# Patient Record
Sex: Female | Born: 1940
Health system: Southern US, Community
[De-identification: ages and names within clinical notes are randomized; demographics above are authoritative.]

## PROBLEM LIST (undated history)

## (undated) DIAGNOSIS — I781 Nevus, non-neoplastic: Secondary | ICD-10-CM

## (undated) DIAGNOSIS — G629 Polyneuropathy, unspecified: Secondary | ICD-10-CM

## (undated) DIAGNOSIS — M069 Rheumatoid arthritis, unspecified: Secondary | ICD-10-CM

## (undated) DIAGNOSIS — D649 Anemia, unspecified: Secondary | ICD-10-CM

## (undated) DIAGNOSIS — E119 Type 2 diabetes mellitus without complications: Secondary | ICD-10-CM

## (undated) DIAGNOSIS — N189 Chronic kidney disease, unspecified: Secondary | ICD-10-CM

## (undated) DIAGNOSIS — C801 Malignant (primary) neoplasm, unspecified: Secondary | ICD-10-CM

## (undated) DIAGNOSIS — Z950 Presence of cardiac pacemaker: Secondary | ICD-10-CM

## (undated) DIAGNOSIS — K922 Gastrointestinal hemorrhage, unspecified: Secondary | ICD-10-CM

## (undated) DIAGNOSIS — R159 Full incontinence of feces: Secondary | ICD-10-CM

## (undated) DIAGNOSIS — H409 Unspecified glaucoma: Secondary | ICD-10-CM

## (undated) DIAGNOSIS — I10 Essential (primary) hypertension: Secondary | ICD-10-CM

## (undated) DIAGNOSIS — E785 Hyperlipidemia, unspecified: Secondary | ICD-10-CM

## (undated) DIAGNOSIS — M1990 Unspecified osteoarthritis, unspecified site: Secondary | ICD-10-CM

## (undated) HISTORY — DX: Anemia, unspecified: D64.9

## (undated) HISTORY — PX: CHOLECYSTECTOMY: SHX55

## (undated) HISTORY — PX: COLONOSCOPY: SHX174

## (undated) HISTORY — PX: EXCISIONAL HEMORRHOIDECTOMY: SHX1541

## (undated) HISTORY — PX: BREAST SURGERY: SHX581

## (undated) HISTORY — DX: Gastrointestinal hemorrhage, unspecified: K92.2

---

## 2006-02-27 ENCOUNTER — Inpatient Hospital Stay: Payer: Self-pay

## 2006-02-27 ENCOUNTER — Other Ambulatory Visit: Payer: Self-pay

## 2006-04-18 ENCOUNTER — Ambulatory Visit: Payer: Self-pay | Admitting: Gastroenterology

## 2008-12-03 ENCOUNTER — Ambulatory Visit: Payer: Self-pay | Admitting: Rheumatology

## 2012-07-17 DIAGNOSIS — Z853 Personal history of malignant neoplasm of breast: Secondary | ICD-10-CM | POA: Insufficient documentation

## 2012-10-29 ENCOUNTER — Ambulatory Visit: Payer: Self-pay | Admitting: Family Medicine

## 2012-11-11 ENCOUNTER — Ambulatory Visit: Payer: Self-pay | Admitting: Family Medicine

## 2014-01-22 DIAGNOSIS — E785 Hyperlipidemia, unspecified: Secondary | ICD-10-CM | POA: Insufficient documentation

## 2014-08-04 DIAGNOSIS — E782 Mixed hyperlipidemia: Secondary | ICD-10-CM | POA: Diagnosis not present

## 2014-08-04 DIAGNOSIS — I10 Essential (primary) hypertension: Secondary | ICD-10-CM | POA: Diagnosis not present

## 2014-08-04 DIAGNOSIS — E119 Type 2 diabetes mellitus without complications: Secondary | ICD-10-CM | POA: Insufficient documentation

## 2014-08-04 DIAGNOSIS — E1165 Type 2 diabetes mellitus with hyperglycemia: Secondary | ICD-10-CM | POA: Diagnosis not present

## 2014-08-04 DIAGNOSIS — IMO0002 Reserved for concepts with insufficient information to code with codable children: Secondary | ICD-10-CM | POA: Insufficient documentation

## 2014-08-04 DIAGNOSIS — E1149 Type 2 diabetes mellitus with other diabetic neurological complication: Secondary | ICD-10-CM | POA: Insufficient documentation

## 2014-08-04 DIAGNOSIS — D508 Other iron deficiency anemias: Secondary | ICD-10-CM | POA: Diagnosis not present

## 2014-10-18 DIAGNOSIS — H524 Presbyopia: Secondary | ICD-10-CM | POA: Diagnosis not present

## 2014-10-18 DIAGNOSIS — H5210 Myopia, unspecified eye: Secondary | ICD-10-CM | POA: Diagnosis not present

## 2014-11-05 DIAGNOSIS — I10 Essential (primary) hypertension: Secondary | ICD-10-CM | POA: Diagnosis not present

## 2014-11-05 DIAGNOSIS — E1149 Type 2 diabetes mellitus with other diabetic neurological complication: Secondary | ICD-10-CM | POA: Diagnosis not present

## 2014-11-05 DIAGNOSIS — E782 Mixed hyperlipidemia: Secondary | ICD-10-CM | POA: Diagnosis not present

## 2014-11-05 DIAGNOSIS — E1165 Type 2 diabetes mellitus with hyperglycemia: Secondary | ICD-10-CM | POA: Diagnosis not present

## 2014-11-05 DIAGNOSIS — D508 Other iron deficiency anemias: Secondary | ICD-10-CM | POA: Diagnosis not present

## 2014-11-10 DIAGNOSIS — L089 Local infection of the skin and subcutaneous tissue, unspecified: Secondary | ICD-10-CM | POA: Diagnosis not present

## 2014-11-10 DIAGNOSIS — L308 Other specified dermatitis: Secondary | ICD-10-CM | POA: Diagnosis not present

## 2014-11-24 DIAGNOSIS — L918 Other hypertrophic disorders of the skin: Secondary | ICD-10-CM | POA: Diagnosis not present

## 2014-11-24 DIAGNOSIS — L2089 Other atopic dermatitis: Secondary | ICD-10-CM | POA: Diagnosis not present

## 2015-02-15 DIAGNOSIS — E782 Mixed hyperlipidemia: Secondary | ICD-10-CM | POA: Diagnosis not present

## 2015-02-15 DIAGNOSIS — D508 Other iron deficiency anemias: Secondary | ICD-10-CM | POA: Diagnosis not present

## 2015-02-15 DIAGNOSIS — I10 Essential (primary) hypertension: Secondary | ICD-10-CM | POA: Diagnosis not present

## 2015-02-15 DIAGNOSIS — E1165 Type 2 diabetes mellitus with hyperglycemia: Secondary | ICD-10-CM | POA: Diagnosis not present

## 2015-02-15 DIAGNOSIS — E1149 Type 2 diabetes mellitus with other diabetic neurological complication: Secondary | ICD-10-CM | POA: Diagnosis not present

## 2015-02-21 DIAGNOSIS — Z1283 Encounter for screening for malignant neoplasm of skin: Secondary | ICD-10-CM | POA: Diagnosis not present

## 2015-02-21 DIAGNOSIS — Q808 Other congenital ichthyosis: Secondary | ICD-10-CM | POA: Diagnosis not present

## 2015-02-28 DIAGNOSIS — D508 Other iron deficiency anemias: Secondary | ICD-10-CM | POA: Diagnosis not present

## 2015-03-28 DIAGNOSIS — Z4431 Encounter for fitting and adjustment of external right breast prosthesis: Secondary | ICD-10-CM | POA: Diagnosis not present

## 2015-03-28 DIAGNOSIS — C50111 Malignant neoplasm of central portion of right female breast: Secondary | ICD-10-CM | POA: Diagnosis not present

## 2015-04-11 DIAGNOSIS — D649 Anemia, unspecified: Secondary | ICD-10-CM | POA: Diagnosis not present

## 2015-05-18 DIAGNOSIS — D508 Other iron deficiency anemias: Secondary | ICD-10-CM | POA: Diagnosis not present

## 2015-05-18 DIAGNOSIS — Z794 Long term (current) use of insulin: Secondary | ICD-10-CM | POA: Diagnosis not present

## 2015-05-18 DIAGNOSIS — E782 Mixed hyperlipidemia: Secondary | ICD-10-CM | POA: Diagnosis not present

## 2015-05-18 DIAGNOSIS — Z23 Encounter for immunization: Secondary | ICD-10-CM | POA: Diagnosis not present

## 2015-05-18 DIAGNOSIS — E1142 Type 2 diabetes mellitus with diabetic polyneuropathy: Secondary | ICD-10-CM | POA: Diagnosis not present

## 2015-05-18 DIAGNOSIS — I10 Essential (primary) hypertension: Secondary | ICD-10-CM | POA: Diagnosis not present

## 2015-05-26 DIAGNOSIS — D508 Other iron deficiency anemias: Secondary | ICD-10-CM | POA: Diagnosis not present

## 2015-08-05 DIAGNOSIS — L309 Dermatitis, unspecified: Secondary | ICD-10-CM | POA: Diagnosis not present

## 2015-08-18 DIAGNOSIS — Z794 Long term (current) use of insulin: Secondary | ICD-10-CM | POA: Diagnosis not present

## 2015-08-18 DIAGNOSIS — I10 Essential (primary) hypertension: Secondary | ICD-10-CM | POA: Diagnosis not present

## 2015-08-18 DIAGNOSIS — E782 Mixed hyperlipidemia: Secondary | ICD-10-CM | POA: Diagnosis not present

## 2015-08-18 DIAGNOSIS — L299 Pruritus, unspecified: Secondary | ICD-10-CM | POA: Diagnosis not present

## 2015-08-18 DIAGNOSIS — E1142 Type 2 diabetes mellitus with diabetic polyneuropathy: Secondary | ICD-10-CM | POA: Diagnosis not present

## 2015-08-18 DIAGNOSIS — D508 Other iron deficiency anemias: Secondary | ICD-10-CM | POA: Diagnosis not present

## 2015-08-22 DIAGNOSIS — L299 Pruritus, unspecified: Secondary | ICD-10-CM | POA: Diagnosis not present

## 2015-08-22 DIAGNOSIS — Z794 Long term (current) use of insulin: Secondary | ICD-10-CM | POA: Diagnosis not present

## 2015-08-22 DIAGNOSIS — E1142 Type 2 diabetes mellitus with diabetic polyneuropathy: Secondary | ICD-10-CM | POA: Diagnosis not present

## 2015-08-22 DIAGNOSIS — E782 Mixed hyperlipidemia: Secondary | ICD-10-CM | POA: Diagnosis not present

## 2015-09-12 DIAGNOSIS — R7989 Other specified abnormal findings of blood chemistry: Secondary | ICD-10-CM | POA: Diagnosis not present

## 2015-09-19 DIAGNOSIS — Q800 Ichthyosis vulgaris: Secondary | ICD-10-CM | POA: Diagnosis not present

## 2015-10-19 DIAGNOSIS — H5213 Myopia, bilateral: Secondary | ICD-10-CM | POA: Diagnosis not present

## 2015-10-19 DIAGNOSIS — H5210 Myopia, unspecified eye: Secondary | ICD-10-CM | POA: Diagnosis not present

## 2015-11-21 DIAGNOSIS — I10 Essential (primary) hypertension: Secondary | ICD-10-CM | POA: Diagnosis not present

## 2015-11-21 DIAGNOSIS — E1142 Type 2 diabetes mellitus with diabetic polyneuropathy: Secondary | ICD-10-CM | POA: Diagnosis not present

## 2015-11-21 DIAGNOSIS — E782 Mixed hyperlipidemia: Secondary | ICD-10-CM | POA: Diagnosis not present

## 2015-11-21 DIAGNOSIS — L821 Other seborrheic keratosis: Secondary | ICD-10-CM | POA: Diagnosis not present

## 2015-11-21 DIAGNOSIS — D508 Other iron deficiency anemias: Secondary | ICD-10-CM | POA: Diagnosis not present

## 2015-11-21 DIAGNOSIS — Q800 Ichthyosis vulgaris: Secondary | ICD-10-CM | POA: Diagnosis not present

## 2015-11-21 DIAGNOSIS — L299 Pruritus, unspecified: Secondary | ICD-10-CM | POA: Diagnosis not present

## 2015-11-21 DIAGNOSIS — Z794 Long term (current) use of insulin: Secondary | ICD-10-CM | POA: Diagnosis not present

## 2015-12-19 DIAGNOSIS — Q800 Ichthyosis vulgaris: Secondary | ICD-10-CM | POA: Diagnosis not present

## 2015-12-28 DIAGNOSIS — Z4431 Encounter for fitting and adjustment of external right breast prosthesis: Secondary | ICD-10-CM | POA: Diagnosis not present

## 2015-12-28 DIAGNOSIS — C50111 Malignant neoplasm of central portion of right female breast: Secondary | ICD-10-CM | POA: Diagnosis not present

## 2016-02-21 DIAGNOSIS — L918 Other hypertrophic disorders of the skin: Secondary | ICD-10-CM | POA: Diagnosis not present

## 2016-02-21 DIAGNOSIS — L853 Xerosis cutis: Secondary | ICD-10-CM | POA: Diagnosis not present

## 2016-02-21 DIAGNOSIS — Z1283 Encounter for screening for malignant neoplasm of skin: Secondary | ICD-10-CM | POA: Diagnosis not present

## 2016-02-23 DIAGNOSIS — L299 Pruritus, unspecified: Secondary | ICD-10-CM | POA: Diagnosis not present

## 2016-02-23 DIAGNOSIS — E1142 Type 2 diabetes mellitus with diabetic polyneuropathy: Secondary | ICD-10-CM | POA: Diagnosis not present

## 2016-02-23 DIAGNOSIS — I10 Essential (primary) hypertension: Secondary | ICD-10-CM | POA: Diagnosis not present

## 2016-02-23 DIAGNOSIS — M7582 Other shoulder lesions, left shoulder: Secondary | ICD-10-CM | POA: Diagnosis not present

## 2016-02-23 DIAGNOSIS — D508 Other iron deficiency anemias: Secondary | ICD-10-CM | POA: Diagnosis not present

## 2016-02-23 DIAGNOSIS — E782 Mixed hyperlipidemia: Secondary | ICD-10-CM | POA: Diagnosis not present

## 2016-02-23 DIAGNOSIS — Z794 Long term (current) use of insulin: Secondary | ICD-10-CM | POA: Diagnosis not present

## 2016-03-07 DIAGNOSIS — Z794 Long term (current) use of insulin: Secondary | ICD-10-CM | POA: Diagnosis not present

## 2016-03-07 DIAGNOSIS — D508 Other iron deficiency anemias: Secondary | ICD-10-CM | POA: Diagnosis not present

## 2016-03-07 DIAGNOSIS — E1165 Type 2 diabetes mellitus with hyperglycemia: Secondary | ICD-10-CM | POA: Diagnosis not present

## 2016-04-06 DIAGNOSIS — D509 Iron deficiency anemia, unspecified: Secondary | ICD-10-CM | POA: Diagnosis not present

## 2016-04-06 DIAGNOSIS — Z23 Encounter for immunization: Secondary | ICD-10-CM | POA: Diagnosis not present

## 2016-04-06 DIAGNOSIS — E1165 Type 2 diabetes mellitus with hyperglycemia: Secondary | ICD-10-CM | POA: Diagnosis not present

## 2016-04-06 DIAGNOSIS — Z794 Long term (current) use of insulin: Secondary | ICD-10-CM | POA: Diagnosis not present

## 2016-04-11 NOTE — Progress Notes (Signed)
[place]  [phone] [phone]  ID: [name] OB: Jan 22, 1941  MR#: [id number]  KMQ#:[id number]  No care team member to display  CHIEF COMPLAINT: Iron deficiency anemia.  INTERVAL HISTORY: Patient is a 75 year old female who was noted to have a declining hemoglobin as well as iron stores on routine blood work. She also has a distant history of breast cancer treated with chemotherapy at [facility] in the late 1990s. Currently, she is weak and fatigued but otherwise feels well. She has no neurologic complaints. She denies any recent fevers or illnesses. She has a fair appetite, but denies weight loss. She has no chest pain or shortness of breath. She denies any nausea, vomiting, constipation, or diarrhea. She has no melena or hematochezia. She has no urinary complaints. Patient offers no further specific complaints today.  REVIEW OF SYSTEMS:   Review of Systems  Constitutional: Positive for malaise/fatigue. Negative for fever and weight loss.  Respiratory: Negative.  Negative for cough and shortness of breath.   Cardiovascular: Negative.  Negative for chest pain and leg swelling.  Gastrointestinal: Negative.  Negative for abdominal pain.  Genitourinary: Negative.   Musculoskeletal: Negative.   Neurological: Positive for weakness.  Psychiatric/Behavioral: Negative.  The patient is not nervous/anxious.    As per HPI. Otherwise, a complete review of systems is negative.  PAST MEDICAL HISTORY: No past medical history on file.  PAST SURGICAL HISTORY: No past surgical history on file.  FAMILY HISTORY: Reviewed and unchanged. No reported history of malignancy or chronic disease.  ADVANCED DIRECTIVES (Y/N):  N  HEALTH MAINTENANCE: Social History  Substance Use Topics  . Smoking status: Not on file  . Smokeless tobacco: Not on file  . Alcohol use Not on file     Colonoscopy:  PAP:  Bone density:  Lipid panel:  No Known  Allergies  Current Outpatient Prescriptions  Medication Sig Dispense Refill  . aspirin 81 MG EC tablet Take 81 mg by mouth daily. Swallow whole.    . enalapril (VASOTEC) 20 MG tablet Take 20 mg by mouth daily.    . Ergocalciferol (VITAMIN D2) 2000 units TABS Take 1 tablet by mouth daily.    . ferrous fumarate (HEMOCYTE - 106 MG FE) 325 (106 Fe) MG TABS tablet Take 1 tablet by mouth daily.    Marland Kitchen gabapentin (NEURONTIN) 300 MG capsule Take 600 mg by mouth 3 (three) times daily.    Marland Kitchen glipiZIDE (GLUCOTROL) 10 MG tablet Take 10 mg by mouth daily before breakfast.    . glipiZIDE (GLUCOTROL) 5 MG tablet Take 5 mg by mouth every evening.    . hydrOXYzine (ATARAX/VISTARIL) 25 MG tablet Take 25 mg by mouth 3 (three) times daily as needed. Take 1-2 tabs at bedtime.    . insulin glargine (LANTUS) 100 UNIT/ML injection Inject 20-22 Units into the skin daily.    Marland Kitchen loratadine (CLARITIN) 10 MG tablet Take 10 mg by mouth daily.    . metFORMIN (GLUCOPHAGE) 850 MG tablet Take 850 mg by mouth 3 (three) times daily.    . niacin 500 MG CR capsule Take 500 mg by mouth at bedtime.    . Omega-3 Fatty Acids (FISH OIL PO) Take 1 tablet by mouth.    . simvastatin (ZOCOR) 40 MG tablet Take 40 mg by mouth daily at 6 PM.    . sitaGLIPtin (JANUVIA) 50 MG tablet Take 50 mg by mouth daily.    . travoprost, benzalkonium, (TRAVATAN) 0.004 % ophthalmic solution 1 drop  at bedtime.    . vitamin B-12 (CYANOCOBALAMIN) 1000 MCG tablet Take 1,000 mcg by mouth daily.     No current facility-administered medications for this visit.     OBJECTIVE: Vitals:   04/13/16 0921  BP: (!) 148/71  Pulse: (!) 103  Resp: 18  Temp: 98 F (36.7 C)     Body mass index is 29.28 kg/m.    ECOG FS:1 - Symptomatic but completely ambulatory  General: Well-developed, well-nourished, no acute distress. Eyes: Pink conjunctiva, anicteric sclera. HEENT: Normocephalic, moist mucous membranes, clear oropharnyx. Lungs: Clear to auscultation  bilaterally. Heart: Regular rate and rhythm. No rubs, murmurs, or gallops. Abdomen: Soft, nontender, nondistended. No organomegaly noted, normoactive bowel sounds. Musculoskeletal: No edema, cyanosis, or clubbing. Neuro: Alert, answering all questions appropriately. Cranial nerves grossly intact. Skin: No rashes or petechiae noted. Psych: Normal affect. Lymphatics: No cervical, calvicular, axillary or inguinal LAD.   LAB RESULTS:  No results found for: NA, K, CL, CO2, GLUCOSE, BUN, CREATININE, CALCIUM, PROT, ALBUMIN, AST, ALT, ALKPHOS, BILITOT, GFRNONAA, GFRAA  No results found for: WBC, NEUTROABS, HGB, HCT, MCV, PLT   STUDIES: No results found.  ASSESSMENT: Iron deficiency anemia.  PLAN:    1. Iron deficiency anemia: Patient's most recent laboratory work on April 06, 2016 reported a hemoglobin of 7.4, total iron of 11, percent saturation of 2% and a ferritin of 9. Patient is also symptomatic. Return to clinic in 1 and 2 weeks for 510 mg IV Feraheme. Patient will then return to clinic in 3 months for repeat laboratory work and further evaluation. Will also do a more extensive anemia workup at that time. 2. Hypertension: Patient's blood pressure is mildly elevated today. Continue current medications as prescribed.  Patient expressed understanding and was in agreement with this plan. She also understands that She can call clinic at any time with any questions, concerns, or complaints.    Lloyd Huger, MD   04/15/2016 9:08 AM

## 2016-04-13 ENCOUNTER — Inpatient Hospital Stay: Payer: Commercial Managed Care - HMO | Attending: Oncology | Admitting: Oncology

## 2016-04-13 VITALS — BP 148/71 | HR 103 | Temp 98.0°F | Resp 18 | Ht 64.5 in | Wt 173.3 lb

## 2016-04-13 DIAGNOSIS — D509 Iron deficiency anemia, unspecified: Secondary | ICD-10-CM | POA: Diagnosis not present

## 2016-04-13 DIAGNOSIS — Z853 Personal history of malignant neoplasm of breast: Secondary | ICD-10-CM | POA: Diagnosis not present

## 2016-04-13 DIAGNOSIS — I10 Essential (primary) hypertension: Secondary | ICD-10-CM | POA: Diagnosis not present

## 2016-04-13 DIAGNOSIS — Z79899 Other long term (current) drug therapy: Secondary | ICD-10-CM | POA: Diagnosis not present

## 2016-04-13 DIAGNOSIS — Z7982 Long term (current) use of aspirin: Secondary | ICD-10-CM | POA: Insufficient documentation

## 2016-04-13 DIAGNOSIS — D508 Other iron deficiency anemias: Secondary | ICD-10-CM

## 2016-04-13 DIAGNOSIS — D649 Anemia, unspecified: Secondary | ICD-10-CM

## 2016-04-13 NOTE — Progress Notes (Signed)
New evaluation for anemia. States is feeling tired with decreased energy levels.

## 2016-04-15 DIAGNOSIS — D509 Iron deficiency anemia, unspecified: Secondary | ICD-10-CM | POA: Insufficient documentation

## 2016-04-19 ENCOUNTER — Inpatient Hospital Stay: Payer: Commercial Managed Care - HMO

## 2016-04-19 ENCOUNTER — Other Ambulatory Visit: Payer: Self-pay | Admitting: Oncology

## 2016-04-19 VITALS — BP 113/63 | HR 94 | Temp 98.1°F | Resp 20

## 2016-04-19 DIAGNOSIS — Z7982 Long term (current) use of aspirin: Secondary | ICD-10-CM | POA: Diagnosis not present

## 2016-04-19 DIAGNOSIS — I10 Essential (primary) hypertension: Secondary | ICD-10-CM | POA: Diagnosis not present

## 2016-04-19 DIAGNOSIS — D508 Other iron deficiency anemias: Secondary | ICD-10-CM

## 2016-04-19 DIAGNOSIS — Z853 Personal history of malignant neoplasm of breast: Secondary | ICD-10-CM | POA: Diagnosis not present

## 2016-04-19 DIAGNOSIS — D509 Iron deficiency anemia, unspecified: Secondary | ICD-10-CM | POA: Diagnosis not present

## 2016-04-19 DIAGNOSIS — Z79899 Other long term (current) drug therapy: Secondary | ICD-10-CM | POA: Diagnosis not present

## 2016-04-19 MED ORDER — SODIUM CHLORIDE 0.9 % IV SOLN
510.0000 mg | Freq: Once | INTRAVENOUS | Status: AC
  Administered 2016-04-19: 510 mg via INTRAVENOUS
  Filled 2016-04-19: qty 17

## 2016-04-19 MED ORDER — SODIUM CHLORIDE 0.9 % IV SOLN
Freq: Once | INTRAVENOUS | Status: AC
  Administered 2016-04-19: 12:00:00 via INTRAVENOUS
  Filled 2016-04-19: qty 1000

## 2016-04-26 ENCOUNTER — Inpatient Hospital Stay: Payer: Commercial Managed Care - HMO

## 2016-04-26 VITALS — BP 125/67 | HR 92 | Temp 98.2°F | Resp 20

## 2016-04-26 DIAGNOSIS — I10 Essential (primary) hypertension: Secondary | ICD-10-CM | POA: Diagnosis not present

## 2016-04-26 DIAGNOSIS — D509 Iron deficiency anemia, unspecified: Secondary | ICD-10-CM | POA: Diagnosis not present

## 2016-04-26 DIAGNOSIS — D508 Other iron deficiency anemias: Secondary | ICD-10-CM

## 2016-04-26 DIAGNOSIS — Z79899 Other long term (current) drug therapy: Secondary | ICD-10-CM | POA: Diagnosis not present

## 2016-04-26 DIAGNOSIS — Z853 Personal history of malignant neoplasm of breast: Secondary | ICD-10-CM | POA: Diagnosis not present

## 2016-04-26 DIAGNOSIS — Z7982 Long term (current) use of aspirin: Secondary | ICD-10-CM | POA: Diagnosis not present

## 2016-04-26 MED ORDER — FERUMOXYTOL INJECTION 510 MG/17 ML
510.0000 mg | Freq: Once | INTRAVENOUS | Status: AC
  Administered 2016-04-26: 510 mg via INTRAVENOUS
  Filled 2016-04-26: qty 17

## 2016-04-26 MED ORDER — SODIUM CHLORIDE 0.9 % IV SOLN
Freq: Once | INTRAVENOUS | Status: AC
  Administered 2016-04-26: 14:00:00 via INTRAVENOUS
  Filled 2016-04-26: qty 1000

## 2016-04-26 NOTE — Patient Instructions (Signed)

## 2016-05-25 DIAGNOSIS — E782 Mixed hyperlipidemia: Secondary | ICD-10-CM | POA: Diagnosis not present

## 2016-05-25 DIAGNOSIS — I10 Essential (primary) hypertension: Secondary | ICD-10-CM | POA: Diagnosis not present

## 2016-05-25 DIAGNOSIS — Z794 Long term (current) use of insulin: Secondary | ICD-10-CM | POA: Diagnosis not present

## 2016-05-25 DIAGNOSIS — E1165 Type 2 diabetes mellitus with hyperglycemia: Secondary | ICD-10-CM | POA: Diagnosis not present

## 2016-05-25 DIAGNOSIS — D508 Other iron deficiency anemias: Secondary | ICD-10-CM | POA: Diagnosis not present

## 2016-07-11 ENCOUNTER — Inpatient Hospital Stay: Payer: Commercial Managed Care - HMO | Attending: Oncology

## 2016-07-11 DIAGNOSIS — D508 Other iron deficiency anemias: Secondary | ICD-10-CM

## 2016-07-11 DIAGNOSIS — R0602 Shortness of breath: Secondary | ICD-10-CM | POA: Insufficient documentation

## 2016-07-11 DIAGNOSIS — R531 Weakness: Secondary | ICD-10-CM | POA: Insufficient documentation

## 2016-07-11 DIAGNOSIS — D509 Iron deficiency anemia, unspecified: Secondary | ICD-10-CM | POA: Insufficient documentation

## 2016-07-11 DIAGNOSIS — Z79899 Other long term (current) drug therapy: Secondary | ICD-10-CM | POA: Diagnosis not present

## 2016-07-11 LAB — CBC
HEMATOCRIT: 31.8 % — AB (ref 35.0–47.0)
HEMOGLOBIN: 10.2 g/dL — AB (ref 12.0–16.0)
MCH: 32.2 pg (ref 26.0–34.0)
MCHC: 32 g/dL (ref 32.0–36.0)
MCV: 100.5 fL — ABNORMAL HIGH (ref 80.0–100.0)
Platelets: 234 10*3/uL (ref 150–440)
RBC: 3.17 MIL/uL — ABNORMAL LOW (ref 3.80–5.20)
RDW: 17.2 % — ABNORMAL HIGH (ref 11.5–14.5)
WBC: 10.1 10*3/uL (ref 3.6–11.0)

## 2016-07-11 LAB — DAT, POLYSPECIFIC AHG (ARMC ONLY): POLYSPECIFIC AHG TEST: NEGATIVE

## 2016-07-11 LAB — IRON AND TIBC
Iron: 39 ug/dL (ref 28–170)
Saturation Ratios: 11 % (ref 10.4–31.8)
TIBC: 369 ug/dL (ref 250–450)
UIBC: 330 ug/dL

## 2016-07-11 LAB — FERRITIN: FERRITIN: 26 ng/mL (ref 11–307)

## 2016-07-11 LAB — LACTATE DEHYDROGENASE: LDH: 257 U/L — ABNORMAL HIGH (ref 98–192)

## 2016-07-11 LAB — VITAMIN B12: Vitamin B-12: 4134 pg/mL — ABNORMAL HIGH (ref 180–914)

## 2016-07-11 LAB — FOLATE: FOLATE: 8.9 ng/mL (ref >5.9)

## 2016-07-12 LAB — PROTEIN ELECTROPHORESIS, SERUM
A/G RATIO SPE: 0.9 (ref 0.7–1.7)
ALPHA-2-GLOBULIN: 0.7 g/dL (ref 0.4–1.0)
Albumin ELP: 3.3 g/dL (ref 2.9–4.4)
Alpha-1-Globulin: 0.3 g/dL (ref 0.0–0.4)
Beta Globulin: 1.1 g/dL (ref 0.7–1.3)
Gamma Globulin: 1.5 g/dL (ref 0.4–1.8)
Globulin, Total: 3.6 g/dL (ref 2.2–3.9)
PDF SPE: 0
TOTAL PROTEIN ELP: 6.9 g/dL (ref 6.0–8.5)

## 2016-07-12 LAB — HAPTOGLOBIN: HAPTOGLOBIN: 126 mg/dL (ref 34–200)

## 2016-07-12 NOTE — Progress Notes (Signed)
Emily Faulkner  [phone] [phone]  ID: Emily Faulkner OB: Jul 15, 1940  MR#: [id number]  IRS#:[id number]  No care team member to display  CHIEF COMPLAINT: Iron deficiency anemia.  INTERVAL HISTORY: Patient returns to clinic today for repeat laboratory work and further evaluation. Currently, she is weak and fatigued but otherwise feels well, reporting no improvement after recent IV iron supplementation. She reports slight tremors at baseline, no other neurologic complaints. She denies any recent fevers or illnesses. She denies any pain.  Her appetite is reduced and she reports slight weight loss. She reports shortness of breath with exertion but has no chest pain. She reports chronic diarrhea, occasionally taking imodium to control.  She denies any nausea, vomiting, or constipation. She has no melena or hematochezia. She has no urinary complaints. She reports 2 month history of dry skin, thinning hair, feeling cold and sleepy, and being more forgetful, often losing her train of thought. Patient offers no further specific complaints today.  REVIEW OF SYSTEMS:   Review of Systems  Constitutional: Positive for chills, malaise/fatigue and weight loss. Negative for fever.  HENT: Negative for congestion.   Respiratory: Positive for shortness of breath. Negative for cough.   Cardiovascular: Negative.  Negative for chest pain and leg swelling.  Gastrointestinal: Positive for diarrhea. Negative for abdominal pain, blood in stool, constipation, nausea and vomiting.  Genitourinary: Negative.  Negative for dysuria, frequency and urgency.  Musculoskeletal: Negative.  Negative for myalgias.  Neurological: Positive for tremors and weakness. Negative for tingling and headaches.  Psychiatric/Behavioral: Positive for memory loss. The patient is not nervous/anxious and does not have insomnia.    As per HPI. Otherwise, a complete review of systems is negative.  PAST  MEDICAL HISTORY: No past medical history on file.  PAST SURGICAL HISTORY: No past surgical history on file.  FAMILY HISTORY: Reviewed and unchanged. No reported history of malignancy or chronic disease.  ADVANCED DIRECTIVES (Y/N):  N  HEALTH MAINTENANCE: Social History  Substance Use Topics  . Smoking status: Not on file  . Smokeless tobacco: Not on file  . Alcohol use Not on file     Colonoscopy:  PAP:  Bone density:  Lipid panel:  No Known Allergies  Current Outpatient Prescriptions  Medication Sig Dispense Refill  . aspirin 81 MG EC tablet Take 81 mg by mouth daily. Swallow whole.    . enalapril (VASOTEC) 20 MG tablet Take 20 mg by mouth daily.    . Ergocalciferol (VITAMIN D2) 2000 units TABS Take 1 tablet by mouth daily.    . ferrous fumarate (HEMOCYTE - 106 MG FE) 325 (106 Fe) MG TABS tablet Take 1 tablet by mouth daily.    Marland Kitchen gabapentin (NEURONTIN) 300 MG capsule Take 600 mg by mouth 3 (three) times daily.    Marland Kitchen glipiZIDE (GLUCOTROL) 10 MG tablet Take 10 mg by mouth daily before breakfast.    . glipiZIDE (GLUCOTROL) 5 MG tablet Take 5 mg by mouth every evening.    . hydrOXYzine (ATARAX/VISTARIL) 25 MG tablet Take 25 mg by mouth 3 (three) times daily as needed. Take 1-2 tabs at bedtime.    . insulin glargine (LANTUS) 100 UNIT/ML injection Inject 20-22 Units into the skin daily.    Marland Kitchen loratadine (CLARITIN) 10 MG tablet Take 10 mg by mouth daily.    . metFORMIN (GLUCOPHAGE) 850 MG tablet Take 850 mg by mouth 3 (three) times daily.    . niacin 500 MG CR capsule Take 500 mg by mouth  at bedtime.    . Omega-3 Fatty Acids (FISH OIL PO) Take 1 tablet by mouth.    . simvastatin (ZOCOR) 40 MG tablet Take 40 mg by mouth daily at 6 PM.    . sitaGLIPtin (JANUVIA) 50 MG tablet Take 50 mg by mouth daily.    . travoprost, benzalkonium, (TRAVATAN) 0.004 % ophthalmic solution 1 drop at bedtime.    . vitamin B-12 (CYANOCOBALAMIN) 1000 MCG tablet Take 1,000 mcg by mouth daily.     No  current facility-administered medications for this visit.     OBJECTIVE: Vitals:   07/13/16 1055  BP: (!) 125/53  Pulse: (!) 108  Resp: 18  Temp: 98.1 F (36.7 C)     Body mass index is 27.92 kg/m.    ECOG FS:1 - Symptomatic but completely ambulatory  General: Well-developed, well-nourished, no acute distress. Eyes: Pink conjunctiva, anicteric sclera. HEENT: Normocephalic, moist mucous membranes, clear oropharnyx. Lungs: Clear to auscultation bilaterally. Heart: Regular rate and rhythm. No rubs, murmurs, or gallops. Abdomen: Soft, nontender, nondistended. No organomegaly noted, normoactive bowel sounds. Musculoskeletal: No edema, cyanosis, or clubbing. Neuro: Alert, answering all questions appropriately. Cranial nerves grossly intact. Skin: No rashes or petechiae noted. Psych: Normal affect. Lymphatics: No cervical, calvicular, axillary or inguinal LAD.   LAB RESULTS:  No results found for: NA, K, CL, CO2, GLUCOSE, BUN, CREATININE, CALCIUM, PROT, ALBUMIN, AST, ALT, ALKPHOS, BILITOT, GFRNONAA, GFRAA  Lab Results  Component Value Date   WBC 10.1 07/11/2016   HGB 10.2 (L) 07/11/2016   HCT 31.8 (L) 07/11/2016   MCV 100.5 (H) 07/11/2016   PLT 234 07/11/2016   Lab Results  Component Value Date   IRON 39 07/11/2016   TIBC 369 07/11/2016   IRONPCTSAT 11 07/11/2016   Lab Results  Component Value Date   FERRITIN 26 07/11/2016    STUDIES: No results found.  ASSESSMENT: Iron deficiency anemia.  PLAN:    1. Iron deficiency anemia: Currently resolved. Patient does not require additional IV iron today. Return to clinic in 3 months for repeat laboratory work and further evaluation.  2. Hypertension: Currently resolved. Patient's blood pressure is mildly low today. Discussed drinking more fluids.  Continue current medications as prescribed. 3. Fatigue/Hair thinning/Cold intolerance: Draw TSH, thryoid panel labs today, forward results to PCP for follow-up.   Patient  expressed understanding and was in agreement with this plan. She also understands that She can call clinic at any time with any questions, concerns, or complaints.    Lucendia Herrlich, NP   07/13/2016  12:34 PM  Patient was seen and evaluated independently and I agree with the assessment and plan as dictated above. Patient last received IV Feraheme on April 26, 2016.  Lloyd Huger, MD 07/13/16 3:06 PM

## 2016-07-13 ENCOUNTER — Inpatient Hospital Stay: Payer: Commercial Managed Care - HMO

## 2016-07-13 ENCOUNTER — Inpatient Hospital Stay (HOSPITAL_BASED_OUTPATIENT_CLINIC_OR_DEPARTMENT_OTHER): Payer: Commercial Managed Care - HMO | Admitting: Oncology

## 2016-07-13 VITALS — BP 125/53 | HR 108 | Temp 98.1°F | Resp 18 | Wt 165.2 lb

## 2016-07-13 DIAGNOSIS — R0602 Shortness of breath: Secondary | ICD-10-CM

## 2016-07-13 DIAGNOSIS — Z79899 Other long term (current) drug therapy: Secondary | ICD-10-CM

## 2016-07-13 DIAGNOSIS — R531 Weakness: Secondary | ICD-10-CM | POA: Diagnosis not present

## 2016-07-13 DIAGNOSIS — R5383 Other fatigue: Secondary | ICD-10-CM

## 2016-07-13 DIAGNOSIS — D508 Other iron deficiency anemias: Secondary | ICD-10-CM

## 2016-07-13 DIAGNOSIS — D509 Iron deficiency anemia, unspecified: Secondary | ICD-10-CM

## 2016-07-13 NOTE — Progress Notes (Signed)
Complains of continuing to feel weak and fatigued despite 2 doses of iron.

## 2016-07-14 LAB — T4: T4 TOTAL: 6.7 ug/dL (ref 4.5–12.0)

## 2016-07-14 LAB — TSH: TSH: 3.526 u[IU]/mL (ref 0.350–4.500)

## 2016-07-31 ENCOUNTER — Telehealth: Payer: Self-pay | Admitting: *Deleted

## 2016-07-31 NOTE — Telephone Encounter (Signed)
patient requesting results

## 2016-07-31 NOTE — Telephone Encounter (Signed)
Thyroid panel within normal limits.

## 2016-08-01 NOTE — Telephone Encounter (Signed)
Patient informed of normal lab results

## 2016-08-10 ENCOUNTER — Other Ambulatory Visit: Payer: Self-pay | Admitting: Gastroenterology

## 2016-08-10 DIAGNOSIS — R1084 Generalized abdominal pain: Secondary | ICD-10-CM

## 2016-08-10 DIAGNOSIS — D509 Iron deficiency anemia, unspecified: Secondary | ICD-10-CM | POA: Diagnosis not present

## 2016-08-10 DIAGNOSIS — R197 Diarrhea, unspecified: Secondary | ICD-10-CM | POA: Diagnosis not present

## 2016-08-13 ENCOUNTER — Encounter: Payer: Self-pay | Admitting: *Deleted

## 2016-08-14 ENCOUNTER — Ambulatory Visit: Payer: Medicare HMO | Admitting: Anesthesiology

## 2016-08-14 ENCOUNTER — Encounter
Admission: RE | Disposition: A | Discharge status: Home / Self Care | Payer: Self-pay | Source: Ambulatory Visit | Attending: Gastroenterology

## 2016-08-14 ENCOUNTER — Ambulatory Visit
Admission: RE | Admit: 2016-08-14 | Discharge: 2016-08-14 | Disposition: A | Discharge status: Home / Self Care | Payer: Medicare HMO | Source: Ambulatory Visit | Attending: Gastroenterology | Admitting: Gastroenterology

## 2016-08-14 DIAGNOSIS — K2950 Unspecified chronic gastritis without bleeding: Secondary | ICD-10-CM | POA: Diagnosis not present

## 2016-08-14 DIAGNOSIS — K449 Diaphragmatic hernia without obstruction or gangrene: Secondary | ICD-10-CM | POA: Insufficient documentation

## 2016-08-14 DIAGNOSIS — K224 Dyskinesia of esophagus: Secondary | ICD-10-CM | POA: Insufficient documentation

## 2016-08-14 DIAGNOSIS — E538 Deficiency of other specified B group vitamins: Secondary | ICD-10-CM | POA: Insufficient documentation

## 2016-08-14 DIAGNOSIS — D509 Iron deficiency anemia, unspecified: Secondary | ICD-10-CM | POA: Insufficient documentation

## 2016-08-14 DIAGNOSIS — B3781 Candidal esophagitis: Secondary | ICD-10-CM | POA: Insufficient documentation

## 2016-08-14 DIAGNOSIS — K621 Rectal polyp: Secondary | ICD-10-CM | POA: Insufficient documentation

## 2016-08-14 DIAGNOSIS — M069 Rheumatoid arthritis, unspecified: Secondary | ICD-10-CM | POA: Insufficient documentation

## 2016-08-14 DIAGNOSIS — K2980 Duodenitis without bleeding: Secondary | ICD-10-CM | POA: Diagnosis not present

## 2016-08-14 DIAGNOSIS — K222 Esophageal obstruction: Secondary | ICD-10-CM | POA: Insufficient documentation

## 2016-08-14 DIAGNOSIS — K2970 Gastritis, unspecified, without bleeding: Secondary | ICD-10-CM | POA: Diagnosis not present

## 2016-08-14 DIAGNOSIS — K210 Gastro-esophageal reflux disease with esophagitis: Secondary | ICD-10-CM | POA: Diagnosis not present

## 2016-08-14 DIAGNOSIS — K2210 Ulcer of esophagus without bleeding: Secondary | ICD-10-CM | POA: Insufficient documentation

## 2016-08-14 DIAGNOSIS — Z853 Personal history of malignant neoplasm of breast: Secondary | ICD-10-CM | POA: Diagnosis not present

## 2016-08-14 DIAGNOSIS — Q438 Other specified congenital malformations of intestine: Secondary | ICD-10-CM | POA: Diagnosis not present

## 2016-08-14 DIAGNOSIS — E785 Hyperlipidemia, unspecified: Secondary | ICD-10-CM | POA: Insufficient documentation

## 2016-08-14 DIAGNOSIS — R197 Diarrhea, unspecified: Secondary | ICD-10-CM | POA: Diagnosis not present

## 2016-08-14 DIAGNOSIS — I10 Essential (primary) hypertension: Secondary | ICD-10-CM | POA: Insufficient documentation

## 2016-08-14 DIAGNOSIS — E1140 Type 2 diabetes mellitus with diabetic neuropathy, unspecified: Secondary | ICD-10-CM | POA: Diagnosis not present

## 2016-08-14 DIAGNOSIS — Z803 Family history of malignant neoplasm of breast: Secondary | ICD-10-CM | POA: Insufficient documentation

## 2016-08-14 DIAGNOSIS — K5730 Diverticulosis of large intestine without perforation or abscess without bleeding: Secondary | ICD-10-CM | POA: Diagnosis not present

## 2016-08-14 DIAGNOSIS — K208 Other esophagitis: Secondary | ICD-10-CM | POA: Diagnosis not present

## 2016-08-14 DIAGNOSIS — K5790 Diverticulosis of intestine, part unspecified, without perforation or abscess without bleeding: Secondary | ICD-10-CM | POA: Diagnosis not present

## 2016-08-14 DIAGNOSIS — Z833 Family history of diabetes mellitus: Secondary | ICD-10-CM | POA: Insufficient documentation

## 2016-08-14 DIAGNOSIS — K228 Other specified diseases of esophagus: Secondary | ICD-10-CM | POA: Diagnosis not present

## 2016-08-14 DIAGNOSIS — B9681 Helicobacter pylori [H. pylori] as the cause of diseases classified elsewhere: Secondary | ICD-10-CM | POA: Insufficient documentation

## 2016-08-14 DIAGNOSIS — Z800 Family history of malignant neoplasm of digestive organs: Secondary | ICD-10-CM | POA: Diagnosis not present

## 2016-08-14 DIAGNOSIS — K269 Duodenal ulcer, unspecified as acute or chronic, without hemorrhage or perforation: Secondary | ICD-10-CM | POA: Diagnosis not present

## 2016-08-14 DIAGNOSIS — Z7982 Long term (current) use of aspirin: Secondary | ICD-10-CM | POA: Insufficient documentation

## 2016-08-14 DIAGNOSIS — Z87891 Personal history of nicotine dependence: Secondary | ICD-10-CM | POA: Insufficient documentation

## 2016-08-14 DIAGNOSIS — K529 Noninfective gastroenteritis and colitis, unspecified: Secondary | ICD-10-CM | POA: Diagnosis not present

## 2016-08-14 DIAGNOSIS — Z794 Long term (current) use of insulin: Secondary | ICD-10-CM | POA: Insufficient documentation

## 2016-08-14 DIAGNOSIS — K635 Polyp of colon: Secondary | ICD-10-CM | POA: Diagnosis not present

## 2016-08-14 DIAGNOSIS — Z79899 Other long term (current) drug therapy: Secondary | ICD-10-CM | POA: Insufficient documentation

## 2016-08-14 DIAGNOSIS — K2960 Other gastritis without bleeding: Secondary | ICD-10-CM | POA: Diagnosis not present

## 2016-08-14 DIAGNOSIS — Z836 Family history of other diseases of the respiratory system: Secondary | ICD-10-CM | POA: Insufficient documentation

## 2016-08-14 DIAGNOSIS — H409 Unspecified glaucoma: Secondary | ICD-10-CM | POA: Insufficient documentation

## 2016-08-14 DIAGNOSIS — Z9049 Acquired absence of other specified parts of digestive tract: Secondary | ICD-10-CM | POA: Insufficient documentation

## 2016-08-14 DIAGNOSIS — Z801 Family history of malignant neoplasm of trachea, bronchus and lung: Secondary | ICD-10-CM | POA: Insufficient documentation

## 2016-08-14 HISTORY — DX: Malignant (primary) neoplasm, unspecified: C80.1

## 2016-08-14 HISTORY — DX: Type 2 diabetes mellitus without complications: E11.9

## 2016-08-14 HISTORY — DX: Essential (primary) hypertension: I10

## 2016-08-14 HISTORY — DX: Unspecified glaucoma: H40.9

## 2016-08-14 HISTORY — DX: Unspecified osteoarthritis, unspecified site: M19.90

## 2016-08-14 HISTORY — DX: Nevus, non-neoplastic: I78.1

## 2016-08-14 HISTORY — PX: COLONOSCOPY WITH PROPOFOL: SHX5780

## 2016-08-14 HISTORY — DX: Polyneuropathy, unspecified: G62.9

## 2016-08-14 HISTORY — DX: Rheumatoid arthritis, unspecified: M06.9

## 2016-08-14 HISTORY — DX: Hyperlipidemia, unspecified: E78.5

## 2016-08-14 HISTORY — PX: ESOPHAGOGASTRODUODENOSCOPY (EGD) WITH PROPOFOL: SHX5813

## 2016-08-14 LAB — KOH PREP: KOH Prep: NONE SEEN

## 2016-08-14 LAB — GLUCOSE, CAPILLARY: Glucose-Capillary: 201 mg/dL — ABNORMAL HIGH (ref 65–99)

## 2016-08-14 SURGERY — COLONOSCOPY WITH PROPOFOL
Anesthesia: General

## 2016-08-14 MED ORDER — PHENYLEPHRINE HCL 10 MG/ML IJ SOLN
INTRAMUSCULAR | Status: AC
  Filled 2016-08-14: qty 1

## 2016-08-14 MED ORDER — LIDOCAINE HCL (PF) 2 % IJ SOLN
INTRAMUSCULAR | Status: AC
  Filled 2016-08-14: qty 2

## 2016-08-14 MED ORDER — SODIUM CHLORIDE 0.9 % IV SOLN
INTRAVENOUS | Status: DC
  Administered 2016-08-14: 09:00:00 via INTRAVENOUS
  Administered 2016-08-14: 1000 mL via INTRAVENOUS

## 2016-08-14 MED ORDER — PHENYLEPHRINE HCL 10 MG/ML IJ SOLN
INTRAMUSCULAR | Status: DC | PRN
  Administered 2016-08-14 (×3): 100 ug via INTRAVENOUS

## 2016-08-14 MED ORDER — PROPOFOL 500 MG/50ML IV EMUL
INTRAVENOUS | Status: AC
  Filled 2016-08-14: qty 50

## 2016-08-14 MED ORDER — MIDAZOLAM HCL 5 MG/5ML IJ SOLN
INTRAMUSCULAR | Status: DC | PRN
  Administered 2016-08-14: 1 mg via INTRAVENOUS

## 2016-08-14 MED ORDER — FENTANYL CITRATE (PF) 100 MCG/2ML IJ SOLN
INTRAMUSCULAR | Status: DC | PRN
  Administered 2016-08-14: 50 ug via INTRAVENOUS

## 2016-08-14 MED ORDER — LIDOCAINE 2% (20 MG/ML) 5 ML SYRINGE
INTRAMUSCULAR | Status: DC | PRN
  Administered 2016-08-14: 40 mg via INTRAVENOUS

## 2016-08-14 MED ORDER — FENTANYL CITRATE (PF) 100 MCG/2ML IJ SOLN
INTRAMUSCULAR | Status: AC
  Filled 2016-08-14: qty 2

## 2016-08-14 MED ORDER — PROPOFOL 10 MG/ML IV BOLUS
INTRAVENOUS | Status: DC | PRN
  Administered 2016-08-14: 80 mg via INTRAVENOUS

## 2016-08-14 MED ORDER — PROPOFOL 500 MG/50ML IV EMUL
INTRAVENOUS | Status: DC | PRN
  Administered 2016-08-14: 120 ug/kg/min via INTRAVENOUS

## 2016-08-14 MED ORDER — SODIUM CHLORIDE 0.9 % IV SOLN: INTRAVENOUS | Status: DC

## 2016-08-14 MED ORDER — MIDAZOLAM HCL 2 MG/2ML IJ SOLN
INTRAMUSCULAR | Status: AC
  Filled 2016-08-14: qty 2

## 2016-08-14 NOTE — Op Note (Signed)
[facility] Patient Name: Emily Faulkner Procedure Date: 08/14/2016 8:51 AM MRN: [id number] Account #: [id number] Date of Birth: 10/15/1940 Admit Type: Outpatient Age: 76 Room: [facility] ENDO ROOM 3 Gender: Female Note Status: Finalized Procedure:            Colonoscopy Indications:          Chronic diarrhea, Iron deficiency anemia Providers:            Lollie Sails, MD Referring MD:         Sofie Hartigan (Referring MD) Medicines:            Monitored Anesthesia Care Complications:        No immediate complications. Procedure:            Pre-Anesthesia Assessment:                       - ASA Grade Assessment: III - A patient with severe                        systemic disease.                       After obtaining informed consent, the colonoscope was                        passed under direct vision. Throughout the procedure,                        the patient's blood pressure, pulse, and oxygen                        saturations were monitored continuously. The                        Colonoscope was introduced through the anus and                        advanced to the the cecum, identified by appendiceal                        orifice and ileocecal valve. The colonoscopy was                        extremely difficult due to a tortuous colon. Successful                        completion of the procedure was aided by changing the                        patient to a supine position, changing the patient to a                        prone position and using manual pressure. The quality                        of the bowel preparation was fair. Findings:      Multiple medium-mouthed diverticula were found in the sigmoid colon and       descending colon.      A 2 mm polyp was found in the rectum. The polyp was sessile.  The polyp       was removed with a cold biopsy forceps. Resection and retrieval were       complete.      possible fistular  openings noted in the distal rectum, not inflamed.      The digital rectal exam findings include mild stenosis at the anal verge       and multiple external skin tags.      The sigmoid colon, descending colon and transverse colon were extremely       redundant.      The digital rectal exam was abnormal.      Biopsies for histology were taken with a cold forceps from the right       colon and left colon for evaluation of microscopic colitis.      Multiple very small, noted to be deep to the lining angioectasias       without bleeding were found in the ascending colon. Impression:           - Preparation of the colon was fair.                       - Diverticulosis in the sigmoid colon and in the                        descending colon.                       - One 2 mm polyp in the rectum, removed with a cold                        biopsy forceps. Resected and retrieved.                       - Abnormal digital rectal exam.                       - Redundant colon.                       - Abnormal digital rectal exam. Recommendation:       - Discharge patient to home. Procedure Code(s):    --- Professional ---                       [phone], Colonoscopy, flexible; with biopsy, single or                        multiple Diagnosis Code(s):    --- Professional ---                       K62.1, Rectal polyp                       K62.89, Other specified diseases of anus and rectum                       K52.9, Noninfective gastroenteritis and colitis,                        unspecified                       D50.9, Iron deficiency anemia, unspecified  K57.30, Diverticulosis of large intestine without                        perforation or abscess without bleeding                       Q43.8, Other specified congenital malformations of                        intestine CPT copyright 2016 American Medical Association. All rights reserved. The codes documented in this report are  preliminary and upon coder review may  be revised to meet current compliance requirements. Lollie Sails, MD 08/14/2016 10:44:55 AM This report has been signed electronically. Number of Addenda: 0 Note Initiated On: 08/14/2016 8:51 AM Scope Withdrawal Time: 0 hours 11 minutes 5 seconds  Total Procedure Duration: 0 hours 46 minutes 5 seconds       [facility]

## 2016-08-14 NOTE — H&P (Signed)
Outpatient short stay form Pre-procedure 08/14/2016 9:05 AM Lollie Sails MD  Primary Physician: Dr. Thereasa Distance  Reason for visit:  EGD and colonoscopy  History of present illness:  Patient is a 76 year old female presenting today as above. She has a history of iron deficiency anemia that indeed goes back a number of years. She is also had some weight loss dyspepsia and chronic diarrhea symptoms. She had been taking Alka-Seltzer fairly regularly. Since take some any dose of aspirin. She states she's had no aspirin for about 5 days. She does take omeprazole. There is a family history of colon cancer and a secondary relative. She is under the care of Dr. Grayland Ormond for her iron deficiency anemia. She has undergone iron infusion. She tolerated her prep well.    Current Facility-Administered Medications:  .  0.9 %  sodium chloride infusion, , Intravenous, Continuous, Lollie Sails, MD .  0.9 %  sodium chloride infusion, , Intravenous, Continuous, Lollie Sails, MD  Prescriptions Prior to Admission  Medication Sig Dispense Refill Last Dose  . aspirin 81 MG EC tablet Take 81 mg by mouth daily. Swallow whole.   Past Week at Unknown time  . enalapril (VASOTEC) 20 MG tablet Take 20 mg by mouth daily.   Past Week at Unknown time  . Ergocalciferol (VITAMIN D2) 2000 units TABS Take 1 tablet by mouth daily.   Past Week at Unknown time  . ferrous fumarate (HEMOCYTE - 106 MG FE) 325 (106 Fe) MG TABS tablet Take 1 tablet by mouth daily.   Past Week at Unknown time  . gabapentin (NEURONTIN) 300 MG capsule Take 600 mg by mouth 3 (three) times daily.   08/13/2016 at Unknown time  . glipiZIDE (GLUCOTROL) 10 MG tablet Take 10 mg by mouth daily before breakfast.   Past Week at Unknown time  . glipiZIDE (GLUCOTROL) 5 MG tablet Take 5 mg by mouth every evening.   Past Week at Unknown time  . hydrOXYzine (ATARAX/VISTARIL) 25 MG tablet Take 25 mg by mouth 3 (three) times daily as needed. Take 1-2 tabs at  bedtime.   Past Week at Unknown time  . insulin glargine (LANTUS) 100 UNIT/ML injection Inject 20-22 Units into the skin daily.   08/13/2016 at Unknown time  . loratadine (CLARITIN) 10 MG tablet Take 10 mg by mouth daily.   Past Week at Unknown time  . metFORMIN (GLUCOPHAGE) 850 MG tablet Take 850 mg by mouth 3 (three) times daily.   Past Week at Unknown time  . niacin 500 MG CR capsule Take 500 mg by mouth at bedtime.   Past Week at Unknown time  . Omega-3 Fatty Acids (FISH OIL PO) Take 1 tablet by mouth.   Past Week at Unknown time  . simvastatin (ZOCOR) 40 MG tablet Take 40 mg by mouth daily at 6 PM.   08/13/2016 at Unknown time  . sitaGLIPtin (JANUVIA) 50 MG tablet Take 50 mg by mouth daily.   Past Week at Unknown time  . travoprost, benzalkonium, (TRAVATAN) 0.004 % ophthalmic solution 1 drop at bedtime.   Past Week at Unknown time  . vitamin B-12 (CYANOCOBALAMIN) 1000 MCG tablet Take 1,000 mcg by mouth daily.   Past Week at Unknown time     No Known Allergies   Past Medical History:  Diagnosis Date  . Cancer [facility])    breast cancer right mastectomy  . Diabetes mellitus without complication ([place])   . Glaucoma   . Hyperlipidemia   .  Hypertension   . Neuropathy ([place])   . Osteoarthritis   . Rheumatoid arthritis ([place])   . Telangiectasias     Review of systems:      Physical Exam    Heart and lungs: Regular rate and rhythm without rub or gallop, lungs are bilaterally clear.    HEENT: Normocephalic atraumatic eyes are anicteric    Other:     Pertinant exam for procedure: Soft nontender nondistended bowel sounds positive normoactive.    Planned proceedures: EGD and colonoscopy with indicated procedures. I have discussed the risks benefits and complications of procedures to include not limited to bleeding, infection, perforation and the risk of sedation and the patient wishes to proceed.    Lollie Sails, MD Gastroenterology 08/14/2016  9:05 AM

## 2016-08-14 NOTE — Transfer of Care (Signed)
Immediate Anesthesia Transfer of Care Note  Patient: Emily Faulkner  Procedure(s) Performed: Procedure(s): COLONOSCOPY WITH PROPOFOL (N/A) ESOPHAGOGASTRODUODENOSCOPY (EGD) WITH PROPOFOL (N/A)  Patient Location: PACU and Endoscopy Unit  Anesthesia Type:General  Level of Consciousness: sedated  Airway & Oxygen Therapy: Patient Spontanous Breathing and Patient connected to nasal cannula oxygen  Post-op Assessment: Report given to RN and Post -op Vital signs reviewed and stable  Post vital signs: Reviewed and stable  Last Vitals:  Vitals:   08/14/16 0853  BP: (!) 148/78  Pulse: (!) 106  Resp: 20  Temp: 37.2 C    Last Pain:  Vitals:   08/14/16 0853  TempSrc: Tympanic         Complications: No apparent anesthesia complications

## 2016-08-14 NOTE — Anesthesia Post-op Follow-up Note (Cosign Needed)
Anesthesia QCDR form completed.        

## 2016-08-14 NOTE — Op Note (Signed)
[facility] Patient Name: Emily Faulkner Procedure Date: 08/14/2016 8:52 AM MRN: [id number] Account #: [id number] Date of Birth: 1940/08/08 Admit Type: Outpatient Age: 76 Room: [facility] ENDO ROOM 3 Gender: Female Note Status: Finalized Procedure:            Upper GI endoscopy Indications:          Gastro-esophageal reflux disease Providers:            Lollie Sails, MD Referring MD:         Sofie Hartigan (Referring MD) Medicines:            Monitored Anesthesia Care Complications:        No immediate complications. Procedure:            Pre-Anesthesia Assessment:                       - ASA Grade Assessment: III - A patient with severe                        systemic disease.                       After obtaining informed consent, the endoscope was                        passed under direct vision. Throughout the procedure,                        the patient's blood pressure, pulse, and oxygen                        saturations were monitored continuously. The Endoscope                        was introduced through the mouth, and advanced to the                        third part of duodenum. The upper GI endoscopy was                        performed with moderate difficulty due to patient                        intolerance of esophageal intubation. Successful                        completion of the procedure was aided by increasing the                        dose of sedation medication. The patient tolerated the                        procedure. Findings:      LA Grade B (one or more mucosal breaks greater than 5 mm, not extending       between the tops of two mucosal folds) esophagitis with no bleeding was       found. Biopsies were taken with a cold forceps for histology.      Diffuse moderate inflammation characterized by congestion (edema) and       erythema was found in the entire  examined stomach. Biopsies were taken       with a cold  forceps for histology.      A deformity was found in the prepyloric region of the stomach possibly       previous ulcer disease..      A medium-sized hiatal hernia was found. The Z-line was a variable       distance from incisors; the hiatal hernia was sliding.      Patchy moderate inflammation characterized by erosions, erythema and       shallow ulcerations was found in the duodenal bulb and in the second       portion of the duodenum.      Abnormal motility was noted in the middle third of the esophagus and in       the lower third of the esophagus. The cricopharyngeus was normal. There       is spasticity of the esophageal body. Tertiary peristaltic waves are       noted.      Patchy candidiasis was found in the middle third of the esophagus and in       the lower third of the esophagus. Cells for cytology were obtained by       brushing. Impression:           - LA Grade B erosive esophagitis. Biopsied.                       - Chronic gastritis. Biopsied.                       - Acquired deformity in the prepyloric region of the                        stomach.                       - Medium-sized hiatal hernia.                       - Duodenitis.                       - Abnormal esophageal motility, suspicious for                        presbyesophagus.                       - Monilial esophagitis. Cells for cytology obtained. Recommendation:       - Use Aciphex (rabeprazole) 20 mg PO BID for 6 weeks.                       - Use Aciphex (rabeprazole) 20 mg PO daily daily.                       - Mycelex (clotrimazole) 10 mg lozenge 5x/day for 7                        days. Procedure Code(s):    --- Professional ---                       [phone], Esophagogastroduodenoscopy, flexible, transoral;  with biopsy, single or multiple Diagnosis Code(s):    --- Professional ---                       K20.8, Other esophagitis                       K29.50, Unspecified  chronic gastritis without bleeding                       K31.89, Other diseases of stomach and duodenum                       K44.9, Diaphragmatic hernia without obstruction or                        gangrene                       K29.80, Duodenitis without bleeding                       K22.4, Dyskinesia of esophagus                       B37.81, Candidal esophagitis                       K21.9, Gastro-esophageal reflux disease without                        esophagitis CPT copyright 2016 [employer] Association. All rights reserved. The codes documented in this report are preliminary and upon coder review may  be revised to meet current compliance requirements. Lollie Sails, MD 08/14/2016 9:49:18 AM This report has been signed electronically. Number of Addenda: 0 Note Initiated On: 08/14/2016 8:52 AM      [facility]

## 2016-08-14 NOTE — Anesthesia Postprocedure Evaluation (Signed)
Anesthesia Post Note  Patient: Emily Faulkner  Procedure(s) Performed: Procedure(s) (LRB): COLONOSCOPY WITH PROPOFOL (N/A) ESOPHAGOGASTRODUODENOSCOPY (EGD) WITH PROPOFOL (N/A)  Patient location during evaluation: Endoscopy Anesthesia Type: General Level of consciousness: awake and alert and oriented Pain management: pain level controlled Vital Signs Assessment: post-procedure vital signs reviewed and stable Respiratory status: spontaneous breathing, nonlabored ventilation and respiratory function stable Cardiovascular status: blood pressure returned to baseline and stable Postop Assessment: no signs of nausea or vomiting Anesthetic complications: no     Last Vitals:  Vitals:   08/14/16 1053 08/14/16 1103  BP: 102/61 (P) 103/60  Pulse: 89   Resp: (!) 24   Temp:      Last Pain:  Vitals:   08/14/16 1043  TempSrc: Tympanic                 Abeeha Twist

## 2016-08-14 NOTE — Anesthesia Preprocedure Evaluation (Signed)
Anesthesia Evaluation  Patient identified by MRN, date of birth, ID band Patient awake    Reviewed: Allergy & Precautions, NPO status , Patient's Chart, lab work & pertinent test results  History of Anesthesia Complications Negative for: history of anesthetic complications  Airway Mallampati: II  TM Distance: >3 FB Neck ROM: Full    Dental  (+) Missing, Poor Dentition   Pulmonary neg sleep apnea, neg COPD, former smoker,    breath sounds clear to auscultation- rhonchi (-) wheezing      Cardiovascular hypertension, Pt. on medications (-) CAD and (-) Past MI  Rhythm:Regular Rate:Normal - Systolic murmurs and - Diastolic murmurs    Neuro/Psych negative neurological ROS  negative psych ROS   GI/Hepatic negative GI ROS, Neg liver ROS,   Endo/Other  diabetes, Oral Hypoglycemic Agents, Insulin Dependent  Renal/GU negative Renal ROS     Musculoskeletal  (+) Arthritis ,   Abdominal (+) - obese,   Peds  Hematology  (+) anemia ,   Anesthesia Other Findings Past Medical History: No date: Cancer ([place])     Comment: breast cancer right mastectomy No date: Diabetes mellitus without complication (HCC) No date: Glaucoma No date: Hyperlipidemia No date: Hypertension No date: Neuropathy (HCC) No date: Osteoarthritis No date: Rheumatoid arthritis (HCC) No date: Telangiectasias   Reproductive/Obstetrics                             Anesthesia Physical Anesthesia Plan  ASA: III  Anesthesia Plan: General   Post-op Pain Management:    Induction: Intravenous  Airway Management Planned: Natural Airway  Additional Equipment:   Intra-op Plan:   Post-operative Plan:   Informed Consent: I have reviewed the patients History and Physical, chart, labs and discussed the procedure including the risks, benefits and alternatives for the proposed anesthesia with the patient or authorized representative who  has indicated his/her understanding and acceptance.   Dental advisory given  Plan Discussed with: CRNA and Anesthesiologist  Anesthesia Plan Comments:         Anesthesia Quick Evaluation

## 2016-08-15 ENCOUNTER — Encounter: Payer: Self-pay | Admitting: Gastroenterology

## 2016-08-16 ENCOUNTER — Ambulatory Visit: Admission: RE | Admit: 2016-08-16 | Payer: Medicare HMO | Source: Ambulatory Visit

## 2016-08-16 LAB — SURGICAL PATHOLOGY

## 2016-09-06 DIAGNOSIS — I272 Pulmonary hypertension, unspecified: Secondary | ICD-10-CM | POA: Diagnosis not present

## 2016-09-06 DIAGNOSIS — E8770 Fluid overload, unspecified: Secondary | ICD-10-CM | POA: Diagnosis not present

## 2016-09-06 DIAGNOSIS — B9681 Helicobacter pylori [H. pylori] as the cause of diseases classified elsewhere: Secondary | ICD-10-CM | POA: Diagnosis not present

## 2016-09-06 DIAGNOSIS — E1142 Type 2 diabetes mellitus with diabetic polyneuropathy: Secondary | ICD-10-CM | POA: Diagnosis not present

## 2016-09-06 DIAGNOSIS — D508 Other iron deficiency anemias: Secondary | ICD-10-CM | POA: Diagnosis not present

## 2016-09-06 DIAGNOSIS — E1122 Type 2 diabetes mellitus with diabetic chronic kidney disease: Secondary | ICD-10-CM | POA: Diagnosis not present

## 2016-09-06 DIAGNOSIS — Z87891 Personal history of nicotine dependence: Secondary | ICD-10-CM | POA: Diagnosis not present

## 2016-09-06 DIAGNOSIS — N179 Acute kidney failure, unspecified: Secondary | ICD-10-CM | POA: Diagnosis not present

## 2016-09-06 DIAGNOSIS — R0602 Shortness of breath: Secondary | ICD-10-CM | POA: Diagnosis not present

## 2016-09-06 DIAGNOSIS — N183 Chronic kidney disease, stage 3 (moderate): Secondary | ICD-10-CM | POA: Diagnosis not present

## 2016-09-06 DIAGNOSIS — D649 Anemia, unspecified: Secondary | ICD-10-CM | POA: Insufficient documentation

## 2016-09-06 DIAGNOSIS — R931 Abnormal findings on diagnostic imaging of heart and coronary circulation: Secondary | ICD-10-CM | POA: Diagnosis not present

## 2016-09-06 DIAGNOSIS — R0601 Orthopnea: Secondary | ICD-10-CM | POA: Diagnosis not present

## 2016-09-06 DIAGNOSIS — E1165 Type 2 diabetes mellitus with hyperglycemia: Secondary | ICD-10-CM | POA: Diagnosis not present

## 2016-09-06 DIAGNOSIS — R938 Abnormal findings on diagnostic imaging of other specified body structures: Secondary | ICD-10-CM | POA: Diagnosis not present

## 2016-09-06 DIAGNOSIS — E782 Mixed hyperlipidemia: Secondary | ICD-10-CM | POA: Diagnosis not present

## 2016-09-06 DIAGNOSIS — J449 Chronic obstructive pulmonary disease, unspecified: Secondary | ICD-10-CM | POA: Diagnosis not present

## 2016-09-06 DIAGNOSIS — K2960 Other gastritis without bleeding: Secondary | ICD-10-CM | POA: Diagnosis not present

## 2016-09-06 DIAGNOSIS — E875 Hyperkalemia: Secondary | ICD-10-CM | POA: Diagnosis not present

## 2016-09-06 DIAGNOSIS — I700 Atherosclerosis of aorta: Secondary | ICD-10-CM | POA: Diagnosis not present

## 2016-09-06 DIAGNOSIS — E7800 Pure hypercholesterolemia, unspecified: Secondary | ICD-10-CM | POA: Insufficient documentation

## 2016-09-06 DIAGNOSIS — I10 Essential (primary) hypertension: Secondary | ICD-10-CM | POA: Diagnosis not present

## 2016-09-06 DIAGNOSIS — Z794 Long term (current) use of insulin: Secondary | ICD-10-CM | POA: Diagnosis not present

## 2016-09-06 DIAGNOSIS — I517 Cardiomegaly: Secondary | ICD-10-CM | POA: Diagnosis not present

## 2016-09-06 DIAGNOSIS — R000 Tachycardia, unspecified: Secondary | ICD-10-CM | POA: Diagnosis not present

## 2016-09-11 DIAGNOSIS — I272 Pulmonary hypertension, unspecified: Secondary | ICD-10-CM | POA: Insufficient documentation

## 2016-09-12 NOTE — Progress Notes (Signed)
[place]  [phone] [phone]) [phone]  ID: [name] OB: 16-Jan-1941  MR#: [id number]  VZD#:[id number]  Patient Care Team: Katheren Shams as PCP - General  CHIEF COMPLAINT: Iron deficiency anemia.  INTERVAL HISTORY: Patient returns to clinic today for repeat laboratory work and further evaluation. She was recently admitted to the hospital with multiple medical problems including significant anemia requiring 2 units of packed red blood cells. She currently feels improved and is nearly back to her baseline. She has chronic weakness and fatigue. She denies any pain. She reports shortness of breath with exertion but has no chest pain. She reports chronic diarrhea, occasionally taking imodium to control.  She denies any nausea, vomiting, or constipation. She has no melena or hematochezia. She has no urinary complaints. Patient offers no further specific complaints today.  REVIEW OF SYSTEMS:   Review of Systems  Constitutional: Positive for malaise/fatigue. Negative for fever and weight loss.  HENT: Negative for congestion.   Respiratory: Positive for shortness of breath. Negative for cough.   Cardiovascular: Negative.  Negative for chest pain and leg swelling.  Gastrointestinal: Positive for diarrhea. Negative for abdominal pain, blood in stool, constipation, nausea and vomiting.  Genitourinary: Negative.  Negative for dysuria, frequency and urgency.  Musculoskeletal: Negative.   Neurological: Positive for tremors and weakness. Negative for tingling and headaches.  Psychiatric/Behavioral: The patient is not nervous/anxious.    As per HPI. Otherwise, a complete review of systems is negative.  PAST MEDICAL HISTORY: Past Medical History:  Diagnosis Date  . Cancer [facility])    breast cancer right mastectomy  . Diabetes mellitus without complication ([place])   . Glaucoma   . Hyperlipidemia   . Hypertension   . Neuropathy ([place])   . Osteoarthritis   .  Rheumatoid arthritis ([place])   . Telangiectasias     PAST SURGICAL HISTORY: Past Surgical History:  Procedure Laterality Date  . BREAST SURGERY     right mastectomy  . CHOLECYSTECTOMY    . COLONOSCOPY    . COLONOSCOPY WITH PROPOFOL N/A 08/14/2016   Procedure: COLONOSCOPY WITH PROPOFOL;  Surgeon: Lollie Sails, MD;  Location: [facility] ENDOSCOPY;  Service: Endoscopy;  Laterality: N/A;  . ESOPHAGOGASTRODUODENOSCOPY (EGD) WITH PROPOFOL N/A 08/14/2016   Procedure: ESOPHAGOGASTRODUODENOSCOPY (EGD) WITH PROPOFOL;  Surgeon: Lollie Sails, MD;  Location: [facility] ENDOSCOPY;  Service: Endoscopy;  Laterality: N/A;  . EXCISIONAL HEMORRHOIDECTOMY      FAMILY HISTORY: Reviewed and unchanged. No reported history of malignancy or chronic disease.  ADVANCED DIRECTIVES (Y/N):  N  HEALTH MAINTENANCE: Social History  Substance Use Topics  . Smoking status: Former Research scientist (life sciences)  . Smokeless tobacco: Never Used  . Alcohol use Not on file     Colonoscopy:  PAP:  Bone density:  Lipid panel:  No Known Allergies  Current Outpatient Prescriptions  Medication Sig Dispense Refill  . albuterol (PROVENTIL HFA;VENTOLIN HFA) 108 (90 Base) MCG/ACT inhaler Inhale into the lungs.    Marland Kitchen ammonium lactate (LAC-HYDRIN) 12 % lotion Apply topically.    Marland Kitchen aspirin 81 MG EC tablet Take 81 mg by mouth daily. Swallow whole.    . Ergocalciferol (VITAMIN D2) 2000 units TABS Take 1 tablet by mouth daily.    . ferrous fumarate (HEMOCYTE - 106 MG FE) 325 (106 Fe) MG TABS tablet Take 1 tablet by mouth daily.    . furosemide (LASIX) 40 MG tablet Take 40 mg by mouth.    . gabapentin (NEURONTIN) 300 MG capsule Take 600 mg by mouth  2 (two) times daily.     . Insulin Glargine (LANTUS SOLOSTAR) 100 UNIT/ML Solostar Pen Inject 20 Units into the skin.     . magnesium oxide (MAG-OX) 400 MG tablet Take 800 mg by mouth.    . metFORMIN (GLUCOPHAGE) 850 MG tablet Take 850 mg by mouth 3 (three) times daily.    . niacin 500 MG CR capsule Take 500  mg by mouth at bedtime.    . Omega-3 Fatty Acids (FISH OIL PO) Take 1 tablet by mouth.    . RABEprazole (ACIPHEX) 20 MG tablet Take by mouth.    . vitamin B-12 (CYANOCOBALAMIN) 1000 MCG tablet Take 1,000 mcg by mouth daily.    . DOCOSAHEXAENOIC ACID PO Take 1,200 mg by mouth.    . loratadine (CLARITIN) 10 MG tablet Take 10 mg by mouth daily.     No current facility-administered medications for this visit.     OBJECTIVE: Vitals:   09/14/16 0904  BP: 122/70  Pulse: 94  Resp: 18  Temp: 98.3 F (36.8 C)     Body mass index is 25.78 kg/m.    ECOG FS:1 - Symptomatic but completely ambulatory  General: Well-developed, well-nourished, no acute distress. Eyes: Pink conjunctiva, anicteric sclera. Lungs: Clear to auscultation bilaterally. Heart: Regular rate and rhythm. No rubs, murmurs, or gallops. Abdomen: Soft, nontender, nondistended. No organomegaly noted, normoactive bowel sounds. Musculoskeletal: No edema, cyanosis, or clubbing. Neuro: Alert, answering all questions appropriately. Cranial nerves grossly intact. Skin: No rashes or petechiae noted. Psych: Normal affect.   LAB RESULTS:  No results found for: NA, K, CL, CO2, GLUCOSE, BUN, CREATININE, CALCIUM, PROT, ALBUMIN, AST, ALT, ALKPHOS, BILITOT, GFRNONAA, GFRAA  Lab Results  Component Value Date   WBC 6.7 09/14/2016   HGB 10.2 (L) 09/14/2016   HCT 31.2 (L) 09/14/2016   MCV 91.6 09/14/2016   PLT 237 09/14/2016   Lab Results  Component Value Date   IRON 39 07/11/2016   TIBC 369 07/11/2016   IRONPCTSAT 11 07/11/2016   Lab Results  Component Value Date   FERRITIN 26 07/11/2016    STUDIES: No results found.  ASSESSMENT: Iron deficiency anemia.  PLAN:    1. Iron deficiency anemia: Patient's hemoglobin is decreased, but improved since receiving 2 units packed red blood cells during her her hospitalization at [facility] last week. Iron stores as well as repeat B-12 and folate are pending at time of dictation. We will  arrange follow-up based on these laboratory work.   2. Hypertension: Continue current medications as prescribed.  Approximately 30 minutes was spent in discussion of her transient 50% was consultation.  Patient expressed understanding and was in agreement with this plan. She also understands that She can call clinic at any time with any questions, concerns, or complaints.    Lloyd Huger, MD 09/14/16 12:31 PM

## 2016-09-13 DIAGNOSIS — E875 Hyperkalemia: Secondary | ICD-10-CM | POA: Diagnosis not present

## 2016-09-13 DIAGNOSIS — Z794 Long term (current) use of insulin: Secondary | ICD-10-CM | POA: Diagnosis not present

## 2016-09-13 DIAGNOSIS — Z87891 Personal history of nicotine dependence: Secondary | ICD-10-CM | POA: Diagnosis not present

## 2016-09-13 DIAGNOSIS — D539 Nutritional anemia, unspecified: Secondary | ICD-10-CM | POA: Diagnosis not present

## 2016-09-13 DIAGNOSIS — I10 Essential (primary) hypertension: Secondary | ICD-10-CM | POA: Diagnosis not present

## 2016-09-13 DIAGNOSIS — Z7982 Long term (current) use of aspirin: Secondary | ICD-10-CM | POA: Diagnosis not present

## 2016-09-13 DIAGNOSIS — E119 Type 2 diabetes mellitus without complications: Secondary | ICD-10-CM | POA: Diagnosis not present

## 2016-09-14 ENCOUNTER — Inpatient Hospital Stay: Payer: Medicare HMO

## 2016-09-14 ENCOUNTER — Encounter (INDEPENDENT_AMBULATORY_CARE_PROVIDER_SITE_OTHER): Payer: Self-pay

## 2016-09-14 ENCOUNTER — Inpatient Hospital Stay: Payer: Medicare HMO | Attending: Oncology | Admitting: Oncology

## 2016-09-14 VITALS — BP 122/70 | HR 94 | Temp 98.3°F | Resp 18 | Wt 152.6 lb

## 2016-09-14 DIAGNOSIS — H409 Unspecified glaucoma: Secondary | ICD-10-CM | POA: Diagnosis not present

## 2016-09-14 DIAGNOSIS — D508 Other iron deficiency anemias: Secondary | ICD-10-CM

## 2016-09-14 DIAGNOSIS — Z794 Long term (current) use of insulin: Secondary | ICD-10-CM | POA: Insufficient documentation

## 2016-09-14 DIAGNOSIS — E119 Type 2 diabetes mellitus without complications: Secondary | ICD-10-CM | POA: Insufficient documentation

## 2016-09-14 DIAGNOSIS — Z7982 Long term (current) use of aspirin: Secondary | ICD-10-CM | POA: Diagnosis not present

## 2016-09-14 DIAGNOSIS — E875 Hyperkalemia: Secondary | ICD-10-CM | POA: Diagnosis not present

## 2016-09-14 DIAGNOSIS — I10 Essential (primary) hypertension: Secondary | ICD-10-CM | POA: Insufficient documentation

## 2016-09-14 DIAGNOSIS — Z9011 Acquired absence of right breast and nipple: Secondary | ICD-10-CM | POA: Insufficient documentation

## 2016-09-14 DIAGNOSIS — Z87891 Personal history of nicotine dependence: Secondary | ICD-10-CM | POA: Insufficient documentation

## 2016-09-14 DIAGNOSIS — Z853 Personal history of malignant neoplasm of breast: Secondary | ICD-10-CM | POA: Insufficient documentation

## 2016-09-14 DIAGNOSIS — D509 Iron deficiency anemia, unspecified: Secondary | ICD-10-CM | POA: Insufficient documentation

## 2016-09-14 DIAGNOSIS — E785 Hyperlipidemia, unspecified: Secondary | ICD-10-CM | POA: Insufficient documentation

## 2016-09-14 DIAGNOSIS — M069 Rheumatoid arthritis, unspecified: Secondary | ICD-10-CM | POA: Diagnosis not present

## 2016-09-14 LAB — CBC
HEMATOCRIT: 31.2 % — AB (ref 35.0–47.0)
Hemoglobin: 10.2 g/dL — ABNORMAL LOW (ref 12.0–16.0)
MCH: 30.1 pg (ref 26.0–34.0)
MCHC: 32.8 g/dL (ref 32.0–36.0)
MCV: 91.6 fL (ref 80.0–100.0)
PLATELETS: 237 10*3/uL (ref 150–440)
RBC: 3.4 MIL/uL — ABNORMAL LOW (ref 3.80–5.20)
RDW: 18.6 % — ABNORMAL HIGH (ref 11.5–14.5)
WBC: 6.7 10*3/uL (ref 3.6–11.0)

## 2016-09-14 LAB — IRON AND TIBC
Iron: 32 ug/dL (ref 28–170)
SATURATION RATIOS: 8 % — AB (ref 10.4–31.8)
TIBC: 409 ug/dL (ref 250–450)
UIBC: 377 ug/dL

## 2016-09-14 LAB — FOLATE: FOLATE: 6.9 ng/mL (ref >5.9)

## 2016-09-14 LAB — FERRITIN: Ferritin: 19 ng/mL (ref 11–307)

## 2016-09-14 LAB — DAT, POLYSPECIFIC AHG (ARMC ONLY): POLYSPECIFIC AHG TEST: NEGATIVE

## 2016-09-14 LAB — VITAMIN B12: VITAMIN B 12: 980 pg/mL — AB (ref 180–914)

## 2016-09-14 NOTE — Progress Notes (Signed)
Patient is here for follow up from the hospital

## 2016-09-17 DIAGNOSIS — N183 Chronic kidney disease, stage 3 (moderate): Secondary | ICD-10-CM | POA: Diagnosis not present

## 2016-09-17 DIAGNOSIS — E1142 Type 2 diabetes mellitus with diabetic polyneuropathy: Secondary | ICD-10-CM | POA: Diagnosis not present

## 2016-09-17 DIAGNOSIS — Z794 Long term (current) use of insulin: Secondary | ICD-10-CM | POA: Diagnosis not present

## 2016-09-17 DIAGNOSIS — I10 Essential (primary) hypertension: Secondary | ICD-10-CM | POA: Diagnosis not present

## 2016-09-19 DIAGNOSIS — Z794 Long term (current) use of insulin: Secondary | ICD-10-CM | POA: Diagnosis not present

## 2016-09-19 DIAGNOSIS — Z7982 Long term (current) use of aspirin: Secondary | ICD-10-CM | POA: Diagnosis not present

## 2016-09-19 DIAGNOSIS — Z87891 Personal history of nicotine dependence: Secondary | ICD-10-CM | POA: Diagnosis not present

## 2016-09-19 DIAGNOSIS — I10 Essential (primary) hypertension: Secondary | ICD-10-CM | POA: Diagnosis not present

## 2016-09-19 DIAGNOSIS — E119 Type 2 diabetes mellitus without complications: Secondary | ICD-10-CM | POA: Diagnosis not present

## 2016-09-19 DIAGNOSIS — E875 Hyperkalemia: Secondary | ICD-10-CM | POA: Diagnosis not present

## 2016-09-19 DIAGNOSIS — D539 Nutritional anemia, unspecified: Secondary | ICD-10-CM | POA: Diagnosis not present

## 2016-09-24 ENCOUNTER — Other Ambulatory Visit: Payer: Self-pay | Admitting: Gastroenterology

## 2016-09-24 DIAGNOSIS — R1084 Generalized abdominal pain: Secondary | ICD-10-CM | POA: Diagnosis not present

## 2016-09-24 DIAGNOSIS — D509 Iron deficiency anemia, unspecified: Secondary | ICD-10-CM | POA: Diagnosis not present

## 2016-09-26 DIAGNOSIS — B029 Zoster without complications: Secondary | ICD-10-CM | POA: Diagnosis not present

## 2016-09-26 DIAGNOSIS — E1140 Type 2 diabetes mellitus with diabetic neuropathy, unspecified: Secondary | ICD-10-CM | POA: Diagnosis not present

## 2016-09-26 DIAGNOSIS — Z794 Long term (current) use of insulin: Secondary | ICD-10-CM | POA: Diagnosis not present

## 2016-09-27 ENCOUNTER — Ambulatory Visit
Admission: RE | Admit: 2016-09-27 | Discharge: 2016-09-27 | Disposition: A | Discharge status: Home / Self Care | Payer: Medicare HMO | Source: Ambulatory Visit | Attending: Gastroenterology | Admitting: Gastroenterology

## 2016-09-27 DIAGNOSIS — K5730 Diverticulosis of large intestine without perforation or abscess without bleeding: Secondary | ICD-10-CM | POA: Diagnosis not present

## 2016-09-27 DIAGNOSIS — I2510 Atherosclerotic heart disease of native coronary artery without angina pectoris: Secondary | ICD-10-CM | POA: Insufficient documentation

## 2016-09-27 DIAGNOSIS — I700 Atherosclerosis of aorta: Secondary | ICD-10-CM | POA: Diagnosis not present

## 2016-09-27 DIAGNOSIS — R197 Diarrhea, unspecified: Secondary | ICD-10-CM | POA: Diagnosis not present

## 2016-09-27 DIAGNOSIS — R161 Splenomegaly, not elsewhere classified: Secondary | ICD-10-CM | POA: Diagnosis not present

## 2016-09-27 DIAGNOSIS — N289 Disorder of kidney and ureter, unspecified: Secondary | ICD-10-CM | POA: Insufficient documentation

## 2016-09-27 DIAGNOSIS — I517 Cardiomegaly: Secondary | ICD-10-CM | POA: Insufficient documentation

## 2016-09-27 DIAGNOSIS — R1084 Generalized abdominal pain: Secondary | ICD-10-CM | POA: Diagnosis not present

## 2016-09-27 DIAGNOSIS — R933 Abnormal findings on diagnostic imaging of other parts of digestive tract: Secondary | ICD-10-CM | POA: Insufficient documentation

## 2016-09-27 DIAGNOSIS — R188 Other ascites: Secondary | ICD-10-CM | POA: Diagnosis not present

## 2016-09-27 MED ORDER — IOPAMIDOL (ISOVUE-300) INJECTION 61%
100.0000 mL | Freq: Once | INTRAVENOUS | Status: AC | PRN
  Administered 2016-09-27: 80 mL via INTRAVENOUS

## 2016-10-10 ENCOUNTER — Inpatient Hospital Stay: Payer: Medicare HMO | Attending: Oncology

## 2016-10-10 DIAGNOSIS — N2889 Other specified disorders of kidney and ureter: Secondary | ICD-10-CM | POA: Diagnosis not present

## 2016-10-10 DIAGNOSIS — Z79899 Other long term (current) drug therapy: Secondary | ICD-10-CM | POA: Diagnosis not present

## 2016-10-10 DIAGNOSIS — I700 Atherosclerosis of aorta: Secondary | ICD-10-CM | POA: Diagnosis not present

## 2016-10-10 DIAGNOSIS — Z87891 Personal history of nicotine dependence: Secondary | ICD-10-CM | POA: Insufficient documentation

## 2016-10-10 DIAGNOSIS — K5730 Diverticulosis of large intestine without perforation or abscess without bleeding: Secondary | ICD-10-CM | POA: Insufficient documentation

## 2016-10-10 DIAGNOSIS — Z853 Personal history of malignant neoplasm of breast: Secondary | ICD-10-CM | POA: Insufficient documentation

## 2016-10-10 DIAGNOSIS — D649 Anemia, unspecified: Secondary | ICD-10-CM

## 2016-10-10 DIAGNOSIS — E785 Hyperlipidemia, unspecified: Secondary | ICD-10-CM | POA: Diagnosis not present

## 2016-10-10 DIAGNOSIS — I8500 Esophageal varices without bleeding: Secondary | ICD-10-CM | POA: Diagnosis not present

## 2016-10-10 DIAGNOSIS — E119 Type 2 diabetes mellitus without complications: Secondary | ICD-10-CM | POA: Diagnosis not present

## 2016-10-10 DIAGNOSIS — H409 Unspecified glaucoma: Secondary | ICD-10-CM | POA: Diagnosis not present

## 2016-10-10 DIAGNOSIS — K219 Gastro-esophageal reflux disease without esophagitis: Secondary | ICD-10-CM | POA: Diagnosis not present

## 2016-10-10 DIAGNOSIS — D509 Iron deficiency anemia, unspecified: Secondary | ICD-10-CM | POA: Diagnosis not present

## 2016-10-10 DIAGNOSIS — Z794 Long term (current) use of insulin: Secondary | ICD-10-CM | POA: Insufficient documentation

## 2016-10-10 DIAGNOSIS — Z9011 Acquired absence of right breast and nipple: Secondary | ICD-10-CM | POA: Diagnosis not present

## 2016-10-10 DIAGNOSIS — Z7982 Long term (current) use of aspirin: Secondary | ICD-10-CM | POA: Insufficient documentation

## 2016-10-10 DIAGNOSIS — M069 Rheumatoid arthritis, unspecified: Secondary | ICD-10-CM | POA: Diagnosis not present

## 2016-10-10 DIAGNOSIS — I10 Essential (primary) hypertension: Secondary | ICD-10-CM | POA: Insufficient documentation

## 2016-10-10 DIAGNOSIS — Z9049 Acquired absence of other specified parts of digestive tract: Secondary | ICD-10-CM | POA: Insufficient documentation

## 2016-10-10 DIAGNOSIS — I517 Cardiomegaly: Secondary | ICD-10-CM | POA: Insufficient documentation

## 2016-10-10 LAB — CBC WITH DIFFERENTIAL/PLATELET
BASOS ABS: 0.1 10*3/uL (ref 0–0.1)
Basophils Relative: 1 %
Eosinophils Absolute: 0.8 10*3/uL — ABNORMAL HIGH (ref 0–0.7)
Eosinophils Relative: 8 %
HCT: 29 % — ABNORMAL LOW (ref 35.0–47.0)
HEMOGLOBIN: 9.4 g/dL — AB (ref 12.0–16.0)
Lymphocytes Relative: 12 %
Lymphs Abs: 1.1 10*3/uL (ref 1.0–3.6)
MCH: 29.9 pg (ref 26.0–34.0)
MCHC: 32.6 g/dL (ref 32.0–36.0)
MCV: 91.7 fL (ref 80.0–100.0)
MONOS PCT: 8 %
Monocytes Absolute: 0.7 10*3/uL (ref 0.2–0.9)
NEUTROS PCT: 71 %
Neutro Abs: 6.4 10*3/uL (ref 1.4–6.5)
Platelets: 255 10*3/uL (ref 150–440)
RBC: 3.16 MIL/uL — ABNORMAL LOW (ref 3.80–5.20)
RDW: 17.7 % — AB (ref 11.5–14.5)
WBC: 9.2 10*3/uL (ref 3.6–11.0)

## 2016-10-10 LAB — IRON AND TIBC
Iron: 53 ug/dL (ref 28–170)
SATURATION RATIOS: 14 % (ref 10.4–31.8)
TIBC: 379 ug/dL (ref 250–450)
UIBC: 326 ug/dL

## 2016-10-10 LAB — FERRITIN: Ferritin: 25 ng/mL (ref 11–307)

## 2016-10-12 ENCOUNTER — Inpatient Hospital Stay: Payer: Medicare HMO

## 2016-10-12 ENCOUNTER — Inpatient Hospital Stay (HOSPITAL_BASED_OUTPATIENT_CLINIC_OR_DEPARTMENT_OTHER): Payer: Medicare HMO | Admitting: Oncology

## 2016-10-12 VITALS — BP 129/69 | HR 91 | Temp 98.4°F | Resp 18 | Wt 155.8 lb

## 2016-10-12 DIAGNOSIS — N2889 Other specified disorders of kidney and ureter: Secondary | ICD-10-CM | POA: Diagnosis not present

## 2016-10-12 DIAGNOSIS — Z79899 Other long term (current) drug therapy: Secondary | ICD-10-CM | POA: Diagnosis not present

## 2016-10-12 DIAGNOSIS — D509 Iron deficiency anemia, unspecified: Secondary | ICD-10-CM

## 2016-10-12 DIAGNOSIS — Z9011 Acquired absence of right breast and nipple: Secondary | ICD-10-CM

## 2016-10-12 DIAGNOSIS — I10 Essential (primary) hypertension: Secondary | ICD-10-CM

## 2016-10-12 DIAGNOSIS — M069 Rheumatoid arthritis, unspecified: Secondary | ICD-10-CM | POA: Diagnosis not present

## 2016-10-12 DIAGNOSIS — Z853 Personal history of malignant neoplasm of breast: Secondary | ICD-10-CM | POA: Diagnosis not present

## 2016-10-12 DIAGNOSIS — E119 Type 2 diabetes mellitus without complications: Secondary | ICD-10-CM | POA: Diagnosis not present

## 2016-10-12 DIAGNOSIS — E785 Hyperlipidemia, unspecified: Secondary | ICD-10-CM | POA: Diagnosis not present

## 2016-10-12 DIAGNOSIS — D508 Other iron deficiency anemias: Secondary | ICD-10-CM

## 2016-10-12 DIAGNOSIS — H409 Unspecified glaucoma: Secondary | ICD-10-CM | POA: Diagnosis not present

## 2016-10-12 NOTE — Progress Notes (Signed)
[place]  [phone] [phone]  ID: [name] OB: Oct 23, 1940  MR#: [id number]  EYC#:[id number]  Patient Care Team: Sofie Hartigan, MD as PCP - General (Family Medicine)  CHIEF COMPLAINT: Iron deficiency anemia.  INTERVAL HISTORY: Patient returns to clinic today for repeat laboratory work and further evaluation. She does not complain of weakness or fatigue today. She currently feels well and is asymptomatic. She denies any pain. Has occasional dyspnea on exertion, but denies any chest pain, cough, or hemoptysis. She denies any nausea, vomiting, diarrhea, or constipation. She has no melena or hematochezia. She has no urinary complaints. Patient offers no further specific complaints today.  REVIEW OF SYSTEMS:   Review of Systems  Constitutional: Negative for fever, malaise/fatigue and weight loss.  HENT: Negative for congestion.   Respiratory: Positive for shortness of breath. Negative for cough and hemoptysis.   Cardiovascular: Negative.  Negative for chest pain and leg swelling.  Gastrointestinal: Negative for abdominal pain, blood in stool, constipation, diarrhea, nausea and vomiting.  Genitourinary: Negative.  Negative for dysuria, frequency and urgency.  Musculoskeletal: Negative.   Neurological: Positive for tremors. Negative for tingling, weakness and headaches.  Psychiatric/Behavioral: Negative for depression. The patient is not nervous/anxious.    As per HPI. Otherwise, a complete review of systems is negative.  PAST MEDICAL HISTORY: Past Medical History:  Diagnosis Date  . Cancer [facility])    breast cancer right mastectomy  . Diabetes mellitus without complication ([place])   . Glaucoma   . Hyperlipidemia   . Hypertension   . Neuropathy ([place])   . Osteoarthritis   . Rheumatoid arthritis ([place])   . Telangiectasias     PAST SURGICAL HISTORY: Past Surgical History:  Procedure Laterality Date  . BREAST SURGERY     right  mastectomy  . CHOLECYSTECTOMY    . COLONOSCOPY    . COLONOSCOPY WITH PROPOFOL N/A 08/14/2016   Procedure: COLONOSCOPY WITH PROPOFOL;  Surgeon: Lollie Sails, MD;  Location: [facility] ENDOSCOPY;  Service: Endoscopy;  Laterality: N/A;  . ESOPHAGOGASTRODUODENOSCOPY (EGD) WITH PROPOFOL N/A 08/14/2016   Procedure: ESOPHAGOGASTRODUODENOSCOPY (EGD) WITH PROPOFOL;  Surgeon: Lollie Sails, MD;  Location: [facility] ENDOSCOPY;  Service: Endoscopy;  Laterality: N/A;  . EXCISIONAL HEMORRHOIDECTOMY      FAMILY HISTORY: Reviewed and unchanged. No reported history of malignancy or chronic disease.  ADVANCED DIRECTIVES (Y/N):  N  HEALTH MAINTENANCE: Social History  Substance Use Topics  . Smoking status: Former Research scientist (life sciences)  . Smokeless tobacco: Never Used  . Alcohol use Not on file     Colonoscopy:  PAP:  Bone density:  Lipid panel:  No Known Allergies  Current Outpatient Prescriptions  Medication Sig Dispense Refill  . ammonium lactate (LAC-HYDRIN) 12 % lotion Apply topically.    Marland Kitchen aspirin 81 MG EC tablet Take 81 mg by mouth daily. Swallow whole.    . DOCOSAHEXAENOIC ACID PO Take 1,200 mg by mouth.    . Ergocalciferol (VITAMIN D2) 2000 units TABS Take 1 tablet by mouth daily.    . ferrous fumarate (HEMOCYTE - 106 MG FE) 325 (106 Fe) MG TABS tablet Take 1 tablet by mouth daily.    . furosemide (LASIX) 40 MG tablet Take 40 mg by mouth.    . gabapentin (NEURONTIN) 300 MG capsule Take 600 mg by mouth 2 (two) times daily.     Marland Kitchen glipiZIDE (GLUCOTROL) 10 MG tablet Take 10 mg by mouth daily before breakfast.    . Insulin Glargine (LANTUS SOLOSTAR) 100 UNIT/ML  Solostar Pen Inject 20 Units into the skin.     . magnesium oxide (MAG-OX) 400 MG tablet Take 800 mg by mouth.    . metFORMIN (GLUCOPHAGE) 850 MG tablet Take 500 mg by mouth 3 (three) times daily.     . niacin 500 MG CR capsule Take 500 mg by mouth at bedtime.    . Omega-3 Fatty Acids (FISH OIL PO) Take 1 tablet by mouth.    . sitaGLIPtin (JANUVIA) 50  MG tablet Take 50 mg by mouth daily.    . vitamin B-12 (CYANOCOBALAMIN) 1000 MCG tablet Take 1,000 mcg by mouth daily.    Marland Kitchen albuterol (PROVENTIL HFA;VENTOLIN HFA) 108 (90 Base) MCG/ACT inhaler Inhale into the lungs.    Marland Kitchen loratadine (CLARITIN) 10 MG tablet Take 10 mg by mouth daily.    . RABEprazole (ACIPHEX) 20 MG tablet Take by mouth.     No current facility-administered medications for this visit.     OBJECTIVE: Vitals:   10/12/16 1059  BP: 129/69  Pulse: 91  Resp: 18  Temp: 98.4 F (36.9 C)     Body mass index is 26.32 kg/m.    ECOG FS:1 - Symptomatic but completely ambulatory  General: Well-developed, well-nourished, no acute distress. Eyes: Pink conjunctiva, anicteric sclera. Lungs: Clear to auscultation bilaterally. Heart: Regular rate and rhythm. No rubs, murmurs, or gallops. Abdomen: Soft, nontender, nondistended. No organomegaly noted, normoactive bowel sounds. Musculoskeletal: No edema, cyanosis, or clubbing. Neuro: Alert, answering all questions appropriately. Cranial nerves grossly intact. Skin: No rashes or petechiae noted. Psych: Normal affect.   LAB RESULTS:  No results found for: NA, K, CL, CO2, GLUCOSE, BUN, CREATININE, CALCIUM, PROT, ALBUMIN, AST, ALT, ALKPHOS, BILITOT, GFRNONAA, GFRAA  Lab Results  Component Value Date   WBC 9.2 10/10/2016   NEUTROABS 6.4 10/10/2016   HGB 9.4 (L) 10/10/2016   HCT 29.0 (L) 10/10/2016   MCV 91.7 10/10/2016   PLT 255 10/10/2016   Lab Results  Component Value Date   IRON 53 10/10/2016   TIBC 379 10/10/2016   IRONPCTSAT 14 10/10/2016   Lab Results  Component Value Date   FERRITIN 25 10/10/2016    STUDIES: Ct Entero Abd/pelvis W Contast  Result Date: 09/27/2016 CLINICAL DATA:  76 year old female with history of severe reflux and indigestion with diarrhea for the past 6 months to 1 year. History of right-sided breast cancer diagnosed in 1995 treated with mastectomy and chemotherapy. Weakness. EXAM: CT ABDOMEN AND  PELVIS WITH CONTRAST (ENTEROGRAPHY) TECHNIQUE: Multidetector CT of the abdomen and pelvis during bolus administration of intravenous contrast. Negative oral contrast was given. CONTRAST:  35m ISOVUE-300 IOPAMIDOL (ISOVUE-300) INJECTION 61% COMPARISON:  CT the abdomen and pelvis 11/11/2012. FINDINGS: Lower chest: Scattered areas of mild scarring in the visualized lung bases. Atherosclerotic calcifications in the right coronary artery. Calcifications of the mitral annulus. Mild cardiomegaly with left atrial dilatation. Several small distal esophageal varices are noted. Hepatobiliary: The liver has a shrunken appearance and nodular contour, indicative of underlying cirrhosis. No discrete cystic or solid hepatic lesions are identified. No intra or extrahepatic biliary ductal dilatation. Status post cholecystectomy. Pancreas: No pancreatic mass. No pancreatic ductal dilatation. No pancreatic or peripancreatic fluid or inflammatory changes. Spleen: Spleen is mildly enlarged measuring 12.5 x 6.0 x 15.5 cm (estimated splenic volume of 581 mL). Adrenals/Urinary Tract: In the anterolateral aspect of the interpolar region of the left kidney there is a 1.7 x 1.6 x 1.7 cm enhancing lesion, highly concerning for renal cell carcinoma. This is slightly  exophytic but is encapsulated within Gerota's fascia and is well separated from the left renal vein which is widely patent. Right kidney and bilateral adrenal glands are normal in appearance. No hydroureteronephrosis. Urinary bladder is nearly completely decompressed, but otherwise unremarkable in appearance. Stomach/Bowel: The appearance of the stomach is normal. There is no pathologic dilatation of small bowel or colon. There is some apparent mass-like thickening of the distal rectum, best appreciated on axial image 206 of series 7 and coronal image 92 of series 602, which could simply be a reflection of under distention of the rectum in this region, however, underlying rectal  neoplasm is not excluded. Several colonic diverticulae are noted, without surrounding inflammatory changes to suggest an acute diverticulitis at this time. The appendix is not confidently identified and may be surgically absent. Regardless, there are no inflammatory changes noted adjacent to the cecum to suggest the presence of an acute appendicitis at this time. Vascular/Lymphatic: Aortic atherosclerosis, without evidence of aneurysm or dissection in the abdominal or pelvic vasculature. Portal vein is dilated measuring up to 18 mm in diameter. Numerous portosystemic collateral pathways, including recannulized paraumbilical vein, left-sided splenorenal collaterals and small distal esophageal varices. No lymphadenopathy noted in the abdomen or pelvis. Reproductive: Uterus and ovaries are unremarkable in appearance. Other: Small volume of ascites.  No pneumoperitoneum. Musculoskeletal: There are no aggressive appearing lytic or blastic lesions noted in the visualized portions of the skeleton. IMPRESSION: 1. Mass-like thickening of the distal rectum concerning for potential rectal neoplasm. This could be artifactual related to under distention of the rectum on today's examination, however, further evaluation with nonemergent colonoscopy is strongly recommended in the near future to exclude neoplasm. 2. Stigmata of cirrhosis with portal hypertension, including dilated portal vein, splenomegaly and numerous portosystemic collateral pathways (including small distal esophageal varices), as above. 3. **An incidental finding of potential clinical significance has been found. 1.7 x 1.6 x 1.7 cm enhancing lesion in the anterior aspect of the interpolar region of the left kidney is highly concerning for small renal cell carcinoma. Nonemergent Urologic consultation is strongly recommended.** 4. Small volume of ascites. 5. Aortic atherosclerosis, in addition to at least right coronary artery disease. Assessment for potential risk  factor modification, dietary therapy or pharmacologic therapy may be warranted, if clinically indicated. 6. Colonic diverticulosis without evidence of acute diverticulitis at this time. 7. Mild cardiomegaly with left atrial dilatation. 8. Additional incidental findings, as above. Electronically Signed   By: Vinnie Langton M.D.   On: 09/27/2016 11:16    ASSESSMENT: Iron deficiency anemia.  PLAN:    1. Iron deficiency anemia: Patient's hemoglobin is decreased, but essentially unchanged. She is now relatively asymptomatic. Iron stores are within normal limits today. Previously, the remainder of her blood work is either negative or within normal limits. No intervention is needed at this time. Patient last received IV iron on April 26, 2016. Return to clinic in 3 months with repeat laboratory work and further evaluation. 2. Masslike thickening and distal rectum: This is seen on CT scan, but recent colonoscopy was negative. 3. Left kidney mass: Patient has appointment with urology in the near future. 4. Hypertension: Continue current medications as prescribed.  Approximately 30 minutes was spent in discussion of her transient 50% was consultation.  Patient expressed understanding and was in agreement with this plan. She also understands that She can call clinic at any time with any questions, concerns, or complaints.    Lloyd Huger, MD 10/12/16 11:37 AM

## 2016-10-18 DIAGNOSIS — N183 Chronic kidney disease, stage 3 (moderate): Secondary | ICD-10-CM | POA: Diagnosis not present

## 2016-11-02 ENCOUNTER — Encounter: Payer: Self-pay | Admitting: Urology

## 2016-11-02 ENCOUNTER — Ambulatory Visit (INDEPENDENT_AMBULATORY_CARE_PROVIDER_SITE_OTHER): Payer: Medicare HMO | Admitting: Urology

## 2016-11-02 VITALS — BP 137/60 | HR 94 | Ht 64.0 in | Wt 157.0 lb

## 2016-11-02 DIAGNOSIS — N2889 Other specified disorders of kidney and ureter: Secondary | ICD-10-CM

## 2016-11-02 DIAGNOSIS — E1121 Type 2 diabetes mellitus with diabetic nephropathy: Secondary | ICD-10-CM | POA: Insufficient documentation

## 2016-11-02 NOTE — Progress Notes (Signed)
11/02/2016 2:24 PM   [name] 1940/12/03 [id number]  Referring provider: Sofie Hartigan, MD [place], [place] [address]  Chief Complaint  Patient presents with  . New Patient (Initial Visit)    Renal Mass    HPI: 76 yo F Referred for further evaluation of an incidental renal mass identified on CT abdomen pelvis with contrast on 09/27/2016.  A 1.7 x 1.6 x 1.7 enhancing renal lesion was seen in the interpolar aspect of the left kidney, exophytic concerning for possible renal cell carcinoma. This was not appreciated on previous scan on 11/2012.  No dysuria or gross hematuria.    Her son was dx with kidney cancer s/p nephrectomy last year.    Remote history of breast cancer 1995 s/p mastectomy, chemo and tamoxifen therapy x 5 years, NED.     PMH: Past Medical History:  Diagnosis Date  . Cancer [facility])    breast cancer right mastectomy  . Diabetes mellitus without complication ([place])   . Glaucoma   . Hyperlipidemia   . Hypertension   . Neuropathy   . Osteoarthritis   . Rheumatoid arthritis ([place])   . Telangiectasias     Surgical History: Past Surgical History:  Procedure Laterality Date  . BREAST SURGERY     right mastectomy  . CHOLECYSTECTOMY    . COLONOSCOPY    . COLONOSCOPY WITH PROPOFOL N/A 08/14/2016   Procedure: COLONOSCOPY WITH PROPOFOL;  Surgeon: Lollie Sails, MD;  Location: [facility] ENDOSCOPY;  Service: Endoscopy;  Laterality: N/A;  . ESOPHAGOGASTRODUODENOSCOPY (EGD) WITH PROPOFOL N/A 08/14/2016   Procedure: ESOPHAGOGASTRODUODENOSCOPY (EGD) WITH PROPOFOL;  Surgeon: Lollie Sails, MD;  Location: [facility] ENDOSCOPY;  Service: Endoscopy;  Laterality: N/A;  . EXCISIONAL HEMORRHOIDECTOMY      Home Medications:  Allergies as of 11/02/2016   No Known Allergies     Medication List       Accurate as of 11/02/16 11:59 PM. Always use your most recent med list.          albuterol 108 (90 Base) MCG/ACT inhaler Commonly known as:  PROVENTIL  HFA;VENTOLIN HFA Inhale into the lungs.   ammonium lactate 12 % lotion Commonly known as:  LAC-HYDRIN Apply topically.   aspirin 81 MG EC tablet Take 81 mg by mouth daily. Swallow whole.   DOCOSAHEXAENOIC ACID PO Take 1,200 mg by mouth.   ferrous fumarate 325 (106 Fe) MG Tabs tablet Commonly known as:  HEMOCYTE - 106 mg FE Take 1 tablet by mouth daily.   FISH OIL PO Take 1 tablet by mouth.   furosemide 40 MG tablet Commonly known as:  LASIX Take 40 mg by mouth.   gabapentin 300 MG capsule Commonly known as:  NEURONTIN Take 600 mg by mouth 2 (two) times daily.   glipiZIDE 10 MG tablet Commonly known as:  GLUCOTROL Take 10 mg by mouth daily before breakfast.   LANTUS SOLOSTAR 100 UNIT/ML Solostar Pen Generic drug:  Insulin Glargine Inject 20 Units into the skin.   loratadine 10 MG tablet Commonly known as:  CLARITIN Take 10 mg by mouth daily.   magnesium oxide 400 MG tablet Commonly known as:  MAG-OX Take 800 mg by mouth.   metFORMIN 850 MG tablet Commonly known as:  GLUCOPHAGE Take 500 mg by mouth 3 (three) times daily.   niacin 500 MG CR capsule Take 500 mg by mouth at bedtime.   RABEprazole 20 MG tablet Commonly known as:  ACIPHEX Take by mouth.  sitaGLIPtin 50 MG tablet Commonly known as:  JANUVIA Take 50 mg by mouth daily.   vitamin B-12 1000 MCG tablet Commonly known as:  CYANOCOBALAMIN Take 1,000 mcg by mouth daily.   Vitamin D2 2000 units Tabs Take 1 tablet by mouth daily.       Allergies: No Known Allergies  Family History: Family History  Problem Relation Age of Onset  . Kidney cancer Son   . Bladder Cancer Neg Hx     Social History:  reports that she has quit smoking. She has never used smokeless tobacco. She reports that she does not drink alcohol or use drugs.  ROS: UROLOGY Frequent Urination?: No Hard to postpone urination?: No Burning/pain with urination?: No Get up at night to urinate?: Yes Leakage of urine?:  No Urine stream starts and stops?: No Trouble starting stream?: No Do you have to strain to urinate?: No Blood in urine?: No Urinary tract infection?: No Sexually transmitted disease?: No Injury to kidneys or bladder?: No Painful intercourse?: No Weak stream?: No Currently pregnant?: No Vaginal bleeding?: No Last menstrual period?: 1986  Gastrointestinal Nausea?: No Vomiting?: No Indigestion/heartburn?: Yes Diarrhea?: Yes Constipation?: No  Constitutional Fever: No Night sweats?: No Weight loss?: No Fatigue?: No  Skin Skin rash/lesions?: Yes Itching?: Yes  Eyes Blurred vision?: No Double vision?: No  Ears/Nose/Throat Sore throat?: No Sinus problems?: No  Hematologic/Lymphatic Swollen glands?: No Easy bruising?: Yes  Cardiovascular Leg swelling?: No Chest pain?: No  Respiratory Cough?: No Shortness of breath?: No  Endocrine Excessive thirst?: No  Musculoskeletal Back pain?: No Joint pain?: Yes  Neurological Headaches?: No Dizziness?: No  Psychologic Depression?: No Anxiety?: No  Physical Exam: BP 137/60   Pulse 94   Ht 5' 4"  (1.626 m)   Wt 157 lb (71.2 kg)   BMI 26.95 kg/m   Constitutional:  Alert and oriented, No acute distress. HEENT: [place] AT, moist mucus membranes.  Trachea midline, no masses. Cardiovascular: No clubbing, cyanosis, or edema. Respiratory: Normal respiratory effort, no increased work of breathing. GI: Abdomen is soft, nontender, nondistended, no abdominal masses GU: No CVA tenderness.  Skin: No rashes, bruises or suspicious lesions. Lymph: No cervical or inguinal adenopathy. Neurologic: Grossly intact, no focal deficits, moving all 4 extremities. Psychiatric: Normal mood and affect.  Laboratory Data: Lab Results  Component Value Date   WBC 9.2 10/10/2016   HGB 9.4 (L) 10/10/2016   HCT 29.0 (L) 10/10/2016   MCV 91.7 10/10/2016   PLT 255 10/10/2016    Cr 1.4 on 10/18/16  Urinalysis No results found for:  COLORURINE, APPEARANCEUR, LABSPEC, PHURINE, GLUCOSEU, HGBUR, BILIRUBINUR, KETONESUR, PROTEINUR, UROBILINOGEN, NITRITE, LEUKOCYTESUR  Pertinent Imaging: CLINICAL DATA:  76 year old female with history of severe reflux and indigestion with diarrhea for the past 6 months to 1 year. History of right-sided breast cancer diagnosed in 1995 treated with mastectomy and chemotherapy. Weakness.  EXAM: CT ABDOMEN AND PELVIS WITH CONTRAST (ENTEROGRAPHY)  TECHNIQUE: Multidetector CT of the abdomen and pelvis during bolus administration of intravenous contrast. Negative oral contrast was given.  CONTRAST:  73m ISOVUE-300 IOPAMIDOL (ISOVUE-300) INJECTION 61%  COMPARISON:  CT the abdomen and pelvis 11/11/2012.  FINDINGS: Lower chest: Scattered areas of mild scarring in the visualized lung bases. Atherosclerotic calcifications in the right coronary artery. Calcifications of the mitral annulus. Mild cardiomegaly with left atrial dilatation. Several small distal esophageal varices are noted.  Hepatobiliary: The liver has a shrunken appearance and nodular contour, indicative of underlying cirrhosis. No discrete cystic or solid hepatic lesions are  identified. No intra or extrahepatic biliary ductal dilatation. Status post cholecystectomy.  Pancreas: No pancreatic mass. No pancreatic ductal dilatation. No pancreatic or peripancreatic fluid or inflammatory changes.  Spleen: Spleen is mildly enlarged measuring 12.5 x 6.0 x 15.5 cm (estimated splenic volume of 581 mL).  Adrenals/Urinary Tract: In the anterolateral aspect of the interpolar region of the left kidney there is a 1.7 x 1.6 x 1.7 cm enhancing lesion, highly concerning for renal cell carcinoma. This is slightly exophytic but is encapsulated within Gerota's fascia and is well separated from the left renal vein which is widely patent. Right kidney and bilateral adrenal glands are normal in appearance. No hydroureteronephrosis.  Urinary bladder is nearly completely decompressed, but otherwise unremarkable in appearance.  Stomach/Bowel: The appearance of the stomach is normal. There is no pathologic dilatation of small bowel or colon. There is some apparent mass-like thickening of the distal rectum, best appreciated on axial image 206 of series 7 and coronal image 92 of series 602, which could simply be a reflection of under distention of the rectum in this region, however, underlying rectal neoplasm is not excluded. Several colonic diverticulae are noted, without surrounding inflammatory changes to suggest an acute diverticulitis at this time. The appendix is not confidently identified and may be surgically absent. Regardless, there are no inflammatory changes noted adjacent to the cecum to suggest the presence of an acute appendicitis at this time.  Vascular/Lymphatic: Aortic atherosclerosis, without evidence of aneurysm or dissection in the abdominal or pelvic vasculature. Portal vein is dilated measuring up to 18 mm in diameter. Numerous portosystemic collateral pathways, including recannulized paraumbilical vein, left-sided splenorenal collaterals and small distal esophageal varices. No lymphadenopathy noted in the abdomen or pelvis.  Reproductive: Uterus and ovaries are unremarkable in appearance.  Other: Small volume of ascites.  No pneumoperitoneum.  Musculoskeletal: There are no aggressive appearing lytic or blastic lesions noted in the visualized portions of the skeleton.  IMPRESSION: 1. Mass-like thickening of the distal rectum concerning for potential rectal neoplasm. This could be artifactual related to under distention of the rectum on today's examination, however, further evaluation with nonemergent colonoscopy is strongly recommended in the near future to exclude neoplasm. 2. Stigmata of cirrhosis with portal hypertension, including dilated portal vein, splenomegaly and numerous  portosystemic collateral pathways (including small distal esophageal varices), as above. 3. **An incidental finding of potential clinical significance has been found. 1.7 x 1.6 x 1.7 cm enhancing lesion in the anterior aspect of the interpolar region of the left kidney is highly concerning for small renal cell carcinoma. Nonemergent Urologic consultation is strongly recommended.** 4. Small volume of ascites. 5. Aortic atherosclerosis, in addition to at least right coronary artery disease. Assessment for potential risk factor modification, dietary therapy or pharmacologic therapy may be warranted, if clinically indicated. 6. Colonic diverticulosis without evidence of acute diverticulitis at this time. 7. Mild cardiomegaly with left atrial dilatation. 8. Additional incidental findings, as above.   Electronically Signed   By: Vinnie Langton M.D.   On: 09/27/2016 11:16  CT scan was personally reviewed today with the patient. This was also partially compared to previous scan performed on 11/2012.  Assessment & Plan:    1. Left renal mass 1.7 cm possibly enhancing renal mass, family history of renal cell carcinoma. Patient appears new since 2014.  A solid renal mass raises the suspicion of primary renal malignancy.  We discussed this in detail and in regards to the spectrum of renal masses which includes cysts (pure cysts  are considered benign), solid masses and everything in between. The risk of metastasis increases as the size of solid renal mass increases. In general, it is believed that the risk of metastasis for renal masses less than 3-4 cm is small (up to approximately 5%) based mainly on large retrospective studies.  In some cases and especially in patients of older age and multiple comorbidities a surveillance approach may be appropriate. The treatment of solid renal masses includes: surveillance, cryoablation (percutaneous and laparoscopic) in addition to partial and complete  nephrectomy (each with option of laparoscopic, robotic and open depending on appropriateness). Furthermore, nephrectomy appears to be an independent risk factor for the development of chronic kidney disease suggesting that nephron sparing approaches should be implored whenever feasible. We reviewed these options in context of the patients current situation as well as the pros and cons of each.  We reviewed her CT scan personally together. I have recommended fairly close follow-up with a dedicated CT abdomen with and without contrast to further characterize the renal mass as well as assess for any interval growth   She is agreeable to this plan and understands the extreme importance of close follow-up.  - CT Abd Wo & W Cm; Future   Return in about 3 months (around 02/01/2017) for f/u CT abd .  Hollice Espy, MD  [facility] [address] [place], [place] [address] [phone]

## 2016-11-20 DIAGNOSIS — D509 Iron deficiency anemia, unspecified: Secondary | ICD-10-CM | POA: Diagnosis not present

## 2016-11-20 DIAGNOSIS — K7460 Unspecified cirrhosis of liver: Secondary | ICD-10-CM | POA: Diagnosis not present

## 2016-11-20 DIAGNOSIS — K621 Rectal polyp: Secondary | ICD-10-CM | POA: Diagnosis not present

## 2016-11-20 DIAGNOSIS — K210 Gastro-esophageal reflux disease with esophagitis: Secondary | ICD-10-CM | POA: Diagnosis not present

## 2016-11-21 DIAGNOSIS — A048 Other specified bacterial intestinal infections: Secondary | ICD-10-CM | POA: Diagnosis not present

## 2016-11-21 DIAGNOSIS — K644 Residual hemorrhoidal skin tags: Secondary | ICD-10-CM | POA: Diagnosis not present

## 2016-12-12 DIAGNOSIS — E782 Mixed hyperlipidemia: Secondary | ICD-10-CM | POA: Diagnosis not present

## 2016-12-12 DIAGNOSIS — I10 Essential (primary) hypertension: Secondary | ICD-10-CM | POA: Diagnosis not present

## 2016-12-12 DIAGNOSIS — D508 Other iron deficiency anemias: Secondary | ICD-10-CM | POA: Diagnosis not present

## 2016-12-12 DIAGNOSIS — E1142 Type 2 diabetes mellitus with diabetic polyneuropathy: Secondary | ICD-10-CM | POA: Diagnosis not present

## 2016-12-12 DIAGNOSIS — Z794 Long term (current) use of insulin: Secondary | ICD-10-CM | POA: Diagnosis not present

## 2017-01-08 ENCOUNTER — Other Ambulatory Visit: Payer: Self-pay

## 2017-01-08 ENCOUNTER — Inpatient Hospital Stay: Payer: Medicare HMO | Attending: Oncology

## 2017-01-08 DIAGNOSIS — H409 Unspecified glaucoma: Secondary | ICD-10-CM | POA: Diagnosis not present

## 2017-01-08 DIAGNOSIS — Z853 Personal history of malignant neoplasm of breast: Secondary | ICD-10-CM | POA: Diagnosis not present

## 2017-01-08 DIAGNOSIS — Z9011 Acquired absence of right breast and nipple: Secondary | ICD-10-CM | POA: Insufficient documentation

## 2017-01-08 DIAGNOSIS — Z794 Long term (current) use of insulin: Secondary | ICD-10-CM | POA: Insufficient documentation

## 2017-01-08 DIAGNOSIS — Z87891 Personal history of nicotine dependence: Secondary | ICD-10-CM | POA: Diagnosis not present

## 2017-01-08 DIAGNOSIS — I10 Essential (primary) hypertension: Secondary | ICD-10-CM | POA: Diagnosis not present

## 2017-01-08 DIAGNOSIS — Z7982 Long term (current) use of aspirin: Secondary | ICD-10-CM | POA: Insufficient documentation

## 2017-01-08 DIAGNOSIS — E785 Hyperlipidemia, unspecified: Secondary | ICD-10-CM | POA: Insufficient documentation

## 2017-01-08 DIAGNOSIS — Z79899 Other long term (current) drug therapy: Secondary | ICD-10-CM | POA: Insufficient documentation

## 2017-01-08 DIAGNOSIS — D508 Other iron deficiency anemias: Secondary | ICD-10-CM

## 2017-01-08 DIAGNOSIS — E119 Type 2 diabetes mellitus without complications: Secondary | ICD-10-CM | POA: Insufficient documentation

## 2017-01-08 DIAGNOSIS — N2889 Other specified disorders of kidney and ureter: Secondary | ICD-10-CM | POA: Diagnosis not present

## 2017-01-08 DIAGNOSIS — M069 Rheumatoid arthritis, unspecified: Secondary | ICD-10-CM | POA: Diagnosis not present

## 2017-01-08 DIAGNOSIS — D509 Iron deficiency anemia, unspecified: Secondary | ICD-10-CM | POA: Insufficient documentation

## 2017-01-08 LAB — CBC WITH DIFFERENTIAL/PLATELET
BASOS ABS: 0.1 10*3/uL (ref 0–0.1)
Basophils Relative: 1 %
Eosinophils Absolute: 3.6 10*3/uL — ABNORMAL HIGH (ref 0–0.7)
Eosinophils Relative: 41 %
HCT: 31.5 % — ABNORMAL LOW (ref 35.0–47.0)
HEMOGLOBIN: 10.5 g/dL — AB (ref 12.0–16.0)
LYMPHS ABS: 1 10*3/uL (ref 1.0–3.6)
LYMPHS PCT: 11 %
MCH: 31.6 pg (ref 26.0–34.0)
MCHC: 33.3 g/dL (ref 32.0–36.0)
MCV: 95.1 fL (ref 80.0–100.0)
Monocytes Absolute: 0.5 10*3/uL (ref 0.2–0.9)
Monocytes Relative: 6 %
NEUTROS PCT: 41 %
Neutro Abs: 3.7 10*3/uL (ref 1.4–6.5)
Platelets: 220 10*3/uL (ref 150–440)
RBC: 3.31 MIL/uL — AB (ref 3.80–5.20)
RDW: 16.1 % — ABNORMAL HIGH (ref 11.5–14.5)
WBC: 8.9 10*3/uL (ref 3.6–11.0)

## 2017-01-08 LAB — FERRITIN: Ferritin: 30 ng/mL (ref 11–307)

## 2017-01-08 LAB — IRON AND TIBC
Iron: 51 ug/dL (ref 28–170)
SATURATION RATIOS: 13 % (ref 10.4–31.8)
TIBC: 399 ug/dL (ref 250–450)
UIBC: 348 ug/dL

## 2017-01-08 NOTE — Progress Notes (Signed)
[place]  Telephone:([phone] Fax:(336) [phone]  ID: [name] OB: August 02, 1940  MR#: [id number]  TDH#:[id number]  Patient Care Team: Sofie Hartigan, MD as PCP - General (Family Medicine)  CHIEF COMPLAINT: Iron deficiency anemia.  INTERVAL HISTORY: Patient returns to clinic today for repeat laboratory work and further evaluation. She does not complain of weakness or fatigue today. She currently feels well and is asymptomatic. She denies any pain. She admits to occasional dyspnea on exertion, but denies any chest pain, cough, or hemoptysis. She denies any nausea, vomiting, diarrhea, or constipation. She has no melena or hematochezia. She has no urinary complaints. Patient offers no further specific complaints today.  REVIEW OF SYSTEMS:   Review of Systems  Constitutional: Negative for fever, malaise/fatigue and weight loss.  HENT: Negative for congestion.   Respiratory: Positive for shortness of breath. Negative for cough and hemoptysis.   Cardiovascular: Negative.  Negative for chest pain and leg swelling.  Gastrointestinal: Negative for abdominal pain, blood in stool, constipation, diarrhea, nausea and vomiting.  Genitourinary: Negative.  Negative for dysuria, frequency and urgency.  Musculoskeletal: Negative.   Neurological: Positive for tremors. Negative for tingling, weakness and headaches.  Psychiatric/Behavioral: Negative for depression. The patient is not nervous/anxious.    As per HPI. Otherwise, a complete review of systems is negative.  PAST MEDICAL HISTORY: Past Medical History:  Diagnosis Date  . Cancer [facility])    breast cancer right mastectomy  . Diabetes mellitus without complication ([place])   . Glaucoma   . Hyperlipidemia   . Hypertension   . Neuropathy   . Osteoarthritis   . Rheumatoid arthritis ([place])   . Telangiectasias     PAST SURGICAL HISTORY: Past Surgical History:  Procedure Laterality Date  . BREAST SURGERY     right  mastectomy  . CHOLECYSTECTOMY    . COLONOSCOPY    . COLONOSCOPY WITH PROPOFOL N/A 08/14/2016   Procedure: COLONOSCOPY WITH PROPOFOL;  Surgeon: Lollie Sails, MD;  Location: [facility] ENDOSCOPY;  Service: Endoscopy;  Laterality: N/A;  . ESOPHAGOGASTRODUODENOSCOPY (EGD) WITH PROPOFOL N/A 08/14/2016   Procedure: ESOPHAGOGASTRODUODENOSCOPY (EGD) WITH PROPOFOL;  Surgeon: Lollie Sails, MD;  Location: [facility] ENDOSCOPY;  Service: Endoscopy;  Laterality: N/A;  . EXCISIONAL HEMORRHOIDECTOMY      FAMILY HISTORY: Reviewed and unchanged. No reported history of malignancy or chronic disease.  ADVANCED DIRECTIVES (Y/N):  N  HEALTH MAINTENANCE: Social History  Substance Use Topics  . Smoking status: Former Research scientist (life sciences)  . Smokeless tobacco: Never Used  . Alcohol use No     Colonoscopy:  PAP:  Bone density:  Lipid panel:  No Known Allergies  Current Outpatient Prescriptions  Medication Sig Dispense Refill  . albuterol (PROVENTIL HFA;VENTOLIN HFA) 108 (90 Base) MCG/ACT inhaler Inhale into the lungs.    Marland Kitchen ammonium lactate (LAC-HYDRIN) 12 % lotion Apply topically.    Marland Kitchen aspirin 81 MG EC tablet Take 81 mg by mouth daily. Swallow whole.    . DOCOSAHEXAENOIC ACID PO Take 1,200 mg by mouth.    . Ergocalciferol (VITAMIN D2) 2000 units TABS Take 1 tablet by mouth daily.    . ferrous fumarate (HEMOCYTE - 106 MG FE) 325 (106 Fe) MG TABS tablet Take 1 tablet by mouth daily.    Marland Kitchen gabapentin (NEURONTIN) 300 MG capsule Take 600 mg by mouth 2 (two) times daily.     Marland Kitchen glipiZIDE (GLUCOTROL) 10 MG tablet Take 10 mg by mouth daily before breakfast.    . Insulin Glargine (LANTUS SOLOSTAR)  100 UNIT/ML Solostar Pen Inject 20 Units into the skin.     Marland Kitchen loratadine (CLARITIN) 10 MG tablet Take 10 mg by mouth daily.    . magnesium oxide (MAG-OX) 400 MG tablet Take 800 mg by mouth.    . metFORMIN (GLUCOPHAGE) 850 MG tablet Take 500 mg by mouth 3 (three) times daily.     . niacin 500 MG CR capsule Take 500 mg by mouth at  bedtime.    . Omega-3 Fatty Acids (FISH OIL PO) Take 1 tablet by mouth.    . RABEprazole (ACIPHEX) 20 MG tablet Take by mouth.    . sitaGLIPtin (JANUVIA) 50 MG tablet Take 50 mg by mouth daily.    . vitamin B-12 (CYANOCOBALAMIN) 1000 MCG tablet Take 1,000 mcg by mouth daily.    . furosemide (LASIX) 40 MG tablet Take 40 mg by mouth.     No current facility-administered medications for this visit.     OBJECTIVE: Vitals:   01/11/17 1007  BP: 138/68  Pulse: 92  Resp: 20  Temp: 97.7 F (36.5 C)     Body mass index is 28.34 kg/m.    ECOG FS:1 - Symptomatic but completely ambulatory  General: Well-developed, well-nourished, no acute distress. Eyes: Pink conjunctiva, anicteric sclera. Lungs: Clear to auscultation bilaterally. Heart: Regular rate and rhythm. No rubs, murmurs, or gallops. Abdomen: Soft, nontender, nondistended. No organomegaly noted, normoactive bowel sounds. Musculoskeletal: No edema, cyanosis, or clubbing. Neuro: Alert, answering all questions appropriately. Cranial nerves grossly intact. Skin: No rashes or petechiae noted. Psych: Normal affect.   LAB RESULTS:  No results found for: NA, K, CL, CO2, GLUCOSE, BUN, CREATININE, CALCIUM, PROT, ALBUMIN, AST, ALT, ALKPHOS, BILITOT, GFRNONAA, GFRAA  Lab Results  Component Value Date   WBC 8.9 01/08/2017   NEUTROABS 3.7 01/08/2017   HGB 10.5 (L) 01/08/2017   HCT 31.5 (L) 01/08/2017   MCV 95.1 01/08/2017   PLT 220 01/08/2017   Lab Results  Component Value Date   IRON 51 01/08/2017   TIBC 399 01/08/2017   IRONPCTSAT 13 01/08/2017   Lab Results  Component Value Date   FERRITIN 30 01/08/2017    STUDIES: No results found.  ASSESSMENT: Iron deficiency anemia.  PLAN:    1. Iron deficiency anemia: Patient's hemoglobin is decreased, but improved from 3 months ago. Her iron stores continue to be within normal limits. Previously, the remainder of her blood work is either negative or within normal limits. No  intervention is needed at this time. Patient last received IV iron on April 26, 2016. Return to clinic in 4 months with repeat laboratory work and further evaluation. 2. Masslike thickening and distal rectum: This is seen on CT scan, but colonoscopy was negative. 3. Left kidney mass: Continue follow-up with urology as indicated. 4. Hypertension: Patient's blood pressure within normal limits today.  Continue current medications as prescribed.   Patient expressed understanding and was in agreement with this plan. She also understands that She can call clinic at any time with any questions, concerns, or complaints.    Lloyd Huger, MD 01/11/17 2:44 PM

## 2017-01-11 ENCOUNTER — Inpatient Hospital Stay (HOSPITAL_BASED_OUTPATIENT_CLINIC_OR_DEPARTMENT_OTHER): Payer: Medicare HMO | Admitting: Oncology

## 2017-01-11 ENCOUNTER — Inpatient Hospital Stay: Payer: Medicare HMO

## 2017-01-11 VITALS — BP 138/68 | HR 92 | Temp 97.7°F | Resp 20 | Wt 165.1 lb

## 2017-01-11 DIAGNOSIS — Z9011 Acquired absence of right breast and nipple: Secondary | ICD-10-CM

## 2017-01-11 DIAGNOSIS — N2889 Other specified disorders of kidney and ureter: Secondary | ICD-10-CM

## 2017-01-11 DIAGNOSIS — D509 Iron deficiency anemia, unspecified: Secondary | ICD-10-CM

## 2017-01-11 DIAGNOSIS — Z853 Personal history of malignant neoplasm of breast: Secondary | ICD-10-CM

## 2017-01-11 DIAGNOSIS — D508 Other iron deficiency anemias: Secondary | ICD-10-CM

## 2017-01-11 DIAGNOSIS — H409 Unspecified glaucoma: Secondary | ICD-10-CM | POA: Diagnosis not present

## 2017-01-11 DIAGNOSIS — E785 Hyperlipidemia, unspecified: Secondary | ICD-10-CM | POA: Diagnosis not present

## 2017-01-11 DIAGNOSIS — I10 Essential (primary) hypertension: Secondary | ICD-10-CM | POA: Diagnosis not present

## 2017-01-11 DIAGNOSIS — M069 Rheumatoid arthritis, unspecified: Secondary | ICD-10-CM | POA: Diagnosis not present

## 2017-01-11 DIAGNOSIS — E119 Type 2 diabetes mellitus without complications: Secondary | ICD-10-CM | POA: Diagnosis not present

## 2017-01-11 NOTE — Progress Notes (Signed)
Patient reports she is recovering from an episode of shingles, denies any other concerns today.

## 2017-01-29 ENCOUNTER — Telehealth: Payer: Self-pay | Admitting: Urology

## 2017-01-29 DIAGNOSIS — N2889 Other specified disorders of kidney and ureter: Secondary | ICD-10-CM

## 2017-01-29 NOTE — Telephone Encounter (Signed)
Can you out in an order for labs in [facility] for creatine, patient is scheduled for a CT scan in the morning. Please and thank you  Sharyn Lull

## 2017-01-29 NOTE — Telephone Encounter (Signed)
Orders placed.

## 2017-01-30 ENCOUNTER — Ambulatory Visit
Admission: RE | Admit: 2017-01-30 | Discharge: 2017-01-30 | Disposition: A | Discharge status: Home / Self Care | Payer: Medicare HMO | Source: Ambulatory Visit | Attending: Urology | Admitting: Urology

## 2017-01-30 ENCOUNTER — Other Ambulatory Visit
Admission: RE | Admit: 2017-01-30 | Discharge: 2017-01-30 | Disposition: A | Discharge status: Home / Self Care | Payer: Medicare HMO | Source: Ambulatory Visit | Attending: Urology | Admitting: Urology

## 2017-01-30 DIAGNOSIS — K766 Portal hypertension: Secondary | ICD-10-CM | POA: Diagnosis not present

## 2017-01-30 DIAGNOSIS — N2889 Other specified disorders of kidney and ureter: Secondary | ICD-10-CM | POA: Insufficient documentation

## 2017-01-30 DIAGNOSIS — I700 Atherosclerosis of aorta: Secondary | ICD-10-CM | POA: Insufficient documentation

## 2017-01-30 DIAGNOSIS — R161 Splenomegaly, not elsewhere classified: Secondary | ICD-10-CM | POA: Diagnosis not present

## 2017-01-30 DIAGNOSIS — K7460 Unspecified cirrhosis of liver: Secondary | ICD-10-CM | POA: Insufficient documentation

## 2017-01-30 LAB — CREATININE, SERUM
CREATININE: 1.52 mg/dL — AB (ref 0.44–1.00)
GFR calc Af Amer: 38 mL/min — ABNORMAL LOW (ref >60)
GFR, EST NON AFRICAN AMERICAN: 32 mL/min — AB (ref >60)

## 2017-01-30 LAB — BUN: BUN: 24 mg/dL — ABNORMAL HIGH (ref 6–20)

## 2017-01-30 MED ORDER — IOPAMIDOL (ISOVUE-370) INJECTION 76%
100.0000 mL | Freq: Once | INTRAVENOUS | Status: AC | PRN
  Administered 2017-01-30: 80 mL via INTRAVENOUS

## 2017-02-01 ENCOUNTER — Encounter: Payer: Self-pay | Admitting: Urology

## 2017-02-01 ENCOUNTER — Ambulatory Visit (INDEPENDENT_AMBULATORY_CARE_PROVIDER_SITE_OTHER): Payer: Medicare HMO | Admitting: Urology

## 2017-02-01 VITALS — BP 115/59 | HR 89 | Ht 64.5 in | Wt 167.0 lb

## 2017-02-01 DIAGNOSIS — N2889 Other specified disorders of kidney and ureter: Secondary | ICD-10-CM

## 2017-02-01 NOTE — Progress Notes (Signed)
02/01/2017 2:35 PM   [name] 01-Jul-1941 [id number]  Referring provider: Sofie Hartigan, MD [place] [place], [place] [address]  Chief Complaint  Patient presents with  . Follow-up    3 month renal mass  . Results    CT    HPI: 76 yo F with incidental renal mass who returns today to follow up in definitive imaging.  She was initially seen in 10/2016 at which time a 1.7 x 1.6 cm enhancing interpolar left renal mass was identified.  She underwent follow-up CT abdomen with and without contrast on 01/30/2017 which showed the lesion had enlarged slightly to 1.9 x 1.8 cm which is most consistent with a Bosniak 3 lesion, primarily cystic with thick enhancing  wall.  This was not appreciated on previous scan on 11/2012.  No dysuria or gross hematuria.    Her son was dx with kidney cancer s/p nephrectomy last year.    Remote history of breast cancer 1995 s/p mastectomy, chemo and tamoxifen therapy x 5 years, NED.    She has been seen and evaluated by gastroenterology for changes consistent with cirrhosis.   PMH: Past Medical History:  Diagnosis Date  . Cancer [facility])    breast cancer right mastectomy  . Diabetes mellitus without complication ([place])   . Glaucoma   . Hyperlipidemia   . Hypertension   . Neuropathy   . Osteoarthritis   . Rheumatoid arthritis ([place])   . Telangiectasias     Surgical History: Past Surgical History:  Procedure Laterality Date  . BREAST SURGERY     right mastectomy  . CHOLECYSTECTOMY    . COLONOSCOPY    . COLONOSCOPY WITH PROPOFOL N/A 08/14/2016   Procedure: COLONOSCOPY WITH PROPOFOL;  Surgeon: Lollie Sails, MD;  Location: [facility] ENDOSCOPY;  Service: Endoscopy;  Laterality: N/A;  . ESOPHAGOGASTRODUODENOSCOPY (EGD) WITH PROPOFOL N/A 08/14/2016   Procedure: ESOPHAGOGASTRODUODENOSCOPY (EGD) WITH PROPOFOL;  Surgeon: Lollie Sails, MD;  Location: [facility] ENDOSCOPY;  Service: Endoscopy;  Laterality: N/A;  . EXCISIONAL HEMORRHOIDECTOMY       Home Medications:  Allergies as of 02/01/2017   No Known Allergies     Medication List       Accurate as of 02/01/17 11:59 PM. Always use your most recent med list.          albuterol 108 (90 Base) MCG/ACT inhaler Commonly known as:  PROVENTIL HFA;VENTOLIN HFA Inhale into the lungs.   ammonium lactate 12 % lotion Commonly known as:  LAC-HYDRIN Apply topically.   aspirin 81 MG EC tablet Take 81 mg by mouth daily. Swallow whole.   DOCOSAHEXAENOIC ACID PO Take 1,200 mg by mouth.   ferrous fumarate 325 (106 Fe) MG Tabs tablet Commonly known as:  HEMOCYTE - 106 mg FE Take 1 tablet by mouth daily.   FISH OIL PO Take 1 tablet by mouth.   furosemide 40 MG tablet Commonly known as:  LASIX Take 40 mg by mouth.   gabapentin 300 MG capsule Commonly known as:  NEURONTIN Take 600 mg by mouth 2 (two) times daily.   glipiZIDE 10 MG tablet Commonly known as:  GLUCOTROL Take 10 mg by mouth daily before breakfast.   LANTUS SOLOSTAR 100 UNIT/ML Solostar Pen Generic drug:  Insulin Glargine Inject 20 Units into the skin.   lisinopril 2.5 MG tablet Commonly known as:  PRINIVIL,ZESTRIL Take by mouth.   loratadine 10 MG tablet Commonly known as:  CLARITIN Take 10 mg by mouth daily.  magnesium oxide 400 MG tablet Commonly known as:  MAG-OX Take 800 mg by mouth.   metFORMIN 850 MG tablet Commonly known as:  GLUCOPHAGE Take 400 mg by mouth 3 (three) times daily.   niacin 500 MG CR capsule Take 500 mg by mouth at bedtime.   niacin 500 MG CR tablet Commonly known as:  NIASPAN Take by mouth.   PYLERA 140-125-125 MG capsule Generic drug:  bismuth-metronidazole-tetracycline Take 3 capsules by mouth 4 (four) times daily -  before meals and at bedtime.   RABEprazole 20 MG tablet Commonly known as:  ACIPHEX Take by mouth.   sitaGLIPtin 50 MG tablet Commonly known as:  JANUVIA Take 50 mg by mouth daily.   vitamin B-12 1000 MCG tablet Commonly known as:   CYANOCOBALAMIN Take 1,000 mcg by mouth daily.   Vitamin D2 2000 units Tabs Take 1 tablet by mouth daily.       Allergies: No Known Allergies  Family History: Family History  Problem Relation Age of Onset  . Kidney cancer Son   . Bladder Cancer Neg Hx     Social History:  reports that she quit smoking about 5 years ago. She has never used smokeless tobacco. She reports that she does not drink alcohol or use drugs.  ROS: UROLOGY Frequent Urination?: No Hard to postpone urination?: No Burning/pain with urination?: No Get up at night to urinate?: Yes Leakage of urine?: Yes Urine stream starts and stops?: No Trouble starting stream?: No Do you have to strain to urinate?: No Blood in urine?: No Urinary tract infection?: No Sexually transmitted disease?: No Injury to kidneys or bladder?: No Painful intercourse?: No Weak stream?: No Currently pregnant?: No Vaginal bleeding?: No Last menstrual period?: n  Gastrointestinal Nausea?: No Vomiting?: No Indigestion/heartburn?: No Diarrhea?: Yes Constipation?: No  Constitutional Fever: No Night sweats?: No Weight loss?: No Fatigue?: Yes  Skin Skin rash/lesions?: No Itching?: Yes  Eyes Blurred vision?: No Double vision?: No  Ears/Nose/Throat Sore throat?: No Sinus problems?: No  Hematologic/Lymphatic Swollen glands?: No Easy bruising?: Yes  Cardiovascular Leg swelling?: No Chest pain?: No  Respiratory Cough?: No Shortness of breath?: No  Endocrine Excessive thirst?: No  Musculoskeletal Back pain?: No Joint pain?: No  Neurological Headaches?: No Dizziness?: No  Psychologic Depression?: No Anxiety?: No  Physical Exam: BP (!) 115/59   Pulse 89   Ht 5' 4.5" (1.638 m)   Wt 167 lb (75.8 kg)   BMI 28.22 kg/m   Constitutional:  Alert and oriented, No acute distress. HEENT: [place] AT, moist mucus membranes.  Trachea midline, no masses. Cardiovascular: No clubbing, cyanosis, or  edema. Respiratory: Normal respiratory effort, no increased work of breathing.  Skin: No rashes, bruises or suspicious lesions.  Alopecia appreciated. Neurologic: Grossly intact, no focal deficits, moving all 4 extremities. Psychiatric: Normal mood and affect.  Laboratory Data: Lab Results  Component Value Date   WBC 8.9 01/08/2017   HGB 10.5 (L) 01/08/2017   HCT 31.5 (L) 01/08/2017   MCV 95.1 01/08/2017   PLT 220 01/08/2017    Cr 1.52 on 01/30/17  Urinalysis N/a  Pertinent Imaging: CLINICAL DATA:  Follow up left renal mass. History of right breast cancer with chemotherapy and surgery in 1995.  EXAM: CT ABDOMEN WITHOUT AND WITH CONTRAST  TECHNIQUE: Multidetector CT imaging of the abdomen was performed following the standard protocol before and following the bolus administration of intravenous contrast.  CONTRAST:  100 ml Isovue 370.  COMPARISON:  CT 11/11/2012 and 09/27/2016.  FINDINGS:  Lower chest: Cardiomegaly and atherosclerosis of the aorta and coronary arteries again noted. There is no pleural effusion. There is mild subpleural reticulation at both lung bases.  Hepatobiliary: Stable morphologic changes of cirrhosis with contour irregularity of the liver and relative enlargement of the caudate and left lobes. No focal lesions or abnormal enhancement are seen following contrast. No significant biliary dilatation status post cholecystectomy.  Pancreas: Unremarkable. No pancreatic ductal dilatation or surrounding inflammatory changes.  Spleen: Stable mild splenomegaly. The spleen measures 13.6 x 6.3 x 12.6 cm (volume = 570 cm^3). No focal lesions are identified.  Adrenals/Urinary Tract: Both adrenal glands appear normal. Pre contrast images demonstrate no urinary tract calculi or hydronephrosis. The right kidney appears normal. Thick walled exophytic lesion involving the upper interpolar region of the left kidney measures 19 x 18 mm transverse on  image 60 of series 3 and 20 mm in height on coronal image 70 of series 604. This has slightly enlarged compared with the recent prior study. Centrally, this measures near water density. Enhancing wall measures up to 4 mm in thickness.  Stomach/Bowel: No evidence of bowel wall thickening, distention or surrounding inflammatory change.Rectum not imaged.  Vascular/Lymphatic: There are no enlarged abdominal lymph nodes. There is aortic and branch vessel atherosclerosis. The renal veins and IVC appear normal. Multiple venous collaterals are again noted with a recanalized left paraumbilical vein.  Other: Previously demonstrated ascites has resolved. No peritoneal nodularity.  Musculoskeletal: No acute or significant osseous findings. Prominent degenerative disc disease again noted at L2-3.  IMPRESSION: 1. The left renal lesion of concern has mildly enlarged and evolved with increased central lucency, although a thick enhancing wall. Major differential considerations are cystic renal neoplasm versus complex cyst. Bosniak 3. Recommend continued short-term follow-up if tissue sampling not performed. 2. Stable changes of cirrhosis and portal hypertension with mild splenomegaly and multiple venous collaterals. Ascites has resolved. 3.  Aortic Atherosclerosis (ICD10-I70.0).   Electronically Signed   By: Richardean Sale M.D.   On: 01/30/2017 17:21   CT scan was personally reviewed today with the patient.   This is compared to CT on 09/2216.  Assessment & Plan:    1. Left renal mass Mildly enlarging 19 mm left renal mass most consistent with Bosniak 3 lesion. She understands of this has an approximately 50-75% of representing a small malignancy. Has had some interval mild enlargement over the past 4 months, 1-2 mm.  Given the proximity to the spleen, if not easily amenable to percutaneous intervention. Additionally, she has multiple medical problems.  Options including  surveillance, percutaneous intervention of present nephrectomy were all discussed. This time, she would like to proceed with surveillance which seems most reasonable. I recommend a follow-up in 6 months with renal ultrasound. She is agreeable to this plan.  She understands that extreme importance of reliable and consistent follow-up. All questions are answered.    Return in about 6 months (around 08/04/2017).  Hollice Espy, MD  [facility] [place],  [id number] [phone]

## 2017-02-20 DIAGNOSIS — L853 Xerosis cutis: Secondary | ICD-10-CM | POA: Diagnosis not present

## 2017-02-20 DIAGNOSIS — L570 Actinic keratosis: Secondary | ICD-10-CM | POA: Diagnosis not present

## 2017-02-21 DIAGNOSIS — H524 Presbyopia: Secondary | ICD-10-CM | POA: Diagnosis not present

## 2017-03-07 DIAGNOSIS — E119 Type 2 diabetes mellitus without complications: Secondary | ICD-10-CM | POA: Diagnosis not present

## 2017-03-07 DIAGNOSIS — H469 Unspecified optic neuritis: Secondary | ICD-10-CM | POA: Diagnosis not present

## 2017-03-07 DIAGNOSIS — H401123 Primary open-angle glaucoma, left eye, severe stage: Secondary | ICD-10-CM | POA: Diagnosis not present

## 2017-03-07 DIAGNOSIS — H401121 Primary open-angle glaucoma, left eye, mild stage: Secondary | ICD-10-CM | POA: Diagnosis not present

## 2017-03-15 ENCOUNTER — Other Ambulatory Visit: Payer: Self-pay | Admitting: Ophthalmology

## 2017-03-15 DIAGNOSIS — H469 Unspecified optic neuritis: Secondary | ICD-10-CM

## 2017-03-15 DIAGNOSIS — H468 Other optic neuritis: Secondary | ICD-10-CM

## 2017-03-18 DIAGNOSIS — Z794 Long term (current) use of insulin: Secondary | ICD-10-CM | POA: Diagnosis not present

## 2017-03-18 DIAGNOSIS — E782 Mixed hyperlipidemia: Secondary | ICD-10-CM | POA: Diagnosis not present

## 2017-03-18 DIAGNOSIS — I10 Essential (primary) hypertension: Secondary | ICD-10-CM | POA: Diagnosis not present

## 2017-03-18 DIAGNOSIS — D508 Other iron deficiency anemias: Secondary | ICD-10-CM | POA: Diagnosis not present

## 2017-03-18 DIAGNOSIS — E1142 Type 2 diabetes mellitus with diabetic polyneuropathy: Secondary | ICD-10-CM | POA: Diagnosis not present

## 2017-04-01 DIAGNOSIS — N390 Urinary tract infection, site not specified: Secondary | ICD-10-CM | POA: Diagnosis not present

## 2017-04-01 DIAGNOSIS — R7989 Other specified abnormal findings of blood chemistry: Secondary | ICD-10-CM | POA: Diagnosis not present

## 2017-04-22 DIAGNOSIS — Z8619 Personal history of other infectious and parasitic diseases: Secondary | ICD-10-CM | POA: Diagnosis not present

## 2017-04-22 DIAGNOSIS — D509 Iron deficiency anemia, unspecified: Secondary | ICD-10-CM | POA: Diagnosis not present

## 2017-04-22 DIAGNOSIS — K7460 Unspecified cirrhosis of liver: Secondary | ICD-10-CM | POA: Diagnosis not present

## 2017-04-24 DIAGNOSIS — H401121 Primary open-angle glaucoma, left eye, mild stage: Secondary | ICD-10-CM | POA: Diagnosis not present

## 2017-04-24 DIAGNOSIS — H25812 Combined forms of age-related cataract, left eye: Secondary | ICD-10-CM | POA: Diagnosis not present

## 2017-05-09 DIAGNOSIS — H25811 Combined forms of age-related cataract, right eye: Secondary | ICD-10-CM | POA: Diagnosis not present

## 2017-05-09 DIAGNOSIS — H2511 Age-related nuclear cataract, right eye: Secondary | ICD-10-CM | POA: Diagnosis not present

## 2017-05-15 ENCOUNTER — Inpatient Hospital Stay: Payer: Medicare HMO | Attending: Oncology

## 2017-05-15 DIAGNOSIS — D508 Other iron deficiency anemias: Secondary | ICD-10-CM

## 2017-05-15 DIAGNOSIS — E785 Hyperlipidemia, unspecified: Secondary | ICD-10-CM | POA: Insufficient documentation

## 2017-05-15 DIAGNOSIS — Z794 Long term (current) use of insulin: Secondary | ICD-10-CM | POA: Diagnosis not present

## 2017-05-15 DIAGNOSIS — H409 Unspecified glaucoma: Secondary | ICD-10-CM | POA: Insufficient documentation

## 2017-05-15 DIAGNOSIS — Z9011 Acquired absence of right breast and nipple: Secondary | ICD-10-CM | POA: Diagnosis not present

## 2017-05-15 DIAGNOSIS — M069 Rheumatoid arthritis, unspecified: Secondary | ICD-10-CM | POA: Diagnosis not present

## 2017-05-15 DIAGNOSIS — Z7982 Long term (current) use of aspirin: Secondary | ICD-10-CM | POA: Diagnosis not present

## 2017-05-15 DIAGNOSIS — Z79899 Other long term (current) drug therapy: Secondary | ICD-10-CM | POA: Insufficient documentation

## 2017-05-15 DIAGNOSIS — Z87891 Personal history of nicotine dependence: Secondary | ICD-10-CM | POA: Insufficient documentation

## 2017-05-15 DIAGNOSIS — Z853 Personal history of malignant neoplasm of breast: Secondary | ICD-10-CM | POA: Diagnosis not present

## 2017-05-15 DIAGNOSIS — E1140 Type 2 diabetes mellitus with diabetic neuropathy, unspecified: Secondary | ICD-10-CM | POA: Insufficient documentation

## 2017-05-15 DIAGNOSIS — D509 Iron deficiency anemia, unspecified: Secondary | ICD-10-CM | POA: Insufficient documentation

## 2017-05-15 DIAGNOSIS — I10 Essential (primary) hypertension: Secondary | ICD-10-CM | POA: Diagnosis not present

## 2017-05-15 DIAGNOSIS — N2889 Other specified disorders of kidney and ureter: Secondary | ICD-10-CM | POA: Insufficient documentation

## 2017-05-15 LAB — IRON AND TIBC
IRON: 47 ug/dL (ref 28–170)
SATURATION RATIOS: 11 % (ref 10.4–31.8)
TIBC: 442 ug/dL (ref 250–450)
UIBC: 395 ug/dL

## 2017-05-15 LAB — CBC WITH DIFFERENTIAL/PLATELET
BASOS ABS: 0.1 10*3/uL (ref 0–0.1)
BASOS PCT: 1 %
EOS ABS: 2.1 10*3/uL — AB (ref 0–0.7)
EOS PCT: 26 %
HCT: 32.7 % — ABNORMAL LOW (ref 35.0–47.0)
HEMOGLOBIN: 11 g/dL — AB (ref 12.0–16.0)
Lymphocytes Relative: 17 %
Lymphs Abs: 1.3 10*3/uL (ref 1.0–3.6)
MCH: 33.8 pg (ref 26.0–34.0)
MCHC: 33.6 g/dL (ref 32.0–36.0)
MCV: 100.8 fL — ABNORMAL HIGH (ref 80.0–100.0)
Monocytes Absolute: 0.5 10*3/uL (ref 0.2–0.9)
Monocytes Relative: 7 %
NEUTROS PCT: 49 %
Neutro Abs: 3.9 10*3/uL (ref 1.4–6.5)
PLATELETS: 196 10*3/uL (ref 150–440)
RBC: 3.24 MIL/uL — ABNORMAL LOW (ref 3.80–5.20)
RDW: 14.8 % — ABNORMAL HIGH (ref 11.5–14.5)
WBC: 8 10*3/uL (ref 3.6–11.0)

## 2017-05-15 LAB — FERRITIN: Ferritin: 26 ng/mL (ref 11–307)

## 2017-05-15 NOTE — Progress Notes (Signed)
[place]  Telephone:(336) [phone] Fax:(336) [phone]  ID: Emily Faulkner OB: Aug 17, 1940  MR#: [id number]  LNL#:[id number]  Patient Care Team: Sofie Hartigan, MD as PCP - General (Family Medicine)  CHIEF COMPLAINT: Iron deficiency anemia.  INTERVAL HISTORY: Patient returns to clinic today for repeat laboratory work and further evaluation. She does not complain of weakness or fatigue today. She currently feels well and is asymptomatic. She denies any pain. She denies any shortness of breath, chest pain, cough, or hemoptysis. She denies any nausea, vomiting, diarrhea, or constipation. She has no melena or hematochezia. She has no urinary complaints. Patient offers no specific complaints today.  REVIEW OF SYSTEMS:   Review of Systems  Constitutional: Negative for fever, malaise/fatigue and weight loss.  HENT: Negative for congestion.   Respiratory: Negative.  Negative for cough, hemoptysis and shortness of breath.   Cardiovascular: Negative.  Negative for chest pain and leg swelling.  Gastrointestinal: Negative for abdominal pain, blood in stool, constipation, diarrhea, nausea and vomiting.  Genitourinary: Negative.  Negative for dysuria, frequency and urgency.  Musculoskeletal: Negative.   Skin: Negative.  Negative for rash.  Neurological: Positive for tremors. Negative for tingling, weakness and headaches.  Psychiatric/Behavioral: Negative.  Negative for depression. The patient is not nervous/anxious.    As per HPI. Otherwise, a complete review of systems is negative.  PAST MEDICAL HISTORY: Past Medical History:  Diagnosis Date  . Cancer [facility])    breast cancer right mastectomy  . Diabetes mellitus without complication ([place])   . Glaucoma   . Hyperlipidemia   . Hypertension   . Neuropathy   . Osteoarthritis   . Rheumatoid arthritis ([place])   . Telangiectasias     PAST SURGICAL HISTORY: Past Surgical History:  Procedure Laterality Date  . BREAST  SURGERY     right mastectomy  . CHOLECYSTECTOMY    . COLONOSCOPY    . EXCISIONAL HEMORRHOIDECTOMY      FAMILY HISTORY: Reviewed and unchanged. No reported history of malignancy or chronic disease.  ADVANCED DIRECTIVES (Y/N):  N  HEALTH MAINTENANCE: Social History   Tobacco Use  . Smoking status: Former Smoker    Last attempt to quit: 07/10/2011    Years since quitting: 5.8  . Smokeless tobacco: Never Used  Substance Use Topics  . Alcohol use: No  . Drug use: No     Colonoscopy:  PAP:  Bone density:  Lipid panel:  No Known Allergies  Current Outpatient Medications  Medication Sig Dispense Refill  . albuterol (PROVENTIL HFA;VENTOLIN HFA) 108 (90 Base) MCG/ACT inhaler Inhale into the lungs.    Marland Kitchen ammonium lactate (LAC-HYDRIN) 12 % lotion Apply topically.    Marland Kitchen aspirin 81 MG EC tablet Take 81 mg by mouth daily. Swallow whole.    . bismuth-metronidazole-tetracycline (PYLERA) 140-125-125 MG capsule Take 3 capsules by mouth 4 (four) times daily -  before meals and at bedtime.    . DOCOSAHEXAENOIC ACID PO Take 1,200 mg by mouth.    . Ergocalciferol (VITAMIN D2) 2000 units TABS Take 1 tablet by mouth daily.    . ferrous fumarate (HEMOCYTE - 106 MG FE) 325 (106 Fe) MG TABS tablet Take 1 tablet by mouth daily.    Marland Kitchen gabapentin (NEURONTIN) 300 MG capsule Take 600 mg by mouth 2 (two) times daily.     Marland Kitchen glipiZIDE (GLUCOTROL) 10 MG tablet Take 10 mg by mouth daily before breakfast.    . Insulin Glargine (LANTUS SOLOSTAR) 100 UNIT/ML Solostar Pen Inject 20 Units  into the skin.     Marland Kitchen lisinopril (PRINIVIL,ZESTRIL) 2.5 MG tablet Take by mouth.    . loratadine (CLARITIN) 10 MG tablet Take 10 mg by mouth daily.    . magnesium oxide (MAG-OX) 400 MG tablet Take 800 mg by mouth.    . metFORMIN (GLUCOPHAGE) 850 MG tablet Take 400 mg by mouth 3 (three) times daily.     . niacin 500 MG CR capsule Take 500 mg by mouth at bedtime.    . Omega-3 Fatty Acids (FISH OIL PO) Take 1 tablet by mouth.    .  RABEprazole (ACIPHEX) 20 MG tablet Take by mouth.    . sitaGLIPtin (JANUVIA) 50 MG tablet Take 50 mg by mouth daily.    . vitamin B-12 (CYANOCOBALAMIN) 1000 MCG tablet Take 1,000 mcg by mouth daily.    . furosemide (LASIX) 40 MG tablet Take 40 mg by mouth.     No current facility-administered medications for this visit.     OBJECTIVE: Vitals:   05/17/17 1007  BP: (!) 143/74  Pulse: 84  Resp: 20  Temp: (!) 97.4 F (36.3 C)     Body mass index is 28.58 kg/m.    ECOG FS:1 - Symptomatic but completely ambulatory  General: Well-developed, well-nourished, no acute distress. Eyes: Pink conjunctiva, anicteric sclera. Lungs: Clear to auscultation bilaterally. Heart: Regular rate and rhythm. No rubs, murmurs, or gallops. Abdomen: Soft, nontender, nondistended. No organomegaly noted, normoactive bowel sounds. Musculoskeletal: No edema, cyanosis, or clubbing. Neuro: Alert, answering all questions appropriately. Cranial nerves grossly intact. Skin: No rashes or petechiae noted. Psych: Normal affect.   LAB RESULTS:  Lab Results  Component Value Date   BUN 24 (H) 01/30/2017   CREATININE 1.52 (H) 01/30/2017   GFRNONAA 32 (L) 01/30/2017   GFRAA 38 (L) 01/30/2017    Lab Results  Component Value Date   WBC 8.0 05/15/2017   NEUTROABS 3.9 05/15/2017   HGB 11.0 (L) 05/15/2017   HCT 32.7 (L) 05/15/2017   MCV 100.8 (H) 05/15/2017   PLT 196 05/15/2017   Lab Results  Component Value Date   IRON 47 05/15/2017   TIBC 442 05/15/2017   IRONPCTSAT 11 05/15/2017   Lab Results  Component Value Date   FERRITIN 26 05/15/2017    STUDIES: No results found.  ASSESSMENT: Iron deficiency anemia.  PLAN:    1. Iron deficiency anemia: Patient's hemoglobin is decreased, but continues to improve. Her iron stores continue to be within normal limits. Previously, the remainder of her blood work was either negative or within normal limits. No intervention is needed at this time. Patient last  received IV iron on April 26, 2016.  After lengthy discussion with the patient, it was agreed upon that since she has not required IV iron in over a 1 year she can be discharged from clinic with close follow-up with her primary care physician.  No follow-up has been scheduled at this time.  Please refer patient back if there are any questions or concerns.   2. Masslike thickening and distal rectum: This is seen on CT scan, but colonoscopy was negative. 3. Left kidney mass: Continue follow-up with urology as indicated. 4. Hypertension: Patient's blood pressure only mildly elevated today.  Continue current medications as prescribed.   Patient expressed understanding and was in agreement with this plan. She also understands that She can call clinic at any time with any questions, concerns, or complaints.    Lloyd Huger, MD 05/17/17 1:52 PM

## 2017-05-17 ENCOUNTER — Inpatient Hospital Stay (HOSPITAL_BASED_OUTPATIENT_CLINIC_OR_DEPARTMENT_OTHER): Payer: Medicare HMO | Admitting: Oncology

## 2017-05-17 ENCOUNTER — Other Ambulatory Visit: Payer: Self-pay

## 2017-05-17 ENCOUNTER — Inpatient Hospital Stay: Payer: Medicare HMO

## 2017-05-17 ENCOUNTER — Encounter: Payer: Self-pay | Admitting: Oncology

## 2017-05-17 VITALS — BP 143/74 | HR 84 | Temp 97.4°F | Resp 20 | Wt 169.1 lb

## 2017-05-17 DIAGNOSIS — D509 Iron deficiency anemia, unspecified: Secondary | ICD-10-CM | POA: Diagnosis not present

## 2017-05-17 DIAGNOSIS — N2889 Other specified disorders of kidney and ureter: Secondary | ICD-10-CM

## 2017-05-17 DIAGNOSIS — H409 Unspecified glaucoma: Secondary | ICD-10-CM | POA: Diagnosis not present

## 2017-05-17 DIAGNOSIS — Z79899 Other long term (current) drug therapy: Secondary | ICD-10-CM | POA: Diagnosis not present

## 2017-05-17 DIAGNOSIS — E1140 Type 2 diabetes mellitus with diabetic neuropathy, unspecified: Secondary | ICD-10-CM | POA: Diagnosis not present

## 2017-05-17 DIAGNOSIS — E785 Hyperlipidemia, unspecified: Secondary | ICD-10-CM | POA: Diagnosis not present

## 2017-05-17 DIAGNOSIS — Z9011 Acquired absence of right breast and nipple: Secondary | ICD-10-CM | POA: Diagnosis not present

## 2017-05-17 DIAGNOSIS — Z853 Personal history of malignant neoplasm of breast: Secondary | ICD-10-CM | POA: Diagnosis not present

## 2017-05-17 DIAGNOSIS — I10 Essential (primary) hypertension: Secondary | ICD-10-CM | POA: Diagnosis not present

## 2017-05-17 DIAGNOSIS — D508 Other iron deficiency anemias: Secondary | ICD-10-CM

## 2017-05-17 NOTE — Progress Notes (Signed)
Patient denies any concerns today.  

## 2017-05-27 DIAGNOSIS — Z794 Long term (current) use of insulin: Secondary | ICD-10-CM | POA: Diagnosis not present

## 2017-05-27 DIAGNOSIS — E785 Hyperlipidemia, unspecified: Secondary | ICD-10-CM | POA: Diagnosis not present

## 2017-05-27 DIAGNOSIS — E1159 Type 2 diabetes mellitus with other circulatory complications: Secondary | ICD-10-CM | POA: Diagnosis not present

## 2017-05-27 DIAGNOSIS — I10 Essential (primary) hypertension: Secondary | ICD-10-CM | POA: Diagnosis not present

## 2017-05-27 DIAGNOSIS — E1142 Type 2 diabetes mellitus with diabetic polyneuropathy: Secondary | ICD-10-CM | POA: Diagnosis not present

## 2017-05-27 DIAGNOSIS — E1169 Type 2 diabetes mellitus with other specified complication: Secondary | ICD-10-CM | POA: Diagnosis not present

## 2017-05-28 DIAGNOSIS — E1169 Type 2 diabetes mellitus with other specified complication: Secondary | ICD-10-CM | POA: Diagnosis not present

## 2017-05-28 DIAGNOSIS — E785 Hyperlipidemia, unspecified: Secondary | ICD-10-CM | POA: Diagnosis not present

## 2017-05-28 DIAGNOSIS — E1142 Type 2 diabetes mellitus with diabetic polyneuropathy: Secondary | ICD-10-CM | POA: Diagnosis not present

## 2017-05-28 DIAGNOSIS — E1159 Type 2 diabetes mellitus with other circulatory complications: Secondary | ICD-10-CM | POA: Diagnosis not present

## 2017-05-28 DIAGNOSIS — I10 Essential (primary) hypertension: Secondary | ICD-10-CM | POA: Diagnosis not present

## 2017-05-28 DIAGNOSIS — L738 Other specified follicular disorders: Secondary | ICD-10-CM | POA: Diagnosis not present

## 2017-05-28 DIAGNOSIS — L570 Actinic keratosis: Secondary | ICD-10-CM | POA: Diagnosis not present

## 2017-05-28 DIAGNOSIS — Z794 Long term (current) use of insulin: Secondary | ICD-10-CM | POA: Diagnosis not present

## 2017-05-28 DIAGNOSIS — L853 Xerosis cutis: Secondary | ICD-10-CM | POA: Diagnosis not present

## 2017-07-04 ENCOUNTER — Other Ambulatory Visit: Payer: Self-pay | Admitting: Gastroenterology

## 2017-07-04 DIAGNOSIS — K7460 Unspecified cirrhosis of liver: Secondary | ICD-10-CM

## 2017-07-08 DIAGNOSIS — I10 Essential (primary) hypertension: Secondary | ICD-10-CM | POA: Diagnosis not present

## 2017-07-08 DIAGNOSIS — E782 Mixed hyperlipidemia: Secondary | ICD-10-CM | POA: Diagnosis not present

## 2017-07-08 DIAGNOSIS — E1165 Type 2 diabetes mellitus with hyperglycemia: Secondary | ICD-10-CM | POA: Diagnosis not present

## 2017-07-08 DIAGNOSIS — N183 Chronic kidney disease, stage 3 (moderate): Secondary | ICD-10-CM | POA: Diagnosis not present

## 2017-07-08 DIAGNOSIS — Z0000 Encounter for general adult medical examination without abnormal findings: Secondary | ICD-10-CM | POA: Diagnosis not present

## 2017-07-08 DIAGNOSIS — Z794 Long term (current) use of insulin: Secondary | ICD-10-CM | POA: Diagnosis not present

## 2017-07-08 DIAGNOSIS — D508 Other iron deficiency anemias: Secondary | ICD-10-CM | POA: Diagnosis not present

## 2017-07-08 DIAGNOSIS — E1140 Type 2 diabetes mellitus with diabetic neuropathy, unspecified: Secondary | ICD-10-CM | POA: Diagnosis not present

## 2017-07-10 DIAGNOSIS — D508 Other iron deficiency anemias: Secondary | ICD-10-CM | POA: Diagnosis not present

## 2017-07-10 DIAGNOSIS — E782 Mixed hyperlipidemia: Secondary | ICD-10-CM | POA: Diagnosis not present

## 2017-07-12 DIAGNOSIS — Z794 Long term (current) use of insulin: Secondary | ICD-10-CM | POA: Diagnosis not present

## 2017-07-12 DIAGNOSIS — E1140 Type 2 diabetes mellitus with diabetic neuropathy, unspecified: Secondary | ICD-10-CM | POA: Diagnosis not present

## 2017-07-12 DIAGNOSIS — E1142 Type 2 diabetes mellitus with diabetic polyneuropathy: Secondary | ICD-10-CM | POA: Diagnosis not present

## 2017-07-12 DIAGNOSIS — I10 Essential (primary) hypertension: Secondary | ICD-10-CM | POA: Diagnosis not present

## 2017-07-12 DIAGNOSIS — E1169 Type 2 diabetes mellitus with other specified complication: Secondary | ICD-10-CM | POA: Diagnosis not present

## 2017-07-12 DIAGNOSIS — E785 Hyperlipidemia, unspecified: Secondary | ICD-10-CM | POA: Diagnosis not present

## 2017-07-12 DIAGNOSIS — E1159 Type 2 diabetes mellitus with other circulatory complications: Secondary | ICD-10-CM | POA: Diagnosis not present

## 2017-07-23 ENCOUNTER — Other Ambulatory Visit: Payer: Self-pay | Admitting: Urology

## 2017-07-23 DIAGNOSIS — N3289 Other specified disorders of bladder: Secondary | ICD-10-CM

## 2017-07-23 DIAGNOSIS — N2889 Other specified disorders of kidney and ureter: Secondary | ICD-10-CM

## 2017-07-30 ENCOUNTER — Ambulatory Visit: Admission: RE | Admit: 2017-07-30 | Payer: Medicare HMO | Source: Ambulatory Visit

## 2017-07-30 ENCOUNTER — Ambulatory Visit
Admission: RE | Admit: 2017-07-30 | Discharge: 2017-07-30 | Disposition: A | Discharge status: Home / Self Care | Payer: Medicare HMO | Source: Ambulatory Visit | Attending: Gastroenterology | Admitting: Gastroenterology

## 2017-07-30 DIAGNOSIS — R161 Splenomegaly, not elsewhere classified: Secondary | ICD-10-CM | POA: Insufficient documentation

## 2017-07-30 DIAGNOSIS — N281 Cyst of kidney, acquired: Secondary | ICD-10-CM | POA: Diagnosis not present

## 2017-07-30 DIAGNOSIS — N3289 Other specified disorders of bladder: Secondary | ICD-10-CM | POA: Diagnosis not present

## 2017-07-30 DIAGNOSIS — N2889 Other specified disorders of kidney and ureter: Secondary | ICD-10-CM

## 2017-07-30 DIAGNOSIS — K7460 Unspecified cirrhosis of liver: Secondary | ICD-10-CM | POA: Diagnosis not present

## 2017-08-01 ENCOUNTER — Ambulatory Visit: Payer: Medicare HMO

## 2017-08-02 ENCOUNTER — Ambulatory Visit: Payer: Medicare HMO | Admitting: Urology

## 2017-08-09 ENCOUNTER — Encounter: Payer: Self-pay | Admitting: Urology

## 2017-08-09 ENCOUNTER — Ambulatory Visit (INDEPENDENT_AMBULATORY_CARE_PROVIDER_SITE_OTHER): Payer: Medicare HMO | Admitting: Urology

## 2017-08-09 VITALS — BP 139/59 | HR 100 | Ht 64.0 in | Wt 175.0 lb

## 2017-08-09 DIAGNOSIS — N2889 Other specified disorders of kidney and ureter: Secondary | ICD-10-CM | POA: Diagnosis not present

## 2017-08-09 NOTE — Progress Notes (Signed)
08/09/2017 5:25 PM   Emily Faulkner 04/28/1941 [id number]  Referring provider: Sofie Hartigan, MD [place], [place] [address]  Chief Complaint  Patient presents with  . Renal Mass    16month   HPI: 77yo F with incidental renal mass returns today for 684-monthnterval imaging with renal ultrasound.    She was initially seen in 10/2016 at which time a 1.7 x 1.6 cm enhancing interpolar left renal mass was identified.  She underwent follow-up CT abdomen with and without contrast on 01/30/2017 which showed the lesion had enlarged slightly to 1.9 x 1.8 cm which is most consistent with a Bosniak 3 lesion, primarily cystic with thick enhancing  wall.  Most recent imaging on 07/30/2017 in the form of renal ultrasound reveals a 1.3 x 1.2 x 1.4 complex exophytic midpole partially cystic structure on the kidney.  This was not appreciated on previous scan on 11/2012.  No dysuria or gross hematuria.    Her son was dx with kidney cancer s/p nephrectomy.  Remote history of breast cancer 1995 s/p mastectomy, chemo and tamoxifen therapy x 5 years, NED.     PMH: Past Medical History:  Diagnosis Date  . [facility]   breast cancer right mastectomy  . Diabetes mellitus without complication ([facility] [place]  . Glaucoma   . Hyperlipidemia   . Hypertension   . Neuropathy   . Osteoarthritis   . [place]oid arthritis ([place]  . Telangiectasias     Surgical History: Past Surgical History:  Procedure Laterality Date  . BREAST SURGERY     right mastectomy  . CHOLECYSTECTOMY    . COLONOSCOPY    . COLONOSCOPY WITH PROPOFOL N/A 08/14/2016   Procedure: COLONOSCOPY WITH PROPOFOL;  Surgeon: MaLollie SailsMD;  [facility];  Service: Endoscopy;  Laterality: N/A;  . ESOPHAGOGASTRODUODENOSCOPY (EGD) WITH PROPOFOL N/A 08/14/2016   Procedure: ESOPHAGOGASTRODUODENOSCOPY (EGD) WITH PROPOFOL;  Surgeon: MaLollie SailsMD;  [facility];  Service: Endoscopy;  Laterality:  N/A;  . EXCISIONAL HEMORRHOIDECTOMY      Home Medications:  Allergies as of 08/09/2017   No Known Allergies     Medication List        Accurate as of 08/09/17  5:25 PM. Always use your most recent med list.          albuterol 108 (90 Base) MCG/ACT inhaler Commonly known as:  PROVENTIL HFA;VENTOLIN HFA Inhale into the lungs.   ammonium lactate 12 % lotion Commonly known as:  LAC-HYDRIN Apply topically.   aspirin 81 MG EC tablet Take 81 mg by mouth daily. Swallow whole.   DOCOSAHEXAENOIC ACID PO Take 1,200 mg by mouth.   ferrous fumarate 325 (106 Fe) MG Tabs tablet Commonly known as:  HEMOCYTE - 106 mg FE Take 1 tablet by mouth daily.   FISH OIL PO Take 1 tablet by mouth.   furosemide 40 MG tablet Commonly known as:  LASIX Take 40 mg by mouth.   gabapentin 300 MG capsule Commonly known as:  NEURONTIN Take 600 mg by mouth 2 (two) times daily.   glipiZIDE 10 MG tablet Commonly known as:  GLUCOTROL Take 10 mg by mouth daily before breakfast.   LANTUS SOLOSTAR 100 UNIT/ML Solostar Pen Generic drug:  Insulin Glargine Inject 20 Units into the skin.   lisinopril 2.5 MG tablet Commonly known as:  PRINIVIL,ZESTRIL Take by mouth.   loratadine 10 MG tablet Commonly known as:  CLARITIN Take 10 mg  by mouth daily.   magnesium oxide 400 MG tablet Commonly known as:  MAG-OX Take 800 mg by mouth.   metFORMIN 850 MG tablet Commonly known as:  GLUCOPHAGE Take 400 mg by mouth 3 (three) times daily.   niacin 500 MG CR capsule Take 500 mg by mouth at bedtime.   PYLERA 140-125-125 MG capsule Generic drug:  bismuth-metronidazole-tetracycline Take 3 capsules by mouth 4 (four) times daily -  before meals and at bedtime.   RABEprazole 20 MG tablet Commonly known as:  ACIPHEX Take by mouth.   sitaGLIPtin 50 MG tablet Commonly known as:  JANUVIA Take 50 mg by mouth daily.   vitamin B-12 1000 MCG tablet Commonly known as:  CYANOCOBALAMIN Take 1,000 mcg by mouth  daily.   Vitamin D2 2000 units Tabs Take 1 tablet by mouth daily.       Allergies: No Known Allergies  Family History: Family History  Problem Relation Age of Onset  . Kidney cancer Son   . Bladder Cancer Neg Hx     Social History:  reports that she quit smoking about 6 years ago. she has never used smokeless tobacco. She reports that she does not drink alcohol or use drugs.  ROS: UROLOGY Frequent Urination?: No Hard to postpone urination?: No Burning/pain with urination?: No Get up at night to urinate?: No Leakage of urine?: No Urine stream starts and stops?: No Trouble starting stream?: No Do you have to strain to urinate?: No Blood in urine?: No Urinary tract infection?: No Sexually transmitted disease?: No Injury to kidneys or bladder?: No Painful intercourse?: No Weak stream?: No Currently pregnant?: No Vaginal bleeding?: No Last menstrual period?: n  Gastrointestinal Nausea?: No Vomiting?: No Indigestion/heartburn?: Yes Diarrhea?: Yes Constipation?: No  Constitutional Fever: No Night sweats?: No Weight loss?: No Fatigue?: No  Skin Skin rash/lesions?: No Itching?: Yes  Eyes Blurred vision?: No Double vision?: No  Ears/Nose/Throat Sore throat?: No Sinus problems?: No  Hematologic/Lymphatic Swollen glands?: No Easy bruising?: No  Cardiovascular Leg swelling?: No Chest pain?: No  Respiratory Cough?: No Shortness of breath?: No  Endocrine Excessive thirst?: No  Musculoskeletal Back pain?: No Joint pain?: No  Neurological Headaches?: No Dizziness?: No  Psychologic Depression?: No Anxiety?: No  Physical Exam: BP (!) 139/59   Pulse 100   Ht 5' 4"  (1.626 m)   Wt 175 lb (79.4 kg)   BMI 30.04 kg/m   Constitutional:  Alert and oriented, No acute distress. HEENT: [place] AT, moist mucus membranes.  Trachea midline, no masses. Cardiovascular: No clubbing, cyanosis, or edema. Respiratory: Normal respiratory effort, no increased  work of breathing.  Skin: No rashes, bruises or suspicious lesions.  Alopecia appreciated. Neurologic: Grossly intact, no focal deficits, moving all 4 extremities. Psychiatric: Normal mood and affect.  Laboratory Data: Lab Results  Component Value Date   WBC 8.0 05/15/2017   HGB 11.0 (L) 05/15/2017   HCT 32.7 (L) 05/15/2017   MCV 100.8 (H) 05/15/2017   PLT 196 05/15/2017    Cr 1.52 on 01/30/17  Urinalysis N/a  Pertinent Imaging: ICAL DATA:  Hepatic cirrhosis, previous cholecystectomy. History of a renal mass.  EXAM: ABDOMEN ULTRASOUND COMPLETE  COMPARISON:  Abdominal CT scan dated January 30, 2017.  FINDINGS: Gallbladder: The gallbladder surgically absent.  Common bile duct: Diameter: 6 mm  Liver: The hepatic echotexture is heterogeneously increased. There is no focal mass or ductal dilation. The surface contour of the liver is mildly irregular. Portal vein is patent on color Doppler imaging with  normal direction of blood flow towards the liver.  IVC: No abnormality visualized.  Pancreas: Visualized portion unremarkable.  Spleen: The spleen measures 13.8 x 15.4 x 5.6 cm and exhibits a calculated volume of 619 cc.  Right Kidney: Length: 9.6 cm. The cortex is thinned but the echotexture remains greater than that of the liver. There is no hydronephrosis. 11.0 cm  Left Kidney: Length: 11.0 cm. There is thinning of the renal cortex similar to that on the right.The cortical echotexture is similar to that on the right. There is a complex exophytic midpole partially cystic structure measuring 1.3 x 1.2 x 1.4 cm. This appears fairly stable.  Abdominal aorta: The aorta is largely obscured by bowel gas. The maximal diameter is observed proximally and is just under 3 cm in diameter.  No ascites is observed.  IMPRESSION: Increased hepatic echotexture consistent with cirrhosis. There is no focal mass. There is mild surface contour irregularity. There  is mild splenomegaly.  Stable appearance of the complex cystic appearing structure in the midpole cortex of the left kidney.   Electronically Signed   By: David  Martinique M.D.   On: 07/30/2017 13:33  Renal ultrasound was personally reviewed today.  This is compared to previous imaging including CT abdomen from 01/2017.  Size is relatively stable.  Assessment & Plan:    1. Left renal mass Stable left Bosniak 3 renal lesion, currently measuring 1.4 cm which measured 1.9 cm 6 months ago.  Suspect discrepancy in size is related to imaging modality rather than interval decrease in size.  Overall, the lesion appears relatively stable.  As such, given her multiple medical comorbidities and difficult location of the renal mass, I would recommend continued surveillance.  She is agreeable with this plan.  Plan for renal ultrasound again in 6 months.  If the renal lesion remains stable at this point, may spread out interval of surveillance.   Return in about 6 months (around 02/06/2018) for RUS.  Hollice Espy, MD  [facility], [place] [address] [phone]

## 2017-11-04 DIAGNOSIS — E785 Hyperlipidemia, unspecified: Secondary | ICD-10-CM | POA: Diagnosis not present

## 2017-11-04 DIAGNOSIS — E1169 Type 2 diabetes mellitus with other specified complication: Secondary | ICD-10-CM | POA: Diagnosis not present

## 2017-11-04 DIAGNOSIS — Z794 Long term (current) use of insulin: Secondary | ICD-10-CM | POA: Diagnosis not present

## 2017-11-04 DIAGNOSIS — I10 Essential (primary) hypertension: Secondary | ICD-10-CM | POA: Diagnosis not present

## 2017-11-04 DIAGNOSIS — E1159 Type 2 diabetes mellitus with other circulatory complications: Secondary | ICD-10-CM | POA: Diagnosis not present

## 2017-11-04 DIAGNOSIS — E1142 Type 2 diabetes mellitus with diabetic polyneuropathy: Secondary | ICD-10-CM | POA: Diagnosis not present

## 2017-11-27 DIAGNOSIS — L400 Psoriasis vulgaris: Secondary | ICD-10-CM | POA: Diagnosis not present

## 2017-11-27 DIAGNOSIS — L738 Other specified follicular disorders: Secondary | ICD-10-CM | POA: Diagnosis not present

## 2017-12-26 DIAGNOSIS — L400 Psoriasis vulgaris: Secondary | ICD-10-CM | POA: Diagnosis not present

## 2017-12-26 DIAGNOSIS — L280 Lichen simplex chronicus: Secondary | ICD-10-CM | POA: Diagnosis not present

## 2018-01-13 DIAGNOSIS — E119 Type 2 diabetes mellitus without complications: Secondary | ICD-10-CM | POA: Diagnosis not present

## 2018-01-29 ENCOUNTER — Encounter: Payer: Self-pay | Admitting: Emergency Medicine

## 2018-01-29 ENCOUNTER — Emergency Department: Payer: Medicare HMO

## 2018-01-29 ENCOUNTER — Other Ambulatory Visit: Payer: Self-pay

## 2018-01-29 ENCOUNTER — Inpatient Hospital Stay
Admission: EM | Admit: 2018-01-29 | Discharge: 2018-01-31 | DRG: 812 | Disposition: A | Discharge status: Home / Self Care | Payer: Medicare HMO | Source: Ambulatory Visit | Attending: Internal Medicine | Admitting: Internal Medicine

## 2018-01-29 DIAGNOSIS — K222 Esophageal obstruction: Secondary | ICD-10-CM | POA: Diagnosis present

## 2018-01-29 DIAGNOSIS — K3189 Other diseases of stomach and duodenum: Secondary | ICD-10-CM | POA: Diagnosis not present

## 2018-01-29 DIAGNOSIS — Z87891 Personal history of nicotine dependence: Secondary | ICD-10-CM | POA: Diagnosis not present

## 2018-01-29 DIAGNOSIS — R5383 Other fatigue: Secondary | ICD-10-CM | POA: Diagnosis not present

## 2018-01-29 DIAGNOSIS — H409 Unspecified glaucoma: Secondary | ICD-10-CM | POA: Diagnosis present

## 2018-01-29 DIAGNOSIS — Z8719 Personal history of other diseases of the digestive system: Secondary | ICD-10-CM | POA: Diagnosis not present

## 2018-01-29 DIAGNOSIS — Z9011 Acquired absence of right breast and nipple: Secondary | ICD-10-CM | POA: Diagnosis not present

## 2018-01-29 DIAGNOSIS — E1140 Type 2 diabetes mellitus with diabetic neuropathy, unspecified: Secondary | ICD-10-CM | POA: Diagnosis present

## 2018-01-29 DIAGNOSIS — E782 Mixed hyperlipidemia: Secondary | ICD-10-CM | POA: Diagnosis not present

## 2018-01-29 DIAGNOSIS — K7460 Unspecified cirrhosis of liver: Secondary | ICD-10-CM | POA: Diagnosis not present

## 2018-01-29 DIAGNOSIS — K922 Gastrointestinal hemorrhage, unspecified: Secondary | ICD-10-CM

## 2018-01-29 DIAGNOSIS — D649 Anemia, unspecified: Secondary | ICD-10-CM | POA: Diagnosis present

## 2018-01-29 DIAGNOSIS — R188 Other ascites: Secondary | ICD-10-CM | POA: Diagnosis not present

## 2018-01-29 DIAGNOSIS — D500 Iron deficiency anemia secondary to blood loss (chronic): Principal | ICD-10-CM | POA: Diagnosis present

## 2018-01-29 DIAGNOSIS — I10 Essential (primary) hypertension: Secondary | ICD-10-CM | POA: Diagnosis present

## 2018-01-29 DIAGNOSIS — R0602 Shortness of breath: Secondary | ICD-10-CM | POA: Diagnosis not present

## 2018-01-29 DIAGNOSIS — Z853 Personal history of malignant neoplasm of breast: Secondary | ICD-10-CM | POA: Diagnosis not present

## 2018-01-29 DIAGNOSIS — Z794 Long term (current) use of insulin: Secondary | ICD-10-CM | POA: Diagnosis not present

## 2018-01-29 DIAGNOSIS — E785 Hyperlipidemia, unspecified: Secondary | ICD-10-CM | POA: Diagnosis not present

## 2018-01-29 DIAGNOSIS — Z7982 Long term (current) use of aspirin: Secondary | ICD-10-CM | POA: Diagnosis not present

## 2018-01-29 DIAGNOSIS — K766 Portal hypertension: Secondary | ICD-10-CM | POA: Diagnosis present

## 2018-01-29 DIAGNOSIS — N183 Chronic kidney disease, stage 3 (moderate): Secondary | ICD-10-CM | POA: Diagnosis not present

## 2018-01-29 DIAGNOSIS — K7469 Other cirrhosis of liver: Secondary | ICD-10-CM | POA: Diagnosis not present

## 2018-01-29 DIAGNOSIS — D508 Other iron deficiency anemias: Secondary | ICD-10-CM | POA: Diagnosis not present

## 2018-01-29 DIAGNOSIS — Z8051 Family history of malignant neoplasm of kidney: Secondary | ICD-10-CM | POA: Diagnosis not present

## 2018-01-29 DIAGNOSIS — E1121 Type 2 diabetes mellitus with diabetic nephropathy: Secondary | ICD-10-CM | POA: Diagnosis present

## 2018-01-29 DIAGNOSIS — M069 Rheumatoid arthritis, unspecified: Secondary | ICD-10-CM | POA: Diagnosis present

## 2018-01-29 DIAGNOSIS — K921 Melena: Secondary | ICD-10-CM | POA: Diagnosis not present

## 2018-01-29 DIAGNOSIS — N179 Acute kidney failure, unspecified: Secondary | ICD-10-CM | POA: Diagnosis present

## 2018-01-29 DIAGNOSIS — E7800 Pure hypercholesterolemia, unspecified: Secondary | ICD-10-CM | POA: Diagnosis not present

## 2018-01-29 DIAGNOSIS — Z9049 Acquired absence of other specified parts of digestive tract: Secondary | ICD-10-CM

## 2018-01-29 DIAGNOSIS — M2550 Pain in unspecified joint: Secondary | ICD-10-CM | POA: Diagnosis not present

## 2018-01-29 LAB — COMPREHENSIVE METABOLIC PANEL
ALK PHOS: 67 U/L (ref 38–126)
ALT: 21 U/L (ref 0–44)
ANION GAP: 9 (ref 5–15)
AST: 39 U/L (ref 15–41)
Albumin: 3.4 g/dL — ABNORMAL LOW (ref 3.5–5.0)
BILIRUBIN TOTAL: 0.5 mg/dL (ref 0.3–1.2)
BUN: 26 mg/dL — ABNORMAL HIGH (ref 8–23)
CO2: 26 mmol/L (ref 22–32)
CREATININE: 2.02 mg/dL — AB (ref 0.44–1.00)
Calcium: 9.2 mg/dL (ref 8.9–10.3)
Chloride: 104 mmol/L (ref 98–111)
GFR calc non Af Amer: 23 mL/min — ABNORMAL LOW (ref >60)
GFR, EST AFRICAN AMERICAN: 26 mL/min — AB (ref >60)
GLUCOSE: 130 mg/dL — AB (ref 70–99)
Potassium: 4 mmol/L (ref 3.5–5.1)
Sodium: 139 mmol/L (ref 135–145)
Total Protein: 7.5 g/dL (ref 6.5–8.1)

## 2018-01-29 LAB — CBC WITH DIFFERENTIAL/PLATELET
Basophils Absolute: 0 10*3/uL (ref 0–0.1)
Basophils Relative: 1 %
Eosinophils Absolute: 0.9 10*3/uL — ABNORMAL HIGH (ref 0–0.7)
Eosinophils Relative: 14 %
HEMATOCRIT: 23.9 % — AB (ref 35.0–47.0)
HEMOGLOBIN: 7.6 g/dL — AB (ref 12.0–16.0)
LYMPHS ABS: 0.8 10*3/uL — AB (ref 1.0–3.6)
LYMPHS PCT: 13 %
MCH: 32.9 pg (ref 26.0–34.0)
MCHC: 31.7 g/dL — AB (ref 32.0–36.0)
MCV: 103.7 fL — ABNORMAL HIGH (ref 80.0–100.0)
MONOS PCT: 7 %
Monocytes Absolute: 0.4 10*3/uL (ref 0.2–0.9)
NEUTROS ABS: 4.3 10*3/uL (ref 1.4–6.5)
NEUTROS PCT: 65 %
Platelets: 235 10*3/uL (ref 150–440)
RBC: 2.3 MIL/uL — ABNORMAL LOW (ref 3.80–5.20)
RDW: 16 % — ABNORMAL HIGH (ref 11.5–14.5)
WBC: 6.5 10*3/uL (ref 3.6–11.0)

## 2018-01-29 LAB — BRAIN NATRIURETIC PEPTIDE: B Natriuretic Peptide: 279 pg/mL — ABNORMAL HIGH (ref 0.0–100.0)

## 2018-01-29 LAB — GLUCOSE, CAPILLARY
Glucose-Capillary: 81 mg/dL (ref 70–99)
Glucose-Capillary: 99 mg/dL (ref 70–99)

## 2018-01-29 LAB — TROPONIN I

## 2018-01-29 LAB — TYPE AND SCREEN
ABO/RH(D): O POS
ANTIBODY SCREEN: NEGATIVE

## 2018-01-29 LAB — AMMONIA: Ammonia: 29 umol/L (ref 9–35)

## 2018-01-29 MED ORDER — ALBUTEROL SULFATE (2.5 MG/3ML) 0.083% IN NEBU: 2.5000 mg | INHALATION_SOLUTION | RESPIRATORY_TRACT | Status: DC | PRN

## 2018-01-29 MED ORDER — ACETAMINOPHEN 325 MG PO TABS: 650.0000 mg | ORAL_TABLET | Freq: Four times a day (QID) | ORAL | Status: DC | PRN

## 2018-01-29 MED ORDER — TRAMADOL HCL 50 MG PO TABS
50.0000 mg | ORAL_TABLET | Freq: Four times a day (QID) | ORAL | Status: DC | PRN
  Administered 2018-01-30: 50 mg via ORAL
  Filled 2018-01-29: qty 1

## 2018-01-29 MED ORDER — SIMVASTATIN 20 MG PO TABS
20.0000 mg | ORAL_TABLET | Freq: Every day | ORAL | Status: DC
  Administered 2018-01-29 – 2018-01-30 (×2): 20 mg via ORAL
  Filled 2018-01-29 (×2): qty 1

## 2018-01-29 MED ORDER — LORATADINE 10 MG PO TABS
10.0000 mg | ORAL_TABLET | Freq: Every day | ORAL | Status: DC
  Administered 2018-01-29 – 2018-01-31 (×3): 10 mg via ORAL
  Filled 2018-01-29 (×3): qty 1

## 2018-01-29 MED ORDER — ONDANSETRON HCL 4 MG PO TABS: 4.0000 mg | ORAL_TABLET | Freq: Four times a day (QID) | ORAL | Status: DC | PRN

## 2018-01-29 MED ORDER — POLYETHYLENE GLYCOL 3350 17 G PO PACK: 17.0000 g | PACK | Freq: Every day | ORAL | Status: DC | PRN

## 2018-01-29 MED ORDER — INSULIN ASPART 100 UNIT/ML ~~LOC~~ SOLN
0.0000 [IU] | Freq: Every day | SUBCUTANEOUS | Status: DC
  Administered 2018-01-30: 3 [IU] via SUBCUTANEOUS
  Filled 2018-01-29: qty 1

## 2018-01-29 MED ORDER — NIACIN ER (ANTIHYPERLIPIDEMIC) 500 MG PO TBCR
500.0000 mg | EXTENDED_RELEASE_TABLET | Freq: Every evening | ORAL | Status: DC
  Administered 2018-01-29 – 2018-01-30 (×2): 500 mg via ORAL
  Filled 2018-01-29 (×3): qty 1

## 2018-01-29 MED ORDER — INSULIN GLARGINE 100 UNIT/ML ~~LOC~~ SOLN
24.0000 [IU] | Freq: Every morning | SUBCUTANEOUS | Status: DC
  Filled 2018-01-29: qty 0.24

## 2018-01-29 MED ORDER — HYDROXYZINE HCL 10 MG PO TABS
10.0000 mg | ORAL_TABLET | Freq: Every day | ORAL | Status: DC
  Administered 2018-01-29 – 2018-01-30 (×2): 10 mg via ORAL
  Filled 2018-01-29 (×3): qty 1

## 2018-01-29 MED ORDER — INSULIN ASPART 100 UNIT/ML ~~LOC~~ SOLN
0.0000 [IU] | Freq: Three times a day (TID) | SUBCUTANEOUS | Status: DC
  Administered 2018-01-30: 3 [IU] via SUBCUTANEOUS
  Administered 2018-01-31: 09:00:00 1 [IU] via SUBCUTANEOUS
  Administered 2018-01-31: 12:00:00 7 [IU] via SUBCUTANEOUS
  Filled 2018-01-29 (×3): qty 1

## 2018-01-29 MED ORDER — MAGNESIUM OXIDE 400 (241.3 MG) MG PO TABS
800.0000 mg | ORAL_TABLET | Freq: Every day | ORAL | Status: DC
  Administered 2018-01-30 – 2018-01-31 (×2): 800 mg via ORAL
  Filled 2018-01-29 (×2): qty 2

## 2018-01-29 MED ORDER — PANTOPRAZOLE SODIUM 40 MG PO TBEC
40.0000 mg | DELAYED_RELEASE_TABLET | Freq: Two times a day (BID) | ORAL | Status: DC
  Administered 2018-01-30 – 2018-01-31 (×2): 40 mg via ORAL
  Filled 2018-01-29 (×2): qty 1

## 2018-01-29 MED ORDER — GABAPENTIN 300 MG PO CAPS
600.0000 mg | ORAL_CAPSULE | Freq: Three times a day (TID) | ORAL | Status: DC
  Administered 2018-01-29 – 2018-01-31 (×4): 600 mg via ORAL
  Filled 2018-01-29 (×4): qty 2

## 2018-01-29 MED ORDER — LATANOPROST 0.005 % OP SOLN
1.0000 [drp] | Freq: Every day | OPHTHALMIC | Status: DC
  Administered 2018-01-29 – 2018-01-30 (×2): 1 [drp] via OPHTHALMIC
  Filled 2018-01-29: qty 2.5

## 2018-01-29 MED ORDER — PANTOPRAZOLE SODIUM 40 MG PO TBEC
40.0000 mg | DELAYED_RELEASE_TABLET | Freq: Once | ORAL | Status: AC
  Administered 2018-01-29: 40 mg via ORAL
  Filled 2018-01-29: qty 1

## 2018-01-29 MED ORDER — ONDANSETRON HCL 4 MG/2ML IJ SOLN: 4.0000 mg | Freq: Four times a day (QID) | INTRAMUSCULAR | Status: DC | PRN

## 2018-01-29 MED ORDER — ACETAMINOPHEN 650 MG RE SUPP: 650.0000 mg | Freq: Four times a day (QID) | RECTAL | Status: DC | PRN

## 2018-01-29 MED ORDER — INSULIN GLARGINE 100 UNIT/ML ~~LOC~~ SOLN
24.0000 [IU] | Freq: Every day | SUBCUTANEOUS | Status: DC
  Filled 2018-01-29: qty 0.24

## 2018-01-29 MED ORDER — FUROSEMIDE 10 MG/ML IJ SOLN
60.0000 mg | Freq: Two times a day (BID) | INTRAMUSCULAR | Status: DC
  Administered 2018-01-29 – 2018-01-30 (×3): 60 mg via INTRAVENOUS
  Filled 2018-01-29 (×3): qty 6

## 2018-01-29 NOTE — ED Notes (Signed)
Pt assisted to the bathroom then to be taken to triage 3.

## 2018-01-29 NOTE — ED Notes (Signed)
First RN note:  Patient here from gastro c/o increased shortness of breath, pitting edema, and Hgb of 7.3.

## 2018-01-29 NOTE — H&P (Signed)
[facility] [place] NAME: Emily Faulkner    MR#:  [id number]  DATE OF BIRTH:  11/12/1940  DATE OF ADMISSION:  01/29/2018  PRIMARY CARE PHYSICIAN: Sofie Hartigan, MD   REQUESTING/REFERRING PHYSICIAN: Dr. Corky Downs  CHIEF COMPLAINT:   Chief Complaint  Patient presents with  . Shortness of Breath    HISTORY OF PRESENT ILLNESS:  Emily Faulkner  is a 77 y.o. female with a known history of cirrhosis, hypertension, diabetes mellitus presents to the hospital sent in from the [facility] GI office due to anemia and heme positive stool.  Patient has also noticed abdominal distention and increasing bilateral lower extremity edema.  Hemoglobin is 7.6.  Patient does not have any melena.  Takes baby aspirin daily.  No NSAIDs.  Had EGD many years back but does not remember the results.   PAST MEDICAL HISTORY:   Past Medical History:  Diagnosis Date  . Cancer [facility])    breast cancer right mastectomy  . Diabetes mellitus without complication ([place])   . Glaucoma   . Hyperlipidemia   . Hypertension   . Neuropathy   . Osteoarthritis   . Rheumatoid arthritis ([place])   . Telangiectasias     PAST SURGICAL HISTORY:   Past Surgical History:  Procedure Laterality Date  . BREAST SURGERY     right mastectomy  . CHOLECYSTECTOMY    . COLONOSCOPY    . COLONOSCOPY WITH PROPOFOL N/A 08/14/2016   Procedure: COLONOSCOPY WITH PROPOFOL;  Surgeon: Lollie Sails, MD;  [facility];  Service: Endoscopy;  Laterality: N/A;  . ESOPHAGOGASTRODUODENOSCOPY (EGD) WITH PROPOFOL N/A 08/14/2016   Procedure: ESOPHAGOGASTRODUODENOSCOPY (EGD) WITH PROPOFOL;  Surgeon: Lollie Sails, MD;  [facility];  Service: Endoscopy;  Laterality: N/A;  . EXCISIONAL HEMORRHOIDECTOMY      SOCIAL HISTORY:   Social History   Tobacco Use  . Smoking status: Former Smoker    Last attempt to quit: 07/10/2011    Years since quitting: 6.5  . Smokeless tobacco: Never  Used  Substance Use Topics  . Alcohol use: No    FAMILY HISTORY:   Family History  Problem Relation Age of Onset  . Kidney cancer Son   . Bladder Cancer Neg Hx     DRUG ALLERGIES:  No Known Allergies  REVIEW OF SYSTEMS:   Review of Systems  Constitutional: Positive for malaise/fatigue. Negative for chills, fever and weight loss.  HENT: Negative for hearing loss, nosebleeds and sore throat.   Eyes: Negative for blurred vision, double vision and pain.  Respiratory: Negative for cough, hemoptysis, sputum production, shortness of breath and wheezing.   Cardiovascular: Positive for leg swelling. Negative for chest pain, palpitations and orthopnea.  Gastrointestinal: Negative for abdominal pain, constipation, diarrhea, heartburn, nausea and vomiting.  Genitourinary: Negative for dysuria and hematuria.  Musculoskeletal: Negative for back pain, falls, joint pain and myalgias.  Skin: Negative for rash.  Neurological: Negative for dizziness, tremors, sensory change, speech change, focal weakness, seizures and headaches.  Endo/Heme/Allergies: Does not bruise/bleed easily.  Psychiatric/Behavioral: Negative for depression and memory loss. The patient is not nervous/anxious.     MEDICATIONS AT HOME:   Prior to Admission medications   Medication Sig Start Date End Date Taking? Authorizing Provider  aspirin 81 MG EC tablet Take 81 mg by mouth daily.    Yes [provider]  clindamycin (CLEOCIN T) 1 % external solution Apply 1 application topically 2 (two) times daily. 12/31/17  Yes  [provider]  clobetasol (TEMOVATE) 0.05 % external solution Apply 1 application topically every evening. (apply to scalp) 01/25/18  Yes [provider]  Ergocalciferol (VITAMIN D2) 2000 units TABS Take 1 tablet by mouth daily.   Yes [provider]  ferrous fumarate (HEMOCYTE - 106 MG FE) 325 (106 Fe) MG TABS tablet Take 1 tablet by mouth daily.   Yes [provider]   furosemide (LASIX) 40 MG tablet Take 40 mg by mouth daily.    Yes [provider]  gabapentin (NEURONTIN) 300 MG capsule Take 600 mg by mouth 3 (three) times daily.    Yes [provider]  glipiZIDE (GLUCOTROL) 10 MG tablet Take 10 mg by mouth daily.    Yes [provider]  hydrOXYzine (ATARAX/VISTARIL) 10 MG tablet Take 10 mg by mouth at bedtime. 11/11/17  Yes [provider]  Insulin Glargine (LANTUS SOLOSTAR) 100 UNIT/ML Solostar Pen Inject 36 Units into the skin daily. (may take up to 50u based upon blood glucose reading)   Yes [provider]  ketoconazole (NIZORAL) 2 % shampoo Apply 1 application topically daily. (apply to scalp and wait at least 30 minutes before rinsing) 01/25/18  Yes [provider]  lisinopril (PRINIVIL,ZESTRIL) 2.5 MG tablet Take 2.5 mg by mouth daily.    Yes [provider]  loratadine (CLARITIN) 10 MG tablet Take 10 mg by mouth daily.   Yes [provider]  magnesium oxide (MAG-OX) 400 MG tablet Take 800 mg by mouth daily. 10/02/17  Yes [provider]  metFORMIN (GLUCOPHAGE) 850 MG tablet Take 425 mg by mouth 2 (two) times daily.    Yes [provider]  niacin (NIASPAN) 500 MG CR tablet Take 500 mg by mouth every evening. 12/25/17  Yes [provider]  Omega-3 Fatty Acids (FISH OIL) 1000 MG CAPS Take 1 capsule by mouth.    Yes [provider]  simvastatin (ZOCOR) 20 MG tablet Take 20 mg by mouth at bedtime. 12/23/17  Yes [provider]  sitaGLIPtin (JANUVIA) 50 MG tablet Take 50 mg by mouth daily.   Yes [provider]  TRAVATAN Z 0.004 % SOLN ophthalmic solution Place 1 drop into the left eye at bedtime. 12/05/17  Yes [provider]  vitamin B-12 (CYANOCOBALAMIN) 1000 MCG tablet Take 1,000 mcg by mouth daily.   Yes [provider]  RABEprazole (ACIPHEX) 20 MG tablet Take by mouth. 08/14/16 08/14/17  [provider]      VITAL SIGNS:  Blood pressure (!) 117/57, pulse 83, temperature 98.5 F (36.9 C), temperature source Oral, resp. rate 12, height 5' 4"  (1.626 m), weight 87.1 kg (192 lb), SpO2 100 %.  PHYSICAL EXAMINATION:  Physical Exam  GENERAL:  77 y.o.-year-old patient lying in the bed with no acute distress.  EYES: Pupils equal, round, reactive to light and accommodation. No scleral icterus. Extraocular muscles intact.  HEENT: Head atraumatic, normocephalic. Oropharynx and nasopharynx clear. No oropharyngeal erythema, moist oral mucosa  NECK:  Supple, no jugular venous distention. No thyroid enlargement, no tenderness.  LUNGS: Normal breath sounds bilaterally, no wheezing, rales, rhonchi. No use of accessory muscles of respiration.  CARDIOVASCULAR: S1, S2 normal. No murmurs, rubs, or gallops.  ABDOMEN: Soft, distended, nontender, distended. Bowel sounds present. No organomegaly or mass.  EXTREMITIES: No cyanosis, or clubbing. + 2 pedal & radial pulses b/l.  Bilateral lower extremity edema NEUROLOGIC: Cranial nerves II through XII are intact. No focal Motor or sensory deficits appreciated b/l PSYCHIATRIC:  The patient is alert and oriented x 3. Good affect.  SKIN: No obvious rash, lesion, or ulcer.   LABORATORY PANEL:   CBC Recent Labs  Lab 01/29/18 1549  WBC 6.5  HGB 7.6*  HCT 23.9*  PLT 235   ------------------------------------------------------------------------------------------------------------------  Chemistries  Recent Labs  Lab 01/29/18 1549  NA 139  K 4.0  CL 104  CO2 26  GLUCOSE 130*  BUN 26*  CREATININE 2.02*  CALCIUM 9.2  AST 39  ALT 21  ALKPHOS 67  BILITOT 0.5   ------------------------------------------------------------------------------------------------------------------  Cardiac Enzymes Recent Labs  Lab 01/29/18 1549  TROPONINI <0.03    ------------------------------------------------------------------------------------------------------------------  RADIOLOGY:  Dg Chest 2 View  Result Date: 01/29/2018 CLINICAL DATA:  Anemia, shortness of breath, fluid retention EXAM: CHEST - 2 VIEW COMPARISON:  02/27/2006 FINDINGS: Postop changes of the right breast and axilla. Mild cardiomegaly with vascular and diffuse interstitial prominence suggesting early interstitial edema versus interstitial lung disease. No significant effusion or pneumothorax. No focal pneumonia, collapse or consolidation. Trachea is midline. Aorta is atherosclerotic. Degenerative changes of the spine with a mild scoliosis. No acute compression fracture or focal kyphosis. IMPRESSION: Cardiomegaly with diffuse interstitial edema pattern versus chronic interstitial lung disease. No significant effusion or focal pneumonia Aortic atherosclerosis Postop changes as above Electronically Signed   By: Jerilynn Mages.  Shick M.D.   On: 01/29/2018 16:31     IMPRESSION AND PLAN:   *Anemia due to chronic GI losses.  With history of cirrhosis raises concern for variceal bleeding.  Will start Protonix twice daily.  No need for transfusion at this time.  Repeat hemoglobin.  Consult GI. Hold aspirin.  Patient will be on clears and n.p.o. after midnight.  *Cirrhosis with ascites and lower extremity edema.  Will start IV Lasix twice daily.  Monitor input and output.  Replace potassium as needed.  Check BMP in the morning. Echocardiogram from earlier in the year reviewed and showed normal ejection fraction.  *Hypertension.  Hold lisinopril at this time.  *Diabetes mellitus.  Reduced Lantus dose to 24 units.  Sliding scale insulin added.  *DVT prophylaxis with SCDs  All the records are reviewed and case discussed with ED provider. Management plans discussed with the patient, family and they are in agreement.  CODE STATUS: Full code  TOTAL TIME TAKING CARE OF THIS PATIENT: 40 minutes.    Leia Alf Layten Aiken M.D on 01/29/2018 at 5:03 PM  Between 7am to 6pm - Pager - [phone]  After 6pm go to [email or web address] - password [email or web address] [facility]  Office  [phone]  CC: Primary care physician; Sofie Hartigan, MD  Note: This dictation was prepared with Dragon dictation along with smaller phrase technology. Any transcriptional errors that result from this process are unintentional.

## 2018-01-29 NOTE — ED Provider Notes (Addendum)
[facility] Emergency Department Provider Note  ____________________________________________   First MD Initiated Contact with Patient 01/29/18 1553     (approximate)  I have reviewed the triage vital signs and the nursing notes.   HISTORY  Chief Complaint Shortness of Breath   HPI Emily Faulkner is a 77 y.o. female who comes to the emergency department from [place] [facility] for symptomatic anemia.  She does report some blood in her stool recently.  She feels generally weak and tired.  Her symptoms have been insidious onset slowly progressive are now moderate severity.  She has cramping lower abdominal pain that is intermittent.  Nothing seems to make it better or worse.   Shortness of breath with exertion.   Past Medical History:  Diagnosis Date  . Cancer [facility])    breast cancer right mastectomy  . Diabetes mellitus without complication ([place])   . Glaucoma   . Hyperlipidemia   . Hypertension   . Neuropathy   . Osteoarthritis   . Rheumatoid arthritis ([place])   . Telangiectasias     Patient Active Problem List   Diagnosis Date Noted  . Microalbuminuric diabetic nephropathy ([place]) 11/02/2016  . Diabetes mellitus, type 2 ([place]) 11/02/2016  . Diabetic neuropathy, type II diabetes mellitus ([place]) 11/02/2016  . Pulmonary hypertension ([place]) 09/11/2016  . Shortness of breath 09/06/2016  . Hypertension 09/06/2016  . Hyperkalemia 09/06/2016  . High cholesterol 09/06/2016  . Anemia 09/06/2016  . Diabetes mellitus ([place]) 09/06/2016  . Iron deficiency anemia 04/15/2016  . Type 2 diabetes mellitus with neurological manifestations, uncontrolled ([place]) 08/04/2014  . Hyperlipidemia, unspecified 01/22/2014  . Personal history of breast cancer 07/17/2012    Past Surgical History:  Procedure Laterality Date  . BREAST SURGERY     right mastectomy  . CHOLECYSTECTOMY    . COLONOSCOPY    . COLONOSCOPY WITH PROPOFOL N/A 08/14/2016   Procedure: COLONOSCOPY WITH  PROPOFOL;  Surgeon: Lollie Sails, MD;  Location: [facility] ENDOSCOPY;  Service: Endoscopy;  Laterality: N/A;  . ESOPHAGOGASTRODUODENOSCOPY (EGD) WITH PROPOFOL N/A 08/14/2016   Procedure: ESOPHAGOGASTRODUODENOSCOPY (EGD) WITH PROPOFOL;  Surgeon: Lollie Sails, MD;  Location: [facility] ENDOSCOPY;  Service: Endoscopy;  Laterality: N/A;  . EXCISIONAL HEMORRHOIDECTOMY      Prior to Admission medications   Medication Sig Start Date End Date Taking? Authorizing Provider  aspirin 81 MG EC tablet Take 81 mg by mouth daily.    Yes [provider]  clindamycin (CLEOCIN T) 1 % external solution Apply 1 application topically 2 (two) times daily. 12/31/17  Yes [provider]  clobetasol (TEMOVATE) 0.05 % external solution Apply 1 application topically every evening. (apply to scalp) 01/25/18  Yes [provider]  Ergocalciferol (VITAMIN D2) 2000 units TABS Take 1 tablet by mouth daily.   Yes [provider]  gabapentin (NEURONTIN) 300 MG capsule Take 600 mg by mouth 3 (three) times daily.    Yes [provider]  glipiZIDE (GLUCOTROL) 10 MG tablet Take 10 mg by mouth daily.    Yes [provider]  hydrOXYzine (ATARAX/VISTARIL) 10 MG tablet Take 10 mg by mouth at bedtime. 11/11/17  Yes [provider]  Insulin Glargine (LANTUS SOLOSTAR) 100 UNIT/ML Solostar Pen Inject 36 Units into the skin daily. (may take up to 50u based upon blood glucose reading)   Yes [provider]  ketoconazole (NIZORAL) 2 % shampoo Apply 1 application topically daily. (apply to scalp and wait at least 30 minutes before rinsing) 01/25/18  Yes  [provider]  lisinopril (PRINIVIL,ZESTRIL) 2.5 MG tablet Take 2.5 mg by mouth daily.    Yes [provider]  loratadine (CLARITIN) 10 MG tablet Take 10 mg by mouth daily.   Yes [provider]  magnesium oxide (MAG-OX) 400 MG tablet Take 800 mg by mouth daily. 10/02/17  Yes [provider]  niacin  (NIASPAN) 500 MG CR tablet Take 500 mg by mouth every evening. 12/25/17  Yes [provider]  Omega-3 Fatty Acids (FISH OIL) 1000 MG CAPS Take 1 capsule by mouth.    Yes [provider]  simvastatin (ZOCOR) 20 MG tablet Take 20 mg by mouth at bedtime. 12/23/17  Yes [provider]  sitaGLIPtin (JANUVIA) 50 MG tablet Take 50 mg by mouth daily.   Yes [provider]  TRAVATAN Z 0.004 % SOLN ophthalmic solution Place 1 drop into the left eye at bedtime. 12/05/17  Yes [provider]  vitamin B-12 (CYANOCOBALAMIN) 1000 MCG tablet Take 1,000 mcg by mouth daily.   Yes [provider]  ferrous fumarate (HEMOCYTE - 106 MG FE) 325 (106 Fe) MG TABS tablet Take 1 tablet (106 mg of iron total) by mouth 2 (two) times daily. 01/31/18   Hillary Bow, MD  furosemide (LASIX) 40 MG tablet Take 1 tablet (40 mg total) by mouth 2 (two) times daily. 01/31/18   Hillary Bow, MD  pantoprazole (PROTONIX) 40 MG tablet Take 1 tablet (40 mg total) by mouth 2 (two) times daily before a meal. 01/31/18   Hillary Bow, MD    Allergies Patient has no known allergies.  Family History  Problem Relation Age of Onset  . Kidney cancer Son   . Bladder Cancer Neg Hx     Social History Social History   Tobacco Use  . Smoking status: Former Smoker    Last attempt to quit: 07/10/2011    Years since quitting: 6.5  . Smokeless tobacco: Never Used  Substance Use Topics  . Alcohol use: No  . Drug use: No    Review of Systems Constitutional: No fever/chills Eyes: No visual changes. ENT: No sore throat. Cardiovascular: Denies chest pain. Respiratory: Positive for shortness of breath. Gastrointestinal: Positive for abdominal pain.  No nausea, no vomiting.  No diarrhea.  No constipation. Genitourinary: Negative for dysuria. Musculoskeletal: Negative for back pain. Skin: Negative for rash. Neurological: Negative for headaches, focal weakness or  numbness.   ____________________________________________   PHYSICAL EXAM:  VITAL SIGNS: ED Triage Vitals  Enc Vitals Group     BP 01/29/18 1546 (!) 145/51     Pulse Rate 01/29/18 1546 86     Resp 01/29/18 1546 (!) 22     Temp 01/29/18 1546 98.5 F (36.9 C)     Temp Source 01/29/18 1546 Oral     SpO2 01/29/18 1546 100 %     Weight 01/29/18 1542 192 lb (87.1 kg)     Height 01/29/18 1542 5' 4"  (1.626 m)     Head Circumference --      Peak Flow --      Pain Score 01/29/18 1542 0     Pain Loc --      Pain Edu? --      Excl. in [place]? --     Constitutional: Alert and oriented x4 appears pale and obviously short of breath Eyes: PERRL EOMI. conjunctival pallor Head: Atraumatic. Nose: No congestion/rhinnorhea. Mouth/Throat: No trismus Neck: No stridor.   Cardiovascular: Tachycardic rate, regular rhythm. Grossly normal heart sounds.  Good peripheral circulation. Respiratory: Increased respiratory effort.  No retractions. Lungs CTAB and moving good air Gastrointestinal: Soft mild diffuse tenderness with no peritonitis no focality Musculoskeletal: No lower extremity edema   Neurologic:  Normal speech and language. No gross focal neurologic deficits are appreciated. Skin:  Skin is warm, dry and intact. No rash noted. Psychiatric: Mood and affect are normal. Speech and behavior are normal.    ____________________________________________   DIFFERENTIAL includes but not limited to  Esophageal varices, gastric varices, upper GI bleed, lower GI bleed, diverticulitis ____________________________________________   LABS (all labs ordered are listed, but only abnormal results are displayed)  Labs Reviewed  CBC WITH DIFFERENTIAL/PLATELET - Abnormal; Notable for the following components:      Result Value   RBC 2.30 (*)    Hemoglobin 7.6 (*)    HCT 23.9 (*)    MCV 103.7 (*)    MCHC 31.7 (*)    RDW 16.0 (*)    Lymphs Abs 0.8 (*)    Eosinophils Absolute 0.9 (*)    All other  components within normal limits  COMPREHENSIVE METABOLIC PANEL - Abnormal; Notable for the following components:   Glucose, Bld 130 (*)    BUN 26 (*)    Creatinine, Ser 2.02 (*)    Albumin 3.4 (*)    GFR calc non Af Amer 23 (*)    GFR calc Af Amer 26 (*)    All other components within normal limits  BRAIN NATRIURETIC PEPTIDE - Abnormal; Notable for the following components:   B Natriuretic Peptide 279.0 (*)    All other components within normal limits  HEMOGLOBIN A1C - Abnormal; Notable for the following components:   Hgb A1c MFr Bld 6.3 (*)    All other components within normal limits  BASIC METABOLIC PANEL - Abnormal; Notable for the following components:   BUN 27 (*)    Creatinine, Ser 1.59 (*)    GFR calc non Af Amer 30 (*)    GFR calc Af Amer 35 (*)    All other components within normal limits  CBC - Abnormal; Notable for the following components:   RBC 2.22 (*)    Hemoglobin 7.4 (*)    HCT 22.7 (*)    MCV 102.5 (*)    RDW 15.8 (*)    All other components within normal limits  PROTIME-INR - Abnormal; Notable for the following components:   Prothrombin Time 16.0 (*)    All other components within normal limits  GLUCOSE, CAPILLARY - Abnormal; Notable for the following components:   Glucose-Capillary 225 (*)    All other components within normal limits  CBC WITH DIFFERENTIAL/PLATELET - Abnormal; Notable for the following components:   RBC 2.28 (*)    Hemoglobin 7.5 (*)    HCT 23.1 (*)    MCV 101.5 (*)    RDW 15.8 (*)    All other components within normal limits  BASIC METABOLIC PANEL - Abnormal; Notable for the following components:   Glucose, Bld 145 (*)    BUN 29 (*)    Creatinine, Ser 1.78 (*)    GFR calc non Af Amer 27 (*)    GFR calc Af Amer 31 (*)    All other components within normal limits  GLUCOSE, CAPILLARY - Abnormal; Notable for the following components:   Glucose-Capillary 285 (*)    All other components within normal limits  GLUCOSE, CAPILLARY -  Abnormal; Notable for the following components:   Glucose-Capillary 145 (*)  All other components within normal limits  GLUCOSE, CAPILLARY - Abnormal; Notable for the following components:   Glucose-Capillary 349 (*)    All other components within normal limits  TROPONIN I  GLUCOSE, CAPILLARY  AMMONIA  GLUCOSE, CAPILLARY  GLUCOSE, CAPILLARY  GLUCOSE, CAPILLARY  TYPE AND SCREEN    Lab work reviewed by me with significant iron deficiency anemia __________________________________________  EKG  ED ECG REPORT I, Darel Hong, the attending physician, personally viewed and interpreted this ECG.  Date: 02/03/2018 EKG Time:  Rate: 89 Rhythm: normal sinus rhythm QRS Axis: Leftward axis Intervals: normal ST/T Wave abnormalities: normal Narrative Interpretation: no evidence of acute ischemia  ____________________________________________  RADIOLOGY  Chest x-ray reviewed by me consistent with diffuse interstitial edema ____________________________________________   PROCEDURES  Procedure(s) performed: no  .Critical Care Performed by: Darel Hong, MD Authorized by: Darel Hong, MD   Critical care provider statement:    Critical care time (minutes):  30   Critical care time was exclusive of:  Separately billable procedures and treating other patients   Critical care was necessary to treat or prevent imminent or life-threatening deterioration of the following conditions: anemia.   Critical care was time spent personally by me on the following activities:  Development of treatment plan with patient or surrogate, discussions with consultants, evaluation of patient's response to treatment, examination of patient, obtaining history from patient or surrogate, ordering and performing treatments and interventions, ordering and review of laboratory studies, ordering and review of radiographic studies, pulse oximetry, re-evaluation of patient's condition and review of old  charts    Critical Care performed: no  ____________________________________________   INITIAL IMPRESSION / [place] / ED COURSE  Pertinent labs & imaging results that were available during my care of the patient were reviewed by me and considered in my medical decision making (see chart for details).   As part of my medical decision making, I reviewed the following data within the [place] History obtained from family if available, nursing notes, old chart and ekg, as well as notes from prior ED visits.  The patient arrives tachycardic, pale, and anemic.  Concern for ongoing GI bleed so she will require inpatient admission for PPI, blood transfusion, and likely endoscopy.  She verbalizes understanding agreement with the plan.      ____________________________________________   FINAL CLINICAL IMPRESSION(S) / ED DIAGNOSES  Final diagnoses:  Gastrointestinal hemorrhage, unspecified gastrointestinal hemorrhage type      NEW MEDICATIONS STARTED DURING THIS VISIT:  Discharge Medication List as of 01/31/2018 11:35 AM    START taking these medications   Details  pantoprazole (PROTONIX) 40 MG tablet Take 1 tablet (40 mg total) by mouth 2 (two) times daily before a meal., Starting Fri 01/31/2018, Normal         Note:  This document was prepared using Dragon voice recognition software and may include unintentional dictation errors.     Darel Hong, MD 02/03/18 1221    Darel Hong, MD 02/08/18 2200

## 2018-01-29 NOTE — Consult Note (Signed)
[facility] GI Inpatient Consult Note   Kathline Magic, M.D.  Reason for Consult: Symptomatic anemia,  cirrhosis   Attending Requesting Consult: Hillary Bow, M.D.  Outpatient Primary Physician: Thereasa Distance, M.D.  History of Present Illness: Emily Faulkner is a 77 y.o. femalewith a history of cryptogenic cirrhosis presenting after ER referral from our office a [facility] earlier this afternoon. Patient has been noticing progressive dyspnea, fatigue and increased abdominal distention for the last several days. She remarks a 20 pound weight gain in the last 3 months, although she is only apparently gained about 8 pounds by our records in the past 3 months. The patient denies any abdominal pain just some discomfort around the area and umbilical region. No melena or hematemesis. Stools are sometimes "dark" the morning after she takes an iron supplement. She has undergone IV iron infusions in the past by hematologist Dr. Grayland Ormond. Her iron deficiency appears chronic although her hemoglobin has dropped about 1 g or more from her baseline of around 8.7. Last EGD and colonoscopy performed in February 2202 revealed Helicobacter pylori positive gastritis without evidence of gastroesophageal varices. Colonoscopy revealed a fair colonic prep with biopsies of the colonic mucosa negative for colitis. A rectal polyp was removed the histology of which was benign.  Past Medical History:  Past Medical History:  Diagnosis Date  . Cancer [facility])    breast cancer right mastectomy  . Diabetes mellitus without complication ([place])   . Glaucoma   . Hyperlipidemia   . Hypertension   . Neuropathy   . Osteoarthritis   . Rheumatoid arthritis ([place])   . Telangiectasias     Problem List: Patient Active Problem List   Diagnosis Date Noted  . Microalbuminuric diabetic nephropathy ([place]) 11/02/2016  . Diabetes mellitus, type 2 ([place]) 11/02/2016  . Diabetic neuropathy, type II diabetes mellitus ([place])  11/02/2016  . Pulmonary hypertension ([place]) 09/11/2016  . Shortness of breath 09/06/2016  . Hypertension 09/06/2016  . Hyperkalemia 09/06/2016  . High cholesterol 09/06/2016  . Anemia 09/06/2016  . Diabetes mellitus ([place]) 09/06/2016  . Iron deficiency anemia 04/15/2016  . Type 2 diabetes mellitus with neurological manifestations, uncontrolled ([place]) 08/04/2014  . Hyperlipidemia, unspecified 01/22/2014  . Personal history of breast cancer 07/17/2012    Past Surgical History: Past Surgical History:  Procedure Laterality Date  . BREAST SURGERY     right mastectomy  . CHOLECYSTECTOMY    . COLONOSCOPY    . COLONOSCOPY WITH PROPOFOL N/A 08/14/2016   Procedure: COLONOSCOPY WITH PROPOFOL;  Surgeon: Lollie Sails, MD;  Location: [facility] ENDOSCOPY;  Service: Endoscopy;  Laterality: N/A;  . ESOPHAGOGASTRODUODENOSCOPY (EGD) WITH PROPOFOL N/A 08/14/2016   Procedure: ESOPHAGOGASTRODUODENOSCOPY (EGD) WITH PROPOFOL;  Surgeon: Lollie Sails, MD;  Location[facility] ENDOSCOPY;  Service: Endoscopy;  Laterality: N/A;  . EXCISIONAL HEMORRHOIDECTOMY      Allergies: No Known Allergies  Home Medications: Medications Prior to Admission  Medication Sig Dispense Refill Last Dose  . aspirin 81 MG EC tablet Take 81 mg by mouth daily.    01/29/2018 at 0800  . clindamycin (CLEOCIN T) 1 % external solution Apply 1 application topically 2 (two) times daily.  2 01/29/2018 at 0800  . clobetasol (TEMOVATE) 0.05 % external solution Apply 1 application topically every evening. (apply to scalp)   01/28/2018 at 1900  . Ergocalciferol (VITAMIN D2) 2000 units TABS Take 1 tablet by mouth daily.   01/29/2018 at 0800  . ferrous fumarate (HEMOCYTE - 106 MG FE) 325 (106  Fe) MG TABS tablet Take 1 tablet by mouth daily.   01/29/2018 at 0800  . furosemide (LASIX) 40 MG tablet Take 40 mg by mouth daily.    01/29/2018 at 0800  . gabapentin (NEURONTIN) 300 MG capsule Take 600 mg by mouth 3 (three) times daily.    01/29/2018 at 0800  .  glipiZIDE (GLUCOTROL) 10 MG tablet Take 10 mg by mouth daily.    01/29/2018 at 0800  . hydrOXYzine (ATARAX/VISTARIL) 10 MG tablet Take 10 mg by mouth at bedtime.  1 01/28/2018 at 2000  . Insulin Glargine (LANTUS SOLOSTAR) 100 UNIT/ML Solostar Pen Inject 36 Units into the skin daily. (may take up to 50u based upon blood glucose reading)   As directed at As directed  . ketoconazole (NIZORAL) 2 % shampoo Apply 1 application topically daily. (apply to scalp and wait at least 30 minutes before rinsing)   01/29/2018 at 0800  . lisinopril (PRINIVIL,ZESTRIL) 2.5 MG tablet Take 2.5 mg by mouth daily.    01/29/2018 at 0800  . loratadine (CLARITIN) 10 MG tablet Take 10 mg by mouth daily.   PRN at PRN  . magnesium oxide (MAG-OX) 400 MG tablet Take 800 mg by mouth daily.   01/29/2018 at 0800  . metFORMIN (GLUCOPHAGE) 850 MG tablet Take 425 mg by mouth 2 (two) times daily.    01/29/2018 at 0800  . niacin (NIASPAN) 500 MG CR tablet Take 500 mg by mouth every evening.  2 01/28/2018 at 2000  . Omega-3 Fatty Acids (FISH OIL) 1000 MG CAPS Take 1 capsule by mouth.    01/29/2018 at 0800  . simvastatin (ZOCOR) 20 MG tablet Take 20 mg by mouth at bedtime.  6 01/28/2018 at 2000  . sitaGLIPtin (JANUVIA) 50 MG tablet Take 50 mg by mouth daily.   01/29/2018 at 0800  . TRAVATAN Z 0.004 % SOLN ophthalmic solution Place 1 drop into the left eye at bedtime.   01/28/2018 at 2000  . vitamin B-12 (CYANOCOBALAMIN) 1000 MCG tablet Take 1,000 mcg by mouth daily.   01/29/2018 at 0800  . RABEprazole (ACIPHEX) 20 MG tablet Take by mouth.   Taking   Home medication reconciliation was completed with the patient.   Scheduled Inpatient Medications:   . furosemide  60 mg Intravenous BID  . gabapentin  600 mg Oral TID  . hydrOXYzine  10 mg Oral QHS  . insulin aspart  0-5 Units Subcutaneous QHS  . insulin aspart  0-9 Units Subcutaneous TID WC  . insulin glargine  24 Units Subcutaneous Daily  . latanoprost  1 drop Left Eye QHS  . loratadine  10 mg  Oral Daily  . [START ON 01/30/2018] magnesium oxide  800 mg Oral Daily  . niacin  500 mg Oral QPM  . [START ON 01/30/2018] pantoprazole  40 mg Oral BID AC  . simvastatin  20 mg Oral QHS    Continuous Inpatient Infusions:    PRN Inpatient Medications:  acetaminophen **OR** acetaminophen, albuterol, ondansetron **OR** ondansetron (ZOFRAN) IV, polyethylene glycol, traMADol  Family History: family history includes Kidney cancer in her son.   GI Family History: Negative.  Social History:   reports that she quit smoking about 6 years ago. She has never used smokeless tobacco. She reports that she does not drink alcohol or use drugs. The patient denies ETOH, tobacco, or drug use.    Review of Systems: Review of Systems - Negative except As in history of present illness as well as stated below History obtained  from the patient General ROS: positive for  - fatigue and weight gain Psychological ROS: negative ENT ROS: negative Allergy and Immunology ROS: negative Endocrine ROS: positive for - unexpected weight changes Respiratory ROS: positive for - shortness of breath Cardiovascular ROS: no chest pain or dyspnea on exertion Genito-Urinary ROS: negative Musculoskeletal ROS: negative Neurological ROS: no TIA or stroke symptoms Dermatological ROS: negative  Physical Examination: BP (!) 126/49 (BP Location: Left Arm)   Pulse 92   Temp 99.3 F (37.4 C) (Oral)   Resp 20   Ht 5' 4.5" (1.638 m)   Wt 84.8 kg (186 lb 14.4 oz)   SpO2 98%   BMI 31.59 kg/m  Physical Exam  Data: Lab Results  Component Value Date   WBC 6.5 01/29/2018   HGB 7.6 (L) 01/29/2018   HCT 23.9 (L) 01/29/2018   MCV 103.7 (H) 01/29/2018   PLT 235 01/29/2018   Recent Labs  Lab 01/29/18 1549  HGB 7.6*   Lab Results  Component Value Date   NA 139 01/29/2018   K 4.0 01/29/2018   CL 104 01/29/2018   CO2 26 01/29/2018   BUN 26 (H) 01/29/2018   CREATININE 2.02 (H) 01/29/2018   Lab Results  Component  Value Date   ALT 21 01/29/2018   AST 39 01/29/2018   ALKPHOS 67 01/29/2018   BILITOT 0.5 01/29/2018   No results for input(s): APTT, INR, PTT in the last 168 hours. CBC Latest Ref Rng & Units 01/29/2018 05/15/2017 01/08/2017  WBC 3.6 - 11.0 K/uL 6.5 8.0 8.9  Hemoglobin 12.0 - 16.0 g/dL 7.6(L) 11.0(L) 10.5(L)  Hematocrit 35.0 - 47.0 % 23.9(L) 32.7(L) 31.5(L)  Platelets 150 - 440 K/uL 235 196 220    STUDIES: Dg Chest 2 View  Result Date: 01/29/2018 CLINICAL DATA:  Anemia, shortness of breath, fluid retention EXAM: CHEST - 2 VIEW COMPARISON:  02/27/2006 FINDINGS: Postop changes of the right breast and axilla. Mild cardiomegaly with vascular and diffuse interstitial prominence suggesting early interstitial edema versus interstitial lung disease. No significant effusion or pneumothorax. No focal pneumonia, collapse or consolidation. Trachea is midline. Aorta is atherosclerotic. Degenerative changes of the spine with a mild scoliosis. No acute compression fracture or focal kyphosis. IMPRESSION: Cardiomegaly with diffuse interstitial edema pattern versus chronic interstitial lung disease. No significant effusion or focal pneumonia Aortic atherosclerosis Postop changes as above Electronically Signed   By: Jerilynn Mages.  Shick M.D.   On: 01/29/2018 16:31   @IMAGES @  Assessment: 1. Abdominal and peripheral edema-likely secondary to excessiveswelling from portal hypertension and ascites.  2. Cirrhosis-no obvious encephalopathy at this time. Patient admitted from recent and remote medical events appears good.  3. Acute on chronic anemia. Must rule out occult gastrointestinal blood loss. There does not appear to be any acute gastrointestinal blood loss although the cause of this may include portal hypertensive gastropathy or peptic ulcer disease. Variceal bleeding appears less likely in the setting of nonacute, symptomatic anemia  4. History of poorly controlled type 2 diabetes mellitus, insulin-requiring.  5.  Diabetic nephropathy-serum creatinine of 2.00.  6. Hx of EGD and colonoscopy in 2018. Colonoscopy unremarkable for tumors despite fair prep.   Recommendations:  1. Follow hemoglobin level. 2. Agree with empiric acid suppression. 3. Planned abdominal paracentesis, ultrasound-guided. 4. EGD tomorrow.The patient understands the nature of the planned procedure, indications, risks, alternatives and potential complications including but not limited to bleeding, infection, perforation, damage to internal organs and possible oversedation/side effects from anesthesia. The patient agrees and gives consent  to proceed.  Please refer to procedure notes for findings, recommendations and patient disposition/instructions. 5. Check serum ammonia.  Thank you for the consult. Please call with questions or concerns.  Olean Ree, "Lanny Hurst MD [facility] [place], [place] [address] [phone]  01/29/2018 6:42 PM

## 2018-01-29 NOTE — ED Notes (Signed)
Pt unhooked from cardiac monitor and assisted to the toilet to urinate.

## 2018-01-29 NOTE — H&P (View-Only) (Signed)
[facility] GI Inpatient Consult Note   Kathline Magic, M.D.  Reason for Consult: Symptomatic anemia,  cirrhosis   Attending Requesting Consult: Hillary Bow, M.D.  Outpatient Primary Physician: Thereasa Distance, M.D.  History of Present Illness: Emily Faulkner is a 77 y.o. femalewith a history of cryptogenic cirrhosis presenting after ER referral from our office a [facility] earlier this afternoon. Patient has been noticing progressive dyspnea, fatigue and increased abdominal distention for the last several days. She remarks a 20 pound weight gain in the last 3 months, although she is only apparently gained about 8 pounds by our records in the past 3 months. The patient denies any abdominal pain just some discomfort around the area and umbilical region. No melena or hematemesis. Stools are sometimes "dark" the morning after she takes an iron supplement. She has undergone IV iron infusions in the past by hematologist Dr. Grayland Ormond. Her iron deficiency appears chronic although her hemoglobin has dropped about 1 g or more from her baseline of around 8.7. Last EGD and colonoscopy performed in February 5329 revealed Helicobacter pylori positive gastritis without evidence of gastroesophageal varices. Colonoscopy revealed a fair colonic prep with biopsies of the colonic mucosa negative for colitis. A rectal polyp was removed the histology of which was benign.  Past Medical History:  Past Medical History:  Diagnosis Date  . Cancer [facility])    breast cancer right mastectomy  . Diabetes mellitus without complication ([place])   . Glaucoma   . Hyperlipidemia   . Hypertension   . Neuropathy   . Osteoarthritis   . Rheumatoid arthritis ([place])   . Telangiectasias     Problem List: Patient Active Problem List   Diagnosis Date Noted  . Microalbuminuric diabetic nephropathy ([place]) 11/02/2016  . Diabetes mellitus, type 2 ([place]) 11/02/2016  . Diabetic neuropathy, type II diabetes mellitus ([place])  11/02/2016  . Pulmonary hypertension ([place]) 09/11/2016  . Shortness of breath 09/06/2016  . Hypertension 09/06/2016  . Hyperkalemia 09/06/2016  . High cholesterol 09/06/2016  . Anemia 09/06/2016  . Diabetes mellitus ([place]) 09/06/2016  . Iron deficiency anemia 04/15/2016  . Type 2 diabetes mellitus with neurological manifestations, uncontrolled ([place]) 08/04/2014  . Hyperlipidemia, unspecified 01/22/2014  . Personal history of breast cancer 07/17/2012    Past Surgical History: Past Surgical History:  Procedure Laterality Date  . BREAST SURGERY     right mastectomy  . CHOLECYSTECTOMY    . COLONOSCOPY    . COLONOSCOPY WITH PROPOFOL N/A 08/14/2016   Procedure: COLONOSCOPY WITH PROPOFOL;  Surgeon: Lollie Sails, MD;  Location[facility] ENDOSCOPY;  Service: Endoscopy;  Laterality: N/A;  . ESOPHAGOGASTRODUODENOSCOPY (EGD) WITH PROPOFOL N/A 08/14/2016   Procedure: ESOPHAGOGASTRODUODENOSCOPY (EGD) WITH PROPOFOL;  Surgeon: Lollie Sails, MD;  Location[facility] ENDOSCOPY;  Service: Endoscopy;  Laterality: N/A;  . EXCISIONAL HEMORRHOIDECTOMY      Allergies: No Known Allergies  Home Medications: Medications Prior to Admission  Medication Sig Dispense Refill Last Dose  . aspirin 81 MG EC tablet Take 81 mg by mouth daily.    01/29/2018 at 0800  . clindamycin (CLEOCIN T) 1 % external solution Apply 1 application topically 2 (two) times daily.  2 01/29/2018 at 0800  . clobetasol (TEMOVATE) 0.05 % external solution Apply 1 application topically every evening. (apply to scalp)   01/28/2018 at 1900  . Ergocalciferol (VITAMIN D2) 2000 units TABS Take 1 tablet by mouth daily.   01/29/2018 at 0800  . ferrous fumarate (HEMOCYTE - 106 MG FE) 325 (106  Fe) MG TABS tablet Take 1 tablet by mouth daily.   01/29/2018 at 0800  . furosemide (LASIX) 40 MG tablet Take 40 mg by mouth daily.    01/29/2018 at 0800  . gabapentin (NEURONTIN) 300 MG capsule Take 600 mg by mouth 3 (three) times daily.    01/29/2018 at 0800  .  glipiZIDE (GLUCOTROL) 10 MG tablet Take 10 mg by mouth daily.    01/29/2018 at 0800  . hydrOXYzine (ATARAX/VISTARIL) 10 MG tablet Take 10 mg by mouth at bedtime.  1 01/28/2018 at 2000  . Insulin Glargine (LANTUS SOLOSTAR) 100 UNIT/ML Solostar Pen Inject 36 Units into the skin daily. (may take up to 50u based upon blood glucose reading)   As directed at As directed  . ketoconazole (NIZORAL) 2 % shampoo Apply 1 application topically daily. (apply to scalp and wait at least 30 minutes before rinsing)   01/29/2018 at 0800  . lisinopril (PRINIVIL,ZESTRIL) 2.5 MG tablet Take 2.5 mg by mouth daily.    01/29/2018 at 0800  . loratadine (CLARITIN) 10 MG tablet Take 10 mg by mouth daily.   PRN at PRN  . magnesium oxide (MAG-OX) 400 MG tablet Take 800 mg by mouth daily.   01/29/2018 at 0800  . metFORMIN (GLUCOPHAGE) 850 MG tablet Take 425 mg by mouth 2 (two) times daily.    01/29/2018 at 0800  . niacin (NIASPAN) 500 MG CR tablet Take 500 mg by mouth every evening.  2 01/28/2018 at 2000  . Omega-3 Fatty Acids (FISH OIL) 1000 MG CAPS Take 1 capsule by mouth.    01/29/2018 at 0800  . simvastatin (ZOCOR) 20 MG tablet Take 20 mg by mouth at bedtime.  6 01/28/2018 at 2000  . sitaGLIPtin (JANUVIA) 50 MG tablet Take 50 mg by mouth daily.   01/29/2018 at 0800  . TRAVATAN Z 0.004 % SOLN ophthalmic solution Place 1 drop into the left eye at bedtime.   01/28/2018 at 2000  . vitamin B-12 (CYANOCOBALAMIN) 1000 MCG tablet Take 1,000 mcg by mouth daily.   01/29/2018 at 0800  . RABEprazole (ACIPHEX) 20 MG tablet Take by mouth.   Taking   Home medication reconciliation was completed with the patient.   Scheduled Inpatient Medications:   . furosemide  60 mg Intravenous BID  . gabapentin  600 mg Oral TID  . hydrOXYzine  10 mg Oral QHS  . insulin aspart  0-5 Units Subcutaneous QHS  . insulin aspart  0-9 Units Subcutaneous TID WC  . insulin glargine  24 Units Subcutaneous Daily  . latanoprost  1 drop Left Eye QHS  . loratadine  10 mg  Oral Daily  . [START ON 01/30/2018] magnesium oxide  800 mg Oral Daily  . niacin  500 mg Oral QPM  . [START ON 01/30/2018] pantoprazole  40 mg Oral BID AC  . simvastatin  20 mg Oral QHS    Continuous Inpatient Infusions:    PRN Inpatient Medications:  acetaminophen **OR** acetaminophen, albuterol, ondansetron **OR** ondansetron (ZOFRAN) IV, polyethylene glycol, traMADol  Family History: family history includes Kidney cancer in her son.   GI Family History: Negative.  Social History:   reports that she quit smoking about 6 years ago. She has never used smokeless tobacco. She reports that she does not drink alcohol or use drugs. The patient denies ETOH, tobacco, or drug use.    Review of Systems: Review of Systems - Negative except As in history of present illness as well as stated below History obtained  from the patient General ROS: positive for  - fatigue and weight gain Psychological ROS: negative ENT ROS: negative Allergy and Immunology ROS: negative Endocrine ROS: positive for - unexpected weight changes Respiratory ROS: positive for - shortness of breath Cardiovascular ROS: no chest pain or dyspnea on exertion Genito-Urinary ROS: negative Musculoskeletal ROS: negative Neurological ROS: no TIA or stroke symptoms Dermatological ROS: negative  Physical Examination: BP (!) 126/49 (BP Location: Left Arm)   Pulse 92   Temp 99.3 F (37.4 C) (Oral)   Resp 20   Ht 5' 4.5" (1.638 m)   Wt 84.8 kg (186 lb 14.4 oz)   SpO2 98%   BMI 31.59 kg/m  Physical Exam  Data: Lab Results  Component Value Date   WBC 6.5 01/29/2018   HGB 7.6 (L) 01/29/2018   HCT 23.9 (L) 01/29/2018   MCV 103.7 (H) 01/29/2018   PLT 235 01/29/2018   Recent Labs  Lab 01/29/18 1549  HGB 7.6*   Lab Results  Component Value Date   NA 139 01/29/2018   K 4.0 01/29/2018   CL 104 01/29/2018   CO2 26 01/29/2018   BUN 26 (H) 01/29/2018   CREATININE 2.02 (H) 01/29/2018   Lab Results  Component  Value Date   ALT 21 01/29/2018   AST 39 01/29/2018   ALKPHOS 67 01/29/2018   BILITOT 0.5 01/29/2018   No results for input(s): APTT, INR, PTT in the last 168 hours. CBC Latest Ref Rng & Units 01/29/2018 05/15/2017 01/08/2017  WBC 3.6 - 11.0 K/uL 6.5 8.0 8.9  Hemoglobin 12.0 - 16.0 g/dL 7.6(L) 11.0(L) 10.5(L)  Hematocrit 35.0 - 47.0 % 23.9(L) 32.7(L) 31.5(L)  Platelets 150 - 440 K/uL 235 196 220    STUDIES: Dg Chest 2 View  Result Date: 01/29/2018 CLINICAL DATA:  Anemia, shortness of breath, fluid retention EXAM: CHEST - 2 VIEW COMPARISON:  02/27/2006 FINDINGS: Postop changes of the right breast and axilla. Mild cardiomegaly with vascular and diffuse interstitial prominence suggesting early interstitial edema versus interstitial lung disease. No significant effusion or pneumothorax. No focal pneumonia, collapse or consolidation. Trachea is midline. Aorta is atherosclerotic. Degenerative changes of the spine with a mild scoliosis. No acute compression fracture or focal kyphosis. IMPRESSION: Cardiomegaly with diffuse interstitial edema pattern versus chronic interstitial lung disease. No significant effusion or focal pneumonia Aortic atherosclerosis Postop changes as above Electronically Signed   By: Jerilynn Mages.  Shick M.D.   On: 01/29/2018 16:31   @IMAGES @  Assessment: 1. Abdominal and peripheral edema-likely secondary to excessiveswelling from portal hypertension and ascites.  2. Cirrhosis-no obvious encephalopathy at this time. Patient admitted from recent and remote medical events appears good.  3. Acute on chronic anemia. Must rule out occult gastrointestinal blood loss. There does not appear to be any acute gastrointestinal blood loss although the cause of this may include portal hypertensive gastropathy or peptic ulcer disease. Variceal bleeding appears less likely in the setting of nonacute, symptomatic anemia  4. History of poorly controlled type 2 diabetes mellitus, insulin-requiring.  5.  Diabetic nephropathy-serum creatinine of 2.00.  6. Hx of EGD and colonoscopy in 2018. Colonoscopy unremarkable for tumors despite fair prep.   Recommendations:  1. Follow hemoglobin level. 2. Agree with empiric acid suppression. 3. Planned abdominal paracentesis, ultrasound-guided. 4. EGD tomorrow.The patient understands the nature of the planned procedure, indications, risks, alternatives and potential complications including but not limited to bleeding, infection, perforation, damage to internal organs and possible oversedation/side effects from anesthesia. The patient agrees and gives consent  to proceed.  Please refer to procedure notes for findings, recommendations and patient disposition/instructions. 5. Check serum ammonia.  Thank you for the consult. Please call with questions or concerns.  Olean Ree, "Lanny Hurst MD [facility] [place], [place] [address] [phone]  01/29/2018 6:42 PM

## 2018-01-29 NOTE — Progress Notes (Signed)
Patient admitted to room 105 from ED with GI bleed. Patient alert oriented x 4, no c/o pain at this time. Patient in bed locked at lower position bedside table, tel;ephone and call light at patient reach. Will continue to monitor patient

## 2018-01-29 NOTE — ED Triage Notes (Signed)
Pt presents to ED from [facility]. Pt states went to her PCP and was told she had a low hemoglobin. Pt c/o increasing SHOB and fluid retention. Pt denies hx of CHF. Pt states SOB started over the last few weeks. Pt denies blood in her stools at this time, however does have hx of hemorrhoids and a few days ago wiped with "a little pink", no significant rectal bleeding noted by patient.

## 2018-01-30 ENCOUNTER — Inpatient Hospital Stay: Payer: Medicare HMO | Admitting: Anesthesiology

## 2018-01-30 ENCOUNTER — Encounter: Payer: Self-pay | Admitting: Anesthesiology

## 2018-01-30 ENCOUNTER — Encounter
Admission: EM | Disposition: A | Discharge status: Home / Self Care | Payer: Self-pay | Source: Ambulatory Visit | Attending: Internal Medicine

## 2018-01-30 HISTORY — PX: ESOPHAGOGASTRODUODENOSCOPY (EGD) WITH PROPOFOL: SHX5813

## 2018-01-30 LAB — PROTIME-INR
INR: 1.29
Prothrombin Time: 16 seconds — ABNORMAL HIGH (ref 11.4–15.2)

## 2018-01-30 LAB — BASIC METABOLIC PANEL
Anion gap: 11 (ref 5–15)
BUN: 27 mg/dL — ABNORMAL HIGH (ref 8–23)
CHLORIDE: 106 mmol/L (ref 98–111)
CO2: 27 mmol/L (ref 22–32)
CREATININE: 1.59 mg/dL — AB (ref 0.44–1.00)
Calcium: 8.9 mg/dL (ref 8.9–10.3)
GFR, EST AFRICAN AMERICAN: 35 mL/min — AB (ref >60)
GFR, EST NON AFRICAN AMERICAN: 30 mL/min — AB (ref >60)
Glucose, Bld: 95 mg/dL (ref 70–99)
POTASSIUM: 4 mmol/L (ref 3.5–5.1)
SODIUM: 144 mmol/L (ref 135–145)

## 2018-01-30 LAB — CBC
HEMATOCRIT: 22.7 % — AB (ref 35.0–47.0)
Hemoglobin: 7.4 g/dL — ABNORMAL LOW (ref 12.0–16.0)
MCH: 33.5 pg (ref 26.0–34.0)
MCHC: 32.7 g/dL (ref 32.0–36.0)
MCV: 102.5 fL — AB (ref 80.0–100.0)
PLATELETS: 205 10*3/uL (ref 150–440)
RBC: 2.22 MIL/uL — AB (ref 3.80–5.20)
RDW: 15.8 % — ABNORMAL HIGH (ref 11.5–14.5)
WBC: 5.9 10*3/uL (ref 3.6–11.0)

## 2018-01-30 LAB — GLUCOSE, CAPILLARY
GLUCOSE-CAPILLARY: 97 mg/dL (ref 70–99)
Glucose-Capillary: 82 mg/dL (ref 70–99)
Glucose-Capillary: 225 mg/dL — ABNORMAL HIGH (ref 70–99)
Glucose-Capillary: 285 mg/dL — ABNORMAL HIGH (ref 70–99)

## 2018-01-30 LAB — HEMOGLOBIN A1C
Hgb A1c MFr Bld: 6.3 % — ABNORMAL HIGH (ref 4.8–5.6)
Mean Plasma Glucose: 134.11 mg/dL

## 2018-01-30 SURGERY — ESOPHAGOGASTRODUODENOSCOPY (EGD) WITH PROPOFOL
Anesthesia: General

## 2018-01-30 SURGERY — ESOPHAGOGASTRODUODENOSCOPY (EGD) WITH PROPOFOL
Anesthesia: Monitor Anesthesia Care

## 2018-01-30 MED ORDER — LIDOCAINE HCL (CARDIAC) PF 100 MG/5ML IV SOSY
PREFILLED_SYRINGE | INTRAVENOUS | Status: DC | PRN
  Administered 2018-01-30: 80 mg via INTRAVENOUS

## 2018-01-30 MED ORDER — SODIUM CHLORIDE 0.9 % IV SOLN
INTRAVENOUS | Status: DC
  Administered 2018-01-30: 1000 mL via INTRAVENOUS

## 2018-01-30 MED ORDER — FENTANYL CITRATE (PF) 100 MCG/2ML IJ SOLN
INTRAMUSCULAR | Status: DC | PRN
  Administered 2018-01-30: 50 ug via INTRAVENOUS
  Administered 2018-01-30: 25 ug via INTRAVENOUS

## 2018-01-30 MED ORDER — PROPOFOL 10 MG/ML IV BOLUS
INTRAVENOUS | Status: DC | PRN
  Administered 2018-01-30: 5 mg via INTRAVENOUS
  Administered 2018-01-30: 100 mg via INTRAVENOUS

## 2018-01-30 NOTE — Plan of Care (Signed)
  Problem: Education: Goal: Ability to identify signs and symptoms of gastrointestinal bleeding will improve Outcome: Progressing   Problem: Bowel/Gastric: Goal: Will show no signs and symptoms of gastrointestinal bleeding Outcome: Progressing   Problem: Fluid Volume: Goal: Will show no signs and symptoms of excessive bleeding Outcome: Progressing   Problem: Clinical Measurements: Goal: Complications related to the disease process, condition or treatment will be avoided or minimized Outcome: Progressing   Problem: Activity: Goal: Risk for activity intolerance will decrease Outcome: Progressing   Problem: Safety: Goal: Ability to remain free from injury will improve Outcome: Progressing

## 2018-01-30 NOTE — Progress Notes (Signed)
[place] [facility] [place] NAME: Emily Faulkner    MR#:  [id number]  DATE OF BIRTH:  [birth year]  SUBJECTIVE:  CHIEF COMPLAINT:   Chief Complaint  Patient presents with  . Shortness of Breath   Lower extremity edema has improved.  Abdominal distention better. No shortness of breath.  Waiting for endoscopy  REVIEW OF SYSTEMS:    Review of Systems  Constitutional: Positive for malaise/fatigue. Negative for chills and fever.  HENT: Negative for sore throat.   Eyes: Negative for blurred vision, double vision and pain.  Respiratory: Negative for cough, hemoptysis, shortness of breath and wheezing.   Cardiovascular: Positive for leg swelling. Negative for chest pain, palpitations and orthopnea.  Gastrointestinal: Negative for abdominal pain, constipation, diarrhea, heartburn, nausea and vomiting.  Genitourinary: Negative for dysuria and hematuria.  Musculoskeletal: Negative for back pain and joint pain.  Skin: Negative for rash.  Neurological: Negative for sensory change, speech change, focal weakness and headaches.  Endo/Heme/Allergies: Does not bruise/bleed easily.  Psychiatric/Behavioral: Negative for depression. The patient is not nervous/anxious.     DRUG ALLERGIES:  No Known Allergies  VITALS:  Blood pressure (!) 120/47, pulse 89, temperature (!) 96.7 F (35.9 C), temperature source Tympanic, resp. rate 20, height 5' 4.5" (1.638 m), weight 84.8 kg (186 lb 14.4 oz), SpO2 97 %.  PHYSICAL EXAMINATION:   Physical Exam  GENERAL:  77 y.o.-year-old patient lying in the bed with no acute distress.  EYES: Pupils equal, round, reactive to light and accommodation. No scleral icterus. Extraocular muscles intact.  HEENT: Head atraumatic, normocephalic. Oropharynx and nasopharynx clear.  NECK:  Supple, no jugular venous distention. No thyroid enlargement, no tenderness.  LUNGS: Normal breath sounds bilaterally, no wheezing, rales, rhonchi. No use of  accessory muscles of respiration.  CARDIOVASCULAR: S1, S2 normal. No murmurs, rubs, or gallops.  ABDOMEN: Soft, nontender, nondistended. Bowel sounds present. No organomegaly or mass.  EXTREMITIES: No cyanosis, clubbing or edema b/l.    NEUROLOGIC: Cranial nerves II through XII are intact. No focal Motor or sensory deficits b/l.   PSYCHIATRIC: The patient is alert and oriented x 3.  SKIN: No obvious rash, lesion, or ulcer.   LABORATORY PANEL:   CBC Recent Labs  Lab 01/30/18 0415  WBC 5.9  HGB 7.4*  HCT 22.7*  PLT 205   ------------------------------------------------------------------------------------------------------------------ Chemistries  Recent Labs  Lab 01/29/18 1549 01/30/18 0415  NA 139 144  K 4.0 4.0  CL 104 106  CO2 26 27  GLUCOSE 130* 95  BUN 26* 27*  CREATININE 2.02* 1.59*  CALCIUM 9.2 8.9  AST 39  --   ALT 21  --   ALKPHOS 67  --   BILITOT 0.5  --    ------------------------------------------------------------------------------------------------------------------  Cardiac Enzymes Recent Labs  Lab 01/29/18 1549  TROPONINI <0.03   ------------------------------------------------------------------------------------------------------------------  RADIOLOGY:  Dg Chest 2 View  Result Date: 01/29/2018 CLINICAL DATA:  Anemia, shortness of breath, fluid retention EXAM: CHEST - 2 VIEW COMPARISON:  02/27/2006 FINDINGS: Postop changes of the right breast and axilla. Mild cardiomegaly with vascular and diffuse interstitial prominence suggesting early interstitial edema versus interstitial lung disease. No significant effusion or pneumothorax. No focal pneumonia, collapse or consolidation. Trachea is midline. Aorta is atherosclerotic. Degenerative changes of the spine with a mild scoliosis. No acute compression fracture or focal kyphosis. IMPRESSION: Cardiomegaly with diffuse interstitial edema pattern versus chronic interstitial lung disease. No significant  effusion or focal pneumonia Aortic atherosclerosis Postop changes as above Electronically  Signed   By: Jerilynn Mages.  Shick M.D.   On: 01/29/2018 16:31     ASSESSMENT AND PLAN:   *Anemia due to chronic GI losses With history of cirrhosis raises concern for variceal bleeding.   Started  Protonix BID  No need for transfusion at this time.  Repeat hemoglobin.  Consult GI. Hold aspirin.  Patient will be on clears and n.p.o. after midnight.  *Cirrhosis with ascites and lower extremity edema.   Started IV lasix.  Monitor input and output.  Replace potassium as needed.   Repeat BMP in the morning. Echocardiogram from earlier in the year reviewed and showed normal ejection fraction.  *Hypertension. Lisinopril held.  *Diabetes mellitus.  Reduced Lantus dose to 24 units.  Sliding scale insulin added.  *DVT prophylaxis with SCDs    All the records are reviewed and case discussed with Care Management/Social Worker Management plans discussed with the patient, family and they are in agreement.  CODE STATUS: FULL CODE  TOTAL TIME TAKING CARE OF THIS PATIENT: 35 minutes.   POSSIBLE D/C IN 1-2 DAYS, DEPENDING ON CLINICAL CONDITION.  Leia Alf Gavon Majano M.D on 01/30/2018 at 12:31 PM  Between 7am to 6pm - Pager - [phone]  After 6pm go to [email or web address] - password [email or web address] [facility]  Office  [phone]  CC: Primary care physician; Sofie Hartigan, MD  Note: This dictation was prepared with Dragon dictation along with smaller phrase technology. Any transcriptional errors that result from this process are unintentional.

## 2018-01-30 NOTE — Anesthesia Preprocedure Evaluation (Signed)
Anesthesia Evaluation  Patient identified by MRN, date of birth, ID band Patient awake    Reviewed: Allergy & Precautions, H&P , NPO status , Patient's Chart, lab work & pertinent test results  History of Anesthesia Complications Negative for: history of anesthetic complications  Airway Mallampati: III  TM Distance: <3 FB Neck ROM: limited    Dental  (+) Chipped, Poor Dentition, Missing, Upper Dentures   Pulmonary shortness of breath and with exertion, former smoker,           Cardiovascular Exercise Tolerance: Good hypertension, (-) angina+ Peripheral Vascular Disease  (-) Past MI and (-) DOE      Neuro/Psych negative neurological ROS  negative psych ROS   GI/Hepatic negative GI ROS, Neg liver ROS,   Endo/Other  diabetes, Type 2, Insulin Dependent  Renal/GU Renal disease  negative genitourinary   Musculoskeletal  (+) Arthritis ,   Abdominal   Peds  Hematology negative hematology ROS (+) anemia ,   Anesthesia Other Findings Patient is NPO appropriate and reports no nausea or vomiting today.   Past Medical History: No date: Cancer [facility])     Comment:  breast cancer right mastectomy No date: Diabetes mellitus without complication ([facility]) No date: Glaucoma No date: Hyperlipidemia No date: Hypertension No date: Neuropathy No date: Osteoarthritis No date: Rheumatoid arthritis ([place]) No date: Telangiectasias  Past Surgical History: No date: BREAST SURGERY     Comment:  right mastectomy No date: CHOLECYSTECTOMY No date: COLONOSCOPY 08/14/2016: COLONOSCOPY WITH PROPOFOL; N/A     Comment:  Procedure: COLONOSCOPY WITH PROPOFOL;  Surgeon: Lollie Sails, MD;  Location: [facility] ENDOSCOPY;  Service:               Endoscopy;  Laterality: N/A; 08/14/2016: ESOPHAGOGASTRODUODENOSCOPY (EGD) WITH PROPOFOL; N/A     Comment:  Procedure: ESOPHAGOGASTRODUODENOSCOPY (EGD) WITH               PROPOFOL;  Surgeon: Lollie Sails, MD;  Location:               [facility] ENDOSCOPY;  Service: Endoscopy;  Laterality: N/A; No date: EXCISIONAL HEMORRHOIDECTOMY  BMI    Body Mass Index:  31.59 kg/m      Reproductive/Obstetrics negative OB ROS                             Anesthesia Physical Anesthesia Plan  ASA: III  Anesthesia Plan: General   Post-op Pain Management:    Induction: Intravenous  PONV Risk Score and Plan: Propofol infusion and TIVA  Airway Management Planned: Natural Airway and Nasal Cannula  Additional Equipment:   Intra-op Plan:   Post-operative Plan:   Informed Consent: I have reviewed the patients History and Physical, chart, labs and discussed the procedure including the risks, benefits and alternatives for the proposed anesthesia with the patient or authorized representative who has indicated his/her understanding and acceptance.   Dental Advisory Given  Plan Discussed with: Anesthesiologist, CRNA and Surgeon  Anesthesia Plan Comments: (Patient consented for risks of anesthesia including but not limited to:  - adverse reactions to medications - risk of intubation if required - damage to teeth, lips or other oral mucosa - sore throat or hoarseness - Damage to heart, brain, lungs or loss of life  Patient voiced understanding.)        Anesthesia Quick Evaluation

## 2018-01-30 NOTE — Op Note (Signed)
[facility] Gastroenterology Patient Name: Emily Faulkner Procedure Date: 01/30/2018 12:25 PM MRN: [id number] Account #: [id number] Date of Birth: 03-Mar-1941 Admit Type: Inpatient Age: 77 Room: [facility] ENDO ROOM 2 Gender: Female Note Status: Finalized Procedure:            Upper GI endoscopy Indications:          Iron deficiency anemia secondary to chronic blood loss Providers:            Benay Pike. Alice Reichert MD, MD Referring MD:         Sofie Hartigan (Referring MD) Medicines:            Propofol per Anesthesia Complications:        No immediate complications. Procedure:            Pre-Anesthesia Assessment:                       - The risks and benefits of the procedure and the                        sedation options and risks were discussed with the                        patient. All questions were answered and informed                        consent was obtained.                       - Patient identification and proposed procedure were                        verified prior to the procedure by the nurse. The                        procedure was verified in the procedure room.                       - ASA Grade Assessment: III - A patient with severe                        systemic disease.                       - After reviewing the risks and benefits, the patient                        was deemed in satisfactory condition to undergo the                        procedure.                       After obtaining informed consent, the endoscope was                        passed under direct vision. Throughout the procedure,                        the patient's blood pressure, pulse, and oxygen  saturations were monitored continuously. The Endoscope                        was introduced through the mouth, and advanced to the                        third part of duodenum. The upper GI endoscopy was                        accomplished without  difficulty. The patient tolerated                        the procedure well. Findings:      A widely patent and non-obstructing Schatzki ring was found in the lower       third of the esophagus.      There is no endoscopic evidence of bleeding, areas of erosion,       inflammation or varices in the distal esophagus.      Mild portal hypertensive gastropathy was found in the entire examined       stomach.      There is no endoscopic evidence of bleeding, ulceration or varices in       the stomach.      The examined duodenum was normal.      The exam was otherwise without abnormality. Impression:           - Widely patent and non-obstructing Schatzki ring.                       - Portal hypertensive gastropathy.                       - Normal examined duodenum.                       - The examination was otherwise normal.                       - No specimens collected. Recommendation:       - Return patient to hospital ward for possible                        discharge same day.                       - Advance diet as tolerated.                       - No aspirin, ibuprofen, naproxen, or other                        non-steroidal anti-inflammatory drugs. Procedure Code(s):    --- Professional ---                       [phone], Esophagogastroduodenoscopy, flexible, transoral;                        diagnostic, including collection of specimen(s) by                        brushing or washing, when performed (separate procedure) Diagnosis Code(s):    --- Professional ---  D50.0, Iron deficiency anemia secondary to blood loss                        (chronic)                       K31.89, Other diseases of stomach and duodenum                       K76.6, Portal hypertension                       K22.2, Esophageal obstruction CPT copyright 2017 American Medical Association. All rights reserved. The codes documented in this report are preliminary and upon coder review may   be revised to meet current compliance requirements. Efrain Sella MD, MD 01/30/2018 12:51:31 PM This report has been signed electronically. Number of Addenda: 0 Note Initiated On: 01/30/2018 12:25 PM      [facility]

## 2018-01-30 NOTE — Anesthesia Postprocedure Evaluation (Signed)
Anesthesia Post Note  Patient: CATE ORAVEC  Procedure(s) Performed: ESOPHAGOGASTRODUODENOSCOPY (EGD) WITH PROPOFOL (N/A )  Patient location during evaluation: Endoscopy Anesthesia Type: General Level of consciousness: awake and alert Pain management: pain level controlled Vital Signs Assessment: post-procedure vital signs reviewed and stable Respiratory status: spontaneous breathing, nonlabored ventilation, respiratory function stable and patient connected to nasal cannula oxygen Cardiovascular status: blood pressure returned to baseline and stable Postop Assessment: no apparent nausea or vomiting Anesthetic complications: no     Last Vitals:  Vitals:   01/30/18 1319 01/30/18 1324  BP: (!) 106/50 (!) 111/53  Pulse:  88  Resp:  17  Temp:    SpO2:  100%    Last Pain:  Vitals:   01/30/18 1319  TempSrc:   PainSc: 0-No pain                 Precious Haws Denisha Hoel

## 2018-01-30 NOTE — Transfer of Care (Signed)
Immediate Anesthesia Transfer of Care Note  Patient: Emily Faulkner  Procedure(s) Performed: ESOPHAGOGASTRODUODENOSCOPY (EGD) WITH PROPOFOL (N/A )  Patient Location: PACU  Anesthesia Type:General  Level of Consciousness: sedated  Airway & Oxygen Therapy: Patient Spontanous Breathing and Patient connected to nasal cannula oxygen  Post-op Assessment: Report given to RN and Post -op Vital signs reviewed and stable  Post vital signs: Reviewed and unstable  Last Vitals:  Vitals Value Taken Time  BP 102/48 01/30/2018 12:55 PM  Temp 36.1 C 01/30/2018 12:55 PM  Pulse 81 01/30/2018 12:56 PM  Resp 14 01/30/2018 12:56 PM  SpO2 97 % 01/30/2018 12:56 PM  Vitals shown include unvalidated device data.  Last Pain:  Vitals:   01/30/18 1220  TempSrc: Tympanic  PainSc:          Complications: No apparent anesthesia complications

## 2018-01-30 NOTE — Anesthesia Post-op Follow-up Note (Signed)
Anesthesia QCDR form completed.        

## 2018-01-30 NOTE — Interval H&P Note (Signed)
History and Physical Interval Note:  01/30/2018 12:30 PM  Emily Faulkner  has presented today for surgery, with the diagnosis of ANEMIA MELENA  The various methods of treatment have been discussed with the patient and family. After consideration of risks, benefits and other options for treatment, the patient has consented to  Procedure(s): ESOPHAGOGASTRODUODENOSCOPY (EGD) WITH PROPOFOL (N/A) as a surgical intervention .  The patient's history has been reviewed, patient examined, no change in status, stable for surgery.  I have reviewed the patient's chart and labs.  Questions were answered to the patient's satisfaction.     [place], [place]

## 2018-01-31 LAB — CBC WITH DIFFERENTIAL/PLATELET
BASOS ABS: 0 10*3/uL (ref 0–0.1)
BASOS PCT: 1 %
Eosinophils Absolute: 0.6 10*3/uL (ref 0–0.7)
Eosinophils Relative: 11 %
HEMATOCRIT: 23.1 % — AB (ref 35.0–47.0)
HEMOGLOBIN: 7.5 g/dL — AB (ref 12.0–16.0)
Lymphocytes Relative: 19 %
Lymphs Abs: 1 10*3/uL (ref 1.0–3.6)
MCH: 33 pg (ref 26.0–34.0)
MCHC: 32.5 g/dL (ref 32.0–36.0)
MCV: 101.5 fL — ABNORMAL HIGH (ref 80.0–100.0)
MONOS PCT: 9 %
Monocytes Absolute: 0.5 10*3/uL (ref 0.2–0.9)
NEUTROS PCT: 60 %
Neutro Abs: 3.3 10*3/uL (ref 1.4–6.5)
Platelets: 206 10*3/uL (ref 150–440)
RBC: 2.28 MIL/uL — AB (ref 3.80–5.20)
RDW: 15.8 % — ABNORMAL HIGH (ref 11.5–14.5)
WBC: 5.4 10*3/uL (ref 3.6–11.0)

## 2018-01-31 LAB — BASIC METABOLIC PANEL
ANION GAP: 8 (ref 5–15)
BUN: 29 mg/dL — ABNORMAL HIGH (ref 8–23)
CHLORIDE: 104 mmol/L (ref 98–111)
CO2: 30 mmol/L (ref 22–32)
CREATININE: 1.78 mg/dL — AB (ref 0.44–1.00)
Calcium: 8.9 mg/dL (ref 8.9–10.3)
GFR calc non Af Amer: 27 mL/min — ABNORMAL LOW (ref >60)
GFR, EST AFRICAN AMERICAN: 31 mL/min — AB (ref >60)
Glucose, Bld: 145 mg/dL — ABNORMAL HIGH (ref 70–99)
POTASSIUM: 4 mmol/L (ref 3.5–5.1)
SODIUM: 142 mmol/L (ref 135–145)

## 2018-01-31 LAB — GLUCOSE, CAPILLARY
GLUCOSE-CAPILLARY: 145 mg/dL — AB (ref 70–99)
GLUCOSE-CAPILLARY: 349 mg/dL — AB (ref 70–99)

## 2018-01-31 MED ORDER — PANTOPRAZOLE SODIUM 40 MG PO TBEC: 40.0000 mg | DELAYED_RELEASE_TABLET | Freq: Two times a day (BID) | ORAL | 0 refills | Status: DC

## 2018-01-31 MED ORDER — FERROUS FUMARATE 325 (106 FE) MG PO TABS: 1.0000 | ORAL_TABLET | Freq: Two times a day (BID) | ORAL | 0 refills | Status: DC

## 2018-01-31 MED ORDER — FUROSEMIDE 40 MG PO TABS: 40.0000 mg | ORAL_TABLET | Freq: Two times a day (BID) | ORAL | 0 refills | Status: DC

## 2018-01-31 NOTE — Discharge Instructions (Signed)
Low sodium diet  Activity as tolerated

## 2018-01-31 NOTE — Progress Notes (Signed)
Inpatient Diabetes Program Recommendations  AACE/ADA: New Consensus Statement on Inpatient Glycemic Control (2019)  Target Ranges:  Prepandial:   less than 140 mg/dL      Peak postprandial:   less than 180 mg/dL (1-2 hours)      Critically ill patients:  140 - 180 mg/dL   Results for Emily Faulkner, Emily Faulkner (MRN [id number]) as of 01/31/2018 10:17  Ref. Range 01/30/2018 07:36 01/30/2018 11:53 01/30/2018 16:34 01/30/2018 21:09 01/31/2018 07:46  Glucose-Capillary Latest Ref Range: 70 - 99 mg/dL 82 97 225 (H) 285 (H) 145 (H)  Results for Emily Faulkner, Emily Faulkner (MRN [id number]) as of 01/31/2018 10:17  Ref. Range 01/29/2018 15:49  Hemoglobin A1C Latest Ref Range: 4.8 - 5.6 % 6.3 (H)   Review of Glycemic Control  Diabetes history: DM2 Outpatient Diabetes medications: Glipizide 10 mg daily, Lantus 36 units daily, Januvia 50 mg daily Current orders for Inpatient glycemic control: Lantus 24 units QAM, Novolog 0-9 units TID with meals, Novolog 0-5 units QHS  Inpatient Diabetes Program Recommendations:  Insulin - Basal: No Lantus has been given since admitted and fasting glucose 145 mg/dl this morning. Please discontinue Lantus while inpatient.   NOTE: Talked with Stanton Kidney, RN regarding Lantus and asked that she not give yet as recommendation to discontinue while inpatient will be sent to MD by text page. Patient will be discharged home today but still recommend not to give Lantus this morning.  Thanks, Barnie Alderman, RN, MSN, CDE Diabetes Coordinator Inpatient Diabetes Program [phone] (Team Pager from 8am to 5pm)

## 2018-01-31 NOTE — Care Management Important Message (Signed)
Important Message  Patient Details  Name: Emily Faulkner MRN: [id number] Date of Birth: 06/07/1941   Medicare Important Message Given:  Yes    Juliann Pulse A Trana Ressler 01/31/2018, 11:14 AM

## 2018-01-31 NOTE — Plan of Care (Signed)
Pt is d/ced home. Upper endoscopy was clear.  Hgb is stable although 7.5.  Lasix was not given this am, nor was lantus.  Pt will start back taking home dose of lantus.  Didn't give today b/c she had been NPO for EGD.  Pt is A&O, no c/o pain.  Daughter will pick her up at lunch time.  D/c instructions reviewed and IV removed.

## 2018-02-03 ENCOUNTER — Encounter: Payer: Self-pay | Admitting: Internal Medicine

## 2018-02-05 ENCOUNTER — Ambulatory Visit
Admission: RE | Admit: 2018-02-05 | Discharge: 2018-02-05 | Disposition: A | Discharge status: Home / Self Care | Payer: Medicare HMO | Source: Ambulatory Visit | Attending: Urology | Admitting: Urology

## 2018-02-05 DIAGNOSIS — N2889 Other specified disorders of kidney and ureter: Secondary | ICD-10-CM | POA: Insufficient documentation

## 2018-02-07 ENCOUNTER — Encounter: Payer: Self-pay | Admitting: Urology

## 2018-02-07 ENCOUNTER — Ambulatory Visit (INDEPENDENT_AMBULATORY_CARE_PROVIDER_SITE_OTHER): Payer: Medicare HMO | Admitting: Urology

## 2018-02-07 VITALS — BP 126/56 | HR 98 | Ht 64.0 in | Wt 188.0 lb

## 2018-02-07 DIAGNOSIS — Z794 Long term (current) use of insulin: Secondary | ICD-10-CM | POA: Diagnosis not present

## 2018-02-07 DIAGNOSIS — N184 Chronic kidney disease, stage 4 (severe): Secondary | ICD-10-CM | POA: Diagnosis not present

## 2018-02-07 DIAGNOSIS — K7460 Unspecified cirrhosis of liver: Secondary | ICD-10-CM | POA: Diagnosis not present

## 2018-02-07 DIAGNOSIS — R700 Elevated erythrocyte sedimentation rate: Secondary | ICD-10-CM | POA: Diagnosis not present

## 2018-02-07 DIAGNOSIS — N183 Chronic kidney disease, stage 3 unspecified: Secondary | ICD-10-CM

## 2018-02-07 DIAGNOSIS — R7989 Other specified abnormal findings of blood chemistry: Secondary | ICD-10-CM | POA: Diagnosis not present

## 2018-02-07 DIAGNOSIS — D508 Other iron deficiency anemias: Secondary | ICD-10-CM | POA: Diagnosis not present

## 2018-02-07 DIAGNOSIS — N2889 Other specified disorders of kidney and ureter: Secondary | ICD-10-CM

## 2018-02-07 DIAGNOSIS — I10 Essential (primary) hypertension: Secondary | ICD-10-CM | POA: Diagnosis not present

## 2018-02-07 DIAGNOSIS — R161 Splenomegaly, not elsewhere classified: Secondary | ICD-10-CM

## 2018-02-07 DIAGNOSIS — E1140 Type 2 diabetes mellitus with diabetic neuropathy, unspecified: Secondary | ICD-10-CM | POA: Diagnosis not present

## 2018-02-07 NOTE — Progress Notes (Signed)
02/07/2018 3:50 PM   [name] [birth year] [id number]  Referring provider: Sofie Hartigan, MD [address], [place] [address]  Chief Complaint  Patient presents with  . Renal Mass    HPI: 77 yo F with incidental renal mass returns today for 45-monthinterval imaging with renal ultrasound.  She was initially seen in 10/2016 at which time a 1.7 x 1.6 cm enhancing interpolar left renal mass was identified.  She underwent follow-up CT abdomen with and without contrast on 01/30/2017 which showed the lesion had enlarged slightly to 1.9 x 1.8 cm which is most consistent with a Bosniak 3 lesion, primarily cystic with thick enhancing  wall.  Most recent renal imaging in the form of renal ultrasound on 02/05/2018 shows mild interval increase in the lesion, now measuring 2 x 1.5 x 1.8 cm which has a more solid appearance than previous.  This is in comparison to a renal ultrasound on from 07/2017 at which time the lesion measured 1.3 x 1.2 x 1.4 cm described as s complex exophytic midpole partially cystic structure on the kidney.  This was not appreciated on previous scan on 11/2012.  No dysuria or gross hematuria.    She continues to be asymptomatic.  Her son was dx with kidney cancer s/p nephrectomy.  Remote history of breast cancer 1995 s/p mastectomy, chemo and tamoxifen therapy x 5 years, NED.     PMH: Past Medical History:  Diagnosis Date  . Cancer ([facility]    breast cancer right mastectomy  . [place] complication ([place]   . Glaucoma   . Hyperlipidemia   . Hypertension   . Neuropathy   . Osteoarthritis   . Rheumatoid arthritis ([place]   . Telangiectasias     Surgical History: Past Surgical History:  Procedure Laterality Date  . BREAST SURGERY     right mastectomy  . CHOLECYSTECTOMY    . COLONOSCOPY    . COLONOSCOPY WITH PROPOFOL N/A 08/14/2016   Procedure: COLONOSCOPY WITH PROPOFOL;  Surgeon: MLollie Sails MD;  [facility]   Service: Endoscopy;  Laterality: N/A;  . ESOPHAGOGASTRODUODENOSCOPY (EGD) WITH PROPOFOL N/A 08/14/2016   Procedure: ESOPHAGOGASTRODUODENOSCOPY (EGD) WITH PROPOFOL;  Surgeon: MLollie Sails MD;  [facility];  Service: Endoscopy;  Laterality: N/A;  . ESOPHAGOGASTRODUODENOSCOPY (EGD) WITH PROPOFOL N/A 01/30/2018   Procedure: ESOPHAGOGASTRODUODENOSCOPY (EGD) WITH PROPOFOL;  Surgeon: [place], TBenay Pike MD;  Location: [facility] ENDOSCOPY;  Service: Gastroenterology;  Laterality: N/A;  . EXCISIONAL HEMORRHOIDECTOMY      Home Medications:  Allergies as of 02/07/2018   No Known Allergies     Medication List        Accurate as of 02/07/18 11:59 PM. Always use your most recent med list.          ACCU-CHEK AVIVA PLUS test strip Generic drug:  glucose blood   aspirin 81 MG EC tablet Take 81 mg by mouth daily.   clindamycin 1 % external solution Commonly known as:  CLEOCIN T Apply 1 application topically 2 (two) times daily.   clobetasol 0.05 % external solution Commonly known as:  TEMOVATE Apply 1 application topically every evening. (apply to scalp)   ferrous fumarate 325 (106 Fe) MG Tabs tablet Commonly known as:  HEMOCYTE - 106 mg FE Take 1 tablet (106 mg of iron total) by mouth 2 (two) times daily.   Fish Oil 1000 MG Caps Take 1 capsule by mouth.   furosemide 40 MG tablet Commonly known as:  LASIX Take 1 tablet (40 mg total) by mouth 2 (two) times daily.   gabapentin 300 MG capsule Commonly known as:  NEURONTIN Take 600 mg by mouth 3 (three) times daily.   glipiZIDE 10 MG tablet Commonly known as:  GLUCOTROL Take 10 mg by mouth daily.   hydrOXYzine 10 MG tablet Commonly known as:  ATARAX/VISTARIL Take 10 mg by mouth at bedtime.   ketoconazole 2 % shampoo Commonly known as:  NIZORAL Apply 1 application topically daily. (apply to scalp and wait at least 30 minutes before rinsing)   LANTUS SOLOSTAR 100 UNIT/ML Solostar Pen Generic drug:  Insulin  Glargine Inject 36 Units into the skin daily. (may take up to 50u based upon blood glucose reading)   lisinopril 2.5 MG tablet Commonly known as:  PRINIVIL,ZESTRIL Take 2.5 mg by mouth daily.   loratadine 10 MG tablet Commonly known as:  CLARITIN Take 10 mg by mouth daily.   Magnesium Oxide 400 (240 Mg) MG Tabs TAKE 2 TABLETS (800 MG TOTAL) BY MOUTH DAILY.   magnesium oxide 400 MG tablet Commonly known as:  MAG-OX Take 800 mg by mouth daily.   niacin 500 MG CR tablet Commonly known as:  NIASPAN Take 500 mg by mouth every evening.   pantoprazole 40 MG tablet Commonly known as:  PROTONIX Take 1 tablet (40 mg total) by mouth 2 (two) times daily before a meal.   simvastatin 20 MG tablet Commonly known as:  ZOCOR Take 20 mg by mouth at bedtime.   sitaGLIPtin 50 MG tablet Commonly known as:  JANUVIA Take 50 mg by mouth daily.   TRAVATAN Z 0.004 % Soln ophthalmic solution Generic drug:  Travoprost (BAK Free) Place 1 drop into the left eye at bedtime.   vitamin B-12 1000 MCG tablet Commonly known as:  CYANOCOBALAMIN Take 1,000 mcg by mouth daily.   Vitamin D2 2000 units Tabs Take 1 tablet by mouth daily.       Allergies: No Known Allergies  Family History: Family History  Problem Relation Age of Onset  . Kidney cancer Son   . Bladder Cancer Neg Hx     Social History:  reports that she quit smoking about 6 years ago. She has never used smokeless tobacco. She reports that she does not drink alcohol or use drugs.  ROS: UROLOGY Frequent Urination?: No Hard to postpone urination?: No Burning/pain with urination?: No Get up at night to urinate?: No Leakage of urine?: No Urine stream starts and stops?: Yes Trouble starting stream?: No Do you have to strain to urinate?: No Blood in urine?: No Urinary tract infection?: No Sexually transmitted disease?: No Injury to kidneys or bladder?: No Painful intercourse?: No Weak stream?: No Currently pregnant?:  No Vaginal bleeding?: No Last menstrual period?: Postmenopausal  Gastrointestinal Nausea?: No Vomiting?: No Indigestion/heartburn?: No Diarrhea?: Yes Constipation?: No  Constitutional Fever: No Night sweats?: No Weight loss?: No Fatigue?: Yes  Skin Skin rash/lesions?: No Itching?: Yes  Eyes Blurred vision?: No Double vision?: No  Ears/Nose/Throat Sore throat?: No Sinus problems?: No  Hematologic/Lymphatic Swollen glands?: No Easy bruising?: Yes  Cardiovascular Leg swelling?: No Chest pain?: No  Respiratory Cough?: No Shortness of breath?: No  Endocrine Excessive thirst?: No  Musculoskeletal Back pain?: Yes Joint pain?: No  Neurological Headaches?: No Dizziness?: No  Psychologic Depression?: No Anxiety?: No  Physical Exam: BP (!) 126/56 (BP Location: Left Arm, Patient Position: Sitting, Cuff Size: Large)   Pulse 98   Ht 5' 4"  (1.626 m)  Wt 188 lb (85.3 kg)   BMI 32.27 kg/m   Constitutional:  Alert and oriented, No acute distress. HEENT: [place] AT, moist mucus membranes.  Trachea midline, no masses. Cardiovascular: No clubbing, cyanosis, or edema. Respiratory: Normal respiratory effort, no increased work of breathing.. Skin: No rashes, bruises or suspicious lesions. Neurologic: Grossly intact, no focal deficits, moving all 4 extremities. Psychiatric: Normal mood and affect.  Laboratory Data: Lab Results  Component Value Date   WBC 5.4 01/31/2018   HGB 7.5 (L) 01/31/2018   HCT 23.1 (L) 01/31/2018   MCV 101.5 (H) 01/31/2018   PLT 206 01/31/2018    Lab Results  Component Value Date   CREATININE 1.78 (H) 01/31/2018     Lab Results  Component Value Date   HGBA1C 6.3 (H) 01/29/2018    Urinalysis N/a  Pertinent Imaging: Results for orders placed during the hospital encounter of 02/05/18  US RENAL   Addendum ADDENDUM REPORT: 02/05/2018 17:49  ADDENDUM: Incidentally noted is marked splenomegaly with splenic volume of 682.1 cubic cm.  Previous volume on 07/30/2017 was 619 cc.   Electronically Signed   By: Donavan Foil M.D.   On: 02/05/2018 17:49       Madie Reno, MD 02/05/2018  5:51 PM         Narrative CLINICAL DATA:  Follow-up renal mass  EXAM: RENAL / URINARY TRACT ULTRASOUND COMPLETE  COMPARISON:  Ultrasound 07/30/2017, CT 01/30/2017, 09/27/2016  FINDINGS: Right Kidney:  Length: 9.9 cm. Cortical thinning is present. No hydronephrosis or focal abnormality.  Left Kidney:  Length: 8.6 cm. Cortical thinning is present. No hydronephrosis. Hypoechoic mass in the mid to upper pole of the left kidney measuring 2 x 1.5 x 1.8 cm. Previous ultrasound measurements of 1.3 x 1.2 x 1.4 cm. Lesion now appears solid.  Bladder:  Appears normal for degree of bladder distention.  IMPRESSION: 1. Increased size of the mid to upper pole mass lesion in the left kidney which now appears solid as opposed to complex cystic. Findings are concerning for renal carcinoma. Follow-up renal protocol CT or MRI is advised.  These results will be called to the ordering clinician or representative by the Radiologist Assistant, and communication documented in the PACS or zVision Dashboard.  Electronically Signed: By: Donavan Foil M.D. On: 02/05/2018 15:31    Renal ultrasound was personally reviewed today.  This was also directly compared to previous renal ultrasound in 07/30/2017.  Assessment & Plan:    1. Left renal mass Incidental left upper pole renal mass, initially felt to be cystic but now has more solid components based on most recent imaging Lesion appears to be enlarging I recommended repeating CT abdomen with and without contrast for further characterization of the lesion as well as comparison to her initial CT scan  2. Splenomegaly New splenomegaly of unclear etiology We will assess further CT scan - CT Abd Wo & W Cm; Future  3. CKD (chronic kidney disease) stage 3, GFR 30-59 ml/min ([place]) Plan for recheck of  creatinine prior to administration of IV contrast   Return in about 1 month (around 03/07/2018) for f/u CT scan.  Hollice Espy, MD  [facility] [address] [place], [place] [address] [phone]

## 2018-02-07 NOTE — Discharge Summary (Signed)
Emily Faulkner at [facility] NAME: Emily Faulkner    MR#:  [id number]  DATE OF BIRTH:  September 15, 1940  DATE OF ADMISSION:  01/29/2018 ADMITTING PHYSICIAN: Hillary Bow, MD  DATE OF DISCHARGE: 01/31/2018 12:30 PM  PRIMARY CARE PHYSICIAN: Sofie Hartigan, MD   ADMISSION DIAGNOSIS:  Gastrointestinal hemorrhage, unspecified gastrointestinal hemorrhage type [K92.2]  DISCHARGE DIAGNOSIS:  Active Problems:   Anemia   SECONDARY DIAGNOSIS:   Past Medical History:  Diagnosis Date  . Cancer [facility])    breast cancer right mastectomy  . Diabetes mellitus without complication ([place])   . Glaucoma   . Hyperlipidemia   . Hypertension   . Neuropathy   . Osteoarthritis   . Rheumatoid arthritis ([place])   . Telangiectasias      ADMITTING HISTORY  HISTORY OF PRESENT ILLNESS:  Emily Faulkner  is a 77 y.o. female with a known history of cirrhosis, hypertension, diabetes mellitus presents to the hospital sent in from the [facility] GI office due to anemia and heme positive stool.  Patient has also noticed abdominal distention and increasing bilateral lower extremity edema.  Hemoglobin is 7.6.  Patient does not have any melena.  Takes baby aspirin daily.  No NSAIDs.  Had EGD many years back but does not remember the results.  HOSPITAL COURSE:    *Anemia due to chronic due to GI losses With history of cirrhosis raises concern for variceal bleeding.  Started  Protonix BID No need for transfusion at this time.  GI consulted. EGD showed mild portal gastropathy. No acute bleeding Started on PPIs at discharge.  Iron supplements.  *Cirrhosis with ascites and lower extremity edema.  Started IV lasix and diuresed well Echocardiogram from earlier in the year reviewed and showed normal ejection fraction. Discharged home on oral Lasix  *Hypertension. Lisinopril held.  *Diabetes mellitus. Patient was on reduced dose of Lantus through the hospital stay.  Resume  home dose at discharge  *DVT prophylaxis withSCDs   Stable for discharge home CONSULTS OBTAINED:  Treatment Team:  Efrain Sella, MD Hillary Bow, MD  DRUG ALLERGIES:  No Known Allergies  DISCHARGE MEDICATIONS:   Allergies as of 01/31/2018   No Known Allergies     Medication List    STOP taking these medications   metFORMIN 850 MG tablet Commonly known as:  GLUCOPHAGE   RABEprazole 20 MG tablet Commonly known as:  ACIPHEX     TAKE these medications   aspirin 81 MG EC tablet Take 81 mg by mouth daily.   clindamycin 1 % external solution Commonly known as:  CLEOCIN T Apply 1 application topically 2 (two) times daily.   clobetasol 0.05 % external solution Commonly known as:  TEMOVATE Apply 1 application topically every evening. (apply to scalp)   ferrous fumarate 325 (106 Fe) MG Tabs tablet Commonly known as:  HEMOCYTE - 106 mg FE Take 1 tablet (106 mg of iron total) by mouth 2 (two) times daily. What changed:  when to take this   Fish Oil 1000 MG Caps Take 1 capsule by mouth.   furosemide 40 MG tablet Commonly known as:  LASIX Take 1 tablet (40 mg total) by mouth 2 (two) times daily. What changed:  when to take this   gabapentin 300 MG capsule Commonly known as:  NEURONTIN Take 600 mg by mouth 3 (three) times daily.   glipiZIDE 10 MG tablet Commonly known as:  GLUCOTROL Take 10 mg by mouth daily.   hydrOXYzine  10 MG tablet Commonly known as:  ATARAX/VISTARIL Take 10 mg by mouth at bedtime.   ketoconazole 2 % shampoo Commonly known as:  NIZORAL Apply 1 application topically daily. (apply to scalp and wait at least 30 minutes before rinsing)   LANTUS SOLOSTAR 100 UNIT/ML Solostar Pen Generic drug:  Insulin Glargine Inject 36 Units into the skin daily. (may take up to 50u based upon blood glucose reading)   lisinopril 2.5 MG tablet Commonly known as:  PRINIVIL,ZESTRIL Take 2.5 mg by mouth daily.   loratadine 10 MG tablet Commonly known  as:  CLARITIN Take 10 mg by mouth daily.   magnesium oxide 400 MG tablet Commonly known as:  MAG-OX Take 800 mg by mouth daily.   niacin 500 MG CR tablet Commonly known as:  NIASPAN Take 500 mg by mouth every evening.   pantoprazole 40 MG tablet Commonly known as:  PROTONIX Take 1 tablet (40 mg total) by mouth 2 (two) times daily before a meal.   simvastatin 20 MG tablet Commonly known as:  ZOCOR Take 20 mg by mouth at bedtime.   sitaGLIPtin 50 MG tablet Commonly known as:  JANUVIA Take 50 mg by mouth daily.   TRAVATAN Z 0.004 % Soln ophthalmic solution Generic drug:  Travoprost (BAK Free) Place 1 drop into the left eye at bedtime.   vitamin B-12 1000 MCG tablet Commonly known as:  CYANOCOBALAMIN Take 1,000 mcg by mouth daily.   Vitamin D2 2000 units Tabs Take 1 tablet by mouth daily.       Today   VITAL SIGNS:  Blood pressure 99/60, pulse (!) 126, temperature 97.9 F (36.6 C), temperature source Oral, resp. rate (!) 23, height 5' 4.5" (1.638 m), weight 80.9 kg (178 lb 4.8 oz), SpO2 93 %.  I/O:  No intake or output data in the 24 hours ending 02/07/18 1451  PHYSICAL EXAMINATION:  Physical Exam  GENERAL:  77 y.o.-year-old patient lying in the bed with no acute distress.  LUNGS: Normal breath sounds bilaterally, no wheezing, rales,rhonchi or crepitation. No use of accessory muscles of respiration.  CARDIOVASCULAR: S1, S2 normal. No murmurs, rubs, or gallops.  ABDOMEN: Soft, non-tender, non-distended. Bowel sounds present. No organomegaly or mass.  NEUROLOGIC: Moves all 4 extremities. PSYCHIATRIC: The patient is alert and oriented x 3.  SKIN: No obvious rash, lesion, or ulcer.   DATA REVIEW:   CBC No results for input(s): WBC, HGB, HCT, PLT in the last 168 hours.  Chemistries  No results for input(s): NA, K, CL, CO2, GLUCOSE, BUN, CREATININE, CALCIUM, MG, AST, ALT, ALKPHOS, BILITOT in the last 168 hours.  Invalid input(s): GFRCGP  Cardiac Enzymes No  results for input(s): TROPONINI in the last 168 hours.  Microbiology Results  Results for orders placed or performed during the hospital encounter of 08/14/16  KOH prep     Status: None   Collection Time: 08/14/16  9:40 AM  Result Value Ref Range Status   Specimen Description BRONCHIAL BRUSHING  Final   Special Requests NONE  Final   KOH Prep NO YEAST OR FUNGAL ELEMENTS SEEN  Final   Report Status 08/14/2016 FINAL  Final    RADIOLOGY:  No results found.  Follow up with PCP in 1 week.  Management plans discussed with the patient, family and they are in agreement.  CODE STATUS:  Code Status History    Date Active Date Inactive Code Status Order ID Comments User Context   01/29/2018 1648 01/31/2018 1535 Full Code [id number]  Hillary Bow, MD ED      TOTAL TIME TAKING CARE OF THIS PATIENT ON DAY OF DISCHARGE: more than 30 minutes.   Leia Alf Shivaun Bilello M.D on 02/07/2018 at 2:51 PM  Between 7am to 6pm - Pager - [phone]  After 6pm go to [email or web address] - password [email or web address] [facility]  Office  [phone]  CC: Primary care physician; Sofie Hartigan, MD  Note: This dictation was prepared with Dragon dictation along with smaller phrase technology. Any transcriptional errors that result from this process are unintentional.

## 2018-02-13 DIAGNOSIS — N184 Chronic kidney disease, stage 4 (severe): Secondary | ICD-10-CM | POA: Diagnosis not present

## 2018-02-20 ENCOUNTER — Other Ambulatory Visit: Payer: Self-pay

## 2018-02-20 ENCOUNTER — Inpatient Hospital Stay
Admission: EM | Admit: 2018-02-20 | Discharge: 2018-02-26 | DRG: 378 | Disposition: A | Discharge status: Skilled Nursing Facility | Payer: Medicare HMO | Attending: Internal Medicine | Admitting: Internal Medicine

## 2018-02-20 DIAGNOSIS — H4010X0 Unspecified open-angle glaucoma, stage unspecified: Secondary | ICD-10-CM | POA: Diagnosis not present

## 2018-02-20 DIAGNOSIS — Z853 Personal history of malignant neoplasm of breast: Secondary | ICD-10-CM

## 2018-02-20 DIAGNOSIS — E1122 Type 2 diabetes mellitus with diabetic chronic kidney disease: Secondary | ICD-10-CM | POA: Diagnosis present

## 2018-02-20 DIAGNOSIS — Z794 Long term (current) use of insulin: Secondary | ICD-10-CM

## 2018-02-20 DIAGNOSIS — Z79899 Other long term (current) drug therapy: Secondary | ICD-10-CM

## 2018-02-20 DIAGNOSIS — E8881 Metabolic syndrome: Secondary | ICD-10-CM | POA: Diagnosis present

## 2018-02-20 DIAGNOSIS — E785 Hyperlipidemia, unspecified: Secondary | ICD-10-CM | POA: Diagnosis present

## 2018-02-20 DIAGNOSIS — E119 Type 2 diabetes mellitus without complications: Secondary | ICD-10-CM | POA: Diagnosis not present

## 2018-02-20 DIAGNOSIS — Z7982 Long term (current) use of aspirin: Secondary | ICD-10-CM | POA: Diagnosis not present

## 2018-02-20 DIAGNOSIS — R05 Cough: Secondary | ICD-10-CM | POA: Diagnosis not present

## 2018-02-20 DIAGNOSIS — R21 Rash and other nonspecific skin eruption: Secondary | ICD-10-CM | POA: Diagnosis not present

## 2018-02-20 DIAGNOSIS — D500 Iron deficiency anemia secondary to blood loss (chronic): Secondary | ICD-10-CM | POA: Diagnosis not present

## 2018-02-20 DIAGNOSIS — N390 Urinary tract infection, site not specified: Secondary | ICD-10-CM | POA: Diagnosis not present

## 2018-02-20 DIAGNOSIS — K5521 Angiodysplasia of colon with hemorrhage: Secondary | ICD-10-CM | POA: Diagnosis not present

## 2018-02-20 DIAGNOSIS — M069 Rheumatoid arthritis, unspecified: Secondary | ICD-10-CM | POA: Diagnosis present

## 2018-02-20 DIAGNOSIS — R4020 Unspecified coma: Secondary | ICD-10-CM | POA: Diagnosis not present

## 2018-02-20 DIAGNOSIS — Z87891 Personal history of nicotine dependence: Secondary | ICD-10-CM

## 2018-02-20 DIAGNOSIS — E1149 Type 2 diabetes mellitus with other diabetic neurological complication: Secondary | ICD-10-CM

## 2018-02-20 DIAGNOSIS — J449 Chronic obstructive pulmonary disease, unspecified: Secondary | ICD-10-CM | POA: Diagnosis present

## 2018-02-20 DIAGNOSIS — Z9049 Acquired absence of other specified parts of digestive tract: Secondary | ICD-10-CM

## 2018-02-20 DIAGNOSIS — Z9011 Acquired absence of right breast and nipple: Secondary | ICD-10-CM

## 2018-02-20 DIAGNOSIS — K31811 Angiodysplasia of stomach and duodenum with bleeding: Secondary | ICD-10-CM | POA: Diagnosis not present

## 2018-02-20 DIAGNOSIS — H409 Unspecified glaucoma: Secondary | ICD-10-CM | POA: Diagnosis not present

## 2018-02-20 DIAGNOSIS — M1990 Unspecified osteoarthritis, unspecified site: Secondary | ICD-10-CM | POA: Diagnosis present

## 2018-02-20 DIAGNOSIS — D649 Anemia, unspecified: Secondary | ICD-10-CM | POA: Diagnosis not present

## 2018-02-20 DIAGNOSIS — I272 Pulmonary hypertension, unspecified: Secondary | ICD-10-CM | POA: Diagnosis not present

## 2018-02-20 DIAGNOSIS — K922 Gastrointestinal hemorrhage, unspecified: Secondary | ICD-10-CM | POA: Diagnosis present

## 2018-02-20 DIAGNOSIS — R0602 Shortness of breath: Secondary | ICD-10-CM | POA: Diagnosis not present

## 2018-02-20 DIAGNOSIS — Z8051 Family history of malignant neoplasm of kidney: Secondary | ICD-10-CM

## 2018-02-20 DIAGNOSIS — E1140 Type 2 diabetes mellitus with diabetic neuropathy, unspecified: Secondary | ICD-10-CM | POA: Diagnosis not present

## 2018-02-20 DIAGNOSIS — N183 Chronic kidney disease, stage 3 (moderate): Secondary | ICD-10-CM | POA: Diagnosis present

## 2018-02-20 DIAGNOSIS — M6281 Muscle weakness (generalized): Secondary | ICD-10-CM | POA: Diagnosis not present

## 2018-02-20 DIAGNOSIS — K7460 Unspecified cirrhosis of liver: Secondary | ICD-10-CM | POA: Diagnosis present

## 2018-02-20 DIAGNOSIS — K921 Melena: Secondary | ICD-10-CM | POA: Diagnosis not present

## 2018-02-20 DIAGNOSIS — I10 Essential (primary) hypertension: Secondary | ICD-10-CM | POA: Diagnosis not present

## 2018-02-20 DIAGNOSIS — IMO0002 Reserved for concepts with insufficient information to code with codable children: Secondary | ICD-10-CM

## 2018-02-20 DIAGNOSIS — N179 Acute kidney failure, unspecified: Secondary | ICD-10-CM | POA: Diagnosis not present

## 2018-02-20 DIAGNOSIS — R0600 Dyspnea, unspecified: Secondary | ICD-10-CM

## 2018-02-20 DIAGNOSIS — R300 Dysuria: Secondary | ICD-10-CM | POA: Diagnosis present

## 2018-02-20 DIAGNOSIS — K5520 Angiodysplasia of colon without hemorrhage: Secondary | ICD-10-CM | POA: Diagnosis not present

## 2018-02-20 DIAGNOSIS — E1165 Type 2 diabetes mellitus with hyperglycemia: Secondary | ICD-10-CM

## 2018-02-20 DIAGNOSIS — I129 Hypertensive chronic kidney disease with stage 1 through stage 4 chronic kidney disease, or unspecified chronic kidney disease: Secondary | ICD-10-CM | POA: Diagnosis present

## 2018-02-20 DIAGNOSIS — R509 Fever, unspecified: Secondary | ICD-10-CM | POA: Diagnosis present

## 2018-02-20 HISTORY — DX: Gastrointestinal hemorrhage, unspecified: K92.2

## 2018-02-20 LAB — CBC
HEMATOCRIT: 19.6 % — AB (ref 35.0–47.0)
HEMOGLOBIN: 6.4 g/dL — AB (ref 12.0–16.0)
MCH: 33.1 pg (ref 26.0–34.0)
MCHC: 32.7 g/dL (ref 32.0–36.0)
MCV: 101.3 fL — ABNORMAL HIGH (ref 80.0–100.0)
Platelets: 242 10*3/uL (ref 150–440)
RBC: 1.93 MIL/uL — ABNORMAL LOW (ref 3.80–5.20)
RDW: 17 % — AB (ref 11.5–14.5)
WBC: 9 10*3/uL (ref 3.6–11.0)

## 2018-02-20 LAB — COMPREHENSIVE METABOLIC PANEL
ALBUMIN: 3.4 g/dL — AB (ref 3.5–5.0)
ALK PHOS: 60 U/L (ref 38–126)
ALT: 19 U/L (ref 0–44)
ANION GAP: 10 (ref 5–15)
AST: 34 U/L (ref 15–41)
BUN: 39 mg/dL — AB (ref 8–23)
CALCIUM: 8.8 mg/dL — AB (ref 8.9–10.3)
CO2: 25 mmol/L (ref 22–32)
Chloride: 98 mmol/L (ref 98–111)
Creatinine, Ser: 2.06 mg/dL — ABNORMAL HIGH (ref 0.44–1.00)
GFR calc Af Amer: 26 mL/min — ABNORMAL LOW (ref >60)
GFR calc non Af Amer: 22 mL/min — ABNORMAL LOW (ref >60)
GLUCOSE: 240 mg/dL — AB (ref 70–99)
POTASSIUM: 4.4 mmol/L (ref 3.5–5.1)
SODIUM: 133 mmol/L — AB (ref 135–145)
Total Bilirubin: 0.8 mg/dL (ref 0.3–1.2)
Total Protein: 6.7 g/dL (ref 6.5–8.1)

## 2018-02-20 LAB — GLUCOSE, CAPILLARY: GLUCOSE-CAPILLARY: 108 mg/dL — AB (ref 70–99)

## 2018-02-20 LAB — PREPARE RBC (CROSSMATCH)

## 2018-02-20 MED ORDER — HYDROCODONE-ACETAMINOPHEN 5-325 MG PO TABS
1.0000 | ORAL_TABLET | ORAL | Status: DC | PRN
  Administered 2018-02-21: 1 via ORAL
  Administered 2018-02-22 – 2018-02-23 (×4): 2 via ORAL
  Filled 2018-02-20 (×2): qty 2
  Filled 2018-02-20: qty 1
  Filled 2018-02-20 (×2): qty 2

## 2018-02-20 MED ORDER — SIMVASTATIN 20 MG PO TABS
20.0000 mg | ORAL_TABLET | Freq: Every day | ORAL | Status: DC
  Administered 2018-02-20 – 2018-02-25 (×4): 20 mg via ORAL
  Filled 2018-02-20 (×7): qty 1

## 2018-02-20 MED ORDER — INSULIN ASPART 100 UNIT/ML ~~LOC~~ SOLN
0.0000 [IU] | Freq: Every day | SUBCUTANEOUS | Status: DC
  Administered 2018-02-25: 2 [IU] via SUBCUTANEOUS
  Filled 2018-02-20: qty 1

## 2018-02-20 MED ORDER — MAGNESIUM OXIDE 400 (241.3 MG) MG PO TABS
800.0000 mg | ORAL_TABLET | Freq: Every day | ORAL | Status: DC
  Administered 2018-02-21 – 2018-02-26 (×5): 800 mg via ORAL
  Filled 2018-02-20 (×5): qty 2

## 2018-02-20 MED ORDER — SODIUM CHLORIDE 0.9% IV SOLUTION: Freq: Once | INTRAVENOUS | Status: DC

## 2018-02-20 MED ORDER — VITAMIN B-12 1000 MCG PO TABS
1000.0000 ug | ORAL_TABLET | Freq: Every day | ORAL | Status: DC
  Administered 2018-02-21 – 2018-02-26 (×5): 1000 ug via ORAL
  Filled 2018-02-20 (×5): qty 1

## 2018-02-20 MED ORDER — ACETAMINOPHEN 650 MG RE SUPP: 650.0000 mg | Freq: Four times a day (QID) | RECTAL | Status: DC | PRN

## 2018-02-20 MED ORDER — HYDROXYZINE HCL 10 MG PO TABS
10.0000 mg | ORAL_TABLET | Freq: Every day | ORAL | Status: DC
  Administered 2018-02-20 – 2018-02-22 (×2): 10 mg via ORAL
  Filled 2018-02-20 (×4): qty 1

## 2018-02-20 MED ORDER — BISACODYL 5 MG PO TBEC: 5.0000 mg | DELAYED_RELEASE_TABLET | Freq: Every day | ORAL | Status: DC | PRN

## 2018-02-20 MED ORDER — NIACIN ER (ANTIHYPERLIPIDEMIC) 500 MG PO TBCR
500.0000 mg | EXTENDED_RELEASE_TABLET | Freq: Every evening | ORAL | Status: DC
  Administered 2018-02-20 – 2018-02-25 (×6): 500 mg via ORAL
  Filled 2018-02-20 (×7): qty 1

## 2018-02-20 MED ORDER — LORATADINE 10 MG PO TABS
10.0000 mg | ORAL_TABLET | Freq: Every day | ORAL | Status: DC
  Administered 2018-02-21 – 2018-02-26 (×5): 10 mg via ORAL
  Filled 2018-02-20 (×5): qty 1

## 2018-02-20 MED ORDER — ONDANSETRON HCL 4 MG PO TABS: 4.0000 mg | ORAL_TABLET | Freq: Four times a day (QID) | ORAL | Status: DC | PRN

## 2018-02-20 MED ORDER — SODIUM CHLORIDE 0.9% IV SOLUTION
Freq: Once | INTRAVENOUS | Status: AC
  Administered 2018-02-20: 21:00:00 via INTRAVENOUS

## 2018-02-20 MED ORDER — ALBUTEROL SULFATE (2.5 MG/3ML) 0.083% IN NEBU
2.5000 mg | INHALATION_SOLUTION | RESPIRATORY_TRACT | Status: DC | PRN
  Administered 2018-02-24 – 2018-02-25 (×2): 2.5 mg via RESPIRATORY_TRACT
  Filled 2018-02-20 (×2): qty 3

## 2018-02-20 MED ORDER — ONDANSETRON HCL 4 MG/2ML IJ SOLN
4.0000 mg | Freq: Four times a day (QID) | INTRAMUSCULAR | Status: DC | PRN
  Administered 2018-02-21: 4 mg via INTRAVENOUS
  Filled 2018-02-20: qty 2

## 2018-02-20 MED ORDER — SODIUM CHLORIDE 0.9 % IV SOLN
INTRAVENOUS | Status: DC
  Administered 2018-02-20 – 2018-02-23 (×5): via INTRAVENOUS

## 2018-02-20 MED ORDER — INSULIN ASPART 100 UNIT/ML ~~LOC~~ SOLN
0.0000 [IU] | Freq: Three times a day (TID) | SUBCUTANEOUS | Status: DC
  Administered 2018-02-21 – 2018-02-22 (×3): 1 [IU] via SUBCUTANEOUS
  Administered 2018-02-22: 5 [IU] via SUBCUTANEOUS
  Administered 2018-02-22 – 2018-02-23 (×2): 1 [IU] via SUBCUTANEOUS
  Administered 2018-02-23 – 2018-02-24 (×3): 2 [IU] via SUBCUTANEOUS
  Administered 2018-02-24: 3 [IU] via SUBCUTANEOUS
  Administered 2018-02-24: 2 [IU] via SUBCUTANEOUS
  Administered 2018-02-25: 7 [IU] via SUBCUTANEOUS
  Administered 2018-02-25: 3 [IU] via SUBCUTANEOUS
  Administered 2018-02-26 (×2): 2 [IU] via SUBCUTANEOUS
  Filled 2018-02-20 (×13): qty 1

## 2018-02-20 MED ORDER — GABAPENTIN 300 MG PO CAPS
600.0000 mg | ORAL_CAPSULE | Freq: Three times a day (TID) | ORAL | Status: DC
  Administered 2018-02-20 – 2018-02-24 (×8): 600 mg via ORAL
  Filled 2018-02-20 (×9): qty 2

## 2018-02-20 MED ORDER — ACETAMINOPHEN 325 MG PO TABS
650.0000 mg | ORAL_TABLET | Freq: Once | ORAL | Status: AC
  Administered 2018-02-20: 650 mg via ORAL
  Filled 2018-02-20: qty 2

## 2018-02-20 MED ORDER — FAMOTIDINE IN NACL 20-0.9 MG/50ML-% IV SOLN
20.0000 mg | INTRAVENOUS | Status: DC
  Administered 2018-02-20 – 2018-02-23 (×4): 20 mg via INTRAVENOUS
  Filled 2018-02-20 (×4): qty 50

## 2018-02-20 MED ORDER — VITAMIN D 1000 UNITS PO TABS
2000.0000 [IU] | ORAL_TABLET | Freq: Every day | ORAL | Status: DC
  Administered 2018-02-21 – 2018-02-26 (×5): 2000 [IU] via ORAL
  Filled 2018-02-20 (×5): qty 2

## 2018-02-20 MED ORDER — SENNOSIDES-DOCUSATE SODIUM 8.6-50 MG PO TABS
1.0000 | ORAL_TABLET | Freq: Every evening | ORAL | Status: DC | PRN
Start: 2018-02-20 — End: 2018-02-26

## 2018-02-20 MED ORDER — FERROUS FUMARATE 324 (106 FE) MG PO TABS
1.0000 | ORAL_TABLET | Freq: Two times a day (BID) | ORAL | Status: DC
  Administered 2018-02-20 – 2018-02-21 (×2): 106 mg via ORAL
  Filled 2018-02-20 (×3): qty 1

## 2018-02-20 MED ORDER — ACETAMINOPHEN 325 MG PO TABS
650.0000 mg | ORAL_TABLET | Freq: Four times a day (QID) | ORAL | Status: DC | PRN
  Administered 2018-02-23: 650 mg via ORAL
  Filled 2018-02-20: qty 2

## 2018-02-20 NOTE — ED Notes (Signed)
MD to bedside with RN to perform POC occult blood - pt positive for occult blood.

## 2018-02-20 NOTE — Progress Notes (Signed)
Advanced Care Plan.  Purpose of Encounter: CODE STATUS. Parties in Attendance: The patient, her daughter-in-law, me. Patient's Decisional Capacity: Yes. Medical Story: Emily Faulkner  is a 77 y.o. female with a known history of hypertension, hyperlipidemia, diabetes, CKD, anemia, neuropathy, osteoarthritis, breast cancer.  Patient presented to ED with generalized weakness and the shortness of breath.  She is being admitted for anemia due to GI bleeding.  I discussed with the patient about patient current condition, prognosis and CODE STATUS.  The patient stated that she wants to be resuscitated but does not want to be on ventilator for long time.  Plan:  Code Status: Full code Time spent discussing advance care planning: 16-17 minutes.

## 2018-02-20 NOTE — ED Notes (Signed)
Patient transported to room 206

## 2018-02-20 NOTE — Progress Notes (Signed)
Orders not released for blood transfusion, Dr. Estanislado Pandy gave new verbal order to transfuse 1 unit. New orders placed and old orders d/c'd. Per Dr. Estanislado Pandy give 1 unit and recheck hgb, if needed will transfuse another unit. Wilnette Kales

## 2018-02-20 NOTE — H&P (Signed)
[facility] [facility] NAME: Emily Faulkner    MR#:  [id number]  DATE OF BIRTH:  01/09/1941  DATE OF ADMISSION:  02/20/2018  PRIMARY CARE PHYSICIAN: Sofie Hartigan, MD   REQUESTING/REFERRING PHYSICIAN: Dr. Rip Harbour.  CHIEF COMPLAINT:   Chief Complaint  Patient presents with  . Abnormal Lab   Generalized weakness and shortness of breath for few days. HISTORY OF PRESENT ILLNESS:  Emily Faulkner  is a 77 y.o. female with a known history of hypertension, hyperlipidemia, diabetes, CKD, anemia, neuropathy, osteoarthritis, breast cancer.  The patient presents the ED with above chief complaints.  She also complains of melena but she does not know if it is bleeding or not since she is taking iron.  She was found low hemoglobin 6.4.  Stool occult is positive.  Dr. Rip Harbour request admission. PAST MEDICAL HISTORY:   Past Medical History:  Diagnosis Date  . Cancer [facility])    breast cancer right mastectomy  . Diabetes mellitus without complication ([place])   . Glaucoma   . Hyperlipidemia   . Hypertension   . Neuropathy   . Osteoarthritis   . Rheumatoid arthritis ([place])   . Telangiectasias     PAST SURGICAL HISTORY:   Past Surgical History:  Procedure Laterality Date  . BREAST SURGERY     right mastectomy  . CHOLECYSTECTOMY    . COLONOSCOPY    . COLONOSCOPY WITH PROPOFOL N/A 08/14/2016   Procedure: COLONOSCOPY WITH PROPOFOL;  Surgeon: Lollie Sails, MD;  [facility] ENDOSCOPY;  Service: Endoscopy;  Laterality: N/A;  . ESOPHAGOGASTRODUODENOSCOPY (EGD) WITH PROPOFOL N/A 08/14/2016   Procedure: ESOPHAGOGASTRODUODENOSCOPY (EGD) WITH PROPOFOL;  Surgeon: Lollie Sails, MD;  Location[facility] ENDOSCOPY;  Service: Endoscopy;  Laterality: N/A;  . ESOPHAGOGASTRODUODENOSCOPY (EGD) WITH PROPOFOL N/A 01/30/2018   Procedure: ESOPHAGOGASTRODUODENOSCOPY (EGD) WITH PROPOFOL;  Surgeon: Toledo, Benay Pike, MD;  Location: [facility] ENDOSCOPY;  Service:  Gastroenterology;  Laterality: N/A;  . EXCISIONAL HEMORRHOIDECTOMY      SOCIAL HISTORY:   Social History   Tobacco Use  . Smoking status: Former Smoker    Last attempt to quit: 07/10/2011    Years since quitting: 6.6  . Smokeless tobacco: Never Used  Substance Use Topics  . Alcohol use: No    FAMILY HISTORY:   Family History  Problem Relation Age of Onset  . Kidney cancer Son   . Bladder Cancer Neg Hx     DRUG ALLERGIES:  No Known Allergies  REVIEW OF SYSTEMS:   Review of Systems  Constitutional: Positive for malaise/fatigue. Negative for chills and fever.  HENT: Negative for sore throat.   Eyes: Negative for blurred vision and double vision.  Respiratory: Positive for shortness of breath. Negative for cough, hemoptysis, wheezing and stridor.   Cardiovascular: Negative for chest pain, palpitations, orthopnea and leg swelling.  Gastrointestinal: Positive for melena. Negative for abdominal pain, blood in stool, diarrhea, nausea and vomiting.  Genitourinary: Negative for dysuria, flank pain and hematuria.  Musculoskeletal: Negative for back pain and joint pain.  Skin: Negative for rash.  Neurological: Positive for dizziness. Negative for sensory change, focal weakness, seizures, loss of consciousness, weakness and headaches.  Endo/Heme/Allergies: Negative for polydipsia.  Psychiatric/Behavioral: Negative for depression. The patient is not nervous/anxious.     MEDICATIONS AT HOME:   Prior to Admission medications   Medication Sig Start Date End Date Taking? Authorizing Provider  aspirin 81 MG EC tablet Take 81 mg by mouth daily.  Yes [provider]  clindamycin (CLEOCIN T) 1 % external solution Apply 1 application topically 2 (two) times daily. 12/31/17  Yes [provider]  Ergocalciferol (VITAMIN D2) 2000 units TABS Take 1 tablet by mouth daily.   Yes [provider]  ferrous fumarate (HEMOCYTE - 106 MG FE) 325 (106 Fe) MG TABS tablet Take  1 tablet (106 mg of iron total) by mouth 2 (two) times daily. 01/31/18  Yes Sudini, Alveta Heimlich, MD  furosemide (LASIX) 40 MG tablet Take 1 tablet (40 mg total) by mouth 2 (two) times daily. 01/31/18  Yes Sudini, Alveta Heimlich, MD  gabapentin (NEURONTIN) 300 MG capsule Take 600 mg by mouth 3 (three) times daily.    Yes [provider]  glipiZIDE (GLUCOTROL) 10 MG tablet Take 10 mg by mouth daily.    Yes [provider]  hydrOXYzine (ATARAX/VISTARIL) 10 MG tablet Take 10 mg by mouth at bedtime. 11/11/17  Yes [provider]  Insulin Glargine (LANTUS SOLOSTAR) 100 UNIT/ML Solostar Pen Inject 36 Units into the skin daily. (may take up to 50u based upon blood glucose reading)   Yes [provider]  lisinopril (PRINIVIL,ZESTRIL) 2.5 MG tablet Take 2.5 mg by mouth daily.    Yes [provider]  loratadine (CLARITIN) 10 MG tablet Take 10 mg by mouth daily.   Yes [provider]  magnesium oxide (MAG-OX) 400 MG tablet Take 800 mg by mouth daily. 10/02/17  Yes [provider]  niacin (NIASPAN) 500 MG CR tablet Take 500 mg by mouth every evening. 12/25/17  Yes [provider]  Omega-3 Fatty Acids (FISH OIL) 1000 MG CAPS Take 1 capsule by mouth.    Yes [provider]  pantoprazole (PROTONIX) 40 MG tablet Take 1 tablet (40 mg total) by mouth 2 (two) times daily before a meal. 01/31/18  Yes Sudini, Srikar, MD  simvastatin (ZOCOR) 20 MG tablet Take 20 mg by mouth at bedtime. 12/23/17  Yes [provider]  sitaGLIPtin (JANUVIA) 50 MG tablet Take 50 mg by mouth daily.   Yes [provider]  TRAVATAN Z 0.004 % SOLN ophthalmic solution Place 1 drop into the left eye at bedtime. 12/05/17  Yes [provider]  vitamin B-12 (CYANOCOBALAMIN) 1000 MCG tablet Take 1,000 mcg by mouth daily.   Yes [provider]  ACCU-CHEK AVIVA PLUS test strip  02/04/18   [provider]  clobetasol (TEMOVATE) 0.05 % external solution  Apply 1 application topically every evening. (apply to scalp) 01/25/18   [provider]  ketoconazole (NIZORAL) 2 % shampoo Apply 1 application topically daily. (apply to scalp and wait at least 30 minutes before rinsing) 01/25/18   [provider]      VITAL SIGNS:  Blood pressure (!) 130/55, pulse 88, temperature 97.6 F (36.4 C), temperature source Oral, resp. rate 16, height 5' 4"  (1.626 m), weight 84.4 kg, SpO2 99 %.  PHYSICAL EXAMINATION:  Physical Exam  GENERAL:  77 y.o.-year-old patient lying in the bed with no acute distress.  Obesity. EYES: Pupils equal, round, reactive to light and accommodation. No scleral icterus. Extraocular muscles intact.  HEENT: Head atraumatic, normocephalic. Oropharynx and nasopharynx clear.  NECK:  Supple, no jugular venous distention. No thyroid enlargement, no tenderness.  LUNGS: Normal breath sounds bilaterally, no wheezing, rales,rhonchi or crepitation. No use of accessory muscles of respiration.  CARDIOVASCULAR: S1, S2 normal. No murmurs, rubs, or gallops.  ABDOMEN: Soft, nontender, distention. Bowel sounds present. No organomegaly or mass.  EXTREMITIES:  No pedal edema, cyanosis, or clubbing.  NEUROLOGIC: Cranial nerves II through XII are intact. Muscle strength 4/5 in all extremities. Sensation intact. Gait not checked.  PSYCHIATRIC: The patient is alert and oriented x 3.  SKIN: No obvious rash, lesion, or ulcer.   LABORATORY PANEL:   CBC Recent Labs  Lab 02/20/18 1429  WBC 9.0  HGB 6.4*  HCT 19.6*  PLT 242   ------------------------------------------------------------------------------------------------------------------  Chemistries  Recent Labs  Lab 02/20/18 1429  NA 133*  K 4.4  CL 98  CO2 25  GLUCOSE 240*  BUN 39*  CREATININE 2.06*  CALCIUM 8.8*  AST 34  ALT 19  ALKPHOS 60  BILITOT 0.8    ------------------------------------------------------------------------------------------------------------------  Cardiac Enzymes No results for input(s): TROPONINI in the last 168 hours. ------------------------------------------------------------------------------------------------------------------  RADIOLOGY:  No results found.    IMPRESSION AND PLAN:   Symptomatic anemia due to GI bleeding The patient will be admitted to medical floor. PRBC transfusion, n.p.o. except medication, PPI IV twice daily, IV fluid support, GI consult. Follow-up hemoglobin in a.m.  ARF on CKD stage III.  Hold Lasix and lisinopril, IV fluids support. Hypertension.  Controlled, hold Lasix and lisinopril for now.  All the records are reviewed and case discussed with ED provider. Management plans discussed with the patient, her daughter-in-law and they are in agreement.  CODE STATUS: Full code  TOTAL TIME TAKING CARE OF THIS PATIENT: 43 minutes.    Demetrios Loll M.D on 02/20/2018 at 5:09 PM  Between 7am to 6pm - Pager - [phone]  After 6pm go to [email or web address] - Proofreader  [facility]  Office  [phone]  CC: Primary care physician; Sofie Hartigan, MD   Note: This dictation was prepared with Dragon dictation along with smaller phrase technology. Any transcriptional errors that result from this process are unin

## 2018-02-20 NOTE — ED Triage Notes (Signed)
Pt states she was referred to the ED by her PCP who called her today about her labs drawn on 8/8 with a Hbg 6.3. States she has been feeling SOB and fatigued for the past couple of weeks and was just here a couple of weeks ago.

## 2018-02-20 NOTE — ED Provider Notes (Signed)
[facility] Emergency Department Provider Note   ____________________________________________   First MD Initiated Contact with Patient 02/20/18 1608     (approximate)  I have reviewed the triage vital signs and the nursing notes.   HISTORY  Chief Complaint Abnormal Lab    HPI Emily Faulkner is a 77 y.o. female sent from the doctor's office for a hemoglobin of 6.3.  Patient reports she is been feeling short of breath and weak when she walks lightheaded when she sits up.  She is taking iron pills and is been having black stools for some time.  She is not having any chest pain or belly pain or vomiting.  Past Medical History:  Diagnosis Date  . Cancer [facility])    breast cancer right mastectomy  . Diabetes mellitus without complication ([place])   . Glaucoma   . Hyperlipidemia   . Hypertension   . Neuropathy   . Osteoarthritis   . Rheumatoid arthritis ([place])   . Telangiectasias     Patient Active Problem List   Diagnosis Date Noted  . Microalbuminuric diabetic nephropathy ([place]) 11/02/2016  . Diabetes mellitus, type 2 ([place]) 11/02/2016  . Diabetic neuropathy, type II diabetes mellitus ([place]) 11/02/2016  . Pulmonary hypertension ([place]) 09/11/2016  . Shortness of breath 09/06/2016  . Hypertension 09/06/2016  . Hyperkalemia 09/06/2016  . High cholesterol 09/06/2016  . Anemia 09/06/2016  . Diabetes mellitus ([place]) 09/06/2016  . Iron deficiency anemia 04/15/2016  . Type 2 diabetes mellitus with neurological manifestations, uncontrolled ([place]) 08/04/2014  . Hyperlipidemia, unspecified 01/22/2014  . Personal history of breast cancer 07/17/2012    Past Surgical History:  Procedure Laterality Date  . BREAST SURGERY     right mastectomy  . CHOLECYSTECTOMY    . COLONOSCOPY    . COLONOSCOPY WITH PROPOFOL N/A 08/14/2016   Procedure: COLONOSCOPY WITH PROPOFOL;  Surgeon: Lollie Sails, MD;  Location: [facility] ENDOSCOPY;  Service: Endoscopy;  Laterality: N/A;  .  ESOPHAGOGASTRODUODENOSCOPY (EGD) WITH PROPOFOL N/A 08/14/2016   Procedure: ESOPHAGOGASTRODUODENOSCOPY (EGD) WITH PROPOFOL;  Surgeon: Lollie Sails, MD;  Location: [facility] ENDOSCOPY;  Service: Endoscopy;  Laterality: N/A;  . ESOPHAGOGASTRODUODENOSCOPY (EGD) WITH PROPOFOL N/A 01/30/2018   Procedure: ESOPHAGOGASTRODUODENOSCOPY (EGD) WITH PROPOFOL;  Surgeon: Toledo, Benay Pike, MD;  Location: [facility] ENDOSCOPY;  Service: Gastroenterology;  Laterality: N/A;  . EXCISIONAL HEMORRHOIDECTOMY      Prior to Admission medications   Medication Sig Start Date End Date Taking? Authorizing Provider  ACCU-CHEK AVIVA PLUS test strip  02/04/18   [provider]  aspirin 81 MG EC tablet Take 81 mg by mouth daily.     [provider]  clindamycin (CLEOCIN T) 1 % external solution Apply 1 application topically 2 (two) times daily. 12/31/17   [provider]  clobetasol (TEMOVATE) 0.05 % external solution Apply 1 application topically every evening. (apply to scalp) 01/25/18   [provider]  Ergocalciferol (VITAMIN D2) 2000 units TABS Take 1 tablet by mouth daily.    [provider]  ferrous fumarate (HEMOCYTE - 106 MG FE) 325 (106 Fe) MG TABS tablet Take 1 tablet (106 mg of iron total) by mouth 2 (two) times daily. 01/31/18   Hillary Bow, MD  furosemide (LASIX) 40 MG tablet Take 1 tablet (40 mg total) by mouth 2 (two) times daily. 01/31/18   Hillary Bow, MD  gabapentin (NEURONTIN) 300 MG capsule Take 600 mg by mouth 3 (three) times daily.     [provider]  glipiZIDE (GLUCOTROL)  10 MG tablet Take 10 mg by mouth daily.     [provider]  hydrOXYzine (ATARAX/VISTARIL) 10 MG tablet Take 10 mg by mouth at bedtime. 11/11/17   [provider]  Insulin Glargine (LANTUS SOLOSTAR) 100 UNIT/ML Solostar Pen Inject 36 Units into the skin daily. (may take up to 50u based upon blood glucose reading)    [provider]  ketoconazole (NIZORAL) 2 % shampoo  Apply 1 application topically daily. (apply to scalp and wait at least 30 minutes before rinsing) 01/25/18   [provider]  lisinopril (PRINIVIL,ZESTRIL) 2.5 MG tablet Take 2.5 mg by mouth daily.     [provider]  loratadine (CLARITIN) 10 MG tablet Take 10 mg by mouth daily.    [provider]  magnesium oxide (MAG-OX) 400 MG tablet Take 800 mg by mouth daily. 10/02/17   [provider]  Magnesium Oxide 400 (240 Mg) MG TABS TAKE 2 TABLETS (800 MG TOTAL) BY MOUTH DAILY. 01/29/18   [provider]  niacin (NIASPAN) 500 MG CR tablet Take 500 mg by mouth every evening. 12/25/17   [provider]  Omega-3 Fatty Acids (FISH OIL) 1000 MG CAPS Take 1 capsule by mouth.     [provider]  pantoprazole (PROTONIX) 40 MG tablet Take 1 tablet (40 mg total) by mouth 2 (two) times daily before a meal. 01/31/18   Sudini, Alveta Heimlich, MD  simvastatin (ZOCOR) 20 MG tablet Take 20 mg by mouth at bedtime. 12/23/17   [provider]  sitaGLIPtin (JANUVIA) 50 MG tablet Take 50 mg by mouth daily.    [provider]  TRAVATAN Z 0.004 % SOLN ophthalmic solution Place 1 drop into the left eye at bedtime. 12/05/17   [provider]  vitamin B-12 (CYANOCOBALAMIN) 1000 MCG tablet Take 1,000 mcg by mouth daily.    [provider]    Allergies Patient has no known allergies.  Family History  Problem Relation Age of Onset  . Kidney cancer Son   . Bladder Cancer Neg Hx     Social History Social History   Tobacco Use  . Smoking status: Former Smoker    Last attempt to quit: 07/10/2011    Years since quitting: 6.6  . Smokeless tobacco: Never Used  Substance Use Topics  . Alcohol use: No  . Drug use: No    Review of Systems  Constitutional: No fever/chills Eyes: No visual changes. ENT: No sore throat. Cardiovascular: Denies chest pain. Respiratory: Some shortness of breath. Gastrointestinal: No abdominal pain.  No  nausea, no vomiting.  No diarrhea.  No constipation. Genitourinary: Negative for dysuria. Musculoskeletal: Negative for back pain. Skin: Negative for rash. Neurological: Negative for headaches, focal weakness   ____________________________________________   PHYSICAL EXAM:  VITAL SIGNS: ED Triage Vitals  Enc Vitals Group     BP 02/20/18 1418 (!) 134/47     Pulse Rate 02/20/18 1418 92     Resp 02/20/18 1418 (!) 22     Temp 02/20/18 1418 97.6 F (36.4 C)     Temp Source 02/20/18 1418 Oral     SpO2 02/20/18 1418 98 %     Weight 02/20/18 1419 186 lb (84.4 kg)     Height 02/20/18 1419 5' 4"  (1.626 m)     Head Circumference --      Peak Flow --      Pain Score 02/20/18 1419 6     Pain Loc --      Pain  Edu? --      Excl. in [place]? --     Constitutional: Alert and oriented. Well appearing and in no acute distress. Eyes: Conjunctivae are normal.  Head: Atraumatic. Nose: No congestion/rhinnorhea. Mouth/Throat: Mucous membranes are moist.  Oropharynx non-erythematous. Neck: No stridor.   Cardiovascular: Normal rate, regular rhythm. Grossly normal heart sounds.  Good peripheral circulation. Respiratory: Normal respiratory effort.  No retractions. Lungs CTAB. Gastrointestinal: Soft and nontender. No distention. No abdominal bruits. No CVA tenderness. Rectal: Multiple hemorrhoidal tags are present no bleeding hemorrhoids are seen there is no masses on rectal exam stool is black and Hemoccult positive  musculoskeletal: No lower extremity tenderness nor edema.   Neurologic:  Normal speech and language. No gross focal neurologic deficits are appreciated.  Skin:  Skin is warm, dry and intact. No rash noted. Psychiatric: Mood and affect are normal. Speech and behavior are normal.  ____________________________________________   LABS (all labs ordered are listed, but only abnormal results are displayed)  Labs Reviewed  COMPREHENSIVE METABOLIC PANEL - Abnormal; Notable for the following  components:      Result Value   Sodium 133 (*)    Glucose, Bld 240 (*)    BUN 39 (*)    Creatinine, Ser 2.06 (*)    Calcium 8.8 (*)    Albumin 3.4 (*)    GFR calc non Af Amer 22 (*)    GFR calc Af Amer 26 (*)    All other components within normal limits  CBC - Abnormal; Notable for the following components:   RBC 1.93 (*)    Hemoglobin 6.4 (*)    HCT 19.6 (*)    MCV 101.3 (*)    RDW 17.0 (*)    All other components within normal limits  POC OCCULT BLOOD, ED  TYPE AND SCREEN   ____________________________________________  EKG EKG read interpreted by me shows normal sinus rhythm rate of 91 slight left axis no acute ST-T changes there is incomplete bundle branch block with a QRS duration of 102 ms  ____________________________________________  RADIOLOGY  ED MD interpretation:    Official radiology report(s): No results found.  ____________________________________________   PROCEDURES  Procedure(s) performed:   Procedures  Critical Care performed:   ____________________________________________   INITIAL IMPRESSION / ASSESSMENT AND PLAN / ED COURSE  Patient with symptomatic anemia and GI bleeding.  We will admit her for transfusion and further work-up.    Clinical Course as of Feb 20 1621  Thu Feb 20, 2018  1608 Type and screen [facility] [PM]    Clinical Course User Index [PM] Nena Polio, MD     ____________________________________________   FINAL CLINICAL IMPRESSION(S) / ED DIAGNOSES  Final diagnoses:  Symptomatic anemia  Gastrointestinal hemorrhage with melena     ED Discharge Orders    None       Note:  This document was prepared using Dragon voice recognition software and may include unintentional dictation errors.    Nena Polio, MD 02/20/18 [id number]

## 2018-02-21 LAB — BASIC METABOLIC PANEL
ANION GAP: 7 (ref 5–15)
BUN: 38 mg/dL — ABNORMAL HIGH (ref 8–23)
CALCIUM: 9 mg/dL (ref 8.9–10.3)
CO2: 28 mmol/L (ref 22–32)
Chloride: 104 mmol/L (ref 98–111)
Creatinine, Ser: 1.92 mg/dL — ABNORMAL HIGH (ref 0.44–1.00)
GFR, EST AFRICAN AMERICAN: 28 mL/min — AB (ref >60)
GFR, EST NON AFRICAN AMERICAN: 24 mL/min — AB (ref >60)
GLUCOSE: 96 mg/dL (ref 70–99)
Potassium: 4.2 mmol/L (ref 3.5–5.1)
SODIUM: 139 mmol/L (ref 135–145)

## 2018-02-21 LAB — CBC
HEMATOCRIT: 22 % — AB (ref 35.0–47.0)
Hemoglobin: 7.4 g/dL — ABNORMAL LOW (ref 12.0–16.0)
MCH: 33.2 pg (ref 26.0–34.0)
MCHC: 33.7 g/dL (ref 32.0–36.0)
MCV: 98.6 fL (ref 80.0–100.0)
Platelets: 202 10*3/uL (ref 150–440)
RBC: 2.23 MIL/uL — AB (ref 3.80–5.20)
RDW: 17.5 % — ABNORMAL HIGH (ref 11.5–14.5)
WBC: 6.5 10*3/uL (ref 3.6–11.0)

## 2018-02-21 LAB — FOLATE: Folate: 32 ng/mL (ref >5.9)

## 2018-02-21 LAB — GLUCOSE, CAPILLARY
GLUCOSE-CAPILLARY: 118 mg/dL — AB (ref 70–99)
Glucose-Capillary: 84 mg/dL (ref 70–99)
Glucose-Capillary: 135 mg/dL — ABNORMAL HIGH (ref 70–99)
Glucose-Capillary: 140 mg/dL — ABNORMAL HIGH (ref 70–99)
Glucose-Capillary: 136 mg/dL — ABNORMAL HIGH (ref 70–99)

## 2018-02-21 LAB — IRON AND TIBC
Iron: 20 ug/dL — ABNORMAL LOW (ref 28–170)
SATURATION RATIOS: 5 % — AB (ref 10.4–31.8)
TIBC: 378 ug/dL (ref 250–450)
UIBC: 358 ug/dL

## 2018-02-21 LAB — HEMOGLOBIN AND HEMATOCRIT, BLOOD
HCT: 22.4 % — ABNORMAL LOW (ref 35.0–47.0)
HEMATOCRIT: 22.9 % — AB (ref 35.0–47.0)
HEMATOCRIT: 23 % — AB (ref 35.0–47.0)
HEMOGLOBIN: 7.4 g/dL — AB (ref 12.0–16.0)
HEMOGLOBIN: 7.5 g/dL — AB (ref 12.0–16.0)
Hemoglobin: 7.6 g/dL — ABNORMAL LOW (ref 12.0–16.0)

## 2018-02-21 LAB — FERRITIN: FERRITIN: 17 ng/mL (ref 11–307)

## 2018-02-21 LAB — VITAMIN B12: VITAMIN B 12: 2391 pg/mL — AB (ref 180–914)

## 2018-02-21 MED ORDER — SODIUM CHLORIDE 0.9 % IV SOLN
510.0000 mg | Freq: Once | INTRAVENOUS | Status: AC
  Administered 2018-02-21: 510 mg via INTRAVENOUS
  Filled 2018-02-21: qty 17

## 2018-02-21 MED ORDER — MAGNESIUM CITRATE PO SOLN
1.0000 | Freq: Once | ORAL | Status: DC
  Filled 2018-02-21: qty 296

## 2018-02-21 MED ORDER — PEG 3350-KCL-NA BICARB-NACL 420 G PO SOLR
4000.0000 mL | Freq: Once | ORAL | Status: AC
  Administered 2018-02-21: 4000 mL via ORAL
  Filled 2018-02-21: qty 4000

## 2018-02-21 NOTE — Consult Note (Signed)
[name], MD [address]  [place]  [place], [place] [address]  Main: [phone]  Fax: [phone] Pager: [phone]   Consultation  Referring Provider:     No ref. provider found Primary Care Physician:  Sofie Hartigan, MD Primary Gastroenterologist:  Dr. Alice Reichert         Reason for Consultation:     Symptomatic anemia  Date of Admission:  02/20/2018 Date of Consultation:  02/21/2018         HPI:   Emily Faulkner is a 77 y.o. Caucasian female who is functionally independent, metabolic syndrome, with chronic iron deficiency anemia, on oral iron therapy, diabetes, hypertension, hyperlipidemia, cirrhosis of liver, admitted for symptomatic anemia. Pt was seen by primary care doctor, on 02/07/2018 hemoglobin was found to be 7.1, repeat was 6.3 on 08/08, therefore, recommended to go to [facility] ER. She was admitted for further evaluation. She reports that her stools are black as she is taking oral iron 2 pills daily.  She was admitted in last week of July secondary to severe symptomatic anemia, underwent EGD which was unremarkable except for mild portal hypertensive gastropathy. Patient previously underwent EGD and colonoscopy in 08/2016 with no evidence of iron deficiency anemia identified. She never had a video capsule endoscopy performed. Patient denies abdominal pain, nausea, vomiting, rectal bleeding, hematochezia, hematemesis. Hemoglobin 6.4 on admission, received 2 units of PRBCs She has 2 sons who are at bedside  NSAIDs: None  Antiplts/Anticoagulants/Anti thrombotics: Aspirin 81  GI Procedures:  EGD 01/30/2018 by Dr. Alice Reichert - Widely patent and non-obstructing Schatzki ring. - Portal hypertensive gastropathy. - Normal examined duodenum. - The examination was otherwise normal. - No specimens collected.  Past Medical History:  Diagnosis Date  . Cancer [facility])    breast cancer right mastectomy  . Diabetes mellitus without  complication ([place])   . Glaucoma   . Hyperlipidemia   . Hypertension   . Neuropathy   . Osteoarthritis   . Rheumatoid arthritis ([place])   . Telangiectasias     Past Surgical History:  Procedure Laterality Date  . BREAST SURGERY     right mastectomy  . CHOLECYSTECTOMY    . COLONOSCOPY    . COLONOSCOPY WITH PROPOFOL N/A 08/14/2016   Procedure: COLONOSCOPY WITH PROPOFOL;  Surgeon: Lollie Sails, MD;  Location: [facility] ENDOSCOPY;  Service: Endoscopy;  Laterality: N/A;  . ESOPHAGOGASTRODUODENOSCOPY (EGD) WITH PROPOFOL N/A 08/14/2016   Procedure: ESOPHAGOGASTRODUODENOSCOPY (EGD) WITH PROPOFOL;  Surgeon: Lollie Sails, MD;  Location: [facility] ENDOSCOPY;  Service: Endoscopy;  Laterality: N/A;  . ESOPHAGOGASTRODUODENOSCOPY (EGD) WITH PROPOFOL N/A 01/30/2018   Procedure: ESOPHAGOGASTRODUODENOSCOPY (EGD) WITH PROPOFOL;  Surgeon: Toledo, Benay Pike, MD;  Location: [facility] ENDOSCOPY;  Service: Gastroenterology;  Laterality: N/A;  . EXCISIONAL HEMORRHOIDECTOMY      Prior to Admission medications   Medication Sig Start Date End Date Taking? Authorizing Provider  aspirin 81 MG EC tablet Take 81 mg by mouth daily.    Yes [provider]  clindamycin (CLEOCIN T) 1 % external solution Apply 1 application topically 2 (two) times daily. 12/31/17  Yes [provider]  Ergocalciferol (VITAMIN D2) 2000 units TABS Take 1 tablet by mouth daily.   Yes [provider]  ferrous fumarate (HEMOCYTE - 106 MG FE) 325 (106 Fe) MG TABS tablet Take 1 tablet (106 mg of iron total) by mouth 2 (two) times daily. 01/31/18  Yes Sudini, Alveta Heimlich, MD  furosemide (LASIX) 40 MG tablet Take 1  tablet (40 mg total) by mouth 2 (two) times daily. 01/31/18  Yes Sudini, Alveta Heimlich, MD  gabapentin (NEURONTIN) 300 MG capsule Take 600 mg by mouth 3 (three) times daily.    Yes [provider]  glipiZIDE (GLUCOTROL) 10 MG tablet Take 10 mg by mouth daily.    Yes [provider]  hydrOXYzine (ATARAX/VISTARIL) 10 MG  tablet Take 10 mg by mouth at bedtime. 11/11/17  Yes [provider]  Insulin Glargine (LANTUS SOLOSTAR) 100 UNIT/ML Solostar Pen Inject 36 Units into the skin daily. (may take up to 50u based upon blood glucose reading)   Yes [provider]  lisinopril (PRINIVIL,ZESTRIL) 2.5 MG tablet Take 2.5 mg by mouth daily.    Yes [provider]  loratadine (CLARITIN) 10 MG tablet Take 10 mg by mouth daily.   Yes [provider]  magnesium oxide (MAG-OX) 400 MG tablet Take 800 mg by mouth daily. 10/02/17  Yes [provider]  niacin (NIASPAN) 500 MG CR tablet Take 500 mg by mouth every evening. 12/25/17  Yes [provider]  Omega-3 Fatty Acids (FISH OIL) 1000 MG CAPS Take 1 capsule by mouth.    Yes [provider]  pantoprazole (PROTONIX) 40 MG tablet Take 1 tablet (40 mg total) by mouth 2 (two) times daily before a meal. 01/31/18  Yes Sudini, Srikar, MD  simvastatin (ZOCOR) 20 MG tablet Take 20 mg by mouth at bedtime. 12/23/17  Yes [provider]  sitaGLIPtin (JANUVIA) 50 MG tablet Take 50 mg by mouth daily.   Yes [provider]  TRAVATAN Z 0.004 % SOLN ophthalmic solution Place 1 drop into the left eye at bedtime. 12/05/17  Yes [provider]  vitamin B-12 (CYANOCOBALAMIN) 1000 MCG tablet Take 1,000 mcg by mouth daily.   Yes [provider]  ACCU-CHEK AVIVA PLUS test strip  02/04/18   [provider]  clobetasol (TEMOVATE) 0.05 % external solution Apply 1 application topically every evening. (apply to scalp) 01/25/18   [provider]  ketoconazole (NIZORAL) 2 % shampoo Apply 1 application topically daily. (apply to scalp and wait at least 30 minutes before rinsing) 01/25/18   [provider]    Family History  Problem Relation Age of Onset  . Kidney cancer Son   . Bladder Cancer Neg Hx      Social History   Tobacco Use  . Smoking status: Former Smoker    Last attempt to quit:  07/10/2011    Years since quitting: 6.6  . Smokeless tobacco: Never Used  Substance Use Topics  . Alcohol use: No  . Drug use: No    Allergies as of 02/20/2018  . (No Known Allergies)    Review of Systems:    All systems reviewed and negative except where noted in HPI.   Physical Exam:  Vital signs in last 24 hours: Temp:  [97.8 F (36.6 C)-99.4 F (37.4 C)] 98.7 F (37.1 C) (08/16 2012) Pulse Rate:  [81-93] 82 (08/16 2012) Resp:  [15-20] 20 (08/16 2012) BP: (106-127)/(45-59) 106/45 (08/16 2012) SpO2:  [93 %-100 %] 95 % (08/16 2012) Last BM Date: 02/20/18 General:   Pleasant, cooperative in NAD Head:  Normocephalic and atraumatic. Eyes:   No icterus.   Conjunctiva pink. PERRLA. Ears:  Normal auditory acuity. Neck:  Supple; no masses or thyroidomegaly Lungs: Respirations even and unlabored. Lungs clear to auscultation bilaterally.   No wheezes, crackles, or rhonchi.  Heart:  Regular rate and rhythm;  Without murmur,  clicks, rubs or gallops Abdomen:  Soft, obese, nondistended, nontender. Normal bowel sounds. No appreciable masses or hepatomegaly.  No rebound or guarding.  Rectal:  Not performed. Msk:  Symmetrical without gross deformities.  Strength generalized weakness Extremities:  Without edema, cyanosis or clubbing. Neurologic:  Alert and oriented x3;  grossly normal neurologically. Skin:  Intact without significant lesions or rashes. Cervical Nodes:  No significant cervical adenopathy. Psych:  Alert and cooperative. Normal affect.  LAB RESULTS: CBC Latest Ref Rng & Units 02/21/2018 02/21/2018 02/21/2018  WBC 3.6 - 11.0 K/uL - - 6.5  Hemoglobin 12.0 - 16.0 g/dL 7.4(L) 7.6(L) 7.4(L)  Hematocrit 35.0 - 47.0 % 22.4(L) 22.9(L) 22.0(L)  Platelets 150 - 440 K/uL - - 202    BMET BMP Latest Ref Rng & Units 02/21/2018 02/20/2018 01/31/2018  Glucose 70 - 99 mg/dL 96 240(H) 145(H)  BUN 8 - 23 mg/dL 38(H) 39(H) 29(H)  Creatinine 0.44 - 1.00 mg/dL 1.92(H) 2.06(H) 1.78(H)  Sodium  135 - 145 mmol/L 139 133(L) 142  Potassium 3.5 - 5.1 mmol/L 4.2 4.4 4.0  Chloride 98 - 111 mmol/L 104 98 104  CO2 22 - 32 mmol/L _0 Calcium 8.9 - 10.3 mg/dL 9.0 8.8(L) 8.9    LFT Hepatic Function Latest Ref Rng & Units 02/20/2018 01/29/2018  Total Protein 6.5 - 8.1 g/dL 6.7 7.5  Albumin 3.5 - 5.0 g/dL 3.4(L) 3.4(L)  AST 15 - 41 U/L 34 39  ALT 0 - 44 U/L 19 21  Alk Phosphatase 38 - 126 U/L 60 67  Total Bilirubin 0.3 - 1.2 mg/dL 0.8 0.5     STUDIES: No results found.    Impression / Plan:   Emily Faulkner is a 77 y.o. Caucasian female with metabolic syndrome, well compensated cirrhosis of liver, chronic iron deficiency anemia of unclear etiology. Patient underwent EGD and colonoscopy in the past with no GI source of bleeding identified. She never underwent video capsule endoscopy. She does not have evidence of active GI bleeding  - Recommend EGD/push enteroscopy and colonoscopy tomorrow - VCE if above tests are negative - Clear liquid diet - Discontinue oral iron while inpatient - Check iron studies and administer IV iron if needed - Nothing by mouth past midnight - Bowel prep tonight  I have discussed alternative options, risks & benefits,  which include, but are not limited to, bleeding, infection, perforation,respiratory complication & drug reaction.  The patient agrees with this plan & written consent will be obtained.     Thank you for involving me in the care of this patient.  Dr. Vicente Males to cover for the weekend    LOS: 1 day   Sherri Sear, MD  02/21/2018, 8:16 PM   Note: This dictation was prepared with Dragon dictation along with smaller phrase technology. Any transcriptional errors that result from this process are unintentional.

## 2018-02-21 NOTE — Progress Notes (Signed)
Emily Faulkner at [place] NAME: Emily Faulkner    MR#:  [id number]  DATE OF BIRTH:  12-Mar-1941  SUBJECTIVE:  CHIEF COMPLAINT: Is reporting generalized weakness he denies any hematemesis or melena.  In the emergency department stool for occult blood is positive and hemoglobin was at 6.4.    REVIEW OF SYSTEMS:  CONSTITUTIONAL: No fever, fatigue or weakness.  EYES: No blurred or double vision.  EARS, NOSE, AND THROAT: No tinnitus or ear pain.  RESPIRATORY: No cough, shortness of breath, wheezing or hemoptysis.  CARDIOVASCULAR: No chest pain, orthopnea, edema.  GASTROINTESTINAL: No nausea, vomiting, diarrhea or abdominal pain.  GENITOURINARY: No dysuria, hematuria.  ENDOCRINE: No polyuria, nocturia,  HEMATOLOGY: No anemia, easy bruising or bleeding SKIN: No rash or lesion. MUSCULOSKELETAL: No joint pain or arthritis.   NEUROLOGIC: No tingling, numbness, weakness.  PSYCHIATRY: No anxiety or depression.   DRUG ALLERGIES:  No Known Allergies  VITALS:  Blood pressure (!) 112/51, pulse 89, temperature 98.4 F (36.9 C), temperature source Oral, resp. rate 16, height 5' 4"  (1.626 m), weight 84.4 kg, SpO2 93 %.  PHYSICAL EXAMINATION:  GENERAL:  77 y.o.-year-old patient lying in the bed with no acute distress.  EYES: Pupils equal, round, reactive to light and accommodation. No scleral icterus. Extraocular muscles intact.  HEENT: Head atraumatic, normocephalic. Oropharynx and nasopharynx clear.  NECK:  Supple, no jugular venous distention. No thyroid enlargement, no tenderness.  LUNGS: Normal breath sounds bilaterally, no wheezing, rales,rhonchi or crepitation. No use of accessory muscles of respiration.  CARDIOVASCULAR: S1, S2 normal. No murmurs, rubs, or gallops.  ABDOMEN: Soft, nontender, nondistended. Bowel sounds present. No organomegaly or mass.  EXTREMITIES: No pedal edema, cyanosis, or clubbing.  NEUROLOGIC: Cranial nerves II through XII  are intact. Muscle strength 5/5 in all extremities. Sensation intact. Gait not checked.  PSYCHIATRIC: The patient is alert and oriented x 3.  SKIN: No obvious rash, lesion, or ulcer.    LABORATORY PANEL:   CBC Recent Labs  Lab 02/21/18 0326 02/21/18 1023  WBC 6.5  --   HGB 7.4* 7.6*  HCT 22.0* 22.9*  PLT 202  --    ------------------------------------------------------------------------------------------------------------------  Chemistries  Recent Labs  Lab 02/20/18 1429 02/21/18 0326  NA 133* 139  K 4.4 4.2  CL 98 104  CO2 25 28  GLUCOSE 240* 96  BUN 39* 38*  CREATININE 2.06* 1.92*  CALCIUM 8.8* 9.0  AST 34  --   ALT 19  --   ALKPHOS 60  --   BILITOT 0.8  --    ------------------------------------------------------------------------------------------------------------------  Cardiac Enzymes No results for input(s): TROPONINI in the last 168 hours. ------------------------------------------------------------------------------------------------------------------  RADIOLOGY:  No results found.  EKG:   Orders placed or performed during the hospital encounter of 02/20/18  . ED EKG  . ED EKG    ASSESSMENT AND PLAN:   Symptomatic anemia due to GI bleeding The patient will be admitted to medical floor. PRBC transfusion  n.p.o. except medication PPI IV twice daily, IV fluid support  GI consult. Patient has received a blood transfusion follow-up hemoglobin 6.4-7.4,7.6 Patient had EGD and colonoscopy in February 2018 which is revealed diverticulosis.  EGD has revealed esophagitis, gastritis and duodenitis patient sees Dr. Alice Reichert as an outpatient, just had a EGD in July 2019 non-obstructing Schatzki ring was found in the lower third of the esophagus   ARF on CKD stage III.  Hold Lasix and lisinopril, IV fluids support. Creatinine 2.06-1.92  Hypertension.  Controlled, hold Lasix and lisinopril for now.  Hyperlipidemia continue statin  History of Breast  cancer outpatient follow-up with oncology as recommended   All the records are reviewed and case discussed with Care Management/Social Workerr. Management plans discussed with the patient, family and they are in agreement.  CODE STATUS: fc   TOTAL TIME TAKING CARE OF THIS PATIENT: 35 minutes.   POSSIBLE D/C IN 1-2  DAYS, DEPENDING ON CLINICAL CONDITION.  Note: This dictation was prepared with Dragon dictation along with smaller phrase technology. Any transcriptional errors that result from this process are unintentional.   Nicholes Mango M.D on 02/21/2018 at 2:01 PM  Between 7am to 6pm - Pager - [phone] After 6pm go to [email or web address] - password [email or web address] [facility]  [facility]  Office  [phone]  CC: Primary care physician; Sofie Hartigan, MD

## 2018-02-22 ENCOUNTER — Encounter: Payer: Self-pay | Admitting: Anesthesiology

## 2018-02-22 ENCOUNTER — Inpatient Hospital Stay: Payer: Medicare HMO | Admitting: Anesthesiology

## 2018-02-22 ENCOUNTER — Encounter
Admission: EM | Disposition: A | Discharge status: Skilled Nursing Facility | Payer: Self-pay | Source: Home / Self Care | Attending: Internal Medicine

## 2018-02-22 DIAGNOSIS — D500 Iron deficiency anemia secondary to blood loss (chronic): Secondary | ICD-10-CM

## 2018-02-22 DIAGNOSIS — K5520 Angiodysplasia of colon without hemorrhage: Secondary | ICD-10-CM

## 2018-02-22 HISTORY — PX: ESOPHAGOGASTRODUODENOSCOPY (EGD) WITH PROPOFOL: SHX5813

## 2018-02-22 HISTORY — PX: COLONOSCOPY WITH PROPOFOL: SHX5780

## 2018-02-22 LAB — CBC
HCT: 21.5 % — ABNORMAL LOW (ref 35.0–47.0)
Hemoglobin: 7.1 g/dL — ABNORMAL LOW (ref 12.0–16.0)
MCH: 33.5 pg (ref 26.0–34.0)
MCHC: 33.2 g/dL (ref 32.0–36.0)
MCV: 101 fL — ABNORMAL HIGH (ref 80.0–100.0)
PLATELETS: 215 10*3/uL (ref 150–440)
RBC: 2.13 MIL/uL — ABNORMAL LOW (ref 3.80–5.20)
RDW: 18 % — AB (ref 11.5–14.5)
WBC: 5.4 10*3/uL (ref 3.6–11.0)

## 2018-02-22 LAB — GLUCOSE, CAPILLARY
GLUCOSE-CAPILLARY: 135 mg/dL — AB (ref 70–99)
GLUCOSE-CAPILLARY: 143 mg/dL — AB (ref 70–99)
GLUCOSE-CAPILLARY: 199 mg/dL — AB (ref 70–99)
Glucose-Capillary: 267 mg/dL — ABNORMAL HIGH (ref 70–99)

## 2018-02-22 LAB — BASIC METABOLIC PANEL
ANION GAP: 6 (ref 5–15)
BUN: 28 mg/dL — ABNORMAL HIGH (ref 8–23)
CALCIUM: 8.8 mg/dL — AB (ref 8.9–10.3)
CO2: 26 mmol/L (ref 22–32)
Chloride: 110 mmol/L (ref 98–111)
Creatinine, Ser: 1.73 mg/dL — ABNORMAL HIGH (ref 0.44–1.00)
GFR, EST AFRICAN AMERICAN: 32 mL/min — AB (ref >60)
GFR, EST NON AFRICAN AMERICAN: 27 mL/min — AB (ref >60)
Glucose, Bld: 144 mg/dL — ABNORMAL HIGH (ref 70–99)
Potassium: 4.4 mmol/L (ref 3.5–5.1)
SODIUM: 142 mmol/L (ref 135–145)

## 2018-02-22 SURGERY — ESOPHAGOGASTRODUODENOSCOPY (EGD) WITH PROPOFOL
Anesthesia: General

## 2018-02-22 SURGERY — ENTEROSCOPY
Anesthesia: General

## 2018-02-22 MED ORDER — LACTATED RINGERS IV SOLN
INTRAVENOUS | Status: DC | PRN
  Administered 2018-02-22: 13:00:00 via INTRAVENOUS

## 2018-02-22 MED ORDER — SODIUM CHLORIDE 0.9 % IV SOLN
100.0000 mg | INTRAVENOUS | Status: AC
  Administered 2018-02-22 – 2018-02-23 (×2): 100 mg via INTRAVENOUS
  Filled 2018-02-22 (×2): qty 5

## 2018-02-22 MED ORDER — EPHEDRINE SULFATE 50 MG/ML IJ SOLN
INTRAMUSCULAR | Status: DC | PRN
  Administered 2018-02-22: 20 mg via INTRAVENOUS
  Administered 2018-02-22: 10 mg via INTRAVENOUS

## 2018-02-22 MED ORDER — PROPOFOL 500 MG/50ML IV EMUL
INTRAVENOUS | Status: DC | PRN
  Administered 2018-02-22: 75 ug/kg/min via INTRAVENOUS

## 2018-02-22 NOTE — H&P (Signed)
Jonathon Bellows, MD [address], [place], [place], [place], [address] [address], [place], [place], [place], [address] Phone: [phone]  Fax: [phone]  Primary Care Physician:  Sofie Hartigan, MD   Pre-Procedure History & Physical: HPI:  Emily Faulkner is a 77 y.o. female is here for an endoscopy and colonoscopy    Past Medical History:  Diagnosis Date  . Cancer [facility])    breast cancer right mastectomy  . Diabetes mellitus without complication ([place])   . Glaucoma   . Hyperlipidemia   . Hypertension   . Neuropathy   . Osteoarthritis   . Rheumatoid arthritis ([place])   . Telangiectasias     Past Surgical History:  Procedure Laterality Date  . BREAST SURGERY     right mastectomy  . CHOLECYSTECTOMY    . COLONOSCOPY    . COLONOSCOPY WITH PROPOFOL N/A 08/14/2016   Procedure: COLONOSCOPY WITH PROPOFOL;  Surgeon: Lollie Sails, MD;  Location: [facility] ENDOSCOPY;  Service: Endoscopy;  Laterality: N/A;  . ESOPHAGOGASTRODUODENOSCOPY (EGD) WITH PROPOFOL N/A 08/14/2016   Procedure: ESOPHAGOGASTRODUODENOSCOPY (EGD) WITH PROPOFOL;  Surgeon: Lollie Sails, MD;  Location: [facility] ENDOSCOPY;  Service: Endoscopy;  Laterality: N/A;  . ESOPHAGOGASTRODUODENOSCOPY (EGD) WITH PROPOFOL N/A 01/30/2018   Procedure: ESOPHAGOGASTRODUODENOSCOPY (EGD) WITH PROPOFOL;  Surgeon: Toledo, Benay Pike, MD;  Location: [facility] ENDOSCOPY;  Service: Gastroenterology;  Laterality: N/A;  . EXCISIONAL HEMORRHOIDECTOMY      Prior to Admission medications   Medication Sig Start Date End Date Taking? Authorizing Provider  aspirin 81 MG EC tablet Take 81 mg by mouth daily.    Yes [provider]  clindamycin (CLEOCIN T) 1 % external solution Apply 1 application topically 2 (two) times daily. 12/31/17  Yes [provider]  Ergocalciferol (VITAMIN D2) 2000 units TABS Take 1 tablet by mouth daily.   Yes [provider]  ferrous fumarate (HEMOCYTE - 106 MG FE) 325 (106 Fe) MG TABS tablet  Take 1 tablet (106 mg of iron total) by mouth 2 (two) times daily. 01/31/18  Yes Sudini, Alveta Heimlich, MD  furosemide (LASIX) 40 MG tablet Take 1 tablet (40 mg total) by mouth 2 (two) times daily. 01/31/18  Yes Sudini, Alveta Heimlich, MD  gabapentin (NEURONTIN) 300 MG capsule Take 600 mg by mouth 3 (three) times daily.    Yes [provider]  glipiZIDE (GLUCOTROL) 10 MG tablet Take 10 mg by mouth daily.    Yes [provider]  hydrOXYzine (ATARAX/VISTARIL) 10 MG tablet Take 10 mg by mouth at bedtime. 11/11/17  Yes [provider]  Insulin Glargine (LANTUS SOLOSTAR) 100 UNIT/ML Solostar Pen Inject 36 Units into the skin daily. (may take up to 50u based upon blood glucose reading)   Yes [provider]  lisinopril (PRINIVIL,ZESTRIL) 2.5 MG tablet Take 2.5 mg by mouth daily.    Yes [provider]  loratadine (CLARITIN) 10 MG tablet Take 10 mg by mouth daily.   Yes [provider]  magnesium oxide (MAG-OX) 400 MG tablet Take 800 mg by mouth daily. 10/02/17  Yes [provider]  niacin (NIASPAN) 500 MG CR tablet Take 500 mg by mouth every evening. 12/25/17  Yes [provider]  Omega-3 Fatty Acids (FISH OIL) 1000 MG CAPS Take 1 capsule by mouth.    Yes [provider]  pantoprazole (PROTONIX) 40 MG tablet Take 1 tablet (40 mg total) by mouth 2 (two) times daily before a meal. 01/31/18  Yes Hillary Bow, MD  simvastatin (  ZOCOR) 20 MG tablet Take 20 mg by mouth at bedtime. 12/23/17  Yes [provider]  sitaGLIPtin (JANUVIA) 50 MG tablet Take 50 mg by mouth daily.   Yes [provider]  TRAVATAN Z 0.004 % SOLN ophthalmic solution Place 1 drop into the left eye at bedtime. 12/05/17  Yes [provider]  vitamin B-12 (CYANOCOBALAMIN) 1000 MCG tablet Take 1,000 mcg by mouth daily.   Yes [provider]  ACCU-CHEK AVIVA PLUS test strip  02/04/18   [provider]  clobetasol (TEMOVATE) 0.05 % external  solution Apply 1 application topically every evening. (apply to scalp) 01/25/18   [provider]  ketoconazole (NIZORAL) 2 % shampoo Apply 1 application topically daily. (apply to scalp and wait at least 30 minutes before rinsing) 01/25/18   [provider]    Allergies as of 02/20/2018  . (No Known Allergies)    Family History  Problem Relation Age of Onset  . Kidney cancer Son   . Bladder Cancer Neg Hx     Social History   Socioeconomic History  . Marital status: Married    Spouse name: Not on file  . Number of children: Not on file  . Years of education: Not on file  . Highest education level: Not on file  Occupational History  . Not on file  Social Needs  . Financial resource strain: Not on file  . Food insecurity:    Worry: Not on file    Inability: Not on file  . Transportation needs:    Medical: Not on file    Non-medical: Not on file  Tobacco Use  . Smoking status: Former Smoker    Last attempt to quit: 07/10/2011    Years since quitting: 6.6  . Smokeless tobacco: Never Used  Substance and Sexual Activity  . Alcohol use: No  . Drug use: No  . Sexual activity: Not on file  Lifestyle  . Physical activity:    Days per week: Not on file    Minutes per session: Not on file  . Stress: Not on file  Relationships  . Social connections:    Talks on phone: Not on file    Gets together: Not on file    Attends religious service: Not on file    Active member of club or organization: Not on file    Attends meetings of clubs or organizations: Not on file    Relationship status: Not on file  . Intimate partner violence:    Fear of current or ex partner: Not on file    Emotionally abused: Not on file    Physically abused: Not on file    Forced sexual activity: Not on file  Other Topics Concern  . Not on file  Social History Narrative  . Not on file    Review of Systems: See HPI, otherwise negative ROS  Physical Exam: BP (!) 107/56 (BP  Location: Left Arm)   Pulse 86   Temp 98.7 F (37.1 C) (Oral)   Resp 20   Ht 5' 4"  (1.626 m)   Wt 84.4 kg   SpO2 93%   BMI 31.93 kg/m  General:   Alert,  pleasant and cooperative in NAD Head:  Normocephalic and atraumatic. Neck:  Supple; no masses or thyromegaly. Lungs:  Clear throughout to auscultation, normal respiratory effort.    Heart:  +S1, +S2, Regular rate and rhythm, No edema. Abdomen:  Soft, nontender and nondistended. Normal bowel sounds, without guarding, and  without rebound.   Neurologic:  Alert and  oriented x4;  grossly normal neurologically.  Impression/Plan: Emily Faulkner is here for an endoscopy and colonoscopy  to be performed for  evaluation of anemia    Risks, benefits, limitations, and alternatives regarding endoscopy have been reviewed with the patient.  Questions have been answered.  All parties agreeable.   Jonathon Bellows, MD  02/22/2018, 12:53 PM

## 2018-02-22 NOTE — Anesthesia Preprocedure Evaluation (Signed)
Anesthesia Evaluation  Patient identified by MRN, date of birth, ID band Patient awake    Reviewed: Allergy & Precautions, H&P , NPO status , Patient's Chart, lab work & pertinent test results  History of Anesthesia Complications Negative for: history of anesthetic complications  Airway Mallampati: III  TM Distance: <3 FB Neck ROM: limited    Dental  (+) Chipped, Poor Dentition, Missing, Upper Dentures   Pulmonary shortness of breath and with exertion, former smoker,           Cardiovascular Exercise Tolerance: Good hypertension, (-) angina+ Peripheral Vascular Disease  (-) Past MI and (-) DOE      Neuro/Psych negative neurological ROS  negative psych ROS   GI/Hepatic negative GI ROS, Neg liver ROS,   Endo/Other  diabetes, Type 2, Insulin Dependent  Renal/GU Renal InsufficiencyRenal disease  negative genitourinary   Musculoskeletal  (+) Arthritis ,   Abdominal   Peds  Hematology negative hematology ROS (+) anemia ,   Anesthesia Other Findings Patient is NPO appropriate and reports no nausea or vomiting today.   Past Medical History: No date: Cancer [facility])     Comment:  breast cancer right mastectomy No date: Diabetes mellitus without complication ([facility]) No date: Glaucoma No date: Hyperlipidemia No date: Hypertension No date: Neuropathy No date: Osteoarthritis No date: Rheumatoid arthritis ([place]) No date: Telangiectasias  Past Surgical History: No date: BREAST SURGERY     Comment:  right mastectomy No date: CHOLECYSTECTOMY No date: COLONOSCOPY 08/14/2016: COLONOSCOPY WITH PROPOFOL; N/A     Comment:  Procedure: COLONOSCOPY WITH PROPOFOL;  Surgeon: Lollie Sails, MD;  Location: [facility] ENDOSCOPY;  Service:               Endoscopy;  Laterality: N/A; 08/14/2016: ESOPHAGOGASTRODUODENOSCOPY (EGD) WITH PROPOFOL; N/A     Comment:  Procedure: ESOPHAGOGASTRODUODENOSCOPY (EGD) WITH   PROPOFOL;  Surgeon: Lollie Sails, MD;  Location:               [facility] ENDOSCOPY;  Service: Endoscopy;  Laterality: N/A; No date: EXCISIONAL HEMORRHOIDECTOMY  BMI    Body Mass Index:  31.59 kg/m      Reproductive/Obstetrics negative OB ROS                             Anesthesia Physical  Anesthesia Plan  ASA: III  Anesthesia Plan: General   Post-op Pain Management:    Induction: Intravenous  PONV Risk Score and Plan: Propofol infusion and TIVA  Airway Management Planned: Natural Airway and Nasal Cannula  Additional Equipment:   Intra-op Plan:   Post-operative Plan:   Informed Consent: I have reviewed the patients History and Physical, chart, labs and discussed the procedure including the risks, benefits and alternatives for the proposed anesthesia with the patient or authorized representative who has indicated his/her understanding and acceptance.   Dental Advisory Given  Plan Discussed with: Anesthesiologist, CRNA and Surgeon  Anesthesia Plan Comments: (Patient consented for risks of anesthesia including but not limited to:  - adverse reactions to medications - risk of intubation if required - damage to teeth, lips or other oral mucosa - sore throat or hoarseness - Damage to heart, brain, lungs or loss of life  Patient voiced understanding.)        Anesthesia Quick Evaluation

## 2018-02-22 NOTE — Op Note (Signed)
[facility] Gastroenterology Patient Name: Emily Faulkner Procedure Date: 02/22/2018 11:11 AM MRN: [id number] Account #: [id number] Date of Birth: 1941/05/08 Admit Type: Inpatient Age: 77 Room: [facility] ENDO ROOM 4 Gender: Female Note Status: Finalized Procedure:            Small bowel enteroscopy Indications:          Iron deficiency anemia Providers:            Jonathon Bellows MD, MD Referring MD:         Sofie Hartigan (Referring MD) Medicines:            Monitored Anesthesia Care Complications:        No immediate complications. Procedure:            Pre-Anesthesia Assessment:                       - Prior to the procedure, a History and Physical was                        performed, and patient medications, allergies and                        sensitivities were reviewed. The patient's tolerance of                        previous anesthesia was reviewed.                       - The risks and benefits of the procedure and the                        sedation options and risks were discussed with the                        patient. All questions were answered and informed                        consent was obtained.                       - ASA Grade Assessment: III - A patient with severe                        systemic disease.                       After obtaining informed consent, the endoscope was                        passed under direct vision. Throughout the procedure,                        the patient's blood pressure, pulse, and oxygen                        saturations were monitored continuously.After obtaining                        informed consent, the endoscope was passed under direct  vision. Throughout the procedure, the patient's blood                        pressure, pulse, and oxygen saturations were monitored                        continuously. The was introduced through the mouth, and                        advanced to  the proximal jejunum. The small bowel                        enteroscopy was accomplished with ease. The patient                        tolerated the procedure well. Findings:      Three angioectasias with no bleeding were found in the proximal jejunum.       Coagulation for bleeding prevention using argon plasma at 0.5       liters/minute and 20 watts was successful.      The esophagus was normal.      The stomach was normal. Impression:           - Three non-bleeding angioectasias in the jejunum.                        Treated with argon plasma coagulation (APC).                       - Normal esophagus.                       - Normal stomach.                       - No specimens collected. Recommendation:       - Perform a colonoscopy today. Procedure Code(s):    --- Professional ---                       [phone], Small intestinal endoscopy, enteroscopy beyond                        second portion of duodenum, not including ileum; with                        control of bleeding (eg, injection, bipolar cautery,                        unipolar cautery, laser, heater probe, stapler, plasma                        coagulator) Diagnosis Code(s):    --- Professional ---                       I50.38, Angiodysplasia of colon without hemorrhage                       D50.9, Iron deficiency anemia, unspecified CPT copyright 2017 American Medical Association. All rights reserved. The codes documented in this report are preliminary and upon coder review may  be revised to meet current compliance requirements. Jonathon Bellows,  MD Jonathon Bellows MD, MD 02/22/2018 1:14:57 PM This report has been signed electronically. Number of Addenda: 0 Note Initiated On: 02/22/2018 11:11 AM      [facility]

## 2018-02-22 NOTE — OR Nursing (Signed)
Argon straight fire during upper endoscopy for AVM.

## 2018-02-22 NOTE — Progress Notes (Signed)
[place] at [facility] NAME: Emily Faulkner    MR#:  [id number]  DATE OF BIRTH:  Nov 05, 1940  SUBJECTIVE:  CHIEF COMPLAINT: Is reporting generalized weakness, denies any hematemesis or melena.  Awaiting for procedures today  in the emergency department stool for occult blood is positive and hemoglobin was at 6.4.    REVIEW OF SYSTEMS:  CONSTITUTIONAL: No fever, fatigue or weakness.  EYES: No blurred or double vision.  EARS, NOSE, AND THROAT: No tinnitus or ear pain.  RESPIRATORY: No cough, shortness of breath, wheezing or hemoptysis.  CARDIOVASCULAR: No chest pain, orthopnea, edema.  GASTROINTESTINAL: No nausea, vomiting, diarrhea or abdominal pain.  GENITOURINARY: No dysuria, hematuria.  ENDOCRINE: No polyuria, nocturia,  HEMATOLOGY: No anemia, easy bruising or bleeding SKIN: No rash or lesion. MUSCULOSKELETAL: No joint pain or arthritis.   NEUROLOGIC: No tingling, numbness, weakness.  PSYCHIATRY: No anxiety or depression.   DRUG ALLERGIES:  No Known Allergies  VITALS:  Blood pressure 129/60, pulse 97, temperature 98.6 F (37 C), resp. rate 18, height 5' 4"  (1.626 m), weight 84.4 kg, SpO2 96 %.  PHYSICAL EXAMINATION:  GENERAL:  77 y.o.-year-old patient lying in the bed with no acute distress.  EYES: Pupils equal, round, reactive to light and accommodation. No scleral icterus. Extraocular muscles intact.  HEENT: Head atraumatic, normocephalic. Oropharynx and nasopharynx clear.  NECK:  Supple, no jugular venous distention. No thyroid enlargement, no tenderness.  LUNGS: Normal breath sounds bilaterally, no wheezing, rales,rhonchi or crepitation. No use of accessory muscles of respiration.  CARDIOVASCULAR: S1, S2 normal. No murmurs, rubs, or gallops.  ABDOMEN: Soft, nontender, nondistended. Bowel sounds present. No organomegaly or mass.  EXTREMITIES: No pedal edema, cyanosis, or clubbing.  NEUROLOGIC: Cranial nerves II through XII  are intact. Muscle strength 5/5 in all extremities. Sensation intact. Gait not checked.  PSYCHIATRIC: The patient is alert and oriented x 3.  SKIN: No obvious rash, lesion, or ulcer.    LABORATORY PANEL:   CBC Recent Labs  Lab 02/22/18 0437  WBC 5.4  HGB 7.1*  HCT 21.5*  PLT 215   ------------------------------------------------------------------------------------------------------------------  Chemistries  Recent Labs  Lab 02/20/18 1429  02/22/18 0437  NA 133*   < > 142  K 4.4   < > 4.4  CL 98   < > 110  CO2 25   < > 26  GLUCOSE 240*   < > 144*  BUN 39*   < > 28*  CREATININE 2.06*   < > 1.73*  CALCIUM 8.8*   < > 8.8*  AST 34  --   --   ALT 19  --   --   ALKPHOS 60  --   --   BILITOT 0.8  --   --    < > = values in this interval not displayed.   ------------------------------------------------------------------------------------------------------------------  Cardiac Enzymes No results for input(s): TROPONINI in the last 168 hours. ------------------------------------------------------------------------------------------------------------------  RADIOLOGY:  No results found.  EKG:   Orders placed or performed during the hospital encounter of 02/20/18  . ED EKG  . ED EKG    ASSESSMENT AND PLAN:   Symptomatic anemia due to GI bleeding  n.p.o. except medication PPI IV twice daily, IV fluid support Patient has received a blood transfusion follow-up hemoglobin 6.4-7.4,7.6-7.1 Patient had EGD and colonoscopy in February 2018 which is revealed diverticulosis.  EGD has revealed esophagitis, gastritis and duodenitis patient sees Dr. Alice Reichert as an outpatient, just had a EGD in  July 2019 non-obstructing Schatzki ring was found in the lower third of the esophagus -Seen by GI during this admission and recommending EGD/push enteroscopy and colonoscopy today, patient is awaiting for procedures -Discontinue oral iron supplements while inpatient -Iron studies with ferritin  at 17 and iron 20 We will start IV iron supplementation  ARF on CKD stage III.  Hold Lasix and lisinopril, IV fluids support. Creatinine 2.06-1.92--1.73  Hypertension.  Controlled, hold Lasix and lisinopril for now.  Hyperlipidemia continue statin  History of Breast cancer outpatient follow-up with oncology as recommended   All the records are reviewed and case discussed with Care Management/Social Workerr. Management plans discussed with the patient, family and they are in agreement.  CODE STATUS: fc   TOTAL TIME TAKING CARE OF THIS PATIENT: 35 minutes.   POSSIBLE D/C IN 1-2  DAYS, DEPENDING ON CLINICAL CONDITION.  Note: This dictation was prepared with Dragon dictation along with smaller phrase technology. Any transcriptional errors that result from this process are unintentional.   Nicholes Mango M.D on 02/22/2018 at 2:57 PM  Between 7am to 6pm - Pager - [phone] After 6pm go to [email or web address] - password [email or web address] [facility]  [facility]  Office  [phone]  CC: Primary care physician; Sofie Hartigan, MD

## 2018-02-22 NOTE — OR Nursing (Signed)
Transporter here to take pt back to her room.

## 2018-02-22 NOTE — OR Nursing (Signed)
Right colon AVM, straight fire used.

## 2018-02-22 NOTE — Anesthesia Post-op Follow-up Note (Signed)
Anesthesia QCDR form completed.        

## 2018-02-22 NOTE — Op Note (Signed)
[facility] Gastroenterology Patient Name: Emily Faulkner Procedure Date: 02/22/2018 11:08 AM MRN: [id number] Account #: [id number] Date of Birth: June 29, 1941 Admit Type: Inpatient Age: 77 Room: [facility] ENDO ROOM 4 Gender: Female Note Status: Finalized Procedure:            Colonoscopy Indications:          Iron deficiency anemia Providers:            Jonathon Bellows MD, MD Medicines:            Monitored Anesthesia Care Complications:        No immediate complications. Procedure:            Pre-Anesthesia Assessment:                       - Prior to the procedure, a History and Physical was                        performed, and patient medications, allergies and                        sensitivities were reviewed. The patient's tolerance of                        previous anesthesia was reviewed.                       - The risks and benefits of the procedure and the                        sedation options and risks were discussed with the                        patient. All questions were answered and informed                        consent was obtained.                       - ASA Grade Assessment: III - A patient with severe                        systemic disease.                       After obtaining informed consent, the colonoscope was                        passed under direct vision. Throughout the procedure,                        the patient's blood pressure, pulse, and oxygen                        saturations were monitored continuously. The                        Colonoscope was introduced through the anus and                        advanced to the the cecum, identified by the  appendiceal orifice, IC valve and transillumination.                        The colonoscopy was performed without difficulty. The                        patient tolerated the procedure well. The quality of                        the bowel preparation was  fair. Findings:      The perianal and digital rectal examinations were normal.      Multiple medium-sized angioectasias with bleeding were found in the       ascending colon and in the cecum. Coagulation for hemostasis using argon       plasma at 0.5 liters/minute and 20 watts was successful. Atleast 10       AVM's were ablated      The exam was otherwise without abnormality on direct and retroflexion       views. Impression:           - Preparation of the colon was fair.                       - Multiple bleeding colonic angioectasias. Treated with                        argon plasma coagulation (APC).                       - The examination was otherwise normal on direct and                        retroflexion views.                       - No specimens collected. Recommendation:       - Return patient to hospital ward for ongoing care.                       - Advance diet as tolerated.                       - Continue present medications.                       - 1. F/u with Dr Alice Reichert as an outpatient                       2. Capsule study as an outpatient                       3. Repeat colonoscopy with 2 day prep as she had a lot                        of liquid stool oscuring visualization                       4. Do not check stool occult as bleeding from AVM's are                        intermitternt and a negative test does not rule out  intermittent bleeds Procedure Code(s):    --- Professional ---                       [phone], Colonoscopy, flexible; with control of bleeding,                        any method Diagnosis Code(s):    --- Professional ---                       K55.21, Angiodysplasia of colon with hemorrhage                       D50.9, Iron deficiency anemia, unspecified CPT copyright 2017 American Medical Association. All rights reserved. The codes documented in this report are preliminary and upon coder review may  be revised to meet  current compliance requirements. Jonathon Bellows, MD Jonathon Bellows MD, MD 02/22/2018 1:44:00 PM This report has been signed electronically. Number of Addenda: 0 Note Initiated On: 02/22/2018 11:08 AM Scope Withdrawal Time: 0 hours 12 minutes 30 seconds  Total Procedure Duration: 0 hours 22 minutes 43 seconds       [facility]

## 2018-02-22 NOTE — Transfer of Care (Signed)
Immediate Anesthesia Transfer of Care Note  Patient: Emily Faulkner  Procedure(s) Performed: ESOPHAGOGASTRODUODENOSCOPY (EGD) WITH PROPOFOL (N/A ) COLONOSCOPY WITH PROPOFOL (N/A )  Patient Location: PACU  Anesthesia Type:General  Level of Consciousness: awake, alert  and oriented  Airway & Oxygen Therapy: Patient Spontanous Breathing  Post-op Assessment: Report given to RN  Post vital signs: Reviewed and stable  Last Vitals:  Vitals Value Taken Time  BP 119/67 02/22/2018  1:39 PM  Temp 37 C 02/22/2018  1:39 PM  Pulse 98 02/22/2018  1:39 PM  Resp 16 02/22/2018  1:39 PM  SpO2 99 % 02/22/2018  1:39 PM    Last Pain:  Vitals:   02/22/18 0857  TempSrc:   PainSc: Asleep      Patients Stated Pain Goal: 0 (87/18/36 7255)  Complications: No apparent anesthesia complications

## 2018-02-23 ENCOUNTER — Inpatient Hospital Stay: Payer: Medicare HMO

## 2018-02-23 LAB — CBC
HCT: 20.4 % — ABNORMAL LOW (ref 35.0–47.0)
Hemoglobin: 6.6 g/dL — ABNORMAL LOW (ref 12.0–16.0)
MCH: 33.1 pg (ref 26.0–34.0)
MCHC: 32.3 g/dL (ref 32.0–36.0)
MCV: 102.4 fL — ABNORMAL HIGH (ref 80.0–100.0)
Platelets: 212 10*3/uL (ref 150–440)
RBC: 1.99 MIL/uL — ABNORMAL LOW (ref 3.80–5.20)
RDW: 18.1 % — AB (ref 11.5–14.5)
WBC: 6.8 10*3/uL (ref 3.6–11.0)

## 2018-02-23 LAB — URINALYSIS, COMPLETE (UACMP) WITH MICROSCOPIC
BACTERIA UA: NONE SEEN
BILIRUBIN URINE: NEGATIVE
GLUCOSE, UA: 150 mg/dL — AB
HGB URINE DIPSTICK: NEGATIVE
Ketones, ur: NEGATIVE mg/dL
NITRITE: NEGATIVE
PROTEIN: NEGATIVE mg/dL
Specific Gravity, Urine: 1.013 (ref 1.005–1.030)
pH: 5 (ref 5.0–8.0)

## 2018-02-23 LAB — PREPARE RBC (CROSSMATCH)

## 2018-02-23 LAB — GLUCOSE, CAPILLARY
GLUCOSE-CAPILLARY: 163 mg/dL — AB (ref 70–99)
GLUCOSE-CAPILLARY: 143 mg/dL — AB (ref 70–99)
GLUCOSE-CAPILLARY: 117 mg/dL — AB (ref 70–99)
Glucose-Capillary: 195 mg/dL — ABNORMAL HIGH (ref 70–99)

## 2018-02-23 LAB — HEMOGLOBIN: HEMOGLOBIN: 7.9 g/dL — AB (ref 12.0–16.0)

## 2018-02-23 MED ORDER — FERROUS FUMARATE 324 (106 FE) MG PO TABS
1.0000 | ORAL_TABLET | Freq: Two times a day (BID) | ORAL | Status: DC
  Administered 2018-02-23 – 2018-02-26 (×6): 106 mg via ORAL
  Filled 2018-02-23 (×9): qty 1

## 2018-02-23 MED ORDER — SODIUM CHLORIDE 0.9 % IV SOLN
1.0000 g | INTRAVENOUS | Status: DC
  Administered 2018-02-23: 1 g via INTRAVENOUS
  Filled 2018-02-23: qty 1
  Filled 2018-02-23: qty 10

## 2018-02-23 MED ORDER — SODIUM CHLORIDE 0.9% IV SOLUTION
Freq: Once | INTRAVENOUS | Status: AC
  Administered 2018-02-23: 22:00:00 via INTRAVENOUS

## 2018-02-23 MED ORDER — SODIUM CHLORIDE 0.9 % IV SOLN
INTRAVENOUS | Status: DC
  Administered 2018-02-23: 17:00:00 via INTRAVENOUS

## 2018-02-23 NOTE — Progress Notes (Signed)
Jonathon Bellows , MD [address] [place], [place], [place], [address] [address], [place], [place], [place], [address] Phone: [phone]  Fax: [phone]   Emily Faulkner is being followed for iron deficiency anemia  Day 1 of follow up   Subjective: No new concerns, no pain    Objective: Vital signs in last 24 hours: Vitals:   02/22/18 1359 02/22/18 2044 02/23/18 0449 02/23/18 1138  BP: 129/60 124/61 134/64 125/66  Pulse: 97 89 (!) 110 98  Resp: 18 16 (!) 24 19  Temp:  98.2 F (36.8 C) 98.8 F (37.1 C) 98.4 F (36.9 C)  TempSrc:  Oral Oral Oral  SpO2: 96% 96% 91% 93%  Weight:      Height:       Weight change:   Intake/Output Summary (Last 24 hours) at 02/23/2018 1257 Last data filed at 02/23/2018 1016 Gross per 24 hour  Intake 2013.7 ml  Output 0 ml  Net 2013.7 ml     Exam: Heart:: Regular rate and rhythm, S1S2 present or without murmur or extra heart sounds Lungs: normal, clear to auscultation and clear to auscultation and percussion Abdomen: soft, nontender, normal bowel sounds   Lab Results: @LABTEST2 @ Micro Results: No results found for this or any previous visit (from the past 240 hour(s)). Studies/Results: No results found. Medications: I have reviewed the patient's current medications. Scheduled Meds: . cholecalciferol  2,000 Units Oral Daily  . Ferrous Fumarate  1 tablet Oral BID  . gabapentin  600 mg Oral TID  . hydrOXYzine  10 mg Oral QHS  . insulin aspart  0-5 Units Subcutaneous QHS  . insulin aspart  0-9 Units Subcutaneous TID WC  . loratadine  10 mg Oral Daily  . magnesium citrate  1 Bottle Oral Once  . magnesium oxide  800 mg Oral Daily  . niacin  500 mg Oral QPM  . simvastatin  20 mg Oral QHS  . vitamin B-12  1,000 mcg Oral Daily   Continuous Infusions: . famotidine (PEPCID) IV Stopped (02/22/18 1826)  . iron sucrose Stopped (02/22/18 1746)   PRN Meds:.acetaminophen **OR** acetaminophen, albuterol, bisacodyl,  HYDROcodone-acetaminophen, ondansetron **OR** ondansetron (ZOFRAN) IV, senna-docusate  CBC Latest Ref Rng & Units 02/23/2018 02/22/2018 02/21/2018  WBC 3.6 - 11.0 K/uL - 5.4 -  Hemoglobin 12.0 - 16.0 g/dL 7.9(L) 7.1(L) 7.5(L)  Hematocrit 35.0 - 47.0 % - 21.5(L) 23.0(L)  Platelets 150 - 440 K/uL - 215 -    Assessment: Active Problems:   GIB (gastrointestinal bleeding)  Emily Faulkner 77 y.o. female being followed for iron deficiency anemia.  1. 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 Colonoscopy yesterday showed: multiple bleeding Rt colon and cecal AVM;s- treated with APC Hb stable   Plan: 1. Outpatient capsule study of the small bowel with [facility] . Do not check stool occult as an outpatient as it is not a test to determine small bowel bleeds from AVM's, . Repeat colonoscopy in a few months as her prep was very poor and she may very well have more AVM's of the colon .   2. IV iron and follow up with Hematology as an outpatient for IV iron while GI ablates all AVM's.    I will sign off.  Please call me if any further GI concerns or questions.  We would like to thank you for the opportunity to participate in the care of Emily Faulkner.   LOS: 3 days   Jonathon Bellows,  MD 02/23/2018, 12:57 PM

## 2018-02-23 NOTE — Progress Notes (Signed)
Emily Faulkner at [place] NAME: Emily Faulkner    MR#:  [id number]  DATE OF BIRTH:  September 30, 1940  SUBJECTIVE:  Is reporting generalized weakness, denies any hematemesis or melena.   in the emergency department stool for occult blood is positive and hemoglobin was at 6.4. Received 1 unit BT at admission Tolerating po diet  REVIEW OF SYSTEMS:  CONSTITUTIONAL: No fever, fatigue or weakness.  EYES: No blurred or double vision.  EARS, NOSE, AND THROAT: No tinnitus or ear pain.  RESPIRATORY: No cough, shortness of breath, wheezing or hemoptysis.  CARDIOVASCULAR: No chest pain, orthopnea, edema.  GASTROINTESTINAL: No nausea, vomiting, diarrhea or abdominal pain.  GENITOURINARY: No dysuria, hematuria.  ENDOCRINE: No polyuria, nocturia,  HEMATOLOGY: No anemia, easy bruising or bleeding SKIN: No rash or lesion. MUSCULOSKELETAL: No joint pain or arthritis.   NEUROLOGIC: No tingling, numbness, weakness.  PSYCHIATRY: No anxiety or depression.   DRUG ALLERGIES:  No Known Allergies  VITALS:  Blood pressure 134/64, pulse (!) 110, temperature 98.8 F (37.1 C), temperature source Oral, resp. rate (!) 24, height 5' 4"  (1.626 m), weight 84.4 kg, SpO2 91 %.  PHYSICAL EXAMINATION:  GENERAL:  77 y.o.-year-old patient lying in the bed with no acute distress. Pallor+ EYES: Pupils equal, round, reactive to light and accommodation. No scleral icterus. Extraocular muscles intact.  HEENT: Head atraumatic, normocephalic. Oropharynx and nasopharynx clear.  NECK:  Supple, no jugular venous distention. No thyroid enlargement, no tenderness.  LUNGS: Normal breath sounds bilaterally, no wheezing, rales,rhonchi or crepitation. No use of accessory muscles of respiration.  CARDIOVASCULAR: S1, S2 normal. No murmurs, rubs, or gallops.  ABDOMEN: Soft, nontender, nondistended. Bowel sounds present. No organomegaly or mass.  EXTREMITIES: No pedal edema, cyanosis, or clubbing.   NEUROLOGIC: Cranial nerves II through XII are intact. Muscle strength 5/5 in all extremities. Sensation intact. Gait not checked.  PSYCHIATRIC: The patient is alert and oriented x 3.  SKIN: No obvious rash, lesion, or ulcer.    LABORATORY PANEL:   CBC Recent Labs  Lab 02/22/18 0437  WBC 5.4  HGB 7.1*  HCT 21.5*  PLT 215   ------------------------------------------------------------------------------------------------------------------  Chemistries  Recent Labs  Lab 02/20/18 1429  02/22/18 0437  NA 133*   < > 142  K 4.4   < > 4.4  CL 98   < > 110  CO2 25   < > 26  GLUCOSE 240*   < > 144*  BUN 39*   < > 28*  CREATININE 2.06*   < > 1.73*  CALCIUM 8.8*   < > 8.8*  AST 34  --   --   ALT 19  --   --   ALKPHOS 60  --   --   BILITOT 0.8  --   --    < > = values in this interval not displayed.   ------------------------------------------------------------------------------------------------------------------  Cardiac Enzymes No results for input(s): TROPONINI in the last 168 hours. ------------------------------------------------------------------------------------------------------------------  RADIOLOGY:  No results found.  EKG:   Orders placed or performed during the hospital encounter of 02/20/18  . ED EKG  . ED EKG    ASSESSMENT AND PLAN:  [name]  is a 77 y.o. female with a known history of hypertension, hyperlipidemia, diabetes, CKD, anemia, neuropathy, osteoarthritis, breast cancer.  The patient presents the ED with above chief complaints.  She also complains of melena   *Symptomatic anemia due to GI bleeding from Colonic AVM's s/p cauterization -Patient has  received a blood transfusion follow-up hemoglobin 6.4-7.4,7.6-7.1--hgb today -Patient had EGD and colonoscopy in February 2018 which is revealed diverticulosis.  - EGD has revealed esophagitis, gastritis and duodenitis patient sees Dr. Alice Reichert as an outpatient, just had a EGD in July 2019  non-obstructing Schatzki ring was found in the lower third of the esophagus -Seen by GI during this admission and recommending  -Colonoscopy showed -- Multiple bleeding colonic angioectasias. Treated with  argon plasma coagulation (APC -Iron studies with ferritin at 17 and iron 20 -received two doses of IV iron. Will continue her oral supplements at discharge.  *ARF on CKD stage III.  Hold Lasix and lisinopril, IV fluids support. Creatinine 2.06-1.92--1.73  *Hypertension.  Controlled, hold Lasix and lisinopril for now--will resume at discharge. Creat better  *Hyperlipidemia continue statin  *History of Breast cancer outpatient follow-up with oncology as recommended  Ambulate in the hallways with RN  Will d/c home if hgb is stable  All the records are reviewed and case discussed with Care Management/Social Workerr. Management plans discussed with the patient, family and they are in agreement.  CODE STATUS: fc   TOTAL TIME TAKING CARE OF THIS PATIENT: 25 minutes.   POSSIBLE D/C  TO DAY, DEPENDING ON CLINICAL CONDITION.  Note: This dictation was prepared with Dragon dictation along with smaller phrase technology. Any transcriptional errors that result from this process are unintentional.   Fritzi Mandes M.D on 02/23/2018 at 9:06 AM  Between 7am to 6pm - Pager - [phone] After 6pm go to [email or web address] - password [email or web address] [facility]  [facility]  Office  [phone]  CC: Primary care physician; Sofie Hartigan, MD

## 2018-02-24 ENCOUNTER — Inpatient Hospital Stay: Payer: Medicare HMO

## 2018-02-24 LAB — TYPE AND SCREEN
ABO/RH(D): O POS
ABO/RH(D): O POS
Antibody Screen: NEGATIVE
Antibody Screen: NEGATIVE
UNIT DIVISION: 0
Unit division: 0

## 2018-02-24 LAB — BPAM RBC
BLOOD PRODUCT EXPIRATION DATE: 201908242359
Blood Product Expiration Date: 201909072359
ISSUE DATE / TIME: 201908152118
ISSUE DATE / TIME: 201908182206
UNIT TYPE AND RH: 5100
UNIT TYPE AND RH: 5100

## 2018-02-24 LAB — GLUCOSE, CAPILLARY
GLUCOSE-CAPILLARY: 203 mg/dL — AB (ref 70–99)
GLUCOSE-CAPILLARY: 192 mg/dL — AB (ref 70–99)
Glucose-Capillary: 171 mg/dL — ABNORMAL HIGH (ref 70–99)
Glucose-Capillary: 177 mg/dL — ABNORMAL HIGH (ref 70–99)

## 2018-02-24 LAB — HEMOGLOBIN AND HEMATOCRIT, BLOOD
HCT: 26.7 % — ABNORMAL LOW (ref 35.0–47.0)
Hemoglobin: 8.8 g/dL — ABNORMAL LOW (ref 12.0–16.0)

## 2018-02-24 MED ORDER — LATANOPROST 0.005 % OP SOLN
1.0000 [drp] | Freq: Every day | OPHTHALMIC | Status: DC
  Administered 2018-02-24 – 2018-02-25 (×2): 1 [drp] via OPHTHALMIC
  Filled 2018-02-24: qty 2.5

## 2018-02-24 MED ORDER — IPRATROPIUM-ALBUTEROL 0.5-2.5 (3) MG/3ML IN SOLN
3.0000 mL | Freq: Once | RESPIRATORY_TRACT | Status: AC
  Administered 2018-02-24: 3 mL via RESPIRATORY_TRACT
  Filled 2018-02-24: qty 3

## 2018-02-24 MED ORDER — FUROSEMIDE 10 MG/ML IJ SOLN
20.0000 mg | Freq: Once | INTRAMUSCULAR | Status: AC
  Administered 2018-02-24: 20 mg via INTRAVENOUS
  Filled 2018-02-24: qty 4

## 2018-02-24 MED ORDER — CEPHALEXIN 500 MG PO CAPS
500.0000 mg | ORAL_CAPSULE | Freq: Two times a day (BID) | ORAL | Status: DC
  Administered 2018-02-24 – 2018-02-26 (×5): 500 mg via ORAL
  Filled 2018-02-24 (×5): qty 1

## 2018-02-24 NOTE — NC FL2 (Signed)
[place] LEVEL OF CARE SCREENING TOOL     IDENTIFICATION  Patient Name: Emily Faulkner Birthdate: Apr 09, 1941 Sex: female Admission Date (Current Location): 02/20/2018  [facility] and [place] Number:  Engineering geologist and Address:  [facility], [address] [place], [place] [address]      Provider Number: [id number]  Attending Physician Name and Address:  Fritzi Mandes, MD  Relative Name and Phone Number:       Current Level of Care: Hospital Recommended Level of Care: [place] Prior Approval Number:    Date Approved/Denied:   PASRR Number:    Discharge Plan: SNF    Current Diagnoses: Patient Active Problem List   Diagnosis Date Noted  . GIB (gastrointestinal bleeding) 02/20/2018  . Microalbuminuric diabetic nephropathy ([place]) 11/02/2016  . Diabetes mellitus, type 2 ([place]) 11/02/2016  . Diabetic neuropathy, type II diabetes mellitus ([place]) 11/02/2016  . Pulmonary hypertension ([place]) 09/11/2016  . Shortness of breath 09/06/2016  . Hypertension 09/06/2016  . Hyperkalemia 09/06/2016  . High cholesterol 09/06/2016  . Anemia 09/06/2016  . Diabetes mellitus ([place]) 09/06/2016  . Iron deficiency anemia 04/15/2016  . Type 2 diabetes mellitus with neurological manifestations, uncontrolled ([place]) 08/04/2014  . Hyperlipidemia, unspecified 01/22/2014  . Personal history of breast cancer 07/17/2012    Orientation RESPIRATION BLADDER Height & Weight     Self, Place, Situation  Normal Continent Weight: 186 lb (84.4 kg) Height:  5' 4"  (162.6 cm)  BEHAVIORAL SYMPTOMS/MOOD NEUROLOGICAL BOWEL NUTRITION STATUS  (none) (none) Continent Diet(heart healthy/carb modified)  AMBULATORY STATUS COMMUNICATION OF NEEDS Skin   Extensive Assist Verbally Normal                       Personal Care Assistance Level of Assistance  Bathing, Feeding, Dressing Bathing Assistance: Limited assistance Feeding assistance: Limited  assistance Dressing Assistance: Limited assistance     Functional Limitations Info  (none reported)          SPECIAL CARE FACTORS FREQUENCY  PT (By licensed PT)                    Contractures Contractures Info: Not present    Additional Factors Info  Code Status Code Status Info: full             Current Medications (02/24/2018):  This is the current hospital active medication list Current Facility-Administered Medications  Medication Dose Route Frequency Provider Last Rate Last Dose  . acetaminophen (TYLENOL) tablet 650 mg  650 mg Oral Q6H PRN Demetrios Loll, MD   650 mg at 02/23/18 1746   Or  . acetaminophen (TYLENOL) suppository 650 mg  650 mg Rectal Q6H PRN Demetrios Loll, MD      . albuterol (PROVENTIL) (2.5 MG/3ML) 0.083% nebulizer solution 2.5 mg  2.5 mg Nebulization Q2H PRN Demetrios Loll, MD   2.5 mg at 02/24/18 9528  . bisacodyl (DULCOLAX) EC tablet 5 mg  5 mg Oral Daily PRN Demetrios Loll, MD      . cephALEXin [facility]) capsule 500 mg  500 mg Oral Q12H Fritzi Mandes, MD   500 mg at 02/24/18 1022  . cholecalciferol (VITAMIN D) tablet 2,000 Units  2,000 Units Oral Daily Demetrios Loll, MD   2,000 Units at 02/24/18 [phone]  . Ferrous Fumarate (HEMOCYTE - 106 mg FE) tablet 106 mg of iron  1 tablet Oral BID Fritzi Mandes, MD   106 mg of iron at 02/24/18 0953  .  insulin aspart (novoLOG) injection 0-5 Units  0-5 Units Subcutaneous QHS Demetrios Loll, MD      . insulin aspart (novoLOG) injection 0-9 Units  0-9 Units Subcutaneous TID WC Demetrios Loll, MD   3 Units at 02/24/18 1156  . latanoprost (XALATAN) 0.005 % ophthalmic solution 1 drop  1 drop Left Eye Cyndia Diver, Gus Height, MD      . loratadine (CLARITIN) tablet 10 mg  10 mg Oral Daily Demetrios Loll, MD   10 mg at 02/24/18 0954  . magnesium citrate solution 1 Bottle  1 Bottle Oral Once [place], Tally Due, MD      . magnesium oxide (MAG-OX) tablet 800 mg  800 mg Oral Daily Demetrios Loll, MD   800 mg at 02/24/18 0954  . niacin (NIASPAN) CR tablet 500 mg  500  mg Oral QPM Demetrios Loll, MD   500 mg at 02/23/18 1747  . ondansetron (ZOFRAN) tablet 4 mg  4 mg Oral Q6H PRN Demetrios Loll, MD       Or  . ondansetron [facility]) injection 4 mg  4 mg Intravenous Q6H PRN Demetrios Loll, MD   4 mg at 02/21/18 2240  . senna-docusate (Senokot-S) tablet 1 tablet  1 tablet Oral QHS PRN Demetrios Loll, MD      . simvastatin (ZOCOR) tablet 20 mg  20 mg Oral QHS Demetrios Loll, MD   20 mg at 02/22/18 2255  . vitamin B-12 (CYANOCOBALAMIN) tablet 1,000 mcg  1,000 mcg Oral Daily Demetrios Loll, MD   1,000 mcg at 02/24/18 8833     Discharge Medications: Please see discharge summary for a list of discharge medications.  Relevant Imaging Results:  Relevant Lab Results:   Additional Information ss: [id number]  Shela Leff, LCSW

## 2018-02-24 NOTE — Evaluation (Signed)
Physical Therapy Evaluation Patient Details Name: Emily Faulkner MRN: [id number] DOB: 07-10-1940 Today's Date: 02/24/2018   History of Present Illness  presented to ER secondary to generalized weakness, SOB; admitted with symptomatic anemia secondary to GIB (current HgB 8.8).  Hospital stay significant for colonic AVM s/p cauterization.  Clinical Impression  Upon evaluation, patient resting with eyes closed, but opens to therapist voice.  Does require consistent cuing to maintain alertness and eyes open throughout session.  Patient globally weak and deconditioned throughout all extremities, requiring physical assist for all functional activities.  Unable to complete bed mobility, sit/stand, basic transfers or short-distance gait (5') without RW, min/mod assist from therapist.  Poor balance, poor functional endurance (BORG 8/10 after minimal activity).  Unsafe/unable to attempt additional gait distance at this time; unable to demonstrate ability to complete mobility/activity required to facilitate safe discharge home alone. Of note, vitals stable and WFL throughout session; no signs/symtoms of orthostasis; maintains sats >92% on RA. Would benefit from skilled PT to address above deficits and promote optimal return to PLOF; recommend transition to STR upon discharge from acute hospitalization.     Follow Up Recommendations SNF    Equipment Recommendations       Recommendations for Other Services       Precautions / Restrictions Precautions Precautions: Fall Restrictions Weight Bearing Restrictions: No      Mobility  Bed Mobility Overal bed mobility: Needs Assistance Bed Mobility: Supine to Sit     Supine to sit: Min assist;Mod assist     General bed mobility comments: assist for truncal elevation  Transfers Overall transfer level: Needs assistance Equipment used: Rolling walker (2 wheeled) Transfers: Sit to/from Stand Sit to Stand: Mod assist;Min assist         General  transfer comment: assist for lift off, standing balance; requires RW for UE support  Ambulation/Gait Ambulation/Gait assistance: Min assist;Mod assist Gait Distance (Feet): 5 Feet Assistive device: Rolling walker (2 wheeled)       General Gait Details: broad BOS; poor balance, functional endurance; poor balance reactions. BORG 8/10 after 2-3 steps from bed/chair; unable to tolerate additional distance.  Stairs            Wheelchair Mobility    Modified Rankin (Stroke Patients Only)       Balance Overall balance assessment: Needs assistance Sitting-balance support: No upper extremity supported;Feet supported Sitting balance-Leahy Scale: Fair     Standing balance support: Bilateral upper extremity supported Standing balance-Leahy Scale: Poor                               Pertinent Vitals/Pain Pain Assessment: Faces Faces Pain Scale: Hurts little more Pain Location: back Pain Descriptors / Indicators: Aching;Guarding;Grimacing Pain Intervention(s): Limited activity within patient's tolerance;Monitored during session;Repositioned    Home Living Family/patient expects to be discharged to:: Private residence Living Arrangements: Alone   Type of Home: House       Home Layout: Two level;Laundry or work area in basement;Able to live on main level with [employer]: None      Prior Function Level of Independence: Independent         Comments: Indep with ADLs, household and community mobilization without assist device; + driving; denies fall history.     Hand Dominance        Extremity/Trunk Assessment   Upper Extremity Assessment Upper Extremity Assessment: Generalized weakness    Lower Extremity Assessment Lower Extremity Assessment:  Generalized weakness(grossly 3-/5 throughout)       Communication   Communication: No difficulties  Cognition Arousal/Alertness: Awake/alert Behavior During Therapy: WFL for tasks  assessed/performed Overall Cognitive Status: Within Functional Limits for tasks assessed                                        General Comments      Exercises     Assessment/Plan    PT Assessment Patient needs continued PT services  PT Problem List Decreased strength;Decreased activity tolerance;Decreased balance;Decreased mobility;Decreased coordination;Decreased cognition;Decreased knowledge of use of DME;Decreased safety awareness;Decreased knowledge of precautions;Cardiopulmonary status limiting activity       PT Treatment Interventions DME instruction;Gait training;Functional mobility training;Stair training;Therapeutic activities;Therapeutic exercise;Balance training;Patient/family education    PT Goals (Current goals can be found in the Care Plan section)  Acute Rehab PT Goals Patient Stated Goal: agreeable to session PT Goal Formulation: With patient Time For Goal Achievement: 03/10/18 Potential to Achieve Goals: Good    Frequency Min 2X/week   Barriers to discharge Decreased caregiver support      Co-evaluation               AM-PAC PT "6 Clicks" Daily Activity  Outcome Measure Difficulty turning over in bed (including adjusting bedclothes, sheets and blankets)?: Unable Difficulty moving from lying on back to sitting on the side of the bed? : Unable Difficulty sitting down on and standing up from a chair with arms (e.g., wheelchair, bedside commode, etc,.)?: Unable Help needed moving to and from a bed to chair (including a wheelchair)?: A Lot Help needed walking in hospital room?: A Lot Help needed climbing 3-5 steps with a railing? : Total 6 Click Score: 8    End of Session Equipment Utilized During Treatment: Gait belt Activity Tolerance: Patient tolerated treatment well;Patient limited by fatigue Patient left: in chair;with call bell/phone within reach;with chair alarm set Nurse Communication: Mobility status PT Visit Diagnosis:  Muscle weakness (generalized) (M62.81);Difficulty in walking, not elsewhere classified (R26.2)    Time: 8242-3536 PT Time Calculation (min) (ACUTE ONLY): 30 min   Charges:   PT Evaluation $PT Eval Moderate Complexity: 1 Mod PT Treatments $Therapeutic Activity: 8-22 mins        Atom Solivan H. Owens Shark, PT, DPT, NCS 02/24/18, 11:05 AM [phone]

## 2018-02-24 NOTE — Progress Notes (Signed)
OT Cancellation Note  Patient Details Name: Emily Faulkner MRN: [id number] DOB: 06/09/1941   Cancelled Treatment:    Reason Eval/Treat Not Completed: Fatigue/lethargy limiting ability to participate;Patient declined, no reason specified. Order received, chart reviewed. Upon attempt, pt reports just getting back to bed after toileting with CNA. Pt SOB, requiring max verbal cues to utilize pursed lip breathing On RA, O2 sats 80-81%, HR 110. Applied 2L O2 and with max verbal cues again for PLB, pt O2 sats increased to 91-92% on 2L O2, HR 107. Pt declines OT despite encouragement 2/2 fatigued. RN notified of pt left on O2. Will re-attempt at later date/time as pt is available and medically appropriate.   Jeni Salles, MPH, MS, OTR/L ascom [phone] 02/24/18, 4:08 PM

## 2018-02-24 NOTE — Care Management Important Message (Signed)
Copy of signed IM left with patient in room.  

## 2018-02-24 NOTE — Progress Notes (Signed)
[place] [facility] [place] NAME: Emily Faulkner    MR#:  [id number]  DATE OF BIRTH:  1940/08/11  SUBJECTIVE:  Is reporting generalized weakness, denies any hematemesis or melena.   in the emergency department stool for occult blood is positive and hemoglobin was at 6.4. Received 1 unit BT at admission Tolerating po diet less sleepy and more alert today. No fever. Right upper extremity jerking movement present. Patient states it is chronic however has gotten worse last few days. REVIEW OF SYSTEMS:  CONSTITUTIONAL: No fever, fatigue or weakness.  EYES: No blurred or double vision.  EARS, NOSE, AND THROAT: No tinnitus or ear pain.  RESPIRATORY: No cough, shortness of breath, wheezing or hemoptysis.  CARDIOVASCULAR: No chest pain, orthopnea, edema.  GASTROINTESTINAL: No nausea, vomiting, diarrhea or abdominal pain.  GENITOURINARY: No dysuria, hematuria.  ENDOCRINE: No polyuria, nocturia,  HEMATOLOGY: No anemia, easy bruising or bleeding SKIN: No rash or lesion. MUSCULOSKELETAL: No joint pain or arthritis.   NEUROLOGIC: No tingling, numbness, weakness.  PSYCHIATRY: No anxiety or depression.   DRUG ALLERGIES:  No Known Allergies  VITALS:  Blood pressure (!) 135/55, pulse 96, temperature 99.5 F (37.5 C), temperature source Oral, resp. rate 19, height 5' 4"  (1.626 m), weight 84.4 kg, SpO2 (!) 89 %.  PHYSICAL EXAMINATION:  GENERAL:  77 y.o.-year-old patient lying in the bed with no acute distress. Pallor+ EYES: Pupils equal, round, reactive to light and accommodation. No scleral icterus. Extraocular muscles intact.  HEENT: Head atraumatic, normocephalic. Oropharynx and nasopharynx clear.  NECK:  Supple, no jugular venous distention. No thyroid enlargement, no tenderness.  LUNGS: Normal breath sounds bilaterally, no wheezing, rales,rhonchi or crepitation. No use of accessory muscles of respiration.  CARDIOVASCULAR: S1, S2 normal. No murmurs,  rubs, or gallops.  ABDOMEN: Soft, nontender, nondistended. Bowel sounds present. No organomegaly or mass.  EXTREMITIES: No pedal edema, cyanosis, or clubbing.  NEUROLOGIC: Cranial nerves II through XII are intact. Muscle strength 5/5 in all extremities. Sensation intact. Gait not checked.  PSYCHIATRIC: The patient is alert and oriented x 3.  SKIN: No obvious rash, lesion, or ulcer.    LABORATORY PANEL:   CBC Recent Labs  Lab 02/23/18 1851 02/24/18 0414  WBC 6.8  --   HGB 6.6* 8.8*  HCT 20.4* 26.7*  PLT 212  --    ------------------------------------------------------------------------------------------------------------------  Chemistries  Recent Labs  Lab 02/20/18 1429  02/22/18 0437  NA 133*   < > 142  K 4.4   < > 4.4  CL 98   < > 110  CO2 25   < > 26  GLUCOSE 240*   < > 144*  BUN 39*   < > 28*  CREATININE 2.06*   < > 1.73*  CALCIUM 8.8*   < > 8.8*  AST 34  --   --   ALT 19  --   --   ALKPHOS 60  --   --   BILITOT 0.8  --   --    < > = values in this interval not displayed.   ------------------------------------------------------------------------------------------------------------------  Cardiac Enzymes No results for input(s): TROPONINI in the last 168 hours. ------------------------------------------------------------------------------------------------------------------  RADIOLOGY:  Ct Head Wo Contrast  Result Date: 02/23/2018 CLINICAL DATA:  Altered level of consciousness. EXAM: CT HEAD WITHOUT CONTRAST TECHNIQUE: Contiguous axial images were obtained from the base of the skull through the vertex without intravenous contrast. COMPARISON:  CT head 02/28/2006 FINDINGS: Brain: Generalized atrophy with progression. Chronic  ischemic changes have progressed. Chronic microvascular ischemia in the white matter bilaterally. 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 Negative for acute infarct, hemorrhage, or mass lesion. No midline shift. Vascular:  Negative for hyperdense vessel Skull: Chronic fracture left parietal bone which was noted previously. No acute skeletal abnormality Sinuses/Orbits: Paranasal sinuses clear.  Bilateral cataract surgery Other: None IMPRESSION: Progression of atrophy and chronic ischemia since 2007. No acute abnormality. Electronically Signed   By: Franchot Gallo M.D.   On: 02/23/2018 14:08   Dg Chest Port 1 View  Result Date: 02/24/2018 CLINICAL DATA:  Shortness of breath, cough, melena, anemia. EXAM: PORTABLE CHEST 1 VIEW COMPARISON:  In 10/2017 FINDINGS: Stable top-normal heart size. Stable chronic appearance interstitial lung disease and potential component of chronic interstitial edema and pulmonary venous hypertension. There may be trace bilateral pleural effusions. No focal airspace consolidation, nodule or pneumothorax identified. The bony thorax is unremarkable. IMPRESSION: Stable chronic interstitial lung disease and potential component of chronic interstitial edema. Possible small bilateral pleural effusions. Electronically Signed   By: Aletta Edouard M.D.   On: 02/24/2018 08:43    EKG:   Orders placed or performed during the hospital encounter of 02/20/18  . ED EKG  . ED EKG    ASSESSMENT AND PLAN:  Emily Faulkner  is a 77 y.o. female with a known history of hypertension, hyperlipidemia, diabetes, CKD, anemia, neuropathy, osteoarthritis, breast cancer.  The patient presents the ED with above chief complaints.  She also complains of melena   *Symptomatic anemia due to GI bleeding from Colonic AVM's s/p cauterization -Patient has received a blood transfusion follow-up hemoglobin 6.4-7.4,7.6-7.1--8.8 -Patient had EGD and colonoscopy in February 2018 which is revealed diverticulosis.  - EGD has revealed esophagitis, gastritis and duodenitis patient sees Dr. Alice Reichert as an outpatient, just had a EGD in July 2019 non-obstructing Schatzki ring was found in the lower third of the esophagus -Seen by GI during this  admission and recommending  -Colonoscopy showed -- Multiple bleeding colonic angioectasias. Treated with  argon plasma coagulation (APC -Iron studies with ferritin at 17 and iron 20 -received two doses of IV iron. Will continue her oral supplements at discharge.  *ARF on CKD stage III.  Hold Lasix and lisinopril, IV fluids support. Creatinine 2.06-1.92--1.73  *Hypertension.  Controlled, hold Lasix and lisinopril for now--will resume at discharge. Creat better  *Hyperlipidemia continue statin  *febrile illness with dysuria -po kelfex for 5 days -BC negative  *History of Breast cancer outpatient follow-up with oncology as recommended  *Upper extremity jerking movement more right the left -has had history of jerking movement however it has gotten worse in the last few days which could be precipitated by anemia. sHe is also on high dose of gabapentin which I'm going to discontinue.  Patient is very weak deconditioned and physical therapy has seen patient. Chills benefit from rehab. I spoke with patient's son Kasandra Knudsen over the phone and patient and she is in agreement with it. She lives alone and given her weakness and significant upper extremity involuntary jerking movements I don't feel comfortable her going home at present.    All the records are reviewed and case discussed with Care Management/Social Workerr. Management plans discussed with the patient, family and they are in agreement.  CODE STATUS: fc   TOTAL TIME TAKING CARE OF THIS PATIENT: 25 minutes.   POSSIBLE D/C  T1-2 DEPENDING ON CLINICAL CONDITION.  Note: This dictation was prepared with Dragon dictation along with smaller phrase technology.  Any transcriptional errors that result from this process are unintentional.   Fritzi Mandes M.D on 02/24/2018 at 3:39 PM  Between 7am to 6pm - Pager - [phone] After 6pm go to [email or web address] - password [email or web address] [facility]  [facility]  Office  [phone]  CC: Primary  care physician; Sofie Hartigan, MD

## 2018-02-25 ENCOUNTER — Encounter: Payer: Self-pay | Admitting: Gastroenterology

## 2018-02-25 LAB — HEMOGLOBIN AND HEMATOCRIT, BLOOD
HCT: 30.4 % — ABNORMAL LOW (ref 35.0–47.0)
HEMOGLOBIN: 9.8 g/dL — AB (ref 12.0–16.0)

## 2018-02-25 LAB — GLUCOSE, CAPILLARY
GLUCOSE-CAPILLARY: 237 mg/dL — AB (ref 70–99)
GLUCOSE-CAPILLARY: 215 mg/dL — AB (ref 70–99)
Glucose-Capillary: 313 mg/dL — ABNORMAL HIGH (ref 70–99)
Glucose-Capillary: 111 mg/dL — ABNORMAL HIGH (ref 70–99)

## 2018-02-25 MED ORDER — GLIPIZIDE 10 MG PO TABS
10.0000 mg | ORAL_TABLET | Freq: Every day | ORAL | Status: DC
  Administered 2018-02-25 – 2018-02-26 (×2): 10 mg via ORAL
  Filled 2018-02-25 (×2): qty 1

## 2018-02-25 MED ORDER — TRAMADOL HCL 50 MG PO TABS
50.0000 mg | ORAL_TABLET | Freq: Three times a day (TID) | ORAL | Status: DC | PRN
  Administered 2018-02-25: 50 mg via ORAL

## 2018-02-25 MED ORDER — GUAIFENESIN 100 MG/5ML PO SOLN
5.0000 mL | ORAL | Status: DC | PRN
  Filled 2018-02-25: qty 5

## 2018-02-25 MED ORDER — INSULIN GLARGINE 100 UNIT/ML ~~LOC~~ SOLN
20.0000 [IU] | Freq: Every day | SUBCUTANEOUS | Status: DC
  Administered 2018-02-25 – 2018-02-26 (×2): 20 [IU] via SUBCUTANEOUS
  Filled 2018-02-25 (×3): qty 0.2

## 2018-02-25 MED ORDER — ENSURE ENLIVE PO LIQD
237.0000 mL | Freq: Two times a day (BID) | ORAL | Status: DC
  Administered 2018-02-26: 237 mL via ORAL

## 2018-02-25 MED ORDER — PANTOPRAZOLE SODIUM 40 MG PO TBEC
40.0000 mg | DELAYED_RELEASE_TABLET | Freq: Two times a day (BID) | ORAL | Status: DC
  Administered 2018-02-25 – 2018-02-26 (×2): 40 mg via ORAL
  Filled 2018-02-25 (×2): qty 1

## 2018-02-25 MED ORDER — OMEGA-3-ACID ETHYL ESTERS 1 G PO CAPS
1.0000 g | ORAL_CAPSULE | Freq: Every day | ORAL | Status: DC
  Administered 2018-02-25 – 2018-02-26 (×2): 1 g via ORAL
  Filled 2018-02-25 (×2): qty 1

## 2018-02-25 MED ORDER — FUROSEMIDE 40 MG PO TABS
40.0000 mg | ORAL_TABLET | Freq: Two times a day (BID) | ORAL | Status: DC
  Administered 2018-02-25 – 2018-02-26 (×2): 40 mg via ORAL
  Filled 2018-02-25 (×2): qty 1

## 2018-02-25 MED ORDER — FUROSEMIDE 10 MG/ML IJ SOLN
20.0000 mg | Freq: Once | INTRAMUSCULAR | Status: AC
  Administered 2018-02-25: 20 mg via INTRAVENOUS
  Filled 2018-02-25: qty 4

## 2018-02-25 MED ORDER — LISINOPRIL 2.5 MG PO TABS
2.5000 mg | ORAL_TABLET | Freq: Every day | ORAL | Status: DC
  Administered 2018-02-25 – 2018-02-26 (×2): 2.5 mg via ORAL
  Filled 2018-02-25 (×2): qty 1

## 2018-02-25 MED ORDER — TRAMADOL HCL 50 MG PO TABS
50.0000 mg | ORAL_TABLET | Freq: Two times a day (BID) | ORAL | Status: DC | PRN
  Administered 2018-02-25: 50 mg via ORAL
  Filled 2018-02-25 (×2): qty 1

## 2018-02-25 MED ORDER — LINAGLIPTIN 5 MG PO TABS
5.0000 mg | ORAL_TABLET | Freq: Every day | ORAL | Status: DC
  Administered 2018-02-25 – 2018-02-26 (×2): 5 mg via ORAL
  Filled 2018-02-25 (×2): qty 1

## 2018-02-25 NOTE — Progress Notes (Signed)
[place] [facility] at [place] NAME: Emily Faulkner    MR#:  [id number]  DATE OF BIRTH:  July 01, 1941  SUBJECTIVE:  Feels bit better today. No right UE jerkiness. SOB No bloody BM Trying to eat some REVIEW OF SYSTEMS:  CONSTITUTIONAL: No fever, fatigue or weakness.  EYES: No blurred or double vision.  EARS, NOSE, AND THROAT: No tinnitus or ear pain.  RESPIRATORY: No cough, shortness of breath, wheezing or hemoptysis.  CARDIOVASCULAR: No chest pain, orthopnea, edema.  GASTROINTESTINAL: No nausea, vomiting, diarrhea or abdominal pain.  GENITOURINARY: No dysuria, hematuria.  ENDOCRINE: No polyuria, nocturia,  HEMATOLOGY: No anemia, easy bruising or bleeding SKIN: No rash or lesion. MUSCULOSKELETAL: No joint pain or arthritis.   NEUROLOGIC: No tingling, numbness, weakness.  PSYCHIATRY: No anxiety or depression.   DRUG ALLERGIES:  No Known Allergies  VITALS:  Blood pressure (!) 154/71, pulse (!) 106, temperature 98.1 F (36.7 C), temperature source Oral, resp. rate 20, height 5' 4"  (1.626 m), weight 84.4 kg, SpO2 97 %.  PHYSICAL EXAMINATION:  GENERAL:  77 y.o.-year-old patient lying in the bed with no acute distress. Pallor+ EYES: Pupils equal, round, reactive to light and accommodation. No scleral icterus. Extraocular muscles intact.  HEENT: Head atraumatic, normocephalic. Oropharynx and nasopharynx clear.  NECK:  Supple, no jugular venous distention. No thyroid enlargement, no tenderness.  LUNGS: Normal breath sounds bilaterally, no wheezing, rales,rhonchi or crepitation. No use of accessory muscles of respiration.  CARDIOVASCULAR: S1, S2 normal. No murmurs, rubs, or gallops.  ABDOMEN: Soft, nontender, nondistended. Bowel sounds present. No organomegaly or mass.  EXTREMITIES: No pedal edema, cyanosis, or clubbing.  NEUROLOGIC: Cranial nerves II through XII are intact. Muscle strength 5/5 in all extremities. Sensation intact. Gait not checked.   PSYCHIATRIC: The patient is alert and oriented x 3.  SKIN: No obvious rash, lesion, or ulcer.    LABORATORY PANEL:   CBC Recent Labs  Lab 02/23/18 1851  02/25/18 0905  WBC 6.8  --   --   HGB 6.6*   < > 9.8*  HCT 20.4*   < > 30.4*  PLT 212  --   --    < > = values in this interval not displayed.   ------------------------------------------------------------------------------------------------------------------  Chemistries  Recent Labs  Lab 02/20/18 1429  02/22/18 0437  NA 133*   < > 142  K 4.4   < > 4.4  CL 98   < > 110  CO2 25   < > 26  GLUCOSE 240*   < > 144*  BUN 39*   < > 28*  CREATININE 2.06*   < > 1.73*  CALCIUM 8.8*   < > 8.8*  AST 34  --   --   ALT 19  --   --   ALKPHOS 60  --   --   BILITOT 0.8  --   --    < > = values in this interval not displayed.   ------------------------------------------------------------------------------------------------------------------  Cardiac Enzymes No results for input(s): TROPONINI in the last 168 hours. ------------------------------------------------------------------------------------------------------------------  RADIOLOGY:  Dg Chest Port 1 View  Result Date: 02/24/2018 CLINICAL DATA:  Shortness of breath, cough, Faulkner, anemia. EXAM: PORTABLE CHEST 1 VIEW COMPARISON:  In 10/2017 FINDINGS: Stable top-normal heart size. Stable chronic appearance interstitial lung disease and potential component of chronic interstitial edema and pulmonary venous hypertension. There may be trace bilateral pleural effusions. No focal airspace consolidation, nodule or pneumothorax identified. The bony thorax is unremarkable.  IMPRESSION: Stable chronic interstitial lung disease and potential component of chronic interstitial edema. Possible small bilateral pleural effusions. Electronically Signed   By: Aletta Edouard M.D.   On: 02/24/2018 08:43    EKG:   Orders placed or performed during the hospital encounter of 02/20/18  . ED EKG  .  ED EKG    ASSESSMENT AND PLAN:  Emily Faulkner  is a 77 y.o. female with a known history of hypertension, hyperlipidemia, diabetes, CKD, anemia, neuropathy, osteoarthritis, breast cancer.  The patient presents the ED with above chief complaints.  She also complains of Faulkner   *Symptomatic anemia due to GI bleeding from Colonic AVM's s/p cauterization -Patient has received a blood transfusion follow-up hemoglobin 6.4-7.4,7.6-7.1--8.8--9.8 -Patient had EGD and colonoscopy in February 2018 which is revealed diverticulosis.  - EGD has revealed esophagitis, gastritis and duodenitis patient sees Dr. Alice Reichert as an outpatient, just had a EGD in July 2019 non-obstructing Schatzki ring was found in the lower third of the esophagus -Seen by GI during this admission and recommending  -Colonoscopy showed -- Multiple bleeding colonic angioectasias. Treated with  argon plasma coagulation (APC -Iron studies with ferritin at 17 and iron 20 -received two doses of IV iron. Will continue her oral supplements at discharge.  *ARF on CKD stage III.  Hold Lasix and lisinopril, IV fluids support. Creatinine 2.06-1.92--1.73  *Hypertension.  Controlled, hold Lasix and lisinopril for now--will resume at discharge. Creat better  *Hyperlipidemia continue statin  *febrile illness with dysuria -po kelfex for 5 days -BC negative  *History of Breast cancer outpatient follow-up with oncology as recommended  *Upper extremity jerking movement more right the left -has had history of jerking movement however it has gotten worse in the last few days which could be precipitated by anemia. sHe is also on high dose of gabapentin which I'm going to discontinue. -much improved  * SOB with h/o COPD and chronic interstitial lung dz -IV lasix 20 mg x1 Will resume home dose of lasix given h/o cirrhosis and Ascites  Patient is very weak deconditioned and physical therapy has seen patient. She will benefit from rehab. I spoke with  patient's son Kasandra Knudsen over the phone and patient and he is in agreement with it. She lives alone and given her weakness and significant upper extremity involuntary jerking movements I don't feel comfortable her going home at present.   All the records are reviewed and case discussed with Care Management/Social Workerr. Management plans discussed with the patient, family and they are in agreement.  CODE STATUS: fc   TOTAL TIME TAKING CARE OF THIS PATIENT: 25 minutes.   POSSIBLE D/C  T1-2 DEPENDING ON CLINICAL CONDITION.  Note: This dictation was prepared with Dragon dictation along with smaller phrase technology. Any transcriptional errors that result from this process are unintentional.   Fritzi Mandes M.D on 02/25/2018 at 2:21 PM  Between 7am to 6pm - Pager - [phone] After 6pm go to [email or web address] - password [email or web address] [facility]  [facility]  Office  [phone]  CC: Primary care physician; Sofie Hartigan, MD

## 2018-02-25 NOTE — Progress Notes (Signed)
OT Cancellation Note  Patient Details Name: Emily Faulkner MRN: [id number] DOB: Apr 24, 1941   Cancelled Treatment:    Reason Eval/Treat Not Completed: Other (comment). On 2nd attempt this date, nursing staff with pt for pt care. Will re-attempt at later date/time as pt is available, medically appropriate, and as schedule permits.   Jeni Salles, MPH, MS, OTR/L ascom [phone] 02/25/18, 11:47 AM

## 2018-02-25 NOTE — Progress Notes (Signed)
Walked pt to bathroom with O2 monitor on her hand. She was at 15 with [place] at 2L and then I removed the oxygen and she was at 96 we then walked to the bathroom and she stayed at 96. She had a BM so I waited about 5-10 minutes and we walked back to her bed and she was still at 95. So I proceeded to remove her oxygen and I will monitor her throughout the day. She was SOB with exertion but it is not due to her oxygen levels.

## 2018-02-25 NOTE — Evaluation (Signed)
Occupational Therapy Evaluation Patient Details Name: Emily Faulkner MRN: [id number] DOB: Jul 02, 1941 Today's Date: 02/25/2018    History of Present Illness 77yo female presented to ER secondary to generalized weakness, SOB; admitted with symptomatic anemia secondary to GIB (current HgB 8.8).  Hospital stay significant for colonic AVM s/p cauterization.   Clinical Impression   Pt seen for OT evaluation this date. Pt was independent in all ADLs and mobility, living in a 2 story home (basement) alone and independent. Pt on room air t/o session, VSS, O2 sats >93% t/o, improving to 95% with learned pursed lip breathing after instruction and encouragement to utilize. Pt currently requires min-mod assist for LB dressing and bathing, max assist for toileting hygiene and CGA for toilet transfers secondary to poor activity tolerance, generalized weakness, and cardiopulmonary status. Pt tolerated sitting EOB with close supervision and pt/granddaughter educated in energy conservation conservation strategies including pursed lip breathing, activity pacing, home/routines modifications, work simplification, AE/DME, prioritizing of meaningful occupations, and falls prevention. Handout provided. Pt/granddaughter verbalized understanding but would benefit from additional skilled OT services to maximize recall and carryover of learned techniques and facilitate implementation of learned techniques into daily routines. Upon discharge, recommend STR prior to return home.       Follow Up Recommendations  SNF    Equipment Recommendations  Other (comment)(TBD)    Recommendations for Other Services       Precautions / Restrictions Precautions Precautions: Fall Restrictions Weight Bearing Restrictions: No      Mobility Bed Mobility Overal bed mobility: Needs Assistance Bed Mobility: Supine to Sit;Sit to Supine     Supine to sit: Min assist;HOB elevated Sit to supine: Min guard      Transfers Overall  transfer level: Needs assistance Equipment used: 1 person hand held assist Transfers: Sit to/from [employer] Sit to Stand: Min guard;Min assist Stand pivot transfers: Min guard;Min assist            Balance Overall balance assessment: Needs assistance Sitting-balance support: No upper extremity supported;Feet supported Sitting balance-Leahy Scale: Fair     Standing balance support: Single extremity supported Standing balance-Leahy Scale: Poor                             ADL either performed or assessed with clinical judgement   ADL Overall ADL's : Needs assistance/impaired Eating/Feeding: Sitting;Modified independent   Grooming: Sitting;Modified independent   Upper Body Bathing: Sitting;Minimal assistance   Lower Body Bathing: Sit to/from stand;Moderate assistance;Minimal assistance   Upper Body Dressing : Sitting;Minimal assistance   Lower Body Dressing: Sit to/from stand;Moderate assistance;Minimal assistance Lower Body Dressing Details (indicate cue type and reason): pt/gdtr educated in AE for LB dressing to minimize need to bend over and cause SOB Toilet Transfer: BSC;Stand-pivot;Min Psychiatric nurse Details (indicate cue type and reason): +1 Handheld assist to [place] and Hygiene: Sit to/from stand;Maximal assistance Toileting - Clothing Manipulation Details (indicate cue type and reason): while in standing pt required max assist for hygiene and donning brief             Vision Patient Visual Report: No change from baseline       Perception     Praxis      Pertinent Vitals/Pain Pain Assessment: No/denies pain     Hand Dominance Right   Extremity/Trunk Assessment Upper Extremity Assessment Upper Extremity Assessment: Generalized weakness   Lower Extremity Assessment Lower Extremity Assessment: Generalized weakness  Cervical / Trunk Assessment Cervical / Trunk Assessment: Normal    Communication Communication Communication: No difficulties   Cognition Arousal/Alertness: Awake/alert Behavior During Therapy: WFL for tasks assessed/performed Overall Cognitive Status: Within Functional Limits for tasks assessed                                 General Comments: grossly WFL, follows commands, additional time to respond to questions (pt reports feeling SOB; VSS t/o session)   General Comments       Exercises Other Exercises Other Exercises: Pt/gdtr educated in energy conservation strategies including pursed lip breathing, activity pacing, work simplification, AE/DME, home/routines modifications, and falls prevention strategies, handout provided   Shoulder Instructions      Home Living Family/patient expects to be discharged to:: Private residence Living Arrangements: Alone Available Help at Discharge: Family;Available PRN/intermittently Type of Home: House Home Access: Stairs to enter Entrance Stairs-Number of Steps: 2-3 Entrance Stairs-Rails: Right(rail on brick wall) Home Layout: Two level;Laundry or work area in basement;Able to live on main level with bedroom/bathroom     Bathroom Shower/Tub: Gaffer;Door   [employer] Toilet: Standard     Home Equipment: Bedside commode          Prior Functioning/Environment Level of Independence: Independent        Comments: Indep with ADLs, household and community mobilization without assist device; + driving; denies fall history.        OT Problem List: Decreased strength;Decreased activity tolerance;Cardiopulmonary status limiting activity;Impaired balance (sitting and/or standing);Decreased knowledge of use of DME or AE      OT Treatment/Interventions: Self-care/ADL training;Balance training;Therapeutic exercise;Therapeutic activities;Energy conservation;DME and/or AE instruction;Patient/family education    OT Goals(Current goals can be found in the care plan section) Acute Rehab OT  Goals Patient Stated Goal: to go home OT Goal Formulation: With patient/family Time For Goal Achievement: 03/11/18 Potential to Achieve Goals: Good ADL Goals Pt Will Perform Lower Body Dressing: sit to/from stand;with supervision;with adaptive equipment Pt Will Transfer to Toilet: with supervision;ambulating(LRAD for amb, comfort height toilet, using PLB) Additional ADL Goal #1: Pt will utilize learned PLB techniques with <25% verbal cues during ADL tasks, 5/5 opportunities to minimize percieved exertion and SOB. Additional ADL Goal #2: Pt will verbalize plan to implement at least 1 learned ECS to maximize safety and independence while minimizing SOB/exertion.  OT Frequency: Min 1X/week   Barriers to D/C:            Co-evaluation              AM-PAC PT "6 Clicks" Daily Activity     Outcome Measure Help from another person eating meals?: None Help from another person taking care of personal grooming?: None Help from another person toileting, which includes using toliet, bedpan, or urinal?: A Little Help from another person bathing (including washing, rinsing, drying)?: A Lot Help from another person to put on and taking off regular upper body clothing?: A Little Help from another person to put on and taking off regular lower body clothing?: A Lot 6 Click Score: 18   End of Session    Activity Tolerance: Patient tolerated treatment well Patient left: in bed;with call bell/phone within reach;with bed alarm set;with family/visitor present  OT Visit Diagnosis: Other abnormalities of gait and mobility (R26.89);Muscle weakness (generalized) (M62.81)                Time: 8469-6295 OT Time Calculation (min): 34 min  Charges:  OT General Charges $OT Visit: 1 Visit OT Evaluation $OT Eval Moderate Complexity: 1 Mod OT Treatments $Self Care/Home Management : 8-22 mins  Jeni Salles, MPH, MS, OTR/L ascom [phone] 02/25/18, 3:33 PM

## 2018-02-25 NOTE — Progress Notes (Signed)
PT Cancellation Note  Patient Details Name: Emily Faulkner MRN: [id number] DOB: 1941-02-08   Cancelled Treatment:    Reason Eval/Treat Not Completed: Patient declined, no reason specified.  Upon PT arrival pt sitting on edge of bed with visitor present.  Therapist educated pt on different therapy options to improve strength and mobility (including ambulation, ex's sitting edge of bed or ex's in bed) but pt kept stating "I don't think I can do it" and therapist unable to get pt to participate (visitor present also encouraged pt to participate): MD notified  Will re-attempt PT treatment session at a later date/time.  Leitha Bleak, PT 02/25/18, 1:44 PM [phone]

## 2018-02-25 NOTE — Clinical Social Work Note (Signed)
Clinical Social Work Assessment  Patient Details  Name: Emily Faulkner MRN: [id number] Date of Birth: [birth year]  Date of referral:  02/25/18               Reason for consult:  Discharge Planning                Permission sought to share information with:    Permission granted to share information::     Name::        Agency::     Relationship::     Contact Information:     Housing/Transportation Living arrangements for the past 2 months:  Single Family Home Source of Information:  Adult Children Patient Interpreter Needed:  None Criminal Activity/Legal Involvement Pertinent to Current Situation/Hospitalization:  No - Comment as needed Significant Relationships:  Adult Children Lives with:  Self Do you feel safe going back to the place where you live?    Need for family participation in patient care:  Yes (Comment)  Care giving concerns:  Patient resides at home alone.   Social Worker assessment / plan:  CSW asked to see patient due to PT recommending short term rehab. CSW was able to speak with patient's son, Emily Faulkner [phone], this morning and he was aware that a bed search had been initiated yesterday. CSW extended bed offers and he chose [facility]. [facility] is working on obtaining [place] from [place].    Employment status:    Insurance information:    PT Recommendations:  [place] / Referral to community resources:     Patient/Family's Response to care:   Emily Faulkner expressed appreciation for CSW assistance.  Patient/Family's Understanding of and Emotional Response to Diagnosis, Current Treatment, and Prognosis: Emily Faulkner stated he was prepared to pay out of pocket if patient is ready for discharge and Emily Faulkner has not yet made a determination. Emotional Assessment Appearance:  Appears stated age Attitude/Demeanor/Rapport:  (quiet) Affect (typically observed):  Calm, Constricted Orientation:  Oriented to Self Alcohol / Substance use:  Not  Applicable Psych involvement (Current and /or in the community):  No (Comment)  Discharge Needs  Concerns to be addressed:  Care Coordination Readmission within the last 30 days:  No Current discharge risk:  None Barriers to Discharge:  No Barriers Identified   Emily Leff, LCSW 02/25/2018, 1:21 PM

## 2018-02-25 NOTE — Progress Notes (Signed)
OT Cancellation Note  Patient Details Name: Emily Faulkner MRN: [id number] DOB: [birth year]   Cancelled Treatment:    Reason Eval/Treat Not Completed: Other (comment). Upon attempt to evaluate, pt with MD. Will re-attempt OT evaluation at later date/time as pt is available and medically appropriate.   Jeni Salles, MPH, MS, OTR/L ascom [phone] 02/25/18, 8:57 AM

## 2018-02-26 DIAGNOSIS — N179 Acute kidney failure, unspecified: Secondary | ICD-10-CM | POA: Diagnosis not present

## 2018-02-26 DIAGNOSIS — K2901 Acute gastritis with bleeding: Secondary | ICD-10-CM | POA: Diagnosis not present

## 2018-02-26 DIAGNOSIS — K7030 Alcoholic cirrhosis of liver without ascites: Secondary | ICD-10-CM | POA: Diagnosis not present

## 2018-02-26 DIAGNOSIS — E119 Type 2 diabetes mellitus without complications: Secondary | ICD-10-CM | POA: Diagnosis not present

## 2018-02-26 DIAGNOSIS — R531 Weakness: Secondary | ICD-10-CM | POA: Diagnosis not present

## 2018-02-26 DIAGNOSIS — H4010X0 Unspecified open-angle glaucoma, stage unspecified: Secondary | ICD-10-CM | POA: Diagnosis not present

## 2018-02-26 DIAGNOSIS — Z7982 Long term (current) use of aspirin: Secondary | ICD-10-CM | POA: Diagnosis not present

## 2018-02-26 DIAGNOSIS — M5489 Other dorsalgia: Secondary | ICD-10-CM | POA: Diagnosis not present

## 2018-02-26 DIAGNOSIS — R21 Rash and other nonspecific skin eruption: Secondary | ICD-10-CM | POA: Diagnosis not present

## 2018-02-26 DIAGNOSIS — J449 Chronic obstructive pulmonary disease, unspecified: Secondary | ICD-10-CM | POA: Diagnosis not present

## 2018-02-26 DIAGNOSIS — Z79899 Other long term (current) drug therapy: Secondary | ICD-10-CM | POA: Diagnosis not present

## 2018-02-26 DIAGNOSIS — N2889 Other specified disorders of kidney and ureter: Secondary | ICD-10-CM | POA: Diagnosis not present

## 2018-02-26 DIAGNOSIS — N390 Urinary tract infection, site not specified: Secondary | ICD-10-CM | POA: Diagnosis not present

## 2018-02-26 DIAGNOSIS — K629 Disease of anus and rectum, unspecified: Secondary | ICD-10-CM | POA: Diagnosis not present

## 2018-02-26 DIAGNOSIS — Z87891 Personal history of nicotine dependence: Secondary | ICD-10-CM | POA: Diagnosis not present

## 2018-02-26 DIAGNOSIS — M6281 Muscle weakness (generalized): Secondary | ICD-10-CM | POA: Diagnosis not present

## 2018-02-26 DIAGNOSIS — M069 Rheumatoid arthritis, unspecified: Secondary | ICD-10-CM | POA: Diagnosis not present

## 2018-02-26 DIAGNOSIS — D649 Anemia, unspecified: Secondary | ICD-10-CM | POA: Diagnosis not present

## 2018-02-26 DIAGNOSIS — E785 Hyperlipidemia, unspecified: Secondary | ICD-10-CM | POA: Diagnosis not present

## 2018-02-26 DIAGNOSIS — D500 Iron deficiency anemia secondary to blood loss (chronic): Secondary | ICD-10-CM | POA: Diagnosis not present

## 2018-02-26 DIAGNOSIS — N189 Chronic kidney disease, unspecified: Secondary | ICD-10-CM | POA: Diagnosis not present

## 2018-02-26 DIAGNOSIS — I10 Essential (primary) hypertension: Secondary | ICD-10-CM | POA: Diagnosis not present

## 2018-02-26 DIAGNOSIS — K922 Gastrointestinal hemorrhage, unspecified: Secondary | ICD-10-CM | POA: Diagnosis not present

## 2018-02-26 LAB — GLUCOSE, CAPILLARY
Glucose-Capillary: 176 mg/dL — ABNORMAL HIGH (ref 70–99)
Glucose-Capillary: 179 mg/dL — ABNORMAL HIGH (ref 70–99)

## 2018-02-26 MED ORDER — TRAMADOL HCL 50 MG PO TABS: 50.0000 mg | ORAL_TABLET | Freq: Three times a day (TID) | ORAL | 0 refills | Status: DC | PRN

## 2018-02-26 MED ORDER — PANTOPRAZOLE SODIUM 40 MG PO TBEC: 40.0000 mg | DELAYED_RELEASE_TABLET | Freq: Every day | ORAL | 0 refills | Status: DC

## 2018-02-26 MED ORDER — ENSURE ENLIVE PO LIQD: 237.0000 mL | Freq: Two times a day (BID) | ORAL | 12 refills | Status: DC

## 2018-02-26 MED ORDER — CEPHALEXIN 500 MG PO CAPS: 500.0000 mg | ORAL_CAPSULE | Freq: Two times a day (BID) | ORAL | 0 refills | Status: DC

## 2018-02-26 NOTE — Progress Notes (Signed)
RN called report to [facility]. Report given to [facility] Minor RN.

## 2018-02-26 NOTE — Clinical Social Work Note (Signed)
[facility] discharging to [facility] today. Patient's son, Kasandra Knudsen, is aware. CSW had [facility] at [facility] speak with Kasandra Knudsen and they arranged for Kasandra Knudsen to pay out of pocket while waiting on medicare human[employer] auth. Discharge information sent to [facility]. Danny to transport. Shela Leff MSW,LCSW [phone]

## 2018-02-26 NOTE — Progress Notes (Signed)
Emily Faulkner  A and O x 4. VSS. Pt tolerating diet well. No complaints of pain or nausea. IV removed intact, prescriptions given. Pt voiced understanding of discharge instructions with no further questions. Pt discharged via wheelchair with axillary. Son is transporting pt to [facility].    Allergies as of 02/26/2018   No Known Allergies     Medication List    STOP taking these medications   gabapentin 300 MG capsule Commonly known as:  NEURONTIN     TAKE these medications   ACCU-CHEK AVIVA PLUS test strip Generic drug:  glucose blood   aspirin 81 MG EC tablet Take 81 mg by mouth daily.   cephALEXin 500 MG capsule Commonly known as:  KEFLEX Take 1 capsule (500 mg total) by mouth every 12 (twelve) hours.   clindamycin 1 % external solution Commonly known as:  CLEOCIN T Apply 1 application topically 2 (two) times daily.   clobetasol 0.05 % external solution Commonly known as:  TEMOVATE Apply 1 application topically every evening. (apply to scalp)   feeding supplement (ENSURE ENLIVE) Liqd Take 237 mLs by mouth 2 (two) times daily between meals.   ferrous fumarate 325 (106 Fe) MG Tabs tablet Commonly known as:  HEMOCYTE - 106 mg FE Take 1 tablet (106 mg of iron total) by mouth 2 (two) times daily.   Fish Oil 1000 MG Caps Take 1 capsule by mouth.   furosemide 40 MG tablet Commonly known as:  LASIX Take 1 tablet (40 mg total) by mouth 2 (two) times daily.   glipiZIDE 10 MG tablet Commonly known as:  GLUCOTROL Take 10 mg by mouth daily.   hydrOXYzine 10 MG tablet Commonly known as:  ATARAX/VISTARIL Take 10 mg by mouth at bedtime.   ketoconazole 2 % shampoo Commonly known as:  NIZORAL Apply 1 application topically daily. (apply to scalp and wait at least 30 minutes before rinsing)   LANTUS SOLOSTAR 100 UNIT/ML Solostar Pen Generic drug:  Insulin Glargine Inject 36 Units into the skin daily. (may take up to 50u based upon blood glucose reading)   lisinopril  2.5 MG tablet Commonly known as:  PRINIVIL,ZESTRIL Take 2.5 mg by mouth daily.   loratadine 10 MG tablet Commonly known as:  CLARITIN Take 10 mg by mouth daily.   magnesium oxide 400 MG tablet Commonly known as:  MAG-OX Take 800 mg by mouth daily.   niacin 500 MG CR tablet Commonly known as:  NIASPAN Take 500 mg by mouth every evening.   pantoprazole 40 MG tablet Commonly known as:  PROTONIX Take 1 tablet (40 mg total) by mouth daily. What changed:  when to take this   simvastatin 20 MG tablet Commonly known as:  ZOCOR Take 20 mg by mouth at bedtime.   sitaGLIPtin 50 MG tablet Commonly known as:  JANUVIA Take 50 mg by mouth daily.   traMADol 50 MG tablet Commonly known as:  ULTRAM Take 1 tablet (50 mg total) by mouth every 8 (eight) hours as needed for severe pain.   TRAVATAN Z 0.004 % Soln ophthalmic solution Generic drug:  Travoprost (BAK Free) Place 1 drop into the left eye at bedtime.   vitamin B-12 1000 MCG tablet Commonly known as:  CYANOCOBALAMIN Take 1,000 mcg by mouth daily.   Vitamin D2 2000 units Tabs Take 1 tablet by mouth daily.       Vitals:   02/26/18 0409 02/26/18 0800  BP: 132/84 (!) 147/77  Pulse: (!) 101 93  Resp: 19   Temp: 98 F (36.7 C)   SpO2: 95%     Francesco Sor

## 2018-02-26 NOTE — Care Management Important Message (Signed)
Copy of signed IM left with patient in room.  

## 2018-02-26 NOTE — Clinical Social Work Placement (Signed)
   CLINICAL SOCIAL WORK PLACEMENT  NOTE  Date:  02/26/2018  Patient Details  Name: Emily Faulkner MRN: [id number] Date of Birth: [birth year]  Clinical Social Work is seeking post-discharge placement for this patient at the [place] level of care (*CSW will initial, date and re-position this form in  chart as items are completed):  Yes   Patient/family provided with [place] Work Department's list of facilities offering this level of care within the geographic area requested by the patient (or if unable, by the patient's family).  Yes   Patient/family informed of their freedom to choose among providers that offer the needed level of care, that participate in Medicare, Medicaid or managed care program needed by the patient, have an available bed and are willing to accept the patient.  Yes   Patient/family informed of [place]'s ownership interest in [facility] and [facility], as well as of the fact that they are under no obligation to receive care at these facilities.  PASRR submitted to EDS on 02/25/18     PASRR number received on 02/25/18     Existing PASRR number confirmed on       FL2 transmitted to all facilities in geographic area requested by pt/family on 02/24/18     FL2 transmitted to all facilities within larger geographic area on       Patient informed that his/her managed care company has contracts with or will negotiate with certain facilities, including the following:        Yes   Patient/family informed of bed offers received.  Patient chooses bed at [facility])     Physician recommends and patient chooses bed at (snf)    Patient to be transferred to ([facility]) on 02/26/18.  Patient to be transferred to facility by (son Kasandra Knudsen)     Patient family notified on 02/26/18 of transfer.  Name of family member notified:  Kasandra Knudsen)     PHYSICIAN       Additional Comment:     _______________________________________________ Shela Leff, LCSW 02/26/2018, 2:16 PM

## 2018-02-26 NOTE — Discharge Summary (Signed)
[place] [facility] [place] NAME: Emily Faulkner    MR#:  [id number]  DATE OF BIRTH:  03-Sep-1940  DATE OF ADMISSION:  02/20/2018 ADMITTING PHYSICIAN: Demetrios Loll, MD  DATE OF DISCHARGE: 02/26/2018  PRIMARY CARE PHYSICIAN: Sofie Hartigan, MD    ADMISSION DIAGNOSIS:  Gastrointestinal hemorrhage with melena [K92.1] Symptomatic anemia [D64.9]  DISCHARGE DIAGNOSIS:  Symptomatic anemia due to GI bleed from Multiple Colonic AVM's s/p argon laser rx UTI  SECONDARY DIAGNOSIS:   Past Medical History:  Diagnosis Date  . Cancer [facility])    breast cancer right mastectomy  . Diabetes mellitus without complication ([place])   . Glaucoma   . Hyperlipidemia   . Hypertension   . Neuropathy   . Osteoarthritis   . Rheumatoid arthritis ([place])   . Telangiectasias     HOSPITAL COURSE:   EmmaFarrellis W29 y.o.femalewith a known history of hypertension, hyperlipidemia, diabetes, CKD, anemia, neuropathy, osteoarthritis, breast cancer. The patient presents the ED with above chief complaints. She also complains of melena   *Symptomatic anemia due to GI bleeding from Colonic AVM's s/p cauterization -Patient has received a blood transfusion follow-up hemoglobin 6.4-7.4,7.6-7.1--8.8--9.8 -Patient had EGD and colonoscopy in February 2018 which is revealed diverticulosis.  - EGD has revealed esophagitis, gastritis and duodenitis patient sees Dr. Alice Reichert as an outpatient, just had a EGD in July 2019 non-obstructing Schatzki ring was found in the lower third of the esophagus -Seen by GI during this admission and recommending  -Colonoscopy showed -- Multiple bleeding colonic angioectasias. Treated with argon plasma coagulation (APC -Iron studies with ferritin at 17 and iron 20 -received two doses of IV iron. Will continue her oral supplements at discharge.  *ARF on CKD stage III. Hold Lasix and lisinopril, IV fluids support. Creatinine  2.06-1.92--1.73  *Hypertension. Controlled -resumed Lasix and lisinopril  Creat better  *Hyperlipidemia continue statin  *febrile illness with dysuria -po kelfex for 5 days -BC negative  *History of Breast cancer outpatient follow-up with oncology as recommended  *Upper extremity jerking movement more right the left -has had history of jerking movement however it has gotten worse in the last few days which could be precipitated by anemia. sHe is also on high dose of gabapentin which I'm going to discontinue. -much improved  * SOB with h/o COPD and chronic interstitial lung dz -IV lasix 20 mg x1 Will resume home dose of lasix given h/o cirrhosis and Ascites  Patient is very weak deconditioned and physical therapy has seen patient. She will benefit from rehab. I spoke with patient's son Kasandra Knudsen over the phone and patient and he is in agreement with it. She lives alone and given her weakness and significant upper extremity involuntary jerking movements I don't feel comfortable her going home at present.  pt is medically best at baseline for discharge. Awaiting insurance authorization.  CONSULTS OBTAINED:  Treatment Team:  Fritzi Mandes, MD  DRUG ALLERGIES:  No Known Allergies  DISCHARGE MEDICATIONS:   Allergies as of 02/26/2018   No Known Allergies     Medication List    STOP taking these medications   gabapentin 300 MG capsule Commonly known as:  NEURONTIN     TAKE these medications   ACCU-CHEK AVIVA PLUS test strip Generic drug:  glucose blood   aspirin 81 MG EC tablet Take 81 mg by mouth daily.   cephALEXin 500 MG capsule Commonly known as:  KEFLEX Take 1 capsule (500 mg total) by mouth every 12 (twelve) hours.  clindamycin 1 % external solution Commonly known as:  CLEOCIN T Apply 1 application topically 2 (two) times daily.   clobetasol 0.05 % external solution Commonly known as:  TEMOVATE Apply 1 application topically every evening. (apply to  scalp)   feeding supplement (ENSURE ENLIVE) Liqd Take 237 mLs by mouth 2 (two) times daily between meals.   ferrous fumarate 325 (106 Fe) MG Tabs tablet Commonly known as:  HEMOCYTE - 106 mg FE Take 1 tablet (106 mg of iron total) by mouth 2 (two) times daily.   Fish Oil 1000 MG Caps Take 1 capsule by mouth.   furosemide 40 MG tablet Commonly known as:  LASIX Take 1 tablet (40 mg total) by mouth 2 (two) times daily.   glipiZIDE 10 MG tablet Commonly known as:  GLUCOTROL Take 10 mg by mouth daily.   hydrOXYzine 10 MG tablet Commonly known as:  ATARAX/VISTARIL Take 10 mg by mouth at bedtime.   ketoconazole 2 % shampoo Commonly known as:  NIZORAL Apply 1 application topically daily. (apply to scalp and wait at least 30 minutes before rinsing)   LANTUS SOLOSTAR 100 UNIT/ML Solostar Pen Generic drug:  Insulin Glargine Inject 36 Units into the skin daily. (may take up to 50u based upon blood glucose reading)   lisinopril 2.5 MG tablet Commonly known as:  PRINIVIL,ZESTRIL Take 2.5 mg by mouth daily.   loratadine 10 MG tablet Commonly known as:  CLARITIN Take 10 mg by mouth daily.   magnesium oxide 400 MG tablet Commonly known as:  MAG-OX Take 800 mg by mouth daily.   niacin 500 MG CR tablet Commonly known as:  NIASPAN Take 500 mg by mouth every evening.   pantoprazole 40 MG tablet Commonly known as:  PROTONIX Take 1 tablet (40 mg total) by mouth daily. What changed:  when to take this   simvastatin 20 MG tablet Commonly known as:  ZOCOR Take 20 mg by mouth at bedtime.   sitaGLIPtin 50 MG tablet Commonly known as:  JANUVIA Take 50 mg by mouth daily.   traMADol 50 MG tablet Commonly known as:  ULTRAM Take 1 tablet (50 mg total) by mouth every 8 (eight) hours as needed for severe pain.   TRAVATAN Z 0.004 % Soln ophthalmic solution Generic drug:  Travoprost (BAK Free) Place 1 drop into the left eye at bedtime.   vitamin B-12 1000 MCG tablet Commonly known  as:  CYANOCOBALAMIN Take 1,000 mcg by mouth daily.   Vitamin D2 2000 units Tabs Take 1 tablet by mouth daily.       If you experience worsening of your admission symptoms, develop shortness of breath, life threatening emergency, suicidal or homicidal thoughts you must seek medical attention immediately by calling 911 or calling your MD immediately  if symptoms less severe.  You Must read complete instructions/literature along with all the possible adverse reactions/side effects for all the Medicines you take and that have been prescribed to you. Take any new Medicines after you have completely understood and accept all the possible adverse reactions/side effects.   Please note  You were cared for by a hospitalist during your hospital stay. If you have any questions about your discharge medications or the care you received while you were in the hospital after you are discharged, you can call the unit and asked to speak with the hospitalist on call if the hospitalist that took care of you is not available. Once you are discharged, your primary care physician will handle any  further medical issues. Please note that NO REFILLS for any discharge medications will be authorized once you are discharged, as it is imperative that you return to your primary care physician (or establish a relationship with a primary care physician if you do not have one) for your aftercare needs so that they can reassess your need for medications and monitor your lab values. Today   SUBJECTIVE   Sitting at the edge of the bed. Awake and alert feeling weak  VITAL SIGNS:  Blood pressure 132/84, pulse (!) 101, temperature 98 F (36.7 C), temperature source Oral, resp. rate 19, height 5' 4"  (1.626 m), weight 84.4 kg, SpO2 95 %.  I/O:    Intake/Output Summary (Last 24 hours) at 02/26/2018 0807 Last data filed at 02/26/2018 0414 Gross per 24 hour  Intake -  Output 200 ml  Net -200 ml    PHYSICAL EXAMINATION:   GENERAL:  77 y.o.-year-old patient lying in the bed with no acute distress.  EYES: Pupils equal, round, reactive to light and accommodation. No scleral icterus. Extraocular muscles intact.  HEENT: Head atraumatic, normocephalic. Oropharynx and nasopharynx clear.  NECK:  Supple, no jugular venous distention. No thyroid enlargement, no tenderness.  LUNGS: Normal breath sounds bilaterally, no wheezing, rales,rhonchi or crepitation. No use of accessory muscles of respiration.  CARDIOVASCULAR: S1, S2 normal. No murmurs, rubs, or gallops.  ABDOMEN: Soft, non-tender, non-distended. Bowel sounds present. No organomegaly or mass.  EXTREMITIES: No pedal edema, cyanosis, or clubbing.  NEUROLOGIC: Cranial nerves II through XII are intact. Muscle strength 5/5 in all extremities. Sensation intact. Gait not checked.  PSYCHIATRIC: The patient is alert and oriented x 3.  SKIN: No obvious rash, lesion, or ulcer.   DATA REVIEW:   CBC  Recent Labs  Lab 02/23/18 1851  02/25/18 0905  WBC 6.8  --   --   HGB 6.6*   < > 9.8*  HCT 20.4*   < > 30.4*  PLT 212  --   --    < > = values in this interval not displayed.    Chemistries  Recent Labs  Lab 02/20/18 1429  02/22/18 0437  NA 133*   < > 142  K 4.4   < > 4.4  CL 98   < > 110  CO2 25   < > 26  GLUCOSE 240*   < > 144*  BUN 39*   < > 28*  CREATININE 2.06*   < > 1.73*  CALCIUM 8.8*   < > 8.8*  AST 34  --   --   ALT 19  --   --   ALKPHOS 60  --   --   BILITOT 0.8  --   --    < > = values in this interval not displayed.    Microbiology Results   Recent Results (from the past 240 hour(s))  CULTURE, BLOOD (ROUTINE X 2) w Reflex to ID Panel     Status: None (Preliminary result)   Collection Time: 02/23/18  7:05 PM  Result Value Ref Range Status   Specimen Description BLOOD LEFT WRIST  Final   Special Requests   Final    BOTTLES DRAWN AEROBIC AND ANAEROBIC Blood Culture results may not be optimal due to an inadequate volume of blood received in  culture bottles   Culture   Final    NO GROWTH 3 DAYS Performed at [facility], [address], [place], [place] [address]    Report Status PENDING  Incomplete  CULTURE, BLOOD (ROUTINE X 2) w Reflex to ID Panel     Status: None (Preliminary result)   Collection Time: 02/23/18  7:05 PM  Result Value Ref Range Status   Specimen Description BLOOD LEFT HAND  Final   Special Requests   Final    BOTTLES DRAWN AEROBIC AND ANAEROBIC Blood Culture adequate volume   Culture   Final    NO GROWTH 3 DAYS Performed at [facility], [address], [place], [place] [address]    Report Status PENDING  Incomplete    RADIOLOGY:  No results found.   Management plans discussed with the patient, family and they are in agreement.  CODE STATUS:     Code Status Orders  (From admission, onward)         Start     Ordered   02/20/18 1714  Full code  Continuous     02/20/18 1713        Code Status History    Date Active Date Inactive Code Status Order ID Comments User Context   01/29/2018 1648 01/31/2018 1535 Full Code [id number]  Hillary Bow, MD ED      TOTAL TIME TAKING CARE OF THIS PATIENT: *40* minutes.    Fritzi Mandes M.D on 02/26/2018 at 8:07 AM  Between 7am to 6pm - Pager - [phone] After 6pm go to [email or web address] - password [email or web address] [facility]  Office  [phone]  CC: Primary care physician; Sofie Hartigan, MD

## 2018-02-27 NOTE — Anesthesia Postprocedure Evaluation (Signed)
Anesthesia Post Note  Patient: Emily Faulkner  Procedure(s) Performed: ESOPHAGOGASTRODUODENOSCOPY (EGD) WITH PROPOFOL (N/A ) COLONOSCOPY WITH PROPOFOL (N/A )  Patient location during evaluation: PACU Anesthesia Type: General Level of consciousness: awake and alert and oriented Pain management: pain level controlled Vital Signs Assessment: post-procedure vital signs reviewed and stable Respiratory status: spontaneous breathing Cardiovascular status: blood pressure returned to baseline Anesthetic complications: no     Last Vitals:  Vitals:   02/26/18 0409 02/26/18 0800  BP: 132/84 (!) 147/77  Pulse: (!) 101 93  Resp: 19   Temp: 36.7 C   SpO2: 95%     Last Pain:  Vitals:   02/26/18 0735  TempSrc:   PainSc: 0-No pain                 Elica Almas

## 2018-02-28 DIAGNOSIS — I10 Essential (primary) hypertension: Secondary | ICD-10-CM | POA: Diagnosis not present

## 2018-02-28 DIAGNOSIS — D649 Anemia, unspecified: Secondary | ICD-10-CM | POA: Diagnosis not present

## 2018-02-28 DIAGNOSIS — N179 Acute kidney failure, unspecified: Secondary | ICD-10-CM | POA: Diagnosis not present

## 2018-02-28 DIAGNOSIS — K629 Disease of anus and rectum, unspecified: Secondary | ICD-10-CM | POA: Diagnosis not present

## 2018-02-28 DIAGNOSIS — K7030 Alcoholic cirrhosis of liver without ascites: Secondary | ICD-10-CM | POA: Diagnosis not present

## 2018-02-28 DIAGNOSIS — N390 Urinary tract infection, site not specified: Secondary | ICD-10-CM | POA: Diagnosis not present

## 2018-02-28 DIAGNOSIS — J449 Chronic obstructive pulmonary disease, unspecified: Secondary | ICD-10-CM | POA: Diagnosis not present

## 2018-02-28 LAB — CULTURE, BLOOD (ROUTINE X 2)
CULTURE: NO GROWTH
Culture: NO GROWTH
Special Requests: ADEQUATE

## 2018-03-04 DIAGNOSIS — N189 Chronic kidney disease, unspecified: Secondary | ICD-10-CM | POA: Diagnosis not present

## 2018-03-04 DIAGNOSIS — E119 Type 2 diabetes mellitus without complications: Secondary | ICD-10-CM | POA: Diagnosis not present

## 2018-03-04 DIAGNOSIS — E785 Hyperlipidemia, unspecified: Secondary | ICD-10-CM | POA: Diagnosis not present

## 2018-03-04 DIAGNOSIS — D649 Anemia, unspecified: Secondary | ICD-10-CM | POA: Diagnosis not present

## 2018-03-04 DIAGNOSIS — K2901 Acute gastritis with bleeding: Secondary | ICD-10-CM | POA: Diagnosis not present

## 2018-03-04 DIAGNOSIS — M5489 Other dorsalgia: Secondary | ICD-10-CM | POA: Diagnosis not present

## 2018-03-04 DIAGNOSIS — I10 Essential (primary) hypertension: Secondary | ICD-10-CM | POA: Diagnosis not present

## 2018-03-07 ENCOUNTER — Inpatient Hospital Stay: Payer: Medicare HMO | Attending: Oncology | Admitting: Oncology

## 2018-03-07 ENCOUNTER — Encounter: Payer: Self-pay | Admitting: Oncology

## 2018-03-07 ENCOUNTER — Inpatient Hospital Stay: Payer: Medicare HMO

## 2018-03-07 VITALS — BP 93/61 | HR 110 | Temp 98.0°F | Resp 18 | Wt 172.4 lb

## 2018-03-07 DIAGNOSIS — Z7982 Long term (current) use of aspirin: Secondary | ICD-10-CM | POA: Insufficient documentation

## 2018-03-07 DIAGNOSIS — D509 Iron deficiency anemia, unspecified: Secondary | ICD-10-CM

## 2018-03-07 DIAGNOSIS — Z79899 Other long term (current) drug therapy: Secondary | ICD-10-CM | POA: Diagnosis not present

## 2018-03-07 DIAGNOSIS — Z87891 Personal history of nicotine dependence: Secondary | ICD-10-CM | POA: Diagnosis not present

## 2018-03-07 DIAGNOSIS — M069 Rheumatoid arthritis, unspecified: Secondary | ICD-10-CM | POA: Diagnosis not present

## 2018-03-07 DIAGNOSIS — I10 Essential (primary) hypertension: Secondary | ICD-10-CM | POA: Insufficient documentation

## 2018-03-07 DIAGNOSIS — K922 Gastrointestinal hemorrhage, unspecified: Secondary | ICD-10-CM | POA: Diagnosis not present

## 2018-03-07 DIAGNOSIS — N2889 Other specified disorders of kidney and ureter: Secondary | ICD-10-CM

## 2018-03-07 DIAGNOSIS — E119 Type 2 diabetes mellitus without complications: Secondary | ICD-10-CM | POA: Diagnosis not present

## 2018-03-07 DIAGNOSIS — D500 Iron deficiency anemia secondary to blood loss (chronic): Secondary | ICD-10-CM | POA: Insufficient documentation

## 2018-03-07 LAB — CBC WITH DIFFERENTIAL/PLATELET
BASOS PCT: 2 %
Basophils Absolute: 0.1 10*3/uL (ref 0–0.1)
Eosinophils Absolute: 0.2 10*3/uL (ref 0–0.7)
Eosinophils Relative: 3 %
HCT: 37.3 % (ref 35.0–47.0)
HEMOGLOBIN: 12.1 g/dL (ref 12.0–16.0)
LYMPHS PCT: 11 %
Lymphs Abs: 0.9 10*3/uL — ABNORMAL LOW (ref 1.0–3.6)
MCH: 32.4 pg (ref 26.0–34.0)
MCHC: 32.3 g/dL (ref 32.0–36.0)
MCV: 100.2 fL — ABNORMAL HIGH (ref 80.0–100.0)
Monocytes Absolute: 0.8 10*3/uL (ref 0.2–0.9)
Monocytes Relative: 10 %
NEUTROS ABS: 5.9 10*3/uL (ref 1.4–6.5)
NEUTROS PCT: 74 %
PLATELETS: 276 10*3/uL (ref 150–440)
RBC: 3.73 MIL/uL — AB (ref 3.80–5.20)
RDW: 17.5 % — ABNORMAL HIGH (ref 11.5–14.5)
WBC: 7.9 10*3/uL (ref 3.6–11.0)

## 2018-03-07 LAB — IRON AND TIBC
Iron: 78 ug/dL (ref 28–170)
Saturation Ratios: 24 % (ref 10.4–31.8)
TIBC: 326 ug/dL (ref 250–450)
UIBC: 248 ug/dL

## 2018-03-07 LAB — FERRITIN: FERRITIN: 265 ng/mL (ref 11–307)

## 2018-03-07 NOTE — Progress Notes (Signed)
[place]  [phone] [phone]  ID: Emily Faulkner OB: May 18, 1941  MR#: [id number]  YHC#:[id number]  Patient Care Team: Sofie Hartigan, MD as PCP - General (Family Medicine)  CHIEF COMPLAINT: Iron deficiency anemia.  INTERVAL HISTORY: Patient was last evaluated in November 2018.  She returns to clinic today after being admitted to the hospital with GI bleed.  Several ulcerations were found in her colon that were treated with argon laser.  She currently feels well and is nearly back to her baseline.  She does not complain of weakness or fatigue today.  She has no neurologic complaints.  She denies any recent fevers.  She has good appetite and denies weight loss.  She has no chest pain or shortness of breath.  She denies any nausea, vomiting, constipation, or diarrhea.  She has no further melena or hematochezia.  She has no urinary complaints.  Patient offers no specific complaints today.    REVIEW OF SYSTEMS:   Review of Systems  Constitutional: Negative for fever, malaise/fatigue and weight loss.  HENT: Negative.  Negative for congestion.   Respiratory: Negative.  Negative for cough, hemoptysis and shortness of breath.   Cardiovascular: Negative.  Negative for chest pain and leg swelling.  Gastrointestinal: Negative for abdominal pain, blood in stool, constipation, diarrhea, nausea and vomiting.  Genitourinary: Negative.  Negative for dysuria, frequency and urgency.  Musculoskeletal: Negative.   Skin: Negative.  Negative for rash.  Neurological: Negative.  Negative for tingling, tremors, weakness and headaches.  Psychiatric/Behavioral: Negative.  Negative for depression. The patient is not nervous/anxious.    As per HPI. Otherwise, a complete review of systems is negative.  PAST MEDICAL HISTORY: Past Medical History:  Diagnosis Date  . Cancer [facility])    breast cancer right mastectomy  . Diabetes mellitus without complication ([place])   .  Glaucoma   . Hyperlipidemia   . Hypertension   . Neuropathy   . Osteoarthritis   . Rheumatoid arthritis ([place])   . Telangiectasias     PAST SURGICAL HISTORY: Past Surgical History:  Procedure Laterality Date  . BREAST SURGERY     right mastectomy  . CHOLECYSTECTOMY    . COLONOSCOPY    . COLONOSCOPY WITH PROPOFOL N/A 08/14/2016   Procedure: COLONOSCOPY WITH PROPOFOL;  Surgeon: Lollie Sails, MD;  Location[facility] ENDOSCOPY;  Service: Endoscopy;  Laterality: N/A;  . COLONOSCOPY WITH PROPOFOL N/A 02/22/2018   Procedure: COLONOSCOPY WITH PROPOFOL;  Surgeon: Jonathon Bellows, MD;  Location: [facility] ENDOSCOPY;  Service: Gastroenterology;  Laterality: N/A;  . ESOPHAGOGASTRODUODENOSCOPY (EGD) WITH PROPOFOL N/A 08/14/2016   Procedure: ESOPHAGOGASTRODUODENOSCOPY (EGD) WITH PROPOFOL;  Surgeon: Lollie Sails, MD;  Location: [facility] ENDOSCOPY;  Service: Endoscopy;  Laterality: N/A;  . ESOPHAGOGASTRODUODENOSCOPY (EGD) WITH PROPOFOL N/A 01/30/2018   Procedure: ESOPHAGOGASTRODUODENOSCOPY (EGD) WITH PROPOFOL;  Surgeon: Toledo, Benay Pike, MD;  Location: [facility] ENDOSCOPY;  Service: Gastroenterology;  Laterality: N/A;  . ESOPHAGOGASTRODUODENOSCOPY (EGD) WITH PROPOFOL N/A 02/22/2018   Procedure: ESOPHAGOGASTRODUODENOSCOPY (EGD) WITH PROPOFOL;  Surgeon: Jonathon Bellows, MD;  Location: [facility] ENDOSCOPY;  Service: Gastroenterology;  Laterality: N/A;  . EXCISIONAL HEMORRHOIDECTOMY      FAMILY HISTORY: Reviewed and unchanged. No reported history of malignancy or chronic disease.  ADVANCED DIRECTIVES (Y/N):  N  HEALTH MAINTENANCE: Social History   Tobacco Use  . Smoking status: Former Smoker    Last attempt to quit: 07/10/2011    Years since quitting: 6.6  . Smokeless tobacco: Never Used  Substance Use Topics  . Alcohol  use: No  . Drug use: No     Colonoscopy:  PAP:  Bone density:  Lipid panel:  No Known Allergies  Current Outpatient Medications  Medication Sig Dispense Refill  . ACCU-CHEK AVIVA PLUS test strip      . aspirin 81 MG EC tablet Take 81 mg by mouth daily.     . cephALEXin (KEFLEX) 500 MG capsule Take 1 capsule (500 mg total) by mouth every 12 (twelve) hours. 4 capsule 0  . clindamycin (CLEOCIN T) 1 % external solution Apply 1 application topically 2 (two) times daily.  2  . clobetasol (TEMOVATE) 0.05 % external solution Apply 1 application topically every evening. (apply to scalp)    . Ergocalciferol (VITAMIN D2) 2000 units TABS Take 1 tablet by mouth daily.    . feeding supplement, ENSURE ENLIVE, (ENSURE ENLIVE) LIQD Take 237 mLs by mouth 2 (two) times daily between meals. 237 mL 12  . ferrous fumarate (HEMOCYTE - 106 MG FE) 325 (106 Fe) MG TABS tablet Take 1 tablet (106 mg of iron total) by mouth 2 (two) times daily. 60 each 0  . furosemide (LASIX) 40 MG tablet Take 1 tablet (40 mg total) by mouth 2 (two) times daily. 60 tablet 0  . glipiZIDE (GLUCOTROL) 10 MG tablet Take 10 mg by mouth daily.     . hydrOXYzine (ATARAX/VISTARIL) 10 MG tablet Take 10 mg by mouth at bedtime.  1  . Insulin Glargine (LANTUS SOLOSTAR) 100 UNIT/ML Solostar Pen Inject 36 Units into the skin daily. (may take up to 50u based upon blood glucose reading)    . ketoconazole (NIZORAL) 2 % shampoo Apply 1 application topically daily. (apply to scalp and wait at least 30 minutes before rinsing)    . lisinopril (PRINIVIL,ZESTRIL) 2.5 MG tablet Take 2.5 mg by mouth daily.     Marland Kitchen loratadine (CLARITIN) 10 MG tablet Take 10 mg by mouth daily.    . magnesium oxide (MAG-OX) 400 MG tablet Take 800 mg by mouth daily.    . niacin (NIASPAN) 500 MG CR tablet Take 500 mg by mouth every evening.  2  . Omega-3 Fatty Acids (FISH OIL) 1000 MG CAPS Take 1 capsule by mouth.     . pantoprazole (PROTONIX) 40 MG tablet Take 1 tablet (40 mg total) by mouth daily. 30 tablet 0  . simvastatin (ZOCOR) 20 MG tablet Take 20 mg by mouth at bedtime.  6  . sitaGLIPtin (JANUVIA) 50 MG tablet Take 50 mg by mouth daily.    . traMADol (ULTRAM) 50 MG tablet  Take 1 tablet (50 mg total) by mouth every 8 (eight) hours as needed for severe pain. 15 tablet 0  . TRAVATAN Z 0.004 % SOLN ophthalmic solution Place 1 drop into the left eye at bedtime.    . vitamin B-12 (CYANOCOBALAMIN) 1000 MCG tablet Take 1,000 mcg by mouth daily.     No current facility-administered medications for this visit.     OBJECTIVE: Vitals:   03/07/18 1019  BP: 93/61  Pulse: (!) 110  Resp: 18  Temp: 98 F (36.7 C)     Body mass index is 29.59 kg/m.    ECOG FS:1 - Symptomatic but completely ambulatory  General: Well-developed, well-nourished, no acute distress. Eyes: Pink conjunctiva, anicteric sclera. HEENT: Normocephalic, moist mucous membranes, clear oropharnyx. Lungs: Clear to auscultation bilaterally. Heart: Regular rate and rhythm. No rubs, murmurs, or gallops. Abdomen: Soft, nontender, nondistended. No organomegaly noted, normoactive bowel sounds. Musculoskeletal: No edema, cyanosis, or clubbing.  Neuro: Alert, answering all questions appropriately. Cranial nerves grossly intact. Skin: No rashes or petechiae noted. Psych: Normal affect. Lymphatics: No cervical, calvicular, axillary or inguinal LAD.   LAB RESULTS:  Lab Results  Component Value Date   NA 142 02/22/2018   K 4.4 02/22/2018   CL 110 02/22/2018   CO2 26 02/22/2018   GLUCOSE 144 (H) 02/22/2018   BUN 28 (H) 02/22/2018   CREATININE 1.73 (H) 02/22/2018   CALCIUM 8.8 (L) 02/22/2018   PROT 6.7 02/20/2018   ALBUMIN 3.4 (L) 02/20/2018   AST 34 02/20/2018   ALT 19 02/20/2018   ALKPHOS 60 02/20/2018   BILITOT 0.8 02/20/2018   GFRNONAA 27 (L) 02/22/2018   GFRAA 32 (L) 02/22/2018    Lab Results  Component Value Date   WBC 7.9 03/07/2018   NEUTROABS 5.9 03/07/2018   HGB 12.1 03/07/2018   HCT 37.3 03/07/2018   MCV 100.2 (H) 03/07/2018   PLT 276 03/07/2018   Lab Results  Component Value Date   IRON 20 (L) 02/21/2018   TIBC 378 02/21/2018   IRONPCTSAT 5 (L) 02/21/2018   Lab Results    Component Value Date   FERRITIN 17 02/21/2018    STUDIES: Ct Head Wo Contrast  Result Date: 02/23/2018 CLINICAL DATA:  Altered level of consciousness. EXAM: CT HEAD WITHOUT CONTRAST TECHNIQUE: Contiguous axial images were obtained from the base of the skull through the vertex without intravenous contrast. COMPARISON:  CT head 02/28/2006 FINDINGS: Brain: Generalized atrophy with progression. Chronic ischemic changes have progressed. Chronic microvascular ischemia in the white matter bilaterally. 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 Negative for acute infarct, hemorrhage, or mass lesion. No midline shift. Vascular: Negative for hyperdense vessel Skull: Chronic fracture left parietal bone which was noted previously. No acute skeletal abnormality Sinuses/Orbits: Paranasal sinuses clear.  Bilateral cataract surgery Other: None IMPRESSION: Progression of atrophy and chronic ischemia since 2007. No acute abnormality. Electronically Signed   By: Franchot Gallo M.D.   On: 02/23/2018 14:08   Dg Chest Port 1 View  Result Date: 02/24/2018 CLINICAL DATA:  Shortness of breath, cough, melena, anemia. EXAM: PORTABLE CHEST 1 VIEW COMPARISON:  In 10/2017 FINDINGS: Stable top-normal heart size. Stable chronic appearance interstitial lung disease and potential component of chronic interstitial edema and pulmonary venous hypertension. There may be trace bilateral pleural effusions. No focal airspace consolidation, nodule or pneumothorax identified. The bony thorax is unremarkable. IMPRESSION: Stable chronic interstitial lung disease and potential component of chronic interstitial edema. Possible small bilateral pleural effusions. Electronically Signed   By: Aletta Edouard M.D.   On: 02/24/2018 08:43    ASSESSMENT: Iron deficiency anemia.  PLAN:    1. Iron deficiency anemia: Secondary to recent GI bleed.  Patient had upper endoscopy on January 30, 2018.  She had a small bowel endoscopy and  colonoscopy on February 22, 2018 that revealed multiple ulcerations that were subsequently treated with argon laser.  She denies any further melena or hematochezia.  Her hemoglobin has significantly improved to 12.1 and is now within normal limits.  Iron stores are pending at time of dictation.  No intervention needed at this time.  Return to clinic in 1 month with repeat laboratory work and further evaluation.   2. Left kidney mass: Continue follow-up with urology as indicated.  I spent a total of 30 minutes face-to-face with the patient of which greater than 50% of the visit was spent in counseling and coordination of care as detailed above.  Patient expressed understanding and was in agreement with this plan. She also understands that She can call clinic at any time with any questions, concerns, or complaints.    Lloyd Huger, MD 03/07/18 12:12 PM

## 2018-03-07 NOTE — Progress Notes (Signed)
Patient here today for hospital follow up, recently hospitalized for GI bleed.

## 2018-03-12 ENCOUNTER — Other Ambulatory Visit: Payer: Self-pay

## 2018-03-12 DIAGNOSIS — M5126 Other intervertebral disc displacement, lumbar region: Secondary | ICD-10-CM | POA: Diagnosis not present

## 2018-03-12 DIAGNOSIS — Z7982 Long term (current) use of aspirin: Secondary | ICD-10-CM | POA: Diagnosis not present

## 2018-03-12 DIAGNOSIS — I10 Essential (primary) hypertension: Secondary | ICD-10-CM | POA: Diagnosis not present

## 2018-03-12 DIAGNOSIS — M545 Low back pain: Secondary | ICD-10-CM | POA: Diagnosis not present

## 2018-03-12 DIAGNOSIS — E785 Hyperlipidemia, unspecified: Secondary | ICD-10-CM | POA: Diagnosis not present

## 2018-03-12 DIAGNOSIS — M25561 Pain in right knee: Secondary | ICD-10-CM | POA: Diagnosis not present

## 2018-03-12 DIAGNOSIS — R29890 Loss of height: Secondary | ICD-10-CM | POA: Diagnosis not present

## 2018-03-12 DIAGNOSIS — M5136 Other intervertebral disc degeneration, lumbar region: Secondary | ICD-10-CM | POA: Diagnosis not present

## 2018-03-12 DIAGNOSIS — E7800 Pure hypercholesterolemia, unspecified: Secondary | ICD-10-CM | POA: Diagnosis not present

## 2018-03-12 DIAGNOSIS — E119 Type 2 diabetes mellitus without complications: Secondary | ICD-10-CM | POA: Diagnosis not present

## 2018-03-12 DIAGNOSIS — M25562 Pain in left knee: Secondary | ICD-10-CM | POA: Diagnosis not present

## 2018-03-12 NOTE — Patient Outreach (Signed)
[facility]) Care Management  03/12/2018  MANDY PEEKS [birth year] [id number]   Referral Date: 03/12/18 Referral Source: [employer] Report Date of Admission: unknown Diagnosis:  unknown Date of Discharge: 03/09/18 Facility:  [place]:  [employer]  Outreach attempt: no answer.  HIPAA compliant voice message left.  Plan: RN CM will send letter and attempt again within 4 business days.     Jone Baseman, RN, MSN [facility] Care Management Care Management Coordinator Direct Line [phone] Toll Free: [phone]  Fax: [phone]

## 2018-03-13 ENCOUNTER — Telehealth: Payer: Self-pay | Admitting: Urology

## 2018-03-13 DIAGNOSIS — N2889 Other specified disorders of kidney and ureter: Secondary | ICD-10-CM

## 2018-03-13 NOTE — Telephone Encounter (Signed)
Ok I talked to Emily Faulkner and Emily Faulkner said that is fine Emily Faulkner will just need an order for the labs since it's in [place]. And if we need to change the order on Monday you will be in the OR so I will just have one of the other MD's put the order in if needed if that's ok so that they are not having to wait.   Sharyn Lull

## 2018-03-13 NOTE — Telephone Encounter (Signed)
Please have then recheck her labs on Monday as her last labs were during an acute illness.  They can use reduced dose as needed.    If her GFR is less than 30 on that day, then will change order.  Hollice Espy, MD

## 2018-03-13 NOTE — Telephone Encounter (Signed)
Received a call from [facility] in [place] in [place] and she said that this patient's GFR was 27 and her creatine was 1.73 therefore they are unable to give the patient contrast. We need to change her CT order to [facility]. Her scan is scheduled for Monday the 9th. Can you put in a new order please.   Thanks, Sharyn Lull

## 2018-03-13 NOTE — Telephone Encounter (Signed)
Order placed.  They are supposed to take a verbal order and is very incidence in order to not delay patient care.  If they are not willing to do this, please have them page me directly.    Hollice Espy, MD

## 2018-03-14 ENCOUNTER — Ambulatory Visit: Payer: Medicare HMO | Admitting: Urology

## 2018-03-17 ENCOUNTER — Telehealth: Payer: Self-pay | Admitting: Urology

## 2018-03-17 ENCOUNTER — Other Ambulatory Visit
Admission: RE | Admit: 2018-03-17 | Discharge: 2018-03-17 | Disposition: A | Discharge status: Home / Self Care | Payer: Medicare HMO | Source: Ambulatory Visit | Attending: Urology | Admitting: Urology

## 2018-03-17 ENCOUNTER — Encounter: Payer: Self-pay | Admitting: Emergency Medicine

## 2018-03-17 ENCOUNTER — Emergency Department: Payer: Medicare HMO

## 2018-03-17 ENCOUNTER — Other Ambulatory Visit: Payer: Self-pay | Admitting: Urology

## 2018-03-17 ENCOUNTER — Other Ambulatory Visit: Payer: Self-pay

## 2018-03-17 ENCOUNTER — Inpatient Hospital Stay
Admission: EM | Admit: 2018-03-17 | Discharge: 2018-03-21 | DRG: 982 | Disposition: A | Discharge status: Home Health | Payer: Medicare HMO | Attending: Internal Medicine | Admitting: Internal Medicine

## 2018-03-17 ENCOUNTER — Ambulatory Visit
Admission: RE | Admit: 2018-03-17 | Discharge: 2018-03-17 | Disposition: A | Discharge status: Home / Self Care | Payer: Medicare HMO | Source: Ambulatory Visit | Attending: Urology | Admitting: Urology

## 2018-03-17 DIAGNOSIS — R630 Anorexia: Secondary | ICD-10-CM | POA: Diagnosis present

## 2018-03-17 DIAGNOSIS — E1142 Type 2 diabetes mellitus with diabetic polyneuropathy: Secondary | ICD-10-CM | POA: Diagnosis present

## 2018-03-17 DIAGNOSIS — E860 Dehydration: Secondary | ICD-10-CM | POA: Diagnosis present

## 2018-03-17 DIAGNOSIS — Z87891 Personal history of nicotine dependence: Secondary | ICD-10-CM | POA: Diagnosis not present

## 2018-03-17 DIAGNOSIS — D631 Anemia in chronic kidney disease: Secondary | ICD-10-CM | POA: Diagnosis not present

## 2018-03-17 DIAGNOSIS — M069 Rheumatoid arthritis, unspecified: Secondary | ICD-10-CM | POA: Diagnosis present

## 2018-03-17 DIAGNOSIS — N179 Acute kidney failure, unspecified: Principal | ICD-10-CM

## 2018-03-17 DIAGNOSIS — Z9011 Acquired absence of right breast and nipple: Secondary | ICD-10-CM

## 2018-03-17 DIAGNOSIS — R161 Splenomegaly, not elsewhere classified: Secondary | ICD-10-CM

## 2018-03-17 DIAGNOSIS — N289 Disorder of kidney and ureter, unspecified: Secondary | ICD-10-CM | POA: Diagnosis not present

## 2018-03-17 DIAGNOSIS — E785 Hyperlipidemia, unspecified: Secondary | ICD-10-CM | POA: Diagnosis present

## 2018-03-17 DIAGNOSIS — Z853 Personal history of malignant neoplasm of breast: Secondary | ICD-10-CM | POA: Diagnosis not present

## 2018-03-17 DIAGNOSIS — N2889 Other specified disorders of kidney and ureter: Secondary | ICD-10-CM

## 2018-03-17 DIAGNOSIS — Z7982 Long term (current) use of aspirin: Secondary | ICD-10-CM | POA: Diagnosis not present

## 2018-03-17 DIAGNOSIS — M549 Dorsalgia, unspecified: Secondary | ICD-10-CM | POA: Diagnosis present

## 2018-03-17 DIAGNOSIS — M1990 Unspecified osteoarthritis, unspecified site: Secondary | ICD-10-CM | POA: Diagnosis present

## 2018-03-17 DIAGNOSIS — E119 Type 2 diabetes mellitus without complications: Secondary | ICD-10-CM | POA: Diagnosis not present

## 2018-03-17 DIAGNOSIS — M4854XA Collapsed vertebra, not elsewhere classified, thoracic region, initial encounter for fracture: Secondary | ICD-10-CM | POA: Diagnosis not present

## 2018-03-17 DIAGNOSIS — I10 Essential (primary) hypertension: Secondary | ICD-10-CM | POA: Diagnosis not present

## 2018-03-17 DIAGNOSIS — I129 Hypertensive chronic kidney disease with stage 1 through stage 4 chronic kidney disease, or unspecified chronic kidney disease: Secondary | ICD-10-CM | POA: Diagnosis not present

## 2018-03-17 DIAGNOSIS — Z66 Do not resuscitate: Secondary | ICD-10-CM | POA: Diagnosis present

## 2018-03-17 DIAGNOSIS — Z79899 Other long term (current) drug therapy: Secondary | ICD-10-CM | POA: Diagnosis not present

## 2018-03-17 DIAGNOSIS — E1122 Type 2 diabetes mellitus with diabetic chronic kidney disease: Secondary | ICD-10-CM | POA: Diagnosis not present

## 2018-03-17 DIAGNOSIS — S22080A Wedge compression fracture of T11-T12 vertebra, initial encounter for closed fracture: Secondary | ICD-10-CM | POA: Diagnosis not present

## 2018-03-17 DIAGNOSIS — N189 Chronic kidney disease, unspecified: Secondary | ICD-10-CM | POA: Diagnosis not present

## 2018-03-17 DIAGNOSIS — R531 Weakness: Secondary | ICD-10-CM | POA: Diagnosis not present

## 2018-03-17 DIAGNOSIS — Z981 Arthrodesis status: Secondary | ICD-10-CM | POA: Diagnosis not present

## 2018-03-17 DIAGNOSIS — K7460 Unspecified cirrhosis of liver: Secondary | ICD-10-CM | POA: Diagnosis not present

## 2018-03-17 DIAGNOSIS — Z794 Long term (current) use of insulin: Secondary | ICD-10-CM

## 2018-03-17 DIAGNOSIS — H409 Unspecified glaucoma: Secondary | ICD-10-CM | POA: Diagnosis present

## 2018-03-17 DIAGNOSIS — Z419 Encounter for procedure for purposes other than remedying health state, unspecified: Secondary | ICD-10-CM

## 2018-03-17 DIAGNOSIS — E7800 Pure hypercholesterolemia, unspecified: Secondary | ICD-10-CM | POA: Diagnosis not present

## 2018-03-17 DIAGNOSIS — N183 Chronic kidney disease, stage 3 (moderate): Secondary | ICD-10-CM | POA: Diagnosis not present

## 2018-03-17 HISTORY — DX: Acute kidney failure, unspecified: N17.9

## 2018-03-17 HISTORY — DX: Chronic kidney disease, unspecified: N18.9

## 2018-03-17 LAB — CBC WITH DIFFERENTIAL/PLATELET
BASOS ABS: 0.1 10*3/uL (ref 0–0.1)
Basophils Relative: 1 %
EOS ABS: 0.3 10*3/uL (ref 0–0.7)
EOS PCT: 3 %
HCT: 39.6 % (ref 35.0–47.0)
HEMOGLOBIN: 12.4 g/dL (ref 12.0–16.0)
LYMPHS ABS: 1 10*3/uL (ref 1.0–3.6)
Lymphocytes Relative: 10 %
MCH: 33.2 pg (ref 26.0–34.0)
MCHC: 31.4 g/dL — ABNORMAL LOW (ref 32.0–36.0)
MCV: 105.9 fL — ABNORMAL HIGH (ref 80.0–100.0)
Monocytes Absolute: 0.7 10*3/uL (ref 0.2–0.9)
Monocytes Relative: 7 %
NEUTROS PCT: 79 %
Neutro Abs: 7.9 10*3/uL — ABNORMAL HIGH (ref 1.4–6.5)
Platelets: 193 10*3/uL (ref 150–440)
RBC: 3.73 MIL/uL — AB (ref 3.80–5.20)
RDW: 18.1 % — ABNORMAL HIGH (ref 11.5–14.5)
WBC: 10 10*3/uL (ref 3.6–11.0)

## 2018-03-17 LAB — GLUCOSE, CAPILLARY
Glucose-Capillary: 129 mg/dL — ABNORMAL HIGH (ref 70–99)
Glucose-Capillary: 219 mg/dL — ABNORMAL HIGH (ref 70–99)

## 2018-03-17 LAB — COMPREHENSIVE METABOLIC PANEL
ALT: 23 U/L (ref 0–44)
AST: 44 U/L — ABNORMAL HIGH (ref 15–41)
Albumin: 4.3 g/dL (ref 3.5–5.0)
Alkaline Phosphatase: 92 U/L (ref 38–126)
Anion gap: 17 — ABNORMAL HIGH (ref 5–15)
BILIRUBIN TOTAL: 1.4 mg/dL — AB (ref 0.3–1.2)
BUN: 67 mg/dL — ABNORMAL HIGH (ref 8–23)
CHLORIDE: 101 mmol/L (ref 98–111)
CO2: 12 mmol/L — ABNORMAL LOW (ref 22–32)
CREATININE: 5.2 mg/dL — AB (ref 0.44–1.00)
Calcium: 9.8 mg/dL (ref 8.9–10.3)
GFR calc Af Amer: 8 mL/min — ABNORMAL LOW (ref >60)
GFR, EST NON AFRICAN AMERICAN: 7 mL/min — AB (ref >60)
Glucose, Bld: 286 mg/dL — ABNORMAL HIGH (ref 70–99)
POTASSIUM: 5.3 mmol/L — AB (ref 3.5–5.1)
Sodium: 130 mmol/L — ABNORMAL LOW (ref 135–145)
TOTAL PROTEIN: 8.2 g/dL — AB (ref 6.5–8.1)

## 2018-03-17 LAB — URINALYSIS, COMPLETE (UACMP) WITH MICROSCOPIC
BACTERIA UA: NONE SEEN
BILIRUBIN URINE: NEGATIVE
Glucose, UA: NEGATIVE mg/dL
Hgb urine dipstick: NEGATIVE
KETONES UR: NEGATIVE mg/dL
LEUKOCYTES UA: NEGATIVE
Nitrite: NEGATIVE
PH: 5 (ref 5.0–8.0)
Protein, ur: NEGATIVE mg/dL
Specific Gravity, Urine: 1.014 (ref 1.005–1.030)

## 2018-03-17 LAB — CREATININE, SERUM
Creatinine, Ser: 5.2 mg/dL — ABNORMAL HIGH (ref 0.44–1.00)
GFR calc non Af Amer: 7 mL/min — ABNORMAL LOW (ref >60)
GFR, EST AFRICAN AMERICAN: 8 mL/min — AB (ref >60)

## 2018-03-17 MED ORDER — SIMVASTATIN 20 MG PO TABS
20.0000 mg | ORAL_TABLET | Freq: Every day | ORAL | Status: DC
  Administered 2018-03-17 – 2018-03-20 (×4): 20 mg via ORAL
  Filled 2018-03-17 (×5): qty 1

## 2018-03-17 MED ORDER — FERROUS FUMARATE 324 (106 FE) MG PO TABS
1.0000 | ORAL_TABLET | Freq: Two times a day (BID) | ORAL | Status: DC
  Administered 2018-03-17 – 2018-03-21 (×8): 106 mg via ORAL
  Filled 2018-03-17 (×9): qty 1

## 2018-03-17 MED ORDER — SODIUM CHLORIDE 0.9 % IV SOLN
INTRAVENOUS | Status: DC
  Administered 2018-03-17 – 2018-03-21 (×8): via INTRAVENOUS

## 2018-03-17 MED ORDER — MORPHINE SULFATE (PF) 4 MG/ML IV SOLN
4.0000 mg | Freq: Once | INTRAVENOUS | Status: AC
  Administered 2018-03-17: 4 mg via INTRAVENOUS
  Filled 2018-03-17: qty 1

## 2018-03-17 MED ORDER — PANTOPRAZOLE SODIUM 40 MG PO TBEC
40.0000 mg | DELAYED_RELEASE_TABLET | Freq: Every day | ORAL | Status: DC
  Administered 2018-03-17 – 2018-03-21 (×5): 40 mg via ORAL
  Filled 2018-03-17 (×5): qty 1

## 2018-03-17 MED ORDER — VITAMIN D3 25 MCG (1000 UNIT) PO TABS
1000.0000 [IU] | ORAL_TABLET | Freq: Every day | ORAL | Status: DC
  Administered 2018-03-17 – 2018-03-21 (×5): 1000 [IU] via ORAL
  Filled 2018-03-17 (×10): qty 1

## 2018-03-17 MED ORDER — ENSURE ENLIVE PO LIQD
237.0000 mL | Freq: Two times a day (BID) | ORAL | Status: DC
  Administered 2018-03-18 – 2018-03-21 (×6): 237 mL via ORAL

## 2018-03-17 MED ORDER — SODIUM CHLORIDE 0.9 % IV SOLN
Freq: Once | INTRAVENOUS | Status: AC
  Administered 2018-03-17: 14:00:00 via INTRAVENOUS

## 2018-03-17 MED ORDER — SODIUM CHLORIDE 0.9 % IV BOLUS
1000.0000 mL | Freq: Once | INTRAVENOUS | Status: AC
  Administered 2018-03-17: 1000 mL via INTRAVENOUS

## 2018-03-17 MED ORDER — INSULIN ASPART 100 UNIT/ML ~~LOC~~ SOLN
0.0000 [IU] | Freq: Three times a day (TID) | SUBCUTANEOUS | Status: DC
  Administered 2018-03-17: 1 [IU] via SUBCUTANEOUS
  Administered 2018-03-18 – 2018-03-19 (×3): 2 [IU] via SUBCUTANEOUS
  Administered 2018-03-19: 3 [IU] via SUBCUTANEOUS
  Administered 2018-03-19: 5 [IU] via SUBCUTANEOUS
  Administered 2018-03-20: 1 [IU] via SUBCUTANEOUS
  Administered 2018-03-20 – 2018-03-21 (×3): 2 [IU] via SUBCUTANEOUS
  Administered 2018-03-21: 3 [IU] via SUBCUTANEOUS
  Filled 2018-03-17 (×12): qty 1

## 2018-03-17 MED ORDER — LORATADINE 10 MG PO TABS
10.0000 mg | ORAL_TABLET | Freq: Every day | ORAL | Status: DC
  Administered 2018-03-17 – 2018-03-21 (×5): 10 mg via ORAL
  Filled 2018-03-17 (×5): qty 1

## 2018-03-17 MED ORDER — OMEGA-3-ACID ETHYL ESTERS 1 G PO CAPS
1.0000 g | ORAL_CAPSULE | Freq: Every day | ORAL | Status: DC
  Administered 2018-03-17 – 2018-03-18 (×2): 1 g via ORAL
  Filled 2018-03-17 (×2): qty 1

## 2018-03-17 MED ORDER — DOCUSATE SODIUM 100 MG PO CAPS
100.0000 mg | ORAL_CAPSULE | Freq: Two times a day (BID) | ORAL | Status: DC | PRN
  Administered 2018-03-20: 100 mg via ORAL
  Filled 2018-03-17: qty 1

## 2018-03-17 MED ORDER — ASPIRIN EC 81 MG PO TBEC
81.0000 mg | DELAYED_RELEASE_TABLET | Freq: Every day | ORAL | Status: DC
  Administered 2018-03-17 – 2018-03-21 (×5): 81 mg via ORAL
  Filled 2018-03-17 (×5): qty 1

## 2018-03-17 MED ORDER — HEPARIN SODIUM (PORCINE) 5000 UNIT/ML IJ SOLN
5000.0000 [IU] | Freq: Three times a day (TID) | INTRAMUSCULAR | Status: DC
  Administered 2018-03-17 – 2018-03-19 (×5): 5000 [IU] via SUBCUTANEOUS
  Filled 2018-03-17 (×6): qty 1

## 2018-03-17 MED ORDER — MORPHINE SULFATE (PF) 2 MG/ML IV SOLN
2.0000 mg | INTRAVENOUS | Status: DC | PRN
  Administered 2018-03-18 – 2018-03-20 (×6): 2 mg via INTRAVENOUS
  Filled 2018-03-17 (×6): qty 1

## 2018-03-17 MED ORDER — ONDANSETRON HCL 4 MG/2ML IJ SOLN
4.0000 mg | Freq: Once | INTRAMUSCULAR | Status: AC
  Administered 2018-03-17: 4 mg via INTRAVENOUS
  Filled 2018-03-17: qty 2

## 2018-03-17 MED ORDER — VITAMIN B-12 1000 MCG PO TABS
1000.0000 ug | ORAL_TABLET | Freq: Every day | ORAL | Status: DC
  Administered 2018-03-17 – 2018-03-21 (×5): 1000 ug via ORAL
  Filled 2018-03-17 (×5): qty 1

## 2018-03-17 NOTE — ED Triage Notes (Signed)
Pt via pov from [place]; she was there for CT but her creatinine was 5.2 so they sent her here per [place]. Pt alert & oriented; moaning in pain during triage, states her back hurts; has hx of T12 rupture.

## 2018-03-17 NOTE — ED Notes (Signed)
Patient transported to CT 

## 2018-03-17 NOTE — Patient Outreach (Signed)
[facility]) Care Management  03/17/2018  WENDELIN READER 03/30/1941 [id number]   Patient noted to be admitted.  CM will follow progress while hospitalized.    Plan: RN CM will attempt outreach after discharge disposition known.    Jone Baseman, RN, MSN [place] Management Care Management Coordinator Direct Line [phone] Cell [phone] Toll Free: [phone]  Fax: [phone]

## 2018-03-17 NOTE — Progress Notes (Signed)
Family Meeting Note  Advance Directive:yes  Today a meeting took place with the Patient and 2 sons.  The following clinical team members were present during this meeting:MD  The following were discussed:Patient's diagnosis: renal mass, ac renal failure, Patient's progosis: Unable to determine and Goals for treatment: DNR  Additional follow-up to be provided: Urology, nephrology  Time spent during discussion:20 minutes  Vaughan Basta, MD

## 2018-03-17 NOTE — ED Provider Notes (Signed)
[facility] Emergency Department Provider Note  Time seen: 12:29 PM  I have reviewed the triage vital signs and the nursing notes.   HISTORY  Chief Complaint Abnormal Lab    HPI Emily Faulkner is a 77 y.o. female with a past medical history of breast cancer status post meniscectomy, diabetes, hypertension, hyperlipidemia, presents to the emergency department for abnormal blood work.  According to the patient and report patient was scheduled to have a CT scan performed today had blood work performed prior to the CT scan which showed a creatinine greater than 5.  Patient states over the past 2 weeks ever since having a colonoscopy/endoscopy she has felt extremely weak and fatigued with nausea and no appetite.  She is also complaining of back pain which is why the CT scan was ordered.  Describes back pain is 8/10 midline and off to the left side.  Worse with movement.  Patient denies any decreased urination dysuria or foul odor.  No fever.  No current diarrhea she does state nausea but denies vomiting.   Past Medical History:  Diagnosis Date  . Cancer [facility])    breast cancer right mastectomy  . Diabetes mellitus without complication ([place])   . Glaucoma   . Hyperlipidemia   . Hypertension   . Neuropathy   . Osteoarthritis   . Rheumatoid arthritis ([place])   . Telangiectasias     Patient Active Problem List   Diagnosis Date Noted  . GIB (gastrointestinal bleeding) 02/20/2018  . Microalbuminuric diabetic nephropathy ([place]) 11/02/2016  . Diabetes mellitus, type 2 ([place]) 11/02/2016  . Diabetic neuropathy, type II diabetes mellitus ([place]) 11/02/2016  . Pulmonary hypertension ([place]) 09/11/2016  . Shortness of breath 09/06/2016  . Hypertension 09/06/2016  . Hyperkalemia 09/06/2016  . High cholesterol 09/06/2016  . Anemia 09/06/2016  . Diabetes mellitus ([place]) 09/06/2016  . Iron deficiency anemia 04/15/2016  . Type 2 diabetes mellitus with neurological manifestations,  uncontrolled ([place]) 08/04/2014  . Hyperlipidemia, unspecified 01/22/2014  . Personal history of breast cancer 07/17/2012    Past Surgical History:  Procedure Laterality Date  . BREAST SURGERY     right mastectomy  . CHOLECYSTECTOMY    . COLONOSCOPY    . COLONOSCOPY WITH PROPOFOL N/A 08/14/2016   Procedure: COLONOSCOPY WITH PROPOFOL;  Surgeon: Lollie Sails, MD;  Location: [facility] ENDOSCOPY;  Service: Endoscopy;  Laterality: N/A;  . COLONOSCOPY WITH PROPOFOL N/A 02/22/2018   Procedure: COLONOSCOPY WITH PROPOFOL;  Surgeon: Jonathon Bellows, MD;  Location: [facility] ENDOSCOPY;  Service: Gastroenterology;  Laterality: N/A;  . ESOPHAGOGASTRODUODENOSCOPY (EGD) WITH PROPOFOL N/A 08/14/2016   Procedure: ESOPHAGOGASTRODUODENOSCOPY (EGD) WITH PROPOFOL;  Surgeon: Lollie Sails, MD;  Location: [facility] ENDOSCOPY;  Service: Endoscopy;  Laterality: N/A;  . ESOPHAGOGASTRODUODENOSCOPY (EGD) WITH PROPOFOL N/A 01/30/2018   Procedure: ESOPHAGOGASTRODUODENOSCOPY (EGD) WITH PROPOFOL;  Surgeon: Toledo, Benay Pike, MD;  Location: [facility] ENDOSCOPY;  Service: Gastroenterology;  Laterality: N/A;  . ESOPHAGOGASTRODUODENOSCOPY (EGD) WITH PROPOFOL N/A 02/22/2018   Procedure: ESOPHAGOGASTRODUODENOSCOPY (EGD) WITH PROPOFOL;  Surgeon: Jonathon Bellows, MD;  Location: [facility] ENDOSCOPY;  Service: Gastroenterology;  Laterality: N/A;  . EXCISIONAL HEMORRHOIDECTOMY      Prior to Admission medications   Medication Sig Start Date End Date Taking? Authorizing Provider  ACCU-CHEK AVIVA PLUS test strip  02/04/18   [provider]  aspirin 81 MG EC tablet Take 81 mg by mouth daily.     [provider]  cephALEXin (KEFLEX) 500 MG capsule Take 1 capsule (500 mg total) by mouth every  12 (twelve) hours. 02/26/18   Fritzi Mandes, MD  clindamycin (CLEOCIN T) 1 % external solution Apply 1 application topically 2 (two) times daily. 12/31/17   [provider]  clobetasol (TEMOVATE) 0.05 % external solution Apply 1 application topically every  evening. (apply to scalp) 01/25/18   [provider]  Ergocalciferol (VITAMIN D2) 2000 units TABS Take 1 tablet by mouth daily.    [provider]  feeding supplement, ENSURE ENLIVE, (ENSURE ENLIVE) LIQD Take 237 mLs by mouth 2 (two) times daily between meals. 02/26/18   Fritzi Mandes, MD  ferrous fumarate (HEMOCYTE - 106 MG FE) 325 (106 Fe) MG TABS tablet Take 1 tablet (106 mg of iron total) by mouth 2 (two) times daily. 01/31/18   Hillary Bow, MD  furosemide (LASIX) 40 MG tablet Take 1 tablet (40 mg total) by mouth 2 (two) times daily. 01/31/18   Hillary Bow, MD  glipiZIDE (GLUCOTROL) 10 MG tablet Take 10 mg by mouth daily.     [provider]  hydrOXYzine (ATARAX/VISTARIL) 10 MG tablet Take 10 mg by mouth at bedtime. 11/11/17   [provider]  Insulin Glargine (LANTUS SOLOSTAR) 100 UNIT/ML Solostar Pen Inject 36 Units into the skin daily. (may take up to 50u based upon blood glucose reading)    [provider]  ketoconazole (NIZORAL) 2 % shampoo Apply 1 application topically daily. (apply to scalp and wait at least 30 minutes before rinsing) 01/25/18   [provider]  lisinopril (PRINIVIL,ZESTRIL) 2.5 MG tablet Take 2.5 mg by mouth daily.     [provider]  loratadine (CLARITIN) 10 MG tablet Take 10 mg by mouth daily.    [provider]  magnesium oxide (MAG-OX) 400 MG tablet Take 800 mg by mouth daily. 10/02/17   [provider]  niacin (NIASPAN) 500 MG CR tablet Take 500 mg by mouth every evening. 12/25/17   [provider]  Omega-3 Fatty Acids (FISH OIL) 1000 MG CAPS Take 1 capsule by mouth.     [provider]  pantoprazole (PROTONIX) 40 MG tablet Take 1 tablet (40 mg total) by mouth daily. 02/26/18   Fritzi Mandes, MD  simvastatin (ZOCOR) 20 MG tablet Take 20 mg by mouth at bedtime. 12/23/17   [provider]  sitaGLIPtin (JANUVIA) 50 MG tablet Take 50 mg by mouth daily.    [provider]  traMADol (ULTRAM) 50 MG tablet Take 1 tablet (50 mg total) by mouth every 8 (eight) hours as needed for severe pain. 02/26/18   Fritzi Mandes, MD  TRAVATAN Z 0.004 % SOLN ophthalmic solution Place 1 drop into the left eye at bedtime. 12/05/17   [provider]  vitamin B-12 (CYANOCOBALAMIN) 1000 MCG tablet Take 1,000 mcg by mouth daily.    [provider]    No Known Allergies  Family History  Problem Relation Age of Onset  . Kidney cancer Son   . Bladder Cancer Neg Hx     Social History Social History   Tobacco Use  . Smoking status: Former Smoker    Last attempt to quit: 07/10/2011    Years since quitting: 6.6  . Smokeless tobacco: Never Used  Substance Use Topics  . Alcohol use: No  . Drug use: No    Review of Systems Constitutional: Negative for fever.  Positive for generalized fatigue/weakness. Cardiovascular: Negative for chest pain. Respiratory: Negative for shortness of breath. Gastrointestinal: Negative for abdominal pain.  Positive for nausea.  Negative for vomiting  or diarrhea. Genitourinary: Negative for urinary compaints Musculoskeletal: Negative for musculoskeletal complaints Skin: Negative for skin complaints  Neurological: Negative for headache All other ROS negative  ____________________________________________   PHYSICAL EXAM:  VITAL SIGNS: ED Triage Vitals  Enc Vitals Group     BP 03/17/18 1122 (!) 175/54     Pulse Rate 03/17/18 1122 (!) 105     Resp 03/17/18 1122 18     Temp 03/17/18 1122 98.1 F (36.7 C)     Temp Source 03/17/18 1122 Oral     SpO2 03/17/18 1122 100 %     Weight 03/17/18 1123 173 lb 1 oz (78.5 kg)     Height 03/17/18 1123 5' 4"  (1.626 m)     Head Circumference --      Peak Flow --      Pain Score 03/17/18 1123 9     Pain Loc --      Pain Edu? --      Excl. in [place]? --     Constitutional: Alert and oriented. Well appearing and in no distress. Eyes: Normal exam ENT   Head: Normocephalic  and atraumatic   Mouth/Throat: Mucous membranes are moist. Cardiovascular: Normal rate, regular rhythm.  Respiratory: Normal respiratory effort without tachypnea nor retractions. Breath sounds are clear  Gastrointestinal: Soft and nontender. No distention.   Musculoskeletal: Nontender with normal range of motion in all extremities.  Neurologic:  Normal speech and language. No gross focal neurologic deficits  Skin:  Skin is warm, dry and intact.  Psychiatric: Mood and affect are normal.   ____________________________________________   RADIOLOGY  CT shows subacute T12 compression fracture.  Unchanged left renal lesion possible neoplasm.  ____________________________________________   INITIAL IMPRESSION / ASSESSMENT AND PLAN / ED COURSE  Pertinent labs & imaging results that were available during my care of the patient were reviewed by me and considered in my medical decision making (see chart for details).  Patient presents to the emergency for renal failure/abnormal lab.  Patient does state nausea with generalized fatigue weakness which could indicate renal dysfunction as well.  We will recheck labs to ensure accuracy.  Will obtain a CT renal scan of the patient to rule out kidney stone or obstructive uropathy we will also obtain a bladder scan to rule out urinary retention.  Family states approximately 1 year ago patient had to be admitted to [facility] for similar issues with renal dysfunction following a colonoscopy.  Patient's labs are resulted showing renal insufficiency/failure with a creatinine of 5.2.  We will begin IV hydration.  CT does show a T12 compression fracture and a left renal lesion.  Discussed the findings with the patient.  Patient will be admitted to the hospitalist service for continued work-up.  Patient agreeable to plan of care. ____________________________________________   FINAL CLINICAL IMPRESSION(S) / ED DIAGNOSES  Acute renal failure Compression fracture    Harvest Dark, MD 03/17/18 1335

## 2018-03-17 NOTE — Telephone Encounter (Signed)
So they did need to change the patient's CT to a WO they took a verbal from me over the phone and they have changed the order in the system if you can just sign it when you get a chance. Her creatinine was 5.20 Thanks, Sharyn Lull

## 2018-03-17 NOTE — H&P (Signed)
[place] [facility] [place] NAME: Emily Faulkner    MR#:  [id number]  DATE OF BIRTH:  [birth year]  DATE OF ADMISSION:  03/17/2018  PRIMARY CARE PHYSICIAN: Sofie Hartigan, MD   REQUESTING/REFERRING PHYSICIAN: [email or web address]  CHIEF COMPLAINT:   Chief Complaint  Patient presents with  . Abnormal Lab    HISTORY OF PRESENT ILLNESS: Emily Faulkner  is a 77 y.o. female with a known history of breast cancer status post mastectomy, chronic kidney disease, diabetes, hyperlipidemia, hypertension, rheumatoid arthritis-was found to have a right renal mass and follows with urology clinic for that. She was advised to have outpatient CT scan today by urologist. At the clinic she was noted to have worsening in the renal function on blood work and so they spoke to urologist on the phone, she suggested to go to emergency room right away for admission. On further questioning patient confirms, she was admitted last month to the hospital with AVMs on colon. As per patient, for last few weeks she is losing appetite and losing her strength and not able to walk much now. Urologist has been just following her kidney mass, no biopsies done so far and as per patient she was never told it is cancer. For last few weeks she also have worsening back pain, which is not on any side but in the center and lower part, she had gone to [facility], they noted there is a compression fracture but did not do any further interventions for that.  PAST MEDICAL HISTORY:   Past Medical History:  Diagnosis Date  . Cancer [facility])    breast cancer right mastectomy  . Chronic kidney disease   . Diabetes mellitus without complication ([place])   . Glaucoma   . Hyperlipidemia   . Hypertension   . Neuropathy   . Osteoarthritis   . Rheumatoid arthritis ([place])   . Telangiectasias     PAST SURGICAL HISTORY:  Past Surgical History:  Procedure Laterality Date  . BREAST SURGERY     right mastectomy  .  CHOLECYSTECTOMY    . COLONOSCOPY    . COLONOSCOPY WITH PROPOFOL N/A 08/14/2016   Procedure: COLONOSCOPY WITH PROPOFOL;  Surgeon: Lollie Sails, MD;  Location: [facility] ENDOSCOPY;  Service: Endoscopy;  Laterality: N/A;  . COLONOSCOPY WITH PROPOFOL N/A 02/22/2018   Procedure: COLONOSCOPY WITH PROPOFOL;  Surgeon: Jonathon Bellows, MD;  Location: [facility] ENDOSCOPY;  Service: Gastroenterology;  Laterality: N/A;  . ESOPHAGOGASTRODUODENOSCOPY (EGD) WITH PROPOFOL N/A 08/14/2016   Procedure: ESOPHAGOGASTRODUODENOSCOPY (EGD) WITH PROPOFOL;  Surgeon: Lollie Sails, MD;  Location: [facility] ENDOSCOPY;  Service: Endoscopy;  Laterality: N/A;  . ESOPHAGOGASTRODUODENOSCOPY (EGD) WITH PROPOFOL N/A 01/30/2018   Procedure: ESOPHAGOGASTRODUODENOSCOPY (EGD) WITH PROPOFOL;  Surgeon: Toledo, Benay Pike, MD;  Location: [facility] ENDOSCOPY;  Service: Gastroenterology;  Laterality: N/A;  . ESOPHAGOGASTRODUODENOSCOPY (EGD) WITH PROPOFOL N/A 02/22/2018   Procedure: ESOPHAGOGASTRODUODENOSCOPY (EGD) WITH PROPOFOL;  Surgeon: Jonathon Bellows, MD;  Location[facility] ENDOSCOPY;  Service: Gastroenterology;  Laterality: N/A;  . EXCISIONAL HEMORRHOIDECTOMY      SOCIAL HISTORY:  Social History   Tobacco Use  . Smoking status: Former Smoker    Last attempt to quit: 07/10/2011    Years since quitting: 6.6  . Smokeless tobacco: Never Used  Substance Use Topics  . Alcohol use: No    FAMILY HISTORY:  Family History  Problem Relation Age of Onset  . Kidney cancer Son   . Bladder Cancer Neg Hx     DRUG ALLERGIES: No  Known Allergies  REVIEW OF SYSTEMS:   CONSTITUTIONAL: No fever,have fatigue or weakness.  EYES: No blurred or double vision.  EARS, NOSE, AND THROAT: No tinnitus or ear pain.  RESPIRATORY: No cough, shortness of breath, wheezing or hemoptysis.  CARDIOVASCULAR: No chest pain, orthopnea, edema.  GASTROINTESTINAL: No nausea, vomiting, diarrhea or abdominal pain.  GENITOURINARY: No dysuria, hematuria.  ENDOCRINE: No polyuria, nocturia,   HEMATOLOGY: No anemia, easy bruising or bleeding SKIN: No rash or lesion. MUSCULOSKELETAL: No joint pain or arthritis.   NEUROLOGIC: No tingling, numbness, weakness.  PSYCHIATRY: No anxiety or depression.   MEDICATIONS AT HOME:  Prior to Admission medications   Medication Sig Start Date End Date Taking? Authorizing Provider  aspirin 81 MG EC tablet Take 81 mg by mouth daily.    Yes [provider]  clindamycin (CLEOCIN T) 1 % external solution Apply 1 application topically 2 (two) times daily. 12/31/17  Yes [provider]  clobetasol (TEMOVATE) 0.05 % external solution Apply 1 application topically every evening. (apply to scalp) 01/25/18  Yes [provider]  Ergocalciferol (VITAMIN D2) 2000 units TABS Take 1 tablet by mouth daily.   Yes [provider]  feeding supplement, ENSURE ENLIVE, (ENSURE ENLIVE) LIQD Take 237 mLs by mouth 2 (two) times daily between meals. 02/26/18  Yes Fritzi Mandes, MD  ferrous fumarate (HEMOCYTE - 106 MG FE) 325 (106 Fe) MG TABS tablet Take 1 tablet (106 mg of iron total) by mouth 2 (two) times daily. 01/31/18  Yes Sudini, Alveta Heimlich, MD  furosemide (LASIX) 40 MG tablet Take 1 tablet (40 mg total) by mouth 2 (two) times daily. 01/31/18  Yes Sudini, Alveta Heimlich, MD  glipiZIDE (GLUCOTROL) 10 MG tablet Take 10 mg by mouth daily.    Yes [provider]  hydrOXYzine (ATARAX/VISTARIL) 10 MG tablet Take 10 mg by mouth at bedtime. 11/11/17  Yes [provider]  Insulin Glargine (LANTUS SOLOSTAR) 100 UNIT/ML Solostar Pen Inject 36 Units into the skin daily. (may take up to 50u based upon blood glucose reading)   Yes [provider]  ketoconazole (NIZORAL) 2 % shampoo Apply 1 application topically daily. (apply to scalp and wait at least 30 minutes before rinsing) 01/25/18  Yes [provider]  lisinopril (PRINIVIL,ZESTRIL) 2.5 MG tablet Take 2.5 mg by mouth daily.    Yes [provider]  loratadine (CLARITIN)  10 MG tablet Take 10 mg by mouth daily.   Yes [provider]  magnesium oxide (MAG-OX) 400 MG tablet Take 800 mg by mouth daily. 10/02/17  Yes [provider]  methocarbamol (ROBAXIN) 500 MG tablet Take 500-1,500 mg by mouth See admin instructions. On day 1-3 take 1500 mg by mouth three times daily, on days 4-6 take 1000 mg by mouth three times daily, then take 1000 mg by mouth three times daily as needed. 03/12/18  Yes [provider]  niacin (NIASPAN) 500 MG CR tablet Take 500 mg by mouth every evening. 12/25/17  Yes [provider]  Omega-3 Fatty Acids (FISH OIL) 1000 MG CAPS Take 1 capsule by mouth.    Yes [provider]  pantoprazole (PROTONIX) 40 MG tablet Take 1 tablet (40 mg total) by mouth daily. 02/26/18  Yes Fritzi Mandes, MD  potassium chloride SA (K-DUR,KLOR-CON) 20 MEQ tablet Take 20 mEq by mouth 2 (two) times daily. 03/11/18  Yes [provider]  simvastatin (ZOCOR) 20 MG tablet Take 20 mg by mouth at bedtime. 12/23/17  Yes [provider]  sitaGLIPtin (  JANUVIA) 50 MG tablet Take 50 mg by mouth daily.   Yes [provider]  traMADol (ULTRAM) 50 MG tablet Take 1 tablet (50 mg total) by mouth every 8 (eight) hours as needed for severe pain. 02/26/18  Yes Fritzi Mandes, MD  TRAVATAN Z 0.004 % SOLN ophthalmic solution Place 1 drop into the left eye at bedtime. 12/05/17  Yes [provider]  vitamin B-12 (CYANOCOBALAMIN) 1000 MCG tablet Take 1,000 mcg by mouth daily.   Yes [provider]      PHYSICAL EXAMINATION:   VITAL SIGNS: Blood pressure 107/61, pulse 96, temperature 98.1 F (36.7 C), temperature source Oral, resp. rate 16, height _0  (1.626 m), weight 78.5 kg, SpO2 98 %.  GENERAL:  77 y.o.-year-old patient lying in the bed with no acute distress.  EYES: Pupils equal, round, reactive to light and accommodation. No scleral icterus. Extraocular muscles intact.  HEENT: Head atraumatic, normocephalic.  Oropharynx and nasopharynx clear.  NECK:  Supple, no jugular venous distention. No thyroid enlargement, no tenderness.  LUNGS: Normal breath sounds bilaterally, no wheezing, rales,rhonchi or crepitation. No use of accessory muscles of respiration.  CARDIOVASCULAR: S1, S2 normal. No murmurs, rubs, or gallops.  ABDOMEN: Soft, nontender, nondistended. Bowel sounds present. No organomegaly or mass.  EXTREMITIES: No pedal edema, cyanosis, or clubbing.  Tenderness in lower back which is spanning across left to right side. NEUROLOGIC: Cranial nerves II through XII are intact. Muscle strength 5/5 in all extremities. Sensation intact. Gait not checked.  PSYCHIATRIC: The patient is alert and oriented x 3.  SKIN: No obvious rash, lesion, or ulcer.   LABORATORY PANEL:   CBC Recent Labs  Lab 03/17/18 1154  WBC 10.0  HGB 12.4  HCT 39.6  PLT 193  MCV 105.9*  MCH 33.2  MCHC 31.4*  RDW 18.1*  LYMPHSABS 1.0  MONOABS 0.7  EOSABS 0.3  BASOSABS 0.1   ------------------------------------------------------------------------------------------------------------------  Chemistries  Recent Labs  Lab 03/17/18 1021 03/17/18 1154  NA  --  130*  K  --  5.3*  CL  --  101  CO2  --  12*  GLUCOSE  --  286*  BUN  --  67*  CREATININE 5.20* 5.20*  CALCIUM  --  9.8  AST  --  44*  ALT  --  23  ALKPHOS  --  92  BILITOT  --  1.4*   ------------------------------------------------------------------------------------------------------------------ estimated creatinine clearance is 9.2 mL/min (A) (by C-G formula based on SCr of 5.2 mg/dL (H)). ------------------------------------------------------------------------------------------------------------------ No results for input(s): TSH, T4TOTAL, T3FREE, THYROIDAB in the last 72 hours.  Invalid input(s): FREET3   Coagulation profile No results for input(s): INR, PROTIME in the last 168  hours. ------------------------------------------------------------------------------------------------------------------- No results for input(s): DDIMER in the last 72 hours. -------------------------------------------------------------------------------------------------------------------  Cardiac Enzymes No results for input(s): CKMB, TROPONINI, MYOGLOBIN in the last 168 hours.  Invalid input(s): CK ------------------------------------------------------------------------------------------------------------------ Invalid input(s): POCBNP  ---------------------------------------------------------------------------------------------------------------  Urinalysis    Component Value Date/Time   COLORURINE YELLOW (A) 02/23/2018 1720   APPEARANCEUR CLEAR (A) 02/23/2018 1720   LABSPEC 1.013 02/23/2018 1720   PHURINE 5.0 02/23/2018 1720   GLUCOSEU 150 (A) 02/23/2018 1720   HGBUR NEGATIVE 02/23/2018 1720   BILIRUBINUR NEGATIVE 02/23/2018 1720   KETONESUR NEGATIVE 02/23/2018 1720   PROTEINUR NEGATIVE 02/23/2018 1720   NITRITE NEGATIVE 02/23/2018 1720   LEUKOCYTESUR TRACE (A) 02/23/2018 1720     RADIOLOGY: Ct Renal Stone Study  Result Date: 03/17/2018 CLINICAL DATA:  Low back pain and nausea for  the past 2 months. EXAM: CT ABDOMEN AND PELVIS WITHOUT CONTRAST TECHNIQUE: Multidetector CT imaging of the abdomen and pelvis was performed following the standard protocol without IV contrast. COMPARISON:  Chest x-ray dated January 29, 2018. CT abdomen dated January 30, 2017. FINDINGS: Lower chest: No acute abnormality. Unchanged mild subpleural reticulation at both lung bases. Hepatobiliary: Unchanged cirrhosis. No focal liver abnormality. Status post cholecystectomy. No biliary dilatation. Pancreas: Unremarkable. No pancreatic ductal dilatation or surrounding inflammatory changes. Spleen: Stable mild splenomegaly.  No focal abnormality. Adrenals/Urinary Tract: The adrenal glands and right kidney are  unremarkable. Grossly unchanged 18 mm exophytic lesion arising from the upper pole of the left kidney. No renal or ureteral calculi. No hydronephrosis. The bladder is unremarkable. Stomach/Bowel: Stomach is within normal limits. Appendix appears normal. No evidence of bowel wall thickening, distention, or inflammatory changes. Mild colonic diverticulosis. Vascular/Lymphatic: Aortic atherosclerosis. Unchanged recanalized umbilical vein. No enlarged abdominal or pelvic lymph nodes. Reproductive: Uterus and bilateral adnexa are unremarkable. Other: No free fluid or pneumoperitoneum. Musculoskeletal: New subacute T12 compression fracture with 50% height loss and 5 mm retropulsion. Unchanged severe degenerative disc disease at L2-L3. IMPRESSION: 1. New subacute T12 compression fracture with 50% height loss and 5 mm retropulsion. 2. Grossly unchanged exophytic left renal lesion, concerning for renal neoplasm. 3. Unchanged cirrhosis and sequelae of portal hypertension mild splenomegaly. 4.  Aortic atherosclerosis (ICD10-I70.0). Electronically Signed   By: Titus Dubin M.D.   On: 03/17/2018 12:38    EKG: Orders placed or performed during the hospital encounter of 02/20/18  . ED EKG  . ED EKG  . EKG    IMPRESSION AND PLAN:  *Acute on chronic renal failure Renal mass  Give IV fluid and get nephrology and urology consult. Hold antihypertensive and nephrotoxic medications.   *Compression fracture on T12 Orthopedic consult for further management.  *Generalized weakness, fatigue, loss of appetite Renal mass is suspicious for cancer, urologist consult. As per patient, in the clinic urologist with monitoring the size of the mass but never had biopsy done for the diagnosis.  *Diabetes Hold oral diabetic medications, keep on sliding scale coverage with insulin.   All the records are reviewed and case discussed with ED provider. Management plans discussed with the patient, family and they are in  agreement.  CODE STATUS: DNR Code Status History    Date Active Date Inactive Code Status Order ID Comments User Context   02/20/2018 1713 02/26/2018 1851 Full Code [id number]  Demetrios Loll, MD ED   01/29/2018 1648 01/31/2018 1535 Full Code [id number]  Hillary Bow, MD ED    Advance Directive Documentation     Most Recent Value  Type of Advance Directive  Healthcare Power of Attorney  Pre-existing out of facility DNR order (yellow form or pink MOST form)  -  "MOST" Form in Place?  -     Patient and HER-2 sons were present in the room, discussed the plan with them.  TOTAL TIME TAKING CARE OF THIS PATIENT: 45 minutes.    Vaughan Basta M.D on 03/17/2018   Between 7am to 6pm - Pager - [phone]  After 6pm go to [email or web address] - password [email or web address] [facility]  Office  [phone]  CC: Primary care physician; Sofie Hartigan, MD   Note: This dictation was prepared with Dragon dictation along with smaller phrase technology. Any transcriptional errors that result from this process are unintentional.

## 2018-03-17 NOTE — Telephone Encounter (Signed)
Spoke with Susie at [facility] CT department to advise that patient needs to go to [facility] ER per Dr Erlene Quan due to creatinine of 5.2 drawn prior to CT scheduled today. Susie states that CT has not yet been performed. Advised that patient should go to the ER now & CT can be done there. Susie voices understanding & will relay information to the patient.

## 2018-03-18 ENCOUNTER — Ambulatory Visit: Payer: Medicare HMO | Admitting: Urology

## 2018-03-18 ENCOUNTER — Ambulatory Visit: Payer: Self-pay

## 2018-03-18 DIAGNOSIS — N2889 Other specified disorders of kidney and ureter: Secondary | ICD-10-CM

## 2018-03-18 LAB — BASIC METABOLIC PANEL
Anion gap: 8 (ref 5–15)
BUN: 58 mg/dL — AB (ref 8–23)
CHLORIDE: 109 mmol/L (ref 98–111)
CO2: 19 mmol/L — ABNORMAL LOW (ref 22–32)
Calcium: 9.3 mg/dL (ref 8.9–10.3)
Creatinine, Ser: 4.01 mg/dL — ABNORMAL HIGH (ref 0.44–1.00)
GFR, EST AFRICAN AMERICAN: 11 mL/min — AB (ref >60)
GFR, EST NON AFRICAN AMERICAN: 10 mL/min — AB (ref >60)
Glucose, Bld: 203 mg/dL — ABNORMAL HIGH (ref 70–99)
POTASSIUM: 4.6 mmol/L (ref 3.5–5.1)
SODIUM: 136 mmol/L (ref 135–145)

## 2018-03-18 LAB — GLUCOSE, CAPILLARY
Glucose-Capillary: 169 mg/dL — ABNORMAL HIGH (ref 70–99)
Glucose-Capillary: 148 mg/dL — ABNORMAL HIGH (ref 70–99)

## 2018-03-18 LAB — CBC
HEMATOCRIT: 32.7 % — AB (ref 35.0–47.0)
Hemoglobin: 10.7 g/dL — ABNORMAL LOW (ref 12.0–16.0)
MCH: 33.5 pg (ref 26.0–34.0)
MCHC: 32.9 g/dL (ref 32.0–36.0)
MCV: 102 fL — AB (ref 80.0–100.0)
PLATELETS: 170 10*3/uL (ref 150–440)
RBC: 3.21 MIL/uL — AB (ref 3.80–5.20)
RDW: 17.5 % — AB (ref 11.5–14.5)
WBC: 6.6 10*3/uL (ref 3.6–11.0)

## 2018-03-18 LAB — PROTEIN / CREATININE RATIO, URINE
CREATININE, URINE: 99 mg/dL
PROTEIN CREATININE RATIO: 0.18 mg/mg{creat} — AB (ref 0.00–0.15)
Total Protein, Urine: 18 mg/dL

## 2018-03-18 MED ORDER — BISACODYL 5 MG PO TBEC
5.0000 mg | DELAYED_RELEASE_TABLET | Freq: Every day | ORAL | Status: DC | PRN
  Administered 2018-03-20: 5 mg via ORAL
  Filled 2018-03-18: qty 1

## 2018-03-18 MED ORDER — ONDANSETRON HCL 4 MG/2ML IJ SOLN
4.0000 mg | Freq: Four times a day (QID) | INTRAMUSCULAR | Status: DC | PRN
  Administered 2018-03-18: 4 mg via INTRAVENOUS
  Filled 2018-03-18: qty 2

## 2018-03-18 MED ORDER — OXYCODONE-ACETAMINOPHEN 5-325 MG PO TABS
1.0000 | ORAL_TABLET | ORAL | Status: DC | PRN
  Administered 2018-03-18: 1 via ORAL
  Filled 2018-03-18: qty 1

## 2018-03-18 NOTE — Consult Note (Signed)
Urology Consult  I have been asked to see the patient by Dr. Bridgett Larsson, for evaluation and management of left renal mass.   History of Present Illness: Emily Faulkner is a 77 y.o. year old who has been followed by Dr. Erlene Quan since April 2018 for a 1.7 cm enhancing left renal mass.  She has undergone periodic imaging and a follow-up CT July 2018 showed a 1.9 x 1.8 cm Bosniak 3 cystic mass with a thick enhancing wall.  Based on age and medical comorbidities surveillance was elected.  She was last seen by urology on 02/07/2018 and a renal ultrasound showed slight increased size at 2 x 1.5 x 1.8 cm and a more solid appearance.  A CT of the abdomen with and without contrast was recommended and this was to be performed yesterday however she was found to have a significant bump in her creatinine to 5.2.  She was admitted for acute on chronic renal failure.  She has been seen by nephrology.  She did have a noncontrast CT of the abdomen which showed a stable 1.8 cm left upper pole renal lesion which had not significantly changed when compared with prior studies.  Past Medical History:  Diagnosis Date  . Cancer [facility])    breast cancer right mastectomy  . Chronic kidney disease   . Diabetes mellitus without complication ([place])   . Glaucoma   . Hyperlipidemia   . Hypertension   . Neuropathy   . Osteoarthritis   . Rheumatoid arthritis ([place])   . Telangiectasias     Past Surgical History:  Procedure Laterality Date  . BREAST SURGERY     right mastectomy  . CHOLECYSTECTOMY    . COLONOSCOPY    . COLONOSCOPY WITH PROPOFOL N/A 08/14/2016   Procedure: COLONOSCOPY WITH PROPOFOL;  Surgeon: Lollie Sails, MD;  Location: [facility] ENDOSCOPY;  Service: Endoscopy;  Laterality: N/A;  . COLONOSCOPY WITH PROPOFOL N/A 02/22/2018   Procedure: COLONOSCOPY WITH PROPOFOL;  Surgeon: Jonathon Bellows, MD;  Location: [facility] ENDOSCOPY;  Service: Gastroenterology;  Laterality: N/A;  . ESOPHAGOGASTRODUODENOSCOPY (EGD) WITH PROPOFOL N/A  08/14/2016   Procedure: ESOPHAGOGASTRODUODENOSCOPY (EGD) WITH PROPOFOL;  Surgeon: Lollie Sails, MD;  Location: [facility] ENDOSCOPY;  Service: Endoscopy;  Laterality: N/A;  . ESOPHAGOGASTRODUODENOSCOPY (EGD) WITH PROPOFOL N/A 01/30/2018   Procedure: ESOPHAGOGASTRODUODENOSCOPY (EGD) WITH PROPOFOL;  Surgeon: Toledo, Benay Pike, MD;  Location: [facility] ENDOSCOPY;  Service: Gastroenterology;  Laterality: N/A;  . ESOPHAGOGASTRODUODENOSCOPY (EGD) WITH PROPOFOL N/A 02/22/2018   Procedure: ESOPHAGOGASTRODUODENOSCOPY (EGD) WITH PROPOFOL;  Surgeon: Jonathon Bellows, MD;  Location: [facility] ENDOSCOPY;  Service: Gastroenterology;  Laterality: N/A;  . EXCISIONAL HEMORRHOIDECTOMY      Home Medications:  Current Meds  Medication Sig  . aspirin 81 MG EC tablet Take 81 mg by mouth daily.   . clindamycin (CLEOCIN T) 1 % external solution Apply 1 application topically 2 (two) times daily.  . clobetasol (TEMOVATE) 0.05 % external solution Apply 1 application topically every evening. (apply to scalp)  . Ergocalciferol (VITAMIN D2) 2000 units TABS Take 1 tablet by mouth daily.  . feeding supplement, ENSURE ENLIVE, (ENSURE ENLIVE) LIQD Take 237 mLs by mouth 2 (two) times daily between meals.  . ferrous fumarate (HEMOCYTE - 106 MG FE) 325 (106 Fe) MG TABS tablet Take 1 tablet (106 mg of iron total) by mouth 2 (two) times daily.  . furosemide (LASIX) 40 MG tablet Take 1 tablet (40 mg total) by mouth 2 (two) times daily.  Marland Kitchen glipiZIDE (GLUCOTROL) 10 MG  tablet Take 10 mg by mouth daily.   . hydrOXYzine (ATARAX/VISTARIL) 10 MG tablet Take 10 mg by mouth at bedtime.  . Insulin Glargine (LANTUS SOLOSTAR) 100 UNIT/ML Solostar Pen Inject 36 Units into the skin daily. (may take up to 50u based upon blood glucose reading)  . ketoconazole (NIZORAL) 2 % shampoo Apply 1 application topically daily. (apply to scalp and wait at least 30 minutes before rinsing)  . lisinopril (PRINIVIL,ZESTRIL) 2.5 MG tablet Take 2.5 mg by mouth daily.   Marland Kitchen loratadine  (CLARITIN) 10 MG tablet Take 10 mg by mouth daily.  . magnesium oxide (MAG-OX) 400 MG tablet Take 800 mg by mouth daily.  . methocarbamol (ROBAXIN) 500 MG tablet Take 500-1,500 mg by mouth See admin instructions. On day 1-3 take 1500 mg by mouth three times daily, on days 4-6 take 1000 mg by mouth three times daily, then take 1000 mg by mouth three times daily as needed.  . niacin (NIASPAN) 500 MG CR tablet Take 500 mg by mouth every evening.  . Omega-3 Fatty Acids (FISH OIL) 1000 MG CAPS Take 1 capsule by mouth.   . pantoprazole (PROTONIX) 40 MG tablet Take 1 tablet (40 mg total) by mouth daily.  . potassium chloride SA (K-DUR,KLOR-CON) 20 MEQ tablet Take 20 mEq by mouth 2 (two) times daily.  . simvastatin (ZOCOR) 20 MG tablet Take 20 mg by mouth at bedtime.  . sitaGLIPtin (JANUVIA) 50 MG tablet Take 50 mg by mouth daily.  . traMADol (ULTRAM) 50 MG tablet Take 1 tablet (50 mg total) by mouth every 8 (eight) hours as needed for severe pain.  . TRAVATAN Z 0.004 % SOLN ophthalmic solution Place 1 drop into the left eye at bedtime.  . vitamin B-12 (CYANOCOBALAMIN) 1000 MCG tablet Take 1,000 mcg by mouth daily.    Allergies: No Known Allergies  Family History  Problem Relation Age of Onset  . Kidney cancer Son   . Bladder Cancer Neg Hx     Social History:  reports that she quit smoking about 6 years ago. She has never used smokeless tobacco. She reports that she does not drink alcohol or use drugs.  ROS: A complete review of systems was performed.  All systems are negative except for pertinent findings as noted.  Physical Exam:  Vital signs in last 24 hours: Temp:  [98 F (36.7 C)-98.6 F (37 C)] 98 F (36.7 C) (09/10 1945) Pulse Rate:  [88-101] 101 (09/10 1945) Resp:  [18-20] 20 (09/10 1945) BP: (118-137)/(49-54) 137/51 (09/10 1945) SpO2:  [97 %-100 %] 97 % (09/10 1945) Constitutional:  Alert and oriented, No acute distress  Laboratory Data:  Recent Labs    03/17/18 1154  03/18/18 0405  WBC 10.0 6.6  HGB 12.4 10.7*  HCT 39.6 32.7*   Recent Labs    03/17/18 1021 03/17/18 1154 03/18/18 0405  NA  --  130* 136  K  --  5.3* 4.6  CL  --  101 109  CO2  --  12* 19*  GLUCOSE  --  286* 203*  BUN  --  67* 58*  CREATININE 5.20* 5.20* 4.01*  CALCIUM  --  9.8 9.3    Radiologic Imaging: Ct Renal Stone Study  Result Date: 03/17/2018 CLINICAL DATA:  Low back pain and nausea for the past 2 months. EXAM: CT ABDOMEN AND PELVIS WITHOUT CONTRAST TECHNIQUE: Multidetector CT imaging of the abdomen and pelvis was performed following the standard protocol without IV contrast. COMPARISON:  Chest x-ray dated  January 29, 2018. CT abdomen dated January 30, 2017. FINDINGS: Lower chest: No acute abnormality. Unchanged mild subpleural reticulation at both lung bases. Hepatobiliary: Unchanged cirrhosis. No focal liver abnormality. Status post cholecystectomy. No biliary dilatation. Pancreas: Unremarkable. No pancreatic ductal dilatation or surrounding inflammatory changes. Spleen: Stable mild splenomegaly.  No focal abnormality. Adrenals/Urinary Tract: The adrenal glands and right kidney are unremarkable. Grossly unchanged 18 mm exophytic lesion arising from the upper pole of the left kidney. No renal or ureteral calculi. No hydronephrosis. The bladder is unremarkable. Stomach/Bowel: Stomach is within normal limits. Appendix appears normal. No evidence of bowel wall thickening, distention, or inflammatory changes. Mild colonic diverticulosis. Vascular/Lymphatic: Aortic atherosclerosis. Unchanged recanalized umbilical vein. No enlarged abdominal or pelvic lymph nodes. Reproductive: Uterus and bilateral adnexa are unremarkable. Other: No free fluid or pneumoperitoneum. Musculoskeletal: New subacute T12 compression fracture with 50% height loss and 5 mm retropulsion. Unchanged severe degenerative disc disease at L2-L3. IMPRESSION: 1. New subacute T12 compression fracture with 50% height loss and 5 mm  retropulsion. 2. Grossly unchanged exophytic left renal lesion, concerning for renal neoplasm. 3. Unchanged cirrhosis and sequelae of portal hypertension mild splenomegaly. 4.  Aortic atherosclerosis (ICD10-I70.0). Electronically Signed   By: Titus Dubin M.D.   On: 03/17/2018 12:38    Impression/Recommendation:  77 year old female with a 1.8 cm left renal mass which has been stable on serial imaging over the past 18 months.  She and her family have previously been thoroughly counseled by Dr. Erlene Quan that this most likely represents a small renal cell carcinoma.    Based on mass size, stability, age and medical comorbidities continued surveillance is a preferred management option and would recommend a follow-up renal ultrasound in 9 months.    03/18/2018, 8:20 PM  John Giovanni, MD

## 2018-03-18 NOTE — Progress Notes (Signed)
Saw patient and discussed with Dr Posey Pronto and patient. Will discus kyphoplasty further with patient tomorrow with bone model and brochure.  Improved renal function would lessen surgical risk and may perform kyphoplasty later in week.

## 2018-03-18 NOTE — Consult Note (Signed)
ORTHOPAEDIC CONSULTATION  REQUESTING PHYSICIAN: Demetrios Loll, MD  Chief Complaint:   Low Back pain  History of Present Illness: Emily Faulkner is a 77 y.o. female who is admitted due to acute on chronic renal failure with renal mass. She was also found to have a compression fracture at T12.  This was first discovered at [facility] on radiographs from 03/12/18. She notes having back pain for ~2 months after she stepped wrong, which caused intense midline back pain. Pain in back currently is 8/10 in severity. It is located in the midline of her back at the thoracolumbar junction area with radiation from this area to her paraspinal musculature. Pain is worse with bending and twisting. Pain improved partially with medications and rest. She has no radicular pain, numbness/tingling, weakness, or changes to bowel/bladder function.    Past Medical History:  Diagnosis Date  . Cancer [facility])    breast cancer right mastectomy  . Chronic kidney disease   . Diabetes mellitus without complication ([place])   . Glaucoma   . Hyperlipidemia   . Hypertension   . Neuropathy   . Osteoarthritis   . Rheumatoid arthritis ([place])   . Telangiectasias    Past Surgical History:  Procedure Laterality Date  . BREAST SURGERY     right mastectomy  . CHOLECYSTECTOMY    . COLONOSCOPY    . COLONOSCOPY WITH PROPOFOL N/A 08/14/2016   Procedure: COLONOSCOPY WITH PROPOFOL;  Surgeon: Lollie Sails, MD;  Location: [facility] ENDOSCOPY;  Service: Endoscopy;  Laterality: N/A;  . COLONOSCOPY WITH PROPOFOL N/A 02/22/2018   Procedure: COLONOSCOPY WITH PROPOFOL;  Surgeon: Jonathon Bellows, MD;  Location: [facility] ENDOSCOPY;  Service: Gastroenterology;  Laterality: N/A;  . ESOPHAGOGASTRODUODENOSCOPY (EGD) WITH PROPOFOL N/A 08/14/2016   Procedure: ESOPHAGOGASTRODUODENOSCOPY (EGD) WITH PROPOFOL;  Surgeon: Lollie Sails, MD;  Location: [facility] ENDOSCOPY;  Service: Endoscopy;   Laterality: N/A;  . ESOPHAGOGASTRODUODENOSCOPY (EGD) WITH PROPOFOL N/A 01/30/2018   Procedure: ESOPHAGOGASTRODUODENOSCOPY (EGD) WITH PROPOFOL;  Surgeon: Toledo, Benay Pike, MD;  Location: [facility] ENDOSCOPY;  Service: Gastroenterology;  Laterality: N/A;  . ESOPHAGOGASTRODUODENOSCOPY (EGD) WITH PROPOFOL N/A 02/22/2018   Procedure: ESOPHAGOGASTRODUODENOSCOPY (EGD) WITH PROPOFOL;  Surgeon: Jonathon Bellows, MD;  Location: [facility] ENDOSCOPY;  Service: Gastroenterology;  Laterality: N/A;  . EXCISIONAL HEMORRHOIDECTOMY     Social History   Socioeconomic History  . Marital status: Married    Spouse name: Not on file  . Number of children: Not on file  . Years of education: Not on file  . Highest education level: Not on file  Occupational History  . Not on file  Social Needs  . Financial resource strain: Not on file  . Food insecurity:    Worry: Not on file    Inability: Not on file  . Transportation needs:    Medical: Not on file    Non-medical: Not on file  Tobacco Use  . Smoking status: Former Smoker    Last attempt to quit: 07/10/2011    Years since quitting: 6.6  . Smokeless tobacco: Never Used  Substance and Sexual Activity  . Alcohol use: No  . Drug use: No  . Sexual activity: Not on file  Lifestyle  . Physical activity:    Days per week: Not on file    Minutes per session: Not on file  . Stress: Not on file  Relationships  . Social connections:    Talks on phone: Not on file    Gets together: Not on file    Attends religious service:  Not on file    Active member of club or organization: Not on file    Attends meetings of clubs or organizations: Not on file    Relationship status: Not on file  Other Topics Concern  . Not on file  Social History Narrative  . Not on file   Family History  Problem Relation Age of Onset  . Kidney cancer Son   . Bladder Cancer Neg Hx    No Known Allergies Prior to Admission medications   Medication Sig Start Date End Date Taking? Authorizing  Provider  aspirin 81 MG EC tablet Take 81 mg by mouth daily.    Yes [provider]  clindamycin (CLEOCIN T) 1 % external solution Apply 1 application topically 2 (two) times daily. 12/31/17  Yes [provider]  clobetasol (TEMOVATE) 0.05 % external solution Apply 1 application topically every evening. (apply to scalp) 01/25/18  Yes [provider]  Ergocalciferol (VITAMIN D2) 2000 units TABS Take 1 tablet by mouth daily.   Yes [provider]  feeding supplement, ENSURE ENLIVE, (ENSURE ENLIVE) LIQD Take 237 mLs by mouth 2 (two) times daily between meals. 02/26/18  Yes Fritzi Mandes, MD  ferrous fumarate (HEMOCYTE - 106 MG FE) 325 (106 Fe) MG TABS tablet Take 1 tablet (106 mg of iron total) by mouth 2 (two) times daily. 01/31/18  Yes Sudini, Alveta Heimlich, MD  furosemide (LASIX) 40 MG tablet Take 1 tablet (40 mg total) by mouth 2 (two) times daily. 01/31/18  Yes Sudini, Alveta Heimlich, MD  glipiZIDE (GLUCOTROL) 10 MG tablet Take 10 mg by mouth daily.    Yes [provider]  hydrOXYzine (ATARAX/VISTARIL) 10 MG tablet Take 10 mg by mouth at bedtime. 11/11/17  Yes [provider]  Insulin Glargine (LANTUS SOLOSTAR) 100 UNIT/ML Solostar Pen Inject 36 Units into the skin daily. (may take up to 50u based upon blood glucose reading)   Yes [provider]  ketoconazole (NIZORAL) 2 % shampoo Apply 1 application topically daily. (apply to scalp and wait at least 30 minutes before rinsing) 01/25/18  Yes [provider]  lisinopril (PRINIVIL,ZESTRIL) 2.5 MG tablet Take 2.5 mg by mouth daily.    Yes [provider]  loratadine (CLARITIN) 10 MG tablet Take 10 mg by mouth daily.   Yes [provider]  magnesium oxide (MAG-OX) 400 MG tablet Take 800 mg by mouth daily. 10/02/17  Yes [provider]  methocarbamol (ROBAXIN) 500 MG tablet Take 500-1,500 mg by mouth See admin instructions. On day 1-3 take 1500 mg by mouth three times daily, on days  4-6 take 1000 mg by mouth three times daily, then take 1000 mg by mouth three times daily as needed. 03/12/18  Yes [provider]  niacin (NIASPAN) 500 MG CR tablet Take 500 mg by mouth every evening. 12/25/17  Yes [provider]  Omega-3 Fatty Acids (FISH OIL) 1000 MG CAPS Take 1 capsule by mouth.    Yes [provider]  pantoprazole (PROTONIX) 40 MG tablet Take 1 tablet (40 mg total) by mouth daily. 02/26/18  Yes Fritzi Mandes, MD  potassium chloride SA (K-DUR,KLOR-CON) 20 MEQ tablet Take 20 mEq by mouth 2 (two) times daily. 03/11/18  Yes [provider]  simvastatin (ZOCOR) 20 MG tablet Take 20 mg by mouth at bedtime. 12/23/17  Yes [provider]  sitaGLIPtin (JANUVIA) 50 MG tablet Take 50 mg by mouth daily.   Yes [provider]  traMADol (ULTRAM) 50 MG tablet Take 1  tablet (50 mg total) by mouth every 8 (eight) hours as needed for severe pain. 02/26/18  Yes Fritzi Mandes, MD  TRAVATAN Z 0.004 % SOLN ophthalmic solution Place 1 drop into the left eye at bedtime. 12/05/17  Yes [provider]  vitamin B-12 (CYANOCOBALAMIN) 1000 MCG tablet Take 1,000 mcg by mouth daily.   Yes [provider]   Recent Labs    03/17/18 1154 03/18/18 0405  WBC 10.0 6.6  HGB 12.4 10.7*  HCT 39.6 32.7*  PLT 193 170  K 5.3* 4.6  CL 101 109  CO2 12* 19*  BUN 67* 58*  CREATININE 5.20* 4.01*  GLUCOSE 286* 203*  CALCIUM 9.8 9.3   Ct Renal Stone Study  Result Date: 03/17/2018 CLINICAL DATA:  Low back pain and nausea for the past 2 months. EXAM: CT ABDOMEN AND PELVIS WITHOUT CONTRAST TECHNIQUE: Multidetector CT imaging of the abdomen and pelvis was performed following the standard protocol without IV contrast. COMPARISON:  Chest x-ray dated January 29, 2018. CT abdomen dated January 30, 2017. FINDINGS: Lower chest: No acute abnormality. Unchanged mild subpleural reticulation at both lung bases. Hepatobiliary: Unchanged cirrhosis. No focal liver abnormality.  Status post cholecystectomy. No biliary dilatation. Pancreas: Unremarkable. No pancreatic ductal dilatation or surrounding inflammatory changes. Spleen: Stable mild splenomegaly.  No focal abnormality. Adrenals/Urinary Tract: The adrenal glands and right kidney are unremarkable. Grossly unchanged 18 mm exophytic lesion arising from the upper pole of the left kidney. No renal or ureteral calculi. No hydronephrosis. The bladder is unremarkable. Stomach/Bowel: Stomach is within normal limits. Appendix appears normal. No evidence of bowel wall thickening, distention, or inflammatory changes. Mild colonic diverticulosis. Vascular/Lymphatic: Aortic atherosclerosis. Unchanged recanalized umbilical vein. No enlarged abdominal or pelvic lymph nodes. Reproductive: Uterus and bilateral adnexa are unremarkable. Other: No free fluid or pneumoperitoneum. Musculoskeletal: New subacute T12 compression fracture with 50% height loss and 5 mm retropulsion. Unchanged severe degenerative disc disease at L2-L3. IMPRESSION: 1. New subacute T12 compression fracture with 50% height loss and 5 mm retropulsion. 2. Grossly unchanged exophytic left renal lesion, concerning for renal neoplasm. 3. Unchanged cirrhosis and sequelae of portal hypertension mild splenomegaly. 4.  Aortic atherosclerosis (ICD10-I70.0). Electronically Signed   By: Titus Dubin M.D.   On: 03/17/2018 12:38     Positive ROS: All other systems have been reviewed and were otherwise negative with the exception of those mentioned in the HPI and as above.  Physical Exam: BP (!) 118/49 (BP Location: Left Arm)   Pulse 88   Temp 98.2 F (36.8 C) (Oral)   Resp 18   Ht 5' 4"  (1.626 m)   Wt 74.9 kg   SpO2 98%   BMI 28.34 kg/m  General:  Alert, no acute distress Psychiatric:  Patient is competent for consent with normal mood and affect   Cardiovascular:  No pedal edema, regular rate and rhythm Respiratory:  No wheezing, non-labored breathing GI:  Abdomen is soft  and non-tender Skin:  No lesions in the area of chief complaint, no erythema Neurologic:  Sensation intact distally, CN grossly intact Lymphatic:  No axillary or cervical lymphadenopathy  Orthopedic Exam:  Bilateral lower extremities: - 5/5 DF/PF/EHL, +KF/KE, HF - SILT L2-S1 distributions - Feet wwp - No pathologic reflexes - normal Babinksi sign - Negative bilateral straight leg raise (back pain only) - TTP most over midline of back about thoracolumbar junction     Imaging:  As above: subacute appearing T12 compression fracture  Assessment/Plan: SARIAH HENKIN is a 77  y.o. female with a subacute appearing T12 compression fracture; admitted for renal failure and currently has Cr of 4. 1. Patient is still having significant pain about the T12 level after likely ~2 months from injury without other neurologic symptoms. We discussed various treatment options including further non-operative management vs. Surgical intervention in the form of kyphoplasty. Patient is interested in considering surgical options given the length of time that she has been in pain.  2. I have discussed this patient with Dr. Hessie Knows who will plan to see the patient to further discuss kyphoplasty. Patient in agreement with plan.     Leim Fabry

## 2018-03-18 NOTE — Progress Notes (Signed)
[facility] NAME: Emily Faulkner    MR#:  [id number]  DATE OF BIRTH:  1941/05/31  SUBJECTIVE:  CHIEF COMPLAINT:   Chief Complaint  Patient presents with  . Abnormal Lab   Better back pain.  Generalized weakness. REVIEW OF SYSTEMS:  Review of Systems  Constitutional: Negative for chills, fever and malaise/fatigue.  HENT: Negative for sore throat.   Eyes: Negative for blurred vision and double vision.  Respiratory: Negative for cough, hemoptysis, shortness of breath, wheezing and stridor.   Cardiovascular: Negative for chest pain, palpitations, orthopnea and leg swelling.  Gastrointestinal: Negative for abdominal pain, blood in stool, diarrhea, melena, nausea and vomiting.  Genitourinary: Negative for dysuria, flank pain and hematuria.  Musculoskeletal: Positive for back pain. Negative for joint pain.  Skin: Negative for rash.  Neurological: Negative for dizziness, sensory change, focal weakness, seizures, loss of consciousness, weakness and headaches.  Endo/Heme/Allergies: Negative for polydipsia.  Psychiatric/Behavioral: Negative for depression. The patient is not nervous/anxious.     DRUG ALLERGIES:  No Known Allergies VITALS:  Blood pressure (!) 121/54, pulse 90, temperature 98.6 F (37 C), temperature source Oral, resp. rate 20, height 5' 4"  (1.626 m), weight 74.9 kg, SpO2 100 %. PHYSICAL EXAMINATION:  Physical Exam  Constitutional: She is oriented to person, place, and time. She appears well-developed.  HENT:  Head: Normocephalic.  Mouth/Throat: Oropharynx is clear and moist.  Eyes: Pupils are equal, round, and reactive to light. Conjunctivae and EOM are normal. No scleral icterus.  Neck: Normal range of motion. Neck supple. No JVD present. No tracheal deviation present.  Cardiovascular: Normal rate, regular rhythm and normal heart sounds. Exam reveals no gallop.  No murmur heard. Pulmonary/Chest: Effort normal and  breath sounds normal. No respiratory distress. She has no wheezes. She has no rales.  Abdominal: Soft. Bowel sounds are normal. She exhibits no distension. There is no tenderness. There is no rebound.  Musculoskeletal: Normal range of motion. She exhibits no edema or tenderness.  Neurological: She is alert and oriented to person, place, and time. No cranial nerve deficit.  Skin: No rash noted. No erythema.  Psychiatric: She has a normal mood and affect.   LABORATORY PANEL:  Female CBC Recent Labs  Lab 03/18/18 0405  WBC 6.6  HGB 10.7*  HCT 32.7*  PLT 170   ------------------------------------------------------------------------------------------------------------------ Chemistries  Recent Labs  Lab 03/17/18 1154 03/18/18 0405  NA 130* 136  K 5.3* 4.6  CL 101 109  CO2 12* 19*  GLUCOSE 286* 203*  BUN 67* 58*  CREATININE 5.20* 4.01*  CALCIUM 9.8 9.3  AST 44*  --   ALT 23  --   ALKPHOS 92  --   BILITOT 1.4*  --    RADIOLOGY:  No results found. ASSESSMENT AND PLAN:   *Acute on chronic renal failure Continue IV fluid, follow-up renal ultrasound and BMP per Dr. Holley Raring. Hold antihypertensive and nephrotoxic medications.  Renal mass, follow-up with urologist for further recommendation.   *Compression fracture on T12 Pain control. Orthopedic consult for further management.  *Generalized weakness, fatigue, loss of appetite Renal mass is suspicious for cancer, urologist consult. As per patient, in the clinic urologist with monitoring the size of the mass but never had biopsy done for the diagnosis.  *Diabetes Hold oral diabetic medications, keep on sliding scale coverage with insulin.  Discussed with Dr. Holley Raring. All the records are reviewed and case discussed with Care Management/Social Worker. Management plans discussed with the  patient, daughter-in-law and they are in agreement.  CODE STATUS: DNR  TOTAL TIME TAKING CARE OF THIS PATIENT: 37 minutes.   More than  50% of the time was spent in counseling/coordination of care: YES  POSSIBLE D/C IN 3 DAYS, DEPENDING ON CLINICAL CONDITION.   Demetrios Loll M.D on 03/18/2018 at 1:52 PM  Between 7am to 6pm - Pager - [phone]  After 6pm go to [email or web address] - [facility]

## 2018-03-18 NOTE — Consult Note (Signed)
[facility] CONSULT NOTE    Date: 03/18/2018                  Patient Name:  Emily Faulkner  MRN: [id number]  DOB: 04-May-1941  Age / Sex: 77 y.o., female         PCP: Sofie Hartigan, MD                 Service Requesting Consult: Hospitalist                 Reason for Consult: Acute renal failure/CKD stage III            History of Present Illness: Patient is a 77 y.o. female with a PMHx of right breast cancer status post mastectomy, chronic kidney disease stage III, diabetes mellitus type 2, glaucoma, hyperlipidemia, hypertension, peripheral neuropathy, rheumatoid arthritis, left renal mass, who was admitted to [facility] on 03/17/2018 for evaluation of acute renal failure.  Patient was to have a CT scan of the abdomen and pelvis as an outpatient to evaluate left renal mass.  However on pre-imaging labs creatinine was on be 5.2.  Therefore she was advised to come to the emergency department for additional evaluation.  Patient reports that she's had rather poor by mouth intake secondary to back pain.  She has not been drinking as much is normal either.  She denies NSAID ingestion.  She was noted to be on lisinopril as well as Lasix at home.patient has been started on IV fluid hydration.  Urine output thus far today has been 400 cc.   Medications: Outpatient medications: Medications Prior to Admission  Medication Sig Dispense Refill Last Dose  . aspirin 81 MG EC tablet Take 81 mg by mouth daily.    03/16/2018 at Unknown time  . clindamycin (CLEOCIN T) 1 % external solution Apply 1 application topically 2 (two) times daily.  2 Past Week at Unknown time  . clobetasol (TEMOVATE) 0.05 % external solution Apply 1 application topically every evening. (apply to scalp)   Past Week at Unknown time  . Ergocalciferol (VITAMIN D2) 2000 units TABS Take 1 tablet by mouth daily.   03/16/2018 at Unknown time  . feeding supplement, ENSURE ENLIVE, (ENSURE ENLIVE) LIQD Take 237 mLs by mouth  2 (two) times daily between meals. 237 mL 12 Taking  . ferrous fumarate (HEMOCYTE - 106 MG FE) 325 (106 Fe) MG TABS tablet Take 1 tablet (106 mg of iron total) by mouth 2 (two) times daily. 60 each 0 03/16/2018 at Unknown time  . furosemide (LASIX) 40 MG tablet Take 1 tablet (40 mg total) by mouth 2 (two) times daily. 60 tablet 0 03/16/2018 at Unknown time  . glipiZIDE (GLUCOTROL) 10 MG tablet Take 10 mg by mouth daily.    03/16/2018 at Unknown time  . hydrOXYzine (ATARAX/VISTARIL) 10 MG tablet Take 10 mg by mouth at bedtime.  1 03/16/2018 at Unknown time  . Insulin Glargine (LANTUS SOLOSTAR) 100 UNIT/ML Solostar Pen Inject 36 Units into the skin daily. (may take up to 50u based upon blood glucose reading)   03/16/2018 at Unknown time  . ketoconazole (NIZORAL) 2 % shampoo Apply 1 application topically daily. (apply to scalp and wait at least 30 minutes before rinsing)   Past Week at Unknown time  . lisinopril (PRINIVIL,ZESTRIL) 2.5 MG tablet Take 2.5 mg by mouth daily.    03/16/2018 at Unknown time  . loratadine (CLARITIN) 10 MG tablet Take 10 mg by mouth  daily.   03/16/2018 at Unknown time  . magnesium oxide (MAG-OX) 400 MG tablet Take 800 mg by mouth daily.   03/16/2018 at Unknown time  . methocarbamol (ROBAXIN) 500 MG tablet Take 500-1,500 mg by mouth See admin instructions. On day 1-3 take 1500 mg by mouth three times daily, on days 4-6 take 1000 mg by mouth three times daily, then take 1000 mg by mouth three times daily as needed.  0 03/16/2018 at Unknown time  . niacin (NIASPAN) 500 MG CR tablet Take 500 mg by mouth every evening.  2 03/16/2018 at Unknown time  . Omega-3 Fatty Acids (FISH OIL) 1000 MG CAPS Take 1 capsule by mouth.    03/16/2018 at Unknown time  . pantoprazole (PROTONIX) 40 MG tablet Take 1 tablet (40 mg total) by mouth daily. 30 tablet 0 03/16/2018 at Unknown time  . potassium chloride SA (K-DUR,KLOR-CON) 20 MEQ tablet Take 20 mEq by mouth 2 (two) times daily.   03/16/2018 at Unknown time  . simvastatin  (ZOCOR) 20 MG tablet Take 20 mg by mouth at bedtime.  6 03/16/2018 at Unknown time  . sitaGLIPtin (JANUVIA) 50 MG tablet Take 50 mg by mouth daily.   03/16/2018 at Unknown time  . traMADol (ULTRAM) 50 MG tablet Take 1 tablet (50 mg total) by mouth every 8 (eight) hours as needed for severe pain. 15 tablet 0 PRN at PRN  . TRAVATAN Z 0.004 % SOLN ophthalmic solution Place 1 drop into the left eye at bedtime.   03/16/2018 at Unknown time  . vitamin B-12 (CYANOCOBALAMIN) 1000 MCG tablet Take 1,000 mcg by mouth daily.   03/16/2018 at Unknown time    Current medications: Current Facility-Administered Medications  Medication Dose Route Frequency Provider Last Rate Last Dose  . 0.9 %  sodium chloride infusion   Intravenous Continuous Vaughan Basta, MD 75 mL/hr at 03/18/18 1253    . aspirin EC tablet 81 mg  81 mg Oral Daily Vaughan Basta, MD   81 mg at 03/18/18 1055  . bisacodyl (DULCOLAX) EC tablet 5 mg  5 mg Oral Daily PRN Demetrios Loll, MD      . cholecalciferol (VITAMIN D) tablet 1,000 Units  1,000 Units Oral Daily Vaughan Basta, MD   1,000 Units at 03/18/18 1054  . docusate sodium (COLACE) capsule 100 mg  100 mg Oral BID PRN Vaughan Basta, MD      . feeding supplement (ENSURE ENLIVE) (ENSURE ENLIVE) liquid 237 mL  237 mL Oral BID BM Vaughan Basta, MD   237 mL at 03/18/18 1300  . ferrous fumarate (HEMOCYTE - 106 mg FE) tablet 106 mg of iron  1 tablet Oral BID Vaughan Basta, MD   106 mg of iron at 03/18/18 1054  . heparin injection 5,000 Units  5,000 Units Subcutaneous Q8H Vaughan Basta, MD   5,000 Units at 03/18/18 0533  . insulin aspart (novoLOG) injection 0-9 Units  0-9 Units Subcutaneous TID WC Vaughan Basta, MD   2 Units at 03/18/18 0837  . loratadine (CLARITIN) tablet 10 mg  10 mg Oral Daily Vaughan Basta, MD   10 mg at 03/18/18 1055  . morphine 2 MG/ML injection 2 mg  2 mg Intravenous Q4H PRN Vaughan Basta, MD   2 mg  at 03/18/18 0220  . ondansetron (ZOFRAN) injection 4 mg  4 mg Intravenous Q6H PRN Lance Coon, MD   4 mg at 03/18/18 0220  . oxyCODONE-acetaminophen (PERCOCET/ROXICET) 5-325 MG per tablet 1 tablet  1 tablet Oral Q4H  PRN Demetrios Loll, MD      . pantoprazole (PROTONIX) EC tablet 40 mg  40 mg Oral Daily Vaughan Basta, MD   40 mg at 03/18/18 1055  . simvastatin (ZOCOR) tablet 20 mg  20 mg Oral QHS Vaughan Basta, MD   20 mg at 03/17/18 2219  . vitamin B-12 (CYANOCOBALAMIN) tablet 1,000 mcg  1,000 mcg Oral Daily Vaughan Basta, MD   1,000 mcg at 03/18/18 1054      Allergies: No Known Allergies    Past Medical History: Past Medical History:  Diagnosis Date  . Cancer [facility])    breast cancer right mastectomy  . Chronic kidney disease   . Diabetes mellitus without complication ([place])   . Glaucoma   . Hyperlipidemia   . Hypertension   . Neuropathy   . Osteoarthritis   . Rheumatoid arthritis ([place])   . Telangiectasias      Past Surgical History: Past Surgical History:  Procedure Laterality Date  . BREAST SURGERY     right mastectomy  . CHOLECYSTECTOMY    . COLONOSCOPY    . COLONOSCOPY WITH PROPOFOL N/A 08/14/2016   Procedure: COLONOSCOPY WITH PROPOFOL;  Surgeon: Lollie Sails, MD;  Location: [facility] ENDOSCOPY;  Service: Endoscopy;  Laterality: N/A;  . COLONOSCOPY WITH PROPOFOL N/A 02/22/2018   Procedure: COLONOSCOPY WITH PROPOFOL;  Surgeon: Jonathon Bellows, MD;  Location[facility] ENDOSCOPY;  Service: Gastroenterology;  Laterality: N/A;  . ESOPHAGOGASTRODUODENOSCOPY (EGD) WITH PROPOFOL N/A 08/14/2016   Procedure: ESOPHAGOGASTRODUODENOSCOPY (EGD) WITH PROPOFOL;  Surgeon: Lollie Sails, MD;  Location: [facility] ENDOSCOPY;  Service: Endoscopy;  Laterality: N/A;  . ESOPHAGOGASTRODUODENOSCOPY (EGD) WITH PROPOFOL N/A 01/30/2018   Procedure: ESOPHAGOGASTRODUODENOSCOPY (EGD) WITH PROPOFOL;  Surgeon: Tol[place], Benay Pike, MD;  Location: [facility] ENDOSCOPY;  Service: Gastroenterology;   Laterality: N/A;  . ESOPHAGOGASTRODUODENOSCOPY (EGD) WITH PROPOFOL N/A 02/22/2018   Procedure: ESOPHAGOGASTRODUODENOSCOPY (EGD) WITH PROPOFOL;  Surgeon: Jonathon Bellows, MD;  Location: [facility] ENDOSCOPY;  Service: Gastroenterology;  Laterality: N/A;  . EXCISIONAL HEMORRHOIDECTOMY       Family History: Family History  Problem Relation Age of Onset  . Kidney cancer Son   . Bladder Cancer Neg Hx      Social History: Social History   Socioeconomic History  . Marital status: Married    Spouse name: Not on file  . Number of children: Not on file  . Years of education: Not on file  . Highest education level: Not on file  Occupational History  . Not on file  Social Needs  . Financial resource strain: Not on file  . Food insecurity:    Worry: Not on file    Inability: Not on file  . Transportation needs:    Medical: Not on file    Non-medical: Not on file  Tobacco Use  . Smoking status: Former Smoker    Last attempt to quit: 07/10/2011    Years since quitting: 6.6  . Smokeless tobacco: Never Used  Substance and Sexual Activity  . Alcohol use: No  . Drug use: No  . Sexual activity: Not on file  Lifestyle  . Physical activity:    Days per week: Not on file    Minutes per session: Not on file  . Stress: Not on file  Relationships  . Social connections:    Talks on phone: Not on file    Gets together: Not on file    Attends religious service: Not on file    Active member of club or organization: Not on file  Attends meetings of clubs or organizations: Not on file    Relationship status: Not on file  . Intimate partner violence:    Fear of current or ex partner: Not on file    Emotionally abused: Not on file    Physically abused: Not on file    Forced sexual activity: Not on file  Other Topics Concern  . Not on file  Social History Narrative  . Not on file     Review of Systems: Review of Systems  Constitutional: Positive for malaise/fatigue. Negative for chills and  fever.  HENT: Negative for hearing loss, sinus pain and tinnitus.   Eyes: Negative for blurred vision and double vision.  Respiratory: Negative for cough, hemoptysis and sputum production.   Cardiovascular: Negative for chest pain, palpitations and orthopnea.  Gastrointestinal: Negative for heartburn, nausea and vomiting.  Genitourinary: Negative for dysuria and hematuria.  Musculoskeletal: Positive for back pain.  Skin: Negative for itching and rash.  Neurological: Negative for dizziness and focal weakness.  Endo/Heme/Allergies: Negative for polydipsia. Does not bruise/bleed easily.  Psychiatric/Behavioral: Negative for depression. The patient is not nervous/anxious.      Vital Signs: Blood pressure (!) 121/54, pulse 90, temperature 98.6 F (37 C), temperature source Oral, resp. rate 20, height 5' 4"  (1.626 m), weight 74.9 kg, SpO2 100 %.  Weight trends: Filed Weights   03/17/18 1123 03/17/18 1743  Weight: 78.5 kg 74.9 kg    Physical Exam: General: NAD, resting in bed  Head: Normocephalic, atraumatic.  Eyes: Anicteric, EOMI  Nose: Mucous membranes moist, not inflammed, nonerythematous.  Throat: Oropharynx nonerythematous, no exudate appreciated.   Neck: Supple, trachea midline.  Lungs:  Normal respiratory effort. Clear to auscultation BL without crackles or wheezes.  Heart: RRR. S1 and S2 normal without gallop, murmur, or rubs.  Abdomen:  BS normoactive. Soft, Nondistended, non-tender.  No masses or organomegaly.  Extremities: No pretibial edema.  Neurologic: A&O X3, Motor strength is 5/5 in the all 4 extremities  Skin: No visible rashes, scars.    Lab results: Basic Metabolic Panel: Recent Labs  Lab 03/17/18 1021 03/17/18 1154 03/18/18 0405  NA  --  130* 136  K  --  5.3* 4.6  CL  --  101 109  CO2  --  12* 19*  GLUCOSE  --  286* 203*  BUN  --  67* 58*  CREATININE 5.20* 5.20* 4.01*  CALCIUM  --  9.8 9.3    Liver Function Tests: Recent Labs  Lab  03/17/18 1154  AST 44*  ALT 23  ALKPHOS 92  BILITOT 1.4*  PROT 8.2*  ALBUMIN 4.3   No results for input(s): LIPASE, AMYLASE in the last 168 hours. No results for input(s): AMMONIA in the last 168 hours.  CBC: Recent Labs  Lab 03/17/18 1154 03/18/18 0405  WBC 10.0 6.6  NEUTROABS 7.9*  --   HGB 12.4 10.7*  HCT 39.6 32.7*  MCV 105.9* 102.0*  PLT 193 170    Cardiac Enzymes: No results for input(s): CKTOTAL, CKMB, CKMBINDEX, TROPONINI in the last 168 hours.  BNP: Invalid input(s): POCBNP  CBG: Recent Labs  Lab 03/17/18 1852 03/17/18 2123 03/18/18 0744 03/18/18 1134  GLUCAP 129* 219* 169* 148*    Microbiology: Results for orders placed or performed during the hospital encounter of 02/20/18  CULTURE, BLOOD (ROUTINE X 2) w Reflex to ID Panel     Status: None   Collection Time: 02/23/18  7:05 PM  Result Value Ref Range Status  Specimen Description BLOOD LEFT WRIST  Final   Special Requests   Final    BOTTLES DRAWN AEROBIC AND ANAEROBIC Blood Culture results may not be optimal due to an inadequate volume of blood received in culture bottles   Culture   Final    NO GROWTH 5 DAYS Performed at [facility], [address] [place], [place] [address]    Report Status 02/28/2018 FINAL  Final  CULTURE, BLOOD (ROUTINE X 2) w Reflex to ID Panel     Status: None   Collection Time: 02/23/18  7:05 PM  Result Value Ref Range Status   Specimen Description BLOOD LEFT HAND  Final   Special Requests   Final    BOTTLES DRAWN AEROBIC AND ANAEROBIC Blood Culture adequate volume   Culture   Final    NO GROWTH 5 DAYS Performed at [facility], [address], [place], [place] [address]    Report Status 02/28/2018 FINAL  Final    Coagulation Studies: No results for input(s): LABPROT, INR in the last 72 hours.  Urinalysis: Recent Labs    03/17/18 1850  COLORURINE YELLOW*  LABSPEC 1.014  PHURINE 5.0  GLUCOSEU NEGATIVE  HGBUR NEGATIVE  BILIRUBINUR  NEGATIVE  KETONESUR NEGATIVE  PROTEINUR NEGATIVE  NITRITE NEGATIVE  LEUKOCYTESUR NEGATIVE      Imaging: Ct Renal Stone Study  Result Date: 03/17/2018 CLINICAL DATA:  Low back pain and nausea for the past 2 months. EXAM: CT ABDOMEN AND PELVIS WITHOUT CONTRAST TECHNIQUE: Multidetector CT imaging of the abdomen and pelvis was performed following the standard protocol without IV contrast. COMPARISON:  Chest x-ray dated January 29, 2018. CT abdomen dated January 30, 2017. FINDINGS: Lower chest: No acute abnormality. Unchanged mild subpleural reticulation at both lung bases. Hepatobiliary: Unchanged cirrhosis. No focal liver abnormality. Status post cholecystectomy. No biliary dilatation. Pancreas: Unremarkable. No pancreatic ductal dilatation or surrounding inflammatory changes. Spleen: Stable mild splenomegaly.  No focal abnormality. Adrenals/Urinary Tract: The adrenal glands and right kidney are unremarkable. Grossly unchanged 18 mm exophytic lesion arising from the upper pole of the left kidney. No renal or ureteral calculi. No hydronephrosis. The bladder is unremarkable. Stomach/Bowel: Stomach is within normal limits. Appendix appears normal. No evidence of bowel wall thickening, distention, or inflammatory changes. Mild colonic diverticulosis. Vascular/Lymphatic: Aortic atherosclerosis. Unchanged recanalized umbilical vein. No enlarged abdominal or pelvic lymph nodes. Reproductive: Uterus and bilateral adnexa are unremarkable. Other: No free fluid or pneumoperitoneum. Musculoskeletal: New subacute T12 compression fracture with 50% height loss and 5 mm retropulsion. Unchanged severe degenerative disc disease at L2-L3. IMPRESSION: 1. New subacute T12 compression fracture with 50% height loss and 5 mm retropulsion. 2. Grossly unchanged exophytic left renal lesion, concerning for renal neoplasm. 3. Unchanged cirrhosis and sequelae of portal hypertension mild splenomegaly. 4.  Aortic atherosclerosis (ICD10-I70.0).  Electronically Signed   By: Titus Dubin M.D.   On: 03/17/2018 12:38      Assessment & Plan: Pt is a 78 y.o. female with a PMHx of right breast cancer status post mastectomy, chronic kidney disease stage III, diabetes mellitus type 2, glaucoma, hyperlipidemia, hypertension, peripheral neuropathy, rheumatoid arthritis, left renal mass, who was admitted to [facility] on 03/17/2018 for evaluation of acute renal failure.   1.  Acute renal failure/chronic kidney disease stage III.  Acute renal failure likely secondary to dehydration and poor by mouth intake as well as taking Lasix and lisinopril in a dehydrated state. 2.  Left renal mass suspicious for renal cell carcinoma. 3.  Anemia  chronic kidney disease. 4.  T12 compression fracture.  Plan: We are asked to see the patient for evaluation management of acute renal failure in the setting of chronic kidney disease stage III with a baseline creatinine of 1.7.  It appears that she's had rather poor by mouth intake at home recently.  She was also noted be on lisinopril and Lasix in a dehydrated state.  She is following with Dr. Erlene Quan for evaluation of a left renal mass that is suspicious for renal cell carcinoma.this mass is 18 mm in size.  Continue IV fluid hydration for now.  No urgent indication for dialysis as a patient is producing urine.  Agree with holding Lasix and lisinopril for now.  We will proceed with additional workup including SPEP, UPEP.  Further plan as patient progresses.

## 2018-03-19 ENCOUNTER — Encounter: Payer: Self-pay | Admitting: Anesthesiology

## 2018-03-19 ENCOUNTER — Encounter: Payer: Self-pay | Admitting: Urology

## 2018-03-19 LAB — GLUCOSE, CAPILLARY
GLUCOSE-CAPILLARY: 199 mg/dL — AB (ref 70–99)
GLUCOSE-CAPILLARY: 216 mg/dL — AB (ref 70–99)
Glucose-Capillary: 168 mg/dL — ABNORMAL HIGH (ref 70–99)
Glucose-Capillary: 155 mg/dL — ABNORMAL HIGH (ref 70–99)

## 2018-03-19 LAB — PROTEIN ELECTROPHORESIS, SERUM
A/G RATIO SPE: 0.9 (ref 0.7–1.7)
ALBUMIN ELP: 3.2 g/dL (ref 2.9–4.4)
Alpha-1-Globulin: 0.2 g/dL (ref 0.0–0.4)
Alpha-2-Globulin: 0.6 g/dL (ref 0.4–1.0)
Beta Globulin: 1 g/dL (ref 0.7–1.3)
Gamma Globulin: 1.6 g/dL (ref 0.4–1.8)
Globulin, Total: 3.4 g/dL (ref 2.2–3.9)
TOTAL PROTEIN ELP: 6.6 g/dL (ref 6.0–8.5)

## 2018-03-19 LAB — BASIC METABOLIC PANEL
ANION GAP: 5 (ref 5–15)
BUN: 35 mg/dL — AB (ref 8–23)
CO2: 20 mmol/L — ABNORMAL LOW (ref 22–32)
Calcium: 9.5 mg/dL (ref 8.9–10.3)
Chloride: 116 mmol/L — ABNORMAL HIGH (ref 98–111)
Creatinine, Ser: 2.44 mg/dL — ABNORMAL HIGH (ref 0.44–1.00)
GFR calc Af Amer: 21 mL/min — ABNORMAL LOW (ref >60)
GFR, EST NON AFRICAN AMERICAN: 18 mL/min — AB (ref >60)
Glucose, Bld: 158 mg/dL — ABNORMAL HIGH (ref 70–99)
POTASSIUM: 4.4 mmol/L (ref 3.5–5.1)
SODIUM: 141 mmol/L (ref 135–145)

## 2018-03-19 MED ORDER — CEFAZOLIN (ANCEF) 1 G IV SOLR: 1.0000 g | INTRAVENOUS | Status: DC

## 2018-03-19 MED ORDER — CEFAZOLIN SODIUM-DEXTROSE 1-4 GM/50ML-% IV SOLN
1.0000 g | INTRAVENOUS | Status: AC
  Administered 2018-03-20: 1 g via INTRAVENOUS
  Filled 2018-03-19: qty 50

## 2018-03-19 NOTE — Progress Notes (Signed)
[facility]  ROUNDING NOTE   Subjective:  Renal function significantly improved. Creatinine down to 2.4. In better spirits today.   Objective:  Vital signs in last 24 hours:  Temp:  [98 F (36.7 C)-98.6 F (37 C)] 98.1 F (36.7 C) (09/11 0422) Pulse Rate:  [82-101] 82 (09/11 0422) Resp:  [16-20] 16 (09/11 0422) BP: (121-137)/(43-54) 122/43 (09/11 0422) SpO2:  [96 %-100 %] 96 % (09/11 0422)  Weight change:  Filed Weights   03/17/18 1123 03/17/18 1743  Weight: 78.5 kg 74.9 kg    Intake/Output: I/O last 3 completed shifts: In: 1604.6 [I.V.:1604.6] Out: 2325 [Urine:2325]   Intake/Output this shift:  Total I/O In: 1309.3 [P.O.:360; I.V.:949.3] Out: 400 [Urine:400]  Physical Exam: General: No acute distress  Head: Normocephalic, atraumatic. Moist oral mucosal membranes  Eyes: Anicteric  Neck: Supple, trachea midline  Lungs:  Clear to auscultation, normal effort  Heart: S1S2 no rubs  Abdomen:  Soft, nontender, bowel sounds present  Extremities: No peripheral edema.  Neurologic: Awake, alert, following commands  Skin: No lesions    Basic Metabolic Panel: Recent Labs  Lab 03/17/18 1021 03/17/18 1154 03/18/18 0405 03/19/18 0455  NA  --  130* 136 141  K  --  5.3* 4.6 4.4  CL  --  101 109 116*  CO2  --  12* 19* 20*  GLUCOSE  --  286* 203* 158*  BUN  --  67* 58* 35*  CREATININE 5.20* 5.20* 4.01* 2.44*  CALCIUM  --  9.8 9.3 9.5    Liver Function Tests: Recent Labs  Lab 03/17/18 1154  AST 44*  ALT 23  ALKPHOS 92  BILITOT 1.4*  PROT 8.2*  ALBUMIN 4.3   No results for input(s): LIPASE, AMYLASE in the last 168 hours. No results for input(s): AMMONIA in the last 168 hours.  CBC: Recent Labs  Lab 03/17/18 1154 03/18/18 0405  WBC 10.0 6.6  NEUTROABS 7.9*  --   HGB 12.4 10.7*  HCT 39.6 32.7*  MCV 105.9* 102.0*  PLT 193 170    Cardiac Enzymes: No results for input(s): CKTOTAL, CKMB, CKMBINDEX, TROPONINI in the last 168  hours.  BNP: Invalid input(s): POCBNP  CBG: Recent Labs  Lab 03/18/18 0744 03/18/18 1134 03/18/18 1647 03/18/18 2114 03/19/18 0752  GLUCAP 169* 148* 168* 199* 155*    Microbiology: Results for orders placed or performed during the hospital encounter of 02/20/18  CULTURE, BLOOD (ROUTINE X 2) w Reflex to ID Panel     Status: None   Collection Time: 02/23/18  7:05 PM  Result Value Ref Range Status   Specimen Description BLOOD LEFT WRIST  Final   Special Requests   Final    BOTTLES DRAWN AEROBIC AND ANAEROBIC Blood Culture results may not be optimal due to an inadequate volume of blood received in culture bottles   Culture   Final    NO GROWTH 5 DAYS Performed at [facility], [address] [place], [place] [address]    Report Status 02/28/2018 FINAL  Final  CULTURE, BLOOD (ROUTINE X 2) w Reflex to ID Panel     Status: None   Collection Time: 02/23/18  7:05 PM  Result Value Ref Range Status   Specimen Description BLOOD LEFT HAND  Final   Special Requests   Final    BOTTLES DRAWN AEROBIC AND ANAEROBIC Blood Culture adequate volume   Culture   Final    NO GROWTH 5 DAYS Performed at [facility], [place]  [address] [place], [place] [address]    Report Status 02/28/2018 FINAL  Final    Coagulation Studies: No results for input(s): LABPROT, INR in the last 72 hours.  Urinalysis: Recent Labs    03/17/18 1850  COLORURINE YELLOW*  LABSPEC 1.014  PHURINE 5.0  GLUCOSEU NEGATIVE  HGBUR NEGATIVE  BILIRUBINUR NEGATIVE  KETONESUR NEGATIVE  PROTEINUR NEGATIVE  NITRITE NEGATIVE  LEUKOCYTESUR NEGATIVE      Imaging: Ct Renal Stone Study  Result Date: 03/17/2018 CLINICAL DATA:  Low back pain and nausea for the past 2 months. EXAM: CT ABDOMEN AND PELVIS WITHOUT CONTRAST TECHNIQUE: Multidetector CT imaging of the abdomen and pelvis was performed following the standard protocol without IV contrast. COMPARISON:  Chest x-ray dated January 29, 2018. CT abdomen dated  January 30, 2017. FINDINGS: Lower chest: No acute abnormality. Unchanged mild subpleural reticulation at both lung bases. Hepatobiliary: Unchanged cirrhosis. No focal liver abnormality. Status post cholecystectomy. No biliary dilatation. Pancreas: Unremarkable. No pancreatic ductal dilatation or surrounding inflammatory changes. Spleen: Stable mild splenomegaly.  No focal abnormality. Adrenals/Urinary Tract: The adrenal glands and right kidney are unremarkable. Grossly unchanged 18 mm exophytic lesion arising from the upper pole of the left kidney. No renal or ureteral calculi. No hydronephrosis. The bladder is unremarkable. Stomach/Bowel: Stomach is within normal limits. Appendix appears normal. No evidence of bowel wall thickening, distention, or inflammatory changes. Mild colonic diverticulosis. Vascular/Lymphatic: Aortic atherosclerosis. Unchanged recanalized umbilical vein. No enlarged abdominal or pelvic lymph nodes. Reproductive: Uterus and bilateral adnexa are unremarkable. Other: No free fluid or pneumoperitoneum. Musculoskeletal: New subacute T12 compression fracture with 50% height loss and 5 mm retropulsion. Unchanged severe degenerative disc disease at L2-L3. IMPRESSION: 1. New subacute T12 compression fracture with 50% height loss and 5 mm retropulsion. 2. Grossly unchanged exophytic left renal lesion, concerning for renal neoplasm. 3. Unchanged cirrhosis and sequelae of portal hypertension mild splenomegaly. 4.  Aortic atherosclerosis (ICD10-I70.0). Electronically Signed   By: Titus Dubin M.D.   On: 03/17/2018 12:38     Medications:   . sodium chloride 75 mL/hr at 03/19/18 0825   . aspirin EC  81 mg Oral Daily  . cholecalciferol  1,000 Units Oral Daily  . feeding supplement (ENSURE ENLIVE)  237 mL Oral BID BM  . Ferrous Fumarate  1 tablet Oral BID  . heparin  5,000 Units Subcutaneous Q8H  . insulin aspart  0-9 Units Subcutaneous TID WC  . loratadine  10 mg Oral Daily  . pantoprazole   40 mg Oral Daily  . simvastatin  20 mg Oral QHS  . vitamin B-12  1,000 mcg Oral Daily   bisacodyl, docusate sodium, morphine injection, ondansetron (ZOFRAN) IV, oxyCODONE-acetaminophen  Assessment/ Plan:  77 y.o. female with a PMHx of right breast cancer status post mastectomy, chronic kidney disease stage III, diabetes mellitus type 2, glaucoma, hyperlipidemia, hypertension, peripheral neuropathy, rheumatoid arthritis, left renal mass, who was admitted to [facility] on 03/17/2018 for evaluation of acute renal failure.   1.  Acute renal failure/chronic kidney disease stage III.  Acute renal failure likely secondary to dehydration and poor by mouth intake as well as taking Lasix and lisinopril in a dehydrated state. 2.  Left renal mass suspicious for renal cell carcinoma. 3.  Anemia chronic kidney disease. 4.  T12 compression fracture.  Plan: Patient seen at bedside.  Her renal function has significantly improved with IV fluid hydration.  Continue 0.9 normal saline at 75 cc/h.  Appreciate urology input regarding left renal mass which is suspicious  for underlying small and slow-growing renal cell carcinoma.  She has anemia of chronic kidney disease but given the renal mass we recommend avoiding Epogen.  Appreciate input from orthopedics given her T12 compression fracture as well.  Avoid nephrotoxins as possible.   LOS: 2 Wilhelm Ganaway 9/11/201910:32 AM

## 2018-03-19 NOTE — Anesthesia Preprocedure Evaluation (Addendum)
Anesthesia Evaluation  Patient identified by MRN, date of birth, ID band Patient awake    Reviewed: Allergy & Precautions, H&P , NPO status , Patient's Chart, lab work & pertinent test results  History of Anesthesia Complications Negative for: history of anesthetic complications  Airway Mallampati: III  TM Distance: <3 FB Neck ROM: limited    Dental  (+) Chipped, Poor Dentition, Missing   Pulmonary shortness of breath and with exertion, former smoker,           Cardiovascular Exercise Tolerance: Good hypertension, (-) angina+ Peripheral Vascular Disease  (-) Past MI and (-) DOE      Neuro/Psych  Neuromuscular disease negative psych ROS   GI/Hepatic negative GI ROS, Neg liver ROS,   Endo/Other  diabetes, Type 2, Insulin Dependent  Renal/GU CRF, ARF and Renal InsufficiencyRenal disease  negative genitourinary   Musculoskeletal  (+) Arthritis ,   Abdominal   Peds  Hematology negative hematology ROS (+) anemia ,   Anesthesia Other Findings   Past Medical History: No date: Cancer ([place])     Comment:  breast cancer right mastectomy No date: Diabetes mellitus without complication (HCC) No date: Glaucoma No date: Hyperlipidemia No date: Hypertension No date: Neuropathy No date: Osteoarthritis No date: Rheumatoid arthritis ([place]) No date: Telangiectasias  Past Surgical History: No date: BREAST SURGERY     Comment:  right mastectomy No date: CHOLECYSTECTOMY No date: COLONOSCOPY 08/14/2016: COLONOSCOPY WITH PROPOFOL; N/A     Comment:  Procedure: COLONOSCOPY WITH PROPOFOL;  Surgeon: Lollie Sails, MD;  Location: [facility] ENDOSCOPY;  Service:               Endoscopy;  Laterality: N/A; 08/14/2016: ESOPHAGOGASTRODUODENOSCOPY (EGD) WITH PROPOFOL; N/A     Comment:  Procedure: ESOPHAGOGASTRODUODENOSCOPY (EGD) WITH               PROPOFOL;  Surgeon: Lollie Sails, MD;  Location:               [facility] ENDOSCOPY;   Service: Endoscopy;  Laterality: N/A; No date: EXCISIONAL HEMORRHOIDECTOMY  BMI    Body Mass Index:  31.59 kg/m      Reproductive/Obstetrics negative OB ROS                           Anesthesia Physical  Anesthesia Plan  ASA: III  Anesthesia Plan: General   Post-op Pain Management:    Induction: Intravenous  PONV Risk Score and Plan: Propofol infusion and TIVA  Airway Management Planned: Nasal Cannula  Additional Equipment:   Intra-op Plan:   Post-operative Plan:   Informed Consent: I have reviewed the patients History and Physical, chart, labs and discussed the procedure including the risks, benefits and alternatives for the proposed anesthesia with the patient or authorized representative who has indicated his/her understanding and acceptance.   Dental Advisory Given  Plan Discussed with: Anesthesiologist, CRNA and Surgeon  Anesthesia Plan Comments: (Plan to suspend DNR for procedure.  Patient and family voiced understanding.  Patient consented for risks of anesthesia including but not limited to:  - adverse reactions to medications - risk of intubation if required - damage to teeth, lips or other oral mucosa - sore throat or hoarseness - Damage to heart, brain, lungs or loss of life  Patient voiced understanding.)       Anesthesia Quick Evaluation

## 2018-03-19 NOTE — Progress Notes (Signed)
Discussed procedure with patient and family.  Risks and benefits discussed.  Plan T 12 kyphoplasty tomorrow am. Site marked and orders placed.

## 2018-03-19 NOTE — Progress Notes (Signed)
[place] at [place] NAME: Emily Faulkner    MR#:  [id number]  DATE OF BIRTH:  04/30/1941  SUBJECTIVE:  CHIEF COMPLAINT:   Chief Complaint  Patient presents with  . Abnormal Lab   Better back pain.  Generalized weakness. REVIEW OF SYSTEMS:  Review of Systems  Constitutional: Positive for malaise/fatigue. Negative for chills and fever.  HENT: Negative for sore throat.   Eyes: Negative for blurred vision and double vision.  Respiratory: Negative for cough, hemoptysis, shortness of breath, wheezing and stridor.   Cardiovascular: Negative for chest pain, palpitations, orthopnea and leg swelling.  Gastrointestinal: Negative for abdominal pain, blood in stool, diarrhea, melena, nausea and vomiting.  Genitourinary: Negative for dysuria, flank pain and hematuria.  Musculoskeletal: Negative for back pain and joint pain.  Skin: Negative for rash.  Neurological: Negative for dizziness, sensory change, focal weakness, seizures, loss of consciousness, weakness and headaches.  Endo/Heme/Allergies: Negative for polydipsia.  Psychiatric/Behavioral: Negative for depression. The patient is not nervous/anxious.     DRUG ALLERGIES:  No Known Allergies VITALS:  Blood pressure (!) 111/56, pulse 91, temperature 98.4 F (36.9 C), temperature source Oral, resp. rate 18, height 5' 4"  (1.626 m), weight 74.9 kg, SpO2 96 %. PHYSICAL EXAMINATION:  Physical Exam  Constitutional: She is oriented to person, place, and time. She appears well-developed.  HENT:  Head: Normocephalic.  Mouth/Throat: Oropharynx is clear and moist.  Eyes: Pupils are equal, round, and reactive to light. Conjunctivae and EOM are normal. No scleral icterus.  Neck: Normal range of motion. Neck supple. No JVD present. No tracheal deviation present.  Cardiovascular: Normal rate, regular rhythm and normal heart sounds. Exam reveals no gallop.  No murmur heard. Pulmonary/Chest: Effort normal  and breath sounds normal. No respiratory distress. She has no wheezes. She has no rales.  Abdominal: Soft. Bowel sounds are normal. She exhibits no distension. There is no tenderness. There is no rebound.  Musculoskeletal: Normal range of motion. She exhibits no edema or tenderness.  Neurological: She is alert and oriented to person, place, and time. No cranial nerve deficit.  Skin: No rash noted. No erythema.  Psychiatric: She has a normal mood and affect.   LABORATORY PANEL:  Female CBC Recent Labs  Lab 03/18/18 0405  WBC 6.6  HGB 10.7*  HCT 32.7*  PLT 170   ------------------------------------------------------------------------------------------------------------------ Chemistries  Recent Labs  Lab 03/17/18 1154  03/19/18 0455  NA 130*   < > 141  K 5.3*   < > 4.4  CL 101   < > 116*  CO2 12*   < > 20*  GLUCOSE 286*   < > 158*  BUN 67*   < > 35*  CREATININE 5.20*   < > 2.44*  CALCIUM 9.8   < > 9.5  AST 44*  --   --   ALT 23  --   --   ALKPHOS 92  --   --   BILITOT 1.4*  --   --    < > = values in this interval not displayed.   RADIOLOGY:  No results found. ASSESSMENT AND PLAN:   *Acute on chronic renal failure stage 3.  Continue IV fluid, follow-up renal ultrasound and BMP per Dr. Holley Raring. Hold antihypertensive and nephrotoxic medications. Proving.  Renal mass, follow-up with urologist as outpatient.   *Compression fracture on T12 Pain control. Kyphoplasty per Dr. Rudene Christians.  *Generalized weakness, fatigue, loss of appetite Improving.  PT evaluation.  *Diabetes  Hold oral diabetic medications, keep on sliding scale coverage with insulin.  Discussed with Dr. Holley Raring. All the records are reviewed and case discussed with Care Management/Social Worker. Management plans discussed with the patient, daughter-in-law and they are in agreement.  CODE STATUS: DNR  TOTAL TIME TAKING CARE OF THIS PATIENT: 33 minutes.   More than 50% of the time was spent in  counseling/coordination of care: YES  POSSIBLE D/C IN 2 DAYS, DEPENDING ON CLINICAL CONDITION.   Demetrios Loll M.D on 03/19/2018 at 3:03 PM  Between 7am to 6pm - Pager - [phone]  After 6pm go to [email or web address] - [facility]

## 2018-03-20 ENCOUNTER — Inpatient Hospital Stay: Payer: Medicare HMO | Admitting: Anesthesiology

## 2018-03-20 ENCOUNTER — Inpatient Hospital Stay: Payer: Medicare HMO

## 2018-03-20 ENCOUNTER — Encounter
Admission: EM | Disposition: A | Discharge status: Home Health | Payer: Self-pay | Source: Home / Self Care | Attending: Internal Medicine

## 2018-03-20 ENCOUNTER — Encounter: Payer: Self-pay | Admitting: Anesthesiology

## 2018-03-20 ENCOUNTER — Telehealth: Payer: Self-pay | Admitting: Urology

## 2018-03-20 DIAGNOSIS — N2889 Other specified disorders of kidney and ureter: Secondary | ICD-10-CM

## 2018-03-20 HISTORY — PX: KYPHOPLASTY: SHX5884

## 2018-03-20 LAB — BASIC METABOLIC PANEL
ANION GAP: 5 (ref 5–15)
BUN: 21 mg/dL (ref 8–23)
CALCIUM: 9.3 mg/dL (ref 8.9–10.3)
CO2: 22 mmol/L (ref 22–32)
CREATININE: 1.64 mg/dL — AB (ref 0.44–1.00)
Chloride: 115 mmol/L — ABNORMAL HIGH (ref 98–111)
GFR calc non Af Amer: 29 mL/min — ABNORMAL LOW (ref >60)
GFR, EST AFRICAN AMERICAN: 34 mL/min — AB (ref >60)
Glucose, Bld: 183 mg/dL — ABNORMAL HIGH (ref 70–99)
Potassium: 4 mmol/L (ref 3.5–5.1)
SODIUM: 142 mmol/L (ref 135–145)

## 2018-03-20 LAB — GLUCOSE, CAPILLARY
GLUCOSE-CAPILLARY: 268 mg/dL — AB (ref 70–99)
GLUCOSE-CAPILLARY: 225 mg/dL — AB (ref 70–99)
GLUCOSE-CAPILLARY: 185 mg/dL — AB (ref 70–99)
GLUCOSE-CAPILLARY: 173 mg/dL — AB (ref 70–99)
Glucose-Capillary: 158 mg/dL — ABNORMAL HIGH (ref 70–99)
Glucose-Capillary: 171 mg/dL — ABNORMAL HIGH (ref 70–99)
Glucose-Capillary: 135 mg/dL — ABNORMAL HIGH (ref 70–99)

## 2018-03-20 LAB — PROTEIN ELECTRO, RANDOM URINE
ALPHA-2-GLOBULIN, U: 16.1 %
Albumin ELP, Urine: 15 %
Alpha-1-Globulin, U: 3.4 %
Beta Globulin, U: 25.5 %
Gamma Globulin, U: 40 %
TOTAL PROTEIN, URINE-UPE24: 9 mg/dL

## 2018-03-20 SURGERY — KYPHOPLASTY
Anesthesia: General

## 2018-03-20 MED ORDER — METOCLOPRAMIDE HCL 5 MG/ML IJ SOLN: 5.0000 mg | Freq: Three times a day (TID) | INTRAMUSCULAR | Status: DC | PRN

## 2018-03-20 MED ORDER — KETAMINE HCL 50 MG/ML IJ SOLN
INTRAMUSCULAR | Status: DC | PRN
  Administered 2018-03-20 (×2): 25 mg via INTRAMUSCULAR

## 2018-03-20 MED ORDER — PROPOFOL 10 MG/ML IV BOLUS
INTRAVENOUS | Status: AC
  Filled 2018-03-20: qty 20

## 2018-03-20 MED ORDER — KETAMINE HCL 50 MG/ML IJ SOLN
INTRAMUSCULAR | Status: AC
  Filled 2018-03-20: qty 10

## 2018-03-20 MED ORDER — LIDOCAINE HCL (PF) 1 % IJ SOLN
INTRAMUSCULAR | Status: AC
  Filled 2018-03-20: qty 30

## 2018-03-20 MED ORDER — METOCLOPRAMIDE HCL 5 MG PO TABS: 5.0000 mg | ORAL_TABLET | Freq: Three times a day (TID) | ORAL | Status: DC | PRN

## 2018-03-20 MED ORDER — OXYCODONE HCL 5 MG/5ML PO SOLN: 5.0000 mg | Freq: Once | ORAL | Status: DC | PRN

## 2018-03-20 MED ORDER — IOPAMIDOL (ISOVUE-M 200) INJECTION 41%
INTRAMUSCULAR | Status: AC
  Filled 2018-03-20: qty 20

## 2018-03-20 MED ORDER — PROPOFOL 500 MG/50ML IV EMUL
INTRAVENOUS | Status: DC | PRN
  Administered 2018-03-20: 50 ug/kg/min via INTRAVENOUS

## 2018-03-20 MED ORDER — FENTANYL CITRATE (PF) 100 MCG/2ML IJ SOLN: 25.0000 ug | INTRAMUSCULAR | Status: DC | PRN

## 2018-03-20 MED ORDER — IOPAMIDOL (ISOVUE-M 200) INJECTION 41%
INTRAMUSCULAR | Status: DC | PRN
  Administered 2018-03-20: 40 mL

## 2018-03-20 MED ORDER — DOCUSATE SODIUM 100 MG PO CAPS
100.0000 mg | ORAL_CAPSULE | Freq: Two times a day (BID) | ORAL | Status: DC
  Administered 2018-03-20 – 2018-03-21 (×2): 100 mg via ORAL
  Filled 2018-03-20 (×3): qty 1

## 2018-03-20 MED ORDER — BUPIVACAINE-EPINEPHRINE (PF) 0.5% -1:200000 IJ SOLN
INTRAMUSCULAR | Status: AC
  Filled 2018-03-20: qty 30

## 2018-03-20 MED ORDER — LIDOCAINE HCL 1 % IJ SOLN
INTRAMUSCULAR | Status: DC | PRN
  Administered 2018-03-20: 30 mL

## 2018-03-20 MED ORDER — OXYCODONE HCL 5 MG PO TABS: 5.0000 mg | ORAL_TABLET | Freq: Once | ORAL | Status: DC | PRN

## 2018-03-20 MED ORDER — BUPIVACAINE-EPINEPHRINE (PF) 0.5% -1:200000 IJ SOLN
INTRAMUSCULAR | Status: DC | PRN
  Administered 2018-03-20: 20 mL via PERINEURAL

## 2018-03-20 MED ORDER — ONDANSETRON HCL 4 MG PO TABS: 4.0000 mg | ORAL_TABLET | Freq: Four times a day (QID) | ORAL | Status: DC | PRN

## 2018-03-20 MED ORDER — ONDANSETRON HCL 4 MG/2ML IJ SOLN: 4.0000 mg | Freq: Four times a day (QID) | INTRAMUSCULAR | Status: DC | PRN

## 2018-03-20 MED ORDER — MIDAZOLAM HCL 2 MG/2ML IJ SOLN
INTRAMUSCULAR | Status: AC
  Filled 2018-03-20: qty 2

## 2018-03-20 MED ORDER — MIDAZOLAM HCL 2 MG/2ML IJ SOLN
INTRAMUSCULAR | Status: DC | PRN
  Administered 2018-03-20 (×2): 1 mg via INTRAVENOUS

## 2018-03-20 SURGICAL SUPPLY — 18 items
CEMENT KYPHON CX01A KIT/MIXER (Cement) ×3 IMPLANT
DERMABOND ADVANCED (GAUZE/BANDAGES/DRESSINGS) ×2
DERMABOND ADVANCED .7 DNX12 (GAUZE/BANDAGES/DRESSINGS) ×1 IMPLANT
DEVICE BIOPSY BONE KYPH (INSTRUMENTS) ×3 IMPLANT
DEVICE BIOPSY BONE KYPHX (INSTRUMENTS) IMPLANT
DRAPE C-ARM XRAY 36X54 (DRAPES) ×3 IMPLANT
DURAPREP 26ML APPLICATOR (WOUND CARE) ×3 IMPLANT
GLOVE SURG SYN 9.0  PF PI (GLOVE) ×2
GLOVE SURG SYN 9.0 PF PI (GLOVE) ×1 IMPLANT
GOWN SRG 2XL LVL 4 RGLN SLV (GOWNS) ×1 IMPLANT
GOWN STRL NON-REIN 2XL LVL4 (GOWNS) ×2
GOWN STRL REUS W/ TWL LRG LVL3 (GOWN DISPOSABLE) ×1 IMPLANT
GOWN STRL REUS W/TWL LRG LVL3 (GOWN DISPOSABLE) ×2
PACK KYPHOPLASTY (MISCELLANEOUS) ×3 IMPLANT
STRAP SAFETY 5IN WIDE (MISCELLANEOUS) ×3 IMPLANT
TRAY KYPHOPAK 15/2 EXPRESS (KITS) ×3 IMPLANT
TRAY KYPHOPAK 15/3 EXPRESS 1ST (MISCELLANEOUS) ×3 IMPLANT
TRAY KYPHOPAK 20/3 EXPRESS 1ST (MISCELLANEOUS) IMPLANT

## 2018-03-20 NOTE — Transfer of Care (Signed)
Immediate Anesthesia Transfer of Care Note  Patient: Emily Faulkner  Procedure(s) Performed: Coralyn Helling (N/A )  Patient Location: PACU  Anesthesia Type:General  Level of Consciousness: sedated  Airway & Oxygen Therapy: Patient Spontanous Breathing and Patient connected to nasal cannula oxygen  Post-op Assessment: Report given to RN and Post -op Vital signs reviewed and stable  Post vital signs: Reviewed and stable  Last Vitals:  Vitals Value Taken Time  BP 121/46 03/20/2018 11:52 AM  Temp    Pulse 102 03/20/2018 11:52 AM  Resp 15 03/20/2018 11:52 AM  SpO2 100 % 03/20/2018 11:52 AM  Vitals shown include unvalidated device data.  Last Pain:  Vitals:   03/20/18 0948  TempSrc: Tympanic  PainSc: 4       Patients Stated Pain Goal: 2 (95/70/22 0266)  Complications: No apparent anesthesia complications

## 2018-03-20 NOTE — Anesthesia Procedure Notes (Signed)
Date/Time: 03/20/2018 11:20 AM Performed by: Nelda Marseille, CRNA Pre-anesthesia Checklist: Patient identified, Emergency Drugs available, Suction available, Patient being monitored and Timeout performed Oxygen Delivery Method: Nasal cannula

## 2018-03-20 NOTE — Progress Notes (Signed)
[facility] at [place] NAME: Emily Faulkner    MR#:  [id number]  DATE OF BIRTH:  [birth year]  SUBJECTIVE:  CHIEF COMPLAINT:   Chief Complaint  Patient presents with  . Abnormal Lab   Better back pain.  Generalized weakness.  S/P kyphoplasty. REVIEW OF SYSTEMS:  Review of Systems  Constitutional: Positive for malaise/fatigue. Negative for chills and fever.  HENT: Negative for sore throat.   Eyes: Negative for blurred vision and double vision.  Respiratory: Negative for cough, hemoptysis, shortness of breath, wheezing and stridor.   Cardiovascular: Negative for chest pain, palpitations, orthopnea and leg swelling.  Gastrointestinal: Negative for abdominal pain, blood in stool, diarrhea, melena, nausea and vomiting.  Genitourinary: Negative for dysuria, flank pain and hematuria.  Musculoskeletal: Negative for back pain and joint pain.  Skin: Negative for rash.  Neurological: Negative for dizziness, sensory change, focal weakness, seizures, loss of consciousness, weakness and headaches.  Endo/Heme/Allergies: Negative for polydipsia.  Psychiatric/Behavioral: Negative for depression. The patient is not nervous/anxious.     DRUG ALLERGIES:  No Known Allergies VITALS:  Blood pressure (!) 122/54, pulse 99, temperature 98.8 F (37.1 C), temperature source Oral, resp. rate 17, height 5' 4"  (1.626 m), weight 74.9 kg, SpO2 99 %. PHYSICAL EXAMINATION:  Physical Exam  Constitutional: She is oriented to person, place, and time. She appears well-developed.  HENT:  Head: Normocephalic.  Mouth/Throat: Oropharynx is clear and moist.  Eyes: Pupils are equal, round, and reactive to light. Conjunctivae and EOM are normal. No scleral icterus.  Neck: Normal range of motion. Neck supple. No JVD present. No tracheal deviation present.  Cardiovascular: Normal rate, regular rhythm and normal heart sounds. Exam reveals no gallop.  No murmur  heard. Pulmonary/Chest: Effort normal and breath sounds normal. No respiratory distress. She has no wheezes. She has no rales.  Abdominal: Soft. Bowel sounds are normal. She exhibits no distension. There is no tenderness. There is no rebound.  Musculoskeletal: Normal range of motion. She exhibits no edema or tenderness.  Neurological: She is alert and oriented to person, place, and time. No cranial nerve deficit.  Skin: No rash noted. No erythema.  Psychiatric: She has a normal mood and affect.   LABORATORY PANEL:  Female CBC Recent Labs  Lab 03/18/18 0405  WBC 6.6  HGB 10.7*  HCT 32.7*  PLT 170   ------------------------------------------------------------------------------------------------------------------ Chemistries  Recent Labs  Lab 03/17/18 1154  03/20/18 0307  NA 130*   < > 142  K 5.3*   < > 4.0  CL 101   < > 115*  CO2 12*   < > 22  GLUCOSE 286*   < > 183*  BUN 67*   < > 21  CREATININE 5.20*   < > 1.64*  CALCIUM 9.8   < > 9.3  AST 44*  --   --   ALT 23  --   --   ALKPHOS 92  --   --   BILITOT 1.4*  --   --    < > = values in this interval not displayed.   RADIOLOGY:  Dg Thoracic Spine 2 View  Result Date: 03/20/2018 CLINICAL DATA:  Post kyphoplasty. EXAM: THORACIC SPINE 2 VIEWS; DG C-ARM 61-120 MIN COMPARISON:  Body CT 03/17/2018 FINDINGS: Fluoroscopic images of the thoraco lumbar junction demonstrate interval kyphoplasty of T12 vertebral body with improvement in the previously demonstrated height loss of the superior endplate. The fluoroscopy time is recorded as 1 minutes  39 seconds IMPRESSION: Kyphoplasty of T12 vertebral body with improvement in the previously demonstrated height loss of the superior endplate. Electronically Signed   By: Fidela Salisbury M.D.   On: 03/20/2018 11:57   Dg C-arm 1-60 Min  Result Date: 03/20/2018 CLINICAL DATA:  Post kyphoplasty. EXAM: THORACIC SPINE 2 VIEWS; DG C-ARM 61-120 MIN COMPARISON:  Body CT 03/17/2018 FINDINGS:  Fluoroscopic images of the thoraco lumbar junction demonstrate interval kyphoplasty of T12 vertebral body with improvement in the previously demonstrated height loss of the superior endplate. The fluoroscopy time is recorded as 1 minutes 39 seconds IMPRESSION: Kyphoplasty of T12 vertebral body with improvement in the previously demonstrated height loss of the superior endplate. Electronically Signed   By: Fidela Salisbury M.D.   On: 03/20/2018 11:57   ASSESSMENT AND PLAN:   *Acute on chronic renal failure stage 3.  Improving with IV fluid. Hold antihypertensive and nephrotoxic medications.  Renal mass, follow-up with urologist as outpatient.   *Compression fracture on T12 Pain control. Kyphoplasty by Dr. Rudene Christians.  *Generalized weakness, fatigue, loss of appetite Improving.  PT evaluation.  *Diabetes Hold oral diabetic medications, keep on sliding scale coverage with insulin.  Hypertension.  Hold lisinopril and Lasix due to low side blood pressure.  All the records are reviewed and case discussed with Care Management/Social Worker. Management plans discussed with the patient, daughter-in-law and they are in agreement.  CODE STATUS: DNR  TOTAL TIME TAKING CARE OF THIS PATIENT: 27 minutes.   More than 50% of the time was spent in counseling/coordination of care: YES  POSSIBLE D/C IN 1-2 DAYS, DEPENDING ON CLINICAL CONDITION.   Demetrios Loll M.D on 03/20/2018 at 2:27 PM  Between 7am to 6pm - Pager - [phone]  After 6pm go to [email or web address] - [facility]

## 2018-03-20 NOTE — Progress Notes (Signed)
[facility]  ROUNDING NOTE   Subjective:  Patient seen at bedside. Creatinine down to 1.6. In good spirits.   Objective:  Vital signs in last 24 hours:  Temp:  [97.2 F (36.2 C)-98.6 F (37 C)] 97.2 F (36.2 C) (09/12 0948) Pulse Rate:  [90-99] 90 (09/12 0948) Resp:  [16-22] 17 (09/12 0948) BP: (111-142)/(54-58) 120/58 (09/12 0948) SpO2:  [96 %-100 %] 98 % (09/12 0948)  Weight change:  Filed Weights   03/17/18 1123 03/17/18 1743  Weight: 78.5 kg 74.9 kg    Intake/Output: I/O last 3 completed shifts: In: 2054.1 [P.O.:360; I.V.:1694.1] Out: 1850 [Urine:1850]   Intake/Output this shift:  Total I/O In: 933.1 [I.V.:933.1] Out: -   Physical Exam: General: No acute distress  Head: Normocephalic, atraumatic. Moist oral mucosal membranes  Eyes: Anicteric  Neck: Supple, trachea midline  Lungs:  Clear to auscultation, normal effort  Heart: S1S2 no rubs  Abdomen:  Soft, nontender, bowel sounds present  Extremities: No peripheral edema.  Neurologic: Awake, alert, following commands  Skin: No lesions    Basic Metabolic Panel: Recent Labs  Lab 03/17/18 1021  03/17/18 1154 03/18/18 0405 03/19/18 0455 03/20/18 0307  NA  --   --  130* 136 141 142  K  --   --  5.3* 4.6 4.4 4.0  CL  --   --  101 109 116* 115*  CO2  --   --  12* 19* 20* 22  GLUCOSE  --   --  286* 203* 158* 183*  BUN  --   --  67* 58* 35* 21  CREATININE 5.20*  --  5.20* 4.01* 2.44* 1.64*  CALCIUM  --    < > 9.8 9.3 9.5 9.3   < > = values in this interval not displayed.    Liver Function Tests: Recent Labs  Lab 03/17/18 1154  AST 44*  ALT 23  ALKPHOS 92  BILITOT 1.4*  PROT 8.2*  ALBUMIN 4.3   No results for input(s): LIPASE, AMYLASE in the last 168 hours. No results for input(s): AMMONIA in the last 168 hours.  CBC: Recent Labs  Lab 03/17/18 1154 03/18/18 0405  WBC 10.0 6.6  NEUTROABS 7.9*  --   HGB 12.4 10.7*  HCT 39.6 32.7*  MCV 105.9* 102.0*  PLT 193 170     Cardiac Enzymes: No results for input(s): CKTOTAL, CKMB, CKMBINDEX, TROPONINI in the last 168 hours.  BNP: Invalid input(s): POCBNP  CBG: Recent Labs  Lab 03/19/18 1140 03/19/18 1625 03/19/18 2109 03/20/18 0744 03/20/18 0948  GLUCAP 216* 268* 225* 185* 158*    Microbiology: Results for orders placed or performed during the hospital encounter of 02/20/18  CULTURE, BLOOD (ROUTINE X 2) w Reflex to ID Panel     Status: None   Collection Time: 02/23/18  7:05 PM  Result Value Ref Range Status   Specimen Description BLOOD LEFT WRIST  Final   Special Requests   Final    BOTTLES DRAWN AEROBIC AND ANAEROBIC Blood Culture results may not be optimal due to an inadequate volume of blood received in culture bottles   Culture   Final    NO GROWTH 5 DAYS Performed at [facility], [address] [place], [place] [address]    Report Status 02/28/2018 FINAL  Final  CULTURE, BLOOD (ROUTINE X 2) w Reflex to ID Panel     Status: None   Collection Time: 02/23/18  7:05 PM  Result Value Ref Range Status  Specimen Description BLOOD LEFT HAND  Final   Special Requests   Final    BOTTLES DRAWN AEROBIC AND ANAEROBIC Blood Culture adequate volume   Culture   Final    NO GROWTH 5 DAYS Performed at [facility], [address] [place], [place] [address]    Report Status 02/28/2018 FINAL  Final    Coagulation Studies: No results for input(s): LABPROT, INR in the last 72 hours.  Urinalysis: Recent Labs    03/17/18 1850  COLORURINE YELLOW*  LABSPEC 1.014  PHURINE 5.0  GLUCOSEU NEGATIVE  HGBUR NEGATIVE  BILIRUBINUR NEGATIVE  KETONESUR NEGATIVE  PROTEINUR NEGATIVE  NITRITE NEGATIVE  LEUKOCYTESUR NEGATIVE      Imaging: No results found.   Medications:   . sodium chloride 75 mL/hr at 03/20/18 0718   . [MAR Hold] aspirin EC  81 mg Oral Daily  . [MAR Hold] cholecalciferol  1,000 Units Oral Daily  . [MAR Hold] feeding supplement (ENSURE ENLIVE)  237 mL  Oral BID BM  . [MAR Hold] Ferrous Fumarate  1 tablet Oral BID  . [MAR Hold] insulin aspart  0-9 Units Subcutaneous TID WC  . [MAR Hold] loratadine  10 mg Oral Daily  . [MAR Hold] pantoprazole  40 mg Oral Daily  . [MAR Hold] simvastatin  20 mg Oral QHS  . [MAR Hold] vitamin B-12  1,000 mcg Oral Daily   [MAR Hold] bisacodyl, [MAR Hold] docusate sodium, [MAR Hold]  morphine injection, [MAR Hold] ondansetron (ZOFRAN) IV, [MAR Hold] oxyCODONE-acetaminophen  Assessment/ Plan:  77 y.o. female with a PMHx of right breast cancer status post mastectomy, chronic kidney disease stage III, diabetes mellitus type 2, glaucoma, hyperlipidemia, hypertension, peripheral neuropathy, rheumatoid arthritis, left renal mass, who was admitted to [facility] on 03/17/2018 for evaluation of acute renal failure.   1.  Acute renal failure/chronic kidney disease stage III.  Acute renal failure likely secondary to dehydration and poor by mouth intake as well as taking Lasix and lisinopril in a dehydrated state. 2.  Left renal mass suspicious for renal cell carcinoma. 3.  Anemia chronic kidney disease. 4.  T12 compression fracture.  Plan: Patient seen and evaluated at bedside.  Creatinine down to 1.6 now.  Continue IV fluids for now given the fact that patient undergoing surgical procedure.  She will need continued follow-up with urology for her left renal mass.  They have recommended follow-up ultrasound.  Hemoglobin stable at 10.7.  No indication for Procrit at the moment.  Orthopedics monitoring the patient for T12 compression fracture.    LOS: 3 Emily Faulkner 9/12/20199:53 AM

## 2018-03-20 NOTE — Anesthesia Postprocedure Evaluation (Signed)
Anesthesia Post Note  Patient: KESHIA WEARE  Procedure(s) Performed: Coralyn Helling (N/A )  Patient location during evaluation: PACU Anesthesia Type: General Level of consciousness: awake and alert Pain management: pain level controlled Vital Signs Assessment: post-procedure vital signs reviewed and stable Respiratory status: spontaneous breathing, nonlabored ventilation, respiratory function stable and patient connected to nasal cannula oxygen Cardiovascular status: blood pressure returned to baseline and stable Postop Assessment: no apparent nausea or vomiting Anesthetic complications: no     Last Vitals:  Vitals:   03/20/18 1347 03/20/18 1355  BP: (!) 118/58 (!) 122/54  Pulse: (!) 102 99  Resp: 20 17  Temp: 36.8 C 37.1 C  SpO2: 99% 99%    Last Pain:  Vitals:   03/20/18 1355  TempSrc: Oral  PainSc:                  Precious Haws Bernabe Dorce

## 2018-03-20 NOTE — Care Management Important Message (Signed)
Copy of signed Medicare IM left in room with family (patient out for procedure).

## 2018-03-20 NOTE — Telephone Encounter (Signed)
-----   Message from Abbie Sons, MD sent at 03/18/2018  8:28 PM EDT ----- Please schedule follow-up with Dr. Erlene Quan in 9 months with a renal ultrasound prior.

## 2018-03-20 NOTE — Op Note (Signed)
03/20/2018  11:55 AM  PATIENT:  Emily Faulkner  77 y.o. female  PRE-OPERATIVE DIAGNOSIS:  compression fracture t-12  POST-OPERATIVE DIAGNOSIS:  compression fracture t-12  PROCEDURE:  Procedure(s): KYPHOPLASTY-T12 (N/A)  SURGEON: Laurene Footman, MD  ASSISTANTS: none  ANESTHESIA:   local and MAC  EBL:  Total I/O In: 1433.1 [I.V.:1433.1] Out: -   BLOOD ADMINISTERED:none  DRAINS: none   LOCAL MEDICATIONS USED: Half percent Sensorcaine with epinephrine and Xylocaine 20 cc of each  SPECIMEN:  Source of Specimen:  T12 vertebral body  DISPOSITION OF SPECIMEN:  PATHOLOGY  COUNTS:  YES  TOURNIQUET:  * No tourniquets in log *  IMPLANTS: Bone cement  DICTATION: .Dragon Dictation was brought the operating room and after adequate anesthesia was given the patient was placed prone.  AP and lateral imaging was obtained that showed good visualization of the affected T12 vertebral body.  After patient identification and timeout procedures were completed the skin was prepped with alcohol and 5 cc 1% Xylocaine is infiltrated on both sides at T12.  The back was then prepped and draped you sterile fashion and repeat timeout procedure carried out.  Spinal needle was used to get local anesthetic down to the pedicle on the right side.  After allowing this to set a small incision was made and trocar advanced into the vertebral body using a para pedicular approach.  Biopsy was obtained followed by drilling and inflation of the balloon to 3 cc.  When the cement was the appropriate consistency the balloon was removed and approximately 4 and three-quarter cc of bone cement placed that gave very good fill to the vertebral body crossing the midline getting along the superior endplate where the fracture was most visible and in the center portion from inferior to superior endplates.  When the cemented set the trochars removed and permanent serum views obtained.  Dermabond used to close the skin followed by  Band-Aid  PLAN OF CARE: Continue as inpatient  PATIENT DISPOSITION:  PACU - hemodynamically stable.

## 2018-03-20 NOTE — Telephone Encounter (Signed)
App made but I did not see an order for the [employer]

## 2018-03-20 NOTE — Anesthesia Post-op Follow-up Note (Signed)
Anesthesia QCDR form completed.        

## 2018-03-21 ENCOUNTER — Encounter: Payer: Self-pay | Admitting: Orthopedic Surgery

## 2018-03-21 LAB — BASIC METABOLIC PANEL
Anion gap: 6 (ref 5–15)
BUN: 13 mg/dL (ref 8–23)
CALCIUM: 9 mg/dL (ref 8.9–10.3)
CO2: 20 mmol/L — ABNORMAL LOW (ref 22–32)
CREATININE: 1.49 mg/dL — AB (ref 0.44–1.00)
Chloride: 115 mmol/L — ABNORMAL HIGH (ref 98–111)
GFR, EST AFRICAN AMERICAN: 38 mL/min — AB (ref >60)
GFR, EST NON AFRICAN AMERICAN: 33 mL/min — AB (ref >60)
Glucose, Bld: 203 mg/dL — ABNORMAL HIGH (ref 70–99)
POTASSIUM: 3.9 mmol/L (ref 3.5–5.1)
SODIUM: 141 mmol/L (ref 135–145)

## 2018-03-21 LAB — GLUCOSE, CAPILLARY
GLUCOSE-CAPILLARY: 195 mg/dL — AB (ref 70–99)
GLUCOSE-CAPILLARY: 233 mg/dL — AB (ref 70–99)

## 2018-03-21 LAB — SURGICAL PATHOLOGY

## 2018-03-21 MED ORDER — TRAMADOL HCL 50 MG PO TABS: 50.0000 mg | ORAL_TABLET | Freq: Three times a day (TID) | ORAL | 0 refills | Status: AC | PRN

## 2018-03-21 NOTE — Discharge Summary (Signed)
[facility] NAME: Emily Faulkner    MR#:  [id number]  DATE OF BIRTH:  July 06, 1941  DATE OF ADMISSION:  03/17/2018   ADMITTING PHYSICIAN: Vaughan Basta, MD  DATE OF DISCHARGE: 03/21/2018  PRIMARY CARE PHYSICIAN: Sofie Hartigan, MD   ADMISSION DIAGNOSIS:  Compression fracture of T12 vertebra ([place]) [S22.080A] Acute renal failure, unspecified acute renal failure type ([employer]) [N17.9] DISCHARGE DIAGNOSIS:  Principal Problem:   Acute on chronic renal failure (HCC) Active Problems:   Renal mass  SECONDARY DIAGNOSIS:   Past Medical History:  Diagnosis Date  . Cancer [facility])    breast cancer right mastectomy  . Chronic kidney disease   . Diabetes mellitus without complication ([place])   . Glaucoma   . Hyperlipidemia   . Hypertension   . Neuropathy   . Osteoarthritis   . Rheumatoid arthritis ([place])   . Telangiectasias    HOSPITAL COURSE:   *Acute on chronic renal failure stage 3.  Improved with IV fluid. Hold antihypertensive and nephrotoxic medications.  Renal mass, follow-up with urologist as outpatient.  *Compression fracture on T12 Pain control. S/P Kyphoplasty by Dr. Rudene Christians.  *Generalized weakness, fatigue, loss of appetite Improving.  PT evaluation: Home health with PT.  *Diabetes Hold oral diabetic medications, on sliding scale coverage with insulin. Resume home medication after discharge.  Hypertension.  Hold lisinopril and Lasix due to low side blood pressure and renal failure. DISCHARGE CONDITIONS:  Stable, discharge to home with home health PT today. CONSULTS OBTAINED:  Treatment Team:  Leim Fabry, MD Anthonette Legato, MD Abbie Sons, MD Hessie Knows, MD DRUG ALLERGIES:  No Known Allergies DISCHARGE MEDICATIONS:   Allergies as of 03/21/2018   No Known Allergies     Medication List    STOP taking these medications   furosemide 40 MG tablet Commonly known as:  LASIX   lisinopril  2.5 MG tablet Commonly known as:  PRINIVIL,ZESTRIL     TAKE these medications   aspirin 81 MG EC tablet Take 81 mg by mouth daily.   clindamycin 1 % external solution Commonly known as:  CLEOCIN T Apply 1 application topically 2 (two) times daily.   clobetasol 0.05 % external solution Commonly known as:  TEMOVATE Apply 1 application topically every evening. (apply to scalp)   feeding supplement (ENSURE ENLIVE) Liqd Take 237 mLs by mouth 2 (two) times daily between meals.   ferrous fumarate 325 (106 Fe) MG Tabs tablet Commonly known as:  HEMOCYTE - 106 mg FE Take 1 tablet (106 mg of iron total) by mouth 2 (two) times daily.   Fish Oil 1000 MG Caps Take 1 capsule by mouth.   glipiZIDE 10 MG tablet Commonly known as:  GLUCOTROL Take 10 mg by mouth daily.   hydrOXYzine 10 MG tablet Commonly known as:  ATARAX/VISTARIL Take 10 mg by mouth at bedtime.   ketoconazole 2 % shampoo Commonly known as:  NIZORAL Apply 1 application topically daily. (apply to scalp and wait at least 30 minutes before rinsing)   LANTUS SOLOSTAR 100 UNIT/ML Solostar Pen Generic drug:  Insulin Glargine Inject 36 Units into the skin daily. (may take up to 50u based upon blood glucose reading)   loratadine 10 MG tablet Commonly known as:  CLARITIN Take 10 mg by mouth daily.   magnesium oxide 400 MG tablet Commonly known as:  MAG-OX Take 800 mg by mouth daily.   methocarbamol 500 MG tablet Commonly known as:  ROBAXIN  Take 500-1,500 mg by mouth See admin instructions. On day 1-3 take 1500 mg by mouth three times daily, on days 4-6 take 1000 mg by mouth three times daily, then take 1000 mg by mouth three times daily as needed.   niacin 500 MG CR tablet Commonly known as:  NIASPAN Take 500 mg by mouth every evening.   pantoprazole 40 MG tablet Commonly known as:  PROTONIX Take 1 tablet (40 mg total) by mouth daily.   potassium chloride SA 20 MEQ tablet Commonly known as:  K-DUR,KLOR-CON Take  20 mEq by mouth 2 (two) times daily.   simvastatin 20 MG tablet Commonly known as:  ZOCOR Take 20 mg by mouth at bedtime.   sitaGLIPtin 50 MG tablet Commonly known as:  JANUVIA Take 50 mg by mouth daily.   traMADol 50 MG tablet Commonly known as:  ULTRAM Take 1 tablet (50 mg total) by mouth every 8 (eight) hours as needed for up to 5 days for moderate pain or severe pain. What changed:  reasons to take this   TRAVATAN Z 0.004 % Soln ophthalmic solution Generic drug:  Travoprost (BAK Free) Place 1 drop into the left eye at bedtime.   vitamin B-12 1000 MCG tablet Commonly known as:  CYANOCOBALAMIN Take 1,000 mcg by mouth daily.   Vitamin D2 2000 units Tabs Take 1 tablet by mouth daily.        DISCHARGE INSTRUCTIONS:  See AVS.  If you experience worsening of your admission symptoms, develop shortness of breath, life threatening emergency, suicidal or homicidal thoughts you must seek medical attention immediately by calling 911 or calling your MD immediately  if symptoms less severe.  You Must read complete instructions/literature along with all the possible adverse reactions/side effects for all the Medicines you take and that have been prescribed to you. Take any new Medicines after you have completely understood and accpet all the possible adverse reactions/side effects.   Please note  You were cared for by a hospitalist during your hospital stay. If you have any questions about your discharge medications or the care you received while you were in the hospital after you are discharged, you can call the unit and asked to speak with the hospitalist on call if the hospitalist that took care of you is not available. Once you are discharged, your primary care physician will handle any further medical issues. Please note that NO REFILLS for any discharge medications will be authorized once you are discharged, as it is imperative that you return to your primary care physician (or  establish a relationship with a primary care physician if you do not have one) for your aftercare needs so that they can reassess your need for medications and monitor your lab values.    On the day of Discharge:  VITAL SIGNS:  Blood pressure (!) 135/57, pulse 99, temperature 98.8 F (37.1 C), temperature source Oral, resp. rate 20, height 5' 4"  (1.626 m), weight 74.9 kg, SpO2 97 %. PHYSICAL EXAMINATION:  GENERAL:  77 y.o.-year-old patient lying in the bed with no acute distress.  EYES: Pupils equal, round, reactive to light and accommodation. No scleral icterus. Extraocular muscles intact.  HEENT: Head atraumatic, normocephalic. Oropharynx and nasopharynx clear.  NECK:  Supple, no jugular venous distention. No thyroid enlargement, no tenderness.  LUNGS: Normal breath sounds bilaterally, no wheezing, rales,rhonchi or crepitation. No use of accessory muscles of respiration.  CARDIOVASCULAR: S1, S2 normal. No murmurs, rubs, or gallops.  ABDOMEN: Soft, non-tender, non-distended. Bowel  sounds present. No organomegaly or mass.  EXTREMITIES: No pedal edema, cyanosis, or clubbing.  NEUROLOGIC: Cranial nerves II through XII are intact. Muscle strength 4/5 in all extremities. Sensation intact. Gait not checked.  PSYCHIATRIC: The patient is alert and oriented x 3.  SKIN: No obvious rash, lesion, or ulcer.  DATA REVIEW:   CBC Recent Labs  Lab 03/18/18 0405  WBC 6.6  HGB 10.7*  HCT 32.7*  PLT 170    Chemistries  Recent Labs  Lab 03/17/18 1154  03/21/18 0315  NA 130*   < > 141  K 5.3*   < > 3.9  CL 101   < > 115*  CO2 12*   < > 20*  GLUCOSE 286*   < > 203*  BUN 67*   < > 13  CREATININE 5.20*   < > 1.49*  CALCIUM 9.8   < > 9.0  AST 44*  --   --   ALT 23  --   --   ALKPHOS 92  --   --   BILITOT 1.4*  --   --    < > = values in this interval not displayed.     Microbiology Results  Results for orders placed or performed during the hospital encounter of 02/20/18  CULTURE, BLOOD  (ROUTINE X 2) w Reflex to ID Panel     Status: None   Collection Time: 02/23/18  7:05 PM  Result Value Ref Range Status   Specimen Description BLOOD LEFT WRIST  Final   Special Requests   Final    BOTTLES DRAWN AEROBIC AND ANAEROBIC Blood Culture results may not be optimal due to an inadequate volume of blood received in culture bottles   Culture   Final    NO GROWTH 5 DAYS Performed at [facility], [address] [place], [place] [address]    Report Status 02/28/2018 FINAL  Final  CULTURE, BLOOD (ROUTINE X 2) w Reflex to ID Panel     Status: None   Collection Time: 02/23/18  7:05 PM  Result Value Ref Range Status   Specimen Description BLOOD LEFT HAND  Final   Special Requests   Final    BOTTLES DRAWN AEROBIC AND ANAEROBIC Blood Culture adequate volume   Culture   Final    NO GROWTH 5 DAYS Performed at [facility], [address] [place], [place] [address]    Report Status 02/28/2018 FINAL  Final    RADIOLOGY:  No results found.   Management plans discussed with the patient, family and they are in agreement.  CODE STATUS: DNR   TOTAL TIME TAKING CARE OF THIS PATIENT: 33 minutes.    Demetrios Loll M.D on 03/21/2018 at 2:59 PM  Between 7am to 6pm - Pager - [phone]  After 6pm go to [email or web address] - Proofreader  [facility]  Office  [phone]  CC: Primary care physician; Sofie Hartigan, MD   Note: This dictation was prepared with Dragon dictation along with smaller phrase technology. Any transcriptional errors that result from this process are unintentional.

## 2018-03-21 NOTE — Discharge Instructions (Signed)
HHPT Fall precaution.

## 2018-03-21 NOTE — Telephone Encounter (Signed)
Order entered

## 2018-03-21 NOTE — Progress Notes (Signed)
[facility]  ROUNDING NOTE   Subjective:  Patient seen at bedside. Creatinine down to 1.49. Blood glucose a bit high at 203 this a.m.   Objective:  Vital signs in last 24 hours:  Temp:  [97.2 F (36.2 C)-98.8 F (37.1 C)] 98.7 F (37.1 C) (09/13 0338) Pulse Rate:  [88-103] 98 (09/13 0338) Resp:  [15-20] 20 (09/13 0338) BP: (117-140)/(46-64) 140/62 (09/13 0338) SpO2:  [97 %-100 %] 97 % (09/13 0338)  Weight change:  Filed Weights   03/17/18 1123 03/17/18 1743  Weight: 78.5 kg 74.9 kg    Intake/Output: I/O last 3 completed shifts: In: 2749.2 [I.V.:2749.2] Out: 1350 [QDIYM:4158]   Intake/Output this shift:  No intake/output data recorded.  Physical Exam: General: No acute distress  Head: Normocephalic, atraumatic. Moist oral mucosal membranes  Eyes: Anicteric  Neck: Supple, trachea midline  Lungs:  Clear to auscultation, normal effort  Heart: S1S2 no rubs  Abdomen:  Soft, nontender, bowel sounds present  Extremities: No peripheral edema.  Neurologic: Awake, alert, following commands  Skin: No lesions    Basic Metabolic Panel: Recent Labs  Lab 03/17/18 1154 03/18/18 0405 03/19/18 0455 03/20/18 0307 03/21/18 0315  NA 130* 136 141 142 141  K 5.3* 4.6 4.4 4.0 3.9  CL 101 109 116* 115* 115*  CO2 12* 19* 20* 22 20*  GLUCOSE 286* 203* 158* 183* 203*  BUN 67* 58* 35* 21 13  CREATININE 5.20* 4.01* 2.44* 1.64* 1.49*  CALCIUM 9.8 9.3 9.5 9.3 9.0    Liver Function Tests: Recent Labs  Lab 03/17/18 1154  AST 44*  ALT 23  ALKPHOS 92  BILITOT 1.4*  PROT 8.2*  ALBUMIN 4.3   No results for input(s): LIPASE, AMYLASE in the last 168 hours. No results for input(s): AMMONIA in the last 168 hours.  CBC: Recent Labs  Lab 03/17/18 1154 03/18/18 0405  WBC 10.0 6.6  NEUTROABS 7.9*  --   HGB 12.4 10.7*  HCT 39.6 32.7*  MCV 105.9* 102.0*  PLT 193 170    Cardiac Enzymes: No results for input(s): CKTOTAL, CKMB, CKMBINDEX, TROPONINI in the last  168 hours.  BNP: Invalid input(s): POCBNP  CBG: Recent Labs  Lab 03/20/18 0948 03/20/18 1200 03/20/18 1319 03/20/18 1637 03/21/18 0756  GLUCAP 158* 171* 173* 135* 195*    Microbiology: Results for orders placed or performed during the hospital encounter of 02/20/18  CULTURE, BLOOD (ROUTINE X 2) w Reflex to ID Panel     Status: None   Collection Time: 02/23/18  7:05 PM  Result Value Ref Range Status   Specimen Description BLOOD LEFT WRIST  Final   Special Requests   Final    BOTTLES DRAWN AEROBIC AND ANAEROBIC Blood Culture results may not be optimal due to an inadequate volume of blood received in culture bottles   Culture   Final    NO GROWTH 5 DAYS Performed at [facility], [address] [place], [place] [address]    Report Status 02/28/2018 FINAL  Final  CULTURE, BLOOD (ROUTINE X 2) w Reflex to ID Panel     Status: None   Collection Time: 02/23/18  7:05 PM  Result Value Ref Range Status   Specimen Description BLOOD LEFT HAND  Final   Special Requests   Final    BOTTLES DRAWN AEROBIC AND ANAEROBIC Blood Culture adequate volume   Culture   Final    NO GROWTH 5 DAYS Performed at [facility], [address] [place], [place]  [address]    Report Status 02/28/2018 FINAL  Final    Coagulation Studies: No results for input(s): LABPROT, INR in the last 72 hours.  Urinalysis: No results for input(s): COLORURINE, LABSPEC, PHURINE, GLUCOSEU, HGBUR, BILIRUBINUR, KETONESUR, PROTEINUR, UROBILINOGEN, NITRITE, LEUKOCYTESUR in the last 72 hours.  Invalid input(s): APPERANCEUR    Imaging: Dg Thoracic Spine 2 View  Result Date: 03/20/2018 CLINICAL DATA:  Post kyphoplasty. EXAM: THORACIC SPINE 2 VIEWS; DG C-ARM 61-120 MIN COMPARISON:  Body CT 03/17/2018 FINDINGS: Fluoroscopic images of the thoraco lumbar junction demonstrate interval kyphoplasty of T12 vertebral body with improvement in the previously demonstrated height loss of the superior endplate. The  fluoroscopy time is recorded as 1 minutes 39 seconds IMPRESSION: Kyphoplasty of T12 vertebral body with improvement in the previously demonstrated height loss of the superior endplate. Electronically Signed   By: Fidela Salisbury M.D.   On: 03/20/2018 11:57   Dg C-arm 1-60 Min  Result Date: 03/20/2018 CLINICAL DATA:  Post kyphoplasty. EXAM: THORACIC SPINE 2 VIEWS; DG C-ARM 61-120 MIN COMPARISON:  Body CT 03/17/2018 FINDINGS: Fluoroscopic images of the thoraco lumbar junction demonstrate interval kyphoplasty of T12 vertebral body with improvement in the previously demonstrated height loss of the superior endplate. The fluoroscopy time is recorded as 1 minutes 39 seconds IMPRESSION: Kyphoplasty of T12 vertebral body with improvement in the previously demonstrated height loss of the superior endplate. Electronically Signed   By: Fidela Salisbury M.D.   On: 03/20/2018 11:57     Medications:   . sodium chloride 75 mL/hr at 03/21/18 0600   . aspirin EC  81 mg Oral Daily  . cholecalciferol  1,000 Units Oral Daily  . docusate sodium  100 mg Oral BID  . feeding supplement (ENSURE ENLIVE)  237 mL Oral BID BM  . Ferrous Fumarate  1 tablet Oral BID  . insulin aspart  0-9 Units Subcutaneous TID WC  . loratadine  10 mg Oral Daily  . pantoprazole  40 mg Oral Daily  . simvastatin  20 mg Oral QHS  . vitamin B-12  1,000 mcg Oral Daily   bisacodyl, docusate sodium, metoCLOPramide **OR** metoCLOPramide (REGLAN) injection, morphine injection, ondansetron **OR** ondansetron (ZOFRAN) IV, oxyCODONE-acetaminophen  Assessment/ Plan:  77 y.o. female with a PMHx of right breast cancer status post mastectomy, chronic kidney disease stage III, diabetes mellitus type 2, glaucoma, hyperlipidemia, hypertension, peripheral neuropathy, rheumatoid arthritis, left renal mass, who was admitted to [facility] on 03/17/2018 for evaluation of acute renal failure.   1.  Acute renal failure/chronic kidney disease stage III.  Acute  renal failure likely secondary to dehydration and poor by mouth intake as well as taking Lasix and lisinopril in a dehydrated state. 2.  Left renal mass suspicious for renal cell carcinoma. 3.  Anemia chronic kidney disease. 4.  T12 compression fracture.  Plan: Patient underwent T12 kyphoplasty yesterday and tolerated the procedure well.  Her acute renal failure has significantly improved as creatinine is down to 1.49.  We will go ahead and discontinue IV fluid today.  Patient will need follow-up of her left renal mass with urology as previously scheduled.  Otherwise disposition as per hospitalist.    LOS: 4 [place] Giammona 9/13/20199:47 AM

## 2018-03-21 NOTE — Addendum Note (Signed)
Addended by: Abbie Sons on: 03/21/2018 07:05 AM   Modules accepted: Orders

## 2018-03-21 NOTE — Progress Notes (Signed)
Patient discharge teaching given, including activity, diet, follow-up appoints, and medications. Patient verbalized understanding of all discharge instructions. IV access was d/c'd. Vitals are stable. Skin is intact except as charted in most recent assessments. Pt to be escorted out by volunteer, to be driven home by family.  Emily Faulkner [employer]

## 2018-03-21 NOTE — Care Management Note (Signed)
Case Management Note  Patient Details  Name: Emily Faulkner MRN: [id number] Date of Birth: 1940-10-16  Subjective/Objective:  Patient to be discharged per MD order. Orders in place for home health services. Given choice patient and family prefer [facility] for PT and RN services. Referral placed with Corene Cornea from [facility] who accepts the patient. Patient has a walker in the home and there are no other DME needs. Family to provide transport.                    Action/Plan:   Expected Discharge Date:  03/21/18               Expected Discharge Plan:  [place]  In-House Referral:     Discharge planning Services  CM Consult  Post Acute Care Choice:  Home Health Choice offered to:  Patient, Adult Children  DME Arranged:    DME Agency:     HH Arranged:  RN, PT [place] Agency:  [facility]  Status of Service:  Completed, signed off  If discussed at [place] of Stay Meetings, dates discussed:    Additional Comments:  Latanya Maudlin, RN 03/21/2018, 1:33 PM

## 2018-03-21 NOTE — Evaluation (Signed)
Physical Therapy Evaluation Patient Details Name: Emily Faulkner MRN: [id number] DOB: 1940-12-30 Today's Date: 03/21/2018   History of Present Illness  Pt is a 77 y.o female admitted for renal dysfunction and presenting status post T-12 kyphoplasty on 03/20/18 and PMH significant for breast cancer status post mastectomy, chronic kidney disease, diabetes, hyperlipidemia, hypertension, rheumatoid arthritis, and right renal mass.  Clinical Impression  Pt eager and ready to begin working with PT. Pt lives independently  in a 2 story home with 2 stairs to enter and utilizes no AD or physical assistance needed at baseline. Pt demonstrating increased lethargy and effort to perform basic bed mobility and transfers as compared to baseline. Ambulation initially with no AD showed swaying, decreased cadence, and guarding. With a walker pt was more steady, increased speed, and had no signs of instability. Pt maneuvered stairs with CGA required and evident difficulty with descent. Pt requiring significant cuing to descend stairs and required 2 UE support. Pt demonstrating safe ability to go home but discussed with family the need for intermittent supervision for general safety and DME use until she returns to baseline. About 10 minutes apart from exam spent on gait training, stair training, and DME training provided this date with verbal education provided to pt and family. Pt would benefit from HHPT to improve upon limitations listed above and progress towards baseline physical/cognitive status.     Follow Up Recommendations Home health PT;Supervision - Intermittent    Equipment Recommendations  Rolling walker with 5" wheels    Recommendations for Other Services       Precautions / Restrictions Precautions Precautions: Fall Restrictions Weight Bearing Restrictions: No      Mobility  Bed Mobility Overal bed mobility: Independent Bed Mobility: Supine to Sit;Sit to Supine     Supine to sit:  Independent Sit to supine: Independent      Transfers Overall transfer level: Modified independent Equipment used: Rolling walker (2 wheeled) Transfers: Sit to/from Stand Sit to Stand: Modified independent (Device/Increase time)         General transfer comment: slight lethargy with transfers   Ambulation/Gait Ambulation/Gait assistance: Modified independent (Device/Increase time) Gait Distance (Feet): 125 Feet Assistive device: Rolling walker (2 wheeled)       General Gait Details: 1st half trial without AD with guarding, unsteadiness, sway, and deviation; 2nd trial with RW demonstrated increased confidence, speed, and stability  Stairs Stairs: Yes Stairs assistance: Min guard Stair Management: One rail Right Number of Stairs: 3 General stair comments: slow speed, utilized step to strategy, no LOB, inability to decend with 1 UE required 2 UE, cuing required for descending stairs and ensuring safety  Wheelchair Mobility    Modified Rankin (Stroke Patients Only)       Balance Overall balance assessment: Needs assistance           Standing balance-Leahy Scale: Fair           Rhomberg - Eyes Opened: 64 Rhomberg - Eyes Closed: 15   High Level Balance Comments: good stability with rhomberg eyes open, decreased confidence with rhomberg eyes closed but steady             Pertinent Vitals/Pain Pain Assessment: (Pt with no obvious guarding or self limiting due to pain; no reports of pain at incision site,)    Home Living Family/patient expects to be discharged to:: Private residence Living Arrangements: Alone Available Help at Discharge: Family;Available PRN/intermittently Type of Home: House Home Access: Stairs to enter Entrance Stairs-Rails: Right Entrance Stairs-Number  of Steps: 2 Home Layout: Two level;Laundry or work area in basement;Able to live on main level with bedroom/bathroom Home Equipment: Environmental consultant - 2 wheels      Prior Function Level of  Independence: Independent         Comments: Independent with ADLs, houshold adn community ambulation, and driving     Hand Dominance   Dominant Hand: Right    Extremity/Trunk Assessment   Upper Extremity Assessment Upper Extremity Assessment: Overall WFL for tasks assessed    Lower Extremity Assessment Lower Extremity Assessment: Overall WFL for tasks assessed       Communication   Communication: No difficulties  Cognition Arousal/Alertness: Lethargic Behavior During Therapy: WFL for tasks assessed/performed Overall Cognitive Status: Impaired/Different from baseline                                 General Comments: slightly delayed responses according to family      General Comments      Exercises     Assessment/Plan    PT Assessment Patient needs continued PT services  PT Problem List Decreased strength;Decreased activity tolerance;Decreased balance;Decreased mobility;Decreased cognition;Decreased safety awareness       PT Treatment Interventions DME instruction;Gait training;Functional mobility training;Stair training;Therapeutic activities;Therapeutic exercise;Balance training;Patient/family education;Neuromuscular re-education;Cognitive remediation    PT Goals (Current goals can be found in the Care Plan section)  Acute Rehab PT Goals Patient Stated Goal: to go home PT Goal Formulation: With patient Time For Goal Achievement: 04/04/18 Potential to Achieve Goals: Good    Frequency 7X/week   Barriers to discharge        Co-evaluation               AM-PAC PT "6 Clicks" Daily Activity  Outcome Measure Difficulty turning over in bed (including adjusting bedclothes, sheets and blankets)?: A Little Difficulty moving from lying on back to sitting on the side of the bed? : A Little Difficulty sitting down on and standing up from a chair with arms (e.g., wheelchair, bedside commode, etc,.)?: A Little Help needed moving to and from a  bed to chair (including a wheelchair)?: A Little Help needed walking in hospital room?: A Little Help needed climbing 3-5 steps with a railing? : A Lot 6 Click Score: 17    End of Session Equipment Utilized During Treatment: Gait belt Activity Tolerance: Patient tolerated treatment well Patient left: with bed alarm set;with call bell/phone within reach   PT Visit Diagnosis: Difficulty in walking, not elsewhere classified (R26.2);Muscle weakness (generalized) (M62.81);Unsteadiness on feet (R26.81)    Time: 1388-7195 PT Time Calculation (min) (ACUTE ONLY): 25 min   Charges:              Ernie Avena, SPT 03/21/2018, 1:28 PM

## 2018-03-24 ENCOUNTER — Other Ambulatory Visit: Payer: Self-pay

## 2018-03-24 NOTE — Patient Outreach (Signed)
[facility]) Care Management  03/24/2018  Emily Faulkner July 28, 1940 [id number]   Referral Date:03/12/18 Referral Source: [employer] report Referral Reason: [employer] Report   Outreach Attempt:Spoke with patient.  She is able to verify HIPAA.  Discussed with patient reason for referral.  Patient states she feels great since surgery and feels like she is doing well.  She states that her physicians office has been in contact for transition of care call and she has an appointment on tomorrow.  Patient also states that she has appointments set up with her surgeon as well.  Asked patient if home health has made contact.  She states that they contacted her Saturday and will be calling to schedule a visit this week.  Patient reports that she went into the hospital on 03-17-18 for problems with her kidneys and was hydrated and had surgery for a compression fracture and was discharged on 03-21-18.  Per patient she has assistance from her family and denies any present needs.    Discussed [facility] services and patient declined need.     Plan: RN CM will close case at this time.     Jone Baseman, RN, MSN [facility] Care Management Care Management Coordinator Direct Line [phone] Toll Free: [phone]  Fax: [phone]

## 2018-03-25 DIAGNOSIS — S22000A Wedge compression fracture of unspecified thoracic vertebra, initial encounter for closed fracture: Secondary | ICD-10-CM | POA: Diagnosis not present

## 2018-03-25 DIAGNOSIS — N179 Acute kidney failure, unspecified: Secondary | ICD-10-CM | POA: Diagnosis not present

## 2018-03-25 DIAGNOSIS — I10 Essential (primary) hypertension: Secondary | ICD-10-CM | POA: Diagnosis not present

## 2018-03-25 DIAGNOSIS — N183 Chronic kidney disease, stage 3 (moderate): Secondary | ICD-10-CM | POA: Diagnosis not present

## 2018-03-25 DIAGNOSIS — N2889 Other specified disorders of kidney and ureter: Secondary | ICD-10-CM | POA: Diagnosis not present

## 2018-03-25 DIAGNOSIS — K7460 Unspecified cirrhosis of liver: Secondary | ICD-10-CM | POA: Diagnosis not present

## 2018-03-27 ENCOUNTER — Other Ambulatory Visit: Payer: Self-pay

## 2018-03-27 NOTE — Patient Outreach (Signed)
[facility]) Care Management  03/27/2018  Emily Faulkner 1941/03/11 [id number]   EMMI- General Discharge RED ON EMMI ALERT Day # 4 Date: 03/26/18 Red Alert Reason: Know who to call about changes in condition. No   Outreach attempt: Spoke with patient.  She is able to verify HIPAA.  Discussed with patient red alert.  Patient reports she believes she knows who to call for changes in condition.  Reviewed with patient who to call about which concerns.  She verbalized understanding. Patient states she declined therapy at home. She states she has plenty of help at home.  Patient has seen PCP on Tuesday and she states he felt she was doing good.  Patient verbalizes no questions or concerns.     Plan: RN CM will close case.  Jone Baseman, RN, MSN [facility] Care Management Care Management Coordinator Direct Line [phone] Toll Free: [phone]  Fax: [phone]

## 2018-03-30 NOTE — Progress Notes (Signed)
[place]  [phone] [phone]  ID: [name] OB: December 29, 1940  MR#: [id number]  ZYS#:[id number]  Patient Care Team: Sofie Hartigan, MD as PCP - General (Family Medicine)  CHIEF COMPLAINT: Iron deficiency anemia.  INTERVAL HISTORY: Patient returns to clinic today for repeat laboratory work, further evaluation, consideration of additional IV Feraheme.  She currently feels well and is asymptomatic.  She does not complain of weakness or fatigue today. She has no neurologic complaints.  She denies any recent fevers.  She has good appetite and denies weight loss.  She has no chest pain or shortness of breath.  She denies any nausea, vomiting, constipation, or diarrhea.  She has noted no further melena or hematochezia.  She has no urinary complaints.  Patient feels that her baseline offers no specific complaints today.  REVIEW OF SYSTEMS:   Review of Systems  Constitutional: Negative for fever, malaise/fatigue and weight loss.  HENT: Negative.  Negative for congestion.   Respiratory: Negative.  Negative for cough, hemoptysis and shortness of breath.   Cardiovascular: Negative.  Negative for chest pain and leg swelling.  Gastrointestinal: Negative for abdominal pain, blood in stool, constipation, diarrhea, nausea and vomiting.  Genitourinary: Negative.  Negative for dysuria, frequency and urgency.  Musculoskeletal: Negative.   Skin: Negative.  Negative for rash.  Neurological: Negative.  Negative for tingling, tremors, weakness and headaches.  Psychiatric/Behavioral: Negative.  Negative for depression. The patient is not nervous/anxious.    As per HPI. Otherwise, a complete review of systems is negative.  PAST MEDICAL HISTORY: Past Medical History:  Diagnosis Date  . Cancer [facility])    breast cancer right mastectomy  . Chronic kidney disease   . Diabetes mellitus without complication ([place])   . Glaucoma   . Hyperlipidemia   . Hypertension     . Neuropathy   . Osteoarthritis   . Rheumatoid arthritis ([place])   . Telangiectasias     PAST SURGICAL HISTORY: Past Surgical History:  Procedure Laterality Date  . BREAST SURGERY     right mastectomy  . CHOLECYSTECTOMY    . COLONOSCOPY    . COLONOSCOPY WITH PROPOFOL N/A 08/14/2016   Procedure: COLONOSCOPY WITH PROPOFOL;  Surgeon: Lollie Sails, MD;  Location: [facility] ENDOSCOPY;  Service: Endoscopy;  Laterality: N/A;  . COLONOSCOPY WITH PROPOFOL N/A 02/22/2018   Procedure: COLONOSCOPY WITH PROPOFOL;  Surgeon: Jonathon Bellows, MD;  Location: [facility] ENDOSCOPY;  Service: Gastroenterology;  Laterality: N/A;  . ESOPHAGOGASTRODUODENOSCOPY (EGD) WITH PROPOFOL N/A 08/14/2016   Procedure: ESOPHAGOGASTRODUODENOSCOPY (EGD) WITH PROPOFOL;  Surgeon: Lollie Sails, MD;  Location: [facility] ENDOSCOPY;  Service: Endoscopy;  Laterality: N/A;  . ESOPHAGOGASTRODUODENOSCOPY (EGD) WITH PROPOFOL N/A 01/30/2018   Procedure: ESOPHAGOGASTRODUODENOSCOPY (EGD) WITH PROPOFOL;  Surgeon: Toledo, Benay Pike, MD;  Location: [facility] ENDOSCOPY;  Service: Gastroenterology;  Laterality: N/A;  . ESOPHAGOGASTRODUODENOSCOPY (EGD) WITH PROPOFOL N/A 02/22/2018   Procedure: ESOPHAGOGASTRODUODENOSCOPY (EGD) WITH PROPOFOL;  Surgeon: Jonathon Bellows, MD;  Location: [facility] ENDOSCOPY;  Service: Gastroenterology;  Laterality: N/A;  . EXCISIONAL HEMORRHOIDECTOMY    . KYPHOPLASTY N/A 03/20/2018   Procedure: [id number];  Surgeon: Hessie Knows, MD;  Location: [facility] ORS;  Service: Orthopedics;  Laterality: N/A;    FAMILY HISTORY: Reviewed and unchanged. No reported history of malignancy or chronic disease.  ADVANCED DIRECTIVES (Y/N):  N  HEALTH MAINTENANCE: Social History   Tobacco Use  . Smoking status: Former Smoker    Last attempt to quit: 07/10/2011    Years since quitting: 6.7  . Smokeless tobacco:  Never Used  Substance Use Topics  . Alcohol use: No  . Drug use: No     Colonoscopy:  PAP:  Bone density:  Lipid panel:  No Known  Allergies  Current Outpatient Medications  Medication Sig Dispense Refill  . ACCU-CHEK AVIVA PLUS test strip     . aspirin 81 MG EC tablet Take 81 mg by mouth daily.     . BD AUTOSHIELD DUO 30G X 5 MM MISC     . BD PEN NEEDLE NANO U/F 32G X 4 MM MISC     . clindamycin (CLEOCIN T) 1 % external solution Apply 1 application topically 2 (two) times daily.  2  . clobetasol (TEMOVATE) 0.05 % external solution Apply 1 application topically every evening. (apply to scalp)    . Ergocalciferol (VITAMIN D2) 2000 units TABS Take 1 tablet by mouth daily.    . feeding supplement, ENSURE ENLIVE, (ENSURE ENLIVE) LIQD Take 237 mLs by mouth 2 (two) times daily between meals. 237 mL 12  . ferrous fumarate (HEMOCYTE - 106 MG FE) 325 (106 Fe) MG TABS tablet Take 1 tablet (106 mg of iron total) by mouth 2 (two) times daily. 60 each 0  . glipiZIDE (GLUCOTROL) 10 MG tablet Take 10 mg by mouth daily.     . Insulin Glargine (LANTUS SOLOSTAR) 100 UNIT/ML Solostar Pen Inject 36 Units into the skin daily. (may take up to 50u based upon blood glucose reading)    . Insulin Pen Needle (BD PEN NEEDLE NANO U/F) 32G X 4 MM MISC USE 1 NEEDLE SUBCUTANEOUSLY ONCE A DAY AS DIRECTED    . ketoconazole (NIZORAL) 2 % cream     . ketoconazole (NIZORAL) 2 % shampoo Apply 1 application topically daily. (apply to scalp and wait at least 30 minutes before rinsing)    . Lidocaine 4 % PTCH Place onto the skin.    Marland Kitchen loratadine (CLARITIN) 10 MG tablet Take 10 mg by mouth daily.    . magnesium oxide (MAG-OX) 400 MG tablet Take 800 mg by mouth daily.    . methocarbamol (ROBAXIN) 500 MG tablet Take 500-1,500 mg by mouth See admin instructions. On day 1-3 take 1500 mg by mouth three times daily, on days 4-6 take 1000 mg by mouth three times daily, then take 1000 mg by mouth three times daily as needed.  0  . niacin (NIASPAN) 500 MG CR tablet Take 500 mg by mouth every evening.  2  . Omega-3 Fatty Acids (FISH OIL) 1000 MG CAPS Take 1 capsule by  mouth.     . pantoprazole (PROTONIX) 40 MG tablet Take 1 tablet (40 mg total) by mouth daily. 30 tablet 0  . potassium chloride SA (K-DUR,KLOR-CON) 20 MEQ tablet Take 20 mEq by mouth 2 (two) times daily.    . simvastatin (ZOCOR) 20 MG tablet Take 20 mg by mouth at bedtime.  6  . sitaGLIPtin (JANUVIA) 50 MG tablet Take 50 mg by mouth daily.    . traMADol (ULTRAM) 50 MG tablet Take by mouth.    . TRAVATAN Z 0.004 % SOLN ophthalmic solution Place 1 drop into the left eye at bedtime.    . vitamin B-12 (CYANOCOBALAMIN) 1000 MCG tablet Take 1,000 mcg by mouth daily.    . hydrOXYzine (ATARAX/VISTARIL) 10 MG tablet Take 10 mg by mouth at bedtime.  1   No current facility-administered medications for this visit.     OBJECTIVE: Vitals:   04/04/18 1201  BP: 136/70  Pulse: 80  Resp: 18  Temp: (!) 97.5 F (36.4 C)     Body mass index is 28.34 kg/m.    ECOG FS:1 - Symptomatic but completely ambulatory  General: Well-developed, well-nourished, no acute distress. Eyes: Pink conjunctiva, anicteric sclera. HEENT: Normocephalic, moist mucous membranes. Lungs: Clear to auscultation bilaterally. Heart: Regular rate and rhythm. No rubs, murmurs, or gallops. Abdomen: Soft, nontender, nondistended. No organomegaly noted, normoactive bowel sounds. Musculoskeletal: No edema, cyanosis, or clubbing. Neuro: Alert, answering all questions appropriately. Cranial nerves grossly intact. Skin: No rashes or petechiae noted. Psych: Normal affect.   LAB RESULTS:  Lab Results  Component Value Date   NA 141 03/21/2018   K 3.9 03/21/2018   CL 115 (H) 03/21/2018   CO2 20 (L) 03/21/2018   GLUCOSE 203 (H) 03/21/2018   BUN 13 03/21/2018   CREATININE 1.49 (H) 03/21/2018   CALCIUM 9.0 03/21/2018   PROT 8.2 (H) 03/17/2018   ALBUMIN 4.3 03/17/2018   AST 44 (H) 03/17/2018   ALT 23 03/17/2018   ALKPHOS 92 03/17/2018   BILITOT 1.4 (H) 03/17/2018   GFRNONAA 33 (L) 03/21/2018   GFRAA 38 (L) 03/21/2018    Lab  Results  Component Value Date   WBC 4.9 04/04/2018   NEUTROABS 3.6 04/04/2018   HGB 11.6 (L) 04/04/2018   HCT 34.9 (L) 04/04/2018   MCV 100.4 (H) 04/04/2018   PLT 188 04/04/2018   Lab Results  Component Value Date   IRON 44 04/04/2018   TIBC 329 04/04/2018   IRONPCTSAT 13 04/04/2018   Lab Results  Component Value Date   FERRITIN 97 04/04/2018    STUDIES: Dg Thoracic Spine 2 View  Result Date: 03/20/2018 CLINICAL DATA:  Post kyphoplasty. EXAM: THORACIC SPINE 2 VIEWS; DG C-ARM 61-120 MIN COMPARISON:  Body CT 03/17/2018 FINDINGS: Fluoroscopic images of the thoraco lumbar junction demonstrate interval kyphoplasty of T12 vertebral body with improvement in the previously demonstrated height loss of the superior endplate. The fluoroscopy time is recorded as 1 minutes 39 seconds IMPRESSION: Kyphoplasty of T12 vertebral body with improvement in the previously demonstrated height loss of the superior endplate. Electronically Signed   By: Fidela Salisbury M.D.   On: 03/20/2018 11:57   Dg C-arm 1-60 Min  Result Date: 03/20/2018 CLINICAL DATA:  Post kyphoplasty. EXAM: THORACIC SPINE 2 VIEWS; DG C-ARM 61-120 MIN COMPARISON:  Body CT 03/17/2018 FINDINGS: Fluoroscopic images of the thoraco lumbar junction demonstrate interval kyphoplasty of T12 vertebral body with improvement in the previously demonstrated height loss of the superior endplate. The fluoroscopy time is recorded as 1 minutes 39 seconds IMPRESSION: Kyphoplasty of T12 vertebral body with improvement in the previously demonstrated height loss of the superior endplate. Electronically Signed   By: Fidela Salisbury M.D.   On: 03/20/2018 11:57   Ct Renal Stone Study  Result Date: 03/17/2018 CLINICAL DATA:  Low back pain and nausea for the past 2 months. EXAM: CT ABDOMEN AND PELVIS WITHOUT CONTRAST TECHNIQUE: Multidetector CT imaging of the abdomen and pelvis was performed following the standard protocol without IV contrast. COMPARISON:   Chest x-ray dated January 29, 2018. CT abdomen dated January 30, 2017. FINDINGS: Lower chest: No acute abnormality. Unchanged mild subpleural reticulation at both lung bases. Hepatobiliary: Unchanged cirrhosis. No focal liver abnormality. Status post cholecystectomy. No biliary dilatation. Pancreas: Unremarkable. No pancreatic ductal dilatation or surrounding inflammatory changes. Spleen: Stable mild splenomegaly.  No focal abnormality. Adrenals/Urinary Tract: The adrenal glands and right kidney are unremarkable. Grossly unchanged 18 mm exophytic lesion arising from  the upper pole of the left kidney. No renal or ureteral calculi. No hydronephrosis. The bladder is unremarkable. Stomach/Bowel: Stomach is within normal limits. Appendix appears normal. No evidence of bowel wall thickening, distention, or inflammatory changes. Mild colonic diverticulosis. Vascular/Lymphatic: Aortic atherosclerosis. Unchanged recanalized umbilical vein. No enlarged abdominal or pelvic lymph nodes. Reproductive: Uterus and bilateral adnexa are unremarkable. Other: No free fluid or pneumoperitoneum. Musculoskeletal: New subacute T12 compression fracture with 50% height loss and 5 mm retropulsion. Unchanged severe degenerative disc disease at L2-L3. IMPRESSION: 1. New subacute T12 compression fracture with 50% height loss and 5 mm retropulsion. 2. Grossly unchanged exophytic left renal lesion, concerning for renal neoplasm. 3. Unchanged cirrhosis and sequelae of portal hypertension mild splenomegaly. 4.  Aortic atherosclerosis (ICD10-I70.0). Electronically Signed   By: Titus Dubin M.D.   On: 03/17/2018 12:38    ASSESSMENT: Iron deficiency anemia.  PLAN:    1. Iron deficiency anemia: Secondary to recent GI bleed.  Patient had upper endoscopy on January 30, 2018.  She had a small bowel endoscopy and colonoscopy on February 22, 2018 that revealed multiple ulcerations that were subsequently treated with argon laser.  She denies any further  melena or hematochezia.  Her hemoglobin has trended down slightly to 11.6, but is essentially stable.  Iron stores continue to be within normal limits.  No intervention is needed at this time.  Patient does not require IV Feraheme.  Return to clinic in 3 months with repeat laboratory work and further evaluation. 2. Left kidney mass: Continue follow-up with urology as indicated.  I spent a total of 20 minutes face-to-face with the patient of which greater than 50% of the visit was spent in counseling and coordination of care as detailed above.   Patient expressed understanding and was in agreement with this plan. She also understands that She can call clinic at any time with any questions, concerns, or complaints.    Lloyd Huger, MD 04/07/18 6:08 PM

## 2018-03-31 ENCOUNTER — Other Ambulatory Visit: Payer: Self-pay | Admitting: *Deleted

## 2018-03-31 DIAGNOSIS — N183 Chronic kidney disease, stage 3 (moderate): Secondary | ICD-10-CM | POA: Diagnosis not present

## 2018-03-31 DIAGNOSIS — N179 Acute kidney failure, unspecified: Secondary | ICD-10-CM | POA: Diagnosis not present

## 2018-03-31 DIAGNOSIS — D649 Anemia, unspecified: Secondary | ICD-10-CM

## 2018-03-31 DIAGNOSIS — N2889 Other specified disorders of kidney and ureter: Secondary | ICD-10-CM | POA: Diagnosis not present

## 2018-03-31 NOTE — Progress Notes (Signed)
cb

## 2018-04-02 ENCOUNTER — Encounter: Payer: Self-pay | Admitting: Gastroenterology

## 2018-04-02 ENCOUNTER — Ambulatory Visit (INDEPENDENT_AMBULATORY_CARE_PROVIDER_SITE_OTHER): Payer: Medicare HMO | Admitting: Gastroenterology

## 2018-04-02 ENCOUNTER — Other Ambulatory Visit: Payer: Self-pay

## 2018-04-02 VITALS — BP 121/69 | HR 99 | Resp 18 | Ht 64.0 in | Wt 165.4 lb

## 2018-04-02 DIAGNOSIS — K5520 Angiodysplasia of colon without hemorrhage: Secondary | ICD-10-CM | POA: Diagnosis not present

## 2018-04-02 DIAGNOSIS — D500 Iron deficiency anemia secondary to blood loss (chronic): Secondary | ICD-10-CM

## 2018-04-02 DIAGNOSIS — K5521 Angiodysplasia of colon with hemorrhage: Secondary | ICD-10-CM | POA: Diagnosis not present

## 2018-04-02 NOTE — Progress Notes (Signed)
[name], MD [address]  [place], [place] [address]  Main: [phone]  Fax: [phone]    Gastroenterology Consultation  Referring Provider:     Sofie Hartigan, MD Primary Care Physician:  Sofie Hartigan, MD Primary Gastroenterologist:  Dr. Gustavo Lah Reason for Consultation:     Chronic iron deficiency anemia, AVMs        HPI:   Emily Faulkner is a 77 y.o. female referred by Dr. Ellison Hughs [name], MD  for consultation & management of chronic iron deficiency anemia.  Patient is functionally independent, has metabolic syndrome, with chronic iron deficiency anemia, on oral iron therapy, diabetes, hypertension, hyperlipidemia, cirrhosis of liver, well compensated who was admitted to [facility] on 02/21/2018 secondary to severe symptomatic anemia, hemoglobin 6.3 and melena.  She was also taking oral iron.  During this admission, patient underwent push enteroscopy as well as colonoscopy which revealed small bowel AVMs as well as several AVMs in the ascending colon and cecum which were all treated with APC.  Patient was discharged home on oral iron therapy  She was also admitted in last week of July secondary to severe symptomatic anemia, underwent EGD which was unremarkable except for mild portal hypertensive gastropathy. Patient previously underwent EGD and colonoscopy in 08/2016, revealed nonbleeding AVMs in the ascending colon, left untreated. She never had a video capsule endoscopy performed.  Interval summary: Patient reports doing well since discharge.  She continues to be on oral iron.  She reports that her stools interchange between yellow, brown and black.  She has a follow-up appointment with Dr. Grayland Ormond on Friday this week, also scheduled for pre-clinic labs.  She is accompanied by her son today.  She denies any other GI symptoms.   NSAIDs: None  Antiplts/Anticoagulants/Anti thrombotics: Aspirin 81  GI Procedures:  GI Procedures:  EGD 01/30/2018  by Dr. Alice Reichert - Widely patent and non-obstructing Schatzki ring. - Portal hypertensive gastropathy. - Normal examined duodenum. - The examination was otherwise normal. - No specimens collected.  EGD and colonoscopy 08/2016 by Dr. Gustavo Lah for iron deficiency anemia AVMs, nonbleeding in ascending colon were detected, not treated DIAGNOSIS:  A. STOMACH, ANTRUM AND BODY; COLD BIOPSY:  - ANTRAL AND OXYNTIC MUCOSA WITH MODERATE CHRONIC ACTIVE GASTRITIS,  HELICOBACTER PYLORI ASSOCIATED.  - NEGATIVE FOR DYSPLASIA AND MALIGNANCY.   B. GE JUNCTION; COLD BIOPSY:  - REFLUX GASTROESOPHAGITIS.  - NEGATIVE FOR GOBLET CELLS, DYSPLASIA AND MALIGNANCY.   C. COLON, RANDOM RIGHT; COLD BIOPSY:  - NO PATHOLOGIC CHANGE.   D. COLON, RANDOM LEFT; COLD BIOPSY:  - NO PATHOLOGIC CHANGE.   E. RECTUM POLYP; COLD BIOPSY:  - POLYPOID SQUAMOCOLUMNAR (ANORECTAL) MUCOSA.  - PROMINENT LYMPHOID AGGREGATE.  - NEGATIVE FOR DYSPLASIA AND MALIGNANCY.  - MULTIPLE DEEPER LEVELS WERE EXAMINED.   Past Medical History:  Diagnosis Date  . Cancer [facility])    breast cancer right mastectomy  . Chronic kidney disease   . Diabetes mellitus without complication ([place])   . Glaucoma   . Hyperlipidemia   . Hypertension   . Neuropathy   . Osteoarthritis   . Rheumatoid arthritis ([place])   . Telangiectasias     Past Surgical History:  Procedure Laterality Date  . BREAST SURGERY     right mastectomy  . CHOLECYSTECTOMY    . COLONOSCOPY    . COLONOSCOPY WITH PROPOFOL N/A 08/14/2016   Procedure: COLONOSCOPY WITH PROPOFOL;  Surgeon: Lollie Sails, MD;  Location: [facility] ENDOSCOPY;  Service: Endoscopy;  Laterality:  N/A;  . COLONOSCOPY WITH PROPOFOL N/A 02/22/2018   Procedure: COLONOSCOPY WITH PROPOFOL;  Surgeon: Jonathon Bellows, MD;  Location: [facility] ENDOSCOPY;  Service: Gastroenterology;  Laterality: N/A;  . ESOPHAGOGASTRODUODENOSCOPY (EGD) WITH PROPOFOL N/A 08/14/2016   Procedure: ESOPHAGOGASTRODUODENOSCOPY (EGD) WITH PROPOFOL;   Surgeon: Lollie Sails, MD;  Location[facility] ENDOSCOPY;  Service: Endoscopy;  Laterality: N/A;  . ESOPHAGOGASTRODUODENOSCOPY (EGD) WITH PROPOFOL N/A 01/30/2018   Procedure: ESOPHAGOGASTRODUODENOSCOPY (EGD) WITH PROPOFOL;  Surgeon: Toledo, Benay Pike, MD;  Location: [facility] ENDOSCOPY;  Service: Gastroenterology;  Laterality: N/A;  . ESOPHAGOGASTRODUODENOSCOPY (EGD) WITH PROPOFOL N/A 02/22/2018   Procedure: ESOPHAGOGASTRODUODENOSCOPY (EGD) WITH PROPOFOL;  Surgeon: Jonathon Bellows, MD;  Location: [facility] ENDOSCOPY;  Service: Gastroenterology;  Laterality: N/A;  . EXCISIONAL HEMORRHOIDECTOMY    . KYPHOPLASTY N/A 03/20/2018   Procedure: [id number];  Surgeon: Hessie Knows, MD;  Location: [facility] ORS;  Service: Orthopedics;  Laterality: N/A;    Current Outpatient Medications:  .  ACCU-CHEK AVIVA PLUS test strip, , Disp: , Rfl:  .  aspirin 81 MG EC tablet, Take 81 mg by mouth daily. , Disp: , Rfl:  .  BD AUTOSHIELD DUO 30G X 5 MM MISC, , Disp: , Rfl:  .  BD PEN NEEDLE NANO U/F 32G X 4 MM MISC, , Disp: , Rfl:  .  clindamycin (CLEOCIN T) 1 % external solution, Apply 1 application topically 2 (two) times daily., Disp: , Rfl: 2 .  clobetasol (TEMOVATE) 0.05 % external solution, Apply 1 application topically every evening. (apply to scalp), Disp: , Rfl:  .  Ergocalciferol (VITAMIN D2) 2000 units TABS, Take 1 tablet by mouth daily., Disp: , Rfl:  .  feeding supplement, ENSURE ENLIVE, (ENSURE ENLIVE) LIQD, Take 237 mLs by mouth 2 (two) times daily between meals., Disp: 237 mL, Rfl: 12 .  ferrous fumarate (HEMOCYTE - 106 MG FE) 325 (106 Fe) MG TABS tablet, Take 1 tablet (106 mg of iron total) by mouth 2 (two) times daily., Disp: 60 each, Rfl: 0 .  glipiZIDE (GLUCOTROL) 10 MG tablet, Take 10 mg by mouth daily. , Disp: , Rfl:  .  hydrOXYzine (ATARAX/VISTARIL) 10 MG tablet, Take 10 mg by mouth at bedtime., Disp: , Rfl: 1 .  Insulin Glargine (LANTUS SOLOSTAR) 100 UNIT/ML Solostar Pen, Inject 36 Units into the skin daily.  (may take up to 50u based upon blood glucose reading), Disp: , Rfl:  .  Insulin Pen Needle (BD PEN NEEDLE NANO U/F) 32G X 4 MM MISC, USE 1 NEEDLE SUBCUTANEOUSLY ONCE A DAY AS DIRECTED, Disp: , Rfl:  .  ketoconazole (NIZORAL) 2 % cream, , Disp: , Rfl:  .  ketoconazole (NIZORAL) 2 % shampoo, Apply 1 application topically daily. (apply to scalp and wait at least 30 minutes before rinsing), Disp: , Rfl:  .  magnesium oxide (MAG-OX) 400 MG tablet, Take 800 mg by mouth daily., Disp: , Rfl:  .  niacin (NIASPAN) 500 MG CR tablet, Take 500 mg by mouth every evening., Disp: , Rfl: 2 .  Omega-3 Fatty Acids (FISH OIL) 1000 MG CAPS, Take 1 capsule by mouth. , Disp: , Rfl:  .  pantoprazole (PROTONIX) 40 MG tablet, Take 1 tablet (40 mg total) by mouth daily., Disp: 30 tablet, Rfl: 0 .  potassium chloride SA (K-DUR,KLOR-CON) 20 MEQ tablet, Take 20 mEq by mouth 2 (two) times daily., Disp: , Rfl:  .  simvastatin (ZOCOR) 20 MG tablet, Take 20 mg by mouth at bedtime., Disp: , Rfl: 6 .  sitaGLIPtin (JANUVIA) 50 MG tablet,  Take 50 mg by mouth daily., Disp: , Rfl:  .  traMADol (ULTRAM) 50 MG tablet, Take by mouth., Disp: , Rfl:  .  TRAVATAN Z 0.004 % SOLN ophthalmic solution, Place 1 drop into the left eye at bedtime., Disp: , Rfl:  .  vitamin B-12 (CYANOCOBALAMIN) 1000 MCG tablet, Take 1,000 mcg by mouth daily., Disp: , Rfl:  .  Lidocaine 4 % PTCH, Place onto the skin., Disp: , Rfl:  .  loratadine (CLARITIN) 10 MG tablet, Take 10 mg by mouth daily., Disp: , Rfl:  .  methocarbamol (ROBAXIN) 500 MG tablet, Take 500-1,500 mg by mouth See admin instructions. On day 1-3 take 1500 mg by mouth three times daily, on days 4-6 take 1000 mg by mouth three times daily, then take 1000 mg by mouth three times daily as needed., Disp: , Rfl: 0   Family History  Problem Relation Age of Onset  . Kidney cancer Son   . Bladder Cancer Neg Hx      Social History   Tobacco Use  . Smoking status: Former Smoker    Last attempt to  quit: 07/10/2011    Years since quitting: 6.7  . Smokeless tobacco: Never Used  Substance Use Topics  . Alcohol use: No  . Drug use: No    Allergies as of 04/02/2018  . (No Known Allergies)    Review of Systems:    All systems reviewed and negative except where noted in HPI.   Physical Exam:  BP 121/69 (BP Location: Left Arm, Patient Position: Sitting, Cuff Size: Normal)   Pulse 99   Resp 18   Ht 5' 4"  (1.626 m)   Wt 165 lb 6.4 oz (75 kg)   BMI 28.39 kg/m  No LMP recorded. Patient is postmenopausal.  General:   Alert,  Well-developed, well-nourished, pleasant and cooperative in NAD Head:  Normocephalic and atraumatic. Eyes:  Sclera clear, no icterus.   Conjunctiva pink. Ears:  Normal auditory acuity. Nose:  No deformity, discharge, or lesions. Mouth:  No deformity or lesions,oropharynx pink & moist. Neck:  Supple; no masses or thyromegaly. Lungs:  Respirations even and unlabored.  Clear throughout to auscultation.   No wheezes, crackles, or rhonchi. No acute distress. Heart:  Regular rate and rhythm; no murmurs, clicks, rubs, or gallops. Abdomen:  Normal bowel sounds. Soft, non-tender and non-distended without masses, hepatosplenomegaly or hernias noted.  No guarding or rebound tenderness.   Rectal: Not performed Msk:  Symmetrical without gross deformities. Good, equal movement & strength bilaterally. Pulses:  Normal pulses noted. Extremities:  No clubbing or edema.  No cyanosis. Neurologic:  Alert and oriented x3;  grossly normal neurologically. Skin:  Intact without significant lesions or rashes. No jaundice. Lymph Nodes:  No significant cervical adenopathy. Psych:  Alert and cooperative. Normal mood and affect.  Imaging Studies: Reviewed  Assessment and Plan:   CRICKETT Faulkner is a 77 y.o. Caucasian female with metabolic syndrome, well compensated cirrhosis, probably NASH in etiology, chronic iron deficiency anemia secondary to chronic blood loss from small bowel and  colonic AVMs.  She underwent EGD, which endoscopy and colonoscopy, AVMs that were detected were cauterized  -Follow-up labs to be done this week at Dr. Gary Fleet office -Most recent ferritin levels were normal as of 03/07/2018 -No evidence of B12 or folate deficiency -I have discussed with her about video capsule endoscopy and possible balloon enteroscopy if there are more small bowel AVMs, however, patient would like to wait for 4 weeks before  undergoing this test as she seemed to be overwhelmed with several follow-up visits. -Continue oral iron for now  Follow up in 1 month   [name], MD

## 2018-04-04 ENCOUNTER — Inpatient Hospital Stay: Payer: Medicare HMO | Attending: Oncology | Admitting: Oncology

## 2018-04-04 ENCOUNTER — Encounter: Payer: Self-pay | Admitting: Oncology

## 2018-04-04 ENCOUNTER — Inpatient Hospital Stay: Payer: Medicare HMO

## 2018-04-04 VITALS — BP 136/70 | HR 80 | Temp 97.5°F | Resp 18 | Wt 165.1 lb

## 2018-04-04 DIAGNOSIS — N189 Chronic kidney disease, unspecified: Secondary | ICD-10-CM

## 2018-04-04 DIAGNOSIS — I129 Hypertensive chronic kidney disease with stage 1 through stage 4 chronic kidney disease, or unspecified chronic kidney disease: Secondary | ICD-10-CM

## 2018-04-04 DIAGNOSIS — Z9889 Other specified postprocedural states: Secondary | ICD-10-CM | POA: Diagnosis not present

## 2018-04-04 DIAGNOSIS — Z87891 Personal history of nicotine dependence: Secondary | ICD-10-CM | POA: Diagnosis not present

## 2018-04-04 DIAGNOSIS — D509 Iron deficiency anemia, unspecified: Secondary | ICD-10-CM | POA: Diagnosis not present

## 2018-04-04 DIAGNOSIS — Z79899 Other long term (current) drug therapy: Secondary | ICD-10-CM | POA: Diagnosis not present

## 2018-04-04 DIAGNOSIS — Z7982 Long term (current) use of aspirin: Secondary | ICD-10-CM | POA: Diagnosis not present

## 2018-04-04 DIAGNOSIS — E1122 Type 2 diabetes mellitus with diabetic chronic kidney disease: Secondary | ICD-10-CM

## 2018-04-04 DIAGNOSIS — D649 Anemia, unspecified: Secondary | ICD-10-CM

## 2018-04-04 DIAGNOSIS — D508 Other iron deficiency anemias: Secondary | ICD-10-CM

## 2018-04-04 LAB — CBC WITH DIFFERENTIAL/PLATELET
BASOS ABS: 0 10*3/uL (ref 0–0.1)
BASOS PCT: 1 %
Eosinophils Absolute: 0.2 10*3/uL (ref 0–0.7)
Eosinophils Relative: 4 %
HCT: 34.9 % — ABNORMAL LOW (ref 35.0–47.0)
Hemoglobin: 11.6 g/dL — ABNORMAL LOW (ref 12.0–16.0)
Lymphocytes Relative: 14 %
Lymphs Abs: 0.7 10*3/uL — ABNORMAL LOW (ref 1.0–3.6)
MCH: 33.3 pg (ref 26.0–34.0)
MCHC: 33.1 g/dL (ref 32.0–36.0)
MCV: 100.4 fL — ABNORMAL HIGH (ref 80.0–100.0)
Monocytes Absolute: 0.4 10*3/uL (ref 0.2–0.9)
Monocytes Relative: 7 %
NEUTROS ABS: 3.6 10*3/uL (ref 1.4–6.5)
NEUTROS PCT: 74 %
PLATELETS: 188 10*3/uL (ref 150–440)
RBC: 3.47 MIL/uL — ABNORMAL LOW (ref 3.80–5.20)
RDW: 16.6 % — AB (ref 11.5–14.5)
WBC: 4.9 10*3/uL (ref 3.6–11.0)

## 2018-04-04 LAB — IRON AND TIBC
IRON: 44 ug/dL (ref 28–170)
Saturation Ratios: 13 % (ref 10.4–31.8)
TIBC: 329 ug/dL (ref 250–450)
UIBC: 285 ug/dL

## 2018-04-04 LAB — FERRITIN: FERRITIN: 97 ng/mL (ref 11–307)

## 2018-04-04 NOTE — Progress Notes (Signed)
Reports "feeling much better than I was a few weeks ago".

## 2018-04-10 DIAGNOSIS — M810 Age-related osteoporosis without current pathological fracture: Secondary | ICD-10-CM | POA: Diagnosis not present

## 2018-04-18 ENCOUNTER — Ambulatory Visit: Payer: Medicare HMO | Admitting: Urology

## 2018-05-04 ENCOUNTER — Emergency Department
Admission: EM | Admit: 2018-05-04 | Discharge: 2018-05-04 | Disposition: A | Discharge status: Home / Self Care | Payer: Medicare HMO | Attending: Emergency Medicine | Admitting: Emergency Medicine

## 2018-05-04 ENCOUNTER — Other Ambulatory Visit: Payer: Self-pay

## 2018-05-04 ENCOUNTER — Encounter: Payer: Self-pay | Admitting: Emergency Medicine

## 2018-05-04 ENCOUNTER — Emergency Department: Payer: Medicare HMO

## 2018-05-04 DIAGNOSIS — M10071 Idiopathic gout, right ankle and foot: Secondary | ICD-10-CM

## 2018-05-04 DIAGNOSIS — Z7982 Long term (current) use of aspirin: Secondary | ICD-10-CM | POA: Insufficient documentation

## 2018-05-04 DIAGNOSIS — Z79899 Other long term (current) drug therapy: Secondary | ICD-10-CM | POA: Diagnosis not present

## 2018-05-04 DIAGNOSIS — E1140 Type 2 diabetes mellitus with diabetic neuropathy, unspecified: Secondary | ICD-10-CM | POA: Insufficient documentation

## 2018-05-04 DIAGNOSIS — E7800 Pure hypercholesterolemia, unspecified: Secondary | ICD-10-CM | POA: Insufficient documentation

## 2018-05-04 DIAGNOSIS — Z853 Personal history of malignant neoplasm of breast: Secondary | ICD-10-CM | POA: Insufficient documentation

## 2018-05-04 DIAGNOSIS — M79671 Pain in right foot: Secondary | ICD-10-CM | POA: Diagnosis not present

## 2018-05-04 DIAGNOSIS — Z794 Long term (current) use of insulin: Secondary | ICD-10-CM | POA: Diagnosis not present

## 2018-05-04 DIAGNOSIS — I10 Essential (primary) hypertension: Secondary | ICD-10-CM | POA: Insufficient documentation

## 2018-05-04 DIAGNOSIS — Z87891 Personal history of nicotine dependence: Secondary | ICD-10-CM | POA: Diagnosis not present

## 2018-05-04 DIAGNOSIS — E785 Hyperlipidemia, unspecified: Secondary | ICD-10-CM | POA: Insufficient documentation

## 2018-05-04 DIAGNOSIS — M7989 Other specified soft tissue disorders: Secondary | ICD-10-CM | POA: Diagnosis not present

## 2018-05-04 LAB — CBC WITH DIFFERENTIAL/PLATELET
ABS IMMATURE GRANULOCYTES: 0.04 10*3/uL (ref 0.00–0.07)
BASOS PCT: 1 %
Basophils Absolute: 0 10*3/uL (ref 0.0–0.1)
EOS ABS: 0.3 10*3/uL (ref 0.0–0.5)
Eosinophils Relative: 3 %
HEMATOCRIT: 34.7 % — AB (ref 36.0–46.0)
Hemoglobin: 11 g/dL — ABNORMAL LOW (ref 12.0–15.0)
IMMATURE GRANULOCYTES: 1 %
LYMPHS ABS: 1.6 10*3/uL (ref 0.7–4.0)
Lymphocytes Relative: 18 %
MCH: 31.8 pg (ref 26.0–34.0)
MCHC: 31.7 g/dL (ref 30.0–36.0)
MCV: 100.3 fL — AB (ref 80.0–100.0)
MONOS PCT: 8 %
Monocytes Absolute: 0.7 10*3/uL (ref 0.1–1.0)
Neutro Abs: 6 10*3/uL (ref 1.7–7.7)
Neutrophils Relative %: 69 %
PLATELETS: 220 10*3/uL (ref 150–400)
RBC: 3.46 MIL/uL — ABNORMAL LOW (ref 3.87–5.11)
RDW: 14.3 % (ref 11.5–15.5)
WBC: 8.6 10*3/uL (ref 4.0–10.5)
nRBC: 0 % (ref 0.0–0.2)

## 2018-05-04 LAB — BASIC METABOLIC PANEL
ANION GAP: 10 (ref 5–15)
BUN: 16 mg/dL (ref 8–23)
CO2: 26 mmol/L (ref 22–32)
Calcium: 9.4 mg/dL (ref 8.9–10.3)
Chloride: 102 mmol/L (ref 98–111)
Creatinine, Ser: 1.26 mg/dL — ABNORMAL HIGH (ref 0.44–1.00)
GFR calc Af Amer: 46 mL/min — ABNORMAL LOW (ref >60)
GFR, EST NON AFRICAN AMERICAN: 40 mL/min — AB (ref >60)
GLUCOSE: 76 mg/dL (ref 70–99)
POTASSIUM: 5 mmol/L (ref 3.5–5.1)
Sodium: 138 mmol/L (ref 135–145)

## 2018-05-04 LAB — URIC ACID: Uric Acid, Serum: 8.5 mg/dL — ABNORMAL HIGH (ref 2.5–7.1)

## 2018-05-04 MED ORDER — COLCHICINE 0.6 MG PO TABS
1.2000 mg | ORAL_TABLET | Freq: Once | ORAL | Status: AC
  Administered 2018-05-04: 1.2 mg via ORAL
  Filled 2018-05-04: qty 2

## 2018-05-04 MED ORDER — TRAMADOL HCL 50 MG PO TABS: 50.0000 mg | ORAL_TABLET | Freq: Two times a day (BID) | ORAL | 0 refills | Status: DC

## 2018-05-04 MED ORDER — TRAMADOL HCL 50 MG PO TABS
50.0000 mg | ORAL_TABLET | Freq: Once | ORAL | Status: AC
  Administered 2018-05-04: 50 mg via ORAL
  Filled 2018-05-04: qty 1

## 2018-05-04 MED ORDER — COLCHICINE 0.6 MG PO TABS: ORAL_TABLET | ORAL | 0 refills | Status: DC

## 2018-05-04 NOTE — ED Triage Notes (Signed)
Pt to ED via POV, pt states that during the night she started having pain and swelling in her right foot. Pt denies injury. No wounds noted on foot. Pt does have non-pitting edema in right foot with some redness noted. Strong pulse present. Foot is warm to touch. Pt is in NAD at this time.

## 2018-05-04 NOTE — Discharge Instructions (Addendum)
Your uric acid level was elevated at 8.7 today. This is consistent with an acute gout flare. Take the prescription meds as directed. You may take OTC Tylenol as needed for additional pain relief. Follow-up with your provider for ongoing symptoms.

## 2018-05-04 NOTE — ED Provider Notes (Signed)
[facility] Emergency Department Provider Note ____________________________________________  Time seen: 38  I have reviewed the triage vital signs and the nursing notes.  HISTORY  Chief Complaint  Foot Pain  HPI Emily Faulkner is a 77 y.o. female presents to the ED accompanied by her adult son, for evaluation of sudden tenderness and redness to the right foot.  Patient describes 2 days ago she began to notice redness and tenderness to the dorsal and medial aspect of the right foot.  Ports her symptoms increased sharply overnight, noting exquisite tenderness and swelling over the dorsal foot.  She denies any preceding injury, accident, or trauma.  She also denies any weeping, wounds, or abrasions.  Patient describes the foot is warm to touch, and initially symptoms were in the left foot for about a day before it resolved.  Denies any fevers, chills, or sweats.  Past Medical History:  Diagnosis Date  . Cancer [facility])    breast cancer right mastectomy  . Chronic kidney disease   . Diabetes mellitus without complication ([place])   . Glaucoma   . Hyperlipidemia   . Hypertension   . Neuropathy   . Osteoarthritis   . Rheumatoid arthritis ([place])   . Telangiectasias     Patient Active Problem List   Diagnosis Date Noted  . Acute on chronic renal failure ([place]) 03/17/2018  . Renal mass 03/17/2018  . GIB (gastrointestinal bleeding) 02/20/2018  . Microalbuminuric diabetic nephropathy ([place]) 11/02/2016  . Diabetes mellitus, type 2 ([place]) 11/02/2016  . Diabetic neuropathy, type II diabetes mellitus ([place]) 11/02/2016  . Pulmonary hypertension ([place]) 09/11/2016  . Shortness of breath 09/06/2016  . Hypertension 09/06/2016  . Hyperkalemia 09/06/2016  . High cholesterol 09/06/2016  . Anemia 09/06/2016  . Diabetes mellitus ([place]) 09/06/2016  . Iron deficiency anemia 04/15/2016  . Type 2 diabetes mellitus with neurological manifestations, uncontrolled ([place]) 08/04/2014  .  Hyperlipidemia, unspecified 01/22/2014  . Personal history of breast cancer 07/17/2012    Past Surgical History:  Procedure Laterality Date  . BREAST SURGERY     right mastectomy  . CHOLECYSTECTOMY    . COLONOSCOPY    . COLONOSCOPY WITH PROPOFOL N/A 08/14/2016   Procedure: COLONOSCOPY WITH PROPOFOL;  Surgeon: Lollie Sails, MD;  Location: [facility] ENDOSCOPY;  Service: Endoscopy;  Laterality: N/A;  . COLONOSCOPY WITH PROPOFOL N/A 02/22/2018   Procedure: COLONOSCOPY WITH PROPOFOL;  Surgeon: Jonathon Bellows, MD;  Location: [facility] ENDOSCOPY;  Service: Gastroenterology;  Laterality: N/A;  . ESOPHAGOGASTRODUODENOSCOPY (EGD) WITH PROPOFOL N/A 08/14/2016   Procedure: ESOPHAGOGASTRODUODENOSCOPY (EGD) WITH PROPOFOL;  Surgeon: Lollie Sails, MD;  Location: [facility] ENDOSCOPY;  Service: Endoscopy;  Laterality: N/A;  . ESOPHAGOGASTRODUODENOSCOPY (EGD) WITH PROPOFOL N/A 01/30/2018   Procedure: ESOPHAGOGASTRODUODENOSCOPY (EGD) WITH PROPOFOL;  Surgeon: Toledo, Benay Pike, MD;  Location: [facility] ENDOSCOPY;  Service: Gastroenterology;  Laterality: N/A;  . ESOPHAGOGASTRODUODENOSCOPY (EGD) WITH PROPOFOL N/A 02/22/2018   Procedure: ESOPHAGOGASTRODUODENOSCOPY (EGD) WITH PROPOFOL;  Surgeon: Jonathon Bellows, MD;  Location: [facility] ENDOSCOPY;  Service: Gastroenterology;  Laterality: N/A;  . EXCISIONAL HEMORRHOIDECTOMY    . KYPHOPLASTY N/A 03/20/2018   Procedure: [id number];  Surgeon: Hessie Knows, MD;  Location: [facility] ORS;  Service: Orthopedics;  Laterality: N/A;    Prior to Admission medications   Medication Sig Start Date End Date Taking? Authorizing Provider  ACCU-CHEK AVIVA PLUS test strip  03/28/18   [provider]  aspirin 81 MG EC tablet Take 81 mg by mouth daily.     [provider]  BD AUTOSHIELD DUO  30G X 5 MM MISC  03/11/18   [provider]  BD PEN NEEDLE NANO U/F 32G X 4 MM MISC  03/24/18   [provider]  clindamycin (CLEOCIN T) 1 % external solution Apply 1 application topically 2  (two) times daily. 12/31/17   [provider]  clobetasol (TEMOVATE) 0.05 % external solution Apply 1 application topically every evening. (apply to scalp) 01/25/18   [provider]  colchicine 0.6 MG tablet Take 2 tabs PO x 1, then 1 tab PO 1 hour later x 1  Max: 1.8 mg total dose per attack, do not repeat within 3 days. 05/04/18   Kinley Dozier, Dannielle Karvonen, PA-C  Ergocalciferol (VITAMIN D2) 2000 units TABS Take 1 tablet by mouth daily.    [provider]  feeding supplement, ENSURE ENLIVE, (ENSURE ENLIVE) LIQD Take 237 mLs by mouth 2 (two) times daily between meals. 02/26/18   Fritzi Mandes, MD  ferrous fumarate (HEMOCYTE - 106 MG FE) 325 (106 Fe) MG TABS tablet Take 1 tablet (106 mg of iron total) by mouth 2 (two) times daily. 01/31/18   Hillary Bow, MD  glipiZIDE (GLUCOTROL) 10 MG tablet Take 10 mg by mouth daily.     [provider]  hydrOXYzine (ATARAX/VISTARIL) 10 MG tablet Take 10 mg by mouth at bedtime. 11/11/17   [provider]  Insulin Glargine (LANTUS SOLOSTAR) 100 UNIT/ML Solostar Pen Inject 36 Units into the skin daily. (may take up to 50u based upon blood glucose reading)    [provider]  Insulin Pen Needle (BD PEN NEEDLE NANO U/F) 32G X 4 MM MISC USE 1 NEEDLE SUBCUTANEOUSLY ONCE A DAY AS DIRECTED 03/24/18   [provider]  ketoconazole (NIZORAL) 2 % cream  03/11/18   [provider]  ketoconazole (NIZORAL) 2 % shampoo Apply 1 application topically daily. (apply to scalp and wait at least 30 minutes before rinsing) 01/25/18   [provider]  Lidocaine 4 % PTCH Place onto the skin. 03/12/18   [provider]  loratadine (CLARITIN) 10 MG tablet Take 10 mg by mouth daily.    [provider]  magnesium oxide (MAG-OX) 400 MG tablet Take 800 mg by mouth daily. 10/02/17   [provider]  methocarbamol (ROBAXIN) 500 MG tablet Take 500-1,500 mg by mouth See admin instructions. On day 1-3 take  1500 mg by mouth three times daily, on days 4-6 take 1000 mg by mouth three times daily, then take 1000 mg by mouth three times daily as needed. 03/12/18   [provider]  niacin (NIASPAN) 500 MG CR tablet Take 500 mg by mouth every evening. 12/25/17   [provider]  Omega-3 Fatty Acids (FISH OIL) 1000 MG CAPS Take 1 capsule by mouth.     [provider]  pantoprazole (PROTONIX) 40 MG tablet Take 1 tablet (40 mg total) by mouth daily. 02/26/18   Fritzi Mandes, MD  potassium chloride SA (K-DUR,KLOR-CON) 20 MEQ tablet Take 20 mEq by mouth 2 (two) times daily. 03/11/18   [provider]  simvastatin (ZOCOR) 20 MG tablet Take 20 mg by mouth at bedtime. 12/23/17   [provider]  sitaGLIPtin (JANUVIA) 50 MG tablet Take 50 mg by mouth daily.    [provider]  traMADol (ULTRAM) 50 MG tablet Take by mouth.    [provider]  traMADol (ULTRAM) 50 MG tablet Take 1 tablet (50 mg total) by mouth 2 (two) times daily. 05/04/18   Jourdyn Hasler,  Dannielle Karvonen, PA-C  TRAVATAN Z 0.004 % SOLN ophthalmic solution Place 1 drop into the left eye at bedtime. 12/05/17   [provider]  vitamin B-12 (CYANOCOBALAMIN) 1000 MCG tablet Take 1,000 mcg by mouth daily.    [provider]    Allergies Patient has no known allergies.  Family History  Problem Relation Age of Onset  . Kidney cancer Son   . Bladder Cancer Neg Hx     Social History Social History   Tobacco Use  . Smoking status: Former Smoker    Last attempt to quit: 07/10/2011    Years since quitting: 6.8  . Smokeless tobacco: Never Used  Substance Use Topics  . Alcohol use: No  . Drug use: No    Review of Systems  Constitutional: Negative for fever. Cardiovascular: Negative for chest pain. Respiratory: Negative for shortness of breath. Gastrointestinal: Negative for abdominal pain, vomiting and diarrhea. Genitourinary: Negative for dysuria. Musculoskeletal: Negative for  back pain.  Right foot pain, redness, and swelling as above. Skin: Negative for rash. Neurological: Negative for headaches, focal weakness or numbness. ____________________________________________  PHYSICAL EXAM:  VITAL SIGNS: ED Triage Vitals [05/04/18 1250]  Enc Vitals Group     BP (!) 143/54     Pulse Rate 87     Resp 16     Temp 98.6 F (37 C)     Temp Source Oral     SpO2 94 %     Weight      Height      Head Circumference      Peak Flow      Pain Score 8     Pain Loc      Pain Edu?      Excl. in [place]?     Constitutional: Alert and oriented. Well appearing and in no distress. Head: Normocephalic and atraumatic. Eyes: Conjunctivae are normal. Normal extraocular movements Cardiovascular: Normal rate, regular rhythm. Normal distal pulses. Respiratory: Normal respiratory effort. No wheezes/rales/rhonchi. Musculoskeletal: Right foot without obvious deformity, dislocation, or joint effusion.  Patient with significant soft tissue swelling across the dorsum and lateral aspect of the right foot.  There is also well-demarcated erythema and warmth in the same region.  No obvious skin lesions, abrasions, lacerations, pointing, fluctuance is noted.  Nontender with normal range of motion in all extremities.  Neurologic:  Normal gross sensation. Normal speech and language. No gross focal neurologic deficits are appreciated. Skin:  Skin is warm, dry and intact. No rash noted.  Right foot without any lymphangitis.  Psychiatric: Mood and affect are normal. Patient exhibits appropriate insight and judgment. ____________________________________________   LABS (pertinent positives/negatives)  Labs Reviewed  CBC WITH DIFFERENTIAL/PLATELET - Abnormal; Notable for the following components:      Result Value   RBC 3.46 (*)    Hemoglobin 11.0 (*)    HCT 34.7 (*)    MCV 100.3 (*)    All other components within normal limits  BASIC METABOLIC PANEL - Abnormal; Notable for the following  components:   Creatinine, Ser 1.26 (*)    GFR calc non Af Amer 40 (*)    GFR calc Af Amer 46 (*)    All other components within normal limits  URIC ACID - Abnormal; Notable for the following components:   Uric Acid, Serum 8.5 (*)    All other components within normal limits  ____________________________________________   RADIOLOGY  Right Foot  IMPRESSION: No fracture or dislocation.  Mild dorsal soft tissue swelling  ____________________________________________  PROCEDURES  Procedures Colcrys 1.2 mg PO Ultram 50 mg PO ____________________________________________  INITIAL IMPRESSION / ASSESSMENT AND PLAN / ED COURSE  Geriatric patient with ED evaluation of sudden right foot pain, redness, and swelling.  The patient's exam was concerning for either an acute inflammatory process or an acute infectious process.  Labs revealed an elevated uric acid at 8.7 mg/dl.  Patient and her son are reassured by the normal white blood cell count and the negative x-ray.  Patient's clinical picture and labs are consistent with an acute gout flare.  This represents the patient's first known gout flare.  She will be discharged with a prescription for colchicine to take as directed. She was also was given a small prescription for tramadol to take as needed.  She will follow-up with primary provider for ongoing symptom management.  I reviewed the patient's prescription history over the last 12 months in the multi-state controlled substances database(s) that includes [place], [place], [place], [place], [place], [place], [place], [place], [place], [place], [place], [place], [place], and [place].  Results were notable for no recent prescriptions.  ____________________________________________  FINAL CLINICAL IMPRESSION(S) / ED DIAGNOSES  Final diagnoses:  Acute idiopathic gout of right foot      Melvenia Needles, PA-C 05/04/18 1737    Schuyler Amor,  MD 05/15/18 [phone]

## 2018-05-04 NOTE — ED Notes (Signed)
Pt has  Pain and  Swelling r  Foot   X  2  Days  Ago   She reports  Pain is  Worse on weight  Bearing    Denies  Any injury   The  Area   Is  Red

## 2018-05-07 ENCOUNTER — Ambulatory Visit: Payer: Medicare HMO | Admitting: Gastroenterology

## 2018-05-09 DIAGNOSIS — E1142 Type 2 diabetes mellitus with diabetic polyneuropathy: Secondary | ICD-10-CM | POA: Diagnosis not present

## 2018-05-09 DIAGNOSIS — E785 Hyperlipidemia, unspecified: Secondary | ICD-10-CM | POA: Diagnosis not present

## 2018-05-09 DIAGNOSIS — M810 Age-related osteoporosis without current pathological fracture: Secondary | ICD-10-CM | POA: Diagnosis not present

## 2018-05-09 DIAGNOSIS — Z794 Long term (current) use of insulin: Secondary | ICD-10-CM | POA: Diagnosis not present

## 2018-05-09 DIAGNOSIS — Z23 Encounter for immunization: Secondary | ICD-10-CM | POA: Diagnosis not present

## 2018-05-09 DIAGNOSIS — E1169 Type 2 diabetes mellitus with other specified complication: Secondary | ICD-10-CM | POA: Diagnosis not present

## 2018-06-13 ENCOUNTER — Other Ambulatory Visit: Payer: Self-pay

## 2018-06-13 ENCOUNTER — Encounter: Payer: Self-pay | Admitting: Gastroenterology

## 2018-06-13 ENCOUNTER — Ambulatory Visit (INDEPENDENT_AMBULATORY_CARE_PROVIDER_SITE_OTHER): Payer: Medicare HMO | Admitting: Gastroenterology

## 2018-06-13 VITALS — BP 125/72 | HR 84 | Resp 17 | Ht 64.0 in | Wt 175.2 lb

## 2018-06-13 DIAGNOSIS — K7460 Unspecified cirrhosis of liver: Secondary | ICD-10-CM

## 2018-06-13 DIAGNOSIS — K5520 Angiodysplasia of colon without hemorrhage: Secondary | ICD-10-CM | POA: Diagnosis not present

## 2018-06-13 DIAGNOSIS — K7581 Nonalcoholic steatohepatitis (NASH): Secondary | ICD-10-CM

## 2018-06-13 DIAGNOSIS — D500 Iron deficiency anemia secondary to blood loss (chronic): Secondary | ICD-10-CM

## 2018-06-13 NOTE — Progress Notes (Signed)
[name], MD [address]  [place]  [place], [place] [address]  Main: [phone]  Fax: [phone]    Gastroenterology Consultation  Referring Provider:     Sofie Hartigan, MD Primary Care Physician:  Sofie Hartigan, MD Primary Gastroenterologist:  Dr. Gustavo Lah Reason for Consultation:     Chronic iron deficiency anemia, AVMs        HPI:   Emily Faulkner is a 77 y.o. female referred by Dr. Ellison Hughs [name], MD  for consultation & management of chronic iron deficiency anemia.  Patient is functionally independent, has metabolic syndrome, with chronic iron deficiency anemia, on oral iron therapy, diabetes, hypertension, hyperlipidemia, cirrhosis of liver, well compensated who was admitted to [facility] on 02/21/2018 secondary to severe symptomatic anemia, hemoglobin 6.3 and melena.  She was also taking oral iron.  During this admission, patient underwent push enteroscopy as well as colonoscopy which revealed small bowel AVMs as well as several AVMs in the ascending colon and cecum which were all treated with APC.  Patient was discharged home on oral iron therapy  She was also admitted in last week of July secondary to severe symptomatic anemia, underwent EGD which was unremarkable except for mild portal hypertensive gastropathy. Patient previously underwent EGD and colonoscopy in 08/2016, revealed nonbleeding AVMs in the ascending colon, left untreated. She never had a video capsule endoscopy performed.  Interval summary: Patient reports doing well since discharge.  She continues to be on oral iron.  She reports that her stools interchange between yellow, brown and black.  She has a follow-up appointment with Dr. Grayland Ormond on Friday this week, also scheduled for pre-clinic labs.  She is accompanied by her son today.  She denies any other GI symptoms.  Follow-up visit 07/14/2017 She had an attack of gout which is now in remission.  Currently on colchicine.  She continues to  take oral iron.  She reports having dark formed stools on oral iron.  Last hemoglobin 11 on 05/04/2018.  Normal iron stores as of 03/2018 Patient reports that her fatigue wax and wanes but overall doing well  NSAIDs: None  Antiplts/Anticoagulants/Anti thrombotics: Aspirin 81  GI Procedures:   Small bowel endoscopy 02/22/2018 - Three non-bleeding angioectasias in the jejunum. Treated with argon plasma coagulation (APC). - Normal esophagus. - Normal stomach. - No specimens collected.  Colonoscopy 02/22/2018 - Preparation of the colon was fair. - Multiple bleeding colonic angioectasias. Treated with argon plasma coagulation (APC). - The examination was otherwise normal on direct and retroflexion views. - No specimens collected.  EGD 01/30/2018 by Dr. Alice Reichert - Widely patent and non-obstructing Schatzki ring. - Portal hypertensive gastropathy. - Normal examined duodenum. - The examination was otherwise normal. - No specimens collected.  EGD and colonoscopy 08/2016 by Dr. Gustavo Lah for iron deficiency anemia AVMs, nonbleeding in ascending colon were detected, not treated DIAGNOSIS:  A. STOMACH, ANTRUM AND BODY; COLD BIOPSY:  - ANTRAL AND OXYNTIC MUCOSA WITH MODERATE CHRONIC ACTIVE GASTRITIS,  HELICOBACTER PYLORI ASSOCIATED.  - NEGATIVE FOR DYSPLASIA AND MALIGNANCY.   B. GE JUNCTION; COLD BIOPSY:  - REFLUX GASTROESOPHAGITIS.  - NEGATIVE FOR GOBLET CELLS, DYSPLASIA AND MALIGNANCY.   C. COLON, RANDOM RIGHT; COLD BIOPSY:  - NO PATHOLOGIC CHANGE.   D. COLON, RANDOM LEFT; COLD BIOPSY:  - NO PATHOLOGIC CHANGE.   E. RECTUM POLYP; COLD BIOPSY:  - POLYPOID SQUAMOCOLUMNAR (ANORECTAL) MUCOSA.  - PROMINENT LYMPHOID AGGREGATE.  - NEGATIVE FOR DYSPLASIA AND MALIGNANCY.  - MULTIPLE DEEPER LEVELS WERE  EXAMINED.   Past Medical History:  Diagnosis Date  . Cancer [facility])    breast cancer right mastectomy  . Chronic kidney disease   . Diabetes mellitus without complication ([place])   .  Glaucoma   . Hyperlipidemia   . Hypertension   . Neuropathy   . Osteoarthritis   . Rheumatoid arthritis ([place])   . Telangiectasias     Past Surgical History:  Procedure Laterality Date  . BREAST SURGERY     right mastectomy  . CHOLECYSTECTOMY    . COLONOSCOPY    . COLONOSCOPY WITH PROPOFOL N/A 08/14/2016   Procedure: COLONOSCOPY WITH PROPOFOL;  Surgeon: Lollie Sails, MD;  Location: [facility] ENDOSCOPY;  Service: Endoscopy;  Laterality: N/A;  . COLONOSCOPY WITH PROPOFOL N/A 02/22/2018   Procedure: COLONOSCOPY WITH PROPOFOL;  Surgeon: Jonathon Bellows, MD;  Location: [facility] ENDOSCOPY;  Service: Gastroenterology;  Laterality: N/A;  . ESOPHAGOGASTRODUODENOSCOPY (EGD) WITH PROPOFOL N/A 08/14/2016   Procedure: ESOPHAGOGASTRODUODENOSCOPY (EGD) WITH PROPOFOL;  Surgeon: Lollie Sails, MD;  Location: [facility] ENDOSCOPY;  Service: Endoscopy;  Laterality: N/A;  . ESOPHAGOGASTRODUODENOSCOPY (EGD) WITH PROPOFOL N/A 01/30/2018   Procedure: ESOPHAGOGASTRODUODENOSCOPY (EGD) WITH PROPOFOL;  Surgeon: Tol[place], Benay Pike, MD;  Location: [facility] ENDOSCOPY;  Service: Gastroenterology;  Laterality: N/A;  . ESOPHAGOGASTRODUODENOSCOPY (EGD) WITH PROPOFOL N/A 02/22/2018   Procedure: ESOPHAGOGASTRODUODENOSCOPY (EGD) WITH PROPOFOL;  Surgeon: Jonathon Bellows, MD;  Location: [facility] ENDOSCOPY;  Service: Gastroenterology;  Laterality: N/A;  . EXCISIONAL HEMORRHOIDECTOMY    . KYPHOPLASTY N/A 03/20/2018   Procedure: [id number];  Surgeon: Hessie Knows, MD;  Location: [facility] ORS;  Service: Orthopedics;  Laterality: N/A;    Current Outpatient Medications:  .  ACCU-CHEK AVIVA PLUS test strip, , Disp: , Rfl:  .  ACCU-CHEK SOFTCLIX LANCETS lancets, once daily Use as instructed., Disp: , Rfl:  .  ACCU-CHEK SOFTCLIX LANCETS lancets, ONCE DAILY USE AS INSTRUCTED., Disp: , Rfl: 5 .  aspirin 81 MG EC tablet, Take 81 mg by mouth daily. , Disp: , Rfl:  .  BD AUTOSHIELD DUO 30G X 5 MM MISC, , Disp: , Rfl:  .  BD PEN NEEDLE NANO U/F 32G X 4 MM MISC, ,  Disp: , Rfl:  .  colchicine 0.6 MG tablet, Take 2 tabs PO x 1, then 1 tab PO 1 hour later x 1  Max: 1.8 mg total dose per attack, do not repeat within 3 days., Disp: 10 tablet, Rfl: 0 .  Ergocalciferol (VITAMIN D2) 2000 units TABS, Take 1 tablet by mouth daily., Disp: , Rfl:  .  feeding supplement, ENSURE ENLIVE, (ENSURE ENLIVE) LIQD, Take 237 mLs by mouth 2 (two) times daily between meals., Disp: 237 mL, Rfl: 12 .  ferrous fumarate (HEMOCYTE - 106 MG FE) 325 (106 Fe) MG TABS tablet, Take 1 tablet (106 mg of iron total) by mouth 2 (two) times daily., Disp: 60 each, Rfl: 0 .  glipiZIDE (GLUCOTROL) 10 MG tablet, Take 10 mg by mouth daily. , Disp: , Rfl:  .  hydrOXYzine (ATARAX/VISTARIL) 10 MG tablet, Take 10 mg by mouth at bedtime., Disp: , Rfl: 1 .  Insulin Glargine (LANTUS SOLOSTAR) 100 UNIT/ML Solostar Pen, Inject 36 Units into the skin daily. (may take up to 50u based upon blood glucose reading), Disp: , Rfl:  .  Insulin Pen Needle (BD PEN NEEDLE NANO U/F) 32G X 4 MM MISC, USE 1 NEEDLE SUBCUTANEOUSLY ONCE A DAY AS DIRECTED, Disp: , Rfl:  .  ketoconazole (NIZORAL) 2 % cream, , Disp: , Rfl:  .  ketoconazole (NIZORAL) 2 % shampoo, Apply 1 application topically daily. (apply to scalp and wait at least 30 minutes before rinsing), Disp: , Rfl:  .  magnesium oxide (MAG-OX) 400 MG tablet, Take 800 mg by mouth daily., Disp: , Rfl:  .  niacin (NIASPAN) 500 MG CR tablet, Take 500 mg by mouth every evening., Disp: , Rfl: 2 .  Omega-3 Fatty Acids (FISH OIL) 1000 MG CAPS, Take 1 capsule by mouth. , Disp: , Rfl:  .  pantoprazole (PROTONIX) 40 MG tablet, Take 1 tablet (40 mg total) by mouth daily., Disp: 30 tablet, Rfl: 0 .  potassium chloride SA (K-DUR,KLOR-CON) 20 MEQ tablet, Take 20 mEq by mouth 2 (two) times daily., Disp: , Rfl:  .  simvastatin (ZOCOR) 20 MG tablet, Take 20 mg by mouth at bedtime., Disp: , Rfl: 6 .  sitaGLIPtin (JANUVIA) 50 MG tablet, Take 50 mg by mouth daily., Disp: , Rfl:  .  TRAVATAN Z  0.004 % SOLN ophthalmic solution, Place 1 drop into the left eye at bedtime., Disp: , Rfl:  .  vitamin B-12 (CYANOCOBALAMIN) 1000 MCG tablet, Take 1,000 mcg by mouth daily., Disp: , Rfl:  .  alendronate (FOSAMAX) 70 MG tablet, TAKE 1 TABLET BY MOUTH EVERY 7DAYS WITH A FULL GLASS OF WATER. DO NOT LIE DOWN FOR 30 MINUTES, Disp: , Rfl: 3 .  clindamycin (CLEOCIN T) 1 % external solution, Apply 1 application topically 2 (two) times daily., Disp: , Rfl: 2 .  clobetasol (TEMOVATE) 0.05 % external solution, Apply 1 application topically every evening. (apply to scalp), Disp: , Rfl:  .  Lidocaine 4 % PTCH, Place onto the skin., Disp: , Rfl:  .  loratadine (CLARITIN) 10 MG tablet, Take 10 mg by mouth daily., Disp: , Rfl:  .  Magnesium Oxide 400 (240 Mg) MG TABS, TAKE 2 TABLETS (800 MG TOTAL) BY MOUTH DAILY., Disp: , Rfl: 11 .  methocarbamol (ROBAXIN) 500 MG tablet, Take 500-1,500 mg by mouth See admin instructions. On day 1-3 take 1500 mg by mouth three times daily, on days 4-6 take 1000 mg by mouth three times daily, then take 1000 mg by mouth three times daily as needed., Disp: , Rfl: 0 .  traMADol (ULTRAM) 50 MG tablet, Take by mouth., Disp: , Rfl:  .  traMADol (ULTRAM) 50 MG tablet, Take 1 tablet (50 mg total) by mouth 2 (two) times daily. (Patient not taking: Reported on 06/13/2018), Disp: 10 tablet, Rfl: 0   Family History  Problem Relation Age of Onset  . Kidney cancer Son   . Bladder Cancer Neg Hx      Social History   Tobacco Use  . Smoking status: Former Smoker    Last attempt to quit: 07/10/2011    Years since quitting: 6.9  . Smokeless tobacco: Never Used  Substance Use Topics  . Alcohol use: No  . Drug use: No    Allergies as of 06/13/2018  . (No Known Allergies)    Review of Systems:    All systems reviewed and negative except where noted in HPI.   Physical Exam:  BP 125/72 (BP Location: Left Arm, Patient Position: Sitting, Cuff Size: Large)   Pulse 84   Resp 17   Ht 5' 4"   (1.626 m)   Wt 175 lb 3.2 oz (79.5 kg)   BMI 30.07 kg/m  No LMP recorded. Patient is postmenopausal.  General:   Alert,  Well-developed, well-nourished, pleasant and cooperative in NAD Head:  Normocephalic and atraumatic.  Eyes:  Sclera clear, no icterus.   Conjunctiva pink. Ears:  Normal auditory acuity. Nose:  No deformity, discharge, or lesions. Mouth:  No deformity or lesions,oropharynx pink & moist. Neck:  Supple; no masses or thyromegaly. Lungs:  Respirations even and unlabored.  Clear throughout to auscultation.   No wheezes, crackles, or rhonchi. No acute distress. Heart:  Regular rate and rhythm; no murmurs, clicks, rubs, or gallops. Abdomen:  Normal bowel sounds. Soft, obese, non-tender and non-distended without masses, hepatosplenomegaly or hernias noted.  No guarding or rebound tenderness.   Rectal: Not performed Msk:  Symmetrical without gross deformities. Good, equal movement & strength bilaterally. Pulses:  Normal pulses noted. Extremities:  No clubbing or edema.  No cyanosis. Neurologic:  Alert and oriented x3;  grossly normal neurologically. Skin:  Intact without significant lesions or rashes. No jaundice. Psych:  Alert and cooperative. Normal mood and affect.  Imaging Studies: Reviewed  Assessment and Plan:   Emily Faulkner is a 77 y.o. Caucasian female with metabolic syndrome, well compensated cirrhosis, probably NASH in etiology, chronic iron deficiency anemia secondary to chronic blood loss from small bowel and colonic AVMs.  She underwent small bowel endoscopy and colonoscopy, AVMs in jejunum, AC and cecum that were detected were cauterized  Iron deficiency anemia secondary to chronic blood loss from small bowel and colonic AVMs -Follow-up labs to be done end of December at Dr. Gary Fleet office -Most recent ferritin levels were normal as of 03/2018 -No evidence of B12 or folate deficiency -I have discussed with her about video capsule endoscopy and possible  balloon enteroscopy if there are more small bowel AVMs, however, patient would like to wait for next follow-up visit before undergoing this test  -Continue oral iron for now  Decompensated cirrhosis, probably NASH in etiology Portal hypertension: Manifested as mild portal hypertensive gastropathy and splenomegaly No evidence of varices based on EGD from 01/2018 Patient is euvolemic, no evidence of ascites HCC screening: Ultrasound liver in 07/2017 with no liver lesions.  Repeat ultrasound for [place] surveillance during next visit as well as check AFP levels PSE none  Follow up in 3 months   [name], MD

## 2018-06-20 ENCOUNTER — Telehealth: Payer: Self-pay | Admitting: Urology

## 2018-06-20 ENCOUNTER — Ambulatory Visit: Payer: Medicare HMO | Admitting: Urology

## 2018-06-20 NOTE — Telephone Encounter (Signed)
This patient does not need to be seen until June and does not need a CT scan.  She needs a renal ultrasound.  She did have a CT scan when she was recently admitted which shows that her spleen is enlarged due to her underlying cirrhosis.  This does not need to be repeated.  Let stick to the follow-up plan with a renal ultrasound in June.  Make sure that her follow-up is appropriate and that she understands this.  Hollice Espy, MD

## 2018-06-20 NOTE — Telephone Encounter (Signed)
See message below °

## 2018-06-20 NOTE — Telephone Encounter (Signed)
-----   Message from Royanne Foots, [place] sent at 06/19/2018  1:59 PM EST ----- Hey  Mrs. Siglin is scheduled in [place] tomorrow to see dr. Jacinto Reap . Can you please cancel her apt, she was to have a CTscan of the abd but had to cancel due to being sick, we were going to call with results. Her follow up with brandon should be kept in June of next year. Please have ct call her to reschedule scan she would like this done in [place] thanks

## 2018-06-20 NOTE — Telephone Encounter (Signed)
Ok, that's what I thought but wanted to check with you.  Thanks  [employer]

## 2018-06-20 NOTE — Telephone Encounter (Signed)
Judson Roch wanted her follow up app reschd which I did  and said she needed to reschd her CT scan. I will need a new order for this the other one has expired.   Thanks, Sharyn Lull

## 2018-06-25 ENCOUNTER — Other Ambulatory Visit: Payer: Self-pay | Admitting: *Deleted

## 2018-06-25 DIAGNOSIS — D649 Anemia, unspecified: Secondary | ICD-10-CM

## 2018-06-29 NOTE — Progress Notes (Deleted)
[facility]  Telephone:(336) [phone] Fax:(336) [phone]  ID: [name] OB: May 15, 1941  MR#: [id number]  QZE#:[id number]  Patient Care Team: Sofie Hartigan, MD as PCP - General (Family Medicine)  CHIEF COMPLAINT: Iron deficiency anemia.  INTERVAL HISTORY: Patient returns to clinic today for repeat laboratory work, further evaluation, consideration of additional IV Feraheme.  She currently feels well and is asymptomatic.  She does not complain of weakness or fatigue today. She has no neurologic complaints.  She denies any recent fevers.  She has good appetite and denies weight loss.  She has no chest pain or shortness of breath.  She denies any nausea, vomiting, constipation, or diarrhea.  She has noted no further melena or hematochezia.  She has no urinary complaints.  Patient feels that her baseline offers no specific complaints today.  REVIEW OF SYSTEMS:   Review of Systems  Constitutional: Negative for fever, malaise/fatigue and weight loss.  HENT: Negative.  Negative for congestion.   Respiratory: Negative.  Negative for cough, hemoptysis and shortness of breath.   Cardiovascular: Negative.  Negative for chest pain and leg swelling.  Gastrointestinal: Negative for abdominal pain, blood in stool, constipation, diarrhea, nausea and vomiting.  Genitourinary: Negative.  Negative for dysuria, frequency and urgency.  Musculoskeletal: Negative.   Skin: Negative.  Negative for rash.  Neurological: Negative.  Negative for tingling, tremors, weakness and headaches.  Psychiatric/Behavioral: Negative.  Negative for depression. The patient is not nervous/anxious.    As per HPI. Otherwise, a complete review of systems is negative.  PAST MEDICAL HISTORY: Past Medical History:  Diagnosis Date  . Cancer [facility])    breast cancer right mastectomy  . Chronic kidney disease   . Diabetes mellitus without complication ([place])   . GIB (gastrointestinal bleeding) 02/20/2018  .  Glaucoma   . Hyperlipidemia   . Hypertension   . Neuropathy   . Osteoarthritis   . Rheumatoid arthritis ([place])   . Telangiectasias     PAST SURGICAL HISTORY: Past Surgical History:  Procedure Laterality Date  . BREAST SURGERY     right mastectomy  . CHOLECYSTECTOMY    . COLONOSCOPY    . COLONOSCOPY WITH PROPOFOL N/A 08/14/2016   Procedure: COLONOSCOPY WITH PROPOFOL;  Surgeon: Lollie Sails, MD;  Location: [facility] ENDOSCOPY;  Service: Endoscopy;  Laterality: N/A;  . COLONOSCOPY WITH PROPOFOL N/A 02/22/2018   Procedure: COLONOSCOPY WITH PROPOFOL;  Surgeon: Jonathon Bellows, MD;  Location: [facility] ENDOSCOPY;  Service: Gastroenterology;  Laterality: N/A;  . ESOPHAGOGASTRODUODENOSCOPY (EGD) WITH PROPOFOL N/A 08/14/2016   Procedure: ESOPHAGOGASTRODUODENOSCOPY (EGD) WITH PROPOFOL;  Surgeon: Lollie Sails, MD;  Location: [facility] ENDOSCOPY;  Service: Endoscopy;  Laterality: N/A;  . ESOPHAGOGASTRODUODENOSCOPY (EGD) WITH PROPOFOL N/A 01/30/2018   Procedure: ESOPHAGOGASTRODUODENOSCOPY (EGD) WITH PROPOFOL;  Surgeon: Toledo, Benay Pike, MD;  Location: [facility] ENDOSCOPY;  Service: Gastroenterology;  Laterality: N/A;  . ESOPHAGOGASTRODUODENOSCOPY (EGD) WITH PROPOFOL N/A 02/22/2018   Procedure: ESOPHAGOGASTRODUODENOSCOPY (EGD) WITH PROPOFOL;  Surgeon: Jonathon Bellows, MD;  Location: [facility] ENDOSCOPY;  Service: Gastroenterology;  Laterality: N/A;  . EXCISIONAL HEMORRHOIDECTOMY    . KYPHOPLASTY N/A 03/20/2018   Procedure: [id number];  Surgeon: Hessie Knows, MD;  Location: [facility] ORS;  Service: Orthopedics;  Laterality: N/A;    FAMILY HISTORY: Reviewed and unchanged. No reported history of malignancy or chronic disease.  ADVANCED DIRECTIVES (Y/N):  N  HEALTH MAINTENANCE: Social History   Tobacco Use  . Smoking status: Former Smoker    Last attempt to quit: 07/10/2011    Years since quitting:  6.9  . Smokeless tobacco: Never Used  Substance Use Topics  . Alcohol use: No  . Drug use: No      Colonoscopy:  PAP:  Bone density:  Lipid panel:  No Known Allergies  Current Outpatient Medications  Medication Sig Dispense Refill  . ACCU-CHEK AVIVA PLUS test strip     . ACCU-CHEK SOFTCLIX LANCETS lancets once daily Use as instructed.    Marland Kitchen ACCU-CHEK SOFTCLIX LANCETS lancets ONCE DAILY USE AS INSTRUCTED.  5  . alendronate (FOSAMAX) 70 MG tablet TAKE 1 TABLET BY MOUTH EVERY 7DAYS WITH A FULL GLASS OF WATER. DO NOT LIE DOWN FOR 30 MINUTES  3  . aspirin 81 MG EC tablet Take 81 mg by mouth daily.     . BD AUTOSHIELD DUO 30G X 5 MM MISC     . BD PEN NEEDLE NANO U/F 32G X 4 MM MISC     . clindamycin (CLEOCIN T) 1 % external solution Apply 1 application topically 2 (two) times daily.  2  . clobetasol (TEMOVATE) 0.05 % external solution Apply 1 application topically every evening. (apply to scalp)    . colchicine 0.6 MG tablet Take 2 tabs PO x 1, then 1 tab PO 1 hour later x 1  Max: 1.8 mg total dose per attack, do not repeat within 3 days. 10 tablet 0  . Ergocalciferol (VITAMIN D2) 2000 units TABS Take 1 tablet by mouth daily.    . feeding supplement, ENSURE ENLIVE, (ENSURE ENLIVE) LIQD Take 237 mLs by mouth 2 (two) times daily between meals. 237 mL 12  . ferrous fumarate (HEMOCYTE - 106 MG FE) 325 (106 Fe) MG TABS tablet Take 1 tablet (106 mg of iron total) by mouth 2 (two) times daily. 60 each 0  . glipiZIDE (GLUCOTROL) 10 MG tablet Take 10 mg by mouth daily.     . hydrOXYzine (ATARAX/VISTARIL) 10 MG tablet Take 10 mg by mouth at bedtime.  1  . Insulin Glargine (LANTUS SOLOSTAR) 100 UNIT/ML Solostar Pen Inject 36 Units into the skin daily. (may take up to 50u based upon blood glucose reading)    . Insulin Pen Needle (BD PEN NEEDLE NANO U/F) 32G X 4 MM MISC USE 1 NEEDLE SUBCUTANEOUSLY ONCE A DAY AS DIRECTED    . ketoconazole (NIZORAL) 2 % cream     . ketoconazole (NIZORAL) 2 % shampoo Apply 1 application topically daily. (apply to scalp and wait at least 30 minutes before rinsing)     . Lidocaine 4 % PTCH Place onto the skin.    Marland Kitchen loratadine (CLARITIN) 10 MG tablet Take 10 mg by mouth daily.    . magnesium oxide (MAG-OX) 400 MG tablet Take 800 mg by mouth daily.    . Magnesium Oxide 400 (240 Mg) MG TABS TAKE 2 TABLETS (800 MG TOTAL) BY MOUTH DAILY.  11  . methocarbamol (ROBAXIN) 500 MG tablet Take 500-1,500 mg by mouth See admin instructions. On day 1-3 take 1500 mg by mouth three times daily, on days 4-6 take 1000 mg by mouth three times daily, then take 1000 mg by mouth three times daily as needed.  0  . niacin (NIASPAN) 500 MG CR tablet Take 500 mg by mouth every evening.  2  . Omega-3 Fatty Acids (FISH OIL) 1000 MG CAPS Take 1 capsule by mouth.     . pantoprazole (PROTONIX) 40 MG tablet Take 1 tablet (40 mg total) by mouth daily. 30 tablet 0  . potassium chloride SA (K-DUR,KLOR-CON)  20 MEQ tablet Take 20 mEq by mouth 2 (two) times daily.    . simvastatin (ZOCOR) 20 MG tablet Take 20 mg by mouth at bedtime.  6  . sitaGLIPtin (JANUVIA) 50 MG tablet Take 50 mg by mouth daily.    . traMADol (ULTRAM) 50 MG tablet Take by mouth.    . traMADol (ULTRAM) 50 MG tablet Take 1 tablet (50 mg total) by mouth 2 (two) times daily. (Patient not taking: Reported on 06/13/2018) 10 tablet 0  . TRAVATAN Z 0.004 % SOLN ophthalmic solution Place 1 drop into the left eye at bedtime.    . vitamin B-12 (CYANOCOBALAMIN) 1000 MCG tablet Take 1,000 mcg by mouth daily.     No current facility-administered medications for this visit.     OBJECTIVE: There were no vitals filed for this visit.   There is no height or weight on file to calculate BMI.    ECOG FS:1 - Symptomatic but completely ambulatory  General: Well-developed, well-nourished, no acute distress. Eyes: Pink conjunctiva, anicteric sclera. HEENT: Normocephalic, moist mucous membranes. Lungs: Clear to auscultation bilaterally. Heart: Regular rate and rhythm. No rubs, murmurs, or gallops. Abdomen: Soft, nontender, nondistended. No  organomegaly noted, normoactive bowel sounds. Musculoskeletal: No edema, cyanosis, or clubbing. Neuro: Alert, answering all questions appropriately. Cranial nerves grossly intact. Skin: No rashes or petechiae noted. Psych: Normal affect.   LAB RESULTS:  Lab Results  Component Value Date   NA 138 05/04/2018   K 5.0 05/04/2018   CL 102 05/04/2018   CO2 26 05/04/2018   GLUCOSE 76 05/04/2018   BUN 16 05/04/2018   CREATININE 1.26 (H) 05/04/2018   CALCIUM 9.4 05/04/2018   PROT 8.2 (H) 03/17/2018   ALBUMIN 4.3 03/17/2018   AST 44 (H) 03/17/2018   ALT 23 03/17/2018   ALKPHOS 92 03/17/2018   BILITOT 1.4 (H) 03/17/2018   GFRNONAA 40 (L) 05/04/2018   GFRAA 46 (L) 05/04/2018    Lab Results  Component Value Date   WBC 8.6 05/04/2018   NEUTROABS 6.0 05/04/2018   HGB 11.0 (L) 05/04/2018   HCT 34.7 (L) 05/04/2018   MCV 100.3 (H) 05/04/2018   PLT 220 05/04/2018   Lab Results  Component Value Date   IRON 44 04/04/2018   TIBC 329 04/04/2018   IRONPCTSAT 13 04/04/2018   Lab Results  Component Value Date   FERRITIN 97 04/04/2018    STUDIES: No results found.  ASSESSMENT: Iron deficiency anemia.  PLAN:    1. Iron deficiency anemia: Secondary to recent GI bleed.  Patient had upper endoscopy on January 30, 2018.  She had a small bowel endoscopy and colonoscopy on February 22, 2018 that revealed multiple ulcerations that were subsequently treated with argon laser.  She denies any further melena or hematochezia.  Her hemoglobin has trended down slightly to 11.6, but is essentially stable.  Iron stores continue to be within normal limits.  No intervention is needed at this time.  Patient does not require IV Feraheme.  Return to clinic in 3 months with repeat laboratory work and further evaluation. 2. Left kidney mass: Continue follow-up with urology as indicated.  I spent a total of 20 minutes face-to-face with the patient of which greater than 50% of the visit was spent in counseling and  coordination of care as detailed above.   Patient expressed understanding and was in agreement with this plan. She also understands that She can call clinic at any time with any questions, concerns, or complaints.  Lloyd Huger, MD 06/29/18 9:54 AM

## 2018-06-30 ENCOUNTER — Inpatient Hospital Stay: Payer: Medicare HMO | Attending: Oncology

## 2018-06-30 DIAGNOSIS — D649 Anemia, unspecified: Secondary | ICD-10-CM

## 2018-06-30 DIAGNOSIS — D509 Iron deficiency anemia, unspecified: Secondary | ICD-10-CM | POA: Insufficient documentation

## 2018-06-30 LAB — CBC WITH DIFFERENTIAL/PLATELET
Abs Immature Granulocytes: 0.02 10*3/uL (ref 0.00–0.07)
BASOS PCT: 1 %
Basophils Absolute: 0 10*3/uL (ref 0.0–0.1)
Eosinophils Absolute: 0.6 10*3/uL — ABNORMAL HIGH (ref 0.0–0.5)
Eosinophils Relative: 9 %
HCT: 36 % (ref 36.0–46.0)
Hemoglobin: 11.5 g/dL — ABNORMAL LOW (ref 12.0–15.0)
IMMATURE GRANULOCYTES: 0 %
Lymphocytes Relative: 18 %
Lymphs Abs: 1.2 10*3/uL (ref 0.7–4.0)
MCH: 30.9 pg (ref 26.0–34.0)
MCHC: 31.9 g/dL (ref 30.0–36.0)
MCV: 96.8 fL (ref 80.0–100.0)
Monocytes Absolute: 0.5 10*3/uL (ref 0.1–1.0)
Monocytes Relative: 7 %
NEUTROS ABS: 4.2 10*3/uL (ref 1.7–7.7)
NEUTROS PCT: 65 %
Platelets: 182 10*3/uL (ref 150–400)
RBC: 3.72 MIL/uL — ABNORMAL LOW (ref 3.87–5.11)
RDW: 14.2 % (ref 11.5–15.5)
WBC: 6.5 10*3/uL (ref 4.0–10.5)
nRBC: 0 % (ref 0.0–0.2)

## 2018-06-30 LAB — IRON AND TIBC
Iron: 48 ug/dL (ref 28–170)
Saturation Ratios: 12 % (ref 10.4–31.8)
TIBC: 397 ug/dL (ref 250–450)
UIBC: 349 ug/dL

## 2018-06-30 LAB — FERRITIN: FERRITIN: 40 ng/mL (ref 11–307)

## 2018-07-04 ENCOUNTER — Inpatient Hospital Stay: Payer: Medicare HMO | Admitting: Oncology

## 2018-07-04 ENCOUNTER — Inpatient Hospital Stay: Payer: Medicare HMO

## 2018-07-04 ENCOUNTER — Other Ambulatory Visit: Payer: Medicare HMO

## 2018-07-16 DIAGNOSIS — H401123 Primary open-angle glaucoma, left eye, severe stage: Secondary | ICD-10-CM | POA: Diagnosis not present

## 2018-07-30 DIAGNOSIS — L578 Other skin changes due to chronic exposure to nonionizing radiation: Secondary | ICD-10-CM | POA: Diagnosis not present

## 2018-07-30 DIAGNOSIS — L218 Other seborrheic dermatitis: Secondary | ICD-10-CM | POA: Diagnosis not present

## 2018-07-30 DIAGNOSIS — L853 Xerosis cutis: Secondary | ICD-10-CM | POA: Diagnosis not present

## 2018-07-31 DIAGNOSIS — N183 Chronic kidney disease, stage 3 (moderate): Secondary | ICD-10-CM | POA: Diagnosis not present

## 2018-07-31 DIAGNOSIS — D631 Anemia in chronic kidney disease: Secondary | ICD-10-CM | POA: Diagnosis not present

## 2018-07-31 DIAGNOSIS — I10 Essential (primary) hypertension: Secondary | ICD-10-CM | POA: Diagnosis not present

## 2018-07-31 DIAGNOSIS — N2581 Secondary hyperparathyroidism of renal origin: Secondary | ICD-10-CM | POA: Diagnosis not present

## 2018-08-01 ENCOUNTER — Ambulatory Visit: Payer: Medicare HMO | Admitting: Urology

## 2018-08-04 DIAGNOSIS — M2550 Pain in unspecified joint: Secondary | ICD-10-CM | POA: Diagnosis not present

## 2018-08-04 DIAGNOSIS — E782 Mixed hyperlipidemia: Secondary | ICD-10-CM | POA: Diagnosis not present

## 2018-08-04 DIAGNOSIS — E1140 Type 2 diabetes mellitus with diabetic neuropathy, unspecified: Secondary | ICD-10-CM | POA: Diagnosis not present

## 2018-08-04 DIAGNOSIS — K7460 Unspecified cirrhosis of liver: Secondary | ICD-10-CM | POA: Diagnosis not present

## 2018-08-04 DIAGNOSIS — I10 Essential (primary) hypertension: Secondary | ICD-10-CM | POA: Diagnosis not present

## 2018-08-04 DIAGNOSIS — Z0000 Encounter for general adult medical examination without abnormal findings: Secondary | ICD-10-CM | POA: Diagnosis not present

## 2018-08-04 DIAGNOSIS — D508 Other iron deficiency anemias: Secondary | ICD-10-CM | POA: Diagnosis not present

## 2018-08-04 DIAGNOSIS — N183 Chronic kidney disease, stage 3 (moderate): Secondary | ICD-10-CM | POA: Diagnosis not present

## 2018-08-04 DIAGNOSIS — N2889 Other specified disorders of kidney and ureter: Secondary | ICD-10-CM | POA: Diagnosis not present

## 2018-09-08 NOTE — Progress Notes (Deleted)
[facility]  Telephone:(336) [phone] Fax:(336) [phone]  ID: [name] OB: 1940-08-05  MR#: [id number]  LPF#:[id number]  Patient Care Team: Sofie Hartigan, MD as PCP - General (Family Medicine)  CHIEF COMPLAINT: Iron deficiency anemia.  INTERVAL HISTORY: Patient returns to clinic today for repeat laboratory work, further evaluation, consideration of additional IV Feraheme.  She currently feels well and is asymptomatic.  She does not complain of weakness or fatigue today. She has no neurologic complaints.  She denies any recent fevers.  She has good appetite and denies weight loss.  She has no chest pain or shortness of breath.  She denies any nausea, vomiting, constipation, or diarrhea.  She has noted no further melena or hematochezia.  She has no urinary complaints.  Patient feels that her baseline offers no specific complaints today.  REVIEW OF SYSTEMS:   Review of Systems  Constitutional: Negative for fever, malaise/fatigue and weight loss.  HENT: Negative.  Negative for congestion.   Respiratory: Negative.  Negative for cough, hemoptysis and shortness of breath.   Cardiovascular: Negative.  Negative for chest pain and leg swelling.  Gastrointestinal: Negative for abdominal pain, blood in stool, constipation, diarrhea, nausea and vomiting.  Genitourinary: Negative.  Negative for dysuria, frequency and urgency.  Musculoskeletal: Negative.   Skin: Negative.  Negative for rash.  Neurological: Negative.  Negative for tingling, tremors, weakness and headaches.  Psychiatric/Behavioral: Negative.  Negative for depression. The patient is not nervous/anxious.    As per HPI. Otherwise, a complete review of systems is negative.  PAST MEDICAL HISTORY: Past Medical History:  Diagnosis Date  . Cancer [facility])    breast cancer right mastectomy  . Chronic kidney disease   . Diabetes mellitus without complication ([place])   . GIB (gastrointestinal bleeding) 02/20/2018  .  Glaucoma   . Hyperlipidemia   . Hypertension   . Neuropathy   . Osteoarthritis   . Rheumatoid arthritis ([place])   . Telangiectasias     PAST SURGICAL HISTORY: Past Surgical History:  Procedure Laterality Date  . BREAST SURGERY     right mastectomy  . CHOLECYSTECTOMY    . COLONOSCOPY    . COLONOSCOPY WITH PROPOFOL N/A 08/14/2016   Procedure: COLONOSCOPY WITH PROPOFOL;  Surgeon: Lollie Sails, MD;  Location: [facility] ENDOSCOPY;  Service: Endoscopy;  Laterality: N/A;  . COLONOSCOPY WITH PROPOFOL N/A 02/22/2018   Procedure: COLONOSCOPY WITH PROPOFOL;  Surgeon: Jonathon Bellows, MD;  Location: [facility] ENDOSCOPY;  Service: Gastroenterology;  Laterality: N/A;  . ESOPHAGOGASTRODUODENOSCOPY (EGD) WITH PROPOFOL N/A 08/14/2016   Procedure: ESOPHAGOGASTRODUODENOSCOPY (EGD) WITH PROPOFOL;  Surgeon: Lollie Sails, MD;  Location: [facility] ENDOSCOPY;  Service: Endoscopy;  Laterality: N/A;  . ESOPHAGOGASTRODUODENOSCOPY (EGD) WITH PROPOFOL N/A 01/30/2018   Procedure: ESOPHAGOGASTRODUODENOSCOPY (EGD) WITH PROPOFOL;  Surgeon: Toledo, Benay Pike, MD;  Location: [facility] ENDOSCOPY;  Service: Gastroenterology;  Laterality: N/A;  . ESOPHAGOGASTRODUODENOSCOPY (EGD) WITH PROPOFOL N/A 02/22/2018   Procedure: ESOPHAGOGASTRODUODENOSCOPY (EGD) WITH PROPOFOL;  Surgeon: Jonathon Bellows, MD;  Location: [facility] ENDOSCOPY;  Service: Gastroenterology;  Laterality: N/A;  . EXCISIONAL HEMORRHOIDECTOMY    . KYPHOPLASTY N/A 03/20/2018   Procedure: [id number];  Surgeon: Hessie Knows, MD;  Location: [facility] ORS;  Service: Orthopedics;  Laterality: N/A;    FAMILY HISTORY: Reviewed and unchanged. No reported history of malignancy or chronic disease.  ADVANCED DIRECTIVES (Y/N):  N  HEALTH MAINTENANCE: Social History   Tobacco Use  . Smoking status: Former Smoker    Last attempt to quit: 07/10/2011    Years since quitting:  7.1  . Smokeless tobacco: Never Used  Substance Use Topics  . Alcohol use: No  . Drug use: No      Colonoscopy:  PAP:  Bone density:  Lipid panel:  No Known Allergies  Current Outpatient Medications  Medication Sig Dispense Refill  . ACCU-CHEK AVIVA PLUS test strip     . ACCU-CHEK SOFTCLIX LANCETS lancets once daily Use as instructed.    Marland Kitchen ACCU-CHEK SOFTCLIX LANCETS lancets ONCE DAILY USE AS INSTRUCTED.  5  . alendronate (FOSAMAX) 70 MG tablet TAKE 1 TABLET BY MOUTH EVERY 7DAYS WITH A FULL GLASS OF WATER. DO NOT LIE DOWN FOR 30 MINUTES  3  . aspirin 81 MG EC tablet Take 81 mg by mouth daily.     . BD AUTOSHIELD DUO 30G X 5 MM MISC     . BD PEN NEEDLE NANO U/F 32G X 4 MM MISC     . clindamycin (CLEOCIN T) 1 % external solution Apply 1 application topically 2 (two) times daily.  2  . clobetasol (TEMOVATE) 0.05 % external solution Apply 1 application topically every evening. (apply to scalp)    . colchicine 0.6 MG tablet Take 2 tabs PO x 1, then 1 tab PO 1 hour later x 1  Max: 1.8 mg total dose per attack, do not repeat within 3 days. 10 tablet 0  . Ergocalciferol (VITAMIN D2) 2000 units TABS Take 1 tablet by mouth daily.    . feeding supplement, ENSURE ENLIVE, (ENSURE ENLIVE) LIQD Take 237 mLs by mouth 2 (two) times daily between meals. 237 mL 12  . ferrous fumarate (HEMOCYTE - 106 MG FE) 325 (106 Fe) MG TABS tablet Take 1 tablet (106 mg of iron total) by mouth 2 (two) times daily. 60 each 0  . glipiZIDE (GLUCOTROL) 10 MG tablet Take 10 mg by mouth daily.     . hydrOXYzine (ATARAX/VISTARIL) 10 MG tablet Take 10 mg by mouth at bedtime.  1  . Insulin Glargine (LANTUS SOLOSTAR) 100 UNIT/ML Solostar Pen Inject 36 Units into the skin daily. (may take up to 50u based upon blood glucose reading)    . Insulin Pen Needle (BD PEN NEEDLE NANO U/F) 32G X 4 MM MISC USE 1 NEEDLE SUBCUTANEOUSLY ONCE A DAY AS DIRECTED    . ketoconazole (NIZORAL) 2 % cream     . ketoconazole (NIZORAL) 2 % shampoo Apply 1 application topically daily. (apply to scalp and wait at least 30 minutes before rinsing)     . Lidocaine 4 % PTCH Place onto the skin.    Marland Kitchen loratadine (CLARITIN) 10 MG tablet Take 10 mg by mouth daily.    . magnesium oxide (MAG-OX) 400 MG tablet Take 800 mg by mouth daily.    . Magnesium Oxide 400 (240 Mg) MG TABS TAKE 2 TABLETS (800 MG TOTAL) BY MOUTH DAILY.  11  . methocarbamol (ROBAXIN) 500 MG tablet Take 500-1,500 mg by mouth See admin instructions. On day 1-3 take 1500 mg by mouth three times daily, on days 4-6 take 1000 mg by mouth three times daily, then take 1000 mg by mouth three times daily as needed.  0  . niacin (NIASPAN) 500 MG CR tablet Take 500 mg by mouth every evening.  2  . Omega-3 Fatty Acids (FISH OIL) 1000 MG CAPS Take 1 capsule by mouth.     . pantoprazole (PROTONIX) 40 MG tablet Take 1 tablet (40 mg total) by mouth daily. 30 tablet 0  . potassium chloride SA (K-DUR,KLOR-CON)  20 MEQ tablet Take 20 mEq by mouth 2 (two) times daily.    . simvastatin (ZOCOR) 20 MG tablet Take 20 mg by mouth at bedtime.  6  . sitaGLIPtin (JANUVIA) 50 MG tablet Take 50 mg by mouth daily.    . traMADol (ULTRAM) 50 MG tablet Take by mouth.    . traMADol (ULTRAM) 50 MG tablet Take 1 tablet (50 mg total) by mouth 2 (two) times daily. (Patient not taking: Reported on 06/13/2018) 10 tablet 0  . TRAVATAN Z 0.004 % SOLN ophthalmic solution Place 1 drop into the left eye at bedtime.    . vitamin B-12 (CYANOCOBALAMIN) 1000 MCG tablet Take 1,000 mcg by mouth daily.     No current facility-administered medications for this visit.     OBJECTIVE: There were no vitals filed for this visit.   There is no height or weight on file to calculate BMI.    ECOG FS:1 - Symptomatic but completely ambulatory  General: Well-developed, well-nourished, no acute distress. Eyes: Pink conjunctiva, anicteric sclera. HEENT: Normocephalic, moist mucous membranes. Lungs: Clear to auscultation bilaterally. Heart: Regular rate and rhythm. No rubs, murmurs, or gallops. Abdomen: Soft, nontender, nondistended. No  organomegaly noted, normoactive bowel sounds. Musculoskeletal: No edema, cyanosis, or clubbing. Neuro: Alert, answering all questions appropriately. Cranial nerves grossly intact. Skin: No rashes or petechiae noted. Psych: Normal affect.   LAB RESULTS:  Lab Results  Component Value Date   NA 138 05/04/2018   K 5.0 05/04/2018   CL 102 05/04/2018   CO2 26 05/04/2018   GLUCOSE 76 05/04/2018   BUN 16 05/04/2018   CREATININE 1.26 (H) 05/04/2018   CALCIUM 9.4 05/04/2018   PROT 8.2 (H) 03/17/2018   ALBUMIN 4.3 03/17/2018   AST 44 (H) 03/17/2018   ALT 23 03/17/2018   ALKPHOS 92 03/17/2018   BILITOT 1.4 (H) 03/17/2018   GFRNONAA 40 (L) 05/04/2018   GFRAA 46 (L) 05/04/2018    Lab Results  Component Value Date   WBC 6.5 06/30/2018   NEUTROABS 4.2 06/30/2018   HGB 11.5 (L) 06/30/2018   HCT 36.0 06/30/2018   MCV 96.8 06/30/2018   PLT 182 06/30/2018   Lab Results  Component Value Date   IRON 48 06/30/2018   TIBC 397 06/30/2018   IRONPCTSAT 12 06/30/2018   Lab Results  Component Value Date   FERRITIN 40 06/30/2018    STUDIES: No results found.  ASSESSMENT: Iron deficiency anemia.  PLAN:    1. Iron deficiency anemia: Secondary to recent GI bleed.  Patient had upper endoscopy on January 30, 2018.  She had a small bowel endoscopy and colonoscopy on February 22, 2018 that revealed multiple ulcerations that were subsequently treated with argon laser.  She denies any further melena or hematochezia.  Her hemoglobin has trended down slightly to 11.6, but is essentially stable.  Iron stores continue to be within normal limits.  No intervention is needed at this time.  Patient does not require IV Feraheme.  Return to clinic in 3 months with repeat laboratory work and further evaluation. 2. Left kidney mass: Continue follow-up with urology as indicated.  I spent a total of 20 minutes face-to-face with the patient of which greater than 50% of the visit was spent in counseling and  coordination of care as detailed above.   Patient expressed understanding and was in agreement with this plan. She also understands that She can call clinic at any time with any questions, concerns, or complaints.    Christia Reading  Bridget Hartshorn, MD 09/08/18 11:33 PM

## 2018-09-10 ENCOUNTER — Encounter: Payer: Self-pay | Admitting: Oncology

## 2018-09-10 ENCOUNTER — Other Ambulatory Visit: Payer: Self-pay | Admitting: Oncology

## 2018-09-10 DIAGNOSIS — L853 Xerosis cutis: Secondary | ICD-10-CM | POA: Diagnosis not present

## 2018-09-10 DIAGNOSIS — L218 Other seborrheic dermatitis: Secondary | ICD-10-CM | POA: Diagnosis not present

## 2018-09-12 ENCOUNTER — Inpatient Hospital Stay: Payer: Medicare HMO | Admitting: Oncology

## 2018-09-12 ENCOUNTER — Inpatient Hospital Stay: Payer: Medicare HMO

## 2018-09-12 DIAGNOSIS — N183 Chronic kidney disease, stage 3 (moderate): Secondary | ICD-10-CM | POA: Diagnosis not present

## 2018-09-12 DIAGNOSIS — E785 Hyperlipidemia, unspecified: Secondary | ICD-10-CM | POA: Diagnosis not present

## 2018-09-12 DIAGNOSIS — Z794 Long term (current) use of insulin: Secondary | ICD-10-CM | POA: Diagnosis not present

## 2018-09-12 DIAGNOSIS — M810 Age-related osteoporosis without current pathological fracture: Secondary | ICD-10-CM | POA: Diagnosis not present

## 2018-09-12 DIAGNOSIS — E1142 Type 2 diabetes mellitus with diabetic polyneuropathy: Secondary | ICD-10-CM | POA: Diagnosis not present

## 2018-09-12 DIAGNOSIS — E1122 Type 2 diabetes mellitus with diabetic chronic kidney disease: Secondary | ICD-10-CM | POA: Diagnosis not present

## 2018-09-12 DIAGNOSIS — E1169 Type 2 diabetes mellitus with other specified complication: Secondary | ICD-10-CM | POA: Diagnosis not present

## 2018-09-16 DIAGNOSIS — Z794 Long term (current) use of insulin: Secondary | ICD-10-CM | POA: Insufficient documentation

## 2018-09-25 ENCOUNTER — Telehealth: Payer: Self-pay | Admitting: Gastroenterology

## 2018-09-25 NOTE — Telephone Encounter (Signed)
I r/s patient from 09-29-18 to 12-25-2018 due to the Coronavirus per Dr Marius Ditch 2-3 months out.

## 2018-09-29 ENCOUNTER — Ambulatory Visit: Payer: Medicare HMO | Admitting: Gastroenterology

## 2018-12-19 ENCOUNTER — Ambulatory Visit: Payer: Medicare HMO | Admitting: Urology

## 2018-12-24 ENCOUNTER — Ambulatory Visit: Payer: Medicare HMO | Admitting: Urology

## 2018-12-25 ENCOUNTER — Ambulatory Visit: Payer: Medicare HMO | Admitting: Gastroenterology

## 2019-01-08 ENCOUNTER — Ambulatory Visit
Admission: RE | Admit: 2019-01-08 | Discharge: 2019-01-08 | Disposition: A | Discharge status: Home / Self Care | Payer: Medicare HMO | Source: Ambulatory Visit | Attending: Urology | Admitting: Urology

## 2019-01-08 ENCOUNTER — Other Ambulatory Visit: Payer: Self-pay

## 2019-01-08 ENCOUNTER — Encounter (INDEPENDENT_AMBULATORY_CARE_PROVIDER_SITE_OTHER): Payer: Self-pay

## 2019-01-08 DIAGNOSIS — N2889 Other specified disorders of kidney and ureter: Secondary | ICD-10-CM | POA: Diagnosis not present

## 2019-01-16 ENCOUNTER — Other Ambulatory Visit: Payer: Self-pay

## 2019-01-16 ENCOUNTER — Ambulatory Visit (INDEPENDENT_AMBULATORY_CARE_PROVIDER_SITE_OTHER): Payer: Medicare HMO | Admitting: Urology

## 2019-01-16 ENCOUNTER — Encounter: Payer: Self-pay | Admitting: Urology

## 2019-01-16 VITALS — BP 147/67 | HR 96 | Ht 64.0 in | Wt 184.0 lb

## 2019-01-16 DIAGNOSIS — M069 Rheumatoid arthritis, unspecified: Secondary | ICD-10-CM | POA: Diagnosis not present

## 2019-01-16 DIAGNOSIS — N2889 Other specified disorders of kidney and ureter: Secondary | ICD-10-CM

## 2019-01-16 NOTE — Progress Notes (Signed)
01/16/2019 9:48 AM   [name] August 15, 1940 [id number]  Referring provider: Sofie Hartigan, MD [place],  [place] [address]  Chief Complaint  Patient presents with  . Renal mass    Follow up 9 month w/RUS    HPI: 78 year old female with multiple medical comorbidities who returns today for surveillance of an incidental left renal mass.  She was initially seen in 10/2016 at which time a 1.7 x 1.6 cm enhancing interpolar left renal mass was identified. She underwent follow-up CT abdomen with and without contrast on 01/30/2017 which showed the lesion had enlarged slightly to 1.9 x 1.8 cm which is most consistent with a Bosniak 3 lesion, primarily cystic with thick enhancing wall.  Renal ultrasound on 02/05/2018 shows mild interval increase in the lesion, now measuring 2 x 1.5 x 1.8 cm which has a more solid appearance than previous.  This is in comparison to a renal ultrasound on from 07/2017 at which time the lesion measured 1.3 x 1.2 x 1.4 cm described as s complex exophytic midpole partially cystic structure on the kidney.  She had an interval CT scan during an admission 9/19 which showed unchanged mass.  Interval renal ultrasound on 01/08/2019 shows slight interval growth of the lesion measuring 2.3 x 1.9 x 1.8 (previously 2 x 1.5 x 1.8 cm).  This was not appreciated on previous scan on 11/2012.  She denies any weight loss or flank pain.  PMH: Past Medical History:  Diagnosis Date  . Cancer [facility]    breast cancer right mastectomy  . Chronic kidney disease   . Diabetes mellitus without complication ([place])   . GIB (gastrointestinal bleeding) 02/20/2018  . Glaucoma   . Hyperlipidemia   . Hypertension   . Neuropathy   . Osteoarthritis   . Rheumatoid arthritis ([place])   . Telangiectasias     Surgical History: Past Surgical History:  Procedure Laterality Date  . BREAST SURGERY     right mastectomy  . CHOLECYSTECTOMY    . COLONOSCOPY    . COLONOSCOPY WITH  PROPOFOL N/A 08/14/2016   Procedure: COLONOSCOPY WITH PROPOFOL;  Surgeon: Lollie Sails, MD;  Location: [facility] ENDOSCOPY;  Service: Endoscopy;  Laterality: N/A;  . COLONOSCOPY WITH PROPOFOL N/A 02/22/2018   Procedure: COLONOSCOPY WITH PROPOFOL;  Surgeon: Jonathon Bellows, MD;  Location: [facility] ENDOSCOPY;  Service: Gastroenterology;  Laterality: N/A;  . ESOPHAGOGASTRODUODENOSCOPY (EGD) WITH PROPOFOL N/A 08/14/2016   Procedure: ESOPHAGOGASTRODUODENOSCOPY (EGD) WITH PROPOFOL;  Surgeon: Lollie Sails, MD;  Location: [facility] ENDOSCOPY;  Service: Endoscopy;  Laterality: N/A;  . ESOPHAGOGASTRODUODENOSCOPY (EGD) WITH PROPOFOL N/A 01/30/2018   Procedure: ESOPHAGOGASTRODUODENOSCOPY (EGD) WITH PROPOFOL;  Surgeon: Tol[place], Benay Pike, MD;  Location: [facility] ENDOSCOPY;  Service: Gastroenterology;  Laterality: N/A;  . ESOPHAGOGASTRODUODENOSCOPY (EGD) WITH PROPOFOL N/A 02/22/2018   Procedure: ESOPHAGOGASTRODUODENOSCOPY (EGD) WITH PROPOFOL;  Surgeon: Jonathon Bellows, MD;  Location: [facility] ENDOSCOPY;  Service: Gastroenterology;  Laterality: N/A;  . EXCISIONAL HEMORRHOIDECTOMY    . KYPHOPLASTY N/A 03/20/2018   Procedure: [id number];  Surgeon: Hessie Knows, MD;  Location: [facility] ORS;  Service: Orthopedics;  Laterality: N/A;    Home Medications:  Allergies as of 01/16/2019   No Known Allergies     Medication List       Accurate as of January 16, 2019 11:59 PM. If you have any questions, ask your nurse or doctor.        Accu-Chek Aviva Plus test strip Generic drug: glucose blood   Accu-Chek Softclix Lancets lancets once daily  Use as instructed.   Accu-Chek Softclix Lancets lancets ONCE DAILY USE AS INSTRUCTED.   alendronate 70 MG tablet Commonly known as: FOSAMAX TAKE 1 TABLET BY MOUTH EVERY 7DAYS WITH A FULL GLASS OF WATER. DO NOT LIE DOWN FOR 30 MINUTES   aspirin 81 MG EC tablet Take 81 mg by mouth daily.   BD AutoShield Duo 30G X 5 MM Misc Generic drug: Insulin Pen Needle   BD Pen Needle Nano U/F 32G X 4 MM  Misc Generic drug: Insulin Pen Needle USE 1 NEEDLE SUBCUTANEOUSLY ONCE A DAY AS DIRECTED   BD Pen Needle Nano U/F 32G X 4 MM Misc Generic drug: Insulin Pen Needle   clindamycin 1 % external solution Commonly known as: CLEOCIN T Apply 1 application topically 2 (two) times daily.   clobetasol 0.05 % external solution Commonly known as: TEMOVATE Apply 1 application topically every evening. (apply to scalp)   colchicine 0.6 MG tablet Take 2 tabs PO x 1, then 1 tab PO 1 hour later x 1  Max: 1.8 mg total dose per attack, do not repeat within 3 days.   feeding supplement (ENSURE ENLIVE) Liqd Take 237 mLs by mouth 2 (two) times daily between meals.   ferrous fumarate 325 (106 Fe) MG Tabs tablet Commonly known as: HEMOCYTE - 106 mg FE Take 1 tablet (106 mg of iron total) by mouth 2 (two) times daily.   Fish Oil 1000 MG Caps Take 1 capsule by mouth.   glipiZIDE 10 MG tablet Commonly known as: GLUCOTROL Take 10 mg by mouth daily.   hydrOXYzine 10 MG tablet Commonly known as: ATARAX/VISTARIL Take 10 mg by mouth at bedtime.   ketoconazole 2 % shampoo Commonly known as: NIZORAL Apply 1 application topically daily. (apply to scalp and wait at least 30 minutes before rinsing)   ketoconazole 2 % cream Commonly known as: NIZORAL   Lantus SoloStar 100 UNIT/ML Solostar Pen Generic drug: Insulin Glargine Inject 36 Units into the skin daily. (may take up to 50u based upon blood glucose reading)   Lidocaine 4 % Ptch Place onto the skin.   loratadine 10 MG tablet Commonly known as: CLARITIN Take 10 mg by mouth daily.   Magnesium Oxide 400 (240 Mg) MG Tabs TAKE 2 TABLETS (800 MG TOTAL) BY MOUTH DAILY.   magnesium oxide 400 MG tablet Commonly known as: MAG-OX Take 800 mg by mouth daily.   methocarbamol 500 MG tablet Commonly known as: ROBAXIN Take 500-1,500 mg by mouth See admin instructions. On day 1-3 take 1500 mg by mouth three times daily, on days 4-6 take 1000 mg by mouth  three times daily, then take 1000 mg by mouth three times daily as needed.   niacin 500 MG CR tablet Commonly known as: NIASPAN Take 500 mg by mouth every evening.   pantoprazole 40 MG tablet Commonly known as: Protonix Take 1 tablet (40 mg total) by mouth daily.   potassium chloride SA 20 MEQ tablet Commonly known as: K-DUR Take 20 mEq by mouth 2 (two) times daily.   simvastatin 20 MG tablet Commonly known as: ZOCOR Take 20 mg by mouth at bedtime.   sitaGLIPtin 50 MG tablet Commonly known as: JANUVIA Take 50 mg by mouth daily.   traMADol 50 MG tablet Commonly known as: ULTRAM Take by mouth.   traMADol 50 MG tablet Commonly known as: Ultram Take 1 tablet (50 mg total) by mouth 2 (two) times daily.   Travatan Z 0.004 % Soln ophthalmic solution Generic  drug: Travoprost (BAK Free) Place 1 drop into the left eye at bedtime.   vitamin B-12 1000 MCG tablet Commonly known as: CYANOCOBALAMIN Take 1,000 mcg by mouth daily.   Vitamin D2 50 MCG (2000 UT) Tabs Take 1 tablet by mouth daily.       Allergies: No Known Allergies  Family History: Family History  Problem Relation Age of Onset  . Kidney cancer Son   . Bladder Cancer Neg Hx     Social History:  reports that she quit smoking about 7 years ago. She has never used smokeless tobacco. She reports that she does not drink alcohol or use drugs.  ROS: UROLOGY Frequent Urination?: No Hard to postpone urination?: No Burning/pain with urination?: No Get up at night to urinate?: Yes Leakage of urine?: No Urine stream starts and stops?: No Trouble starting stream?: No Do you have to strain to urinate?: No Blood in urine?: No Urinary tract infection?: No Sexually transmitted disease?: No Injury to kidneys or bladder?: No Painful intercourse?: No Weak stream?: No Currently pregnant?: No Vaginal bleeding?: No Last menstrual period?: n  Gastrointestinal Nausea?: No Vomiting?: No Indigestion/heartburn?: No  Diarrhea?: No Constipation?: No  Constitutional Fever: No Night sweats?: No Weight loss?: No Fatigue?: No  Skin Skin rash/lesions?: No Itching?: Yes  Eyes Blurred vision?: No Double vision?: No  Ears/Nose/Throat Sore throat?: No Sinus problems?: No  Hematologic/Lymphatic Swollen glands?: No Easy bruising?: No  Cardiovascular Leg swelling?: No Chest pain?: No  Respiratory Cough?: No Shortness of breath?: No  Endocrine Excessive thirst?: No  Musculoskeletal Back pain?: Yes Joint pain?: Yes  Neurological Headaches?: No Dizziness?: No  Psychologic Depression?: No Anxiety?: No  Physical Exam: BP (!) 147/67   Pulse 96   Ht 5' 4"  (1.626 m)   Wt 184 lb (83.5 kg)   BMI 31.58 kg/m   Constitutional:  Alert and oriented, No acute distress.  Accompanied by son today. HEENT: [place] AT, moist mucus membranes.  Trachea midline, no masses. Cardiovascular: No clubbing, cyanosis, or edema. Respiratory: Normal respiratory effort, no increased work of breathing. Skin: No rashes, bruises or suspicious lesions. Neurologic: Grossly intact, no focal deficits, moving all 4 extremities. Psychiatric: Normal mood and affect.  Laboratory Data: Lab Results  Component Value Date   WBC 6.5 06/30/2018   HGB 11.5 (L) 06/30/2018   HCT 36.0 06/30/2018   MCV 96.8 06/30/2018   PLT 182 06/30/2018    Lab Results  Component Value Date   CREATININE 1.26 (H) 05/04/2018    Lab Results  Component Value Date   HGBA1C 6.3 (H) 01/29/2018    Pertinent Imaging: Results for orders placed during the hospital encounter of 01/08/19  Ultrasound renal complete   Narrative CLINICAL DATA:  Renal mass follow-up  EXAM: RENAL / URINARY TRACT ULTRASOUND COMPLETE  COMPARISON:  CT dated March 17, 2018. ultrasound dated February 05, 2018  FINDINGS: Right Kidney:  Renal measurements: 10.5 x 3.7 x 4.6 cm = volume: 93 mL. There is a prominent extrarenal pelvis. This appears somewhat more  dilated than on this CT from 2019 given differences in modality. No hydronephrosis.  Left Kidney:  Renal measurements: 11.1 x 4.3 x 4.8 cm = volume: 11.9 mL. There is a 2.3 x 1.9 x 1.8 cm solid exophytic mass arising from the upper pole. This previously measured 2 x 1.5 x 1.8 cm. There is no hydronephrosis.  Bladder:  Appears normal for degree of bladder distention.  IMPRESSION: 1. Slight interval growth of the left renal mass  currently measuring 2.3 x 1.9 x 1.8 cm (previously measuring 2 x 1.5 by 1.8 cm on February 05, 2018). This remains concerning for renal cell carcinoma until proven otherwise. 2. Fullness of the right renal pelvis likely representing a prominent extrarenal pelvis. 3. Both ureteral jets were visualized.   Electronically Signed   By: Constance Holster M.D.   On: 01/08/2019 19:52    Renal ultrasound imaging was personally reviewed today.  This appears to be slightly enlarged compared to 1 year ago, 3 mm interval growth.  Assessment & Plan:    1. Renal mass Incidental left upper pole renal mass  Been serving this lesion.  Has had slight interval growth, approximately 3 mm/year.  At this growth rate, options continue to include surveillance, cryoablation versus surgery.  Given her multiple medical comorbidities, she is most interested in continued surveillance.  We will recheck the lesion in about 9 months.  We discussed if the lesion continues to progress or accelerating growth rate, she should strongly consider intervention while the lesion is still small.  She is most interested in cryotherapy the lesion continues accelerate.  We discussed the risk of metastatic disease, less than 5% for lesions less than 3 to 4 cm in size.  She understands all this and is agreeable to plan.  Plan for follow-up renal ultrasound in 9 months. - US RENAL; Future   Return in about 9 months (around 10/17/2019) for RUS.  Hollice Espy, MD  [facility]  [address], [place], [place] [address] [phone]

## 2019-01-28 ENCOUNTER — Ambulatory Visit (INDEPENDENT_AMBULATORY_CARE_PROVIDER_SITE_OTHER): Payer: Medicare HMO | Admitting: Gastroenterology

## 2019-01-28 ENCOUNTER — Other Ambulatory Visit: Payer: Self-pay

## 2019-01-28 ENCOUNTER — Encounter: Payer: Self-pay | Admitting: Gastroenterology

## 2019-01-28 VITALS — BP 148/72 | HR 93 | Temp 99.5°F | Resp 17 | Ht 64.0 in | Wt 190.8 lb

## 2019-01-28 DIAGNOSIS — K5520 Angiodysplasia of colon without hemorrhage: Secondary | ICD-10-CM

## 2019-01-28 DIAGNOSIS — K7581 Nonalcoholic steatohepatitis (NASH): Secondary | ICD-10-CM | POA: Diagnosis not present

## 2019-01-28 DIAGNOSIS — C229 Malignant neoplasm of liver, not specified as primary or secondary: Secondary | ICD-10-CM

## 2019-01-28 DIAGNOSIS — K7460 Unspecified cirrhosis of liver: Secondary | ICD-10-CM

## 2019-01-28 DIAGNOSIS — D500 Iron deficiency anemia secondary to blood loss (chronic): Secondary | ICD-10-CM

## 2019-01-28 NOTE — Progress Notes (Signed)
Emily Darby, MD [address]  [place]  [place], [place] [address]  Main: [phone]  Fax: [phone]    Gastroenterology Consultation  Referring Provider:     Sofie Hartigan, MD Primary Care Physician:  Sofie Hartigan, MD Primary Gastroenterologist:  Dr. Gustavo Lah Reason for Consultation:     Chronic iron deficiency anemia, AVMs        HPI:   Emily Faulkner is a 78 y.o. female referred by Dr. Ellison Hughs Emily Noa, MD  for consultation & management of chronic iron deficiency anemia.  Patient is functionally independent, has metabolic syndrome, with chronic iron deficiency anemia, on oral iron therapy, diabetes, hypertension, hyperlipidemia, cirrhosis of liver, well compensated who was admitted to [facility] on 02/21/2018 secondary to severe symptomatic anemia, hemoglobin 6.3 and melena.  She was also taking oral iron.  During this admission, patient underwent push enteroscopy as well as colonoscopy which revealed small bowel AVMs as well as several AVMs in the ascending colon and cecum which were all treated with APC.  Patient was discharged home on oral iron therapy  She was also admitted in last week of July secondary to severe symptomatic anemia, underwent EGD which was unremarkable except for mild portal hypertensive gastropathy. Patient previously underwent EGD and colonoscopy in 08/2016, revealed nonbleeding AVMs in the ascending colon, left untreated. She never had a video capsule endoscopy performed.  Interval summary: Patient reports doing well since discharge.  She continues to be on oral iron.  She reports that her stools interchange between yellow, brown and black.  She has a follow-up appointment with Dr. Grayland Ormond on Friday this week, also scheduled for pre-clinic labs.  She is accompanied by her son today.  She denies any other GI symptoms.  Follow-up visit 07/14/2017 She had an attack of gout which is now in remission.  Currently on colchicine.  She continues to  take oral iron.  She reports having dark formed stools on oral iron.  Last hemoglobin 11 on 05/04/2018.  Normal iron stores as of 03/2018 Patient reports that her fatigue wax and wanes but overall doing well  Follow-up visit 01/28/2019 Patient continues to gain weight significantly, diabetes has worsened, last hemoglobin A1c was 7.9 in 09/2018, went up from 05/2018.  Patient was consuming Ensure regularly, stopped when she found that her blood sugars were rising.  She loves eating ice cream, drinks coffee with creamer.  She is taking oral iron 2 pills, 3 times a week.  She reports her stools her symptoms black.  She reports having good energy levels.  Did not have any GI symptoms today.  She is accompanied by her son today  NSAIDs: None  Antiplts/Anticoagulants/Anti thrombotics: Aspirin 81  GI Procedures:   Small bowel endoscopy 02/22/2018 - Three non-bleeding angioectasias in the jejunum. Treated with argon plasma coagulation (APC). - Normal esophagus. - Normal stomach. - No specimens collected.  Colonoscopy 02/22/2018 - Preparation of the colon was fair. - Multiple bleeding colonic angioectasias. Treated with argon plasma coagulation (APC). - The examination was otherwise normal on direct and retroflexion views. - No specimens collected.  EGD 01/30/2018 by Dr. Alice Reichert - Widely patent and non-obstructing Schatzki ring. - Portal hypertensive gastropathy. - Normal examined duodenum. - The examination was otherwise normal. - No specimens collected.  EGD and colonoscopy 08/2016 by Dr. Gustavo Lah for iron deficiency anemia AVMs, nonbleeding in ascending colon were detected, not treated DIAGNOSIS:  A. STOMACH, ANTRUM AND BODY; COLD BIOPSY:  - ANTRAL AND OXYNTIC MUCOSA  WITH MODERATE CHRONIC ACTIVE GASTRITIS,  HELICOBACTER PYLORI ASSOCIATED.  - NEGATIVE FOR DYSPLASIA AND MALIGNANCY.   B. GE JUNCTION; COLD BIOPSY:  - REFLUX GASTROESOPHAGITIS.  - NEGATIVE FOR GOBLET CELLS, DYSPLASIA AND  MALIGNANCY.   C. COLON, RANDOM RIGHT; COLD BIOPSY:  - NO PATHOLOGIC CHANGE.   D. COLON, RANDOM LEFT; COLD BIOPSY:  - NO PATHOLOGIC CHANGE.   E. RECTUM POLYP; COLD BIOPSY:  - POLYPOID SQUAMOCOLUMNAR (ANORECTAL) MUCOSA.  - PROMINENT LYMPHOID AGGREGATE.  - NEGATIVE FOR DYSPLASIA AND MALIGNANCY.  - MULTIPLE DEEPER LEVELS WERE EXAMINED.   Past Medical History:  Diagnosis Date  . Cancer [facility])    breast cancer right mastectomy  . Chronic kidney disease   . Diabetes mellitus without complication ([place])   . GIB (gastrointestinal bleeding) 02/20/2018  . Glaucoma   . Hyperlipidemia   . Hypertension   . Neuropathy   . Osteoarthritis   . Rheumatoid arthritis ([place])   . Telangiectasias     Past Surgical History:  Procedure Laterality Date  . BREAST SURGERY     right mastectomy  . CHOLECYSTECTOMY    . COLONOSCOPY    . COLONOSCOPY WITH PROPOFOL N/A 08/14/2016   Procedure: COLONOSCOPY WITH PROPOFOL;  Surgeon: Lollie Sails, MD;  Location: [facility] ENDOSCOPY;  Service: Endoscopy;  Laterality: N/A;  . COLONOSCOPY WITH PROPOFOL N/A 02/22/2018   Procedure: COLONOSCOPY WITH PROPOFOL;  Surgeon: Jonathon Bellows, MD;  Location: [facility] ENDOSCOPY;  Service: Gastroenterology;  Laterality: N/A;  . ESOPHAGOGASTRODUODENOSCOPY (EGD) WITH PROPOFOL N/A 08/14/2016   Procedure: ESOPHAGOGASTRODUODENOSCOPY (EGD) WITH PROPOFOL;  Surgeon: Lollie Sails, MD;  Location: [facility] ENDOSCOPY;  Service: Endoscopy;  Laterality: N/A;  . ESOPHAGOGASTRODUODENOSCOPY (EGD) WITH PROPOFOL N/A 01/30/2018   Procedure: ESOPHAGOGASTRODUODENOSCOPY (EGD) WITH PROPOFOL;  Surgeon: Toledo, Benay Pike, MD;  Location: [facility] ENDOSCOPY;  Service: Gastroenterology;  Laterality: N/A;  . ESOPHAGOGASTRODUODENOSCOPY (EGD) WITH PROPOFOL N/A 02/22/2018   Procedure: ESOPHAGOGASTRODUODENOSCOPY (EGD) WITH PROPOFOL;  Surgeon: Jonathon Bellows, MD;  Location: [facility] ENDOSCOPY;  Service: Gastroenterology;  Laterality: N/A;  . EXCISIONAL HEMORRHOIDECTOMY    . KYPHOPLASTY  N/A 03/20/2018   Procedure: [id number];  Surgeon: Hessie Knows, MD;  Location: [facility] ORS;  Service: Orthopedics;  Laterality: N/A;    Current Outpatient Medications:  .  ACCU-CHEK AVIVA PLUS test strip, , Disp: , Rfl:  .  ACCU-CHEK SOFTCLIX LANCETS lancets, once daily Use as instructed., Disp: , Rfl:  .  ACCU-CHEK SOFTCLIX LANCETS lancets, ONCE DAILY USE AS INSTRUCTED., Disp: , Rfl: 5 .  alendronate (FOSAMAX) 70 MG tablet, TAKE 1 TABLET BY MOUTH EVERY 7DAYS WITH A FULL GLASS OF WATER. DO NOT LIE DOWN FOR 30 MINUTES, Disp: , Rfl: 3 .  aspirin 81 MG EC tablet, Take 81 mg by mouth daily. , Disp: , Rfl:  .  BD AUTOSHIELD DUO 30G X 5 MM MISC, , Disp: , Rfl:  .  BD PEN NEEDLE NANO U/F 32G X 4 MM MISC, , Disp: , Rfl:  .  colchicine 0.6 MG tablet, Take 2 tabs PO x 1, then 1 tab PO 1 hour later x 1  Max: 1.8 mg total dose per attack, do not repeat within 3 days., Disp: 10 tablet, Rfl: 0 .  Ergocalciferol (VITAMIN D2) 2000 units TABS, Take 1 tablet by mouth daily., Disp: , Rfl:  .  feeding supplement, ENSURE ENLIVE, (ENSURE ENLIVE) LIQD, Take 237 mLs by mouth 2 (two) times daily between meals., Disp: 237 mL, Rfl: 12 .  ferrous fumarate (HEMOCYTE - 106 MG FE) 325 (106 Fe) MG  TABS tablet, Take 1 tablet (106 mg of iron total) by mouth 2 (two) times daily., Disp: 60 each, Rfl: 0 .  glipiZIDE (GLUCOTROL) 10 MG tablet, Take 10 mg by mouth daily. , Disp: , Rfl:  .  hydrOXYzine (ATARAX/VISTARIL) 10 MG tablet, Take 10 mg by mouth at bedtime., Disp: , Rfl: 1 .  Insulin Glargine (LANTUS SOLOSTAR) 100 UNIT/ML Solostar Pen, Inject 36 Units into the skin daily. (may take up to 50u based upon blood glucose reading), Disp: , Rfl:  .  Insulin Pen Needle (BD PEN NEEDLE NANO U/F) 32G X 4 MM MISC, USE 1 NEEDLE SUBCUTANEOUSLY ONCE A DAY AS DIRECTED, Disp: , Rfl:  .  ketoconazole (NIZORAL) 2 % cream, , Disp: , Rfl:  .  ketoconazole (NIZORAL) 2 % shampoo, Apply 1 application topically daily. (apply to scalp and wait at  least 30 minutes before rinsing), Disp: , Rfl:  .  losartan (COZAAR) 50 MG tablet, TAKE 1/2 TABLET (25MG) BY MOUTH EVERY DAY, Disp: , Rfl:  .  magnesium oxide (MAG-OX) 400 MG tablet, Take 800 mg by mouth daily., Disp: , Rfl:  .  niacin (NIASPAN) 500 MG CR tablet, Take 500 mg by mouth every evening., Disp: , Rfl: 2 .  Omega-3 Fatty Acids (FISH OIL) 1000 MG CAPS, Take 1 capsule by mouth. , Disp: , Rfl:  .  pantoprazole (PROTONIX) 40 MG tablet, Take 1 tablet (40 mg total) by mouth daily., Disp: 30 tablet, Rfl: 0 .  potassium chloride SA (K-DUR,KLOR-CON) 20 MEQ tablet, Take 20 mEq by mouth 2 (two) times daily., Disp: , Rfl:  .  simvastatin (ZOCOR) 20 MG tablet, Take 20 mg by mouth at bedtime., Disp: , Rfl: 6 .  sitaGLIPtin (JANUVIA) 50 MG tablet, Take 50 mg by mouth daily., Disp: , Rfl:  .  TRAVATAN Z 0.004 % SOLN ophthalmic solution, Place 1 drop into the left eye at bedtime., Disp: , Rfl:  .  vitamin B-12 (CYANOCOBALAMIN) 1000 MCG tablet, Take 1,000 mcg by mouth daily., Disp: , Rfl:  .  clindamycin (CLEOCIN T) 1 % external solution, Apply 1 application topically 2 (two) times daily., Disp: , Rfl: 2 .  clobetasol (TEMOVATE) 0.05 % external solution, Apply 1 application topically every evening. (apply to scalp), Disp: , Rfl:  .  Lidocaine 4 % PTCH, Place onto the skin., Disp: , Rfl:  .  loratadine (CLARITIN) 10 MG tablet, Take 10 mg by mouth daily., Disp: , Rfl:  .  Magnesium Oxide 400 (240 Mg) MG TABS, TAKE 2 TABLETS (800 MG TOTAL) BY MOUTH DAILY., Disp: , Rfl: 11 .  methocarbamol (ROBAXIN) 500 MG tablet, Take 500-1,500 mg by mouth See admin instructions. On day 1-3 take 1500 mg by mouth three times daily, on days 4-6 take 1000 mg by mouth three times daily, then take 1000 mg by mouth three times daily as needed., Disp: , Rfl: 0 .  traMADol (ULTRAM) 50 MG tablet, Take by mouth., Disp: , Rfl:  .  traMADol (ULTRAM) 50 MG tablet, Take 1 tablet (50 mg total) by mouth 2 (two) times daily. (Patient not  taking: Reported on 01/28/2019), Disp: 10 tablet, Rfl: 0   Family History  Problem Relation Age of Onset  . Kidney cancer Son   . Bladder Cancer Neg Hx      Social History   Tobacco Use  . Smoking status: Former Smoker    Quit date: 07/10/2011    Years since quitting: 7.5  . Smokeless tobacco: Never Used  Substance Use Topics  . Alcohol use: No  . Drug use: No    Allergies as of 01/28/2019  . (No Known Allergies)    Review of Systems:    All systems reviewed and negative except where noted in HPI.   Physical Exam:  BP (!) 148/72 (BP Location: Left Arm, Patient Position: Sitting, Cuff Size: Large)   Pulse 93   Temp 99.5 F (37.5 C)   Resp 17   Ht 5' 4"  (1.626 m)   Wt 190 lb 12.8 oz (86.5 kg)   BMI 32.75 kg/m  No LMP recorded. Patient is postmenopausal.  General:   Alert,  Well-developed, well-nourished, pleasant and cooperative in NAD Head:  Normocephalic and atraumatic. Eyes:  Sclera clear, no icterus.   Conjunctiva pink. Ears:  Normal auditory acuity. Nose:  No deformity, discharge, or lesions. Mouth:  No deformity or lesions,oropharynx pink & moist. Neck:  Supple; no masses or thyromegaly. Lungs:  Respirations even and unlabored.  Clear throughout to auscultation.   No wheezes, crackles, or rhonchi. No acute distress. Heart:  Regular rate and rhythm; no murmurs, clicks, rubs, or gallops. Abdomen:  Normal bowel sounds. Soft, obese, non-tender and non-distended without masses, hepatosplenomegaly or hernias noted.  No guarding or rebound tenderness.   Rectal: Not performed Msk:  Symmetrical without gross deformities. Good, equal movement & strength bilaterally. Pulses:  Normal pulses noted. Extremities:  No clubbing or edema.  No cyanosis. Neurologic:  Alert and oriented x3;  grossly normal neurologically. Skin:  Intact without significant lesions or rashes. No jaundice. Psych:  Alert and cooperative. Normal mood and affect.  Imaging Studies: Reviewed   Assessment and Plan:   MELINNA LINAREZ is a 78 y.o. Caucasian female with metabolic syndrome, well compensated cirrhosis, probably NASH in etiology, chronic iron deficiency anemia secondary to chronic blood loss from small bowel and colonic AVMs.  She underwent small bowel endoscopy and colonoscopy, AVMs in jejunum, AC and cecum that were detected were cauterized  Iron deficiency anemia secondary to chronic blood loss from small bowel and colonic AVMs -Check labs today -Most recent ferritin levels were normal as of 03/2018 -No evidence of B12 or folate deficiency -I have discussed with her about video capsule endoscopy and possible balloon enteroscopy if there are more small bowel AVMs, however, patient would like to wait for next follow-up visit before undergoing this test  -Continue oral iron for now  Decompensated cirrhosis, probably NASH in etiology Portal hypertension: Manifested as mild portal hypertensive gastropathy and splenomegaly No evidence of varices based on EGD from 01/2018 Patient is euvolemic, no evidence of ascites HCC screening: Ultrasound liver in 07/2017 with no liver lesions.  Repeat ultrasound for [place] surveillance, ordered, check AFP levels PSE none  Follow up in 6 months   Emily Darby, MD

## 2019-01-29 LAB — CBC
Hematocrit: 35.5 % (ref 34.0–46.6)
Hemoglobin: 11.9 g/dL (ref 11.1–15.9)
MCH: 33 pg (ref 26.6–33.0)
MCHC: 33.5 g/dL (ref 31.5–35.7)
MCV: 98 fL — ABNORMAL HIGH (ref 79–97)
Platelets: 173 10*3/uL (ref 150–450)
RBC: 3.61 x10E6/uL — ABNORMAL LOW (ref 3.77–5.28)
RDW: 13.1 % (ref 11.7–15.4)
WBC: 7.8 10*3/uL (ref 3.4–10.8)

## 2019-01-29 LAB — COMPREHENSIVE METABOLIC PANEL
ALT: 22 IU/L (ref 0–32)
AST: 32 IU/L (ref 0–40)
Albumin/Globulin Ratio: 1.4 (ref 1.2–2.2)
Albumin: 4.4 g/dL (ref 3.7–4.7)
Alkaline Phosphatase: 75 IU/L (ref 39–117)
BUN/Creatinine Ratio: 10 — ABNORMAL LOW (ref 12–28)
BUN: 13 mg/dL (ref 8–27)
Bilirubin Total: 0.5 mg/dL (ref 0.0–1.2)
CO2: 19 mmol/L — ABNORMAL LOW (ref 20–29)
Calcium: 11.1 mg/dL — ABNORMAL HIGH (ref 8.7–10.3)
Chloride: 101 mmol/L (ref 96–106)
Creatinine, Ser: 1.25 mg/dL — ABNORMAL HIGH (ref 0.57–1.00)
GFR calc Af Amer: 48 mL/min/{1.73_m2} — ABNORMAL LOW (ref >59)
GFR calc non Af Amer: 42 mL/min/{1.73_m2} — ABNORMAL LOW (ref >59)
Globulin, Total: 3.2 g/dL (ref 1.5–4.5)
Glucose: 168 mg/dL — ABNORMAL HIGH (ref 65–99)
Potassium: 4.2 mmol/L (ref 3.5–5.2)
Sodium: 139 mmol/L (ref 134–144)
Total Protein: 7.6 g/dL (ref 6.0–8.5)

## 2019-01-29 LAB — FERRITIN: Ferritin: 61 ng/mL (ref 15–150)

## 2019-01-29 LAB — AFP TUMOR MARKER: AFP, Serum, Tumor Marker: 2.7 ng/mL (ref 0.0–8.3)

## 2019-02-02 ENCOUNTER — Ambulatory Visit: Payer: Medicare HMO

## 2019-02-02 DIAGNOSIS — E782 Mixed hyperlipidemia: Secondary | ICD-10-CM | POA: Diagnosis not present

## 2019-02-02 DIAGNOSIS — Z794 Long term (current) use of insulin: Secondary | ICD-10-CM | POA: Diagnosis not present

## 2019-02-02 DIAGNOSIS — E1140 Type 2 diabetes mellitus with diabetic neuropathy, unspecified: Secondary | ICD-10-CM | POA: Diagnosis not present

## 2019-02-02 DIAGNOSIS — M2550 Pain in unspecified joint: Secondary | ICD-10-CM | POA: Diagnosis not present

## 2019-02-02 DIAGNOSIS — N183 Chronic kidney disease, stage 3 (moderate): Secondary | ICD-10-CM | POA: Diagnosis not present

## 2019-02-02 DIAGNOSIS — N2889 Other specified disorders of kidney and ureter: Secondary | ICD-10-CM | POA: Diagnosis not present

## 2019-02-02 DIAGNOSIS — I10 Essential (primary) hypertension: Secondary | ICD-10-CM | POA: Diagnosis not present

## 2019-02-02 DIAGNOSIS — K7460 Unspecified cirrhosis of liver: Secondary | ICD-10-CM | POA: Diagnosis not present

## 2019-02-02 DIAGNOSIS — D508 Other iron deficiency anemias: Secondary | ICD-10-CM | POA: Diagnosis not present

## 2019-02-05 ENCOUNTER — Other Ambulatory Visit: Payer: Self-pay

## 2019-02-05 ENCOUNTER — Ambulatory Visit
Admission: RE | Admit: 2019-02-05 | Discharge: 2019-02-05 | Disposition: A | Discharge status: Home / Self Care | Payer: Medicare HMO | Source: Ambulatory Visit | Attending: Gastroenterology | Admitting: Gastroenterology

## 2019-02-05 DIAGNOSIS — C50919 Malignant neoplasm of unspecified site of unspecified female breast: Secondary | ICD-10-CM | POA: Diagnosis not present

## 2019-02-05 DIAGNOSIS — K7581 Nonalcoholic steatohepatitis (NASH): Secondary | ICD-10-CM | POA: Diagnosis not present

## 2019-02-05 DIAGNOSIS — K7460 Unspecified cirrhosis of liver: Secondary | ICD-10-CM | POA: Diagnosis not present

## 2019-02-05 DIAGNOSIS — C229 Malignant neoplasm of liver, not specified as primary or secondary: Secondary | ICD-10-CM | POA: Diagnosis not present

## 2019-02-05 DIAGNOSIS — K7469 Other cirrhosis of liver: Secondary | ICD-10-CM | POA: Diagnosis not present

## 2019-03-13 DIAGNOSIS — M810 Age-related osteoporosis without current pathological fracture: Secondary | ICD-10-CM | POA: Diagnosis not present

## 2019-03-13 DIAGNOSIS — E1142 Type 2 diabetes mellitus with diabetic polyneuropathy: Secondary | ICD-10-CM | POA: Diagnosis not present

## 2019-03-13 DIAGNOSIS — E785 Hyperlipidemia, unspecified: Secondary | ICD-10-CM | POA: Diagnosis not present

## 2019-03-13 DIAGNOSIS — E1169 Type 2 diabetes mellitus with other specified complication: Secondary | ICD-10-CM | POA: Diagnosis not present

## 2019-03-13 DIAGNOSIS — Z794 Long term (current) use of insulin: Secondary | ICD-10-CM | POA: Diagnosis not present

## 2019-04-06 DIAGNOSIS — E119 Type 2 diabetes mellitus without complications: Secondary | ICD-10-CM | POA: Diagnosis not present

## 2019-07-20 DIAGNOSIS — E1142 Type 2 diabetes mellitus with diabetic polyneuropathy: Secondary | ICD-10-CM | POA: Diagnosis not present

## 2019-07-20 DIAGNOSIS — Z794 Long term (current) use of insulin: Secondary | ICD-10-CM | POA: Diagnosis not present

## 2019-07-20 DIAGNOSIS — E785 Hyperlipidemia, unspecified: Secondary | ICD-10-CM | POA: Diagnosis not present

## 2019-07-20 DIAGNOSIS — E1169 Type 2 diabetes mellitus with other specified complication: Secondary | ICD-10-CM | POA: Diagnosis not present

## 2019-07-20 DIAGNOSIS — M810 Age-related osteoporosis without current pathological fracture: Secondary | ICD-10-CM | POA: Diagnosis not present

## 2019-07-20 DIAGNOSIS — E119 Type 2 diabetes mellitus without complications: Secondary | ICD-10-CM | POA: Diagnosis not present

## 2019-07-27 IMAGING — CT CT HEAD W/O CM
3 series · 15 of 46 positions shown, 18 images · non-contrast
Comparison: CT head 02/28/2006

CLINICAL DATA: Altered level of consciousness.

EXAM:
CT HEAD WITHOUT CONTRAST
TECHNIQUE: Contiguous axial images were obtained from the base of the skull
through the vertex without intravenous contrast.

[Series 2: head wo · axial · 0.47mm/px · z∈[-142,-22]mm · 9 of 29 slices shown, 12 images]
[im 3/29  brain]
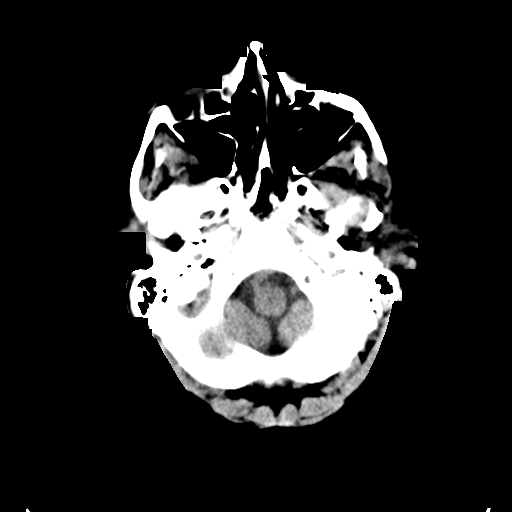
[im 3/29  bone]
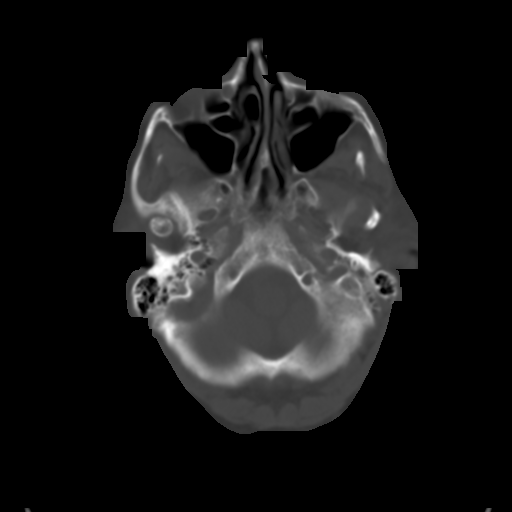
[im 6/29  brain]
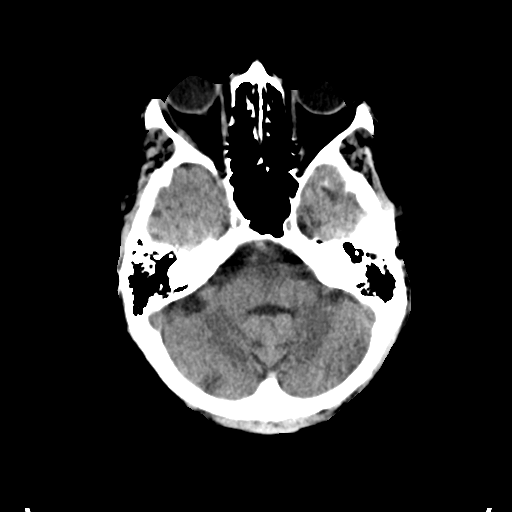
[im 9/29  brain]
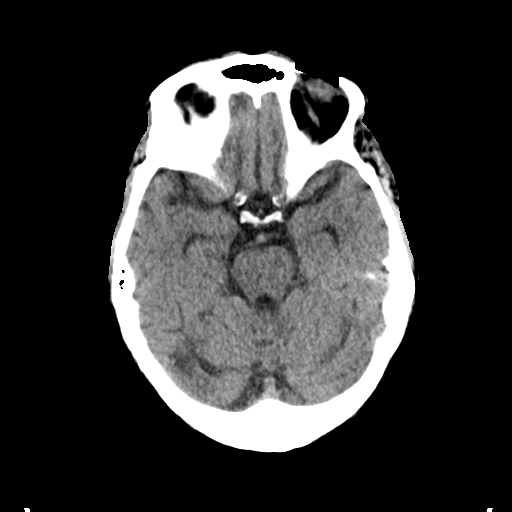
[im 12/29  brain]
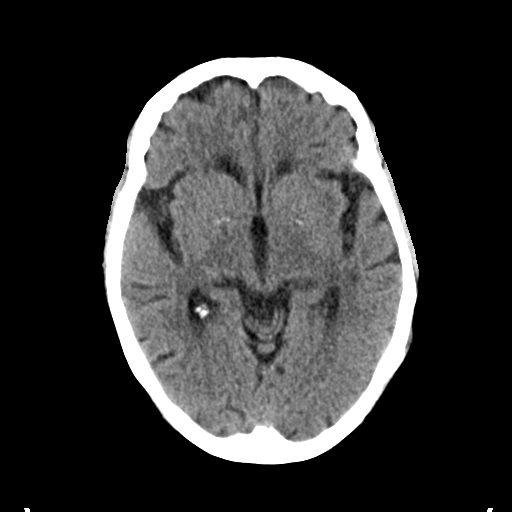
[im 15/29  brain]
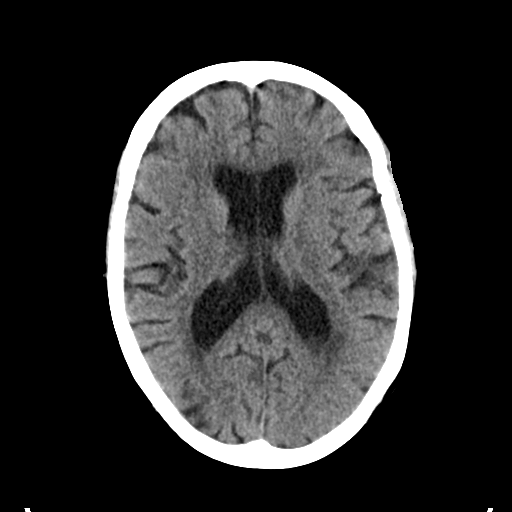
[im 15/29  bone]
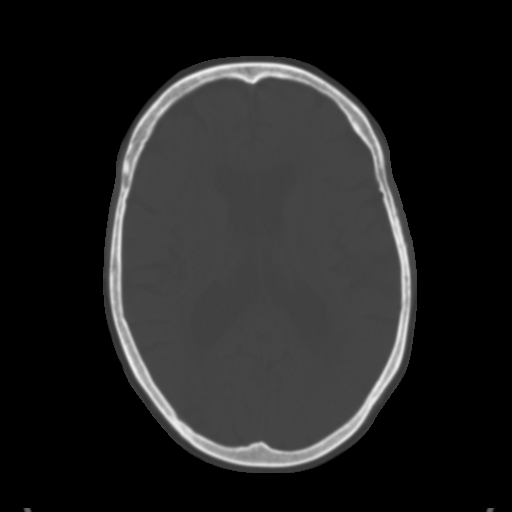
[im 18/29  brain]
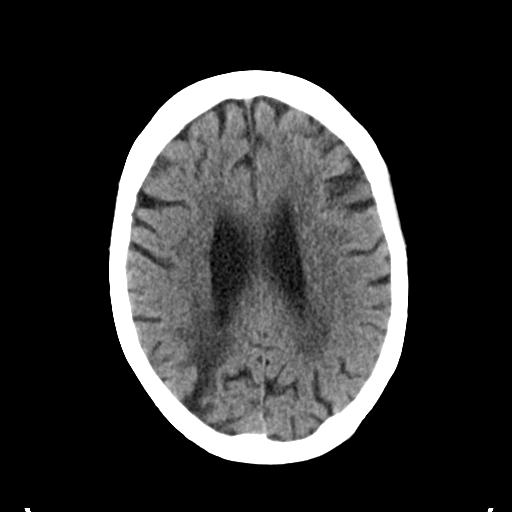
[im 21/29  brain]
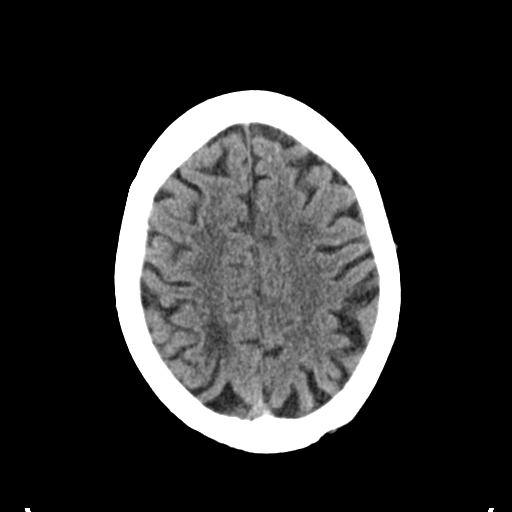
[im 24/29  brain]
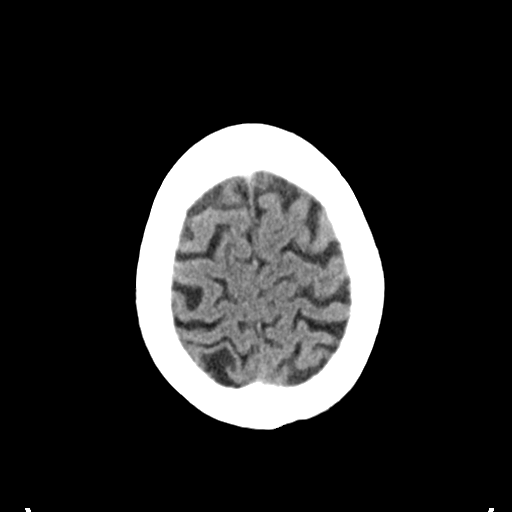
[im 27/29  brain]
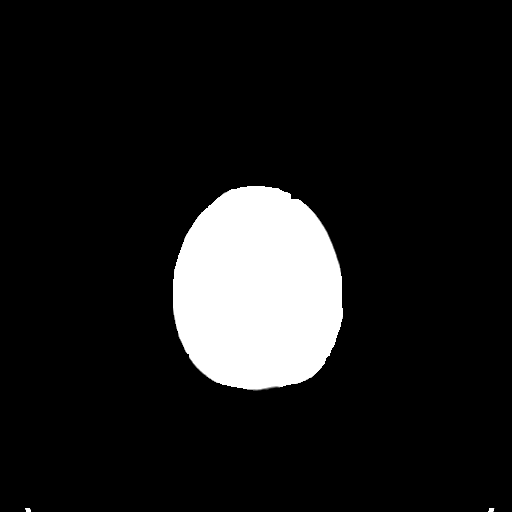
[im 27/29  bone]
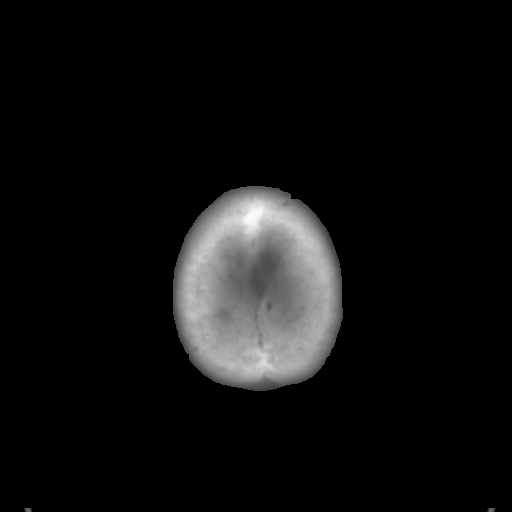

[Series 4: coronal soft tissue · coronal · 0.29mm/px · 3 of 66 slices shown]
[im 22/66  brain]
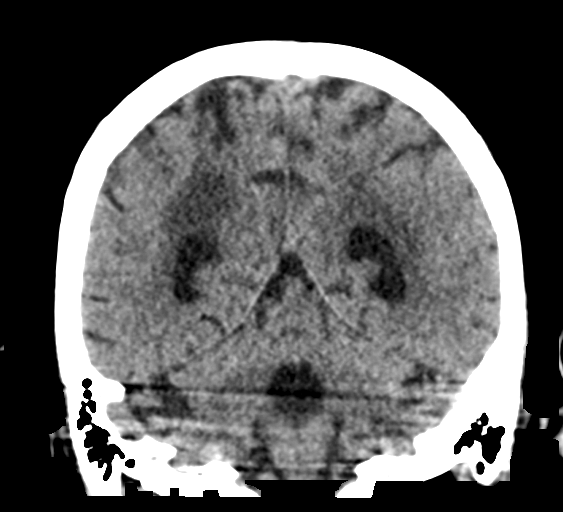
[im 29/66  brain]
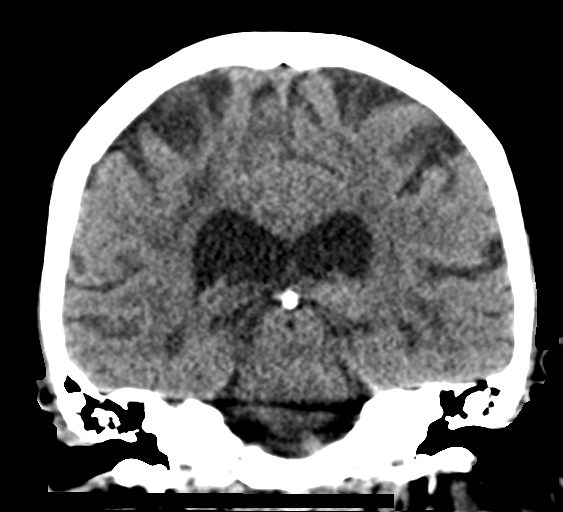
[im 37/66  brain]
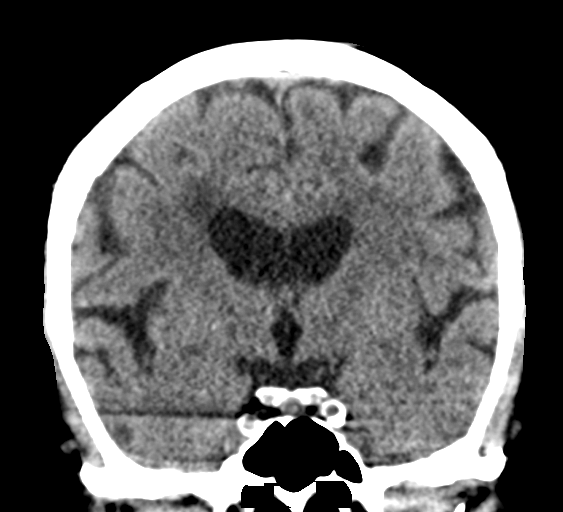

[Series 5: sagittal soft tissue · sagittal · 0.29mm/px · 3 of 53 slices shown]
[im 18/53  brain]
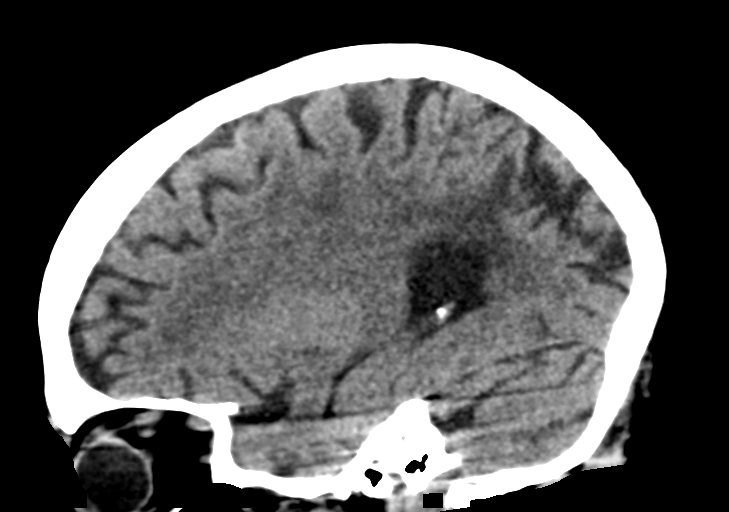
[im 27/53  brain]
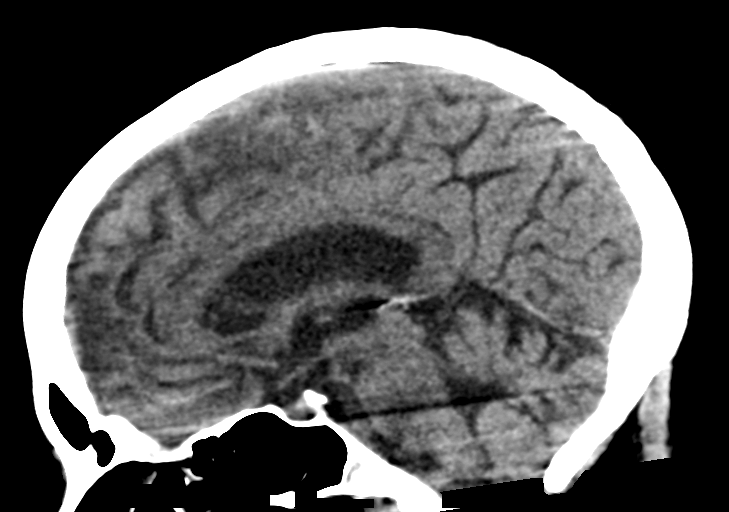
[im 35/53  brain]
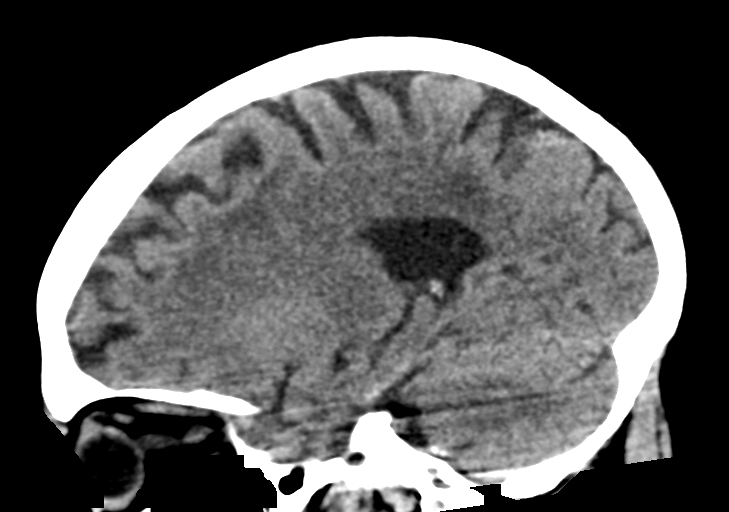

[15 of 46 positions shown; findings below may reference images not displayed]

FINDINGS: Brain: Generalized atrophy with progression. Chronic ischemic
changes have progressed. Chronic microvascular ischemia in the white
matter bilaterally. Chronic infarct in the right occipital and
parietal lobe.

Negative for acute infarct, hemorrhage, or mass lesion. No midline
shift.

Vascular: Negative for hyperdense vessel

Skull: Chronic fracture left parietal bone which was noted
previously. No acute skeletal abnormality

Sinuses/Orbits: Paranasal sinuses clear.  Bilateral cataract surgery

Other: None
IMPRESSION: Progression of atrophy and chronic ischemia since 5558. No acute
abnormality.

## 2019-07-28 IMAGING — DX DG CHEST 1V PORT
1 series · 1 of 1 positions shown · non-contrast
Comparison: In [DATE]

CLINICAL DATA: Shortness of breath, cough, melena, anemia.

EXAM:
PORTABLE CHEST 1 VIEW

[chest ap]
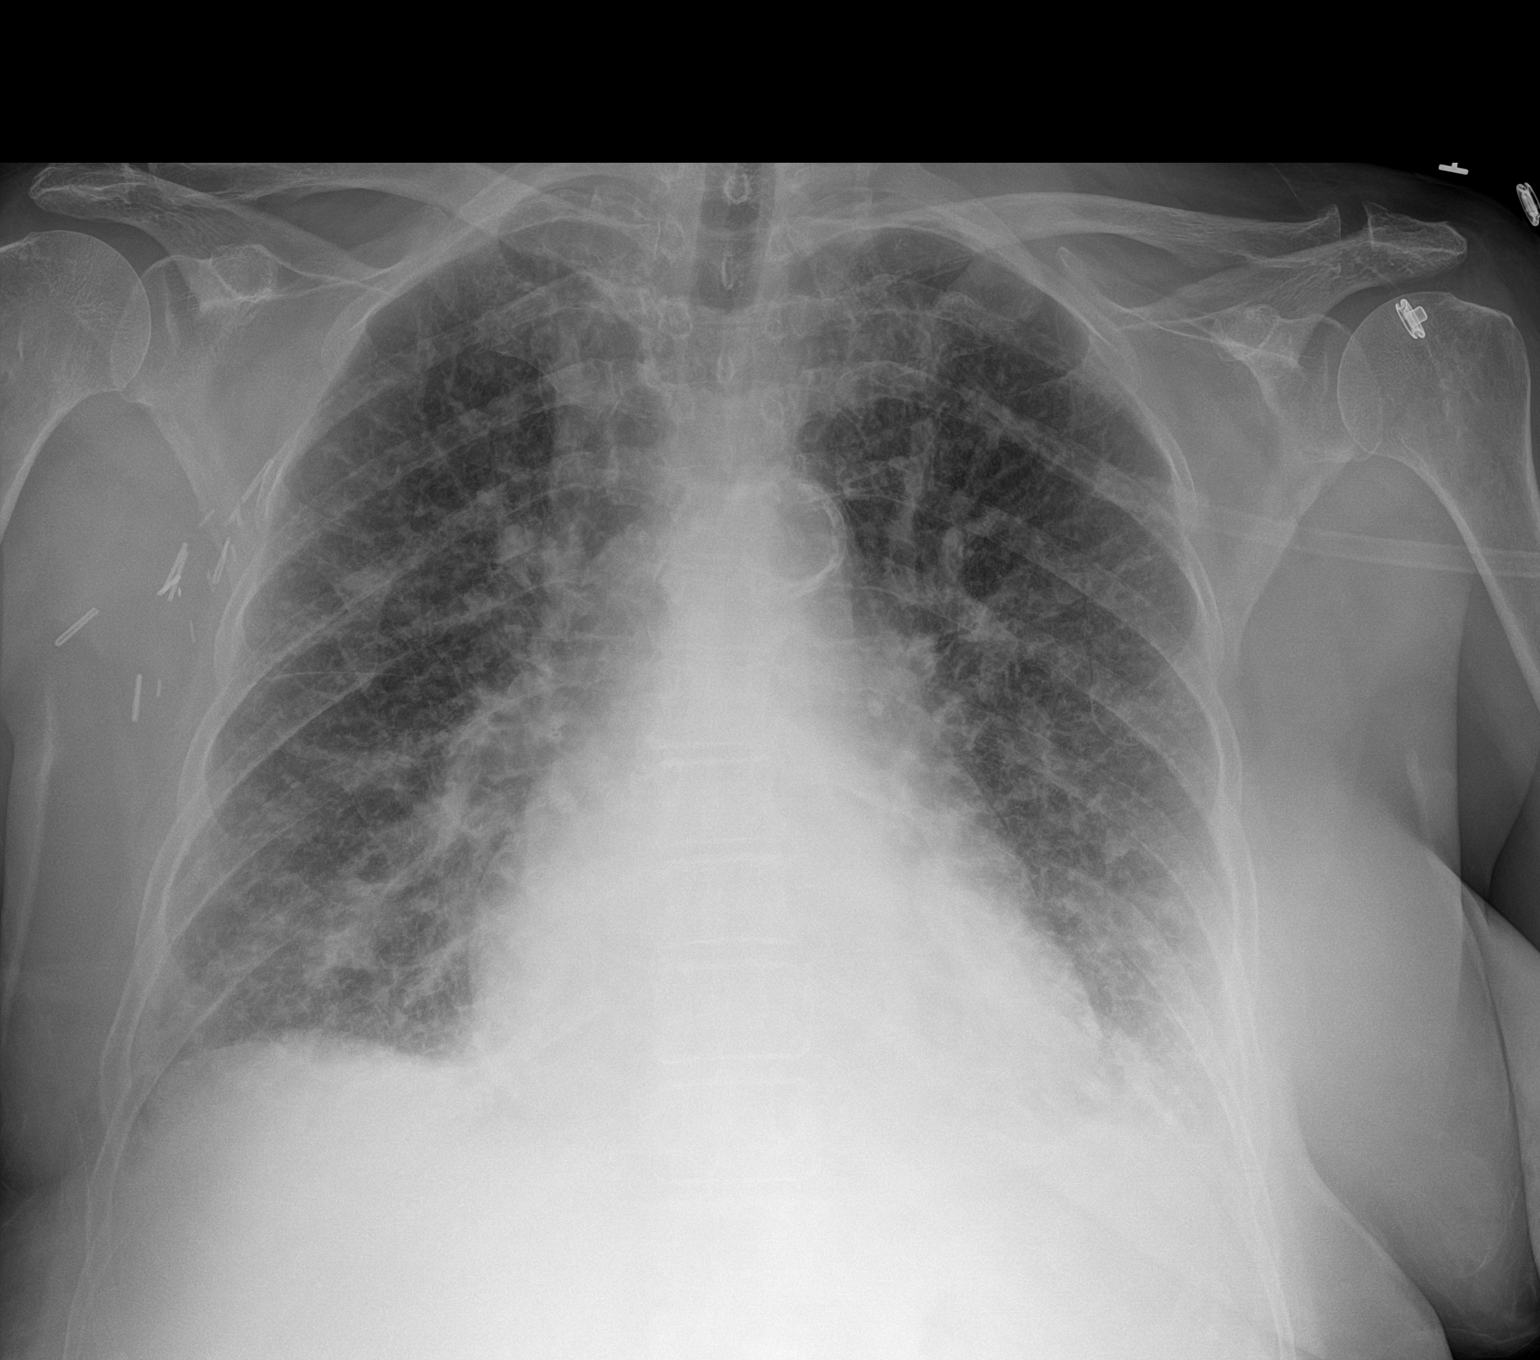

[1 of 1 positions shown; findings below may reference images not displayed]

FINDINGS: Stable top-normal heart size. Stable chronic appearance interstitial
lung disease and potential component of chronic interstitial edema
and pulmonary venous hypertension. There may be trace bilateral
pleural effusions. No focal airspace consolidation, nodule or
pneumothorax identified. The bony thorax is unremarkable.
IMPRESSION: Stable chronic interstitial lung disease and potential component of
chronic interstitial edema. Possible small bilateral pleural
effusions.

## 2019-08-03 DIAGNOSIS — L578 Other skin changes due to chronic exposure to nonionizing radiation: Secondary | ICD-10-CM | POA: Diagnosis not present

## 2019-08-03 DIAGNOSIS — L853 Xerosis cutis: Secondary | ICD-10-CM | POA: Diagnosis not present

## 2019-08-03 DIAGNOSIS — Z872 Personal history of diseases of the skin and subcutaneous tissue: Secondary | ICD-10-CM | POA: Diagnosis not present

## 2019-08-03 DIAGNOSIS — L281 Prurigo nodularis: Secondary | ICD-10-CM | POA: Diagnosis not present

## 2019-08-14 DIAGNOSIS — E1122 Type 2 diabetes mellitus with diabetic chronic kidney disease: Secondary | ICD-10-CM | POA: Diagnosis not present

## 2019-08-14 DIAGNOSIS — K7460 Unspecified cirrhosis of liver: Secondary | ICD-10-CM | POA: Diagnosis not present

## 2019-08-14 DIAGNOSIS — Z794 Long term (current) use of insulin: Secondary | ICD-10-CM | POA: Diagnosis not present

## 2019-08-14 DIAGNOSIS — D508 Other iron deficiency anemias: Secondary | ICD-10-CM | POA: Diagnosis not present

## 2019-08-14 DIAGNOSIS — N1832 Chronic kidney disease, stage 3b: Secondary | ICD-10-CM | POA: Diagnosis not present

## 2019-08-14 DIAGNOSIS — M069 Rheumatoid arthritis, unspecified: Secondary | ICD-10-CM | POA: Diagnosis not present

## 2019-08-14 DIAGNOSIS — E1169 Type 2 diabetes mellitus with other specified complication: Secondary | ICD-10-CM | POA: Diagnosis not present

## 2019-08-14 DIAGNOSIS — E1140 Type 2 diabetes mellitus with diabetic neuropathy, unspecified: Secondary | ICD-10-CM | POA: Diagnosis not present

## 2019-08-14 DIAGNOSIS — E785 Hyperlipidemia, unspecified: Secondary | ICD-10-CM | POA: Diagnosis not present

## 2019-08-14 DIAGNOSIS — Z6831 Body mass index (BMI) 31.0-31.9, adult: Secondary | ICD-10-CM | POA: Diagnosis not present

## 2019-08-14 DIAGNOSIS — E1142 Type 2 diabetes mellitus with diabetic polyneuropathy: Secondary | ICD-10-CM | POA: Diagnosis not present

## 2019-08-14 DIAGNOSIS — E669 Obesity, unspecified: Secondary | ICD-10-CM | POA: Diagnosis not present

## 2019-08-14 DIAGNOSIS — I129 Hypertensive chronic kidney disease with stage 1 through stage 4 chronic kidney disease, or unspecified chronic kidney disease: Secondary | ICD-10-CM | POA: Diagnosis not present

## 2019-08-21 IMAGING — CR DG C-ARM 61-120 MIN
1 series · 1 of 1 positions shown · non-contrast
Comparison: Body CT 03/17/2018

CLINICAL DATA: Post kyphoplasty.

EXAM:
THORACIC SPINE 2 VIEWS; DG C-ARM 61-120 MIN

[cont.]
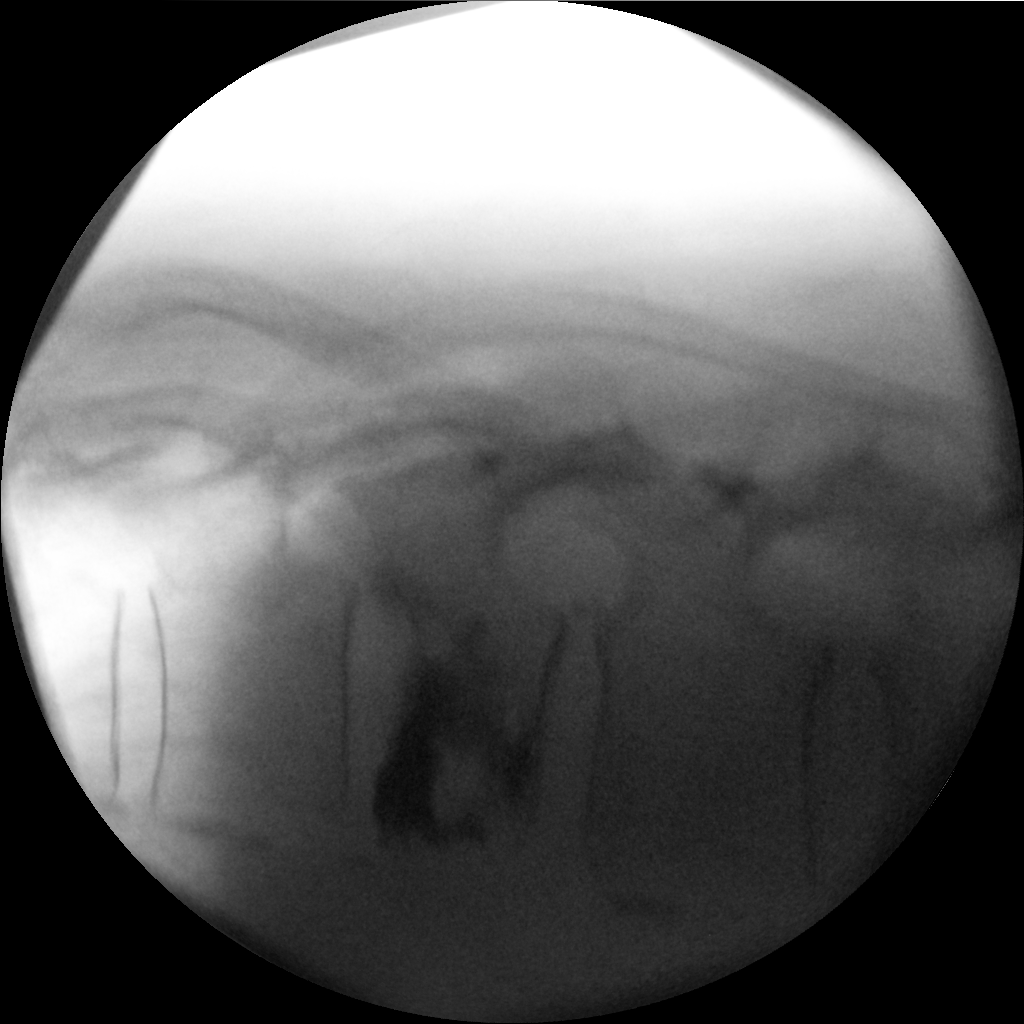

[1 of 1 positions shown; findings below may reference images not displayed]

FINDINGS: Fluoroscopic images of the thoraco lumbar junction demonstrate
interval kyphoplasty of T12 vertebral body with improvement in the
previously demonstrated height loss of the superior endplate.

The fluoroscopy time is recorded as 1 minutes 39 seconds
IMPRESSION: Kyphoplasty of T12 vertebral body with improvement in the previously
demonstrated height loss of the superior endplate.

## 2019-10-19 ENCOUNTER — Encounter (INDEPENDENT_AMBULATORY_CARE_PROVIDER_SITE_OTHER): Payer: Self-pay

## 2019-10-19 ENCOUNTER — Other Ambulatory Visit: Payer: Self-pay

## 2019-10-19 ENCOUNTER — Ambulatory Visit
Admission: RE | Admit: 2019-10-19 | Discharge: 2019-10-19 | Disposition: A | Discharge status: Home / Self Care | Payer: Medicare HMO | Source: Ambulatory Visit | Attending: Urology | Admitting: Urology

## 2019-10-19 DIAGNOSIS — N2889 Other specified disorders of kidney and ureter: Secondary | ICD-10-CM | POA: Diagnosis not present

## 2019-10-22 NOTE — Progress Notes (Signed)
10/23/19 2:23 PM   Emily Faulkner 30-Jul-1940 [id number]  Referring provider: Sofie Hartigan, MD [place],  [place] [address]  Chief Complaint  Patient presents with  . Results    Renal Mass    HPI: Emily Faulkner is a 79 y.o. F with multiple medical comorbidities who returns today for surveillance of an incidental left renal mass.  She was initially seen in 10/2016 at which time a 1.7 x 1.6 cm enhancing interpolar left renal mass was identified. She underwent follow-up CT abdomen with and without contrast on 01/30/2017 which showed the lesion had enlarged slightly to 1.9 x 1.8 cm which is most consistent with a Bosniak 3 lesion, primarily cystic with thick enhancing wall.  Renal ultrasound on 02/05/2018 shows mild interval increase in the lesion, now measuring 2 x 1.5 x 1.8 cm which has a more solid appearance than previous. This is in comparison to a renal ultrasound on from 07/2017 at which time the lesion measured1.3 x 1.2 x 1.4 cmdescribed asscomplex exophytic midpole partially cystic structure on the kidney.  She had an interval CT scan during an admission 9/19 which showed unchanged mass  Interval renal ultrasound on 01/08/2019 shows slight interval growth of the lesion measuring 2.3 x 1.9 x 1.8 (previously 2 x 1.5 x 1.8 cm) This was not appreciated on previous scan on 11/2012.  RUS from 10/19/19 indicated stable appearing left renal mass consistent with prior exam.   She reports of back pain attributing to her disc surgery. No urinary complaints today. Denies gross hematuria or kidney pain.   PMH: Past Medical History:  Diagnosis Date  . Cancer [facility])    breast cancer right mastectomy  . Chronic kidney disease   . Diabetes mellitus without complication ([place])   . GIB (gastrointestinal bleeding) 02/20/2018  . Glaucoma   . Hyperlipidemia   . Hypertension   . Neuropathy   . Osteoarthritis   . Rheumatoid arthritis ([place])   . Telangiectasias     Surgical  History: Past Surgical History:  Procedure Laterality Date  . BREAST SURGERY     right mastectomy  . CHOLECYSTECTOMY    . COLONOSCOPY    . COLONOSCOPY WITH PROPOFOL N/A 08/14/2016   Procedure: COLONOSCOPY WITH PROPOFOL;  Surgeon: Lollie Sails, MD;  Location: [facility] ENDOSCOPY;  Service: Endoscopy;  Laterality: N/A;  . COLONOSCOPY WITH PROPOFOL N/A 02/22/2018   Procedure: COLONOSCOPY WITH PROPOFOL;  Surgeon: Jonathon Bellows, MD;  Location: [facility] ENDOSCOPY;  Service: Gastroenterology;  Laterality: N/A;  . ESOPHAGOGASTRODUODENOSCOPY (EGD) WITH PROPOFOL N/A 08/14/2016   Procedure: ESOPHAGOGASTRODUODENOSCOPY (EGD) WITH PROPOFOL;  Surgeon: Lollie Sails, MD;  Location: [facility] ENDOSCOPY;  Service: Endoscopy;  Laterality: N/A;  . ESOPHAGOGASTRODUODENOSCOPY (EGD) WITH PROPOFOL N/A 01/30/2018   Procedure: ESOPHAGOGASTRODUODENOSCOPY (EGD) WITH PROPOFOL;  Surgeon: Toledo, Benay Pike, MD;  Location: [facility] ENDOSCOPY;  Service: Gastroenterology;  Laterality: N/A;  . ESOPHAGOGASTRODUODENOSCOPY (EGD) WITH PROPOFOL N/A 02/22/2018   Procedure: ESOPHAGOGASTRODUODENOSCOPY (EGD) WITH PROPOFOL;  Surgeon: Jonathon Bellows, MD;  Location: [facility] ENDOSCOPY;  Service: Gastroenterology;  Laterality: N/A;  . EXCISIONAL HEMORRHOIDECTOMY    . KYPHOPLASTY N/A 03/20/2018   Procedure: [id number];  Surgeon: Hessie Knows, MD;  Location: [facility] ORS;  Service: Orthopedics;  Laterality: N/A;    Home Medications:  Allergies as of 10/23/2019   No Known Allergies     Medication List       Accurate as of October 23, 2019 11:59 PM. If you have any questions, ask your nurse or doctor.  Accu-Chek Aviva Plus test strip Generic drug: glucose blood   Accu-Chek Softclix Lancets lancets once daily Use as instructed.   Accu-Chek Softclix Lancets lancets ONCE DAILY USE AS INSTRUCTED.   alendronate 70 MG tablet Commonly known as: FOSAMAX TAKE 1 TABLET BY MOUTH EVERY 7DAYS WITH A FULL GLASS OF WATER. DO NOT LIE DOWN FOR 30 MINUTES    aspirin 81 MG EC tablet Take 81 mg by mouth daily.   BD AutoShield Duo 30G X 5 MM Misc Generic drug: Insulin Pen Needle   BD Pen Needle Nano U/F 32G X 4 MM Misc Generic drug: Insulin Pen Needle USE 1 NEEDLE SUBCUTANEOUSLY ONCE A DAY AS DIRECTED   BD Pen Needle Nano U/F 32G X 4 MM Misc Generic drug: Insulin Pen Needle   clindamycin 1 % external solution Commonly known as: CLEOCIN T Apply 1 application topically 2 (two) times daily.   clobetasol 0.05 % external solution Commonly known as: TEMOVATE Apply 1 application topically every evening. (apply to scalp)   colchicine 0.6 MG tablet Take 2 tabs PO x 1, then 1 tab PO 1 hour later x 1  Max: 1.8 mg total dose per attack, do not repeat within 3 days.   feeding supplement (ENSURE ENLIVE) Liqd Take 237 mLs by mouth 2 (two) times daily between meals.   ferrous fumarate 325 (106 Fe) MG Tabs tablet Commonly known as: HEMOCYTE - 106 mg FE Take 1 tablet (106 mg of iron total) by mouth 2 (two) times daily.   Fish Oil 1000 MG Caps Take 1 capsule by mouth.   glipiZIDE 10 MG tablet Commonly known as: GLUCOTROL Take 10 mg by mouth daily.   hydrOXYzine 10 MG tablet Commonly known as: ATARAX/VISTARIL Take 10 mg by mouth at bedtime.   ketoconazole 2 % shampoo Commonly known as: NIZORAL Apply 1 application topically daily. (apply to scalp and wait at least 30 minutes before rinsing)   ketoconazole 2 % cream Commonly known as: NIZORAL   Lantus SoloStar 100 UNIT/ML Solostar Pen Generic drug: insulin glargine Inject 36 Units into the skin daily. (may take up to 50u based upon blood glucose reading)   Lidocaine 4 % Ptch Place onto the skin.   loratadine 10 MG tablet Commonly known as: CLARITIN Take 10 mg by mouth daily.   losartan 50 MG tablet Commonly known as: COZAAR TAKE 1/2 TABLET (25MG) BY MOUTH EVERY DAY   Magnesium Oxide 400 (240 Mg) MG Tabs TAKE 2 TABLETS (800 MG TOTAL) BY MOUTH DAILY.   magnesium oxide 400 MG  tablet Commonly known as: MAG-OX Take 800 mg by mouth daily.   metFORMIN 500 MG 24 hr tablet Commonly known as: GLUCOPHAGE-XR   methocarbamol 500 MG tablet Commonly known as: ROBAXIN Take 500-1,500 mg by mouth See admin instructions. On day 1-3 take 1500 mg by mouth three times daily, on days 4-6 take 1000 mg by mouth three times daily, then take 1000 mg by mouth three times daily as needed.   niacin 500 MG CR tablet Commonly known as: NIASPAN Take 500 mg by mouth every evening.   pantoprazole 40 MG tablet Commonly known as: Protonix Take 1 tablet (40 mg total) by mouth daily.   potassium chloride SA 20 MEQ tablet Commonly known as: KLOR-CON Take 20 mEq by mouth 2 (two) times daily.   simvastatin 20 MG tablet Commonly known as: ZOCOR Take 20 mg by mouth at bedtime.   sitaGLIPtin 50 MG tablet Commonly known as: JANUVIA Take 50 mg by  mouth daily.   traMADol 50 MG tablet Commonly known as: ULTRAM Take by mouth.   traMADol 50 MG tablet Commonly known as: Ultram Take 1 tablet (50 mg total) by mouth 2 (two) times daily.   Travatan Z 0.004 % Soln ophthalmic solution Generic drug: Travoprost (BAK Free) Place 1 drop into the left eye at bedtime.   vitamin B-12 1000 MCG tablet Commonly known as: CYANOCOBALAMIN Take 1,000 mcg by mouth daily.   Vitamin D2 50 MCG (2000 UT) Tabs Take 1 tablet by mouth daily.       Allergies: No Known Allergies  Family History: Family History  Problem Relation Age of Onset  . Kidney cancer Son   . Bladder Cancer Neg Hx     Social History:  reports that she quit smoking about 8 years ago. She has never used smokeless tobacco. She reports that she does not drink alcohol or use drugs.   Physical Exam: BP (!) 147/68   Pulse (!) 109   Ht 5' 4"  (1.626 m)   Wt 189 lb (85.7 kg)   BMI 32.44 kg/m   Constitutional:  Alert and oriented, No acute distress. HEENT: [place] AT, moist mucus membranes.  Trachea midline, no masses. Cardiovascular:  No clubbing, cyanosis, or edema. Respiratory: Normal respiratory effort, no increased work of breathing. Skin: No rashes, bruises or suspicious lesions. Neurologic: Grossly intact, no focal deficits, moving all 4 extremities. Psychiatric: Normal mood and affect.  Pertinent Imaging: Results for orders placed during the hospital encounter of 10/19/19  US RENAL   Narrative CLINICAL DATA:  Follow-up renal mass  EXAM: RENAL / URINARY TRACT ULTRASOUND COMPLETE  COMPARISON:  01/08/2019  FINDINGS: Right Kidney:  Renal measurements: 10.3 x 3.1 x 4.3 cm. = volume: 73 mL . Echogenicity within normal limits. No mass or hydronephrosis visualized.  Left Kidney:  Renal measurements: 10.3 x 3.9 x 4.6 cm. = volume: 98 mL. 1.9 x 2.1 x 2.2 cm mildly echogenic solid mass is again identified. Given some slight technical variations in the film the overall appearance is stable. No significant growth is seen. No hydronephrosis or other mass lesion is noted.  Bladder:  Appears normal for degree of bladder distention.  Other:  None.  IMPRESSION: Stable appearing left renal mass when compared with the prior exam. No new focal abnormality is seen.   Electronically Signed   By: Inez Catalina M.D.   On: 10/19/2019 20:20    I have personally reviewed the images and agree with radiologist interpretation.  Stable.    Assessment & Plan:    1. Renal mass Incidental left upper pole renal mass  No interval change in lesion for 9 months as indicated on RUS which is reassuring- likely either indolent malignancy or benign  Given her age and comorbities will continue active surveillance on an yearly basis with RUS   We discussed if the lesion continues to progress or accelerating growth rate, she should strongly consider intervention while the lesion is still small.  She is most interested in cryotherapy if any intervention deemed necessary in future.  We discussed the risk of metastatic disease,  less than 5% for lesions less than 3 to 4 cm in size.  She understands all this and is agreeable to plan. Return in 1 year with RUS   Return in about 1 year (around 10/22/2020) for RUS.  [facility] [address], [place], [place] [address] [phone]  I, Lucas Mallow, am acting as a Education administrator  for Dr. Hollice Espy,  I have reviewed the above documentation for accuracy and completeness, and I agree with the above.   Hollice Espy, MD

## 2019-10-23 ENCOUNTER — Encounter: Payer: Self-pay | Admitting: Urology

## 2019-10-23 ENCOUNTER — Ambulatory Visit: Payer: Medicare HMO | Admitting: Urology

## 2019-10-23 ENCOUNTER — Other Ambulatory Visit: Payer: Self-pay

## 2019-10-23 VITALS — BP 147/68 | HR 109 | Ht 64.0 in | Wt 189.0 lb

## 2019-10-23 DIAGNOSIS — N2889 Other specified disorders of kidney and ureter: Secondary | ICD-10-CM | POA: Diagnosis not present

## 2019-11-03 DIAGNOSIS — H401123 Primary open-angle glaucoma, left eye, severe stage: Secondary | ICD-10-CM | POA: Diagnosis not present

## 2019-11-03 DIAGNOSIS — H401111 Primary open-angle glaucoma, right eye, mild stage: Secondary | ICD-10-CM | POA: Diagnosis not present

## 2019-11-23 DIAGNOSIS — M810 Age-related osteoporosis without current pathological fracture: Secondary | ICD-10-CM | POA: Diagnosis not present

## 2019-11-23 DIAGNOSIS — E1142 Type 2 diabetes mellitus with diabetic polyneuropathy: Secondary | ICD-10-CM | POA: Diagnosis not present

## 2019-11-23 DIAGNOSIS — E785 Hyperlipidemia, unspecified: Secondary | ICD-10-CM | POA: Diagnosis not present

## 2019-11-23 DIAGNOSIS — Z794 Long term (current) use of insulin: Secondary | ICD-10-CM | POA: Diagnosis not present

## 2019-11-23 DIAGNOSIS — E1169 Type 2 diabetes mellitus with other specified complication: Secondary | ICD-10-CM | POA: Diagnosis not present

## 2020-02-17 ENCOUNTER — Other Ambulatory Visit: Payer: Self-pay | Admitting: Family Medicine

## 2020-02-17 DIAGNOSIS — Z0000 Encounter for general adult medical examination without abnormal findings: Secondary | ICD-10-CM | POA: Diagnosis not present

## 2020-02-17 DIAGNOSIS — E782 Mixed hyperlipidemia: Secondary | ICD-10-CM | POA: Diagnosis not present

## 2020-02-17 DIAGNOSIS — E1140 Type 2 diabetes mellitus with diabetic neuropathy, unspecified: Secondary | ICD-10-CM | POA: Diagnosis not present

## 2020-02-17 DIAGNOSIS — K7460 Unspecified cirrhosis of liver: Secondary | ICD-10-CM | POA: Diagnosis not present

## 2020-02-17 DIAGNOSIS — N1832 Chronic kidney disease, stage 3b: Secondary | ICD-10-CM | POA: Diagnosis not present

## 2020-02-17 DIAGNOSIS — R221 Localized swelling, mass and lump, neck: Secondary | ICD-10-CM

## 2020-02-17 DIAGNOSIS — E1122 Type 2 diabetes mellitus with diabetic chronic kidney disease: Secondary | ICD-10-CM | POA: Diagnosis not present

## 2020-02-17 DIAGNOSIS — Z794 Long term (current) use of insulin: Secondary | ICD-10-CM

## 2020-02-17 DIAGNOSIS — D508 Other iron deficiency anemias: Secondary | ICD-10-CM | POA: Diagnosis not present

## 2020-02-17 DIAGNOSIS — N2889 Other specified disorders of kidney and ureter: Secondary | ICD-10-CM | POA: Diagnosis not present

## 2020-02-17 DIAGNOSIS — I129 Hypertensive chronic kidney disease with stage 1 through stage 4 chronic kidney disease, or unspecified chronic kidney disease: Secondary | ICD-10-CM | POA: Diagnosis not present

## 2020-02-25 ENCOUNTER — Ambulatory Visit
Admission: RE | Admit: 2020-02-25 | Discharge: 2020-02-25 | Disposition: A | Discharge status: Home / Self Care | Payer: Medicare HMO | Source: Ambulatory Visit | Attending: Family Medicine | Admitting: Family Medicine

## 2020-02-25 ENCOUNTER — Other Ambulatory Visit: Payer: Self-pay

## 2020-02-25 DIAGNOSIS — Z794 Long term (current) use of insulin: Secondary | ICD-10-CM | POA: Insufficient documentation

## 2020-02-25 DIAGNOSIS — E1140 Type 2 diabetes mellitus with diabetic neuropathy, unspecified: Secondary | ICD-10-CM | POA: Diagnosis not present

## 2020-02-25 DIAGNOSIS — R221 Localized swelling, mass and lump, neck: Secondary | ICD-10-CM | POA: Diagnosis not present

## 2020-02-25 DIAGNOSIS — K115 Sialolithiasis: Secondary | ICD-10-CM | POA: Diagnosis not present

## 2020-02-25 IMAGING — US US RENAL
1 series · 14 of 25 positions shown · non-contrast
Comparison: Ultrasound 07/30/2017, CT 01/30/2017, 09/27/2016

ADDENDUM:
Incidentally noted is marked splenomegaly with splenic volume of
682.1 cubic cm. Previous volume on 07/30/2017 was 619 cc.
CLINICAL DATA: Follow-up renal mass

EXAM:
RENAL / URINARY TRACT ULTRASOUND COMPLETE

[Series 1: us renal · 0.25mm/px · 14 of 52 slices shown]
[im 1/52]
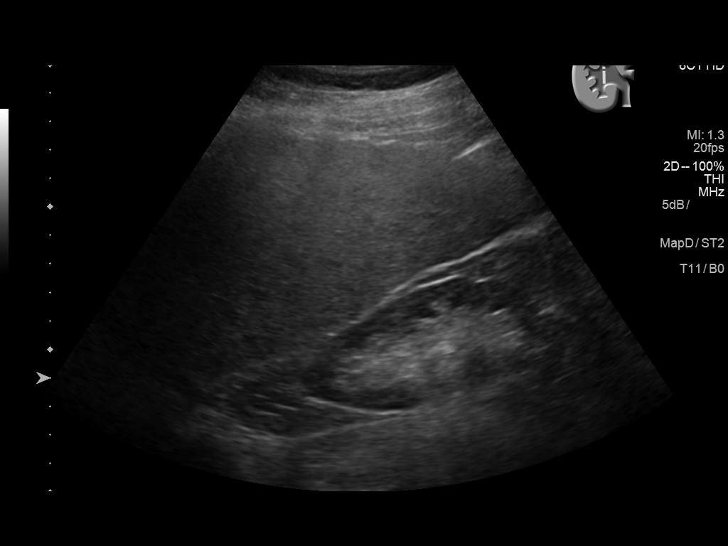
[im 5/52]
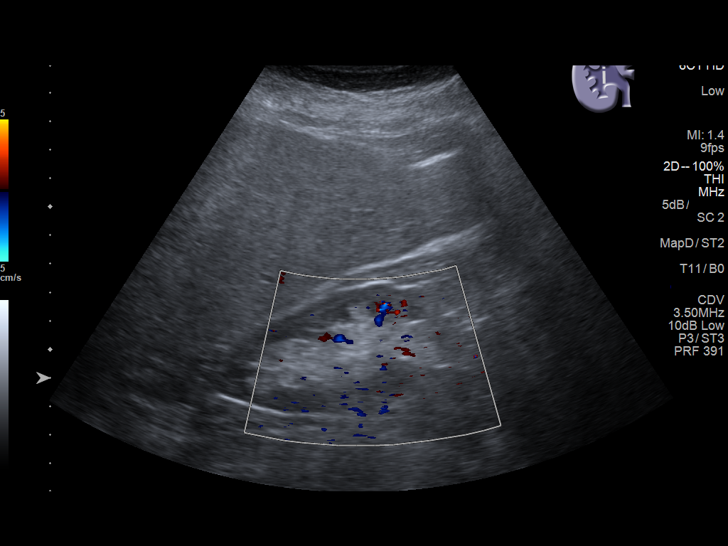
[im 9/52]
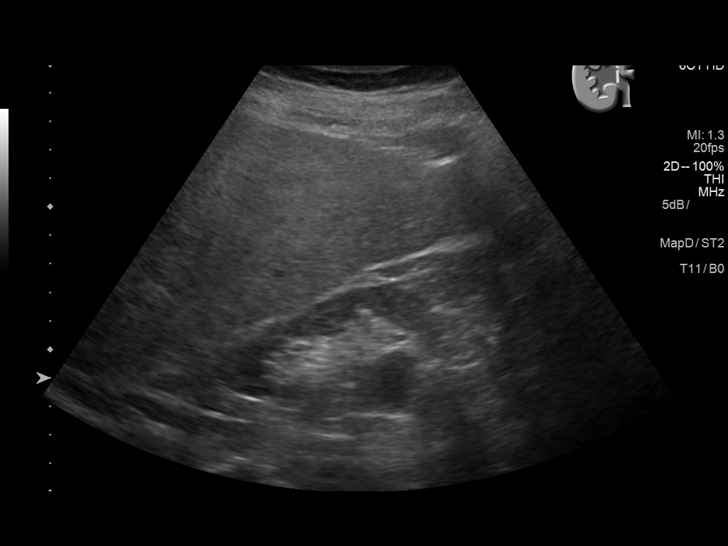
[im 13/52]
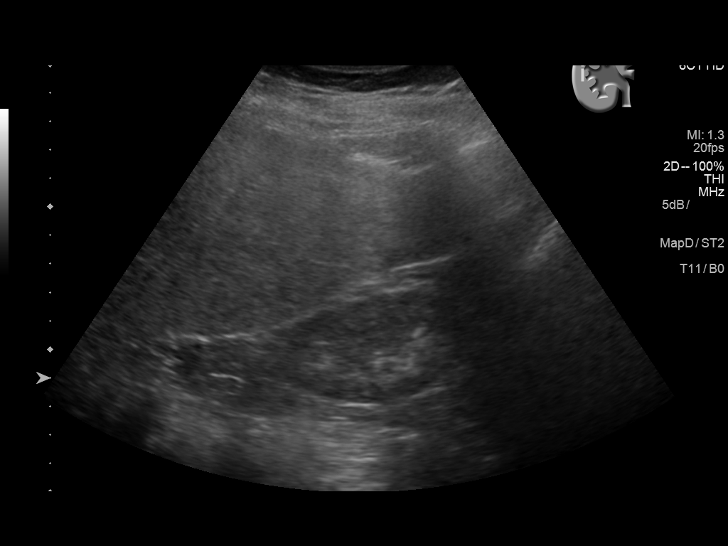
[im 18/52]
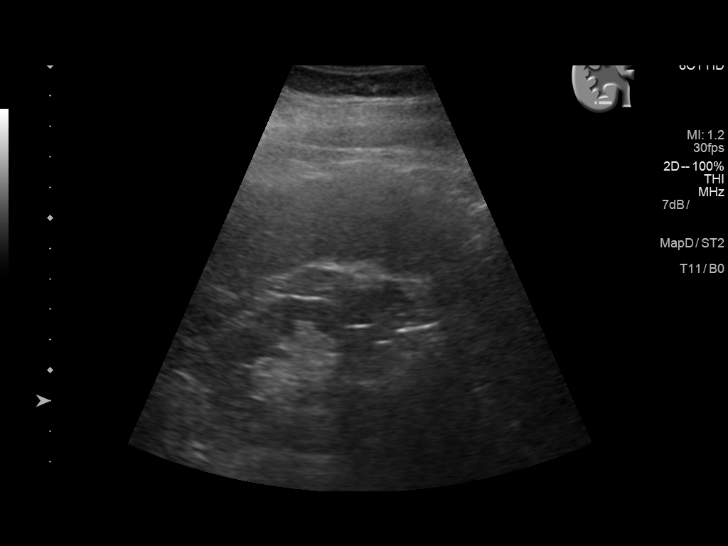
[im 20/52]
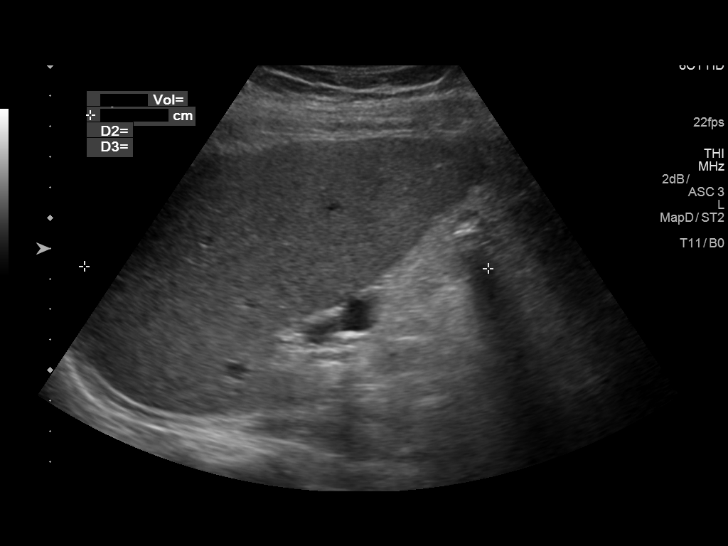
[im 24/52]
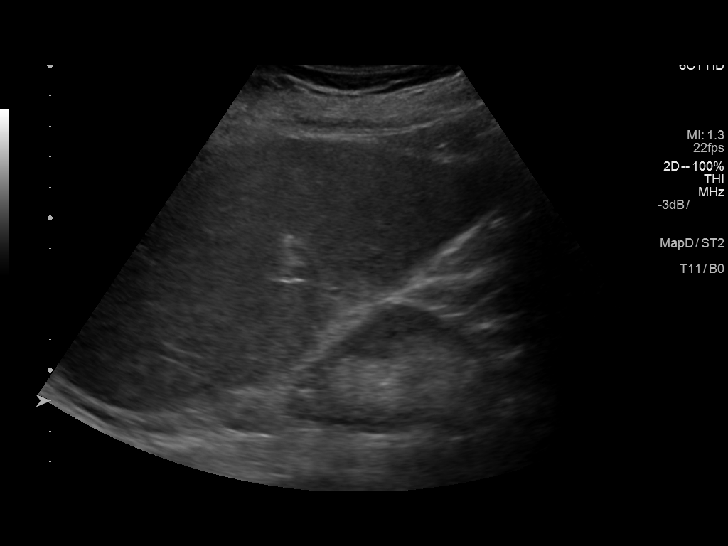
[im 28/52]
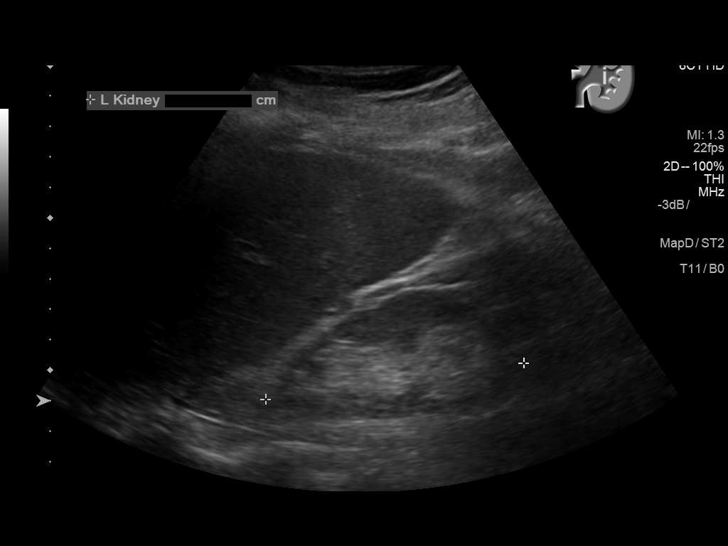
[im 32/52]
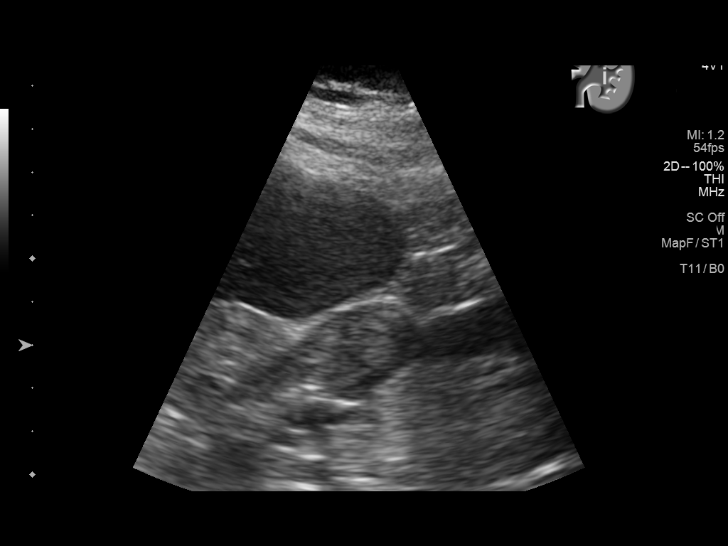
[im 35/52]
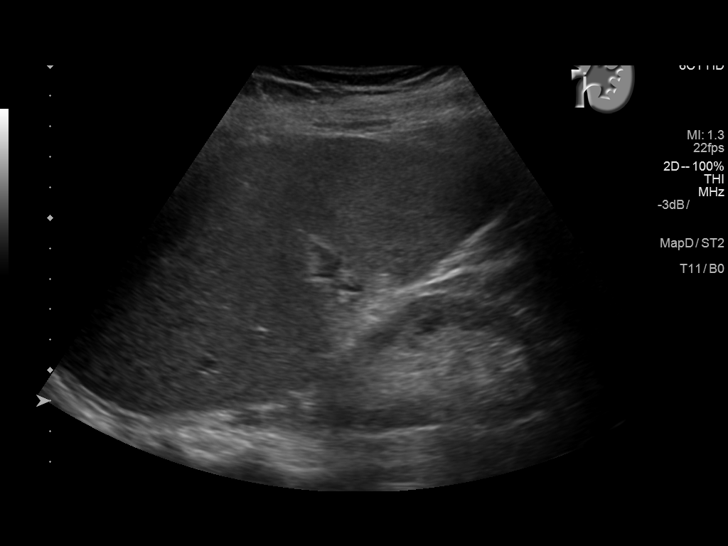
[im 39/52]
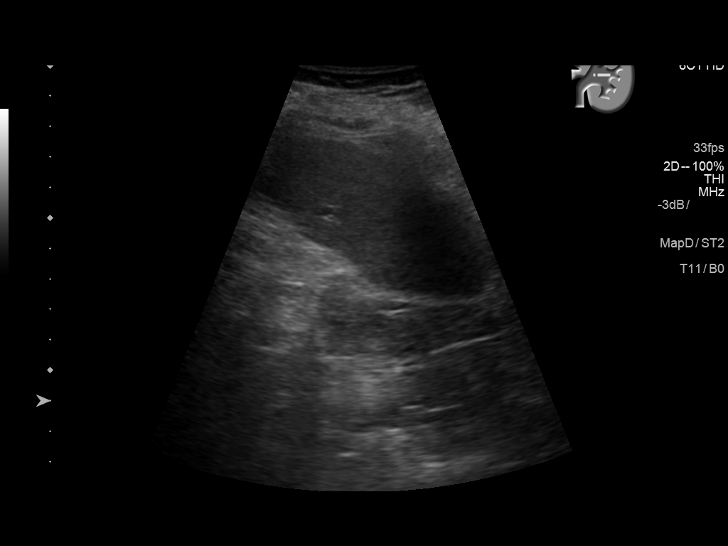
[im 43/52]
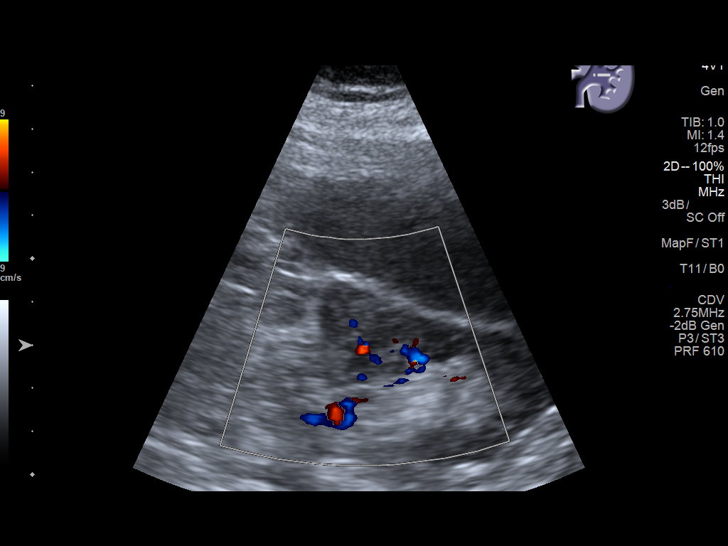
[im 47/52]
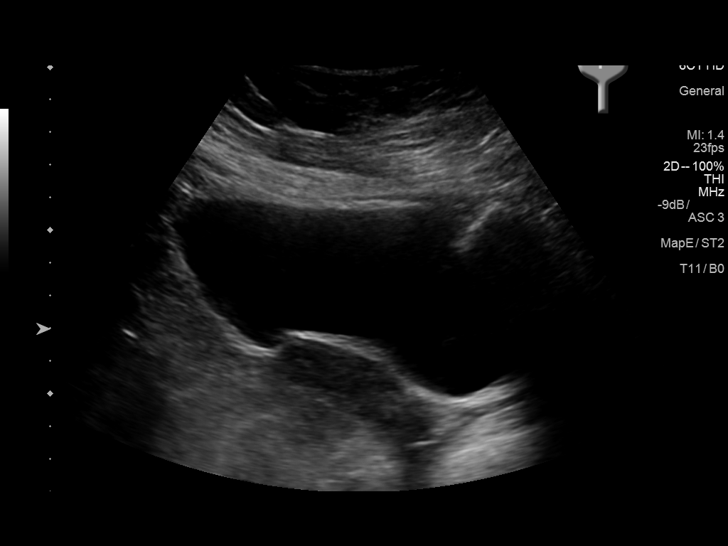
[im 52/52]
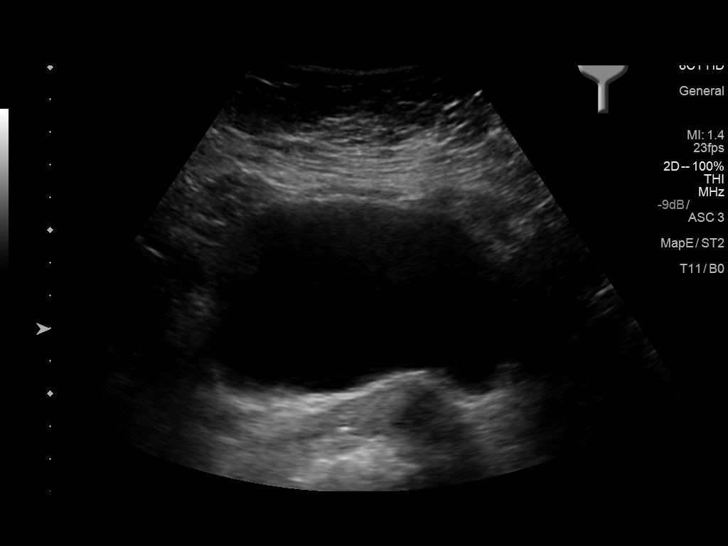

[14 of 25 positions shown; findings below may reference images not displayed]

FINDINGS: Right Kidney:

Length: 9.9 cm. Cortical thinning is present. No hydronephrosis or
focal abnormality.

Left Kidney:

Length: 8.6 cm. Cortical thinning is present. No hydronephrosis.
Hypoechoic mass in the mid to upper pole of the left kidney
measuring 2 x 1.5 x 1.8 cm. Previous ultrasound measurements of
x 1.2 x 1.4 cm. Lesion now appears solid.

Bladder:

Appears normal for degree of bladder distention.
IMPRESSION: 1. Increased size of the mid to upper pole mass lesion in the left
kidney which now appears solid as opposed to complex cystic.
Findings are concerning for renal carcinoma. Follow-up renal
protocol CT or MRI is advised.

These results will be called to the ordering clinician or
representative by the Radiologist Assistant, and communication
documented in the PACS or zVision Dashboard.

## 2020-03-25 DIAGNOSIS — D649 Anemia, unspecified: Secondary | ICD-10-CM | POA: Diagnosis not present

## 2020-04-06 DIAGNOSIS — E119 Type 2 diabetes mellitus without complications: Secondary | ICD-10-CM | POA: Diagnosis not present

## 2020-04-06 DIAGNOSIS — H401112 Primary open-angle glaucoma, right eye, moderate stage: Secondary | ICD-10-CM | POA: Diagnosis not present

## 2020-04-27 DIAGNOSIS — D649 Anemia, unspecified: Secondary | ICD-10-CM | POA: Diagnosis not present

## 2020-05-23 DIAGNOSIS — M810 Age-related osteoporosis without current pathological fracture: Secondary | ICD-10-CM | POA: Diagnosis not present

## 2020-05-30 DIAGNOSIS — E1169 Type 2 diabetes mellitus with other specified complication: Secondary | ICD-10-CM | POA: Diagnosis not present

## 2020-05-30 DIAGNOSIS — E785 Hyperlipidemia, unspecified: Secondary | ICD-10-CM | POA: Diagnosis not present

## 2020-05-30 DIAGNOSIS — Z794 Long term (current) use of insulin: Secondary | ICD-10-CM | POA: Diagnosis not present

## 2020-05-30 DIAGNOSIS — M810 Age-related osteoporosis without current pathological fracture: Secondary | ICD-10-CM | POA: Diagnosis not present

## 2020-05-30 DIAGNOSIS — E1142 Type 2 diabetes mellitus with diabetic polyneuropathy: Secondary | ICD-10-CM | POA: Diagnosis not present

## 2020-08-24 DIAGNOSIS — K7460 Unspecified cirrhosis of liver: Secondary | ICD-10-CM | POA: Diagnosis not present

## 2020-08-24 DIAGNOSIS — I272 Pulmonary hypertension, unspecified: Secondary | ICD-10-CM | POA: Diagnosis not present

## 2020-08-24 DIAGNOSIS — E782 Mixed hyperlipidemia: Secondary | ICD-10-CM | POA: Diagnosis not present

## 2020-08-24 DIAGNOSIS — Z794 Long term (current) use of insulin: Secondary | ICD-10-CM | POA: Diagnosis not present

## 2020-08-24 DIAGNOSIS — D508 Other iron deficiency anemias: Secondary | ICD-10-CM | POA: Diagnosis not present

## 2020-08-24 DIAGNOSIS — I129 Hypertensive chronic kidney disease with stage 1 through stage 4 chronic kidney disease, or unspecified chronic kidney disease: Secondary | ICD-10-CM | POA: Diagnosis not present

## 2020-08-24 DIAGNOSIS — E1140 Type 2 diabetes mellitus with diabetic neuropathy, unspecified: Secondary | ICD-10-CM | POA: Diagnosis not present

## 2020-08-24 DIAGNOSIS — I700 Atherosclerosis of aorta: Secondary | ICD-10-CM | POA: Diagnosis not present

## 2020-08-24 DIAGNOSIS — E1122 Type 2 diabetes mellitus with diabetic chronic kidney disease: Secondary | ICD-10-CM | POA: Diagnosis not present

## 2020-08-24 DIAGNOSIS — N1832 Chronic kidney disease, stage 3b: Secondary | ICD-10-CM | POA: Diagnosis not present

## 2020-10-11 DIAGNOSIS — I272 Pulmonary hypertension, unspecified: Secondary | ICD-10-CM | POA: Diagnosis not present

## 2020-10-14 ENCOUNTER — Ambulatory Visit
Admission: RE | Admit: 2020-10-14 | Discharge: 2020-10-14 | Disposition: A | Discharge status: Home / Self Care | Payer: Medicare HMO | Source: Ambulatory Visit | Attending: Urology | Admitting: Urology

## 2020-10-14 ENCOUNTER — Other Ambulatory Visit: Payer: Self-pay

## 2020-10-14 DIAGNOSIS — N2889 Other specified disorders of kidney and ureter: Secondary | ICD-10-CM

## 2020-10-17 ENCOUNTER — Telehealth: Payer: Self-pay | Admitting: *Deleted

## 2020-10-17 NOTE — Telephone Encounter (Addendum)
Left VM to return call to schedule appointment.   ----- Message from Hollice Espy, MD sent at 10/17/2020 12:52 PM EDT ----- Please schedule an office visit to discuss these findings.  Hollice Espy, MD

## 2020-10-24 ENCOUNTER — Emergency Department: Payer: Medicare HMO

## 2020-10-24 ENCOUNTER — Other Ambulatory Visit: Payer: Self-pay

## 2020-10-24 ENCOUNTER — Encounter: Payer: Self-pay | Admitting: Intensive Care

## 2020-10-24 ENCOUNTER — Inpatient Hospital Stay
Admission: EM | Admit: 2020-10-24 | Discharge: 2020-10-29 | DRG: 242 | Disposition: A | Discharge status: Home Health | Payer: Medicare HMO | Attending: Internal Medicine | Admitting: Internal Medicine

## 2020-10-24 DIAGNOSIS — R0902 Hypoxemia: Secondary | ICD-10-CM | POA: Diagnosis present

## 2020-10-24 DIAGNOSIS — Z79899 Other long term (current) drug therapy: Secondary | ICD-10-CM

## 2020-10-24 DIAGNOSIS — Z87891 Personal history of nicotine dependence: Secondary | ICD-10-CM

## 2020-10-24 DIAGNOSIS — E8770 Fluid overload, unspecified: Secondary | ICD-10-CM | POA: Diagnosis present

## 2020-10-24 DIAGNOSIS — R0602 Shortness of breath: Secondary | ICD-10-CM | POA: Diagnosis not present

## 2020-10-24 DIAGNOSIS — I129 Hypertensive chronic kidney disease with stage 1 through stage 4 chronic kidney disease, or unspecified chronic kidney disease: Secondary | ICD-10-CM | POA: Diagnosis present

## 2020-10-24 DIAGNOSIS — S82831A Other fracture of upper and lower end of right fibula, initial encounter for closed fracture: Secondary | ICD-10-CM | POA: Diagnosis present

## 2020-10-24 DIAGNOSIS — E785 Hyperlipidemia, unspecified: Secondary | ICD-10-CM | POA: Diagnosis present

## 2020-10-24 DIAGNOSIS — N170 Acute kidney failure with tubular necrosis: Secondary | ICD-10-CM | POA: Diagnosis present

## 2020-10-24 DIAGNOSIS — E860 Dehydration: Secondary | ICD-10-CM | POA: Diagnosis present

## 2020-10-24 DIAGNOSIS — I509 Heart failure, unspecified: Secondary | ICD-10-CM | POA: Diagnosis not present

## 2020-10-24 DIAGNOSIS — K7460 Unspecified cirrhosis of liver: Secondary | ICD-10-CM | POA: Diagnosis present

## 2020-10-24 DIAGNOSIS — M109 Gout, unspecified: Secondary | ICD-10-CM | POA: Diagnosis present

## 2020-10-24 DIAGNOSIS — N2581 Secondary hyperparathyroidism of renal origin: Secondary | ICD-10-CM | POA: Diagnosis present

## 2020-10-24 DIAGNOSIS — I4589 Other specified conduction disorders: Secondary | ICD-10-CM | POA: Diagnosis present

## 2020-10-24 DIAGNOSIS — I272 Pulmonary hypertension, unspecified: Secondary | ICD-10-CM | POA: Diagnosis present

## 2020-10-24 DIAGNOSIS — E872 Acidosis: Secondary | ICD-10-CM | POA: Diagnosis present

## 2020-10-24 DIAGNOSIS — N2889 Other specified disorders of kidney and ureter: Secondary | ICD-10-CM | POA: Diagnosis present

## 2020-10-24 DIAGNOSIS — Z7982 Long term (current) use of aspirin: Secondary | ICD-10-CM

## 2020-10-24 DIAGNOSIS — I081 Rheumatic disorders of both mitral and tricuspid valves: Secondary | ICD-10-CM | POA: Diagnosis present

## 2020-10-24 DIAGNOSIS — I5030 Unspecified diastolic (congestive) heart failure: Secondary | ICD-10-CM

## 2020-10-24 DIAGNOSIS — H409 Unspecified glaucoma: Secondary | ICD-10-CM | POA: Diagnosis present

## 2020-10-24 DIAGNOSIS — R809 Proteinuria, unspecified: Secondary | ICD-10-CM | POA: Diagnosis not present

## 2020-10-24 DIAGNOSIS — R55 Syncope and collapse: Secondary | ICD-10-CM | POA: Diagnosis present

## 2020-10-24 DIAGNOSIS — E875 Hyperkalemia: Secondary | ICD-10-CM | POA: Diagnosis present

## 2020-10-24 DIAGNOSIS — B1910 Unspecified viral hepatitis B without hepatic coma: Secondary | ICD-10-CM | POA: Diagnosis present

## 2020-10-24 DIAGNOSIS — I4891 Unspecified atrial fibrillation: Secondary | ICD-10-CM | POA: Diagnosis not present

## 2020-10-24 DIAGNOSIS — Y9301 Activity, walking, marching and hiking: Secondary | ICD-10-CM | POA: Diagnosis present

## 2020-10-24 DIAGNOSIS — I5031 Acute diastolic (congestive) heart failure: Secondary | ICD-10-CM | POA: Diagnosis not present

## 2020-10-24 DIAGNOSIS — Z79891 Long term (current) use of opiate analgesic: Secondary | ICD-10-CM

## 2020-10-24 DIAGNOSIS — N179 Acute kidney failure, unspecified: Secondary | ICD-10-CM | POA: Diagnosis present

## 2020-10-24 DIAGNOSIS — D631 Anemia in chronic kidney disease: Secondary | ICD-10-CM | POA: Diagnosis present

## 2020-10-24 DIAGNOSIS — I4430 Unspecified atrioventricular block: Secondary | ICD-10-CM | POA: Diagnosis not present

## 2020-10-24 DIAGNOSIS — K219 Gastro-esophageal reflux disease without esophagitis: Secondary | ICD-10-CM | POA: Diagnosis present

## 2020-10-24 DIAGNOSIS — M069 Rheumatoid arthritis, unspecified: Secondary | ICD-10-CM | POA: Diagnosis present

## 2020-10-24 DIAGNOSIS — R7989 Other specified abnormal findings of blood chemistry: Secondary | ICD-10-CM | POA: Diagnosis present

## 2020-10-24 DIAGNOSIS — Y92009 Unspecified place in unspecified non-institutional (private) residence as the place of occurrence of the external cause: Secondary | ICD-10-CM

## 2020-10-24 DIAGNOSIS — R001 Bradycardia, unspecified: Secondary | ICD-10-CM | POA: Diagnosis present

## 2020-10-24 DIAGNOSIS — Z20822 Contact with and (suspected) exposure to covid-19: Secondary | ICD-10-CM | POA: Diagnosis present

## 2020-10-24 DIAGNOSIS — D696 Thrombocytopenia, unspecified: Secondary | ICD-10-CM | POA: Diagnosis present

## 2020-10-24 DIAGNOSIS — Z794 Long term (current) use of insulin: Secondary | ICD-10-CM

## 2020-10-24 DIAGNOSIS — E1122 Type 2 diabetes mellitus with diabetic chronic kidney disease: Secondary | ICD-10-CM | POA: Diagnosis present

## 2020-10-24 DIAGNOSIS — N1832 Chronic kidney disease, stage 3b: Secondary | ICD-10-CM | POA: Diagnosis present

## 2020-10-24 DIAGNOSIS — Z853 Personal history of malignant neoplasm of breast: Secondary | ICD-10-CM

## 2020-10-24 DIAGNOSIS — Z9861 Coronary angioplasty status: Secondary | ICD-10-CM | POA: Diagnosis not present

## 2020-10-24 DIAGNOSIS — W1839XA Other fall on same level, initial encounter: Secondary | ICD-10-CM | POA: Diagnosis present

## 2020-10-24 DIAGNOSIS — I442 Atrioventricular block, complete: Secondary | ICD-10-CM | POA: Diagnosis present

## 2020-10-24 DIAGNOSIS — R1310 Dysphagia, unspecified: Secondary | ICD-10-CM | POA: Diagnosis present

## 2020-10-24 DIAGNOSIS — E1142 Type 2 diabetes mellitus with diabetic polyneuropathy: Secondary | ICD-10-CM | POA: Diagnosis present

## 2020-10-24 DIAGNOSIS — I959 Hypotension, unspecified: Secondary | ICD-10-CM | POA: Diagnosis not present

## 2020-10-24 DIAGNOSIS — A0472 Enterocolitis due to Clostridium difficile, not specified as recurrent: Secondary | ICD-10-CM | POA: Diagnosis present

## 2020-10-24 DIAGNOSIS — I517 Cardiomegaly: Secondary | ICD-10-CM | POA: Diagnosis not present

## 2020-10-24 DIAGNOSIS — Z9011 Acquired absence of right breast and nipple: Secondary | ICD-10-CM

## 2020-10-24 DIAGNOSIS — M25571 Pain in right ankle and joints of right foot: Secondary | ICD-10-CM

## 2020-10-24 DIAGNOSIS — Z7984 Long term (current) use of oral hypoglycemic drugs: Secondary | ICD-10-CM

## 2020-10-24 DIAGNOSIS — M810 Age-related osteoporosis without current pathological fracture: Secondary | ICD-10-CM | POA: Diagnosis present

## 2020-10-24 DIAGNOSIS — R4182 Altered mental status, unspecified: Secondary | ICD-10-CM | POA: Diagnosis not present

## 2020-10-24 DIAGNOSIS — M899 Disorder of bone, unspecified: Secondary | ICD-10-CM | POA: Diagnosis present

## 2020-10-24 DIAGNOSIS — K7469 Other cirrhosis of liver: Secondary | ICD-10-CM | POA: Diagnosis not present

## 2020-10-24 DIAGNOSIS — E538 Deficiency of other specified B group vitamins: Secondary | ICD-10-CM | POA: Diagnosis present

## 2020-10-24 DIAGNOSIS — I10 Essential (primary) hypertension: Secondary | ICD-10-CM | POA: Diagnosis not present

## 2020-10-24 DIAGNOSIS — I700 Atherosclerosis of aorta: Secondary | ICD-10-CM | POA: Diagnosis not present

## 2020-10-24 DIAGNOSIS — S9001XA Contusion of right ankle, initial encounter: Secondary | ICD-10-CM | POA: Diagnosis not present

## 2020-10-24 DIAGNOSIS — R161 Splenomegaly, not elsewhere classified: Secondary | ICD-10-CM | POA: Diagnosis not present

## 2020-10-24 HISTORY — DX: Dysphagia, unspecified: R13.10

## 2020-10-24 HISTORY — DX: Hyperkalemia: E87.5

## 2020-10-24 HISTORY — DX: Syncope and collapse: R55

## 2020-10-24 LAB — CBC WITH DIFFERENTIAL/PLATELET
Abs Immature Granulocytes: 0.04 10*3/uL (ref 0.00–0.07)
Basophils Absolute: 0.1 10*3/uL (ref 0.0–0.1)
Basophils Relative: 1 %
Eosinophils Absolute: 0.3 10*3/uL (ref 0.0–0.5)
Eosinophils Relative: 3 %
HCT: 29.3 % — ABNORMAL LOW (ref 36.0–46.0)
Hemoglobin: 8.8 g/dL — ABNORMAL LOW (ref 12.0–15.0)
Immature Granulocytes: 0 %
Lymphocytes Relative: 10 %
Lymphs Abs: 0.9 10*3/uL (ref 0.7–4.0)
MCH: 31.7 pg (ref 26.0–34.0)
MCHC: 30 g/dL (ref 30.0–36.0)
MCV: 105.4 fL — ABNORMAL HIGH (ref 80.0–100.0)
Monocytes Absolute: 0.6 10*3/uL (ref 0.1–1.0)
Monocytes Relative: 6 %
Neutro Abs: 7.3 10*3/uL (ref 1.7–7.7)
Neutrophils Relative %: 80 %
Platelets: 205 10*3/uL (ref 150–400)
RBC: 2.78 MIL/uL — ABNORMAL LOW (ref 3.87–5.11)
RDW: 17 % — ABNORMAL HIGH (ref 11.5–15.5)
WBC: 9.3 10*3/uL (ref 4.0–10.5)
nRBC: 0 % (ref 0.0–0.2)

## 2020-10-24 LAB — C DIFFICILE QUICK SCREEN W PCR REFLEX
C Diff antigen: POSITIVE — AB
C Diff toxin: NEGATIVE

## 2020-10-24 LAB — RESP PANEL BY RT-PCR (FLU A&B, COVID) ARPGX2
Influenza A by PCR: NEGATIVE
Influenza B by PCR: NEGATIVE
SARS Coronavirus 2 by RT PCR: NEGATIVE

## 2020-10-24 LAB — COMPREHENSIVE METABOLIC PANEL
ALT: 13 U/L (ref 0–44)
AST: 30 U/L (ref 15–41)
Albumin: 3.6 g/dL (ref 3.5–5.0)
Alkaline Phosphatase: 55 U/L (ref 38–126)
Anion gap: 14 (ref 5–15)
BUN: 52 mg/dL — ABNORMAL HIGH (ref 8–23)
CO2: 15 mmol/L — ABNORMAL LOW (ref 22–32)
Calcium: 9.1 mg/dL (ref 8.9–10.3)
Chloride: 107 mmol/L (ref 98–111)
Creatinine, Ser: 3.79 mg/dL — ABNORMAL HIGH (ref 0.44–1.00)
GFR, Estimated: 12 mL/min — ABNORMAL LOW (ref >60)
Glucose, Bld: 169 mg/dL — ABNORMAL HIGH (ref 70–99)
Potassium: 5.2 mmol/L — ABNORMAL HIGH (ref 3.5–5.1)
Sodium: 136 mmol/L (ref 135–145)
Total Bilirubin: 1 mg/dL (ref 0.3–1.2)
Total Protein: 6.9 g/dL (ref 6.5–8.1)

## 2020-10-24 LAB — TROPONIN I (HIGH SENSITIVITY)
Troponin I (High Sensitivity): 23 ng/L — ABNORMAL HIGH (ref <18)
Troponin I (High Sensitivity): 26 ng/L — ABNORMAL HIGH (ref <18)

## 2020-10-24 LAB — CBG MONITORING, ED: Glucose-Capillary: 129 mg/dL — ABNORMAL HIGH (ref 70–99)

## 2020-10-24 LAB — MAGNESIUM: Magnesium: 2.8 mg/dL — ABNORMAL HIGH (ref 1.7–2.4)

## 2020-10-24 LAB — CLOSTRIDIUM DIFFICILE BY PCR, REFLEXED: Toxigenic C. Difficile by PCR: POSITIVE — AB

## 2020-10-24 MED ORDER — INSULIN ASPART 100 UNIT/ML ~~LOC~~ SOLN
0.0000 [IU] | Freq: Three times a day (TID) | SUBCUTANEOUS | Status: DC
  Administered 2020-10-25: 3 [IU] via SUBCUTANEOUS
  Administered 2020-10-25 – 2020-10-26 (×4): 2 [IU] via SUBCUTANEOUS
  Administered 2020-10-27: 3 [IU] via SUBCUTANEOUS
  Administered 2020-10-27: 2 [IU] via SUBCUTANEOUS
  Administered 2020-10-28: 5 [IU] via SUBCUTANEOUS
  Administered 2020-10-28: 3 [IU] via SUBCUTANEOUS
  Administered 2020-10-28: 5 [IU] via SUBCUTANEOUS
  Administered 2020-10-29: 3 [IU] via SUBCUTANEOUS
  Administered 2020-10-29: 5 [IU] via SUBCUTANEOUS
  Filled 2020-10-24 (×12): qty 1

## 2020-10-24 MED ORDER — VITAMIN D2 50 MCG (2000 UT) PO TABS: 1.0000 | ORAL_TABLET | Freq: Every day | ORAL | Status: DC

## 2020-10-24 MED ORDER — ONDANSETRON HCL 4 MG/2ML IJ SOLN: 4.0000 mg | Freq: Four times a day (QID) | INTRAMUSCULAR | Status: DC | PRN

## 2020-10-24 MED ORDER — SIMVASTATIN 20 MG PO TABS
20.0000 mg | ORAL_TABLET | Freq: Every day | ORAL | Status: DC
  Administered 2020-10-24: 20 mg via ORAL
  Filled 2020-10-24: qty 2

## 2020-10-24 MED ORDER — LORATADINE 10 MG PO TABS
10.0000 mg | ORAL_TABLET | Freq: Every day | ORAL | Status: DC
  Administered 2020-10-27 – 2020-10-29 (×3): 10 mg via ORAL
  Filled 2020-10-24 (×5): qty 1

## 2020-10-24 MED ORDER — LATANOPROST 0.005 % OP SOLN
1.0000 [drp] | Freq: Every day | OPHTHALMIC | Status: DC
  Administered 2020-10-27 – 2020-10-28 (×2): 1 [drp] via OPHTHALMIC
  Filled 2020-10-24 (×2): qty 2.5

## 2020-10-24 MED ORDER — STERILE WATER FOR INJECTION IV SOLN
INTRAVENOUS | Status: DC
  Filled 2020-10-24: qty 150
  Filled 2020-10-24 (×3): qty 1000

## 2020-10-24 MED ORDER — FERROUS FUMARATE 324 (106 FE) MG PO TABS
1.0000 | ORAL_TABLET | Freq: Two times a day (BID) | ORAL | Status: DC
  Administered 2020-10-24 – 2020-10-29 (×8): 106 mg via ORAL
  Filled 2020-10-24 (×11): qty 1

## 2020-10-24 MED ORDER — LACTATED RINGERS IV SOLN: INTRAVENOUS | Status: DC

## 2020-10-24 MED ORDER — VITAMIN B-12 1000 MCG PO TABS
1000.0000 ug | ORAL_TABLET | Freq: Every day | ORAL | Status: DC
  Administered 2020-10-27 – 2020-10-29 (×3): 1000 ug via ORAL
  Filled 2020-10-24 (×6): qty 1

## 2020-10-24 MED ORDER — PANTOPRAZOLE SODIUM 40 MG PO TBEC
40.0000 mg | DELAYED_RELEASE_TABLET | Freq: Every day | ORAL | Status: DC
  Administered 2020-10-28 – 2020-10-29 (×2): 40 mg via ORAL
  Filled 2020-10-24 (×5): qty 1

## 2020-10-24 MED ORDER — ACETAMINOPHEN 325 MG PO TABS
650.0000 mg | ORAL_TABLET | Freq: Four times a day (QID) | ORAL | Status: DC | PRN
  Administered 2020-10-25 – 2020-10-27 (×3): 650 mg via ORAL
  Filled 2020-10-24 (×3): qty 2

## 2020-10-24 MED ORDER — VITAMIN D 25 MCG (1000 UNIT) PO TABS
2000.0000 [IU] | ORAL_TABLET | Freq: Every day | ORAL | Status: DC
  Administered 2020-10-27 – 2020-10-29 (×3): 2000 [IU] via ORAL
  Filled 2020-10-24 (×5): qty 2

## 2020-10-24 MED ORDER — MAGNESIUM OXIDE -MG SUPPLEMENT 400 (240 MG) MG PO TABS: 800.0000 mg | ORAL_TABLET | Freq: Every day | ORAL | Status: DC

## 2020-10-24 MED ORDER — ENSURE ENLIVE PO LIQD
237.0000 mL | Freq: Two times a day (BID) | ORAL | Status: DC
  Administered 2020-10-25 – 2020-10-28 (×3): 237 mL via ORAL

## 2020-10-24 MED ORDER — SODIUM CHLORIDE 0.9% FLUSH
3.0000 mL | Freq: Two times a day (BID) | INTRAVENOUS | Status: DC
  Administered 2020-10-24 – 2020-10-29 (×9): 3 mL via INTRAVENOUS

## 2020-10-24 MED ORDER — HYDROXYZINE HCL 10 MG PO TABS
10.0000 mg | ORAL_TABLET | Freq: Every day | ORAL | Status: DC
  Administered 2020-10-24 – 2020-10-28 (×5): 10 mg via ORAL
  Filled 2020-10-24 (×6): qty 1

## 2020-10-24 MED ORDER — MAGNESIUM OXIDE 400 (241.3 MG) MG PO TABS
800.0000 mg | ORAL_TABLET | Freq: Every day | ORAL | Status: DC
  Administered 2020-10-27 – 2020-10-29 (×3): 800 mg via ORAL
  Filled 2020-10-24 (×5): qty 2

## 2020-10-24 MED ORDER — OMEGA-3-ACID ETHYL ESTERS 1 G PO CAPS
1.0000 g | ORAL_CAPSULE | Freq: Every day | ORAL | Status: DC
  Administered 2020-10-28 – 2020-10-29 (×2): 1 g via ORAL
  Filled 2020-10-24 (×6): qty 1

## 2020-10-24 MED ORDER — VITAMIN D2 50 MCG (2000 UT) PO TABS: 2.0000 | ORAL_TABLET | Freq: Every day | ORAL | Status: DC

## 2020-10-24 MED ORDER — ONDANSETRON HCL 4 MG PO TABS: 4.0000 mg | ORAL_TABLET | Freq: Four times a day (QID) | ORAL | Status: DC | PRN

## 2020-10-24 MED ORDER — ACETAMINOPHEN 650 MG RE SUPP: 650.0000 mg | Freq: Four times a day (QID) | RECTAL | Status: DC | PRN

## 2020-10-24 MED ORDER — NIACIN ER (ANTIHYPERLIPIDEMIC) 500 MG PO TBCR
500.0000 mg | EXTENDED_RELEASE_TABLET | Freq: Every day | ORAL | Status: DC
  Administered 2020-10-24 – 2020-10-28 (×5): 500 mg via ORAL
  Filled 2020-10-24 (×6): qty 1

## 2020-10-24 MED ORDER — HEPARIN SODIUM (PORCINE) 5000 UNIT/ML IJ SOLN
5000.0000 [IU] | Freq: Three times a day (TID) | INTRAMUSCULAR | Status: DC
  Administered 2020-10-24 – 2020-10-27 (×7): 5000 [IU] via SUBCUTANEOUS
  Filled 2020-10-24 (×7): qty 1

## 2020-10-24 MED ORDER — LACTATED RINGERS IV BOLUS
1000.0000 mL | Freq: Once | INTRAVENOUS | Status: AC
  Administered 2020-10-24: 1000 mL via INTRAVENOUS

## 2020-10-24 MED ORDER — ASPIRIN EC 81 MG PO TBEC
81.0000 mg | DELAYED_RELEASE_TABLET | Freq: Every day | ORAL | Status: DC
  Administered 2020-10-27 – 2020-10-29 (×3): 81 mg via ORAL
  Filled 2020-10-24 (×5): qty 1

## 2020-10-24 NOTE — ED Provider Notes (Signed)
[facility] Emergency Department Provider Note ____________________________________________   Event Date/Time   First MD Initiated Contact with Patient 10/24/20 1116     (approximate)  I have reviewed the triage vital signs and the nursing notes.  HISTORY  Chief Complaint Loss of Consciousness   HPI Emily Faulkner is a 80 y.o. femalewho presents to the ED for evaluation of syncopal episode.  Chart review indicates history of diabetes on insulin, CKD, HLD, HTN, pulmonary hypertension. Recent outpatient echo performed on 4/5 with EF 55%.  Moderate mitral valve regurgitation and tricuspid valve regurgitation.  Patient reports that she lives at home alone, typically ambulates independently, but has been using a cane for the past couple weeks due to increasing weakness.  Patient reports a syncopal episode that occurred this morning.  She reports getting up to ambulate to the restroom to void, when she syncopized on route.  She reports that she has had some intermittent burning to her substernal chest with the past couple weeks with her weakness, but denies any discrete chest pain this morning.  She apparently was reports generalized weakness that been aggressive and constant over the past 2 weeks.  Denies recent fevers, abdominal pain.  She does report decreased p.o. intake and decreased output, sensation of dry mouth.  Denies abdominal pain, emesis or dysuria.  Reports today was her only syncopal episode.   Past Medical History:  Diagnosis Date  . Cancer [facility])    breast cancer right mastectomy  . Chronic kidney disease   . Diabetes mellitus without complication ([place])   . GIB (gastrointestinal bleeding) 02/20/2018  . Glaucoma   . Hyperlipidemia   . Hypertension   . Neuropathy   . Osteoarthritis   . Rheumatoid arthritis ([place])   . Telangiectasias     Patient Active Problem List   Diagnosis Date Noted  . Age-related osteoporosis without current  pathological fracture 05/09/2018  . Acute on chronic renal failure ([place]) 03/17/2018  . Renal mass 03/17/2018  . Microalbuminuric diabetic nephropathy ([place]) 11/02/2016  . Pulmonary hypertension ([place]) 09/11/2016  . Shortness of breath 09/06/2016  . Hypertension 09/06/2016  . High cholesterol 09/06/2016  . Anemia 09/06/2016  . Iron deficiency anemia 04/15/2016  . Type 2 diabetes mellitus with neurological manifestations, uncontrolled ([place]) 08/04/2014  . Hyperlipidemia, unspecified 01/22/2014  . Personal history of breast cancer 07/17/2012    Past Surgical History:  Procedure Laterality Date  . BREAST SURGERY     right mastectomy  . CHOLECYSTECTOMY    . COLONOSCOPY    . COLONOSCOPY WITH PROPOFOL N/A 08/14/2016   Procedure: COLONOSCOPY WITH PROPOFOL;  Surgeon: Lollie Sails, MD;  Location: [facility] ENDOSCOPY;  Service: Endoscopy;  Laterality: N/A;  . COLONOSCOPY WITH PROPOFOL N/A 02/22/2018   Procedure: COLONOSCOPY WITH PROPOFOL;  Surgeon: Jonathon Bellows, MD;  Location: [facility] ENDOSCOPY;  Service: Gastroenterology;  Laterality: N/A;  . ESOPHAGOGASTRODUODENOSCOPY (EGD) WITH PROPOFOL N/A 08/14/2016   Procedure: ESOPHAGOGASTRODUODENOSCOPY (EGD) WITH PROPOFOL;  Surgeon: Lollie Sails, MD;  Location: [facility] ENDOSCOPY;  Service: Endoscopy;  Laterality: N/A;  . ESOPHAGOGASTRODUODENOSCOPY (EGD) WITH PROPOFOL N/A 01/30/2018   Procedure: ESOPHAGOGASTRODUODENOSCOPY (EGD) WITH PROPOFOL;  Surgeon: Toledo, Benay Pike, MD;  Location: [facility] ENDOSCOPY;  Service: Gastroenterology;  Laterality: N/A;  . ESOPHAGOGASTRODUODENOSCOPY (EGD) WITH PROPOFOL N/A 02/22/2018   Procedure: ESOPHAGOGASTRODUODENOSCOPY (EGD) WITH PROPOFOL;  Surgeon: Jonathon Bellows, MD;  Location: [facility] ENDOSCOPY;  Service: Gastroenterology;  Laterality: N/A;  . EXCISIONAL HEMORRHOIDECTOMY    . KYPHOPLASTY N/A 03/20/2018   Procedure: [id number];  Surgeon: Hessie Knows, MD;  Location: [facility] ORS;  Service: Orthopedics;  Laterality: N/A;    Prior to  Admission medications   Medication Sig Start Date End Date Taking? Authorizing Provider  ACCU-CHEK AVIVA PLUS test strip  03/28/18   [provider]  ACCU-CHEK SOFTCLIX LANCETS lancets once daily Use as instructed. 04/14/18   [provider]  ACCU-CHEK SOFTCLIX LANCETS lancets ONCE DAILY USE AS INSTRUCTED. 04/23/18   [provider]  alendronate (FOSAMAX) 70 MG tablet TAKE 1 TABLET BY MOUTH EVERY 7DAYS WITH A FULL GLASS OF WATER. DO NOT LIE DOWN FOR 30 MINUTES 04/14/18   [provider]  aspirin 81 MG EC tablet Take 81 mg by mouth daily.     [provider]  BD AUTOSHIELD DUO 30G X 5 MM MISC  03/11/18   [provider]  BD PEN NEEDLE NANO U/F 32G X 4 MM MISC  03/24/18   [provider]  clindamycin (CLEOCIN T) 1 % external solution Apply 1 application topically 2 (two) times daily. 12/31/17   [provider]  clobetasol (TEMOVATE) 0.05 % external solution Apply 1 application topically every evening. (apply to scalp) 01/25/18   [provider]  colchicine 0.6 MG tablet Take 2 tabs PO x 1, then 1 tab PO 1 hour later x 1  Max: 1.8 mg total dose per attack, do not repeat within 3 days. 05/04/18   Menshew, Dannielle Karvonen, PA-C  Ergocalciferol (VITAMIN D2) 2000 units TABS Take 1 tablet by mouth daily.    [provider]  feeding supplement, ENSURE ENLIVE, (ENSURE ENLIVE) LIQD Take 237 mLs by mouth 2 (two) times daily between meals. 02/26/18   Fritzi Mandes, MD  ferrous fumarate (HEMOCYTE - 106 MG FE) 325 (106 Fe) MG TABS tablet Take 1 tablet (106 mg of iron total) by mouth 2 (two) times daily. 01/31/18   Hillary Bow, MD  glipiZIDE (GLUCOTROL) 10 MG tablet Take 10 mg by mouth daily.     [provider]  hydrOXYzine (ATARAX/VISTARIL) 10 MG tablet Take 10 mg by mouth at bedtime. 11/11/17   [provider]  Insulin Glargine (LANTUS SOLOSTAR) 100 UNIT/ML Solostar Pen Inject 36 Units into the skin daily. (may take  up to 50u based upon blood glucose reading)    [provider]  Insulin Pen Needle (BD PEN NEEDLE NANO U/F) 32G X 4 MM MISC USE 1 NEEDLE SUBCUTANEOUSLY ONCE A DAY AS DIRECTED 03/24/18   [provider]  ketoconazole (NIZORAL) 2 % cream  03/11/18   [provider]  ketoconazole (NIZORAL) 2 % shampoo Apply 1 application topically daily. (apply to scalp and wait at least 30 minutes before rinsing) 01/25/18   [provider]  Lidocaine 4 % PTCH Place onto the skin. 03/12/18   [provider]  loratadine (CLARITIN) 10 MG tablet Take 10 mg by mouth daily.    [provider]  losartan (COZAAR) 50 MG tablet TAKE 1/2 TABLET (25MG) BY MOUTH EVERY DAY 01/16/19   [provider]  magnesium oxide (MAG-OX) 400 MG tablet Take 800 mg by mouth daily. 10/02/17   [provider]  Magnesium Oxide 400 (240 Mg) MG TABS TAKE 2 TABLETS (800 MG TOTAL) BY MOUTH DAILY. 03/28/18   [provider]  metFORMIN (GLUCOPHAGE-XR) 500 MG 24 hr tablet  09/18/19   [provider]  methocarbamol (ROBAXIN) 500 MG tablet Take 500-1,500 mg by mouth See admin instructions. On day 1-3 take 1500 mg by mouth  three times daily, on days 4-6 take 1000 mg by mouth three times daily, then take 1000 mg by mouth three times daily as needed. 03/12/18   [provider]  niacin (NIASPAN) 500 MG CR tablet Take 500 mg by mouth every evening. 12/25/17   [provider]  Omega-3 Fatty Acids (FISH OIL) 1000 MG CAPS Take 1 capsule by mouth.     [provider]  pantoprazole (PROTONIX) 40 MG tablet Take 1 tablet (40 mg total) by mouth daily. 02/26/18   Fritzi Mandes, MD  potassium chloride SA (K-DUR,KLOR-CON) 20 MEQ tablet Take 20 mEq by mouth 2 (two) times daily. 03/11/18   [provider]  simvastatin (ZOCOR) 20 MG tablet Take 20 mg by mouth at bedtime. 12/23/17   [provider]  sitaGLIPtin (JANUVIA) 50 MG tablet Take 50 mg by mouth daily.     [provider]  traMADol (ULTRAM) 50 MG tablet Take by mouth.    [provider]  traMADol (ULTRAM) 50 MG tablet Take 1 tablet (50 mg total) by mouth 2 (two) times daily. 05/04/18   Menshew, Dannielle Karvonen, PA-C  TRAVATAN Z 0.004 % SOLN ophthalmic solution Place 1 drop into the left eye at bedtime. 12/05/17   [provider]  vitamin B-12 (CYANOCOBALAMIN) 1000 MCG tablet Take 1,000 mcg by mouth daily.    [provider]    Allergies Patient has no known allergies.  Family History  Problem Relation Age of Onset  . Kidney cancer Son   . Bladder Cancer Neg Hx     Social History Social History   Tobacco Use  . Smoking status: Former Smoker    Quit date: 07/10/2011    Years since quitting: 9.2  . Smokeless tobacco: Never Used  Vaping Use  . Vaping Use: Never used  Substance Use Topics  . Alcohol use: No  . Drug use: No    Review of Systems  Constitutional: No fever/chills.  Positive generalized weakness and syncopal episode Eyes: No visual changes. ENT: No sore throat. Cardiovascular: Positive for chest burning Respiratory: Denies shortness of breath. Gastrointestinal: No abdominal pain.  No nausea, no vomiting.  No diarrhea.  No constipation. Genitourinary: Negative for dysuria. Musculoskeletal: Negative for back pain. Skin: Negative for rash. Neurological: Negative for headaches, focal weakness or numbness.   ____________________________________________   PHYSICAL EXAM:  VITAL SIGNS: Vitals:   10/24/20 1230 10/24/20 1300  BP: (!) 129/33 (!) 130/36  Pulse: (!) 37 (!) 35  Resp: 15 (!) 21  Temp:    SpO2: 97% 99%     Constitutional: Alert and oriented.  Appears weak and tired in a generalized fashion, but conversational full sentences.  Fully oriented. Eyes: Conjunctivae are normal. PERRL. EOMI. Head: Atraumatic.  Superficial abrasion to her right cheek without discrete laceration or bleeding.  No deformity or significant  maxillary tenderness.  EOM intact without entrapment signs.  Teeth are well aligned. Nose: No congestion/rhinnorhea. Mouth/Throat: Mucous membranes are dry.  Oropharynx non-erythematous. Neck: No stridor. No cervical spine tenderness to palpation. Cardiovascular: Bradycardic rate, regular rhythm. Grossly normal heart sounds.  Good peripheral circulation.  Strong, symmetric and palpable peripheral pulses Respiratory: Normal respiratory effort.  No retractions. Lungs CTAB. Gastrointestinal: Soft , nondistended, nontender to palpation. No CVA tenderness. Musculoskeletal: No lower extremity tenderness nor edema.  No joint effusions.  Minimal bruising to her medial right ankle.  No tenderness or pain with active or passive ROM of the right ankle.  No evidence of open  injury. Neurologic:  Normal speech and language. No gross focal neurologic deficits are appreciated.  Cranial nerves II through XII intact 5/5 strength and sensation in all 4 extremities Skin:  Skin is warm, dry and intact. No rash noted. Psychiatric: Mood and affect are normal. Speech and behavior are normal.  ____________________________________________   LABS (all labs ordered are listed, but only abnormal results are displayed)  Labs Reviewed  COMPREHENSIVE METABOLIC PANEL - Abnormal; Notable for the following components:      Result Value   Potassium 5.2 (*)    CO2 15 (*)    Glucose, Bld 169 (*)    BUN 52 (*)    Creatinine, Ser 3.79 (*)    GFR, Estimated 12 (*)    All other components within normal limits  CBC WITH DIFFERENTIAL/PLATELET - Abnormal; Notable for the following components:   RBC 2.78 (*)    Hemoglobin 8.8 (*)    HCT 29.3 (*)    MCV 105.4 (*)    RDW 17.0 (*)    All other components within normal limits  MAGNESIUM - Abnormal; Notable for the following components:   Magnesium 2.8 (*)    All other components within normal limits  TROPONIN I (HIGH SENSITIVITY) - Abnormal; Notable for the following  components:   Troponin I (High Sensitivity) 23 (*)    All other components within normal limits  RESP PANEL BY RT-PCR (FLU A&B, COVID) [id number]  URINALYSIS, COMPLETE (UACMP) WITH MICROSCOPIC  TROPONIN I (HIGH SENSITIVITY)   ____________________________________________  12 Lead EKG  Undetermined rhythm with a variable AV block.  Ventricular rate of 40 bpm.  Leftward axis.  Prolonged QTC of 506 ms.  No STEMI criteria.  Some subtle inferior and lateral ST depressions. Quite different from EKG from 02/2018 with sinus rhythm and incomplete right bundle branch block, rate in the 90s.  Second twelve-lead EKG demonstrates complete heart block with ventricular rate of 37 bpm, and atrial rate of about 100.  Rightward axis no STEMI criteria.  Persistent subtle lateral ST depressions. ____________________________________________  RADIOLOGY  ED MD interpretation: CT head reviewed by me without evidence of acute intracranial pathology.   CXR reviewed by me with cardiomegaly without evidence of acute cardiopulmonary pathology.  Official radiology report(s): CT Head Wo Contrast  Result Date: 10/24/2020 CLINICAL DATA:  Mental status change. Unknown cause of syncopal episode on aspirin. EXAM: CT HEAD WITHOUT CONTRAST TECHNIQUE: Contiguous axial images were obtained from the base of the skull through the vertex without intravenous contrast. COMPARISON:  CT head 02/23/2018. FINDINGS: Brain: There is no evidence of acute intracranial hemorrhage, mass lesion, brain edema or extra-axial fluid collection. Stable mild atrophy with mild prominence of the ventricles and subarachnoid spaces. There are stable chronic small vessel ischemic changes in the periventricular white matter and a stable chronic posterior right parietal infarct. There is no CT evidence of acute cortical infarction. Vascular: Intracranial vascular calcifications. No hyperdense vessel identified. Skull: Negative for fracture or focal lesion.  Sinuses/Orbits: The visualized paranasal sinuses and mastoid air cells are clear. No orbital abnormalities are seen. Other: Previous bilateral lens surgery. IMPRESSION: 1. Stable head CT without evidence of acute intracranial process. 2. Stable atrophy and chronic small vessel ischemic changes as described. Electronically Signed   By: Richardean Sale M.D.   On: 10/24/2020 12:10   DG Chest Portable 1 View  Result Date: 10/24/2020 CLINICAL DATA:  Syncopal episode.  Bradycardia. EXAM: PORTABLE CHEST 1 VIEW COMPARISON:  Radiographs 02/24/2018 and 01/29/2018. FINDINGS: 1202 hours.  The heart size and mediastinal contours are stable. The heart size is stable at the upper limits of normal, and there is aortic atherosclerosis. The diffuse interstitial prominence seen previously has improved with mild subpleural reticulation similar to baseline radiographs. No confluent airspace opacity, pleural effusion or pneumothorax. There are surgical clips in the right axilla post right mastectomy. No acute osseous findings. Telemetry leads overlie the chest. IMPRESSION: Cardiomegaly with mild chronic interstitial lung disease. No acute cardiopulmonary process. Electronically Signed   By: Richardean Sale M.D.   On: 10/24/2020 12:14    ____________________________________________   PROCEDURES and INTERVENTIONS  Procedure(s) performed (including Critical Care):  .1-3 Lead EKG Interpretation Performed by: Vladimir Crofts, MD Authorized by: Vladimir Crofts, MD     Interpretation: abnormal     ECG rate:  40   ECG rate assessment: bradycardic     Rhythm: A-V block     Ectopy: none     Conduction: abnormal     Abnormal conduction: 3rd degree AV block    .Critical Care Performed by: Vladimir Crofts, MD Authorized by: Vladimir Crofts, MD   Critical care provider statement:    Critical care time (minutes):  45   Critical care was necessary to treat or prevent imminent or life-threatening deterioration of the following  conditions:  Cardiac failure and circulatory failure   Critical care was time spent personally by me on the following activities:  Discussions with consultants, evaluation of patient's response to treatment, examination of patient, ordering and performing treatments and interventions, ordering and review of laboratory studies, ordering and review of radiographic studies, pulse oximetry, re-evaluation of patient's condition, obtaining history from patient or surrogate and review of old charts    Medications  lactated ringers bolus 1,000 mL (1,000 mLs Intravenous New Bag/Given 10/24/20 1220)    ____________________________________________   MDM / ED COURSE   80 year old woman without significant cardiac history presents to the ED after syncopal episode on top of subacute weakness, found to have new onset complete heart block likely precipitating her symptoms, likely caused by AKI and mild hyperkalemia, requiring medical admission.  She maintains hemodynamic stability throughout her stay in the ED.  Persistent bradycardia in the 30s/40s for her ventricular rate, but maintains normotensive or slightly hypertensive pressures.  Exam without evidence of significant traumatic pathology beyond mild abrasion to her right cheekbone.  No neurologic or vascular deficits.  EKGs demonstrate evidence of new onset complete heart block.  Blood work with AKI and mild hyperkalemia to 5.2 that appears prerenal, and is consistent with her story of very poor p.o. intake over the past 2 weeks.  Discussed the case with cardiology, who has evaluated the patient.  No immediate indications for pacemaker placement, hopefully her symptoms will improve and heart block resolved with fluid resuscitation resolution of her AKI and hyperkalemia.  Initiated resuscitation here in the ED and we will discussed the case with hospitalist medicine for admission.  Patient is on no AV nodal blockers at baseline to contribute to her new heart  block.  Clinical Course as of 10/24/20 [place] Oct 24, 2020  1138 Reassessed. HR in the 60s now. I ask RN to get another EKG. Clinically the same [DS]  1146 Repeat EKG is very similar.  Her rate remains around 40 bpm. Repeat EKG is more consistent with complete heart block, particular the rhythm strip for V1. [DS]  [place] Dr. Ubaldo Glassing paged [DS]  1153 Dr. Ubaldo Glassing will come evaluate [DS]  1215 Dr. Ubaldo Glassing in to  see the patient [DS]  1460 [id number].  Remains stable.  Son at the bedside.  We discussed admission, AKI, hyperkalemia and complete heart block. [DS]    Clinical Course User Index [DS] Vladimir Crofts, MD    ____________________________________________   FINAL CLINICAL IMPRESSION(S) / ED DIAGNOSES  Final diagnoses:  Complete heart block ([place])  Syncope and collapse  AKI (acute kidney injury) [facility])     ED Discharge Orders    None       Dianne Bady   Note:  This document was prepared using Dragon voice recognition software and may include unintentional dictation errors.   Vladimir Crofts, MD 10/24/20 1352

## 2020-10-24 NOTE — H&P (Signed)
History and Physical    Emily Faulkner EYC:[id number] DOB: 1941/03/31 DOA: 10/24/2020  PCP: Sofie Hartigan, MD   Patient coming from: Home  I have personally briefly reviewed patient's old medical records in [place]  Chief Complaint: " I passed out'  HPI: Emily Faulkner is a 80 y.o. female with medical history significant for diabetes mellitus with complications of stage III chronic kidney disease, hypertension, pulmonary hypertension and dyslipidemia who presents to the emergency room by EMS for evaluation of a syncopal episode. Patient states that she has not felt well for the last couple of days and has had very poor oral intake.  Over the last 24 hours she developed diarrhea and states that she got up this morning to go use the bathroom when she felt very lightheaded and dizzy and fell.  She is unable to tell me how long she was down for her but she hit her life alert as she was hitting the ground prompting EMS to respond. She was able to ambulate with assistance to the EMS truck and was transported to the emergency room. She states that she has felt weak for the last couple of days and has had a burning sensation in her chest (she thinks more esophagus with swallowing).  She denies having any palpitations, no diaphoresis, no fever, no chills, no headache, no urinary frequency, no nocturia, no dysuria, no abdominal pain, no shortness of breath, no nausea, no vomiting, no urinary symptoms. Labs show sodium 136, potassium 5.2, chloride 107, bicarb 15, glucose 169, BUN 52, creatinine 3.79, calcium 9.1, magnesium 2.8, alkaline phosphatase 55, albumin 3.6, AST 30, ALT 13, total protein 6.9, total bilirubin 1.0, troponin 23 >> 26, white count 9.3, hemoglobin 8.8, hematocrit 29.3, MCV 105, RDW 17.0, platelet count 205 CT scan of the head without contrast shows stable head CT without evidence of acute intracranial process. Stable atrophy and chronic small vessel ischemic changes as  described. Chest x-ray reviewed by me shows cardiomegaly with mild chronic interstitial lung disease. No acute cardiopulmonary process. Twelve-lead EKG reviewed by me shows a complete heart block   ED Course: Patient is a 80 year old Caucasian female who was brought into the ER by EMS for evaluation after a syncopal episode.  Patient has had poor oral intake for several days and also has loose watery stools. Labs show worsening of her renal function from baseline, at baseline she has a serum creatinine of 1.25 and today on admission her serum creatinine is 3.79. She also has an anion gap metabolic acidosis. Cardiology was consulted in the ER and patient will be referred to observation status for further evaluation.   Review of Systems: As per HPI otherwise all other systems reviewed and negative.    Past Medical History:  Diagnosis Date  . Cancer [facility])    breast cancer right mastectomy  . Chronic kidney disease   . Diabetes mellitus without complication ([place])   . GIB (gastrointestinal bleeding) 02/20/2018  . Glaucoma   . Hyperlipidemia   . Hypertension   . Neuropathy   . Osteoarthritis   . Rheumatoid arthritis ([place])   . Telangiectasias     Past Surgical History:  Procedure Laterality Date  . BREAST SURGERY     right mastectomy  . CHOLECYSTECTOMY    . COLONOSCOPY    . COLONOSCOPY WITH PROPOFOL N/A 08/14/2016   Procedure: COLONOSCOPY WITH PROPOFOL;  Surgeon: Lollie Sails, MD;  Location: [facility] ENDOSCOPY;  Service: Endoscopy;  Laterality: N/A;  .  COLONOSCOPY WITH PROPOFOL N/A 02/22/2018   Procedure: COLONOSCOPY WITH PROPOFOL;  Surgeon: Jonathon Bellows, MD;  Location: [facility] ENDOSCOPY;  Service: Gastroenterology;  Laterality: N/A;  . ESOPHAGOGASTRODUODENOSCOPY (EGD) WITH PROPOFOL N/A 08/14/2016   Procedure: ESOPHAGOGASTRODUODENOSCOPY (EGD) WITH PROPOFOL;  Surgeon: Lollie Sails, MD;  Location: [facility] ENDOSCOPY;  Service: Endoscopy;  Laterality: N/A;  . ESOPHAGOGASTRODUODENOSCOPY (EGD)  WITH PROPOFOL N/A 01/30/2018   Procedure: ESOPHAGOGASTRODUODENOSCOPY (EGD) WITH PROPOFOL;  Surgeon: Toledo, Benay Pike, MD;  Location: [facility] ENDOSCOPY;  Service: Gastroenterology;  Laterality: N/A;  . ESOPHAGOGASTRODUODENOSCOPY (EGD) WITH PROPOFOL N/A 02/22/2018   Procedure: ESOPHAGOGASTRODUODENOSCOPY (EGD) WITH PROPOFOL;  Surgeon: Jonathon Bellows, MD;  Location: [facility] ENDOSCOPY;  Service: Gastroenterology;  Laterality: N/A;  . EXCISIONAL HEMORRHOIDECTOMY    . KYPHOPLASTY N/A 03/20/2018   Procedure: [id number];  Surgeon: Hessie Knows, MD;  Location: [facility] ORS;  Service: Orthopedics;  Laterality: N/A;     reports that she quit smoking about 9 years ago. She has never used smokeless tobacco. She reports that she does not drink alcohol and does not use drugs.  No Known Allergies  Family History  Problem Relation Age of Onset  . Kidney cancer Son   . Bladder Cancer Neg Hx       Prior to Admission medications   Medication Sig Start Date End Date Taking? Authorizing Provider  ACCU-CHEK AVIVA PLUS test strip  03/28/18   [provider]  ACCU-CHEK SOFTCLIX LANCETS lancets once daily Use as instructed. 04/14/18   [provider]  ACCU-CHEK SOFTCLIX LANCETS lancets ONCE DAILY USE AS INSTRUCTED. 04/23/18   [provider]  alendronate (FOSAMAX) 70 MG tablet TAKE 1 TABLET BY MOUTH EVERY 7DAYS WITH A FULL GLASS OF WATER. DO NOT LIE DOWN FOR 30 MINUTES 04/14/18   [provider]  aspirin 81 MG EC tablet Take 81 mg by mouth daily.     [provider]  BD AUTOSHIELD DUO 30G X 5 MM MISC  03/11/18   [provider]  BD PEN NEEDLE NANO U/F 32G X 4 MM MISC  03/24/18   [provider]  clindamycin (CLEOCIN T) 1 % external solution Apply 1 application topically 2 (two) times daily. 12/31/17   [provider]  clobetasol (TEMOVATE) 0.05 % external solution Apply 1 application topically every evening. (apply to scalp) 01/25/18   [provider]  colchicine 0.6 MG tablet Take 2 tabs PO x 1, then 1 tab PO 1 hour later x 1  Max: 1.8 mg total dose per attack, do not repeat within 3 days. 05/04/18   Menshew, Dannielle Karvonen, PA-C  Ergocalciferol (VITAMIN D2) 2000 units TABS Take 1 tablet by mouth daily.    [provider]  feeding supplement, ENSURE ENLIVE, (ENSURE ENLIVE) LIQD Take 237 mLs by mouth 2 (two) times daily between meals. 02/26/18   Fritzi Mandes, MD  ferrous fumarate (HEMOCYTE - 106 MG FE) 325 (106 Fe) MG TABS tablet Take 1 tablet (106 mg of iron total) by mouth 2 (two) times daily. 01/31/18   Hillary Bow, MD  glipiZIDE (GLUCOTROL) 10 MG tablet Take 10 mg by mouth daily.     [provider]  hydrOXYzine (ATARAX/VISTARIL) 10 MG tablet Take 10 mg by mouth at bedtime. 11/11/17   [provider]  Insulin Glargine (LANTUS SOLOSTAR) 100 UNIT/ML Solostar Pen Inject 36 Units into the skin daily. (may take up to 50u based upon blood glucose reading)    [provider]  Insulin Pen Needle (BD PEN NEEDLE NANO  U/F) 32G X 4 MM MISC USE 1 NEEDLE SUBCUTANEOUSLY ONCE A DAY AS DIRECTED 03/24/18   [provider]  ketoconazole (NIZORAL) 2 % cream  03/11/18   [provider]  ketoconazole (NIZORAL) 2 % shampoo Apply 1 application topically daily. (apply to scalp and wait at least 30 minutes before rinsing) 01/25/18   [provider]  Lidocaine 4 % PTCH Place onto the skin. 03/12/18   [provider]  loratadine (CLARITIN) 10 MG tablet Take 10 mg by mouth daily.    [provider]  losartan (COZAAR) 50 MG tablet TAKE 1/2 TABLET (25MG) BY MOUTH EVERY DAY 01/16/19   [provider]  magnesium oxide (MAG-OX) 400 MG tablet Take 800 mg by mouth daily. 10/02/17   [provider]  Magnesium Oxide 400 (240 Mg) MG TABS TAKE 2 TABLETS (800 MG TOTAL) BY MOUTH DAILY. 03/28/18   [provider]  metFORMIN (GLUCOPHAGE-XR) 500 MG 24 hr tablet  09/18/19   [provider]  methocarbamol (ROBAXIN) 500 MG tablet Take 500-1,500 mg by mouth See admin instructions. On day 1-3 take 1500 mg by mouth three times daily, on days 4-6 take 1000 mg by mouth three times daily, then take 1000 mg by mouth three times daily as needed. 03/12/18   [provider]  niacin (NIASPAN) 500 MG CR tablet Take 500 mg by mouth every evening. 12/25/17   [provider]  Omega-3 Fatty Acids (FISH OIL) 1000 MG CAPS Take 1 capsule by mouth.     [provider]  pantoprazole (PROTONIX) 40 MG tablet Take 1 tablet (40 mg total) by mouth daily. 02/26/18   Fritzi Mandes, MD  potassium chloride SA (K-DUR,KLOR-CON) 20 MEQ tablet Take 20 mEq by mouth 2 (two) times daily. 03/11/18   [provider]  simvastatin (ZOCOR) 20 MG tablet Take 20 mg by mouth at bedtime. 12/23/17   [provider]  sitaGLIPtin (JANUVIA) 50 MG tablet Take 50 mg by mouth daily.    [provider]  traMADol (ULTRAM) 50 MG tablet Take by mouth.    [provider]  traMADol (ULTRAM) 50 MG tablet Take 1 tablet (50 mg total) by mouth 2 (two) times daily. 05/04/18   Menshew, Dannielle Karvonen, PA-C  TRAVATAN Z 0.004 % SOLN ophthalmic solution Place 1 drop into the left eye at bedtime. 12/05/17   [provider]  vitamin B-12 (CYANOCOBALAMIN) 1000 MCG tablet Take 1,000 mcg by mouth daily.    [provider]    Physical Exam: Vitals:   10/24/20 1120 10/24/20 1230 10/24/20 1300 10/24/20 1400  BP:  (!) 129/33 (!) 130/36 108/78  Pulse: 61 (!) 37 (!) 35 (!) 52  Resp:  15 (!) 21 17  Temp:      TempSrc:      SpO2: 99% 97% 99% 91%  Weight:      Height:         Vitals:   10/24/20 1120 10/24/20 1230 10/24/20 1300 10/24/20 1400  BP:  (!) 129/33 (!) 130/36 108/78  Pulse: 61 (!) 37 (!) 35 (!) 52  Resp:  15 (!) 21 17  Temp:      TempSrc:      SpO2: 99% 97% 99% 91%  Weight:      Height:          Constitutional: Alert and oriented x 3 . Not in any  apparent distress.  Chronically ill-appearing HEENT:      Head: Normocephalic and  atraumatic.         Eyes: PERLA, EOMI, Conjunctivae are normal. Sclera is non-icteric.       Mouth/Throat: Mucous membranes are dry.       Neck: Supple with no signs of meningismus. Cardiovascular: Regular rate and rhythm. No murmurs, gallops, or rubs. 2+ symmetrical distal pulses are present . No JVD. No LE edema Respiratory: Respiratory effort normal .Lungs sounds clear bilaterally. No wheezes, crackles, or rhonchi.  Gastrointestinal: Soft, non tender, and non distended with positive bowel sounds.  Genitourinary: No CVA tenderness. Musculoskeletal: Nontender with normal range of motion in all extremities. No cyanosis, or erythema of extremities. Neurologic:  Face is symmetric. Moving all extremities. No gross focal neurologic deficits.  Generalized weakness Skin: Skin is warm, dry.  Ecchymosis over her right cheek, knuckles on both right and left hands as well as her knees Psychiatric: Mood and affect are normal   Labs on Admission: I have personally reviewed following labs and imaging studies  CBC: Recent Labs  Lab 10/24/20 1130  WBC 9.3  NEUTROABS 7.3  HGB 8.8*  HCT 29.3*  MCV 105.4*  PLT 007   Basic Metabolic Panel: Recent Labs  Lab 10/24/20 1130  NA 136  K 5.2*  CL 107  CO2 15*  GLUCOSE 169*  BUN 52*  CREATININE 3.79*  CALCIUM 9.1  MG 2.8*   GFR: Estimated Creatinine Clearance: 10.6 mL/min (A) (by C-G formula based on SCr of 3.79 mg/dL (H)). Liver Function Tests: Recent Labs  Lab 10/24/20 1130  AST 30  ALT 13  ALKPHOS 55  BILITOT 1.0  PROT 6.9  ALBUMIN 3.6   No results for input(s): LIPASE, AMYLASE in the last 168 hours. No results for input(s): AMMONIA in the last 168 hours. Coagulation Profile: No results for input(s): INR, PROTIME in the last 168 hours. Cardiac Enzymes: No results for input(s): CKTOTAL, CKMB, CKMBINDEX, TROPONINI in the last 168 hours. BNP (last 3  results) No results for input(s): PROBNP in the last 8760 hours. HbA1C: No results for input(s): HGBA1C in the last 72 hours. CBG: No results for input(s): GLUCAP in the last 168 hours. Lipid Profile: No results for input(s): CHOL, HDL, LDLCALC, TRIG, CHOLHDL, LDLDIRECT in the last 72 hours. Thyroid Function Tests: No results for input(s): TSH, T4TOTAL, FREET4, T3FREE, THYROIDAB in the last 72 hours. Anemia Panel: No results for input(s): VITAMINB12, FOLATE, FERRITIN, TIBC, IRON, RETICCTPCT in the last 72 hours. Urine analysis:    Component Value Date/Time   COLORURINE YELLOW (A) 03/17/2018 1850   APPEARANCEUR HAZY (A) 03/17/2018 1850   LABSPEC 1.014 03/17/2018 1850   PHURINE 5.0 03/17/2018 1850   GLUCOSEU NEGATIVE 03/17/2018 1850   HGBUR NEGATIVE 03/17/2018 1850   BILIRUBINUR NEGATIVE 03/17/2018 1850   KETONESUR NEGATIVE 03/17/2018 1850   PROTEINUR NEGATIVE 03/17/2018 1850   NITRITE NEGATIVE 03/17/2018 1850   LEUKOCYTESUR NEGATIVE 03/17/2018 1850    Radiological Exams on Admission: CT Head Wo Contrast  Result Date: 10/24/2020 CLINICAL DATA:  Mental status change. Unknown cause of syncopal episode on aspirin. EXAM: CT HEAD WITHOUT CONTRAST TECHNIQUE: Contiguous axial images were obtained from the base of the skull through the vertex without intravenous contrast. COMPARISON:  CT head 02/23/2018. FINDINGS: Brain: There is no evidence of acute intracranial hemorrhage, mass lesion, brain edema or extra-axial fluid collection. Stable mild atrophy with mild prominence of the ventricles and subarachnoid spaces. There are stable chronic small vessel ischemic changes in the periventricular white matter and a stable chronic posterior right parietal  infarct. There is no CT evidence of acute cortical infarction. Vascular: Intracranial vascular calcifications. No hyperdense vessel identified. Skull: Negative for fracture or focal lesion. Sinuses/Orbits: The visualized paranasal sinuses and mastoid  air cells are clear. No orbital abnormalities are seen. Other: Previous bilateral lens surgery. IMPRESSION: 1. Stable head CT without evidence of acute intracranial process. 2. Stable atrophy and chronic small vessel ischemic changes as described. Electronically Signed   By: Richardean Sale M.D.   On: 10/24/2020 12:10   DG Chest Portable 1 View  Result Date: 10/24/2020 CLINICAL DATA:  Syncopal episode.  Bradycardia. EXAM: PORTABLE CHEST 1 VIEW COMPARISON:  Radiographs 02/24/2018 and 01/29/2018. FINDINGS: 1202 hours. The heart size and mediastinal contours are stable. The heart size is stable at the upper limits of normal, and there is aortic atherosclerosis. The diffuse interstitial prominence seen previously has improved with mild subpleural reticulation similar to baseline radiographs. No confluent airspace opacity, pleural effusion or pneumothorax. There are surgical clips in the right axilla post right mastectomy. No acute osseous findings. Telemetry leads overlie the chest. IMPRESSION: Cardiomegaly with mild chronic interstitial lung disease. No acute cardiopulmonary process. Electronically Signed   By: Richardean Sale M.D.   On: 10/24/2020 12:14     Assessment/Plan Principal Problem:   Syncope and collapse Active Problems:   Hypertension   AKI (acute kidney injury) ([place])   Complete heart block (H[facility])     Syncope and collapse Most likely secondary to complete heart block Patient was bradycardic upon arrival to the ER and twelve-lead EKG showed a complete heart She has mild hyperkalemia as well as acute kidney injury region may be responsible for the complete heart block We will place patient on a bicarbonate infusion Repeat renal parameters in a.m.    Acute kidney injury with anion gap metabolic acidosis/hyperkalemia Secondary to GI losses from diarrhea as well as poor oral intake At baseline patient has a serum creatinine of 1.25 and today on admission her serum creatinine is 3.79  with an elevated BUN level at 73 Hold Cozaar for now We will place patient on a bicarbonate infusion Repeat renal parameters in am We will consult nephrology if there is no improvement in patient's renal function   Diabetes mellitus with complications of stage III chronic kidney disease Maintain consistent carbohydrate Hold oral hypoglycemic agents Glycemic control with insulin    Hypertension Hold Cozaar since patient is normotensive    Odynophagia Most likely related to Fosamax use Patient complains of pain with swallowing Hold Fosamax Continue PPI   DVT prophylaxis: Heparin Code Status: full code Family Communication: Greater than 50% of the time spent discussing patient's condition and plan of care with her and her son at the bedside.  All questions and concerns have been addressed. They verbalize understanding and agrees with the plan Disposition Plan: Back to previous home environment Consults called: Cardiology Status: Observation    Skye Plamondon MD [facility]     10/24/2020, 3:19 PM

## 2020-10-24 NOTE — ED Triage Notes (Addendum)
Patient arrived by EMS from home after syncopal episode. Lives alone. Patient got up to go to restroom and felt weak then had syncopal episode occurred. HR in 40s. HX vasectomy on right side. Reports has diarrhea often for a long time

## 2020-10-24 NOTE — ED Notes (Signed)
Patient transported to CT 

## 2020-10-24 NOTE — Consult Note (Signed)
Cardiology Consultation Note    Patient ID: Emily Faulkner, MRN: [id number], DOB/AGE: 03-18-1941 80 y.o. Admit date: 10/24/2020   Date of Consult: 10/24/2020 Primary Physician: Sofie Hartigan, MD Primary Cardiologist: none  Chief Complaint: syncope Reason for Consultation: bradycardia and high grade heart block Requesting MD: Dr. Francine Graven  HPI: Emily Faulkner is a 80 y.o. female with history of diabetes with stage III chronic kidney disease, hypertension, pulmonary hypertension and dyslipidemia who presented to the emergency room after a syncopal episode.  She has not felt well over the last couple of days and has had very poor fluid and oral intake.  She also has had diarrhea and when getting up to go the bathroom had a syncopal episode after dizziness.  In the emergency room she had hyperkalemia with potassium 5.2, BUN of 52 a creatinine of 3.79 from a baseline of approximately 1.3, magnesium of 2.8 platelet count of 205,000 with an hemoglobin of 8.8.  Twelve-lead EKG suggested A-V dissociation with ventricular escape in the mid 30s.  She is on no AV nodal medications.  Her heart rate improved somewhat with hydration.  At the time of my exam she had a ventricular rate between 50 and 60.  She was hemodynamically stable.  Past Medical History:  Diagnosis Date  . Cancer [facility])    breast cancer right mastectomy  . Chronic kidney disease   . Diabetes mellitus without complication ([place])   . GIB (gastrointestinal bleeding) 02/20/2018  . Glaucoma   . Hyperlipidemia   . Hypertension   . Neuropathy   . Osteoarthritis   . Rheumatoid arthritis ([place])   . Telangiectasias       Surgical History:  Past Surgical History:  Procedure Laterality Date  . BREAST SURGERY     right mastectomy  . CHOLECYSTECTOMY    . COLONOSCOPY    . COLONOSCOPY WITH PROPOFOL N/A 08/14/2016   Procedure: COLONOSCOPY WITH PROPOFOL;  Surgeon: Lollie Sails, MD;  Location: [facility] ENDOSCOPY;  Service: Endoscopy;   Laterality: N/A;  . COLONOSCOPY WITH PROPOFOL N/A 02/22/2018   Procedure: COLONOSCOPY WITH PROPOFOL;  Surgeon: Jonathon Bellows, MD;  Location: [facility] ENDOSCOPY;  Service: Gastroenterology;  Laterality: N/A;  . ESOPHAGOGASTRODUODENOSCOPY (EGD) WITH PROPOFOL N/A 08/14/2016   Procedure: ESOPHAGOGASTRODUODENOSCOPY (EGD) WITH PROPOFOL;  Surgeon: Lollie Sails, MD;  Location: [facility] ENDOSCOPY;  Service: Endoscopy;  Laterality: N/A;  . ESOPHAGOGASTRODUODENOSCOPY (EGD) WITH PROPOFOL N/A 01/30/2018   Procedure: ESOPHAGOGASTRODUODENOSCOPY (EGD) WITH PROPOFOL;  Surgeon: Toledo, Benay Pike, MD;  Location: [facility] ENDOSCOPY;  Service: Gastroenterology;  Laterality: N/A;  . ESOPHAGOGASTRODUODENOSCOPY (EGD) WITH PROPOFOL N/A 02/22/2018   Procedure: ESOPHAGOGASTRODUODENOSCOPY (EGD) WITH PROPOFOL;  Surgeon: Jonathon Bellows, MD;  Location[facility] ENDOSCOPY;  Service: Gastroenterology;  Laterality: N/A;  . EXCISIONAL HEMORRHOIDECTOMY    . KYPHOPLASTY N/A 03/20/2018   Procedure: [id number];  Surgeon: Hessie Knows, MD;  Location: [facility] ORS;  Service: Orthopedics;  Laterality: N/A;     Home Meds: Prior to Admission medications   Medication Sig Start Date End Date Taking? Authorizing Provider  ACCU-CHEK AVIVA PLUS test strip  03/28/18   [provider]  ACCU-CHEK SOFTCLIX LANCETS lancets once daily Use as instructed. 04/14/18   [provider]  ACCU-CHEK SOFTCLIX LANCETS lancets ONCE DAILY USE AS INSTRUCTED. 04/23/18   [provider]  alendronate (FOSAMAX) 70 MG tablet TAKE 1 TABLET BY MOUTH EVERY 7DAYS WITH A FULL GLASS OF WATER. DO NOT LIE DOWN FOR 30 MINUTES 04/14/18   [provider]  aspirin 81  MG EC tablet Take 81 mg by mouth daily.     [provider]  BD AUTOSHIELD DUO 30G X 5 MM MISC  03/11/18   [provider]  BD PEN NEEDLE NANO U/F 32G X 4 MM MISC  03/24/18   [provider]  clindamycin (CLEOCIN T) 1 % external solution Apply 1 application topically 2 (two)  times daily. 12/31/17   [provider]  clobetasol (TEMOVATE) 0.05 % external solution Apply 1 application topically every evening. (apply to scalp) 01/25/18   [provider]  colchicine 0.6 MG tablet Take 2 tabs PO x 1, then 1 tab PO 1 hour later x 1  Max: 1.8 mg total dose per attack, do not repeat within 3 days. 05/04/18   Menshew, Dannielle Karvonen, PA-C  Ergocalciferol (VITAMIN D2) 2000 units TABS Take 1 tablet by mouth daily.    [provider]  feeding supplement, ENSURE ENLIVE, (ENSURE ENLIVE) LIQD Take 237 mLs by mouth 2 (two) times daily between meals. 02/26/18   Fritzi Mandes, MD  ferrous fumarate (HEMOCYTE - 106 MG FE) 325 (106 Fe) MG TABS tablet Take 1 tablet (106 mg of iron total) by mouth 2 (two) times daily. 01/31/18   Hillary Bow, MD  glipiZIDE (GLUCOTROL) 10 MG tablet Take 10 mg by mouth daily.     [provider]  hydrOXYzine (ATARAX/VISTARIL) 10 MG tablet Take 10 mg by mouth at bedtime. 11/11/17   [provider]  Insulin Glargine (LANTUS SOLOSTAR) 100 UNIT/ML Solostar Pen Inject 36 Units into the skin daily. (may take up to 50u based upon blood glucose reading)    [provider]  Insulin Pen Needle (BD PEN NEEDLE NANO U/F) 32G X 4 MM MISC USE 1 NEEDLE SUBCUTANEOUSLY ONCE A DAY AS DIRECTED 03/24/18   [provider]  ketoconazole (NIZORAL) 2 % cream  03/11/18   [provider]  ketoconazole (NIZORAL) 2 % shampoo Apply 1 application topically daily. (apply to scalp and wait at least 30 minutes before rinsing) 01/25/18   [provider]  Lidocaine 4 % PTCH Place onto the skin. 03/12/18   [provider]  loratadine (CLARITIN) 10 MG tablet Take 10 mg by mouth daily.    [provider]  losartan (COZAAR) 50 MG tablet TAKE 1/2 TABLET (25MG) BY MOUTH EVERY DAY 01/16/19   [provider]  magnesium oxide (MAG-OX) 400 MG tablet Take 800 mg by mouth daily. 10/02/17   [provider]   Magnesium Oxide 400 (240 Mg) MG TABS TAKE 2 TABLETS (800 MG TOTAL) BY MOUTH DAILY. 03/28/18   [provider]  metFORMIN (GLUCOPHAGE-XR) 500 MG 24 hr tablet  09/18/19   [provider]  methocarbamol (ROBAXIN) 500 MG tablet Take 500-1,500 mg by mouth See admin instructions. On day 1-3 take 1500 mg by mouth three times daily, on days 4-6 take 1000 mg by mouth three times daily, then take 1000 mg by mouth three times daily as needed. 03/12/18   [provider]  niacin (NIASPAN) 500 MG CR tablet Take 500 mg by mouth every evening. 12/25/17   [provider]  Omega-3 Fatty Acids (FISH OIL) 1000 MG CAPS Take 1 capsule by mouth.     [provider]  pantoprazole (PROTONIX) 40 MG tablet Take 1 tablet (40 mg total) by mouth daily. 02/26/18   Fritzi Mandes, MD  potassium chloride SA (K-DUR,KLOR-CON) 20 MEQ tablet Take 20 mEq by mouth 2 (two) times daily. 03/11/18  [provider]  simvastatin (ZOCOR) 20 MG tablet Take 20 mg by mouth at bedtime. 12/23/17   [provider]  sitaGLIPtin (JANUVIA) 50 MG tablet Take 50 mg by mouth daily.    [provider]  traMADol (ULTRAM) 50 MG tablet Take by mouth.    [provider]  traMADol (ULTRAM) 50 MG tablet Take 1 tablet (50 mg total) by mouth 2 (two) times daily. 05/04/18   Menshew, Dannielle Karvonen, PA-C  TRAVATAN Z 0.004 % SOLN ophthalmic solution Place 1 drop into the left eye at bedtime. 12/05/17   [provider]  vitamin B-12 (CYANOCOBALAMIN) 1000 MCG tablet Take 1,000 mcg by mouth daily.    [provider]    Inpatient Medications:  . aspirin  81 mg Oral Daily  . feeding supplement  237 mL Oral BID BM  . ferrous fumarate  1 tablet Oral BID  . heparin  5,000 Units Subcutaneous Q8H  . hydrOXYzine  10 mg Oral QHS  . insulin aspart  0-15 Units Subcutaneous TID WC  . latanoprost  1 drop Left Eye QHS  . loratadine  10 mg Oral Daily  . magnesium oxide  800 mg Oral Daily  .  Magnesium Oxide  800 mg Oral Daily  . niacin  500 mg Oral QPM  . omega-3 acid ethyl esters  1 g Oral Daily  . pantoprazole  40 mg Oral Daily  . simvastatin  20 mg Oral QHS  . sodium chloride flush  3 mL Intravenous Q12H  . vitamin B-12  1,000 mcg Oral Daily  . Vitamin D2  1 tablet Oral Daily   .  sodium bicarbonate (isotonic) infusion in sterile water      Allergies: No Known Allergies  Social History   Socioeconomic History  . Marital status: Married    Spouse name: Not on file  . Number of children: Not on file  . Years of education: Not on file  . Highest education level: Not on file  Occupational History  . Not on file  Tobacco Use  . Smoking status: Former Smoker    Quit date: 07/10/2011    Years since quitting: 9.2  . Smokeless tobacco: Never Used  Vaping Use  . Vaping Use: Never used  Substance and Sexual Activity  . Alcohol use: No  . Drug use: No  . Sexual activity: Not on file  Other Topics Concern  . Not on file  Social History Narrative  . Not on file   Social Determinants of Health   Financial Resource Strain: Not on file  Food Insecurity: Not on file  Transportation Needs: Not on file  Physical Activity: Not on file  Stress: Not on file  Social Connections: Not on file  Intimate Partner Violence: Not on file     Family History  Problem Relation Age of Onset  . Kidney cancer Son   . Bladder Cancer Neg Hx      Review of Systems: A 12-system review of systems was performed and is negative except as noted in the HPI.  Labs: No results for input(s): CKTOTAL, CKMB, TROPONINI in the last 72 hours. Lab Results  Component Value Date   WBC 9.3 10/24/2020   HGB 8.8 (L) 10/24/2020   HCT 29.3 (L) 10/24/2020   MCV 105.4 (H) 10/24/2020   PLT 205 10/24/2020    Recent Labs  Lab 10/24/20 1130  NA 136  K 5.2*  CL 107  CO2 15*  BUN 52*  CREATININE 3.79*  CALCIUM 9.1  PROT 6.9  BILITOT 1.0  ALKPHOS 55  ALT 13  AST 30  GLUCOSE 169*   No  results found for: CHOL, HDL, LDLCALC, TRIG No results found for: DDIMER  Radiology/Studies:  CT Head Wo Contrast  Result Date: 10/24/2020 CLINICAL DATA:  Mental status change. Unknown cause of syncopal episode on aspirin. EXAM: CT HEAD WITHOUT CONTRAST TECHNIQUE: Contiguous axial images were obtained from the base of the skull through the vertex without intravenous contrast. COMPARISON:  CT head 02/23/2018. FINDINGS: Brain: There is no evidence of acute intracranial hemorrhage, mass lesion, brain edema or extra-axial fluid collection. Stable mild atrophy with mild prominence of the ventricles and subarachnoid spaces. There are stable chronic small vessel ischemic changes in the periventricular white matter and a stable chronic posterior right parietal infarct. There is no CT evidence of acute cortical infarction. Vascular: Intracranial vascular calcifications. No hyperdense vessel identified. Skull: Negative for fracture or focal lesion. Sinuses/Orbits: The visualized paranasal sinuses and mastoid air cells are clear. No orbital abnormalities are seen. Other: Previous bilateral lens surgery. IMPRESSION: 1. Stable head CT without evidence of acute intracranial process. 2. Stable atrophy and chronic small vessel ischemic changes as described. Electronically Signed   By: Richardean Sale M.D.   On: 10/24/2020 12:10   US RENAL  Result Date: 10/17/2020 CLINICAL DATA:  Surveillance of left renal mass. EXAM: RENAL / URINARY TRACT ULTRASOUND COMPLETE COMPARISON:  Prior yearly ultrasound studies dating back 2019. FINDINGS: Right Kidney: Renal measurements: 9.3 x 3.6 x 4.1 cm = volume: 73 mL. Stable mild atrophy. No hydronephrosis. No right-sided renal lesions identified by ultrasound. Left Kidney: Renal measurements: 9.7 x 4.4 x 5.3 cm = volume: 118 mL. Interpolar mass centered at the level of the renal cortex currently measures approximately 2.9 x 2.8 x 2.5 cm and shows gradual enlargement since initial detection.  Bladder: Appears normal for degree of bladder distention. Other: None. IMPRESSION: Enlargement of interpolar left renal mass by ultrasound over time with maximal diameter currently estimated to be approximately 2.9 cm. Further characterization and more accurate measurements may be possible by CT or MRI. Electronically Signed   By: Aletta Edouard M.D.   On: 10/17/2020 09:25   DG Chest Portable 1 View  Result Date: 10/24/2020 CLINICAL DATA:  Syncopal episode.  Bradycardia. EXAM: PORTABLE CHEST 1 VIEW COMPARISON:  Radiographs 02/24/2018 and 01/29/2018. FINDINGS: 1202 hours. The heart size and mediastinal contours are stable. The heart size is stable at the upper limits of normal, and there is aortic atherosclerosis. The diffuse interstitial prominence seen previously has improved with mild subpleural reticulation similar to baseline radiographs. No confluent airspace opacity, pleural effusion or pneumothorax. There are surgical clips in the right axilla post right mastectomy. No acute osseous findings. Telemetry leads overlie the chest. IMPRESSION: Cardiomegaly with mild chronic interstitial lung disease. No acute cardiopulmonary process. Electronically Signed   By: Richardean Sale M.D.   On: 10/24/2020 12:14    Wt Readings from Last 3 Encounters:  10/24/20 81.6 kg  10/23/19 85.7 kg  01/28/19 86.5 kg    EKG: Probable A-V dissociation.  Ventricular rate in the mid to upper 30s.  Physical Exam: Chronically ill-appearing female Blood pressure (!) 146/36, pulse (!) 44, temperature 98.2 F (36.8 C), temperature source Oral, resp. rate 19, height 4' 9"  (1.448 m), weight 81.6 kg, SpO2 99 %. Body mass index is 38.95 kg/m. General: Well developed, well nourished, in no acute distress. Head: Normocephalic, atraumatic, sclera non-icteric, no  xanthomas, nares are without discharge.  Neck: Negative for carotid bruits. JVD not elevated. Lungs: Clear bilaterally to auscultation without wheezes, rales, or  rhonchi. Breathing is unlabored. Heart: RRR with S1 S2. No murmurs, rubs, or gallops appreciated. Abdomen: Soft, non-tender, non-distended with normoactive bowel sounds. No hepatomegaly. No rebound/guarding. No obvious abdominal masses. Msk:  Strength and tone appear normal for age. Extremities: No clubbing or cyanosis. No edema.  Distal pedal pulses are 2+ and equal bilaterally. Neuro: Alert and oriented X 3. No facial asymmetry. No focal deficit. Moves all extremities spontaneously. Psych:  Responds to questions appropriately with a normal affect.     Assessment and Plan  80 year old female with history of chronic kidney disease hypertension who presented to emergency room with several days of poor p.o. intake, diarrhea and syncope when standing up.  Noted to have A-V dissociation on electrocardiogram with slow ventricular response.  Also had acute on chronic renal insufficiency with a creatinine of 3.79 up from approximately 1-1/2-2.  She is improved with IV fluids.  1.  A-V dissociation-likely secondary to acute renal insufficiency.  She is mildly hypokalemic.  Also also volume depleted.  Likely multifactorial etiology of her syncope.  We will follow heart rate as she is being rehydrated.  Would defer any AV nodal related meds.  After hydration if she remains in high-grade heart block, consideration for backup pacing could be discussed.  Would limit any anticoagulations chronically.  Determination regarding possible pacing in the morning.  Would keep n.p.o. overnight in case this is needed.  Echocardiogram done on last week revealed normal LV function with mild to moderately enlarged right and left atrium, and moderately elevated right-sided pressures with an estimated RV systolic pressure of 48 mmHg.  2.  Acute renal insufficiency-likely secondary to diarrhea and severe p.o. intake.  Agree with gentle hydration following electrolytes, renal function and hemodynamics.   Signed, Teodoro Spray  MD 10/24/2020, 4:45 PM Pager: [phone]

## 2020-10-25 ENCOUNTER — Inpatient Hospital Stay: Payer: Medicare HMO

## 2020-10-25 DIAGNOSIS — Y9301 Activity, walking, marching and hiking: Secondary | ICD-10-CM | POA: Diagnosis present

## 2020-10-25 DIAGNOSIS — E860 Dehydration: Secondary | ICD-10-CM | POA: Diagnosis present

## 2020-10-25 DIAGNOSIS — A0472 Enterocolitis due to Clostridium difficile, not specified as recurrent: Secondary | ICD-10-CM | POA: Diagnosis not present

## 2020-10-25 DIAGNOSIS — E785 Hyperlipidemia, unspecified: Secondary | ICD-10-CM | POA: Diagnosis present

## 2020-10-25 DIAGNOSIS — S9001XA Contusion of right ankle, initial encounter: Secondary | ICD-10-CM | POA: Diagnosis not present

## 2020-10-25 DIAGNOSIS — K7460 Unspecified cirrhosis of liver: Secondary | ICD-10-CM | POA: Diagnosis present

## 2020-10-25 DIAGNOSIS — N1832 Chronic kidney disease, stage 3b: Secondary | ICD-10-CM | POA: Diagnosis present

## 2020-10-25 DIAGNOSIS — R0902 Hypoxemia: Secondary | ICD-10-CM | POA: Diagnosis present

## 2020-10-25 DIAGNOSIS — N2889 Other specified disorders of kidney and ureter: Secondary | ICD-10-CM | POA: Diagnosis present

## 2020-10-25 DIAGNOSIS — K7469 Other cirrhosis of liver: Secondary | ICD-10-CM | POA: Diagnosis not present

## 2020-10-25 DIAGNOSIS — R001 Bradycardia, unspecified: Secondary | ICD-10-CM | POA: Diagnosis present

## 2020-10-25 DIAGNOSIS — E872 Acidosis: Secondary | ICD-10-CM | POA: Diagnosis present

## 2020-10-25 DIAGNOSIS — I129 Hypertensive chronic kidney disease with stage 1 through stage 4 chronic kidney disease, or unspecified chronic kidney disease: Secondary | ICD-10-CM | POA: Diagnosis present

## 2020-10-25 DIAGNOSIS — I4430 Unspecified atrioventricular block: Secondary | ICD-10-CM

## 2020-10-25 DIAGNOSIS — Y92009 Unspecified place in unspecified non-institutional (private) residence as the place of occurrence of the external cause: Secondary | ICD-10-CM | POA: Diagnosis not present

## 2020-10-25 DIAGNOSIS — N179 Acute kidney failure, unspecified: Secondary | ICD-10-CM | POA: Diagnosis not present

## 2020-10-25 DIAGNOSIS — R161 Splenomegaly, not elsewhere classified: Secondary | ICD-10-CM | POA: Diagnosis not present

## 2020-10-25 DIAGNOSIS — E875 Hyperkalemia: Secondary | ICD-10-CM | POA: Diagnosis not present

## 2020-10-25 DIAGNOSIS — I272 Pulmonary hypertension, unspecified: Secondary | ICD-10-CM | POA: Diagnosis present

## 2020-10-25 DIAGNOSIS — Z20822 Contact with and (suspected) exposure to covid-19: Secondary | ICD-10-CM | POA: Diagnosis present

## 2020-10-25 DIAGNOSIS — N170 Acute kidney failure with tubular necrosis: Secondary | ICD-10-CM | POA: Diagnosis present

## 2020-10-25 DIAGNOSIS — I5031 Acute diastolic (congestive) heart failure: Secondary | ICD-10-CM | POA: Diagnosis not present

## 2020-10-25 DIAGNOSIS — R1310 Dysphagia, unspecified: Secondary | ICD-10-CM | POA: Diagnosis present

## 2020-10-25 DIAGNOSIS — I4589 Other specified conduction disorders: Secondary | ICD-10-CM | POA: Diagnosis present

## 2020-10-25 DIAGNOSIS — S82831A Other fracture of upper and lower end of right fibula, initial encounter for closed fracture: Secondary | ICD-10-CM | POA: Diagnosis not present

## 2020-10-25 DIAGNOSIS — D696 Thrombocytopenia, unspecified: Secondary | ICD-10-CM | POA: Diagnosis present

## 2020-10-25 DIAGNOSIS — I081 Rheumatic disorders of both mitral and tricuspid valves: Secondary | ICD-10-CM | POA: Diagnosis present

## 2020-10-25 DIAGNOSIS — R55 Syncope and collapse: Secondary | ICD-10-CM | POA: Diagnosis not present

## 2020-10-25 DIAGNOSIS — E1122 Type 2 diabetes mellitus with diabetic chronic kidney disease: Secondary | ICD-10-CM | POA: Diagnosis present

## 2020-10-25 DIAGNOSIS — Z9861 Coronary angioplasty status: Secondary | ICD-10-CM | POA: Diagnosis not present

## 2020-10-25 DIAGNOSIS — I10 Essential (primary) hypertension: Secondary | ICD-10-CM | POA: Diagnosis not present

## 2020-10-25 DIAGNOSIS — B1910 Unspecified viral hepatitis B without hepatic coma: Secondary | ICD-10-CM | POA: Diagnosis present

## 2020-10-25 DIAGNOSIS — W1839XA Other fall on same level, initial encounter: Secondary | ICD-10-CM | POA: Diagnosis present

## 2020-10-25 DIAGNOSIS — I700 Atherosclerosis of aorta: Secondary | ICD-10-CM | POA: Diagnosis not present

## 2020-10-25 DIAGNOSIS — I442 Atrioventricular block, complete: Secondary | ICD-10-CM | POA: Diagnosis present

## 2020-10-25 DIAGNOSIS — I509 Heart failure, unspecified: Secondary | ICD-10-CM | POA: Diagnosis not present

## 2020-10-25 DIAGNOSIS — D631 Anemia in chronic kidney disease: Secondary | ICD-10-CM | POA: Diagnosis not present

## 2020-10-25 DIAGNOSIS — N2581 Secondary hyperparathyroidism of renal origin: Secondary | ICD-10-CM | POA: Diagnosis present

## 2020-10-25 DIAGNOSIS — R809 Proteinuria, unspecified: Secondary | ICD-10-CM | POA: Diagnosis not present

## 2020-10-25 LAB — IRON AND TIBC
Iron: 171 ug/dL — ABNORMAL HIGH (ref 28–170)
Saturation Ratios: 41 % — ABNORMAL HIGH (ref 10.4–31.8)
TIBC: 417 ug/dL (ref 250–450)
UIBC: 246 ug/dL

## 2020-10-25 LAB — URINALYSIS, COMPLETE (UACMP) WITH MICROSCOPIC
Bacteria, UA: NONE SEEN
Bilirubin Urine: NEGATIVE
Glucose, UA: NEGATIVE mg/dL
Hgb urine dipstick: NEGATIVE
Ketones, ur: 5 mg/dL — AB
Leukocytes,Ua: NEGATIVE
Nitrite: NEGATIVE
Protein, ur: 100 mg/dL — AB
Specific Gravity, Urine: 1.015 (ref 1.005–1.030)
pH: 5 (ref 5.0–8.0)

## 2020-10-25 LAB — BASIC METABOLIC PANEL
Anion gap: 12 (ref 5–15)
BUN: 53 mg/dL — ABNORMAL HIGH (ref 8–23)
CO2: 20 mmol/L — ABNORMAL LOW (ref 22–32)
Calcium: 8.5 mg/dL — ABNORMAL LOW (ref 8.9–10.3)
Chloride: 105 mmol/L (ref 98–111)
Creatinine, Ser: 3.24 mg/dL — ABNORMAL HIGH (ref 0.44–1.00)
GFR, Estimated: 14 mL/min — ABNORMAL LOW (ref >60)
Glucose, Bld: 135 mg/dL — ABNORMAL HIGH (ref 70–99)
Potassium: 4.5 mmol/L (ref 3.5–5.1)
Sodium: 137 mmol/L (ref 135–145)

## 2020-10-25 LAB — GLUCOSE, CAPILLARY
Glucose-Capillary: 110 mg/dL — ABNORMAL HIGH (ref 70–99)
Glucose-Capillary: 124 mg/dL — ABNORMAL HIGH (ref 70–99)
Glucose-Capillary: 143 mg/dL — ABNORMAL HIGH (ref 70–99)
Glucose-Capillary: 174 mg/dL — ABNORMAL HIGH (ref 70–99)

## 2020-10-25 LAB — CBC
HCT: 25.2 % — ABNORMAL LOW (ref 36.0–46.0)
Hemoglobin: 7.9 g/dL — ABNORMAL LOW (ref 12.0–15.0)
MCH: 31.6 pg (ref 26.0–34.0)
MCHC: 31.3 g/dL (ref 30.0–36.0)
MCV: 100.8 fL — ABNORMAL HIGH (ref 80.0–100.0)
Platelets: 204 10*3/uL (ref 150–400)
RBC: 2.5 MIL/uL — ABNORMAL LOW (ref 3.87–5.11)
RDW: 17.2 % — ABNORMAL HIGH (ref 11.5–15.5)
WBC: 9.1 10*3/uL (ref 4.0–10.5)
nRBC: 0 % (ref 0.0–0.2)

## 2020-10-25 LAB — HEMOGLOBIN A1C
Hgb A1c MFr Bld: 5.3 % (ref 4.8–5.6)
Mean Plasma Glucose: 105.41 mg/dL

## 2020-10-25 LAB — CBG MONITORING, ED: Glucose-Capillary: 138 mg/dL — ABNORMAL HIGH (ref 70–99)

## 2020-10-25 LAB — CK: Total CK: 472 U/L — ABNORMAL HIGH (ref 38–234)

## 2020-10-25 LAB — VITAMIN B12: Vitamin B-12: 2884 pg/mL — ABNORMAL HIGH (ref 180–914)

## 2020-10-25 MED ORDER — VANCOMYCIN 50 MG/ML ORAL SOLUTION
125.0000 mg | Freq: Four times a day (QID) | ORAL | Status: DC
  Administered 2020-10-25 – 2020-10-29 (×15): 125 mg via ORAL
  Filled 2020-10-25 (×19): qty 2.5

## 2020-10-25 MED ORDER — SODIUM BICARBONATE 650 MG PO TABS
650.0000 mg | ORAL_TABLET | Freq: Three times a day (TID) | ORAL | Status: DC
  Administered 2020-10-25 – 2020-10-29 (×11): 650 mg via ORAL
  Filled 2020-10-25 (×12): qty 1

## 2020-10-25 MED ORDER — METRONIDAZOLE IN NACL 5-0.79 MG/ML-% IV SOLN
500.0000 mg | Freq: Three times a day (TID) | INTRAVENOUS | Status: DC
  Administered 2020-10-25 – 2020-10-28 (×7): 500 mg via INTRAVENOUS
  Filled 2020-10-25 (×10): qty 100

## 2020-10-25 NOTE — Progress Notes (Signed)
PROGRESS NOTE    Emily Faulkner  SWN:[id number] DOB: Jan 13, 1941 DOA: 10/24/2020 PCP: Sofie Hartigan, MD   Chief complaint.  Syncope. Brief Narrative:  Emily Faulkner is a 80 y.o. female with medical history significant for diabetes mellitus with complications of stage III chronic kidney disease, hypertension, pulmonary hypertension and dyslipidemia who presents to the emergency room by EMS for evaluation of a syncopal episode. Patient also has been having poor appetite, nausea vomiting and frequent diarrhea for the last few days.  Upon arriving the hospital, C. difficile toxin was positive.  Was also found to have third-degree AV block while in the ER, improved after giving fluids.    Assessment & Plan:   Principal Problem:   Syncope and collapse Active Problems:   Hypertension   AKI (acute kidney injury) ([place])   Complete heart block (HCC)   Hyperkalemia   Odynophagia  #1.  Syncope and collapse. Transient third-degree AV block. Reviewed the patient telemetry today, heart rate 37, consistent with 2:1 conduction vs sinus bradycardia. Discussed with Dr. Ubaldo Glassing, planning for cardiac maker tomorrow. We will monitor closely tonight.  #2.  Hypoxia. Patient has been requiring 2 L oxygen, she has a crackle in the lungs, she has a mild volume overload.  I will discontinue fluids for now.  #3.  Acute kidney injury on chronic kidney disease stage IIIb secondary to dehydration. Anion gap metabolic acidosis. Hyperkalemia C. difficile colitis. Reviewed previous lab, baseline creatinine 1.26 with GFR 40. Patient received a bicarb drip overnight, CO2 level came up to 20.  Currently, patient developed hypoxemia, with small amount of crackles in the base.  IV fluids discontinued.  Continue oral sodium bicarbonate. Patient also has positive C. difficile antigen, toxin was negative, however, patient has profound diarrhea with leukocytosis, consistent with C. difficile colitis. Will start oral  vancomycin.  Patient states that this is her first episode of C. Difficile. Potassium normalized.  #4.  Type 2 diabetes. Continue sliding scale insulin.  #5.  Odynophagia. Appear to be secondary to Fosamax, continue PPI.  6.  Anemia. Check  B12 level, transfuse as needed, iron level slightly higher than normal..  Patient does not have any active bleeding.       DVT prophylaxis: Heparin Code Status: Full Family Communication: son updated Disposition Plan:  .   Status is: Observation  The patient will require care spanning > 2 midnights and should be moved to inpatient because: Inpatient level of care appropriate due to severity of illness  Dispo: The patient is from: Home              Anticipated d/c is to: Home              Patient currently is not medically stable to d/c.   Difficult to place patient No        No intake/output data recorded. No intake/output data recorded.     Consultants:   cardiology  Procedures: none  Antimicrobials: vancomycin  Subjective: Patient complaining some short of breath, currently on 2 L oxygen, no cough Still has significant diarrhea, nausea vomiting seem to better, she still has a poor appetite, still have multiple loose stools a day. No fever chills. No dysuria hematuria  No headache or dizziness. No chest pain or palpitation.  Objective: Vitals:   10/25/20 0630 10/25/20 0855 10/25/20 1203 10/25/20 1218  BP: (!) 129/44 (!) 140/34 (!) 141/33 (!) 141/33  Pulse: (!) 42 80 (!) 38   Resp: (!) 23 18  18 18  Temp:  98.5 F (36.9 C) 98.4 F (36.9 C) 98.4 F (36.9 C)  TempSrc:  Oral Oral Oral  SpO2: 97% 100% 99% 99%  Weight:      Height:       No intake or output data in the 24 hours ending 10/25/20 1413 Filed Weights   10/24/20 1114  Weight: 81.6 kg    Examination:  General exam: Appears calm and comfortable  Respiratory system: Crackles in the base bilaterally. Respiratory effort normal. Cardiovascular  system: Bradycardic and regular. No JVD, murmurs, rubs, gallops or clicks. No pedal edema. Gastrointestinal system: Abdomen is nondistended, soft and nontender. No organomegaly or masses felt. Normal bowel sounds heard. Central nervous system: Alert and oriented. No focal neurological deficits. Extremities: Symmetric  Skin: No rashes, lesions or ulcers Psychiatry:  Mood & affect appropriate.     Data Reviewed: I have personally reviewed following labs and imaging studies  CBC: Recent Labs  Lab 10/24/20 1130 10/25/20 0518  WBC 9.3 9.1  NEUTROABS 7.3  --   HGB 8.8* 7.9*  HCT 29.3* 25.2*  MCV 105.4* 100.8*  PLT 205 751   Basic Metabolic Panel: Recent Labs  Lab 10/24/20 1130 10/25/20 0518  NA 136 137  K 5.2* 4.5  CL 107 105  CO2 15* 20*  GLUCOSE 169* 135*  BUN 52* 53*  CREATININE 3.79* 3.24*  CALCIUM 9.1 8.5*  MG 2.8*  --    GFR: Estimated Creatinine Clearance: 12.4 mL/min (A) (by C-G formula based on SCr of 3.24 mg/dL (H)). Liver Function Tests: Recent Labs  Lab 10/24/20 1130  AST 30  ALT 13  ALKPHOS 55  BILITOT 1.0  PROT 6.9  ALBUMIN 3.6   No results for input(s): LIPASE, AMYLASE in the last 168 hours. No results for input(s): AMMONIA in the last 168 hours. Coagulation Profile: No results for input(s): INR, PROTIME in the last 168 hours. Cardiac Enzymes: Recent Labs  Lab 10/25/20 0518  CKTOTAL 472*   BNP (last 3 results) No results for input(s): PROBNP in the last 8760 hours. HbA1C: Recent Labs    10/25/20 0518  HGBA1C 5.3   CBG: Recent Labs  Lab 10/24/20 1905 10/25/20 0522 10/25/20 0859 10/25/20 1215  GLUCAP 129* 138* 124* 174*   Lipid Profile: No results for input(s): CHOL, HDL, LDLCALC, TRIG, CHOLHDL, LDLDIRECT in the last 72 hours. Thyroid Function Tests: No results for input(s): TSH, T4TOTAL, FREET4, T3FREE, THYROIDAB in the last 72 hours. Anemia Panel: Recent Labs    10/25/20 0518  TIBC 417  IRON 171*   Sepsis Labs: No  results for input(s): PROCALCITON, LATICACIDVEN in the last 168 hours.  Recent Results (from the past 240 hour(s))  Resp Panel by RT-PCR (Flu A&B, Covid) Nasopharyngeal Swab     Status: None   Collection Time: 10/24/20 11:24 AM   Specimen: Nasopharyngeal Swab; Nasopharyngeal(NP) swabs in vial transport medium  Result Value Ref Range Status   SARS Coronavirus 2 by RT PCR NEGATIVE NEGATIVE Final    Comment: (NOTE) SARS-CoV-2 target nucleic acids are NOT DETECTED.  The SARS-CoV-2 RNA is generally detectable in upper respiratory specimens during the acute phase of infection. The lowest concentration of SARS-CoV-2 viral copies this assay can detect is 138 copies/mL. A negative result does not preclude SARS-Cov-2 infection and should not be used as the sole basis for treatment or other patient management decisions. A negative result may occur with  improper specimen collection/handling, submission of specimen other than nasopharyngeal swab, presence of  viral mutation(s) within the areas targeted by this assay, and inadequate number of viral copies(<138 copies/mL). A negative result must be combined with clinical observations, patient history, and epidemiological information. The expected result is Negative.  Fact Sheet for Patients:  EntrepreneurPulse.com.au  Fact Sheet for Healthcare Providers:  IncredibleEmployment.be  This test is no t yet approved or cleared by the [place] FDA and  has been authorized for detection and/or diagnosis of SARS-CoV-2 by FDA under an Emergency Use Authorization (EUA). This EUA will remain  in effect (meaning this test can be used) for the duration of the COVID-19 declaration under Section 564(b)(1) of the Act, 21 U.S.C.section 360bbb-3(b)(1), unless the authorization is terminated  or revoked sooner.       Influenza A by PCR NEGATIVE NEGATIVE Final   Influenza B by PCR NEGATIVE NEGATIVE Final    Comment:  (NOTE) The Xpert Xpress SARS-CoV-2/FLU/RSV plus assay is intended as an aid in the diagnosis of influenza from Nasopharyngeal swab specimens and should not be used as a sole basis for treatment. Nasal washings and aspirates are unacceptable for Xpert Xpress SARS-CoV-2/FLU/RSV testing.  Fact Sheet for Patients: EntrepreneurPulse.com.au  Fact Sheet for Healthcare Providers: IncredibleEmployment.be  This test is not yet approved or cleared by the [place] FDA and has been authorized for detection and/or diagnosis of SARS-CoV-2 by FDA under an Emergency Use Authorization (EUA). This EUA will remain in effect (meaning this test can be used) for the duration of the COVID-19 declaration under Section 564(b)(1) of the Act, 21 U.S.C. section 360bbb-3(b)(1), unless the authorization is terminated or revoked.  Performed at [facility], [place], [place] [address]   C Difficile Quick Screen w PCR reflex     Status: Abnormal   Collection Time: 10/24/20  4:45 PM   Specimen: STOOL  Result Value Ref Range Status   C Diff antigen POSITIVE (A) NEGATIVE Final   C Diff toxin NEGATIVE NEGATIVE Final   C Diff interpretation Results are indeterminate. See PCR results.  Final    Comment: Performed at [facility], [address] [place], [place] [address]  C. Diff by PCR, Reflexed     Status: Abnormal   Collection Time: 10/24/20  4:45 PM  Result Value Ref Range Status   Toxigenic C. Difficile by PCR POSITIVE (A) NEGATIVE Final    Comment: Positive for toxigenic C. difficile with little to no toxin production. Only treat if clinical presentation suggests symptomatic illness. Performed at [facility], [address], [place], [place] [address]          Radiology Studies: CT Head Wo Contrast  Result Date: 10/24/2020 CLINICAL DATA:  Mental status change. Unknown cause of syncopal episode on aspirin. EXAM: CT  HEAD WITHOUT CONTRAST TECHNIQUE: Contiguous axial images were obtained from the base of the skull through the vertex without intravenous contrast. COMPARISON:  CT head 02/23/2018. FINDINGS: Brain: There is no evidence of acute intracranial hemorrhage, mass lesion, brain edema or extra-axial fluid collection. Stable mild atrophy with mild prominence of the ventricles and subarachnoid spaces. There are stable chronic small vessel ischemic changes in the periventricular white matter and a stable chronic posterior right parietal infarct. There is no CT evidence of acute cortical infarction. Vascular: Intracranial vascular calcifications. No hyperdense vessel identified. Skull: Negative for fracture or focal lesion. Sinuses/Orbits: The visualized paranasal sinuses and mastoid air cells are clear. No orbital abnormalities are seen. Other: Previous bilateral lens surgery. IMPRESSION: 1. Stable head CT without  evidence of acute intracranial process. 2. Stable atrophy and chronic small vessel ischemic changes as described. Electronically Signed   By: Richardean Sale M.D.   On: 10/24/2020 12:10   DG Chest Portable 1 View  Result Date: 10/24/2020 CLINICAL DATA:  Syncopal episode.  Bradycardia. EXAM: PORTABLE CHEST 1 VIEW COMPARISON:  Radiographs 02/24/2018 and 01/29/2018. FINDINGS: 1202 hours. The heart size and mediastinal contours are stable. The heart size is stable at the upper limits of normal, and there is aortic atherosclerosis. The diffuse interstitial prominence seen previously has improved with mild subpleural reticulation similar to baseline radiographs. No confluent airspace opacity, pleural effusion or pneumothorax. There are surgical clips in the right axilla post right mastectomy. No acute osseous findings. Telemetry leads overlie the chest. IMPRESSION: Cardiomegaly with mild chronic interstitial lung disease. No acute cardiopulmonary process. Electronically Signed   By: Richardean Sale M.D.   On: 10/24/2020  12:14        Scheduled Meds: . aspirin EC  81 mg Oral Daily  . cholecalciferol  2,000 Units Oral Daily  . feeding supplement  237 mL Oral BID BM  . Ferrous Fumarate  1 tablet Oral BID  . heparin  5,000 Units Subcutaneous Q8H  . hydrOXYzine  10 mg Oral QHS  . insulin aspart  0-15 Units Subcutaneous TID WC  . latanoprost  1 drop Left Eye QHS  . loratadine  10 mg Oral Daily  . magnesium oxide  800 mg Oral Daily  . niacin  500 mg Oral QHS  . omega-3 acid ethyl esters  1 g Oral Daily  . pantoprazole  40 mg Oral Daily  . simvastatin  20 mg Oral QHS  . sodium chloride flush  3 mL Intravenous Q12H  . vancomycin  125 mg Oral QID  . vitamin B-12  1,000 mcg Oral Daily   Continuous Infusions:   LOS: 0 days    Time spent: 35 minutes    Sharen Hones, MD [facility]   To contact the attending provider between 7A-7P or the covering provider during after hours 7P-7A, please log into the web site [email or web address] and access using universal [place] password for that web site. If you do not have the password, please call the hospital operator.  10/25/2020, 2:13 PM

## 2020-10-25 NOTE — Progress Notes (Addendum)
Patient Name: Emily Faulkner Date of Encounter: 10/25/2020  Hospital Problem List     Principal Problem:   Syncope and collapse Active Problems:   Hypertension   AKI (acute kidney injury) ([place])   Complete heart block (HCC)   Hyperkalemia   Odynophagia    Patient Profile     80 y.o. female with history of diabetes with stage III chronic kidney disease, hypertension, pulmonary hypertension and dyslipidemia who presented to the emergency room after a syncopal episode.  She has not felt well over the last couple of days and has had very poor fluid and oral intake.  She also has had diarrhea and when getting up to go the bathroom had a syncopal episode after dizziness.  In the emergency room she had hyperkalemia with potassium 5.2, BUN of 52 a creatinine of 3.79 from a baseline of approximately 1.3, magnesium of 2.8 platelet count of 205,000 with an hemoglobin of 8.8.  Twelve-lead EKG suggested A-V dissociation with ventricular escape in the mid 30s.  She is on no AV nodal medications.  Her heart rate improved somewhat with hydration.  At the time of my exam she had a ventricular rate between 50 and 60.  She was hemodynamically stable.  Subjective   States she does not feel good.  Denies dizziness.  Blood pressure 141/33.  Pulse 40-60.  Inpatient Medications    . aspirin EC  81 mg Oral Daily  . cholecalciferol  2,000 Units Oral Daily  . feeding supplement  237 mL Oral BID BM  . Ferrous Fumarate  1 tablet Oral BID  . heparin  5,000 Units Subcutaneous Q8H  . hydrOXYzine  10 mg Oral QHS  . insulin aspart  0-15 Units Subcutaneous TID WC  . latanoprost  1 drop Left Eye QHS  . loratadine  10 mg Oral Daily  . magnesium oxide  800 mg Oral Daily  . niacin  500 mg Oral QHS  . omega-3 acid ethyl esters  1 g Oral Daily  . pantoprazole  40 mg Oral Daily  . simvastatin  20 mg Oral QHS  . sodium chloride flush  3 mL Intravenous Q12H  . vitamin B-12  1,000 mcg Oral Daily    Vital Signs     Vitals:   10/25/20 0600 10/25/20 0630 10/25/20 0855 10/25/20 1203  BP: (!) 118/31 (!) 129/44 (!) 140/34 (!) 141/33  Pulse: (!) 42 (!) 42 80 (!) 38  Resp: 18 (!) 23 18 18   Temp:   98.5 F (36.9 C) 98.4 F (36.9 C)  TempSrc:   Oral Oral  SpO2: 93% 97% 100% 99%  Weight:      Height:       No intake or output data in the 24 hours ending 10/25/20 1215 Filed Weights   10/24/20 1114  Weight: 81.6 kg    Physical Exam    GEN: Chronically ill-appearing HEENT: normal.  Neck: Supple, no JVD, carotid bruits, or masses. Cardiac: Irregular and bradycardic Respiratory:  Respirations regular and unlabored, clear to auscultation bilaterally. GI: Soft, nontender, nondistended, BS + x 4. MS: no deformity or atrophy. Skin: warm and dry, no rash. Neuro:  Strength and sensation are intact. Psych: Normal affect.  Labs    CBC Recent Labs    10/24/20 1130 10/25/20 0518  WBC 9.3 9.1  NEUTROABS 7.3  --   HGB 8.8* 7.9*  HCT 29.3* 25.2*  MCV 105.4* 100.8*  PLT 205 962   Basic Metabolic Panel Recent Labs  10/24/20 1130 10/25/20 0518  NA 136 137  K 5.2* 4.5  CL 107 105  CO2 15* 20*  GLUCOSE 169* 135*  BUN 52* 53*  CREATININE 3.79* 3.24*  CALCIUM 9.1 8.5*  MG 2.8*  --    Liver Function Tests Recent Labs    10/24/20 1130  AST 30  ALT 13  ALKPHOS 55  BILITOT 1.0  PROT 6.9  ALBUMIN 3.6   No results for input(s): LIPASE, AMYLASE in the last 72 hours. Cardiac Enzymes Recent Labs    10/25/20 0518  CKTOTAL 472*   BNP No results for input(s): BNP in the last 72 hours. D-Dimer No results for input(s): DDIMER in the last 72 hours. Hemoglobin A1C Recent Labs    10/25/20 0518  HGBA1C 5.3   Fasting Lipid Panel No results for input(s): CHOL, HDL, LDLCALC, TRIG, CHOLHDL, LDLDIRECT in the last 72 hours. Thyroid Function Tests No results for input(s): TSH, T4TOTAL, T3FREE, THYROIDAB in the last 72 hours.  Invalid input(s): FREET3  Telemetry    Sinus rate appears to  be in the 40-50 bpm range with variable conduction  ECG    Third-degree heart block with ventricular escape in the mid to high 30s  Radiology    CT Head Wo Contrast  Result Date: 10/24/2020 CLINICAL DATA:  Mental status change. Unknown cause of syncopal episode on aspirin. EXAM: CT HEAD WITHOUT CONTRAST TECHNIQUE: Contiguous axial images were obtained from the base of the skull through the vertex without intravenous contrast. COMPARISON:  CT head 02/23/2018. FINDINGS: Brain: There is no evidence of acute intracranial hemorrhage, mass lesion, brain edema or extra-axial fluid collection. Stable mild atrophy with mild prominence of the ventricles and subarachnoid spaces. There are stable chronic small vessel ischemic changes in the periventricular white matter and a stable chronic posterior right parietal infarct. There is no CT evidence of acute cortical infarction. Vascular: Intracranial vascular calcifications. No hyperdense vessel identified. Skull: Negative for fracture or focal lesion. Sinuses/Orbits: The visualized paranasal sinuses and mastoid air cells are clear. No orbital abnormalities are seen. Other: Previous bilateral lens surgery. IMPRESSION: 1. Stable head CT without evidence of acute intracranial process. 2. Stable atrophy and chronic small vessel ischemic changes as described. Electronically Signed   By: Richardean Sale M.D.   On: 10/24/2020 12:10   US RENAL  Result Date: 10/17/2020 CLINICAL DATA:  Surveillance of left renal mass. EXAM: RENAL / URINARY TRACT ULTRASOUND COMPLETE COMPARISON:  Prior yearly ultrasound studies dating back 2019. FINDINGS: Right Kidney: Renal measurements: 9.3 x 3.6 x 4.1 cm = volume: 73 mL. Stable mild atrophy. No hydronephrosis. No right-sided renal lesions identified by ultrasound. Left Kidney: Renal measurements: 9.7 x 4.4 x 5.3 cm = volume: 118 mL. Interpolar mass centered at the level of the renal cortex currently measures approximately 2.9 x 2.8 x 2.5 cm  and shows gradual enlargement since initial detection. Bladder: Appears normal for degree of bladder distention. Other: None. IMPRESSION: Enlargement of interpolar left renal mass by ultrasound over time with maximal diameter currently estimated to be approximately 2.9 cm. Further characterization and more accurate measurements may be possible by CT or MRI. Electronically Signed   By: Aletta Edouard M.D.   On: 10/17/2020 09:25   DG Chest Portable 1 View  Result Date: 10/24/2020 CLINICAL DATA:  Syncopal episode.  Bradycardia. EXAM: PORTABLE CHEST 1 VIEW COMPARISON:  Radiographs 02/24/2018 and 01/29/2018. FINDINGS: 1202 hours. The heart size and mediastinal contours are stable. The heart size is stable at the  upper limits of normal, and there is aortic atherosclerosis. The diffuse interstitial prominence seen previously has improved with mild subpleural reticulation similar to baseline radiographs. No confluent airspace opacity, pleural effusion or pneumothorax. There are surgical clips in the right axilla post right mastectomy. No acute osseous findings. Telemetry leads overlie the chest. IMPRESSION: Cardiomegaly with mild chronic interstitial lung disease. No acute cardiopulmonary process. Electronically Signed   By: Richardean Sale M.D.   On: 10/24/2020 12:14    Assessment & Plan    79 year old female with history of chronic kidney disease hypertension who presented to emergency room with several days of poor p.o. intake, diarrhea and syncope when standing up.  Noted to have A-V dissociation on electrocardiogram with slow ventricular response.  Also had acute on chronic renal insufficiency with a creatinine of 3.79 up from approximately 1-1/2-2.  She is improved with IV fluids.  1.  A-V dissociation-likely secondary to acute renal insufficiency at least in part.  Renal function has improved slightly but still severely decreased.  Potassium is improved.  No prolonged pauses.  Hemodynamically stable.   Remains bradycardic with intermittent AV conduction.   We will follow heart rate as she is being rehydrated.  Would defer any AV nodal related meds.She is c diff positive which is makes placing pacemaker somewhat high risk for infection. Is on abx for c diff. She is stable hemodynamically but slow ventricular rate is concerning. Likely secondary to renal insufficiency.    Echocardiogram done on last week revealed normal LV function with mild to moderately enlarged right and left atrium, and moderately elevated right-sided pressures with an estimated RV systolic pressure of 48 mmHg. Interdepartement disscussion underway. Woul hold fluid resussucitation and follow renal funciton.   2.  Acute renal insufficiency-likely secondary to diarrhea and severe p.o. intake.  Agree with gentle hydration following electrolytes, renal function and hemodynamics.   Signed, Javier Docker Shlok Raz MD 10/25/2020, 12:15 PM  Pager: [phone]

## 2020-10-25 NOTE — Progress Notes (Signed)
Pt report received from off going nurse Pt resting comfortably in bed She appears to be in no apparent distress Pt updated on plan of care Verbalizes an understanding.  Denies any additional wants or needs at this time Call bell within reach Will continue to monitor.

## 2020-10-26 ENCOUNTER — Other Ambulatory Visit (HOSPITAL_COMMUNITY): Payer: Self-pay

## 2020-10-26 ENCOUNTER — Encounter
Admission: EM | Disposition: A | Discharge status: Home Health | Payer: Self-pay | Source: Home / Self Care | Attending: Internal Medicine

## 2020-10-26 ENCOUNTER — Inpatient Hospital Stay (HOSPITAL_COMMUNITY)
Admit: 2020-10-26 | Discharge: 2020-10-26 | Disposition: A | Discharge status: Home / Self Care | Payer: Medicare HMO | Attending: Internal Medicine | Admitting: Internal Medicine

## 2020-10-26 ENCOUNTER — Inpatient Hospital Stay: Payer: Medicare HMO

## 2020-10-26 DIAGNOSIS — R55 Syncope and collapse: Secondary | ICD-10-CM

## 2020-10-26 HISTORY — PX: PACEMAKER IMPLANT: EP1218

## 2020-10-26 LAB — GLUCOSE, CAPILLARY
Glucose-Capillary: 128 mg/dL — ABNORMAL HIGH (ref 70–99)
Glucose-Capillary: 121 mg/dL — ABNORMAL HIGH (ref 70–99)
Glucose-Capillary: 138 mg/dL — ABNORMAL HIGH (ref 70–99)
Glucose-Capillary: 125 mg/dL — ABNORMAL HIGH (ref 70–99)
Glucose-Capillary: 137 mg/dL — ABNORMAL HIGH (ref 70–99)
Glucose-Capillary: 203 mg/dL — ABNORMAL HIGH (ref 70–99)

## 2020-10-26 LAB — CBC WITH DIFFERENTIAL/PLATELET
Abs Immature Granulocytes: 0.03 10*3/uL (ref 0.00–0.07)
Basophils Absolute: 0.1 10*3/uL (ref 0.0–0.1)
Basophils Relative: 1 %
Eosinophils Absolute: 0.3 10*3/uL (ref 0.0–0.5)
Eosinophils Relative: 4 %
HCT: 26.2 % — ABNORMAL LOW (ref 36.0–46.0)
Hemoglobin: 8 g/dL — ABNORMAL LOW (ref 12.0–15.0)
Immature Granulocytes: 0 %
Lymphocytes Relative: 20 %
Lymphs Abs: 1.4 10*3/uL (ref 0.7–4.0)
MCH: 31.3 pg (ref 26.0–34.0)
MCHC: 30.5 g/dL (ref 30.0–36.0)
MCV: 102.3 fL — ABNORMAL HIGH (ref 80.0–100.0)
Monocytes Absolute: 0.8 10*3/uL (ref 0.1–1.0)
Monocytes Relative: 11 %
Neutro Abs: 4.7 10*3/uL (ref 1.7–7.7)
Neutrophils Relative %: 64 %
Platelets: 161 10*3/uL (ref 150–400)
RBC: 2.56 MIL/uL — ABNORMAL LOW (ref 3.87–5.11)
RDW: 17.3 % — ABNORMAL HIGH (ref 11.5–15.5)
WBC: 7.3 10*3/uL (ref 4.0–10.5)
nRBC: 0 % (ref 0.0–0.2)

## 2020-10-26 LAB — ECHOCARDIOGRAM COMPLETE
Height: 57 in
Weight: 3110.4 oz

## 2020-10-26 LAB — BASIC METABOLIC PANEL
Anion gap: 14 (ref 5–15)
BUN: 58 mg/dL — ABNORMAL HIGH (ref 8–23)
CO2: 20 mmol/L — ABNORMAL LOW (ref 22–32)
Calcium: 8.2 mg/dL — ABNORMAL LOW (ref 8.9–10.3)
Chloride: 105 mmol/L (ref 98–111)
Creatinine, Ser: 3.57 mg/dL — ABNORMAL HIGH (ref 0.44–1.00)
GFR, Estimated: 12 mL/min — ABNORMAL LOW (ref >60)
Glucose, Bld: 114 mg/dL — ABNORMAL HIGH (ref 70–99)
Potassium: 4.5 mmol/L (ref 3.5–5.1)
Sodium: 139 mmol/L (ref 135–145)

## 2020-10-26 LAB — MAGNESIUM: Magnesium: 2.7 mg/dL — ABNORMAL HIGH (ref 1.7–2.4)

## 2020-10-26 LAB — HEPATITIS C ANTIBODY: HCV Ab: NONREACTIVE

## 2020-10-26 LAB — HEPATITIS B CORE ANTIBODY, IGM: Hep B C IgM: NONREACTIVE

## 2020-10-26 LAB — SURGICAL PCR SCREEN
MRSA, PCR: NEGATIVE
Staphylococcus aureus: POSITIVE — AB

## 2020-10-26 LAB — PHOSPHORUS: Phosphorus: 4.8 mg/dL — ABNORMAL HIGH (ref 2.5–4.6)

## 2020-10-26 LAB — HEPATITIS B CORE ANTIBODY, TOTAL: Hep B Core Total Ab: REACTIVE — AB

## 2020-10-26 LAB — HEPATITIS B SURFACE ANTIBODY,QUALITATIVE: Hep B S Ab: REACTIVE — AB

## 2020-10-26 SURGERY — PACEMAKER IMPLANT
Anesthesia: Choice

## 2020-10-26 MED ORDER — HEPARIN (PORCINE) IN NACL 1000-0.9 UT/500ML-% IV SOLN
INTRAVENOUS | Status: DC | PRN
  Administered 2020-10-26: 500 mL

## 2020-10-26 MED ORDER — LIDOCAINE HCL (PF) 1 % IJ SOLN
INTRAMUSCULAR | Status: DC | PRN
  Administered 2020-10-26: 30 mL

## 2020-10-26 MED ORDER — FENTANYL CITRATE (PF) 100 MCG/2ML IJ SOLN
INTRAMUSCULAR | Status: DC | PRN
  Administered 2020-10-26 (×2): 25 ug via INTRAVENOUS

## 2020-10-26 MED ORDER — FENTANYL CITRATE (PF) 100 MCG/2ML IJ SOLN
INTRAMUSCULAR | Status: AC
  Filled 2020-10-26: qty 2

## 2020-10-26 MED ORDER — MIDAZOLAM HCL 2 MG/2ML IJ SOLN
INTRAMUSCULAR | Status: AC
  Filled 2020-10-26: qty 2

## 2020-10-26 MED ORDER — ONDANSETRON HCL 4 MG/2ML IJ SOLN: 4.0000 mg | Freq: Four times a day (QID) | INTRAMUSCULAR | Status: DC | PRN

## 2020-10-26 MED ORDER — SODIUM CHLORIDE 0.9 % IV SOLN
80.0000 mg | INTRAVENOUS | Status: AC
  Administered 2020-10-26: 80 mg
  Filled 2020-10-26: qty 80

## 2020-10-26 MED ORDER — VANCOMYCIN HCL 1000 MG/200ML IV SOLN
1000.0000 mg | INTRAVENOUS | Status: AC
  Administered 2020-10-26: 1000 mg via INTRAVENOUS
  Filled 2020-10-26 (×3): qty 200

## 2020-10-26 MED ORDER — MIDAZOLAM HCL 2 MG/2ML IJ SOLN
INTRAMUSCULAR | Status: DC | PRN
  Administered 2020-10-26: 1 mg via INTRAVENOUS

## 2020-10-26 MED ORDER — SODIUM CHLORIDE 0.9 % IV SOLN: INTRAVENOUS | Status: DC

## 2020-10-26 MED ORDER — VANCOMYCIN HCL IN DEXTROSE 1-5 GM/200ML-% IV SOLN
1000.0000 mg | Freq: Two times a day (BID) | INTRAVENOUS | Status: AC
  Administered 2020-10-26: 1000 mg via INTRAVENOUS
  Filled 2020-10-26: qty 200

## 2020-10-26 MED ORDER — HEPARIN (PORCINE) IN NACL 1000-0.9 UT/500ML-% IV SOLN
INTRAVENOUS | Status: AC
  Filled 2020-10-26: qty 1000

## 2020-10-26 MED ORDER — ACETAMINOPHEN 325 MG PO TABS
325.0000 mg | ORAL_TABLET | ORAL | Status: DC | PRN
Start: 2020-10-26 — End: 2020-10-29

## 2020-10-26 MED ORDER — IOHEXOL 9 MG/ML PO SOLN
500.0000 mL | ORAL | Status: AC
  Administered 2020-10-27: 500 mL via ORAL

## 2020-10-26 MED ORDER — CHLORHEXIDINE GLUCONATE CLOTH 2 % EX PADS
6.0000 | MEDICATED_PAD | Freq: Every day | CUTANEOUS | Status: DC
  Administered 2020-10-27 – 2020-10-29 (×3): 6 via TOPICAL

## 2020-10-26 MED ORDER — LIDOCAINE HCL (PF) 1 % IJ SOLN
INTRAMUSCULAR | Status: AC
  Filled 2020-10-26: qty 30

## 2020-10-26 SURGICAL SUPPLY — 23 items
CABLE SURG 12 DISP A/V CHANNEL (MISCELLANEOUS) ×2 IMPLANT
COVER SURGICAL LIGHT HANDLE (MISCELLANEOUS) ×2 IMPLANT
DERMABOND ADVANCED (GAUZE/BANDAGES/DRESSINGS) ×3
DERMABOND ADVANCED .7 DNX12 (GAUZE/BANDAGES/DRESSINGS) ×3 IMPLANT
DEVICE DSSCT PLSMBLD 3.0S LGHT (MISCELLANEOUS) ×1 IMPLANT
ELECT REM PT RETURN 9FT ADLT (ELECTROSURGICAL) ×2
ELECTRODE REM PT RTRN 9FT ADLT (ELECTROSURGICAL) ×1 IMPLANT
INTRO PACEMAKR LEAD 9FR 13CM (INTRODUCER) ×2
INTRO PACEMKR SHEATH II 7FR (MISCELLANEOUS) ×2
INTRODUCER PACEMKR LD 9FR 13CM (INTRODUCER) ×1 IMPLANT
INTRODUCER PACEMKR SHTH II 7FR (MISCELLANEOUS) ×1 IMPLANT
IPG PACE AZUR XT DR MRI W1DR01 (Pacemaker) ×1 IMPLANT
LEAD CAPSURE NOVUS 5076-52CM (Lead) ×2 IMPLANT
LEAD CAPSURE NOVUS 5076-58CM (Lead) ×2 IMPLANT
PACE AZURE XT DR MRI W1DR01 (Pacemaker) ×2 IMPLANT
PAD ELECT DEFIB RADIOL ZOLL (MISCELLANEOUS) ×2 IMPLANT
PLASMABLADE 3.0S W/LIGHT (MISCELLANEOUS) ×2
POUCH AIGIS-R ANTIBACT PPM (Mesh General) ×2 IMPLANT
SLING ARM IMMOBILIZER MED (SOFTGOODS) ×2 IMPLANT
SUT SILK 0 SH 30 (SUTURE) ×4 IMPLANT
SUT VIC AB 2-0 CT1 27 (SUTURE) ×1
SUT VIC AB 2-0 CT1 TAPERPNT 27 (SUTURE) ×1 IMPLANT
TRAY PACEMAKER INSERTION (PACKS) ×2 IMPLANT

## 2020-10-26 NOTE — Progress Notes (Signed)
[facility]  ROUNDING NOTE   Subjective:   [name] was admitted to [facility] on 10/24/2020 for Syncope and collapse [R55] Complete heart block ([place]) [I44.2] AKI (acute kidney injury) ([place]) [N17.9]  Patient had a syncopal episode. Patient states she has been having diarrhea and poor PO intake. Patient was found to have C. Diff colitis. She also complains of dysphagia and odynophagia. Thought to be secondary to bisphosphonate.   Patient found to have bradycardia with heart block. Pacemaker scheduled today.   For her AKI, she was placed on a bicarb infusion.   Continues to have diarrhea.   Objective:  Vital signs in last 24 hours:  Temp:  [97.6 F (36.4 C)-98.4 F (36.9 C)] 97.6 F (36.4 C) (04/20 0729) Pulse Rate:  [38-43] 43 (04/20 0729) Resp:  [17-18] 17 (04/20 0729) BP: (118-141)/(29-46) 122/46 (04/20 0729) SpO2:  [98 %-100 %] 100 % (04/20 0729) Weight:  [87.7 kg-88.2 kg] 88.2 kg (04/20 0729)  Weight change: 6.052 kg Filed Weights   10/24/20 1114 10/26/20 0500 10/26/20 0729  Weight: 81.6 kg 87.7 kg 88.2 kg    Intake/Output: I/O last 3 completed shifts: In: 2061.9 [I.V.:2061.9] Out: -    Intake/Output this shift:  No intake/output data recorded.  Physical Exam: General: NAD, laying in bed  Head: Normocephalic, atraumatic. Dry oral mucosal membranes  Eyes: Anicteric, PERRL  Neck: Supple, trachea midline  Lungs:  Clear to auscultation  Heart: Regular rate and rhythm  Abdomen:  Soft, nontender,   Extremities: no peripheral edema.  Neurologic: Nonfocal, moving all four extremities  Skin: No lesions  Access: none    Basic Metabolic Panel: Recent Labs  Lab 10/24/20 1130 10/25/20 0518 10/26/20 0508  NA 136 137 139  K 5.2* 4.5 4.5  CL 107 105 105  CO2 15* 20* 20*  GLUCOSE 169* 135* 114*  BUN 52* 53* 58*  CREATININE 3.79* 3.24* 3.57*  CALCIUM 9.1 8.5* 8.2*  MG 2.8*  --  2.7*  PHOS  --   --  4.8*    Liver Function Tests: Recent  Labs  Lab 10/24/20 1130  AST 30  ALT 13  ALKPHOS 55  BILITOT 1.0  PROT 6.9  ALBUMIN 3.6   No results for input(s): LIPASE, AMYLASE in the last 168 hours. No results for input(s): AMMONIA in the last 168 hours.  CBC: Recent Labs  Lab 10/24/20 1130 10/25/20 0518 10/26/20 0620  WBC 9.3 9.1 7.3  NEUTROABS 7.3  --  4.7  HGB 8.8* 7.9* 8.0*  HCT 29.3* 25.2* 26.2*  MCV 105.4* 100.8* 102.3*  PLT 205 204 161    Cardiac Enzymes: Recent Labs  Lab 10/25/20 0518  CKTOTAL 472*    BNP: Invalid input(s): POCBNP  CBG: Recent Labs  Lab 10/25/20 1215 10/25/20 1715 10/25/20 2334 10/26/20 0515 10/26/20 0727  GLUCAP 174* 143* 110* 128* 121*    Microbiology: Results for orders placed or performed during the hospital encounter of 10/24/20  Resp Panel by RT-PCR (Flu A&B, Covid) Nasopharyngeal Swab     Status: None   Collection Time: 10/24/20 11:24 AM   Specimen: Nasopharyngeal Swab; Nasopharyngeal(NP) swabs in vial transport medium  Result Value Ref Range Status   SARS Coronavirus 2 by RT PCR NEGATIVE NEGATIVE Final    Comment: (NOTE) SARS-CoV-2 target nucleic acids are NOT DETECTED.  The SARS-CoV-2 RNA is generally detectable in upper respiratory specimens during the acute phase of infection. The lowest concentration of SARS-CoV-2 viral copies this assay can detect is  138 copies/mL. A negative result does not preclude SARS-Cov-2 infection and should not be used as the sole basis for treatment or other patient management decisions. A negative result may occur with  improper specimen collection/handling, submission of specimen other than nasopharyngeal swab, presence of viral mutation(s) within the areas targeted by this assay, and inadequate number of viral copies(<138 copies/mL). A negative result must be combined with clinical observations, patient history, and epidemiological information. The expected result is Negative.  Fact Sheet for Patients:   EntrepreneurPulse.com.au  Fact Sheet for Healthcare Providers:  IncredibleEmployment.be  This test is no t yet approved or cleared by the [place] FDA and  has been authorized for detection and/or diagnosis of SARS-CoV-2 by FDA under an Emergency Use Authorization (EUA). This EUA will remain  in effect (meaning this test can be used) for the duration of the COVID-19 declaration under Section 564(b)(1) of the Act, 21 U.S.C.section 360bbb-3(b)(1), unless the authorization is terminated  or revoked sooner.       Influenza A by PCR NEGATIVE NEGATIVE Final   Influenza B by PCR NEGATIVE NEGATIVE Final    Comment: (NOTE) The Xpert Xpress SARS-CoV-2/FLU/RSV plus assay is intended as an aid in the diagnosis of influenza from Nasopharyngeal swab specimens and should not be used as a sole basis for treatment. Nasal washings and aspirates are unacceptable for Xpert Xpress SARS-CoV-2/FLU/RSV testing.  Fact Sheet for Patients: EntrepreneurPulse.com.au  Fact Sheet for Healthcare Providers: IncredibleEmployment.be  This test is not yet approved or cleared by the [place] FDA and has been authorized for detection and/or diagnosis of SARS-CoV-2 by FDA under an Emergency Use Authorization (EUA). This EUA will remain in effect (meaning this test can be used) for the duration of the COVID-19 declaration under Section 564(b)(1) of the Act, 21 U.S.C. section 360bbb-3(b)(1), unless the authorization is terminated or revoked.  Performed at [facility], [place], [place] [address]   C Difficile Quick Screen w PCR reflex     Status: Abnormal   Collection Time: 10/24/20  4:45 PM   Specimen: STOOL  Result Value Ref Range Status   C Diff antigen POSITIVE (A) NEGATIVE Final   C Diff toxin NEGATIVE NEGATIVE Final   C Diff interpretation Results are indeterminate. See PCR results.  Final     Comment: Performed at [facility], [address] [place], [place] [address]  C. Diff by PCR, Reflexed     Status: Abnormal   Collection Time: 10/24/20  4:45 PM  Result Value Ref Range Status   Toxigenic C. Difficile by PCR POSITIVE (A) NEGATIVE Final    Comment: Positive for toxigenic C. difficile with little to no toxin production. Only treat if clinical presentation suggests symptomatic illness. Performed at [facility], [address] [place], [place] [address]     Coagulation Studies: No results for input(s): LABPROT, INR in the last 72 hours.  Urinalysis: Recent Labs    10/25/20 0601  COLORURINE YELLOW*  LABSPEC 1.015  PHURINE 5.0  GLUCOSEU NEGATIVE  HGBUR NEGATIVE  BILIRUBINUR NEGATIVE  KETONESUR 5*  PROTEINUR 100*  NITRITE NEGATIVE  LEUKOCYTESUR NEGATIVE      Imaging: CT Head Wo Contrast  Result Date: 10/24/2020 CLINICAL DATA:  Mental status change. Unknown cause of syncopal episode on aspirin. EXAM: CT HEAD WITHOUT CONTRAST TECHNIQUE: Contiguous axial images were obtained from the base of the skull through the vertex without intravenous contrast. COMPARISON:  CT head 02/23/2018. FINDINGS: Brain: There is no evidence  of acute intracranial hemorrhage, mass lesion, brain edema or extra-axial fluid collection. Stable mild atrophy with mild prominence of the ventricles and subarachnoid spaces. There are stable chronic small vessel ischemic changes in the periventricular white matter and a stable chronic posterior right parietal infarct. There is no CT evidence of acute cortical infarction. Vascular: Intracranial vascular calcifications. No hyperdense vessel identified. Skull: Negative for fracture or focal lesion. Sinuses/Orbits: The visualized paranasal sinuses and mastoid air cells are clear. No orbital abnormalities are seen. Other: Previous bilateral lens surgery. IMPRESSION: 1. Stable head CT without evidence of acute intracranial process. 2. Stable  atrophy and chronic small vessel ischemic changes as described. Electronically Signed   By: Richardean Sale M.D.   On: 10/24/2020 12:10   DG Chest Port 1 View  Result Date: 10/25/2020 CLINICAL DATA:  Known heart failure history EXAM: PORTABLE CHEST 1 VIEW COMPARISON:  10/24/2020 FINDINGS: Cardiac shadow remains enlarged. Aortic calcifications are again seen. Slight increase in the degree of vascular congestion is noted when compare with the previous day. No significant interstitial edema is noted at this time. No focal infiltrate is seen. No bony abnormality is noted. IMPRESSION: Mild vascular congestion without significant edema. Electronically Signed   By: Inez Catalina M.D.   On: 10/25/2020 18:46   DG Chest Portable 1 View  Result Date: 10/24/2020 CLINICAL DATA:  Syncopal episode.  Bradycardia. EXAM: PORTABLE CHEST 1 VIEW COMPARISON:  Radiographs 02/24/2018 and 01/29/2018. FINDINGS: 1202 hours. The heart size and mediastinal contours are stable. The heart size is stable at the upper limits of normal, and there is aortic atherosclerosis. The diffuse interstitial prominence seen previously has improved with mild subpleural reticulation similar to baseline radiographs. No confluent airspace opacity, pleural effusion or pneumothorax. There are surgical clips in the right axilla post right mastectomy. No acute osseous findings. Telemetry leads overlie the chest. IMPRESSION: Cardiomegaly with mild chronic interstitial lung disease. No acute cardiopulmonary process. Electronically Signed   By: Richardean Sale M.D.   On: 10/24/2020 12:14     Medications:   . sodium chloride    . metronidazole 500 mg (10/26/20 0510)  . vancomycin     . aspirin EC  81 mg Oral Daily  . cholecalciferol  2,000 Units Oral Daily  . feeding supplement  237 mL Oral BID BM  . Ferrous Fumarate  1 tablet Oral BID  . gentamicin irrigation  80 mg Irrigation On Call  . heparin  5,000 Units Subcutaneous Q8H  . hydrOXYzine  10 mg  Oral QHS  . insulin aspart  0-15 Units Subcutaneous TID WC  . latanoprost  1 drop Left Eye QHS  . loratadine  10 mg Oral Daily  . magnesium oxide  800 mg Oral Daily  . niacin  500 mg Oral QHS  . omega-3 acid ethyl esters  1 g Oral Daily  . pantoprazole  40 mg Oral Daily  . sodium bicarbonate  650 mg Oral TID  . sodium chloride flush  3 mL Intravenous Q12H  . vancomycin  125 mg Oral QID  . vitamin B-12  1,000 mcg Oral Daily   acetaminophen **OR** acetaminophen, ondansetron **OR** ondansetron (ZOFRAN) IV  Assessment/ Plan:  [name] is a 80 y.o. white female with hypertension, pulmonary hypertension, insulin dependent diabetes mellitus type II, hyperlipidemia, anemia, hepatic cirrhosis, gout, left renal mass, osteoporosis, glaucoma, rheumatoid arthritis, GERD, history of breast cancer status post right mastectomy.   1. Acute kidney injury on chronic kidney disease stage IIIB with  proteinuria. Baseline creatinine of 1.3, GFR of 40 on 08/24/20.  Followed by Dr. Holley Raring.  Anuric since admission Chronic kidney disease secondary to diabetic nephropathy.  Acute kidney injury secondary to ATN from prerenal azotemia.  No IV contrast exposure.  Renal ultrasound done prior to admission with no signs of obstruction - Repeat imaging: CT to look for new obstruction and further characterize patient's left renal mass.  - Holding losartan.   2. Hypertension with bradycardia.  - not on a beta blocker or calcium channel blocker.  - Improving with IV fluids - Appreciate cardiology input. [facility] scheduled.  - echocardiogram pending.   3. Diabetes mellitus type II with chronic kidney disease. Insulin dependent. Hemoglobin A1c of 5.3%.  - Holding metformin - Not on an SGLT-2 inhibitor as an outpatient.   4. Anemia with chronic kidney disease: macrocytic. history of iron deficiency and vitamin B12 deficiency. Currently B12 and iron levels at goal.   5. Left renal mass: ultrasound from  10/14/2020 reviewed. Increased in size since 2021. Follows by urology as outpatient.  - CT of abdomen and pelvis.    LOS: 1 Robin Pafford 4/20/20229:18 AM

## 2020-10-26 NOTE — Progress Notes (Signed)
Pt returned from Specials procedure Son and daughter in law at bedside Patient resting comfortably. She denies any pain Alert and oriented.  Son given information on pacemaker Information card also given and son will take home instead of placing in chart. Will call to set up home monitoring for pacemaker on discharge.  Copy of paperwork made and placed in chart.  Pt provided something to drink and a dinner menu.  Assessment completed as charted.  She denies nay additional wants or needs at this time Surgical dressing clean, dry and, intact.  Call bell within reach.  Will continue to closely monitor.

## 2020-10-26 NOTE — Progress Notes (Signed)
PROGRESS NOTE    Emily Faulkner  OJJ:[id number] DOB: 1941-05-11 DOA: 10/24/2020 PCP: Sofie Hartigan, MD   Chief complaint.  Syncope. Brief Narrative:  Emily Faulkner a 80 y.o.femalewith medical history significant fordiabetes mellitus with complications of stage III chronic kidney disease, hypertension, pulmonary hypertension and dyslipidemia who presents to the emergency room by EMS for evaluation of a syncopal episode. Patient also has been having poor appetite, nausea vomiting and frequent diarrhea for the last few days.  Upon arriving the hospital, C. difficile toxin was positive.  Was also found to have third-degree AV block while in the ER, improved after giving fluids. Patient also has acute kidney injury due to dehydration.   Assessment & Plan:   Principal Problem:   Syncope and collapse Active Problems:   Hypertension   AKI (acute kidney injury) ([place])   Complete heart block (H[facility])   Hyperkalemia   Odynophagia  #1.  Syncope and collapse. Transient third-degree AV block. Patient has been seen by cardiology, pacemaker placed on 4/20.  #2.  Acute kidney injury on chronic kidney disease stage IIIb secondary to dehydration. Anion gap metabolic acidosis. Hyperkalemia C. difficile colitis. Hypoxia. Patient is treated with vancomycin, ID also added Flagyl.  Diarrhea is getting better.  She has not been making urine, renal function is not getting better.  Nephrology consult is obtained.   Patient also developed some hypoxemia yesterday, she is still on oxygen today, but her lung sounds better.  She will be able to tolerate small amount of fluids for acute renal failure.  #3.  Type 2 diabetes Continue sliding scale insulin.  4.  Anemia of chronic disease. Iron B12 level adequate.    DVT prophylaxis: Heparin Code Status: Full Family Communication:  Disposition Plan:  .   Status is: Inpatient  Remains inpatient appropriate because:Inpatient level of care  appropriate due to severity of illness   Dispo: The patient is from: Home              Anticipated d/c is to: Home              Patient currently is not medically stable to d/c.   Difficult to place patient No        I/O last 3 completed shifts: In: 2061.9 [I.V.:2061.9] Out: -  Total I/O In: -  Out: 140 [Urine:140]     Consultants:   Nephrology and Cardiology  Procedures: Pacemaker.  Antimicrobials:  Oral vancomycin.  Subjective: Patient is less nauseous, feels like it having an appetite.  Diarrhea is slowing down. She is still on 2 L oxygen, no longer feels short of breath today.  No cough. No fever or chills No dysuria hematuria pain No chest pain or palpitation. No headache or dizziness  Objective: Vitals:   10/26/20 0518 10/26/20 0523 10/26/20 0729 10/26/20 1115  BP: (!) 118/29 (!) 131/35 (!) 122/46 (!) 139/46  Pulse: (!) 41 (!) 42 (!) 43 (!) 42  Resp: 18 17 17 18   Temp: 98.4 F (36.9 C)  97.6 F (36.4 C)   TempSrc: Oral  Oral   SpO2: 99% 99% 100% 99%  Weight:   88.2 kg 83.9 kg  Height:    5' 4"  (1.626 m)    Intake/Output Summary (Last 24 hours) at 10/26/2020 1246 Last data filed at 10/26/2020 0954 Gross per 24 hour  Intake 2061.9 ml  Output 140 ml  Net 1921.9 ml   Filed Weights   10/26/20 0500 10/26/20 0729 10/26/20 1115  Weight:  87.7 kg 88.2 kg 83.9 kg    Examination:  General exam: Appears calm and comfortable  Respiratory system: Coarse breathing sounds, no crackles. Respiratory effort normal. Cardiovascular system: Regular, bradycardic. No JVD, murmurs, rubs, gallops or clicks. No pedal edema. Gastrointestinal system: Abdomen is nondistended, soft and nontender. No organomegaly or masses felt. Normal bowel sounds heard. Central nervous system: Alert and oriented. No focal neurological deficits. Extremities: Symmetric 5 x 5 power. Skin: No rashes, lesions or ulcers Psychiatry:  Mood & affect appropriate.     Data Reviewed: I have  personally reviewed following labs and imaging studies  CBC: Recent Labs  Lab 10/24/20 1130 10/25/20 0518 10/26/20 0620  WBC 9.3 9.1 7.3  NEUTROABS 7.3  --  4.7  HGB 8.8* 7.9* 8.0*  HCT 29.3* 25.2* 26.2*  MCV 105.4* 100.8* 102.3*  PLT 205 204 623   Basic Metabolic Panel: Recent Labs  Lab 10/24/20 1130 10/25/20 0518 10/26/20 0508  NA 136 137 139  K 5.2* 4.5 4.5  CL 107 105 105  CO2 15* 20* 20*  GLUCOSE 169* 135* 114*  BUN 52* 53* 58*  CREATININE 3.79* 3.24* 3.57*  CALCIUM 9.1 8.5* 8.2*  MG 2.8*  --  2.7*  PHOS  --   --  4.8*   GFR: Estimated Creatinine Clearance: 13.4 mL/min (A) (by C-G formula based on SCr of 3.57 mg/dL (H)). Liver Function Tests: Recent Labs  Lab 10/24/20 1130  AST 30  ALT 13  ALKPHOS 55  BILITOT 1.0  PROT 6.9  ALBUMIN 3.6   No results for input(s): LIPASE, AMYLASE in the last 168 hours. No results for input(s): AMMONIA in the last 168 hours. Coagulation Profile: No results for input(s): INR, PROTIME in the last 168 hours. Cardiac Enzymes: Recent Labs  Lab 10/25/20 0518  CKTOTAL 472*   BNP (last 3 results) No results for input(s): PROBNP in the last 8760 hours. HbA1C: Recent Labs    10/25/20 0518  HGBA1C 5.3   CBG: Recent Labs  Lab 10/25/20 1715 10/25/20 2334 10/26/20 0515 10/26/20 0727 10/26/20 1136  GLUCAP 143* 110* 128* 121* 138*   Lipid Profile: No results for input(s): CHOL, HDL, LDLCALC, TRIG, CHOLHDL, LDLDIRECT in the last 72 hours. Thyroid Function Tests: No results for input(s): TSH, T4TOTAL, FREET4, T3FREE, THYROIDAB in the last 72 hours. Anemia Panel: Recent Labs    10/24/20 1130 10/25/20 0518  VITAMINB12 2,884*  --   TIBC  --  417  IRON  --  171*   Sepsis Labs: No results for input(s): PROCALCITON, LATICACIDVEN in the last 168 hours.  Recent Results (from the past 240 hour(s))  Resp Panel by RT-PCR (Flu A&B, Covid) Nasopharyngeal Swab     Status: None   Collection Time: 10/24/20 11:24 AM    Specimen: Nasopharyngeal Swab; Nasopharyngeal(NP) swabs in vial transport medium  Result Value Ref Range Status   SARS Coronavirus 2 by RT PCR NEGATIVE NEGATIVE Final    Comment: (NOTE) SARS-CoV-2 target nucleic acids are NOT DETECTED.  The SARS-CoV-2 RNA is generally detectable in upper respiratory specimens during the acute phase of infection. The lowest concentration of SARS-CoV-2 viral copies this assay can detect is 138 copies/mL. A negative result does not preclude SARS-Cov-2 infection and should not be used as the sole basis for treatment or other patient management decisions. A negative result may occur with  improper specimen collection/handling, submission of specimen other than nasopharyngeal swab, presence of viral mutation(s) within the areas targeted by this assay, and inadequate number  of viral copies(<138 copies/mL). A negative result must be combined with clinical observations, patient history, and epidemiological information. The expected result is Negative.  Fact Sheet for Patients:  EntrepreneurPulse.com.au  Fact Sheet for Healthcare Providers:  IncredibleEmployment.be  This test is no t yet approved or cleared by the [place] FDA and  has been authorized for detection and/or diagnosis of SARS-CoV-2 by FDA under an Emergency Use Authorization (EUA). This EUA will remain  in effect (meaning this test can be used) for the duration of the COVID-19 declaration under Section 564(b)(1) of the Act, 21 U.S.C.section 360bbb-3(b)(1), unless the authorization is terminated  or revoked sooner.       Influenza A by PCR NEGATIVE NEGATIVE Final   Influenza B by PCR NEGATIVE NEGATIVE Final    Comment: (NOTE) The Xpert Xpress SARS-CoV-2/FLU/RSV plus assay is intended as an aid in the diagnosis of influenza from Nasopharyngeal swab specimens and should not be used as a sole basis for treatment. Nasal washings and aspirates are  unacceptable for Xpert Xpress SARS-CoV-2/FLU/RSV testing.  Fact Sheet for Patients: EntrepreneurPulse.com.au  Fact Sheet for Healthcare Providers: IncredibleEmployment.be  This test is not yet approved or cleared by the [place] FDA and has been authorized for detection and/or diagnosis of SARS-CoV-2 by FDA under an Emergency Use Authorization (EUA). This EUA will remain in effect (meaning this test can be used) for the duration of the COVID-19 declaration under Section 564(b)(1) of the Act, 21 U.S.C. section 360bbb-3(b)(1), unless the authorization is terminated or revoked.  Performed at [facility], [place], [place] [address]   C Difficile Quick Screen w PCR reflex     Status: Abnormal   Collection Time: 10/24/20  4:45 PM   Specimen: STOOL  Result Value Ref Range Status   C Diff antigen POSITIVE (A) NEGATIVE Final   C Diff toxin NEGATIVE NEGATIVE Final   C Diff interpretation Results are indeterminate. See PCR results.  Final    Comment: Performed at [facility], [address] [place], [place] [address]  C. Diff by PCR, Reflexed     Status: Abnormal   Collection Time: 10/24/20  4:45 PM  Result Value Ref Range Status   Toxigenic C. Difficile by PCR POSITIVE (A) NEGATIVE Final    Comment: Positive for toxigenic C. difficile with little to no toxin production. Only treat if clinical presentation suggests symptomatic illness. Performed at [facility], [address], [place], [place] [address]   Surgical PCR screen     Status: Abnormal   Collection Time: 10/26/20  9:52 AM   Specimen: Nasal Mucosa; Nasal Swab  Result Value Ref Range Status   MRSA, PCR NEGATIVE NEGATIVE Final   Staphylococcus aureus POSITIVE (A) NEGATIVE Final    Comment: (NOTE) The Xpert SA Assay (FDA approved for NASAL specimens in patients 36 years of age and older), is one component of a comprehensive surveillance  program. It is not intended to diagnose infection nor to guide or monitor treatment. Performed at [facility], [address] [place], [place] [address]          Radiology Studies: [facility] [address] 1 View  Result Date: 10/25/2020 CLINICAL DATA:  Known heart failure history EXAM: PORTABLE CHEST 1 VIEW COMPARISON:  10/24/2020 FINDINGS: Cardiac shadow remains enlarged. Aortic calcifications are again seen. Slight increase in the degree of vascular congestion is noted when compare with the previous day. No significant interstitial edema is noted at this time. No focal  infiltrate is seen. No bony abnormality is noted. IMPRESSION: Mild vascular congestion without significant edema. Electronically Signed   By: Inez Catalina M.D.   On: 10/25/2020 18:46   ECHOCARDIOGRAM COMPLETE  Result Date: 10/26/2020    ECHOCARDIOGRAM REPORT   Patient Name:   Emily Faulkner Date of Exam: 10/26/2020 Medical Rec #:  [id number]      Height:       57.0 in Accession #:    [id number]     Weight:       193.0 lb Date of Birth:  [birth year]      BSA:          1.771 m Patient Age:    35 years       BP:           122/46 mmHg Patient Gender: F              HR:           40 bpm. Exam Location:  [facility] Procedure: 2D Echo, Color Doppler and Cardiac Doppler Indications:     I50.31 CHF-Acute Diastolic  History:         Patient has no prior history of Echocardiogram examinations.                  CKD; Risk Factors:Hypertension, Diabetes and Dyslipidemia.  Sonographer:     Charmayne Sheer RDCS (AE) Referring Phys:  [id number] Sharen Hones Diagnosing Phys: Bartholome Bill MD  Sonographer Comments: Suboptimal subcostal window. Global longitudinal strain was attempted. IMPRESSIONS  1. Left ventricular ejection fraction, by estimation, is 65 to 70%. The left ventricle has normal function. The left ventricle has no regional wall motion abnormalities. Left ventricular diastolic parameters were normal.  2. Right ventricular systolic function is normal.  The right ventricular size is normal.  3. Left atrial size was mildly dilated.  4. Right atrial size was mildly dilated.  5. The mitral valve is degenerative. Trivial mitral valve regurgitation.  6. Tricuspid valve regurgitation is moderate.  7. The aortic valve was not well visualized. Aortic valve regurgitation is trivial. Mild to moderate aortic valve sclerosis/calcification is present, without any evidence of aortic stenosis. FINDINGS  Left Ventricle: Left ventricular ejection fraction, by estimation, is 65 to 70%. The left ventricle has normal function. The left ventricle has no regional wall motion abnormalities. The left ventricular internal cavity size was normal in size. There is  borderline left ventricular hypertrophy. Left ventricular diastolic parameters were normal. Right Ventricle: The right ventricular size is normal. No increase in right ventricular wall thickness. Right ventricular systolic function is normal. Left Atrium: Left atrial size was mildly dilated. Right Atrium: Right atrial size was mildly dilated. Pericardium: There is no evidence of pericardial effusion. Mitral Valve: The mitral valve is degenerative in appearance. Trivial mitral valve regurgitation. Tricuspid Valve: The tricuspid valve is not well visualized. Tricuspid valve regurgitation is moderate. Aortic Valve: The aortic valve was not well visualized. Aortic valve regurgitation is trivial. Mild to moderate aortic valve sclerosis/calcification is present, without any evidence of aortic stenosis. Pulmonic Valve: The pulmonic valve was not well visualized. Pulmonic valve regurgitation is trivial. Aorta: The aortic root is normal in size and structure. IAS/Shunts: The interatrial septum was not well visualized. Bartholome Bill MD Electronically signed by Bartholome Bill MD Signature Date/Time: 10/26/2020/12:02:35 PM    Final         Scheduled Meds: . [MAR Hold] aspirin EC  81 mg Oral Daily  . [MAR Hold] Chlorhexidine  Gluconate  Cloth  6 each Topical [id number]  . [MAR Hold] cholecalciferol  2,000 Units Oral Daily  . [MAR Hold] feeding supplement  237 mL Oral BID BM  . [MAR Hold] Ferrous Fumarate  1 tablet Oral BID  . gentamicin irrigation  80 mg Irrigation On Call  . [MAR Hold] heparin  5,000 Units Subcutaneous Q8H  . [MAR Hold] hydrOXYzine  10 mg Oral QHS  . [MAR Hold] insulin aspart  0-15 Units Subcutaneous TID WC  . [MAR Hold] iohexol  500 mL Oral Q1 Hr x 2  . [MAR Hold] latanoprost  1 drop Left Eye QHS  . [MAR Hold] loratadine  10 mg Oral Daily  . [MAR Hold] magnesium oxide  800 mg Oral Daily  . [MAR Hold] niacin  500 mg Oral QHS  . [MAR Hold] omega-3 acid ethyl esters  1 g Oral Daily  . [MAR Hold] pantoprazole  40 mg Oral Daily  . [MAR Hold] sodium bicarbonate  650 mg Oral TID  . [MAR Hold] sodium chloride flush  3 mL Intravenous Q12H  . [MAR Hold] vancomycin  125 mg Oral QID  . [MAR Hold] vitamin B-12  1,000 mcg Oral Daily   Continuous Infusions: . sodium chloride 50 mL/hr at 10/26/20 0954  . [MAR Hold] metronidazole 500 mg (10/26/20 0510)  . vancomycin       LOS: 1 day    Time spent: 34 minutes    Sharen Hones, MD [facility]   To contact the attending provider between 7A-7P or the covering provider during after hours 7P-7A, please log into the web site [email or web address] and access using universal [place] password for that web site. If you do not have the password, please call the hospital operator.  10/26/2020, 12:46 PM

## 2020-10-26 NOTE — Progress Notes (Signed)
*  PRELIMINARY RESULTS* Echocardiogram 2D Echocardiogram has been performed.  Emily Faulkner 10/26/2020, 9:15 AM

## 2020-10-26 NOTE — Progress Notes (Signed)
PT Cancellation Note  Patient Details Name: Emily Faulkner MRN: [id number] DOB: September 19, 1940   Cancelled Treatment:    Reason Eval/Treat Not Completed: Patient at procedure or test/unavailable. Patient being taken for testing. Will re-attempt at later date/time.    Rosali Augello 10/26/2020, 10:55 AM

## 2020-10-26 NOTE — Progress Notes (Signed)
Patient Name: Emily Faulkner Date of Encounter: 10/26/2020  Hospital Problem List     Principal Problem:   Syncope and collapse Active Problems:   Hypertension   AKI (acute kidney injury) ([place])   Complete heart block (HCC)   Hyperkalemia   Odynophagia    Patient Profile      80 y.o.femalewith history ofdiabetes with stage III chronic kidney disease, hypertension, pulmonary hypertension and dyslipidemia who presented to the emergency room after a syncopal episode. She has not felt well over the last couple of days and has had very poor fluid and oral intake. She also has had diarrhea and when getting up to go the bathroom had a syncopal episode after dizziness. In the emergency room she had hyperkalemia with potassium 5.2, BUN of 52 a creatinine of 3.79 from a baseline of approximately 1.3, magnesium of 2.8 platelet count of 205,000 with an hemoglobin of 8.8. Twelve-lead EKG suggested A-V dissociation with ventricular escape in the mid 30s. She is on no AV nodal medications. Her heart rate improved somewhat with hydration. At the time of my exam she had a ventricular rate between 50 and 60. She was hemodynamically stable.  Subjective   No new complaints today.  Still feels fatigued.  Heart rate still on the upper 30s and appears to be sinus bradycardia.  Inpatient Medications    . [MAR Hold] aspirin EC  81 mg Oral Daily  . [MAR Hold] Chlorhexidine Gluconate Cloth  6 each Topical Q0600  . [MAR Hold] cholecalciferol  2,000 Units Oral Daily  . [MAR Hold] feeding supplement  237 mL Oral BID BM  . [MAR Hold] Ferrous Fumarate  1 tablet Oral BID  . [MAR Hold] heparin  5,000 Units Subcutaneous Q8H  . [MAR Hold] hydrOXYzine  10 mg Oral QHS  . [MAR Hold] insulin aspart  0-15 Units Subcutaneous TID WC  . [MAR Hold] latanoprost  1 drop Left Eye QHS  . [MAR Hold] loratadine  10 mg Oral Daily  . [MAR Hold] magnesium oxide  800 mg Oral Daily  . [MAR Hold] niacin  500 mg Oral QHS   . [MAR Hold] omega-3 acid ethyl esters  1 g Oral Daily  . [MAR Hold] pantoprazole  40 mg Oral Daily  . [MAR Hold] sodium bicarbonate  650 mg Oral TID  . [MAR Hold] sodium chloride flush  3 mL Intravenous Q12H  . [MAR Hold] vancomycin  125 mg Oral QID  . [MAR Hold] vitamin B-12  1,000 mcg Oral Daily    Vital Signs    Vitals:   10/26/20 0523 10/26/20 0729 10/26/20 1115 10/26/20 1306  BP: (!) 131/35 (!) 122/46 (!) 139/46   Pulse: (!) 42 (!) 43 (!) 42   Resp: 17 17 18    Temp:  97.6 F (36.4 C)    TempSrc:  Oral    SpO2: 99% 100% 99% 100%  Weight:  88.2 kg 83.9 kg   Height:   5' 4"  (1.626 m)     Intake/Output Summary (Last 24 hours) at 10/26/2020 1319 Last data filed at 10/26/2020 0954 Gross per 24 hour  Intake 2061.9 ml  Output 140 ml  Net 1921.9 ml   Filed Weights   10/26/20 0500 10/26/20 0729 10/26/20 1115  Weight: 87.7 kg 88.2 kg 83.9 kg    Physical Exam    GEN: Well nourished, well developed, in no acute distress.  HEENT: normal.  Neck: Supple, no JVD, carotid bruits, or masses. Cardiac: Bradycardia Respiratory:  Respirations  regular and unlabored, clear to auscultation bilaterally. GI: Soft, nontender, nondistended, BS + x 4. MS: no deformity or atrophy. Skin: warm and dry, no rash. Neuro:  Strength and sensation are intact. Psych: Normal affect.  Labs    CBC Recent Labs    10/24/20 1130 10/25/20 0518 10/26/20 0620  WBC 9.3 9.1 7.3  NEUTROABS 7.3  --  4.7  HGB 8.8* 7.9* 8.0*  HCT 29.3* 25.2* 26.2*  MCV 105.4* 100.8* 102.3*  PLT 205 204 798   Basic Metabolic Panel Recent Labs    10/24/20 1130 10/25/20 0518 10/26/20 0508  NA 136 137 139  K 5.2* 4.5 4.5  CL 107 105 105  CO2 15* 20* 20*  GLUCOSE 169* 135* 114*  BUN 52* 53* 58*  CREATININE 3.79* 3.24* 3.57*  CALCIUM 9.1 8.5* 8.2*  MG 2.8*  --  2.7*  PHOS  --   --  4.8*   Liver Function Tests Recent Labs    10/24/20 1130  AST 30  ALT 13  ALKPHOS 55  BILITOT 1.0  PROT 6.9  ALBUMIN 3.6    No results for input(s): LIPASE, AMYLASE in the last 72 hours. Cardiac Enzymes Recent Labs    10/25/20 0518  CKTOTAL 472*   BNP No results for input(s): BNP in the last 72 hours. D-Dimer No results for input(s): DDIMER in the last 72 hours. Hemoglobin A1C Recent Labs    10/25/20 0518  HGBA1C 5.3   Fasting Lipid Panel No results for input(s): CHOL, HDL, LDLCALC, TRIG, CHOLHDL, LDLDIRECT in the last 72 hours. Thyroid Function Tests No results for input(s): TSH, T4TOTAL, T3FREE, THYROIDAB in the last 72 hours.  Invalid input(s): FREET3  Telemetry    Sinus bradycardia  ECG    Sinus bradycardia  Radiology    CT Head Wo Contrast  Result Date: 10/24/2020 CLINICAL DATA:  Mental status change. Unknown cause of syncopal episode on aspirin. EXAM: CT HEAD WITHOUT CONTRAST TECHNIQUE: Contiguous axial images were obtained from the base of the skull through the vertex without intravenous contrast. COMPARISON:  CT head 02/23/2018. FINDINGS: Brain: There is no evidence of acute intracranial hemorrhage, mass lesion, brain edema or extra-axial fluid collection. Stable mild atrophy with mild prominence of the ventricles and subarachnoid spaces. There are stable chronic small vessel ischemic changes in the periventricular white matter and a stable chronic posterior right parietal infarct. There is no CT evidence of acute cortical infarction. Vascular: Intracranial vascular calcifications. No hyperdense vessel identified. Skull: Negative for fracture or focal lesion. Sinuses/Orbits: The visualized paranasal sinuses and mastoid air cells are clear. No orbital abnormalities are seen. Other: Previous bilateral lens surgery. IMPRESSION: 1. Stable head CT without evidence of acute intracranial process. 2. Stable atrophy and chronic small vessel ischemic changes as described. Electronically Signed   By: Richardean Sale M.D.   On: 10/24/2020 12:10   US RENAL  Result Date: 10/17/2020 CLINICAL DATA:   Surveillance of left renal mass. EXAM: RENAL / URINARY TRACT ULTRASOUND COMPLETE COMPARISON:  Prior yearly ultrasound studies dating back 2019. FINDINGS: Right Kidney: Renal measurements: 9.3 x 3.6 x 4.1 cm = volume: 73 mL. Stable mild atrophy. No hydronephrosis. No right-sided renal lesions identified by ultrasound. Left Kidney: Renal measurements: 9.7 x 4.4 x 5.3 cm = volume: 118 mL. Interpolar mass centered at the level of the renal cortex currently measures approximately 2.9 x 2.8 x 2.5 cm and shows gradual enlargement since initial detection. Bladder: Appears normal for degree of bladder distention. Other: None. IMPRESSION:  Enlargement of interpolar left renal mass by ultrasound over time with maximal diameter currently estimated to be approximately 2.9 cm. Further characterization and more accurate measurements may be possible by CT or MRI. Electronically Signed   By: Aletta Edouard M.D.   On: 10/17/2020 09:25   DG Chest Port 1 View  Result Date: 10/25/2020 CLINICAL DATA:  Known heart failure history EXAM: PORTABLE CHEST 1 VIEW COMPARISON:  10/24/2020 FINDINGS: Cardiac shadow remains enlarged. Aortic calcifications are again seen. Slight increase in the degree of vascular congestion is noted when compare with the previous day. No significant interstitial edema is noted at this time. No focal infiltrate is seen. No bony abnormality is noted. IMPRESSION: Mild vascular congestion without significant edema. Electronically Signed   By: Inez Catalina M.D.   On: 10/25/2020 18:46   DG Chest Portable 1 View  Result Date: 10/24/2020 CLINICAL DATA:  Syncopal episode.  Bradycardia. EXAM: PORTABLE CHEST 1 VIEW COMPARISON:  Radiographs 02/24/2018 and 01/29/2018. FINDINGS: 1202 hours. The heart size and mediastinal contours are stable. The heart size is stable at the upper limits of normal, and there is aortic atherosclerosis. The diffuse interstitial prominence seen previously has improved with mild subpleural  reticulation similar to baseline radiographs. No confluent airspace opacity, pleural effusion or pneumothorax. There are surgical clips in the right axilla post right mastectomy. No acute osseous findings. Telemetry leads overlie the chest. IMPRESSION: Cardiomegaly with mild chronic interstitial lung disease. No acute cardiopulmonary process. Electronically Signed   By: Richardean Sale M.D.   On: 10/24/2020 12:14   ECHOCARDIOGRAM COMPLETE  Result Date: 10/26/2020    ECHOCARDIOGRAM REPORT   Patient Name:   Emily Faulkner Date of Exam: 10/26/2020 Medical Rec #:  [id number]      Height:       57.0 in Accession #:    [id number]     Weight:       193.0 lb Date of Birth:  1940/10/21      BSA:          1.771 m Patient Age:    28 years       BP:           122/46 mmHg Patient Gender: F              HR:           40 bpm. Exam Location:  [facility] Procedure: 2D Echo, Color Doppler and Cardiac Doppler Indications:     I50.31 CHF-Acute Diastolic  History:         Patient has no prior history of Echocardiogram examinations.                  CKD; Risk Factors:Hypertension, Diabetes and Dyslipidemia.  Sonographer:     Charmayne Sheer RDCS (AE) Referring Phys:  [id number] Sharen Hones Diagnosing Phys: Bartholome Bill MD  Sonographer Comments: Suboptimal subcostal window. Global longitudinal strain was attempted. IMPRESSIONS  1. Left ventricular ejection fraction, by estimation, is 65 to 70%. The left ventricle has normal function. The left ventricle has no regional wall motion abnormalities. Left ventricular diastolic parameters were normal.  2. Right ventricular systolic function is normal. The right ventricular size is normal.  3. Left atrial size was mildly dilated.  4. Right atrial size was mildly dilated.  5. The mitral valve is degenerative. Trivial mitral valve regurgitation.  6. Tricuspid valve regurgitation is moderate.  7. The aortic valve was not well visualized. Aortic valve regurgitation is trivial. Mild to moderate aortic  valve  sclerosis/calcification is present, without any evidence of aortic stenosis. FINDINGS  Left Ventricle: Left ventricular ejection fraction, by estimation, is 65 to 70%. The left ventricle has normal function. The left ventricle has no regional wall motion abnormalities. The left ventricular internal cavity size was normal in size. There is  borderline left ventricular hypertrophy. Left ventricular diastolic parameters were normal. Right Ventricle: The right ventricular size is normal. No increase in right ventricular wall thickness. Right ventricular systolic function is normal. Left Atrium: Left atrial size was mildly dilated. Right Atrium: Right atrial size was mildly dilated. Pericardium: There is no evidence of pericardial effusion. Mitral Valve: The mitral valve is degenerative in appearance. Trivial mitral valve regurgitation. Tricuspid Valve: The tricuspid valve is not well visualized. Tricuspid valve regurgitation is moderate. Aortic Valve: The aortic valve was not well visualized. Aortic valve regurgitation is trivial. Mild to moderate aortic valve sclerosis/calcification is present, without any evidence of aortic stenosis. Pulmonic Valve: The pulmonic valve was not well visualized. Pulmonic valve regurgitation is trivial. Aorta: The aortic root is normal in size and structure. IAS/Shunts: The interatrial septum was not well visualized. Bartholome Bill MD Electronically signed by Bartholome Bill MD Signature Date/Time: 10/26/2020/12:02:35 PM    Final     Assessment & Plan     80 year old female with history of chronic kidney disease hypertension who presented to emergency room with several days of poor p.o. intake, diarrhea and syncope when standing up. Noted to have A-V dissociation on electrocardiogram with slow ventricular response. Also had acute on chronic renal insufficiency with a creatinine remaining in the mid to high 3 range.  1. A-V dissociation-likely secondary to acute renal insufficiency  at least in part.  Renal function has improved slightly but still severely decreased.  Potassium is improved.  No prolonged pauses.  Hemodynamically stable.  Remains bradycardic with intermittent AV conduction.   Is on abx for c diff. She is stable hemodynamically but slow ventricular rate is concerning. Likely secondary to renal insufficiency.  Echocardiogram done on last week revealed normal LV function with mild to moderately enlarged right and left atrium, and moderately elevated right-sided pressures with an estimated RV systolic pressure of 48 mmHg.   Plan to place dual-chamber permanent pacemaker today.  Have discussed with infectious disease who feels it is safe to proceed.  Patient is on IV Flagyl and p.o. vancomycin.  2. Acute renal insufficiency-likely secondary to diarrhea and severe p.o. intake. Will need to follow electrolytes, renal function and hemodynamics.  Signed, Javier Docker Sabree Nuon MD 10/26/2020, 1:19 PM  Pager: [phone]

## 2020-10-26 NOTE — TOC Benefit Eligibility Note (Signed)
Patient Advocate Encounter  Prior Authorization for Vancomycin 125 mg has been approved.    PA# [id number] Effective dates: 10/26/2020 through 07/08/2021  Patients co-pay is $3.95.     Lyndel Safe, [place] Patient Advocate Specialist [facility] Antimicrobial Stewardship Team Direct Number: [phone]  Fax: [phone]

## 2020-10-27 ENCOUNTER — Encounter: Payer: Self-pay | Admitting: Cardiology

## 2020-10-27 ENCOUNTER — Inpatient Hospital Stay: Payer: Medicare HMO

## 2020-10-27 LAB — CBC WITH DIFFERENTIAL/PLATELET
Abs Immature Granulocytes: 0.04 10*3/uL (ref 0.00–0.07)
Basophils Absolute: 0.1 10*3/uL (ref 0.0–0.1)
Basophils Relative: 1 %
Eosinophils Absolute: 0.4 10*3/uL (ref 0.0–0.5)
Eosinophils Relative: 6 %
HCT: 27 % — ABNORMAL LOW (ref 36.0–46.0)
Hemoglobin: 8.5 g/dL — ABNORMAL LOW (ref 12.0–15.0)
Immature Granulocytes: 1 %
Lymphocytes Relative: 15 %
Lymphs Abs: 1.1 10*3/uL (ref 0.7–4.0)
MCH: 32.1 pg (ref 26.0–34.0)
MCHC: 31.5 g/dL (ref 30.0–36.0)
MCV: 101.9 fL — ABNORMAL HIGH (ref 80.0–100.0)
Monocytes Absolute: 0.6 10*3/uL (ref 0.1–1.0)
Monocytes Relative: 9 %
Neutro Abs: 4.8 10*3/uL (ref 1.7–7.7)
Neutrophils Relative %: 68 %
Platelets: 144 10*3/uL — ABNORMAL LOW (ref 150–400)
RBC: 2.65 MIL/uL — ABNORMAL LOW (ref 3.87–5.11)
RDW: 16.8 % — ABNORMAL HIGH (ref 11.5–15.5)
WBC: 6.9 10*3/uL (ref 4.0–10.5)
nRBC: 0 % (ref 0.0–0.2)

## 2020-10-27 LAB — BASIC METABOLIC PANEL
Anion gap: 11 (ref 5–15)
BUN: 59 mg/dL — ABNORMAL HIGH (ref 8–23)
CO2: 22 mmol/L (ref 22–32)
Calcium: 8.4 mg/dL — ABNORMAL LOW (ref 8.9–10.3)
Chloride: 105 mmol/L (ref 98–111)
Creatinine, Ser: 2.96 mg/dL — ABNORMAL HIGH (ref 0.44–1.00)
GFR, Estimated: 16 mL/min — ABNORMAL LOW (ref >60)
Glucose, Bld: 147 mg/dL — ABNORMAL HIGH (ref 70–99)
Potassium: 3.9 mmol/L (ref 3.5–5.1)
Sodium: 138 mmol/L (ref 135–145)

## 2020-10-27 LAB — GLUCOSE, CAPILLARY
Glucose-Capillary: 140 mg/dL — ABNORMAL HIGH (ref 70–99)
Glucose-Capillary: 170 mg/dL — ABNORMAL HIGH (ref 70–99)
Glucose-Capillary: 114 mg/dL — ABNORMAL HIGH (ref 70–99)
Glucose-Capillary: 199 mg/dL — ABNORMAL HIGH (ref 70–99)

## 2020-10-27 LAB — MAGNESIUM: Magnesium: 2.7 mg/dL — ABNORMAL HIGH (ref 1.7–2.4)

## 2020-10-27 NOTE — TOC Initial Note (Signed)
Transition of Care [facility]) - Initial/Assessment Note    Patient Details  Name: Emily Faulkner MRN: [id number] Date of Birth: March 30, 1941  Transition of Care [facility]) CM/SW Contact:    Emily Salen, RN Phone Number: 10/27/2020, 2:21 PM  Clinical Narrative:  Called and spoke with son, Emily Faulkner who says his mother lives alone, very independent able to do ADL's, still drives to medical visits and does cooking and shopping. Use cane sometimes, however not often. [facility] [place], able to pick up meds however sometimes he or one of his brothers will. Patient with 3 sons all lives in the area and assist as needed. Per Emily Faulkner patient with a friend who assist monthly with chores. Emily Faulkner says he spoke with Patient and she says she feels so much better and ready to discharge home.   Emily Faulkner says he or one of his brothers will transport home.              Expected Discharge Plan: Home/Self Care Barriers to Discharge: Continued Medical Work up   Patient Goals and CMS Choice Patient states their goals for this hospitalization and ongoing recovery are:: To return home per son, Emily Faulkner.   Choice offered to / list presented to : NA  Expected Discharge Plan and Services Expected Discharge Plan: Home/Self Care In-house Referral: Clinical Social Work Discharge Planning Services: NA Post Acute Care Choice: NA Living arrangements for the past 2 months: Single Family Home                                      Prior Living Arrangements/Services Living arrangements for the past 2 months: Single Family Home Lives with:: Self Patient language and need for interpreter reviewed:: No (spoke with Son by phone) Do you feel safe going back to the place where you live?: Yes (According to Son)      Need for Family Participation in Patient Care: Yes (Comment) Care giver support system in place?: Yes (comment)      Activities of Daily Living Home Assistive Devices/Equipment: Cane  (specify quad or straight),Dentures (specify type) ADL Screening (condition at time of admission) Patient's cognitive ability adequate to safely complete daily activities?: Yes Is the patient deaf or have difficulty hearing?: No Does the patient have difficulty seeing, even when wearing glasses/contacts?: No Does the patient have difficulty concentrating, remembering, or making decisions?: No Patient able to express need for assistance with ADLs?: Yes Does the patient have difficulty dressing or bathing?: No Independently performs ADLs?: Yes (appropriate for developmental age) Does the patient have difficulty walking or climbing stairs?: No Weakness of Legs: Both Weakness of Arms/Hands: None  Permission Sought/Granted                  Emotional Assessment         Alcohol / Substance Use: Not Applicable Psych Involvement: No (comment)  Admission diagnosis:  Syncope and collapse [R55] Complete heart block ([place]) [I44.2] AKI (acute kidney injury) ([place]) [N17.9] Patient Active Problem List   Diagnosis Date Noted  . Syncope and collapse 10/24/2020  . Complete heart block ([place]) 10/24/2020  . Hyperkalemia 10/24/2020  . Odynophagia 10/24/2020  . Age-related osteoporosis without current pathological fracture 05/09/2018  . AKI (acute kidney injury) ([place]) 03/17/2018  . Renal mass 03/17/2018  . Microalbuminuric diabetic nephropathy ([place]) 11/02/2016  . Pulmonary hypertension ([place]) 09/11/2016  . Shortness of breath 09/06/2016  .  Hypertension 09/06/2016  . High cholesterol 09/06/2016  . Anemia 09/06/2016  . Iron deficiency anemia 04/15/2016  . Type 2 diabetes mellitus with neurological manifestations, uncontrolled ([place]) 08/04/2014  . Hyperlipidemia, unspecified 01/22/2014  . Personal history of breast cancer 07/17/2012   PCP:  Sofie Hartigan, MD Pharmacy:   [id number] [place] [place] [address]Phone: [phone]:  [phone]    Social Determinants of Health (SDOH) Interventions    Readmission Risk Interventions Readmission Risk Prevention Plan 03/21/2018  Transportation Screening Complete  PCP or Specialist Appt within 5-7 Days Complete  Home Care Screening Complete  Medication Review (RN CM) Complete  Some recent data might be hidden

## 2020-10-27 NOTE — Progress Notes (Addendum)
Patient Name: Emily Faulkner Date of Encounter: 10/27/2020  Hospital Problem List     Principal Problem:   Syncope and collapse Active Problems:   Hypertension   AKI (acute kidney injury) ([place])   Complete heart block ([facility])   Hyperkalemia   Odynophagia    Patient Profile       80 y.o.femalewith history ofdiabetes with stage III chronic kidney disease, hypertension, pulmonary hypertension and dyslipidemia who presented to the emergency room after a syncopal episode. She has not felt well over the last couple of days and has had very poor fluid and oral intake. She also has had diarrhea and when getting up to go the bathroom had a syncopal episode after dizziness. In the emergency room she had hyperkalemia with potassium 5.2, BUN of 52 a creatinine of 3.79 from a baseline of approximately 1.3, magnesium of 2.8 platelet count of 205,000 with an hemoglobin of 8.8. Twelve-lead EKG suggested A-V dissociation with ventricular escape in the mid 30s. She remained with sinus bradycardia in the upper 30s.  Based on this underwent placement of a dual-chamber permanent pacemaker.  She is currently ventricular pacing in the 8s.  She feels better since pacing.  Subjective   Feels better with less fatigue.  Inpatient Medications    . aspirin EC  81 mg Oral Daily  . Chlorhexidine Gluconate Cloth  6 each Topical Q0600  . cholecalciferol  2,000 Units Oral Daily  . feeding supplement  237 mL Oral BID BM  . Ferrous Fumarate  1 tablet Oral BID  . heparin  5,000 Units Subcutaneous Q8H  . hydrOXYzine  10 mg Oral QHS  . insulin aspart  0-15 Units Subcutaneous TID WC  . latanoprost  1 drop Left Eye QHS  . loratadine  10 mg Oral Daily  . magnesium oxide  800 mg Oral Daily  . niacin  500 mg Oral QHS  . omega-3 acid ethyl esters  1 g Oral Daily  . pantoprazole  40 mg Oral Daily  . sodium bicarbonate  650 mg Oral TID  . sodium chloride flush  3 mL Intravenous Q12H  . vancomycin  125 mg Oral  QID  . vitamin B-12  1,000 mcg Oral Daily    Vital Signs    Vitals:   10/26/20 1515 10/26/20 1541 10/26/20 2052 10/27/20 0501  BP: (!) 151/47 (!) 127/50 (!) 135/45 (!) 128/48  Pulse: 77 72 80 72  Resp: (!) 21 17 18 20   Temp:  98.1 F (36.7 C) 98.9 F (37.2 C) 97.6 F (36.4 C)  TempSrc:  Oral Oral   SpO2: 100% 99% 99% 100%  Weight:    88 kg  Height:        Intake/Output Summary (Last 24 hours) at 10/27/2020 0818 Last data filed at 10/27/2020 0300 Gross per 24 hour  Intake 1009.77 ml  Output 240 ml  Net 769.77 ml   Filed Weights   10/26/20 0729 10/26/20 1115 10/27/20 0501  Weight: 88.2 kg 83.9 kg 88 kg    Physical Exam    GEN: Chronically ill-appearing female HEENT: normal.  Neck: Supple, no JVD, carotid bruits, or masses. Cardiac: RRR, no murmurs, rubs, or gallops.   Respiratory:  Respirations regular and unlabored, clear to auscultation bilaterally. GI: Soft, nontender, nondistended, BS + x 4. MS: no deformity or atrophy. Skin: warm and dry, no rash. Neuro:  Strength and sensation are intact. Psych: Normal affect.  Labs    CBC Recent Labs    10/26/20  3267 10/27/20 0502  WBC 7.3 6.9  NEUTROABS 4.7 4.8  HGB 8.0* 8.5*  HCT 26.2* 27.0*  MCV 102.3* 101.9*  PLT 161 124*   Basic Metabolic Panel Recent Labs    10/26/20 0508 10/27/20 0502  NA 139 138  K 4.5 3.9  CL 105 105  CO2 20* 22  GLUCOSE 114* 147*  BUN 58* 59*  CREATININE 3.57* 2.96*  CALCIUM 8.2* 8.4*  MG 2.7* 2.7*  PHOS 4.8*  --    Liver Function Tests Recent Labs    10/24/20 1130  AST 30  ALT 13  ALKPHOS 55  BILITOT 1.0  PROT 6.9  ALBUMIN 3.6   No results for input(s): LIPASE, AMYLASE in the last 72 hours. Cardiac Enzymes Recent Labs    10/25/20 0518  CKTOTAL 472*   BNP No results for input(s): BNP in the last 72 hours. D-Dimer No results for input(s): DDIMER in the last 72 hours. Hemoglobin A1C Recent Labs    10/25/20 0518  HGBA1C 5.3   Fasting Lipid Panel No  results for input(s): CHOL, HDL, LDLCALC, TRIG, CHOLHDL, LDLDIRECT in the last 72 hours. Thyroid Function Tests No results for input(s): TSH, T4TOTAL, T3FREE, THYROIDAB in the last 72 hours.  Invalid input(s): FREET3  Telemetry    Ventricular paced  ECG    Bradycardia with high-grade heart block.  Radiology    DG Ankle 2 Views Right  Result Date: 10/26/2020 CLINICAL DATA:  Right ankle pain and bruising after a fall. EXAM: RIGHT ANKLE - 2 VIEW COMPARISON:  None. FINDINGS: Two-view exam shows no dislocation. Lateral film suggests the presence of a cortical step-off along the posterior cortex of the distal fibula, projecting through the tibia. No associated subluxation. IMPRESSION: Lateral film suggests a nondisplaced fracture of the distal fibula. Correlation for point tenderness in this region recommended. Dedicated three-view exam or CT imaging could be used to further evaluate as clinically warranted. Electronically Signed   By: Misty Stanley M.D.   On: 10/26/2020 16:11   CT Head Wo Contrast  Result Date: 10/24/2020 CLINICAL DATA:  Mental status change. Unknown cause of syncopal episode on aspirin. EXAM: CT HEAD WITHOUT CONTRAST TECHNIQUE: Contiguous axial images were obtained from the base of the skull through the vertex without intravenous contrast. COMPARISON:  CT head 02/23/2018. FINDINGS: Brain: There is no evidence of acute intracranial hemorrhage, mass lesion, brain edema or extra-axial fluid collection. Stable mild atrophy with mild prominence of the ventricles and subarachnoid spaces. There are stable chronic small vessel ischemic changes in the periventricular white matter and a stable chronic posterior right parietal infarct. There is no CT evidence of acute cortical infarction. Vascular: Intracranial vascular calcifications. No hyperdense vessel identified. Skull: Negative for fracture or focal lesion. Sinuses/Orbits: The visualized paranasal sinuses and mastoid air cells are  clear. No orbital abnormalities are seen. Other: Previous bilateral lens surgery. IMPRESSION: 1. Stable head CT without evidence of acute intracranial process. 2. Stable atrophy and chronic small vessel ischemic changes as described. Electronically Signed   By: Richardean Sale M.D.   On: 10/24/2020 12:10   US RENAL  Result Date: 10/17/2020 CLINICAL DATA:  Surveillance of left renal mass. EXAM: RENAL / URINARY TRACT ULTRASOUND COMPLETE COMPARISON:  Prior yearly ultrasound studies dating back 2019. FINDINGS: Right Kidney: Renal measurements: 9.3 x 3.6 x 4.1 cm = volume: 73 mL. Stable mild atrophy. No hydronephrosis. No right-sided renal lesions identified by ultrasound. Left Kidney: Renal measurements: 9.7 x 4.4 x 5.3 cm = volume: 118  mL. Interpolar mass centered at the level of the renal cortex currently measures approximately 2.9 x 2.8 x 2.5 cm and shows gradual enlargement since initial detection. Bladder: Appears normal for degree of bladder distention. Other: None. IMPRESSION: Enlargement of interpolar left renal mass by ultrasound over time with maximal diameter currently estimated to be approximately 2.9 cm. Further characterization and more accurate measurements may be possible by CT or MRI. Electronically Signed   By: Aletta Edouard M.D.   On: 10/17/2020 09:25   EP PPM/ICD IMPLANT  Result Date: 10/26/2020 Successful dual-chamber pacemaker implantation  DG Chest Port 1 View  Result Date: 10/25/2020 CLINICAL DATA:  Known heart failure history EXAM: PORTABLE CHEST 1 VIEW COMPARISON:  10/24/2020 FINDINGS: Cardiac shadow remains enlarged. Aortic calcifications are again seen. Slight increase in the degree of vascular congestion is noted when compare with the previous day. No significant interstitial edema is noted at this time. No focal infiltrate is seen. No bony abnormality is noted. IMPRESSION: Mild vascular congestion without significant edema. Electronically Signed   By: Inez Catalina M.D.   On:  10/25/2020 18:46   DG Chest Portable 1 View  Result Date: 10/24/2020 CLINICAL DATA:  Syncopal episode.  Bradycardia. EXAM: PORTABLE CHEST 1 VIEW COMPARISON:  Radiographs 02/24/2018 and 01/29/2018. FINDINGS: 1202 hours. The heart size and mediastinal contours are stable. The heart size is stable at the upper limits of normal, and there is aortic atherosclerosis. The diffuse interstitial prominence seen previously has improved with mild subpleural reticulation similar to baseline radiographs. No confluent airspace opacity, pleural effusion or pneumothorax. There are surgical clips in the right axilla post right mastectomy. No acute osseous findings. Telemetry leads overlie the chest. IMPRESSION: Cardiomegaly with mild chronic interstitial lung disease. No acute cardiopulmonary process. Electronically Signed   By: Richardean Sale M.D.   On: 10/24/2020 12:14   ECHOCARDIOGRAM COMPLETE  Result Date: 10/26/2020    ECHOCARDIOGRAM REPORT   Patient Name:   Emily Faulkner Date of Exam: 10/26/2020 Medical Rec #:  [id number]      Height:       57.0 in Accession #:    [id number]     Weight:       193.0 lb Date of Birth:  05-Nov-1940      BSA:          1.771 m Patient Age:    32 years       BP:           122/46 mmHg Patient Gender: F              HR:           40 bpm. Exam Location:  [facility] Procedure: 2D Echo, Color Doppler and Cardiac Doppler Indications:     I50.31 CHF-Acute Diastolic  History:         Patient has no prior history of Echocardiogram examinations.                  CKD; Risk Factors:Hypertension, Diabetes and Dyslipidemia.  Sonographer:     Charmayne Sheer RDCS (AE) Referring Phys:  [id number] Sharen Hones Diagnosing Phys: Bartholome Bill MD  Sonographer Comments: Suboptimal subcostal window. Global longitudinal strain was attempted. IMPRESSIONS  1. Left ventricular ejection fraction, by estimation, is 65 to 70%. The left ventricle has normal function. The left ventricle has no regional wall motion abnormalities. Left  ventricular diastolic parameters were normal.  2. Right ventricular systolic function is normal. The right ventricular size is normal.  3. Left atrial size was mildly dilated.  4. Right atrial size was mildly dilated.  5. The mitral valve is degenerative. Trivial mitral valve regurgitation.  6. Tricuspid valve regurgitation is moderate.  7. The aortic valve was not well visualized. Aortic valve regurgitation is trivial. Mild to moderate aortic valve sclerosis/calcification is present, without any evidence of aortic stenosis. FINDINGS  Left Ventricle: Left ventricular ejection fraction, by estimation, is 65 to 70%. The left ventricle has normal function. The left ventricle has no regional wall motion abnormalities. The left ventricular internal cavity size was normal in size. There is  borderline left ventricular hypertrophy. Left ventricular diastolic parameters were normal. Right Ventricle: The right ventricular size is normal. No increase in right ventricular wall thickness. Right ventricular systolic function is normal. Left Atrium: Left atrial size was mildly dilated. Right Atrium: Right atrial size was mildly dilated. Pericardium: There is no evidence of pericardial effusion. Mitral Valve: The mitral valve is degenerative in appearance. Trivial mitral valve regurgitation. Tricuspid Valve: The tricuspid valve is not well visualized. Tricuspid valve regurgitation is moderate. Aortic Valve: The aortic valve was not well visualized. Aortic valve regurgitation is trivial. Mild to moderate aortic valve sclerosis/calcification is present, without any evidence of aortic stenosis. Pulmonic Valve: The pulmonic valve was not well visualized. Pulmonic valve regurgitation is trivial. Aorta: The aortic root is normal in size and structure. IAS/Shunts: The interatrial septum was not well visualized. Bartholome Bill MD Electronically signed by Bartholome Bill MD Signature Date/Time: 10/26/2020/12:02:35 PM    Final     Assessment &  Plan      80 year old female with history of chronic kidney disease hypertension who presented to emergency room with several days of poor p.o. intake, diarrhea and syncope when standing up. Noted to have A-V dissociation on electrocardiogram with slow ventricular response. Acute on chronic renal insufficiency  1. A-V dissociation-likely secondary to acute renal insufficiencyat least in part. Renal function has improved slightly but still severely decreased. Potassium is improved. No prolonged pauses. Hemodynamically stable. Remains bradycardic with intermittent AV conduction. Is on abx for c diff. She is stable hemodynamically but slow ventricular rate is concerning. Likely secondary to renal insufficiency.Echocardiogram done on last week revealed normal LV function with mild to moderately enlarged right and left atrium, and moderately elevated right-sided pressures with an estimated RV systolic pressure of 48 mmHg.   Has a dual-chamber pacemaker in place.  Ventricular pacing in the 70s.  Feels much better.  We will continue to follow.  Does not appear to require any further cardiac work-up at present.. Doing well from cardiac standont. Does not appear to require further cardiac workup during this admission  2. Acute renal insufficiency-likely secondary to diarrhea and severe p.o. intake. Will need to follow electrolytes, renal function and hemodynamics.  Renal function slightly improved this morning with a creatinine improved from 3.57-2.96.  Her baseline appears to be around 1.2.  Abdominal and pelvic CT pending.  Right kidney has no significant abnormalities by ultrasound no hydronephrosis.  Left kidney has a 2.9 x 2.8 x 2.5 cm mass.  Being followed by urology.  3.  Ankle pain-x-ray reveals nondisplaced fracture of the distal fibula.  Orthopedics evaluating.  4.  C. difficile-C. difficile toxin positive.  On oral vancomycin and IV Flagyl.  Continue.  5.  Hypoxia-improved  somewhat with less crackles.  Still on nasal cannula oxygen.  Pulse ox 100% on 2 L.  Consider trial on room air oxygen today.  6.  Anemia of  chronic disease-stable     Signed, Javier Docker. Valicia Rief MD 10/27/2020, 8:18 AM  Pager: [phone]   Dr. Clayborn Bigness with be covering my patients Friday through Sunday if further assistance is needed

## 2020-10-27 NOTE — Progress Notes (Signed)
PT Cancellation Note  Patient Details Name: Emily Faulkner MRN: [id number] DOB: [birth year]   Cancelled Treatment:    Reason Eval/Treat Not Completed: Patient not medically ready. Pt pending orthopedic consult, PT to hold until POC or weight bearing orders are established.    Lieutenant Diego PT, DPT 1:07 PM,10/27/20

## 2020-10-27 NOTE — Progress Notes (Signed)
[facility]  ROUNDING NOTE   Subjective:   [name] was admitted to [facility] on 10/24/2020 for Syncope and collapse [R55] Complete heart block ([place]) [I44.2] AKI (acute kidney injury) ([place]) [N17.9]  Patient had a syncopal episode. Patient states she has been having diarrhea and poor PO intake. Patient was found to have C. Diff colitis. She also complains of dysphagia and odynophagia. Thought to be secondary to bisphosphonate.   Patient found to have bradycardia with heart block. Pacemaker scheduled today.   Patient seen resting in bed, smiling  Alert and oriented Tolerating meals Continues to have diarrhea   Objective:  Vital signs in last 24 hours:  Temp:  [97.6 F (36.4 C)-98.9 F (37.2 C)] 97.7 F (36.5 C) (04/21 1136) Pulse Rate:  [72-82] 82 (04/21 1136) Resp:  [17-21] 18 (04/21 1136) BP: (112-155)/(38-93) 150/38 (04/21 1136) SpO2:  [98 %-100 %] 98 % (04/21 1136) Weight:  [88 kg] 88 kg (04/21 0501)  Weight change: 0.479 kg Filed Weights   10/26/20 0729 10/26/20 1115 10/27/20 0501  Weight: 88.2 kg 83.9 kg 88 kg    Intake/Output: I/O last 3 completed shifts: In: 1012.8 [P.O.:480; I.V.:73.9; IV Piggyback:458.9] Out: 240 [Urine:240]   Intake/Output this shift:  No intake/output data recorded.  Physical Exam: General: NAD, laying in bed  Head: Normocephalic, atraumatic. Dry oral mucosal membranes  Eyes: Anicteric  Neck: Supple, trachea midline  Lungs:  Clear to auscultation  Heart: Regular rate and rhythm  Abdomen:  Soft, nontender,   Extremities: no peripheral edema.  Neurologic: Nonfocal, moving all four extremities  Skin: No lesions  Access: none    Basic Metabolic Panel: Recent Labs  Lab 10/24/20 1130 10/25/20 0518 10/26/20 0508 10/27/20 0502  NA 136 137 139 138  K 5.2* 4.5 4.5 3.9  CL 107 105 105 105  CO2 15* 20* 20* 22  GLUCOSE 169* 135* 114* 147*  BUN 52* 53* 58* 59*  CREATININE 3.79* 3.24* 3.57* 2.96*  CALCIUM 9.1 8.5*  8.2* 8.4*  MG 2.8*  --  2.7* 2.7*  PHOS  --   --  4.8*  --     Liver Function Tests: Recent Labs  Lab 10/24/20 1130  AST 30  ALT 13  ALKPHOS 55  BILITOT 1.0  PROT 6.9  ALBUMIN 3.6   No results for input(s): LIPASE, AMYLASE in the last 168 hours. No results for input(s): AMMONIA in the last 168 hours.  CBC: Recent Labs  Lab 10/24/20 1130 10/25/20 0518 10/26/20 0620 10/27/20 0502  WBC 9.3 9.1 7.3 6.9  NEUTROABS 7.3  --  4.7 4.8  HGB 8.8* 7.9* 8.0* 8.5*  HCT 29.3* 25.2* 26.2* 27.0*  MCV 105.4* 100.8* 102.3* 101.9*  PLT 205 204 161 144*    Cardiac Enzymes: Recent Labs  Lab 10/25/20 0518  CKTOTAL 472*    BNP: Invalid input(s): POCBNP  CBG: Recent Labs  Lab 10/26/20 1441 10/26/20 1707 10/26/20 2128 10/27/20 0839 10/27/20 1132  GLUCAP 125* 137* 203* 140* 170*    Microbiology: Results for orders placed or performed during the hospital encounter of 10/24/20  Resp Panel by RT-PCR (Flu A&B, Covid) Nasopharyngeal Swab     Status: None   Collection Time: 10/24/20 11:24 AM   Specimen: Nasopharyngeal Swab; Nasopharyngeal(NP) swabs in vial transport medium  Result Value Ref Range Status   SARS Coronavirus 2 by RT PCR NEGATIVE NEGATIVE Final    Comment: (NOTE) SARS-CoV-2 target nucleic acids are NOT DETECTED.  The SARS-CoV-2 RNA is generally  detectable in upper respiratory specimens during the acute phase of infection. The lowest concentration of SARS-CoV-2 viral copies this assay can detect is 138 copies/mL. A negative result does not preclude SARS-Cov-2 infection and should not be used as the sole basis for treatment or other patient management decisions. A negative result may occur with  improper specimen collection/handling, submission of specimen other than nasopharyngeal swab, presence of viral mutation(s) within the areas targeted by this assay, and inadequate number of viral copies(<138 copies/mL). A negative result must be combined with clinical  observations, patient history, and epidemiological information. The expected result is Negative.  Fact Sheet for Patients:  EntrepreneurPulse.com.au  Fact Sheet for Healthcare Providers:  IncredibleEmployment.be  This test is no t yet approved or cleared by the [place] FDA and  has been authorized for detection and/or diagnosis of SARS-CoV-2 by FDA under an Emergency Use Authorization (EUA). This EUA will remain  in effect (meaning this test can be used) for the duration of the COVID-19 declaration under Section 564(b)(1) of the Act, 21 U.S.C.section 360bbb-3(b)(1), unless the authorization is terminated  or revoked sooner.       Influenza A by PCR NEGATIVE NEGATIVE Final   Influenza B by PCR NEGATIVE NEGATIVE Final    Comment: (NOTE) The Xpert Xpress SARS-CoV-2/FLU/RSV plus assay is intended as an aid in the diagnosis of influenza from Nasopharyngeal swab specimens and should not be used as a sole basis for treatment. Nasal washings and aspirates are unacceptable for Xpert Xpress SARS-CoV-2/FLU/RSV testing.  Fact Sheet for Patients: EntrepreneurPulse.com.au  Fact Sheet for Healthcare Providers: IncredibleEmployment.be  This test is not yet approved or cleared by the [place] FDA and has been authorized for detection and/or diagnosis of SARS-CoV-2 by FDA under an Emergency Use Authorization (EUA). This EUA will remain in effect (meaning this test can be used) for the duration of the COVID-19 declaration under Section 564(b)(1) of the Act, 21 U.S.C. section 360bbb-3(b)(1), unless the authorization is terminated or revoked.  Performed at [facility], [place], [place] [address]   C Difficile Quick Screen w PCR reflex     Status: Abnormal   Collection Time: 10/24/20  4:45 PM   Specimen: STOOL  Result Value Ref Range Status   C Diff antigen POSITIVE (A) NEGATIVE  Final   C Diff toxin NEGATIVE NEGATIVE Final   C Diff interpretation Results are indeterminate. See PCR results.  Final    Comment: Performed at [facility], [address] [place], [place] [address]  C. Diff by PCR, Reflexed     Status: Abnormal   Collection Time: 10/24/20  4:45 PM  Result Value Ref Range Status   Toxigenic C. Difficile by PCR POSITIVE (A) NEGATIVE Final    Comment: Positive for toxigenic C. difficile with little to no toxin production. Only treat if clinical presentation suggests symptomatic illness. Performed at [facility], [address], [place], [place] [address]   Surgical PCR screen     Status: Abnormal   Collection Time: 10/26/20  9:52 AM   Specimen: Nasal Mucosa; Nasal Swab  Result Value Ref Range Status   MRSA, PCR NEGATIVE NEGATIVE Final   Staphylococcus aureus POSITIVE (A) NEGATIVE Final    Comment: (NOTE) The Xpert SA Assay (FDA approved for NASAL specimens in patients 81 years of age and older), is one component of a comprehensive surveillance program. It is not intended to diagnose infection nor to guide or monitor treatment. Performed at [facility], [place], [place] [address]     Coagulation Studies: No results for input(s): LABPROT, INR in the last 72 hours.  Urinalysis: Recent Labs    10/25/20 0601  COLORURINE YELLOW*  LABSPEC 1.015  PHURINE 5.0  GLUCOSEU NEGATIVE  HGBUR NEGATIVE  BILIRUBINUR NEGATIVE  KETONESUR 5*  PROTEINUR 100*  NITRITE NEGATIVE  LEUKOCYTESUR NEGATIVE      Imaging: DG Ankle 2 Views Right  Result Date: 10/26/2020 CLINICAL DATA:  Right ankle pain and bruising after a fall. EXAM: RIGHT ANKLE - 2 VIEW COMPARISON:  None. FINDINGS: Two-view exam shows no dislocation. Lateral film suggests the presence of a cortical step-off along the posterior cortex of the distal fibula, projecting through the tibia. No associated subluxation. IMPRESSION: Lateral film suggests a  nondisplaced fracture of the distal fibula. Correlation for point tenderness in this region recommended. Dedicated three-view exam or CT imaging could be used to further evaluate as clinically warranted. Electronically Signed   By: Misty Stanley M.D.   On: 10/26/2020 16:11   EP PPM/ICD IMPLANT  Result Date: 10/26/2020 Successful dual-chamber pacemaker implantation  DG Chest Port 1 View  Result Date: 10/25/2020 CLINICAL DATA:  Known heart failure history EXAM: PORTABLE CHEST 1 VIEW COMPARISON:  10/24/2020 FINDINGS: Cardiac shadow remains enlarged. Aortic calcifications are again seen. Slight increase in the degree of vascular congestion is noted when compare with the previous day. No significant interstitial edema is noted at this time. No focal infiltrate is seen. No bony abnormality is noted. IMPRESSION: Mild vascular congestion without significant edema. Electronically Signed   By: Inez Catalina M.D.   On: 10/25/2020 18:46   ECHOCARDIOGRAM COMPLETE  Result Date: 10/26/2020    ECHOCARDIOGRAM REPORT   Patient Name:   HOLIDAY MCMENAMIN Date of Exam: 10/26/2020 Medical Rec #:  [id number]      Height:       57.0 in Accession #:    [id number]     Weight:       193.0 lb Date of Birth:  03/07/1941      BSA:          1.771 m Patient Age:    59 years       BP:           122/46 mmHg Patient Gender: F              HR:           40 bpm. Exam Location:  [facility] Procedure: 2D Echo, Color Doppler and Cardiac Doppler Indications:     I50.31 CHF-Acute Diastolic  History:         Patient has no prior history of Echocardiogram examinations.                  CKD; Risk Factors:Hypertension, Diabetes and Dyslipidemia.  Sonographer:     Charmayne Sheer RDCS (AE) Referring Phys:  [id number] Sharen Hones Diagnosing Phys: Bartholome Bill MD  Sonographer Comments: Suboptimal subcostal window. Global longitudinal strain was attempted. IMPRESSIONS  1. Left ventricular ejection fraction, by estimation, is 65 to 70%. The left ventricle has normal  function. The left ventricle has no regional wall motion abnormalities. Left ventricular diastolic parameters were normal.  2. Right ventricular systolic function is normal. The right ventricular size is normal.  3. Left atrial size was mildly dilated.  4. Right atrial size was mildly dilated.  5. The mitral valve is degenerative. Trivial mitral valve regurgitation.  6. Tricuspid valve regurgitation is moderate.  7. The aortic valve was not well visualized. Aortic valve regurgitation is trivial. Mild to moderate aortic valve sclerosis/calcification is present, without any evidence of aortic stenosis. FINDINGS  Left Ventricle: Left ventricular ejection fraction, by estimation, is 65 to 70%. The left ventricle has normal function. The left ventricle has no regional wall motion abnormalities. The left ventricular internal cavity size was normal in size. There is  borderline left ventricular hypertrophy. Left ventricular diastolic parameters were normal. Right Ventricle: The right ventricular size is normal. No increase in right ventricular wall thickness. Right ventricular systolic function is normal. Left Atrium: Left atrial size was mildly dilated. Right Atrium: Right atrial size was mildly dilated. Pericardium: There is no evidence of pericardial effusion. Mitral Valve: The mitral valve is degenerative in appearance. Trivial mitral valve regurgitation. Tricuspid Valve: The tricuspid valve is not well visualized. Tricuspid valve regurgitation is moderate. Aortic Valve: The aortic valve was not well visualized. Aortic valve regurgitation is trivial. Mild to moderate aortic valve sclerosis/calcification is present, without any evidence of aortic stenosis. Pulmonic Valve: The pulmonic valve was not well visualized. Pulmonic valve regurgitation is trivial. Aorta: The aortic root is normal in size and structure. IAS/Shunts: The interatrial septum was not well visualized. Bartholome Bill MD Electronically signed by Bartholome Bill MD Signature Date/Time: 10/26/2020/12:02:35 PM    Final      Medications:   . metronidazole 500 mg (10/27/20 9323)   . aspirin EC  81 mg Oral Daily  . Chlorhexidine Gluconate Cloth  6 each Topical Q0600  . cholecalciferol  2,000 Units Oral Daily  . feeding supplement  237 mL Oral BID BM  . Ferrous Fumarate  1 tablet Oral BID  . hydrOXYzine  10 mg Oral QHS  . insulin aspart  0-15 Units Subcutaneous TID WC  . latanoprost  1 drop Left Eye QHS  . loratadine  10 mg Oral Daily  . magnesium oxide  800 mg Oral Daily  . niacin  500 mg Oral QHS  . omega-3 acid ethyl esters  1 g Oral Daily  . pantoprazole  40 mg Oral Daily  . sodium bicarbonate  650 mg Oral TID  . sodium chloride flush  3 mL Intravenous Q12H  . vancomycin  125 mg Oral QID  . vitamin B-12  1,000 mcg Oral Daily   acetaminophen **OR** acetaminophen, acetaminophen, ondansetron **OR** ondansetron (ZOFRAN) IV, ondansetron (ZOFRAN) IV  Assessment/ Plan:  Emily Faulkner is a 80 y.o. white female with hypertension, pulmonary hypertension, insulin dependent diabetes mellitus type II, hyperlipidemia, anemia, hepatic cirrhosis, gout, left renal mass, osteoporosis, glaucoma, rheumatoid arthritis, GERD, history of breast cancer status post right mastectomy.   1. Acute kidney injury on chronic kidney disease stage IIIB with proteinuria. Baseline creatinine of 1.3, GFR of 40 on 08/24/20.  Followed by Dr. Holley Raring.  Anuric since admission Chronic kidney disease secondary to diabetic nephropathy.  Acute kidney injury secondary to ATN from prerenal azotemia.  No IV contrast exposure.  Renal ultrasound done prior to admission with no signs of obstruction - Repeat imaging: CT to look for new obstruction and further characterize patient's left renal mass. Scheduled for today - Holding losartan.   2. Hypertension with bradycardia.  - not on a beta blocker or calcium channel blocker.  - Continue IV fluids - Appreciate cardiology input.  [employer] scheduled.  - echocardiogram pending.   3. Diabetes mellitus type II with chronic kidney disease. Insulin dependent. Hemoglobin A1c of 5.3%.  - Holding metformin - Not  on an SGLT-2 inhibitor as an outpatient.   4. Anemia with chronic kidney disease: macrocytic. history of iron deficiency and vitamin B12 deficiency. Currently B12 and iron levels at goal.   5. Left renal mass: ultrasound from 10/14/2020 reviewed. Increased in size since 2021. Follows by urology as outpatient.  - CT of abdomen and pelvis.    LOS: 2 Prophet Renwick 4/21/202211:45 AM

## 2020-10-27 NOTE — Consult Note (Signed)
ORTHOPAEDIC CONSULTATION  REQUESTING PHYSICIAN: Sharen Hones, MD  Chief Complaint:   Right ankle pain.  History of Present Illness: Emily Faulkner is a 80 y.o. female with multiple medical problems including coronary artery disease, hyperlipidemia, hypertension, rheumatoid arthritis, diabetes, and chronic kidney disease who lives independently.  Apparently the patient was trying to come out of her bathroom when she became lightheaded and fell, injuring her right ankle.  She was brought to the emergency room and subsequently admitted for treatment of complete heart block.  Since admission, she has undergone placement of a pacemaker.  The patient has been stabilized medically and orthopedic consultation has now been requested in regards to her right ankle injury.  The patient denies striking her head or losing consciousness as result of her fall.  However, she does recall being quite lightheaded which contributed to her fall.  She denies any chest pain or shortness of breath.  Past Medical History:  Diagnosis Date  . Cancer [facility])    breast cancer right mastectomy  . Chronic kidney disease   . Diabetes mellitus without complication ([place])   . GIB (gastrointestinal bleeding) 02/20/2018  . Glaucoma   . Hyperlipidemia   . Hypertension   . Neuropathy   . Osteoarthritis   . Rheumatoid arthritis ([place])   . Telangiectasias    Past Surgical History:  Procedure Laterality Date  . BREAST SURGERY     right mastectomy  . CHOLECYSTECTOMY    . COLONOSCOPY    . COLONOSCOPY WITH PROPOFOL N/A 08/14/2016   Procedure: COLONOSCOPY WITH PROPOFOL;  Surgeon: Lollie Sails, MD;  Location: [facility] ENDOSCOPY;  Service: Endoscopy;  Laterality: N/A;  . COLONOSCOPY WITH PROPOFOL N/A 02/22/2018   Procedure: COLONOSCOPY WITH PROPOFOL;  Surgeon: Jonathon Bellows, MD;  Location: [facility] ENDOSCOPY;  Service: Gastroenterology;  Laterality: N/A;  .  ESOPHAGOGASTRODUODENOSCOPY (EGD) WITH PROPOFOL N/A 08/14/2016   Procedure: ESOPHAGOGASTRODUODENOSCOPY (EGD) WITH PROPOFOL;  Surgeon: Lollie Sails, MD;  Location: [facility] ENDOSCOPY;  Service: Endoscopy;  Laterality: N/A;  . ESOPHAGOGASTRODUODENOSCOPY (EGD) WITH PROPOFOL N/A 01/30/2018   Procedure: ESOPHAGOGASTRODUODENOSCOPY (EGD) WITH PROPOFOL;  Surgeon: Toledo, Benay Pike, MD;  Location: [facility] ENDOSCOPY;  Service: Gastroenterology;  Laterality: N/A;  . ESOPHAGOGASTRODUODENOSCOPY (EGD) WITH PROPOFOL N/A 02/22/2018   Procedure: ESOPHAGOGASTRODUODENOSCOPY (EGD) WITH PROPOFOL;  Surgeon: Jonathon Bellows, MD;  Location: [facility] ENDOSCOPY;  Service: Gastroenterology;  Laterality: N/A;  . EXCISIONAL HEMORRHOIDECTOMY    . KYPHOPLASTY N/A 03/20/2018   Procedure: [id number];  Surgeon: Hessie Knows, MD;  Location: [facility] ORS;  Service: Orthopedics;  Laterality: N/A;  . PACEMAKER IMPLANT N/A 10/26/2020   Procedure: PACEMAKER IMPLANT;  Surgeon: Isaias Cowman, MD;  Location: [place] CV LAB;  Service: Cardiovascular;  Laterality: N/A;   Social History   Socioeconomic History  . Marital status: Married    Spouse name: Not on file  . Number of children: Not on file  . Years of education: Not on file  . Highest education level: Not on file  Occupational History  . Not on file  Tobacco Use  . Smoking status: Former Smoker    Quit date: 07/10/2011    Years since quitting: 9.3  . Smokeless tobacco: Never Used  Vaping Use  . Vaping Use: Never used  Substance and Sexual Activity  . Alcohol use: No  . Drug use: No  . Sexual activity: Not on file  Other Topics Concern  . Not on file  Social History Narrative  . Not on file   Social Determinants of Health  Financial Resource Strain: Not on file  Food Insecurity: Not on file  Transportation Needs: Not on file  Physical Activity: Not on file  Stress: Not on file  Social Connections: Not on file   Family History  Problem Relation Age of Onset   . Kidney cancer Son   . Bladder Cancer Neg Hx    No Known Allergies Prior to Admission medications   Medication Sig Start Date End Date Taking? Authorizing Provider  aspirin 81 MG EC tablet Take 81 mg by mouth daily.    Yes [provider]  clobetasol (TEMOVATE) 0.05 % external solution Apply 1 application topically every evening. (apply to scalp) 01/25/18  Yes [provider]  Ergocalciferol (VITAMIN D2) 2000 units TABS Take 1 tablet by mouth daily.   Yes [provider]  ferrous fumarate (HEMOCYTE - 106 MG FE) 325 (106 Fe) MG TABS tablet Take 1 tablet (106 mg of iron total) by mouth 2 (two) times daily. 01/31/18  Yes Sudini, Alveta Heimlich, MD  glipiZIDE (GLUCOTROL) 10 MG tablet Take 10 mg by mouth daily.    Yes [provider]  hydrOXYzine (ATARAX/VISTARIL) 10 MG tablet Take 10 mg by mouth at bedtime. 11/11/17  Yes [provider]  insulin glargine (LANTUS) 100 UNIT/ML Solostar Pen Inject 34 Units into the skin daily. (may take up to 50u based upon blood glucose reading)   Yes [provider]  ketoconazole (NIZORAL) 2 % cream  03/11/18  Yes [provider]  ketoconazole (NIZORAL) 2 % shampoo Apply 1 application topically daily. (apply to scalp and wait at least 30 minutes before rinsing) 01/25/18  Yes [provider]  losartan (COZAAR) 50 MG tablet TAKE 1/2 TABLET (25MG) BY MOUTH EVERY DAY 01/16/19  Yes [provider]  Magnesium Oxide 400 (240 Mg) MG TABS TAKE 2 TABLETS (800 MG TOTAL) BY MOUTH DAILY. 03/28/18  Yes [provider]  metFORMIN (GLUCOPHAGE-XR) 500 MG 24 hr tablet  09/18/19  Yes [provider]  niacin (NIASPAN) 500 MG CR tablet Take 500 mg by mouth every evening. 12/25/17  Yes [provider]  Omega-3 Fatty Acids (FISH OIL) 1000 MG CAPS Take 1 capsule by mouth.    Yes [provider]  simvastatin (ZOCOR) 20 MG tablet Take 20 mg by mouth at bedtime. 12/23/17  Yes [provider]  sitaGLIPtin (JANUVIA) 50 MG tablet Take 50 mg by mouth daily.   Yes [provider]  TRAVATAN Z 0.004 % SOLN ophthalmic solution Place 1 drop into the left eye at bedtime. 12/05/17  Yes [provider]  vitamin B-12 (CYANOCOBALAMIN) 1000 MCG tablet Take 1,000 mcg by mouth daily.   Yes [provider]  ACCU-CHEK AVIVA PLUS test strip  03/28/18   [provider]  ACCU-CHEK SOFTCLIX LANCETS lancets once daily Use as instructed. 04/14/18   [provider]  ACCU-CHEK SOFTCLIX LANCETS lancets ONCE DAILY USE AS INSTRUCTED. 04/23/18   [provider]  alendronate (FOSAMAX) 70 MG tablet TAKE 1 TABLET BY MOUTH EVERY 7DAYS WITH A FULL GLASS OF WATER. DO NOT LIE DOWN FOR 30 MINUTES 04/14/18   [provider]  BD AUTOSHIELD DUO 30G X 5 MM [place]  03/11/18   [provider]  BD PEN NEEDLE NANO U/F 32G X 4 MM MISC  03/24/18   [provider]  clindamycin (CLEOCIN T) 1 % external solution Apply 1 application topically 2 (two) times daily. Patient not taking: No sig reported 12/31/17   [provider]  colchicine 0.6 MG tablet Take 2 tabs PO x 1, then 1 tab PO 1 hour later x 1  Max: 1.8 mg total dose per attack, do not repeat within 3 days. Patient not taking: No sig reported 05/04/18   Menshew, Dannielle Karvonen, PA-C  feeding supplement, ENSURE ENLIVE, (ENSURE ENLIVE) LIQD Take 237 mLs by mouth 2 (two) times daily between meals. 02/26/18   Fritzi Mandes, MD  Insulin Pen Needle 32G X 4 MM MISC USE 1 NEEDLE SUBCUTANEOUSLY ONCE A DAY AS DIRECTED 03/24/18   [provider]  Lidocaine 4 % PTCH Place onto the skin. Patient not taking: No sig reported 03/12/18   [provider]  loratadine (CLARITIN) 10 MG tablet Take 10 mg by mouth daily. Patient not taking: No sig reported    [provider]  methocarbamol (ROBAXIN) 500 MG tablet Take 500-1,500 mg by mouth See admin instructions. On day 1-3 take 1500 mg  by mouth three times daily, on days 4-6 take 1000 mg by mouth three times daily, then take 1000 mg by mouth three times daily as needed. Patient not taking: No sig reported 03/12/18   [provider]  pantoprazole (PROTONIX) 40 MG tablet Take 1 tablet (40 mg total) by mouth daily. Patient not taking: No sig reported 02/26/18   Fritzi Mandes, MD  potassium chloride SA (K-DUR,KLOR-CON) 20 MEQ tablet Take 20 mEq by mouth 2 (two) times daily. Patient not taking: No sig reported 03/11/18   [provider]  traMADol (ULTRAM) 50 MG tablet Take by mouth. Patient not taking: No sig reported    [provider]  traMADol (ULTRAM) 50 MG tablet Take 1 tablet (50 mg total) by mouth 2 (two) times daily. Patient not taking: No sig reported 05/04/18   Menshew, Dannielle Karvonen, PA-C   DG Ankle 2 Views Right  Result Date: 10/26/2020 CLINICAL DATA:  Right ankle pain and bruising after a fall. EXAM: RIGHT ANKLE - 2 VIEW COMPARISON:  None. FINDINGS: Two-view exam shows no dislocation. Lateral film suggests the presence of a cortical step-off along the posterior cortex of the distal fibula, projecting through the tibia. No associated subluxation. IMPRESSION: Lateral film suggests a nondisplaced fracture of the distal fibula. Correlation for point tenderness in this region recommended. Dedicated three-view exam or CT imaging could be used to further evaluate as clinically warranted. Electronically Signed   By: Misty Stanley M.D.   On: 10/26/2020 16:11   EP PPM/ICD IMPLANT  Result Date: 10/26/2020 Successful dual-chamber pacemaker implantation  DG Chest Port 1 View  Result Date: 10/25/2020 CLINICAL DATA:  Known heart failure history EXAM: PORTABLE CHEST 1 VIEW COMPARISON:  10/24/2020 FINDINGS: Cardiac shadow remains enlarged. Aortic calcifications are again seen. Slight increase in the degree of vascular congestion is noted when compare with the previous day. No significant interstitial edema is noted  at this time. No focal infiltrate is seen. No bony abnormality is noted. IMPRESSION: Mild vascular congestion without significant edema. Electronically Signed   By: Inez Catalina M.D.   On: 10/25/2020 18:46   ECHOCARDIOGRAM COMPLETE  Result Date: 10/26/2020    ECHOCARDIOGRAM REPORT   Patient Name:   Emily Faulkner Date of Exam: 10/26/2020 Medical Rec #:  [id number]      Height:       57.0 in Accession #:    [id number]     Weight:       193.0 lb Date of Birth:  01-05-1941  BSA:          1.771 m Patient Age:    32 years       BP:           122/46 mmHg Patient Gender: F              HR:           40 bpm. Exam Location:  [facility] Procedure: 2D Echo, Color Doppler and Cardiac Doppler Indications:     I50.31 CHF-Acute Diastolic  History:         Patient has no prior history of Echocardiogram examinations.                  CKD; Risk Factors:Hypertension, Diabetes and Dyslipidemia.  Sonographer:     Charmayne Sheer RDCS (AE) Referring Phys:  [id number] Sharen Hones Diagnosing Phys: Bartholome Bill MD  Sonographer Comments: Suboptimal subcostal window. Global longitudinal strain was attempted. IMPRESSIONS  1. Left ventricular ejection fraction, by estimation, is 65 to 70%. The left ventricle has normal function. The left ventricle has no regional wall motion abnormalities. Left ventricular diastolic parameters were normal.  2. Right ventricular systolic function is normal. The right ventricular size is normal.  3. Left atrial size was mildly dilated.  4. Right atrial size was mildly dilated.  5. The mitral valve is degenerative. Trivial mitral valve regurgitation.  6. Tricuspid valve regurgitation is moderate.  7. The aortic valve was not well visualized. Aortic valve regurgitation is trivial. Mild to moderate aortic valve sclerosis/calcification is present, without any evidence of aortic stenosis. FINDINGS  Left Ventricle: Left ventricular ejection fraction, by estimation, is 65 to 70%. The left ventricle has normal function. The left  ventricle has no regional wall motion abnormalities. The left ventricular internal cavity size was normal in size. There is  borderline left ventricular hypertrophy. Left ventricular diastolic parameters were normal. Right Ventricle: The right ventricular size is normal. No increase in right ventricular wall thickness. Right ventricular systolic function is normal. Left Atrium: Left atrial size was mildly dilated. Right Atrium: Right atrial size was mildly dilated. Pericardium: There is no evidence of pericardial effusion. Mitral Valve: The mitral valve is degenerative in appearance. Trivial mitral valve regurgitation. Tricuspid Valve: The tricuspid valve is not well visualized. Tricuspid valve regurgitation is moderate. Aortic Valve: The aortic valve was not well visualized. Aortic valve regurgitation is trivial. Mild to moderate aortic valve sclerosis/calcification is present, without any evidence of aortic stenosis. Pulmonic Valve: The pulmonic valve was not well visualized. Pulmonic valve regurgitation is trivial. Aorta: The aortic root is normal in size and structure. IAS/Shunts: The interatrial septum was not well visualized. Bartholome Bill MD Electronically signed by Bartholome Bill MD Signature Date/Time: 10/26/2020/12:02:35 PM    Final     Positive ROS: All other systems have been reviewed and were otherwise negative with the exception of those mentioned in the HPI and as above.  Physical Exam: General:  Alert, no acute distress Psychiatric:  Patient is competent for consent with normal mood and affect   Cardiovascular:  No pedal edema Respiratory:  No wheezing, non-labored breathing GI:  Abdomen is soft and non-tender Skin:  No lesions in the area of chief complaint Neurologic:  Sensation intact distally Lymphatic:  No axillary or cervical lymphadenopathy  Orthopedic Exam:  Orthopedic examination is limited to the right foot and ankle.  There is moderate swelling and maturing ecchymosis over the  anterolateral and lateral aspects of her lower leg, ankle and foot.  No erythema, abrasions, or other skin abnormalities are identified.  She has moderate tenderness to palpation over the distal fibula laterally, but has no tenderness to palpation over the anterior or medial aspects of the ankle.  She is neurovascularly intact to her right foot, demonstrating the ability to dorsiflex and plantarflex her toes.  Ankle motion is limited secondary to pain and apprehension.  Sensations intact to light touch to all distributions of her right foot.  She has good capillary refill to her right foot.  X-rays:  Recent x-rays of her right ankle are available for review and have been reviewed by myself.  These films demonstrate an essentially nondisplaced oblique fracture through the posterior portion of her distal fibula.  Medially, there is a small bony fleck off the distal tip of her medial malleolus which appears to be old.  There does not appear to be any other acute bony abnormalities in the foot or ankle region.  Her mortise is anatomic.  No significant degenerative changes are identified.  Assessment: Closed nondisplaced isolated right distal fibular fracture.  Plan: The treatment Options have been discussed with the patient.  The patient is pleased to hear that this fracture can be managed nonsurgically.  The patient will be placed into a cam walker boot.  She may weight-bear as tolerated in the cam walker boot, using whatever assistive device is felt to be appropriate.  Most likely, the boot will need to be maintained for at least 1 month and possibly 6 weeks.  I will ask physical therapy to get involved with helping to mobilize her.  Thank you for asking me to participate in the care of this most pleasant yet unfortunate woman.  I will be happy to follow her with you.   Pascal Lux, MD  Beeper #:  [phone]  10/27/2020 3:17 PM

## 2020-10-27 NOTE — Progress Notes (Signed)
Spoke to CT who requested pt drink contrast for abdominal scan. Pt requested to have scan in morning because of time and pt unsure if she could drink entire contrast this time. Called CT back and informed of patient request for scan in morning. CT stated that would be ok, if she wants to drink it at [facility], call CT and give them heads up so CT can time the scan right.

## 2020-10-27 NOTE — Progress Notes (Signed)
PROGRESS NOTE    Emily Faulkner  VQQ:[id number] DOB: Aug 19, 1940 DOA: 10/24/2020 PCP: Sofie Hartigan, MD   Chief complaint.  Generalized weakness. Brief Narrative:  Emily Faulkner a 80 y.o.femalewith medical history significant fordiabetes mellitus with complications of stage III chronic kidney disease, hypertension, pulmonary hypertension and dyslipidemia who presents to the emergency room by EMS for evaluation of a syncopal episode. Patient also has been having poor appetite, nausea vomiting and frequent diarrhea for the last few days. Upon arriving the hospital, C. difficile toxin was positive. Was also found to have third-degree AV block while in the ER, improved after giving fluids. Patient also has acute kidney injury due to dehydration. Pacemaker placed on 4/20.   Assessment & Plan:   Principal Problem:   Syncope and collapse Active Problems:   Hypertension   AKI (acute kidney injury) ([place])   Complete heart block (HCC)   Hyperkalemia   Odynophagia  #1. Syncope and collapse. Transient third-degree AV block. Patient had a pacemaker placed on 4/20.  Doing better.  #2. Acute kidney injury on chronic kidney disease stage IIIb secondary to dehydration. Anion gap metabolic acidosis. Hyperkalemia C. difficile colitis. Hypoxia. Patient condition improving, renal function is better today.  She has a better appetite.  Her oxygenation is also better, she no longer has any hypoxemia. She still has diarrhea, but no nausea vomiting.  We will continue oral vancomycin for C. difficile colitis.  3.  Type 2 diabetes. Continue current regimen.  4.  Anemia of chronic disease. Mild thrombocytopenia. Adequate iron and B12 level. Will discontinue heparin.  DVT prophylaxis: SCDs Code Status: Full Family Communication:  Disposition Plan:  .   Status is: Inpatient  Remains inpatient appropriate because:Inpatient level of care appropriate due to severity of  illness   Dispo: The patient is from: Home              Anticipated d/c is to: Home              Patient currently is not medically stable to d/c.   Difficult to place patient No        I/O last 3 completed shifts: In: 1012.8 [P.O.:480; I.V.:73.9; IV Piggyback:458.9] Out: 240 [Urine:240] No intake/output data recorded.     Consultants:   Nephrology and cardiology.  Procedures: Pacemaker  Antimicrobials: Oral vancomycin.  Subjective: Patient feels much improved today.  Currently, patient has good appetite, no nausea vomiting.  He still has multiple loose stools a day.  No abdominal pain. She no longer has any shortness of breath, will try to wean off oxygen today. No cough.  No chest pain. No dysuria hematuria. No fever or chills.  Objective: Vitals:   10/26/20 2052 10/27/20 0501 10/27/20 0910 10/27/20 1011  BP: (!) 135/45 (!) 128/48 (!) 155/48 (!) 135/55  Pulse: 80 72 82 82  Resp: 18 20 18 18   Temp: 98.9 F (37.2 C) 97.6 F (36.4 C) 97.6 F (36.4 C) 98.3 F (36.8 C)  TempSrc: Oral     SpO2: 99% 100% 100% 100%  Weight:  88 kg    Height:        Intake/Output Summary (Last 24 hours) at 10/27/2020 1041 Last data filed at 10/27/2020 0300 Gross per 24 hour  Intake 1009.77 ml  Output 100 ml  Net 909.77 ml   Filed Weights   10/26/20 0729 10/26/20 1115 10/27/20 0501  Weight: 88.2 kg 83.9 kg 88 kg    Examination:  General exam: Appears calm and  comfortable  Respiratory system: Clear to auscultation. Respiratory effort normal. Cardiovascular system: S1 & S2 heard, RRR. No JVD, murmurs, rubs, gallops or clicks. No pedal edema. Gastrointestinal system: Abdomen is nondistended, soft and nontender. No organomegaly or masses felt. Normal bowel sounds heard. Central nervous system: Alert and oriented x3. No focal neurological deficits. Extremities: Symmetric 5 x 5 power. Skin: No rashes, lesions or ulcers Psychiatry: Judgement and insight appear normal. Mood &  affect appropriate.     Data Reviewed: I have personally reviewed following labs and imaging studies  CBC: Recent Labs  Lab 10/24/20 1130 10/25/20 0518 10/26/20 0620 10/27/20 0502  WBC 9.3 9.1 7.3 6.9  NEUTROABS 7.3  --  4.7 4.8  HGB 8.8* 7.9* 8.0* 8.5*  HCT 29.3* 25.2* 26.2* 27.0*  MCV 105.4* 100.8* 102.3* 101.9*  PLT 205 204 161 403*   Basic Metabolic Panel: Recent Labs  Lab 10/24/20 1130 10/25/20 0518 10/26/20 0508 10/27/20 0502  NA 136 137 139 138  K 5.2* 4.5 4.5 3.9  CL 107 105 105 105  CO2 15* 20* 20* 22  GLUCOSE 169* 135* 114* 147*  BUN 52* 53* 58* 59*  CREATININE 3.79* 3.24* 3.57* 2.96*  CALCIUM 9.1 8.5* 8.2* 8.4*  MG 2.8*  --  2.7* 2.7*  PHOS  --   --  4.8*  --    GFR: Estimated Creatinine Clearance: 16.5 mL/min (A) (by C-G formula based on SCr of 2.96 mg/dL (H)). Liver Function Tests: Recent Labs  Lab 10/24/20 1130  AST 30  ALT 13  ALKPHOS 55  BILITOT 1.0  PROT 6.9  ALBUMIN 3.6   No results for input(s): LIPASE, AMYLASE in the last 168 hours. No results for input(s): AMMONIA in the last 168 hours. Coagulation Profile: No results for input(s): INR, PROTIME in the last 168 hours. Cardiac Enzymes: Recent Labs  Lab 10/25/20 0518  CKTOTAL 472*   BNP (last 3 results) No results for input(s): PROBNP in the last 8760 hours. HbA1C: Recent Labs    10/25/20 0518  HGBA1C 5.3   CBG: Recent Labs  Lab 10/26/20 1136 10/26/20 1441 10/26/20 1707 10/26/20 2128 10/27/20 0839  GLUCAP 138* 125* 137* 203* 140*   Lipid Profile: No results for input(s): CHOL, HDL, LDLCALC, TRIG, CHOLHDL, LDLDIRECT in the last 72 hours. Thyroid Function Tests: No results for input(s): TSH, T4TOTAL, FREET4, T3FREE, THYROIDAB in the last 72 hours. Anemia Panel: Recent Labs    10/24/20 1130 10/25/20 0518  VITAMINB12 2,884*  --   TIBC  --  417  IRON  --  171*   Sepsis Labs: No results for input(s): PROCALCITON, LATICACIDVEN in the last 168 hours.  Recent  Results (from the past 240 hour(s))  Resp Panel by RT-PCR (Flu A&B, Covid) Nasopharyngeal Swab     Status: None   Collection Time: 10/24/20 11:24 AM   Specimen: Nasopharyngeal Swab; Nasopharyngeal(NP) swabs in vial transport medium  Result Value Ref Range Status   SARS Coronavirus 2 by RT PCR NEGATIVE NEGATIVE Final    Comment: (NOTE) SARS-CoV-2 target nucleic acids are NOT DETECTED.  The SARS-CoV-2 RNA is generally detectable in upper respiratory specimens during the acute phase of infection. The lowest concentration of SARS-CoV-2 viral copies this assay can detect is 138 copies/mL. A negative result does not preclude SARS-Cov-2 infection and should not be used as the sole basis for treatment or other patient management decisions. A negative result may occur with  improper specimen collection/handling, submission of specimen other than nasopharyngeal swab, presence of  viral mutation(s) within the areas targeted by this assay, and inadequate number of viral copies(<138 copies/mL). A negative result must be combined with clinical observations, patient history, and epidemiological information. The expected result is Negative.  Fact Sheet for Patients:  EntrepreneurPulse.com.au  Fact Sheet for Healthcare Providers:  IncredibleEmployment.be  This test is no t yet approved or cleared by the [place] FDA and  has been authorized for detection and/or diagnosis of SARS-CoV-2 by FDA under an Emergency Use Authorization (EUA). This EUA will remain  in effect (meaning this test can be used) for the duration of the COVID-19 declaration under Section 564(b)(1) of the Act, 21 U.S.C.section 360bbb-3(b)(1), unless the authorization is terminated  or revoked sooner.       Influenza A by PCR NEGATIVE NEGATIVE Final   Influenza B by PCR NEGATIVE NEGATIVE Final    Comment: (NOTE) The Xpert Xpress SARS-CoV-2/FLU/RSV plus assay is intended as an aid in the  diagnosis of influenza from Nasopharyngeal swab specimens and should not be used as a sole basis for treatment. Nasal washings and aspirates are unacceptable for Xpert Xpress SARS-CoV-2/FLU/RSV testing.  Fact Sheet for Patients: EntrepreneurPulse.com.au  Fact Sheet for Healthcare Providers: IncredibleEmployment.be  This test is not yet approved or cleared by the [place] FDA and has been authorized for detection and/or diagnosis of SARS-CoV-2 by FDA under an Emergency Use Authorization (EUA). This EUA will remain in effect (meaning this test can be used) for the duration of the COVID-19 declaration under Section 564(b)(1) of the Act, 21 U.S.C. section 360bbb-3(b)(1), unless the authorization is terminated or revoked.  Performed at [facility], [place], [place] [address]   C Difficile Quick Screen w PCR reflex     Status: Abnormal   Collection Time: 10/24/20  4:45 PM   Specimen: STOOL  Result Value Ref Range Status   C Diff antigen POSITIVE (A) NEGATIVE Final   C Diff toxin NEGATIVE NEGATIVE Final   C Diff interpretation Results are indeterminate. See PCR results.  Final    Comment: Performed at [facility], [address] [place], [place] [address]  C. Diff by PCR, Reflexed     Status: Abnormal   Collection Time: 10/24/20  4:45 PM  Result Value Ref Range Status   Toxigenic C. Difficile by PCR POSITIVE (A) NEGATIVE Final    Comment: Positive for toxigenic C. difficile with little to no toxin production. Only treat if clinical presentation suggests symptomatic illness. Performed at [facility], [address] [place], [place] [address]   Surgical PCR screen     Status: Abnormal   Collection Time: 10/26/20  9:52 AM   Specimen: Nasal Mucosa; Nasal Swab  Result Value Ref Range Status   MRSA, PCR NEGATIVE NEGATIVE Final   Staphylococcus aureus POSITIVE (A) NEGATIVE Final    Comment:  (NOTE) The Xpert SA Assay (FDA approved for NASAL specimens in patients 62 years of age and older), is one component of a comprehensive surveillance program. It is not intended to diagnose infection nor to guide or monitor treatment. Performed at [facility], [address] [place], [place] [address]          Radiology Studies: DG Ankle 2 Views Right  Result Date: 10/26/2020 CLINICAL DATA:  Right ankle pain and bruising after a fall. EXAM: RIGHT ANKLE - 2 VIEW COMPARISON:  None. FINDINGS: Two-view exam shows no dislocation. Lateral film suggests the presence of a cortical step-off along the posterior cortex of  the distal fibula, projecting through the tibia. No associated subluxation. IMPRESSION: Lateral film suggests a nondisplaced fracture of the distal fibula. Correlation for point tenderness in this region recommended. Dedicated three-view exam or CT imaging could be used to further evaluate as clinically warranted. Electronically Signed   By: Misty Stanley M.D.   On: 10/26/2020 16:11   EP PPM/ICD IMPLANT  Result Date: 10/26/2020 Successful dual-chamber pacemaker implantation  DG Chest Port 1 View  Result Date: 10/25/2020 CLINICAL DATA:  Known heart failure history EXAM: PORTABLE CHEST 1 VIEW COMPARISON:  10/24/2020 FINDINGS: Cardiac shadow remains enlarged. Aortic calcifications are again seen. Slight increase in the degree of vascular congestion is noted when compare with the previous day. No significant interstitial edema is noted at this time. No focal infiltrate is seen. No bony abnormality is noted. IMPRESSION: Mild vascular congestion without significant edema. Electronically Signed   By: Inez Catalina M.D.   On: 10/25/2020 18:46   ECHOCARDIOGRAM COMPLETE  Result Date: 10/26/2020    ECHOCARDIOGRAM REPORT   Patient Name:   Emily Faulkner Date of Exam: 10/26/2020 Medical Rec #:  [id number]      Height:       57.0 in Accession #:    [id number]     Weight:       193.0 lb  Date of Birth:  1941/02/14      BSA:          1.771 m Patient Age:    66 years       BP:           122/46 mmHg Patient Gender: F              HR:           40 bpm. Exam Location:  [facility] Procedure: 2D Echo, Color Doppler and Cardiac Doppler Indications:     I50.31 CHF-Acute Diastolic  History:         Patient has no prior history of Echocardiogram examinations.                  CKD; Risk Factors:Hypertension, Diabetes and Dyslipidemia.  Sonographer:     Charmayne Sheer RDCS (AE) Referring Phys:  [id number] Sharen Hones Diagnosing Phys: Bartholome Bill MD  Sonographer Comments: Suboptimal subcostal window. Global longitudinal strain was attempted. IMPRESSIONS  1. Left ventricular ejection fraction, by estimation, is 65 to 70%. The left ventricle has normal function. The left ventricle has no regional wall motion abnormalities. Left ventricular diastolic parameters were normal.  2. Right ventricular systolic function is normal. The right ventricular size is normal.  3. Left atrial size was mildly dilated.  4. Right atrial size was mildly dilated.  5. The mitral valve is degenerative. Trivial mitral valve regurgitation.  6. Tricuspid valve regurgitation is moderate.  7. The aortic valve was not well visualized. Aortic valve regurgitation is trivial. Mild to moderate aortic valve sclerosis/calcification is present, without any evidence of aortic stenosis. FINDINGS  Left Ventricle: Left ventricular ejection fraction, by estimation, is 65 to 70%. The left ventricle has normal function. The left ventricle has no regional wall motion abnormalities. The left ventricular internal cavity size was normal in size. There is  borderline left ventricular hypertrophy. Left ventricular diastolic parameters were normal. Right Ventricle: The right ventricular size is normal. No increase in right ventricular wall thickness. Right ventricular systolic function is normal. Left Atrium: Left atrial size was mildly dilated. Right Atrium: Right atrial size  was mildly dilated. Pericardium: There is  no evidence of pericardial effusion. Mitral Valve: The mitral valve is degenerative in appearance. Trivial mitral valve regurgitation. Tricuspid Valve: The tricuspid valve is not well visualized. Tricuspid valve regurgitation is moderate. Aortic Valve: The aortic valve was not well visualized. Aortic valve regurgitation is trivial. Mild to moderate aortic valve sclerosis/calcification is present, without any evidence of aortic stenosis. Pulmonic Valve: The pulmonic valve was not well visualized. Pulmonic valve regurgitation is trivial. Aorta: The aortic root is normal in size and structure. IAS/Shunts: The interatrial septum was not well visualized. Bartholome Bill MD Electronically signed by Bartholome Bill MD Signature Date/Time: 10/26/2020/12:02:35 PM    Final         Scheduled Meds: . aspirin EC  81 mg Oral Daily  . Chlorhexidine Gluconate Cloth  6 each Topical Q0600  . cholecalciferol  2,000 Units Oral Daily  . feeding supplement  237 mL Oral BID BM  . Ferrous Fumarate  1 tablet Oral BID  . heparin  5,000 Units Subcutaneous Q8H  . hydrOXYzine  10 mg Oral QHS  . insulin aspart  0-15 Units Subcutaneous TID WC  . latanoprost  1 drop Left Eye QHS  . loratadine  10 mg Oral Daily  . magnesium oxide  800 mg Oral Daily  . niacin  500 mg Oral QHS  . omega-3 acid ethyl esters  1 g Oral Daily  . pantoprazole  40 mg Oral Daily  . sodium bicarbonate  650 mg Oral TID  . sodium chloride flush  3 mL Intravenous Q12H  . vancomycin  125 mg Oral QID  . vitamin B-12  1,000 mcg Oral Daily   Continuous Infusions: . metronidazole 500 mg (10/27/20 0628)     LOS: 2 days    Time spent: 28 minutes    Sharen Hones, MD [facility]   To contact the attending provider between 7A-7P or the covering provider during after hours 7P-7A, please log into the web site [email or web address] and access using universal [place] password for that web site. If you do not have  the password, please call the hospital operator.  10/27/2020, 10:41 AM

## 2020-10-27 NOTE — Plan of Care (Signed)

## 2020-10-28 DIAGNOSIS — S82831A Other fracture of upper and lower end of right fibula, initial encounter for closed fracture: Secondary | ICD-10-CM | POA: Insufficient documentation

## 2020-10-28 LAB — CBC WITH DIFFERENTIAL/PLATELET
Abs Immature Granulocytes: 0.05 10*3/uL (ref 0.00–0.07)
Basophils Absolute: 0.1 10*3/uL (ref 0.0–0.1)
Basophils Relative: 1 %
Eosinophils Absolute: 0.4 10*3/uL (ref 0.0–0.5)
Eosinophils Relative: 5 %
HCT: 25.7 % — ABNORMAL LOW (ref 36.0–46.0)
Hemoglobin: 7.9 g/dL — ABNORMAL LOW (ref 12.0–15.0)
Immature Granulocytes: 1 %
Lymphocytes Relative: 11 %
Lymphs Abs: 0.7 10*3/uL (ref 0.7–4.0)
MCH: 31.3 pg (ref 26.0–34.0)
MCHC: 30.7 g/dL (ref 30.0–36.0)
MCV: 102 fL — ABNORMAL HIGH (ref 80.0–100.0)
Monocytes Absolute: 0.6 10*3/uL (ref 0.1–1.0)
Monocytes Relative: 9 %
Neutro Abs: 5 10*3/uL (ref 1.7–7.7)
Neutrophils Relative %: 73 %
Platelets: 144 10*3/uL — ABNORMAL LOW (ref 150–400)
RBC: 2.52 MIL/uL — ABNORMAL LOW (ref 3.87–5.11)
RDW: 16.4 % — ABNORMAL HIGH (ref 11.5–15.5)
WBC: 6.8 10*3/uL (ref 4.0–10.5)
nRBC: 0 % (ref 0.0–0.2)

## 2020-10-28 LAB — GLUCOSE, CAPILLARY
Glucose-Capillary: 154 mg/dL — ABNORMAL HIGH (ref 70–99)
Glucose-Capillary: 174 mg/dL — ABNORMAL HIGH (ref 70–99)
Glucose-Capillary: 238 mg/dL — ABNORMAL HIGH (ref 70–99)
Glucose-Capillary: 242 mg/dL — ABNORMAL HIGH (ref 70–99)
Glucose-Capillary: 219 mg/dL — ABNORMAL HIGH (ref 70–99)

## 2020-10-28 LAB — BASIC METABOLIC PANEL
Anion gap: 9 (ref 5–15)
BUN: 50 mg/dL — ABNORMAL HIGH (ref 8–23)
CO2: 22 mmol/L (ref 22–32)
Calcium: 8.8 mg/dL — ABNORMAL LOW (ref 8.9–10.3)
Chloride: 108 mmol/L (ref 98–111)
Creatinine, Ser: 2.13 mg/dL — ABNORMAL HIGH (ref 0.44–1.00)
GFR, Estimated: 23 mL/min — ABNORMAL LOW (ref >60)
Glucose, Bld: 166 mg/dL — ABNORMAL HIGH (ref 70–99)
Potassium: 3.8 mmol/L (ref 3.5–5.1)
Sodium: 139 mmol/L (ref 135–145)

## 2020-10-28 LAB — PHOSPHORUS: Phosphorus: 3.8 mg/dL (ref 2.5–4.6)

## 2020-10-28 LAB — MAGNESIUM: Magnesium: 2.5 mg/dL — ABNORMAL HIGH (ref 1.7–2.4)

## 2020-10-28 MED ORDER — HYDROCORTISONE 1 % EX CREA
TOPICAL_CREAM | Freq: Four times a day (QID) | CUTANEOUS | Status: DC
  Administered 2020-10-29: 1 via TOPICAL
  Filled 2020-10-28: qty 28

## 2020-10-28 NOTE — Progress Notes (Signed)
[facility] Cardiology    SUBJECTIVE: Patient states to be doing reasonably well sitting up in a chair right ankle in a boot denies any chest pain no lightheaded dizziness pacer site well bandaged no tenderness no bleeding no soreness.  Patient states she hopes to go home by tomorrow she denies any further diarrhea   Vitals:   10/27/20 2039 10/28/20 0318 10/28/20 0551 10/28/20 1249  BP: (!) 127/38 (!) 150/69 133/67 (!) 147/60  Pulse: 83 88 83 84  Resp: 18 20 18    Temp: 98.7 F (37.1 C) 98.4 F (36.9 C) 98.3 F (36.8 C) 99.4 F (37.4 C)  TempSrc: Oral Oral  Oral  SpO2: 94% 95% 94% 97%  Weight:      Height:         Intake/Output Summary (Last 24 hours) at 10/28/2020 1446 Last data filed at 10/27/2020 1834 Gross per 24 hour  Intake --  Output 0 ml  Net 0 ml      PHYSICAL EXAM  General: Well developed, well nourished, in no acute distress HEENT:  Normocephalic and atramatic Neck:  No JVD.  Lungs: Clear bilaterally to auscultation and percussion. Heart: HRRR . Normal S1 and S2 without gallops or murmurs.  Abdomen: Bowel sounds are positive, abdomen soft and non-tender  Msk:  Back normal, normal gait. Normal strength and tone for age. Extremities: No clubbing, cyanosis or edema.   Neuro: Alert and oriented X 3. Psych:  Good affect, responds appropriately   LABS: Basic Metabolic Panel: Recent Labs    10/26/20 0508 10/27/20 0502 10/28/20 0422  NA 139 138 139  K 4.5 3.9 3.8  CL 105 105 108  CO2 20* 22 22  GLUCOSE 114* 147* 166*  BUN 58* 59* 50*  CREATININE 3.57* 2.96* 2.13*  CALCIUM 8.2* 8.4* 8.8*  MG 2.7* 2.7* 2.5*  PHOS 4.8*  --  3.8   Liver Function Tests: No results for input(s): AST, ALT, ALKPHOS, BILITOT, PROT, ALBUMIN in the last 72 hours. No results for input(s): LIPASE, AMYLASE in the last 72 hours. CBC: Recent Labs    10/27/20 0502 10/28/20 0422  WBC 6.9 6.8  NEUTROABS 4.8 5.0  HGB 8.5* 7.9*  HCT 27.0* 25.7*  MCV 101.9* 102.0*  PLT 144* 144*    Cardiac Enzymes: No results for input(s): CKTOTAL, CKMB, CKMBINDEX, TROPONINI in the last 72 hours. BNP: Invalid input(s): POCBNP D-Dimer: No results for input(s): DDIMER in the last 72 hours. Hemoglobin A1C: No results for input(s): HGBA1C in the last 72 hours. Fasting Lipid Panel: No results for input(s): CHOL, HDL, LDLCALC, TRIG, CHOLHDL, LDLDIRECT in the last 72 hours. Thyroid Function Tests: No results for input(s): TSH, T4TOTAL, T3FREE, THYROIDAB in the last 72 hours.  Invalid input(s): FREET3 Anemia Panel: No results for input(s): VITAMINB12, FOLATE, FERRITIN, TIBC, IRON, RETICCTPCT in the last 72 hours.  CT ABDOMEN PELVIS WO CONTRAST  Result Date: 10/27/2020 CLINICAL DATA:  Follow-up left renal lesions seen on recent ultrasound. EXAM: CT ABDOMEN AND PELVIS WITHOUT CONTRAST TECHNIQUE: Multidetector CT imaging of the abdomen and pelvis was performed following the standard protocol without IV contrast. COMPARISON:  03/17/2018 FINDINGS: Examination is limited by respiratory motion and artifact from the arms being down by the side. Lower chest: Stable emphysematous changes and pulmonary scarring. Stable cardiac enlargement and advanced aortic and coronary artery calcifications. Pacer wires are noted. Hepatobiliary: Stable advanced cirrhotic changes involving the liver without obvious hepatic lesions without contrast. No intra or extrahepatic biliary dilatation. Pancreas: No mass, inflammation or ductal  dilatation. Spleen: Mild stable splenomegaly. Adrenals/Urinary Tract: The adrenal glands are unremarkable and stable. There is a solid-appearing 2.2 cm lesion associated with the lateral cortex of the left kidney in the interpolar region. This measures 32 Hounsfield units. On the prior study from 2019 it measured approximately 16.5 mm. No worrisome right-sided renal mass. Stable prominent extra renal pelvis. No renal or obstructing ureteral calculi. No bladder calculi or bladder mass.  Stomach/Bowel: The stomach, duodenum, small bowel and colon are grossly normal. Vascular/Lymphatic: Advanced atherosclerotic calcifications involving the aorta and branch vessels but no aneurysm. No mesenteric or retroperitoneal mass or adenopathy. Reproductive: The uterus and ovaries are unremarkable. Other: Moderate free abdominal/free pelvic fluid likely secondary to the patient's cirrhosis. Musculoskeletal: No significant bony findings. T12 vertebral augmentation changes are noted. IMPRESSION: 1. Stable advanced cirrhotic changes involving the liver with associated mild splenomegaly and moderate abdominal/free pelvic fluid. No focal hepatic lesions are identified without contrast. 2. Slight interval enlargement of the solid-appearing left renal lesion. 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 Recommend continued surveillance. 3. No acute abdominal/pelvic findings or adenopathy. 4. Advanced atherosclerotic calcifications involving the aorta and branch vessels. 5. Stable emphysematous changes and pulmonary scarring at the lung bases. 6. Emphysema and aortic atherosclerosis. Aortic Atherosclerosis (ICD10-I70.0) and Emphysema (ICD10-J43.9). Electronically Signed   By: Marijo Sanes M.D.   On: 10/27/2020 15:38   DG Ankle 2 Views Right  Result Date: 10/26/2020 CLINICAL DATA:  Right ankle pain and bruising after a fall. EXAM: RIGHT ANKLE - 2 VIEW COMPARISON:  None. FINDINGS: Two-view exam shows no dislocation. Lateral film suggests the presence of a cortical step-off along the posterior cortex of the distal fibula, projecting through the tibia. No associated subluxation. IMPRESSION: Lateral film suggests a nondisplaced fracture of the distal fibula. Correlation for point tenderness in this region recommended. Dedicated three-view exam or CT imaging could be used to further evaluate as clinically warranted. Electronically Signed   By: Misty Stanley M.D.   On: 10/26/2020 16:11     Echo  preserved left ventricular function ejection fraction of 65%  TELEMETRY: Placed rhythm interventricular conduction delay related to pacemaker  ASSESSMENT AND PLAN:  Principal Problem:   Syncope and collapse Active Problems:   Hypertension   AKI (acute kidney injury) ([place])   Complete heart block (HCC)   Hyperkalemia   Odynophagia    Plan Status post dual-chamber permanent pacemaker for sick sinus syndrome complete heart block bradycardia Maintain adequate hydration for syncope Continue blood pressure control for hypertension Maintain adequate hydration for renal insufficiency have the patient follow-up with nephrology Appreciate orthopedics input for ankle discomfort and outpatient antibiotic C. difficile on oral vancomycin and Flagyl continue current therapy Hypoxemia improved on room air at this point Patient stable to be discharged home from a cardiac standpoint  Follow-up with cardiology 1 to 2 weeks for pacemaker site evaluation and pacemaker interrogation   Yolonda Kida, MD 10/28/2020 2:46 PM

## 2020-10-28 NOTE — Progress Notes (Signed)
Subjective: The patient has been fitted with the cam walker boot.  She has no new complaints regarding her right ankle, other than the boot being somewhat heavy.   Objective: Vital signs in last 24 hours: Temp:  [97.6 F (36.4 C)-98.7 F (37.1 C)] 98.3 F (36.8 C) (04/22 0551) Pulse Rate:  [82-90] 83 (04/22 0551) Resp:  [17-20] 18 (04/22 0551) BP: (127-155)/(38-69) 133/67 (04/22 0551) SpO2:  [94 %-100 %] 94 % (04/22 0551)  Intake/Output from previous day: No intake/output data recorded. Intake/Output this shift: No intake/output data recorded.  Recent Labs    10/26/20 0620 10/27/20 0502 10/28/20 0422  HGB 8.0* 8.5* 7.9*   Recent Labs    10/27/20 0502 10/28/20 0422  WBC 6.9 6.8  RBC 2.65* 2.52*  HCT 27.0* 25.7*  PLT 144* 144*   Recent Labs    10/27/20 0502 10/28/20 0422  NA 138 139  K 3.9 3.8  CL 105 108  CO2 22 22  BUN 59* 50*  CREATININE 2.96* 2.13*  GLUCOSE 147* 166*  CALCIUM 8.4* 8.8*   No results for input(s): LABPT, INR in the last 72 hours.  Physical Exam: Orthopedic examination again is limited to the right foot and lower leg.  Her examination findings are unchanged as compared to yesterday.  She remains neurovascularly intact to her right foot.  Assessment: Status post nondisplaced right distal fibular fracture.  Plan: The treatment options have been reviewed with the patient.  She is to continue to wear her cam walker boot at all times, removing it only for bathing purposes.  She may begin to weight-bear as tolerated on the right foot in her cam walker boot, using a walker or cane as necessary for balance and support.  An order for physical therapy to come help her with mobilization has been placed into the patient's EMR.  Thank you for asking me to participate in the care of this delightful woman.  I will sign off at this time.  Please make arrangements for her to follow-up in my office in about 4 weeks for repeat x-rays of her right ankle.  Please  reconsult me if further orthopedic issues develop during this hospitalization.   Marshall Cork Dunya Meiners 10/28/2020, 7:57 AM

## 2020-10-28 NOTE — Care Management Important Message (Signed)
Important Message  Patient Details  Name: SYLIVA MEE MRN: [id number] Date of Birth: 1940-09-05   Medicare Important Message Given:  Yes  Reviewed Medicare IM with patient via room phone due to isolation status.  Copy of Medicare IM to be delivered to patient's room via nursing staff.     Dannette Barbara 10/28/2020, 3:28 PM

## 2020-10-28 NOTE — Progress Notes (Signed)
PROGRESS NOTE    Emily Faulkner  TTS:[id number] DOB: 1941-01-20 DOA: 10/24/2020 PCP: Sofie Hartigan, MD   Chief complaint.  Generalized weakness. Brief Narrative:  Emily Faulkner a 80 y.o.femalewith medical history significant fordiabetes mellitus with complications of stage III chronic kidney disease, hypertension, pulmonary hypertension and dyslipidemia who presents to the emergency room by EMS for evaluation of a syncopal episode. Patient also has been having poor appetite, nausea vomiting and frequent diarrhea for the last few days. Upon arriving the hospital, C. difficile toxin was positive. Was also found to have third-degree AV block while in the ER, improved after giving fluids. Patient also hasacute kidney injury due to dehydration. Pacemaker placed on 4/20.   Assessment & Plan:   Principal Problem:   Syncope and collapse Active Problems:   Hypertension   AKI (acute kidney injury) ([place])   Complete heart block ([facility])   Hyperkalemia   Odynophagia  #1. Syncope and collapse. Transient third-degree AV block. Status post pacemaker placement, doing well.  #2. Acute kidney injury on chronic kidney disease stage IIIb secondary to dehydration. Anion gap metabolic acidosis. Hyperkalemia C. difficile colitis. Hypoxia. Hypoxia resolved.  Patient diarrhea is slowing down, continue vancomycin for total course of 10 days. Patient also has a better appetite, she is making urine, renal function improving. She probably can go home tomorrow if renal function continues to improve.  3.  Type 2 diabetes. Continue current regimen.  4.  Anemia of chronic disease. Thrombocytopenia. Stable.  #5 for right ankle fracture. Patient had right fibular fracture to distal area.  No need for surgery.   DVT prophylaxis: SCDs Code Status: Full Family Communication:  Disposition Plan:  .   Status is: Inpatient  Remains inpatient appropriate because:Inpatient level of care  appropriate due to severity of illness   Dispo: The patient is from: Home              Anticipated d/c is to: Home              Patient currently is not medically stable to d/c.   Difficult to place patient No        I/O last 3 completed shifts: In: 343 [P.O.:240; I.V.:3; IV Piggyback:100] Out: 0  No intake/output data recorded.     Consultants:   Nephrology  Procedures: Pacemaker  Antimicrobials: None  Subjective: Patient still has some loose stools, but much improved.  She has good appetite without nausea vomiting. She has no short of breath or chest pain.  No cough. She has no dysuria hematuria  No fever chills No chest pain or palpitation.  Objective: Vitals:   10/27/20 2039 10/28/20 0318 10/28/20 0551 10/28/20 1249  BP: (!) 127/38 (!) 150/69 133/67 (!) 147/60  Pulse: 83 88 83 84  Resp: 18 20 18    Temp: 98.7 F (37.1 C) 98.4 F (36.9 C) 98.3 F (36.8 C) 99.4 F (37.4 C)  TempSrc: Oral Oral  Oral  SpO2: 94% 95% 94% 97%  Weight:      Height:        Intake/Output Summary (Last 24 hours) at 10/28/2020 1302 Last data filed at 10/27/2020 1834 Gross per 24 hour  Intake --  Output 0 ml  Net 0 ml   Filed Weights   10/26/20 0729 10/26/20 1115 10/27/20 0501  Weight: 88.2 kg 83.9 kg 88 kg    Examination:  General exam: Appears calm and comfortable  Respiratory system: Clear to auscultation. Respiratory effort normal. Cardiovascular system: S1 &  S2 heard, RRR. No JVD, murmurs, rubs, gallops or clicks. No pedal edema. Gastrointestinal system: Abdomen is nondistended, soft and nontender. No organomegaly or masses felt. Normal bowel sounds heard. Central nervous system: Alert and oriented. No focal neurological deficits. Extremities: Symmetric 5 x 5 power. Skin: No rashes, lesions or ulcers Psychiatry: Judgement and insight appear normal. Mood & affect appropriate.     Data Reviewed: I have personally reviewed following labs and imaging  studies  CBC: Recent Labs  Lab 10/24/20 1130 10/25/20 0518 10/26/20 0620 10/27/20 0502 10/28/20 0422  WBC 9.3 9.1 7.3 6.9 6.8  NEUTROABS 7.3  --  4.7 4.8 5.0  HGB 8.8* 7.9* 8.0* 8.5* 7.9*  HCT 29.3* 25.2* 26.2* 27.0* 25.7*  MCV 105.4* 100.8* 102.3* 101.9* 102.0*  PLT 205 204 161 144* 993*   Basic Metabolic Panel: Recent Labs  Lab 10/24/20 1130 10/25/20 0518 10/26/20 0508 10/27/20 0502 10/28/20 0422  NA 136 137 139 138 139  K 5.2* 4.5 4.5 3.9 3.8  CL 107 105 105 105 108  CO2 15* 20* 20* 22 22  GLUCOSE 169* 135* 114* 147* 166*  BUN 52* 53* 58* 59* 50*  CREATININE 3.79* 3.24* 3.57* 2.96* 2.13*  CALCIUM 9.1 8.5* 8.2* 8.4* 8.8*  MG 2.8*  --  2.7* 2.7* 2.5*  PHOS  --   --  4.8*  --  3.8   GFR: Estimated Creatinine Clearance: 23 mL/min (A) (by C-G formula based on SCr of 2.13 mg/dL (H)). Liver Function Tests: Recent Labs  Lab 10/24/20 1130  AST 30  ALT 13  ALKPHOS 55  BILITOT 1.0  PROT 6.9  ALBUMIN 3.6   No results for input(s): LIPASE, AMYLASE in the last 168 hours. No results for input(s): AMMONIA in the last 168 hours. Coagulation Profile: No results for input(s): INR, PROTIME in the last 168 hours. Cardiac Enzymes: Recent Labs  Lab 10/25/20 0518  CKTOTAL 472*   BNP (last 3 results) No results for input(s): PROBNP in the last 8760 hours. HbA1C: No results for input(s): HGBA1C in the last 72 hours. CBG: Recent Labs  Lab 10/27/20 1649 10/27/20 2041 10/28/20 0557 10/28/20 0813 10/28/20 1227  GLUCAP 114* 199* 154* 174* 238*   Lipid Profile: No results for input(s): CHOL, HDL, LDLCALC, TRIG, CHOLHDL, LDLDIRECT in the last 72 hours. Thyroid Function Tests: No results for input(s): TSH, T4TOTAL, FREET4, T3FREE, THYROIDAB in the last 72 hours. Anemia Panel: No results for input(s): VITAMINB12, FOLATE, FERRITIN, TIBC, IRON, RETICCTPCT in the last 72 hours. Sepsis Labs: No results for input(s): PROCALCITON, LATICACIDVEN in the last 168  hours.  Recent Results (from the past 240 hour(s))  Resp Panel by RT-PCR (Flu A&B, Covid) Nasopharyngeal Swab     Status: None   Collection Time: 10/24/20 11:24 AM   Specimen: Nasopharyngeal Swab; Nasopharyngeal(NP) swabs in vial transport medium  Result Value Ref Range Status   SARS Coronavirus 2 by RT PCR NEGATIVE NEGATIVE Final    Comment: (NOTE) SARS-CoV-2 target nucleic acids are NOT DETECTED.  The SARS-CoV-2 RNA is generally detectable in upper respiratory specimens during the acute phase of infection. The lowest concentration of SARS-CoV-2 viral copies this assay can detect is 138 copies/mL. A negative result does not preclude SARS-Cov-2 infection and should not be used as the sole basis for treatment or other patient management decisions. A negative result may occur with  improper specimen collection/handling, submission of specimen other than nasopharyngeal swab, presence of viral mutation(s) within the areas targeted by this assay, and inadequate  number of viral copies(<138 copies/mL). A negative result must be combined with clinical observations, patient history, and epidemiological information. The expected result is Negative.  Fact Sheet for Patients:  EntrepreneurPulse.com.au  Fact Sheet for Healthcare Providers:  IncredibleEmployment.be  This test is no t yet approved or cleared by the [place] FDA and  has been authorized for detection and/or diagnosis of SARS-CoV-2 by FDA under an Emergency Use Authorization (EUA). This EUA will remain  in effect (meaning this test can be used) for the duration of the COVID-19 declaration under Section 564(b)(1) of the Act, 21 U.S.C.section 360bbb-3(b)(1), unless the authorization is terminated  or revoked sooner.       Influenza A by PCR NEGATIVE NEGATIVE Final   Influenza B by PCR NEGATIVE NEGATIVE Final    Comment: (NOTE) The Xpert Xpress SARS-CoV-2/FLU/RSV plus assay is intended  as an aid in the diagnosis of influenza from Nasopharyngeal swab specimens and should not be used as a sole basis for treatment. Nasal washings and aspirates are unacceptable for Xpert Xpress SARS-CoV-2/FLU/RSV testing.  Fact Sheet for Patients: EntrepreneurPulse.com.au  Fact Sheet for Healthcare Providers: IncredibleEmployment.be  This test is not yet approved or cleared by the [place] FDA and has been authorized for detection and/or diagnosis of SARS-CoV-2 by FDA under an Emergency Use Authorization (EUA). This EUA will remain in effect (meaning this test can be used) for the duration of the COVID-19 declaration under Section 564(b)(1) of the Act, 21 U.S.C. section 360bbb-3(b)(1), unless the authorization is terminated or revoked.  Performed at [facility], [place], [place] [address]   C Difficile Quick Screen w PCR reflex     Status: Abnormal   Collection Time: 10/24/20  4:45 PM   Specimen: STOOL  Result Value Ref Range Status   C Diff antigen POSITIVE (A) NEGATIVE Final   C Diff toxin NEGATIVE NEGATIVE Final   C Diff interpretation Results are indeterminate. See PCR results.  Final    Comment: Performed at [facility], [address] [place], [place] [address]  C. Diff by PCR, Reflexed     Status: Abnormal   Collection Time: 10/24/20  4:45 PM  Result Value Ref Range Status   Toxigenic C. Difficile by PCR POSITIVE (A) NEGATIVE Final    Comment: Positive for toxigenic C. difficile with little to no toxin production. Only treat if clinical presentation suggests symptomatic illness. Performed at [facility], [address], [place], [place] [address]   Surgical PCR screen     Status: Abnormal   Collection Time: 10/26/20  9:52 AM   Specimen: Nasal Mucosa; Nasal Swab  Result Value Ref Range Status   MRSA, PCR NEGATIVE NEGATIVE Final   Staphylococcus aureus POSITIVE (A) NEGATIVE Final     Comment: (NOTE) The Xpert SA Assay (FDA approved for NASAL specimens in patients 30 years of age and older), is one component of a comprehensive surveillance program. It is not intended to diagnose infection nor to guide or monitor treatment. Performed at [facility], [address] [place], [place] [phone]          Radiology Studies: CT ABDOMEN PELVIS WO CONTRAST  Result Date: 10/27/2020 CLINICAL DATA:  Follow-up left renal lesions seen on recent ultrasound. EXAM: CT ABDOMEN AND PELVIS WITHOUT CONTRAST TECHNIQUE: Multidetector CT imaging of the abdomen and pelvis was performed following the standard protocol without IV contrast. COMPARISON:  03/17/2018 FINDINGS: Examination is limited by respiratory motion and artifact from the arms  being down by the side. Lower chest: Stable emphysematous changes and pulmonary scarring. Stable cardiac enlargement and advanced aortic and coronary artery calcifications. Pacer wires are noted. Hepatobiliary: Stable advanced cirrhotic changes involving the liver without obvious hepatic lesions without contrast. No intra or extrahepatic biliary dilatation. Pancreas: No mass, inflammation or ductal dilatation. Spleen: Mild stable splenomegaly. Adrenals/Urinary Tract: The adrenal glands are unremarkable and stable. There is a solid-appearing 2.2 cm lesion associated with the lateral cortex of the left kidney in the interpolar region. This measures 32 Hounsfield units. On the prior study from 2019 it measured approximately 16.5 mm. No worrisome right-sided renal mass. Stable prominent extra renal pelvis. No renal or obstructing ureteral calculi. No bladder calculi or bladder mass. Stomach/Bowel: The stomach, duodenum, small bowel and colon are grossly normal. Vascular/Lymphatic: Advanced atherosclerotic calcifications involving the aorta and branch vessels but no aneurysm. No mesenteric or retroperitoneal mass or adenopathy. Reproductive: The uterus and  ovaries are unremarkable. Other: Moderate free abdominal/free pelvic fluid likely secondary to the patient's cirrhosis. Musculoskeletal: No significant bony findings. T12 vertebral augmentation changes are noted. IMPRESSION: 1. Stable advanced cirrhotic changes involving the liver with associated mild splenomegaly and moderate abdominal/free pelvic fluid. No focal hepatic lesions are identified without contrast. 2. Slight interval enlargement of the solid-appearing left renal lesion. 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 Recommend continued surveillance. 3. No acute abdominal/pelvic findings or adenopathy. 4. Advanced atherosclerotic calcifications involving the aorta and branch vessels. 5. Stable emphysematous changes and pulmonary scarring at the lung bases. 6. Emphysema and aortic atherosclerosis. Aortic Atherosclerosis (ICD10-I70.0) and Emphysema (ICD10-J43.9). Electronically Signed   By: Marijo Sanes M.D.   On: 10/27/2020 15:38   DG Ankle 2 Views Right  Result Date: 10/26/2020 CLINICAL DATA:  Right ankle pain and bruising after a fall. EXAM: RIGHT ANKLE - 2 VIEW COMPARISON:  None. FINDINGS: Two-view exam shows no dislocation. Lateral film suggests the presence of a cortical step-off along the posterior cortex of the distal fibula, projecting through the tibia. No associated subluxation. IMPRESSION: Lateral film suggests a nondisplaced fracture of the distal fibula. Correlation for point tenderness in this region recommended. Dedicated three-view exam or CT imaging could be used to further evaluate as clinically warranted. Electronically Signed   By: Misty Stanley M.D.   On: 10/26/2020 16:11   EP PPM/ICD IMPLANT  Result Date: 10/26/2020 Successful dual-chamber pacemaker implantation       Scheduled Meds: . aspirin EC  81 mg Oral Daily  . Chlorhexidine Gluconate Cloth  6 each Topical Q0600  . cholecalciferol  2,000 Units Oral Daily  . feeding supplement  237 mL  Oral BID BM  . Ferrous Fumarate  1 tablet Oral BID  . hydrOXYzine  10 mg Oral QHS  . insulin aspart  0-15 Units Subcutaneous TID WC  . latanoprost  1 drop Left Eye QHS  . loratadine  10 mg Oral Daily  . magnesium oxide  800 mg Oral Daily  . niacin  500 mg Oral QHS  . omega-3 acid ethyl esters  1 g Oral Daily  . pantoprazole  40 mg Oral Daily  . sodium bicarbonate  650 mg Oral TID  . sodium chloride flush  3 mL Intravenous Q12H  . vancomycin  125 mg Oral QID  . vitamin B-12  1,000 mcg Oral Daily   Continuous Infusions:   LOS: 3 days    Time spent: 27 minutes    Sharen Hones, MD [facility]   To  contact the attending provider between 7A-7P or the covering provider during after hours 7P-7A, please log into the web site [email or web address] and access using universal [place] password for that web site. If you do not have the password, please call the hospital operator.  10/28/2020, 1:02 PM

## 2020-10-28 NOTE — Progress Notes (Signed)
[facility]  ROUNDING NOTE   Subjective:   [name] was admitted to [facility] on 10/24/2020 for Syncope and collapse [R55] Complete heart block ([place]) [I44.2] AKI (acute kidney injury) ([place]) [N17.9]  Patient had a syncopal episode. Patient states she has been having diarrhea and poor PO intake. Patient was found to have C. Diff colitis. She also complains of dysphagia and odynophagia. Thought to be secondary to bisphosphonate.   Patient found to have bradycardia with heart block. Pacemaker scheduled today.   Patient seen sitting up in chair Alert and oriented States she feels well today Denies nausea Denies shortness of breath   Objective:  Vital signs in last 24 hours:  Temp:  [97.6 F (36.4 C)-99.4 F (37.4 C)] 99.4 F (37.4 C) (04/22 1249) Pulse Rate:  [83-90] 84 (04/22 1249) Resp:  [17-20] 18 (04/22 0551) BP: (127-150)/(38-69) 147/60 (04/22 1249) SpO2:  [94 %-97 %] 97 % (04/22 1249)  Weight change:  Filed Weights   10/26/20 0729 10/26/20 1115 10/27/20 0501  Weight: 88.2 kg 83.9 kg 88 kg    Intake/Output: I/O last 3 completed shifts: In: 343 [P.O.:240; I.V.:3; IV Piggyback:100] Out: 0    Intake/Output this shift:  No intake/output data recorded.  Physical Exam: General: NAD, sitting in chair  Head: Normocephalic, atraumatic. moist oral mucosal membranes  Eyes: Anicteric  Neck: Supple, trachea midline  Lungs:  Clear to auscultation  Heart: Regular rate and rhythm  Abdomen:  Soft, nontender,   Extremities: no peripheral edema.  Neurologic: Nonfocal, moving all four extremities  Skin: No lesions  Access: none    Basic Metabolic Panel: Recent Labs  Lab 10/24/20 1130 10/25/20 0518 10/26/20 0508 10/27/20 0502 10/28/20 0422  NA 136 137 139 138 139  K 5.2* 4.5 4.5 3.9 3.8  CL 107 105 105 105 108  CO2 15* 20* 20* 22 22  GLUCOSE 169* 135* 114* 147* 166*  BUN 52* 53* 58* 59* 50*  CREATININE 3.79* 3.24* 3.57* 2.96* 2.13*  CALCIUM 9.1  8.5* 8.2* 8.4* 8.8*  MG 2.8*  --  2.7* 2.7* 2.5*  PHOS  --   --  4.8*  --  3.8    Liver Function Tests: Recent Labs  Lab 10/24/20 1130  AST 30  ALT 13  ALKPHOS 55  BILITOT 1.0  PROT 6.9  ALBUMIN 3.6   No results for input(s): LIPASE, AMYLASE in the last 168 hours. No results for input(s): AMMONIA in the last 168 hours.  CBC: Recent Labs  Lab 10/24/20 1130 10/25/20 0518 10/26/20 0620 10/27/20 0502 10/28/20 0422  WBC 9.3 9.1 7.3 6.9 6.8  NEUTROABS 7.3  --  4.7 4.8 5.0  HGB 8.8* 7.9* 8.0* 8.5* 7.9*  HCT 29.3* 25.2* 26.2* 27.0* 25.7*  MCV 105.4* 100.8* 102.3* 101.9* 102.0*  PLT 205 204 161 144* 144*    Cardiac Enzymes: Recent Labs  Lab 10/25/20 0518  CKTOTAL 472*    BNP: Invalid input(s): POCBNP  CBG: Recent Labs  Lab 10/27/20 1649 10/27/20 2041 10/28/20 0557 10/28/20 0813 10/28/20 1227  GLUCAP 114* 199* 154* 174* 238*    Microbiology: Results for orders placed or performed during the hospital encounter of 10/24/20  Resp Panel by RT-PCR (Flu A&B, Covid) Nasopharyngeal Swab     Status: None   Collection Time: 10/24/20 11:24 AM   Specimen: Nasopharyngeal Swab; Nasopharyngeal(NP) swabs in vial transport medium  Result Value Ref Range Status   SARS Coronavirus 2 by RT PCR NEGATIVE NEGATIVE Final  Comment: (NOTE) SARS-CoV-2 target nucleic acids are NOT DETECTED.  The SARS-CoV-2 RNA is generally detectable in upper respiratory specimens during the acute phase of infection. The lowest concentration of SARS-CoV-2 viral copies this assay can detect is 138 copies/mL. A negative result does not preclude SARS-Cov-2 infection and should not be used as the sole basis for treatment or other patient management decisions. A negative result may occur with  improper specimen collection/handling, submission of specimen other than nasopharyngeal swab, presence of viral mutation(s) within the areas targeted by this assay, and inadequate number of viral copies(<138  copies/mL). A negative result must be combined with clinical observations, patient history, and epidemiological information. The expected result is Negative.  Fact Sheet for Patients:  EntrepreneurPulse.com.au  Fact Sheet for Healthcare Providers:  IncredibleEmployment.be  This test is no t yet approved or cleared by the [place] FDA and  has been authorized for detection and/or diagnosis of SARS-CoV-2 by FDA under an Emergency Use Authorization (EUA). This EUA will remain  in effect (meaning this test can be used) for the duration of the COVID-19 declaration under Section 564(b)(1) of the Act, 21 U.S.C.section 360bbb-3(b)(1), unless the authorization is terminated  or revoked sooner.       Influenza A by PCR NEGATIVE NEGATIVE Final   Influenza B by PCR NEGATIVE NEGATIVE Final    Comment: (NOTE) The Xpert Xpress SARS-CoV-2/FLU/RSV plus assay is intended as an aid in the diagnosis of influenza from Nasopharyngeal swab specimens and should not be used as a sole basis for treatment. Nasal washings and aspirates are unacceptable for Xpert Xpress SARS-CoV-2/FLU/RSV testing.  Fact Sheet for Patients: EntrepreneurPulse.com.au  Fact Sheet for Healthcare Providers: IncredibleEmployment.be  This test is not yet approved or cleared by the [place] FDA and has been authorized for detection and/or diagnosis of SARS-CoV-2 by FDA under an Emergency Use Authorization (EUA). This EUA will remain in effect (meaning this test can be used) for the duration of the COVID-19 declaration under Section 564(b)(1) of the Act, 21 U.S.C. section 360bbb-3(b)(1), unless the authorization is terminated or revoked.  Performed at [facility], [place], [place] [address]   C Difficile Quick Screen w PCR reflex     Status: Abnormal   Collection Time: 10/24/20  4:45 PM   Specimen: STOOL  Result Value  Ref Range Status   C Diff antigen POSITIVE (A) NEGATIVE Final   C Diff toxin NEGATIVE NEGATIVE Final   C Diff interpretation Results are indeterminate. See PCR results.  Final    Comment: Performed at [facility], [address] [place], [place] [address]  C. Diff by PCR, Reflexed     Status: Abnormal   Collection Time: 10/24/20  4:45 PM  Result Value Ref Range Status   Toxigenic C. Difficile by PCR POSITIVE (A) NEGATIVE Final    Comment: Positive for toxigenic C. difficile with little to no toxin production. Only treat if clinical presentation suggests symptomatic illness. Performed at [facility], [address], [place], [place] [address]   Surgical PCR screen     Status: Abnormal   Collection Time: 10/26/20  9:52 AM   Specimen: Nasal Mucosa; Nasal Swab  Result Value Ref Range Status   MRSA, PCR NEGATIVE NEGATIVE Final   Staphylococcus aureus POSITIVE (A) NEGATIVE Final    Comment: (NOTE) The Xpert SA Assay (FDA approved for NASAL specimens in patients 30 years of age and older), is one component of a comprehensive surveillance program. It is  not intended to diagnose infection nor to guide or monitor treatment. Performed at [facility], [address] [place], [place] [address]     Coagulation Studies: No results for input(s): LABPROT, INR in the last 72 hours.  Urinalysis: No results for input(s): COLORURINE, LABSPEC, PHURINE, GLUCOSEU, HGBUR, BILIRUBINUR, KETONESUR, PROTEINUR, UROBILINOGEN, NITRITE, LEUKOCYTESUR in the last 72 hours.  Invalid input(s): APPERANCEUR    Imaging: CT ABDOMEN PELVIS WO CONTRAST  Result Date: 10/27/2020 CLINICAL DATA:  Follow-up left renal lesions seen on recent ultrasound. EXAM: CT ABDOMEN AND PELVIS WITHOUT CONTRAST TECHNIQUE: Multidetector CT imaging of the abdomen and pelvis was performed following the standard protocol without IV contrast. COMPARISON:  03/17/2018 FINDINGS: Examination is limited by  respiratory motion and artifact from the arms being down by the side. Lower chest: Stable emphysematous changes and pulmonary scarring. Stable cardiac enlargement and advanced aortic and coronary artery calcifications. Pacer wires are noted. Hepatobiliary: Stable advanced cirrhotic changes involving the liver without obvious hepatic lesions without contrast. No intra or extrahepatic biliary dilatation. Pancreas: No mass, inflammation or ductal dilatation. Spleen: Mild stable splenomegaly. Adrenals/Urinary Tract: The adrenal glands are unremarkable and stable. There is a solid-appearing 2.2 cm lesion associated with the lateral cortex of the left kidney in the interpolar region. This measures 32 Hounsfield units. On the prior study from 2019 it measured approximately 16.5 mm. No worrisome right-sided renal mass. Stable prominent extra renal pelvis. No renal or obstructing ureteral calculi. No bladder calculi or bladder mass. Stomach/Bowel: The stomach, duodenum, small bowel and colon are grossly normal. Vascular/Lymphatic: Advanced atherosclerotic calcifications involving the aorta and branch vessels but no aneurysm. No mesenteric or retroperitoneal mass or adenopathy. Reproductive: The uterus and ovaries are unremarkable. Other: Moderate free abdominal/free pelvic fluid likely secondary to the patient's cirrhosis. Musculoskeletal: No significant bony findings. T12 vertebral augmentation changes are noted. IMPRESSION: 1. Stable advanced cirrhotic changes involving the liver with associated mild splenomegaly and moderate abdominal/free pelvic fluid. No focal hepatic lesions are identified without contrast. 2. Slight interval enlargement of the solid-appearing left renal lesion. 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 Recommend continued surveillance. 3. No acute abdominal/pelvic findings or adenopathy. 4. Advanced atherosclerotic calcifications involving the aorta and branch vessels. 5.  Stable emphysematous changes and pulmonary scarring at the lung bases. 6. Emphysema and aortic atherosclerosis. Aortic Atherosclerosis (ICD10-I70.0) and Emphysema (ICD10-J43.9). Electronically Signed   By: Marijo Sanes M.D.   On: 10/27/2020 15:38   DG Ankle 2 Views Right  Result Date: 10/26/2020 CLINICAL DATA:  Right ankle pain and bruising after a fall. EXAM: RIGHT ANKLE - 2 VIEW COMPARISON:  None. FINDINGS: Two-view exam shows no dislocation. Lateral film suggests the presence of a cortical step-off along the posterior cortex of the distal fibula, projecting through the tibia. No associated subluxation. IMPRESSION: Lateral film suggests a nondisplaced fracture of the distal fibula. Correlation for point tenderness in this region recommended. Dedicated three-view exam or CT imaging could be used to further evaluate as clinically warranted. Electronically Signed   By: Misty Stanley M.D.   On: 10/26/2020 16:11     Medications:    . aspirin EC  81 mg Oral Daily  . Chlorhexidine Gluconate Cloth  6 each Topical Q0600  . cholecalciferol  2,000 Units Oral Daily  . feeding supplement  237 mL Oral BID BM  . Ferrous Fumarate  1 tablet Oral BID  . hydrOXYzine  10 mg Oral QHS  . insulin aspart  0-15 Units Subcutaneous TID  WC  . latanoprost  1 drop Left Eye QHS  . loratadine  10 mg Oral Daily  . magnesium oxide  800 mg Oral Daily  . niacin  500 mg Oral QHS  . omega-3 acid ethyl esters  1 g Oral Daily  . pantoprazole  40 mg Oral Daily  . sodium bicarbonate  650 mg Oral TID  . sodium chloride flush  3 mL Intravenous Q12H  . vancomycin  125 mg Oral QID  . vitamin B-12  1,000 mcg Oral Daily   acetaminophen **OR** acetaminophen, acetaminophen, ondansetron **OR** ondansetron (ZOFRAN) IV, ondansetron (ZOFRAN) IV  Assessment/ Plan:  [name] is a 80 y.o. white female with hypertension, pulmonary hypertension, insulin dependent diabetes mellitus type II, hyperlipidemia, anemia, hepatic  cirrhosis, gout, left renal mass, osteoporosis, glaucoma, rheumatoid arthritis, GERD, history of breast cancer status post right mastectomy.   1. Acute kidney injury on chronic kidney disease stage IIIB with proteinuria. Baseline creatinine of 1.3, GFR of 40 on 08/24/20.  Followed by Dr. Holley Raring.  Anuric since admission Chronic kidney disease secondary to diabetic nephropathy.  Acute kidney injury secondary to ATN from prerenal azotemia.  No IV contrast exposure.  - Repeat imaging: CT to look for new obstruction and further characterize patient's left renal mass. Slight enlargement of solid appearing left renal lesion, likely slow growing renal cell carcinoma. Recommend monitoring.  - Holding losartan.   2. Hypertension with bradycardia.  - not on a beta blocker or calcium channel blocker.  - Appreciate cardiology input. [place] scheduled.  - echocardiogram EF 65-70%, right and left atrial mildly dilated, mitral valve degeneration and Trivial regurgitation.    3. Diabetes mellitus type II with chronic kidney disease. Insulin dependent. Hemoglobin A1c of 5.3%.  - Holding metformin - Not on an SGLT-2 inhibitor as an outpatient.   4. Anemia with chronic kidney disease: macrocytic. history of iron deficiency and vitamin B12 deficiency. Currently B12 and iron levels at goal.   5. Left renal mass: ultrasound from 10/14/2020 reviewed. Increased in size since 2021. Follows by urology as outpatient.  - CT of abdomen and pelvis.    LOS: 3 Kharson Rasmusson 4/22/20222:03 PM

## 2020-10-28 NOTE — TOC Initial Note (Signed)
Transition of Care [facility]) - Initial/Assessment Note    Patient Details  Name: Emily Faulkner MRN: [id number] Date of Birth: [birth year]  Transition of Care [facility]) CM/SW Contact:    Eileen Stanford, LCSW Phone Number: 10/28/2020, 4:05 PM  Clinical Narrative:   Pt is alert and oriented. Pt states she lives alone. Pt states she has sons and daughter in laws that are involved in her care and assist her. Pt states she drives herself to appointments and when she cant her family will. Pt sees Dr. Ellison Hughs at [facility] in [place]. [facility] [place] Pt states she has no problem affording her meds. Pt states she is agreeable to [facility] however doesn't have a agency preference. Pt suggest CSW contact her son Kasandra Knudsen to ask him.  CSW spoke with Kasandra Knudsen and he states [facility] sounds familiar and at this time they would like to go with them. Referral made to [facility].              Expected Discharge Plan: [place] Barriers to Discharge: Continued Medical Work up   Patient Goals and CMS Choice Patient states their goals for this hospitalization and ongoing recovery are:: to get better   Choice offered to / list presented to : [place]  Expected Discharge Plan and Services Expected Discharge Plan: [place] In-house Referral: NA Discharge Planning Services: NA Post Acute Care Choice: [place] arrangements for the past 2 months: [place]: PT,OT,RN [place]: [facility] [employer] [facility] [employer] Date [place]: 10/28/20 Time [place]: 1604 Representative spoke with at [place]: [place]  Prior Living Arrangements/Services Living arrangements for the past 2 months: [place] with:: Self Patient language and need for interpreter reviewed:: Yes Do you feel safe going back to the place where you live?: Yes      Need for Family  Participation in Patient Care: Yes (Comment) Care giver support system in place?: Yes (comment)   Criminal Activity/Legal Involvement Pertinent to Current Situation/Hospitalization: No - Comment as needed  Activities of Daily Living Home Assistive Devices/Equipment: Cane (specify quad or straight),Dentures (specify type) ADL Screening (condition at time of admission) Patient's cognitive ability adequate to safely complete daily activities?: Yes Is the patient deaf or have difficulty hearing?: No Does the patient have difficulty seeing, even when wearing glasses/contacts?: No Does the patient have difficulty concentrating, remembering, or making decisions?: No Patient able to express need for assistance with ADLs?: Yes Does the patient have difficulty dressing or bathing?: No Independently performs ADLs?: Yes (appropriate for developmental age) Does the patient have difficulty walking or climbing stairs?: No Weakness of Legs: Both Weakness of Arms/Hands: None  Permission Sought/Granted Permission sought to share information with : Family Supports    Share Information with NAME: danny  Permission granted to share info w AGENCY: [facility]  Permission granted to share info w Relationship: son     Emotional Assessment Appearance:: Appears stated age Attitude/Demeanor/Rapport: Engaged Affect (typically observed): Accepting Orientation: : Oriented to  Time,Oriented to Situation,Oriented to Place,Oriented to Self Alcohol / Substance Use: Not Applicable Psych Involvement: No (comment)  Admission diagnosis:  Syncope and collapse [R55] Complete heart block (HCC) [I44.2] AKI (acute kidney injury) ([place]) [N17.9] Patient Active Problem List  Diagnosis Date Noted  . Syncope and collapse 10/24/2020  . Complete heart block ([place]) 10/24/2020  . Hyperkalemia 10/24/2020  . Odynophagia 10/24/2020  . Age-related osteoporosis without current pathological fracture 05/09/2018  . AKI (acute kidney  injury) ([place]) 03/17/2018  . Renal mass 03/17/2018  . Microalbuminuric diabetic nephropathy ([place]) 11/02/2016  . Pulmonary hypertension ([place]) 09/11/2016  . Shortness of breath 09/06/2016  . Hypertension 09/06/2016  . High cholesterol 09/06/2016  . Anemia 09/06/2016  . Iron deficiency anemia 04/15/2016  . Type 2 diabetes mellitus with neurological manifestations, uncontrolled ([place]) 08/04/2014  . Hyperlipidemia, unspecified 01/22/2014  . Personal history of breast cancer 07/17/2012   PCP:  Sofie Hartigan, MD Pharmacy[facility]   [id number] [place] [place] [address]Phone: [phone]: [phone]    Social Determinants of Health (SDOH) Interventions    Readmission Risk Interventions Readmission Risk Prevention Plan 10/28/2020 03/21/2018  Transportation Screening Complete Complete  PCP or Specialist Appt within 5-7 Days - Complete  PCP or Specialist Appt within 3-5 Days Complete -  Home Care Screening - Complete  Medication Review (RN CM) - Complete  HRI or Home Care Consult Complete -  Social Work Consult for [place]/Counseling Complete -  Palliative Care Screening Not Applicable -  Medication Review (Press photographer Complete -  Some recent data might be hidden

## 2020-10-28 NOTE — Evaluation (Signed)
Physical Therapy Evaluation Patient Details Name: Emily Faulkner MRN: [id number] DOB: 1941-06-07 Today's Date: 10/28/2020   History of Present Illness  Patient is a 80 yo female that presented to the ED for syncopal episode, dizziness. s/p pacemaker placement. Workup also showed nondisplaced fracture of distal fibula. PMH of DM, CKDIII, HTN.    Clinical Impression  Patient alert, agreeable to PT. Pt reported at baseline she is independent, was using a SPC due to feeling light headed recently but normally is independent for ambulation as well. Pt stated she has extensive family assist and is confident she could receive help as needed at discharge.  The patient with cam boot throughout mobility, education provided and readjusted as needed. Supine to sit with supervision, and at least 7 minutes spent in sitting EOB for BP assessment as well as RN administration of medications. Sit <> stand with RW and CGA, cued for hand placement. She was able to ambulate ~53f total, no light headedness/dizziness to report, HR in the 90s. Very decreased gait velocity and occasional cueing for RW, no unsteadiness noted.  Overall the patient demonstrated deficits (see "PT Problem List") that impede the patient's functional abilities, safety, and mobility and would benefit from skilled PT intervention. Recommendation is HHPT with supervision intermittently/OOB/mobility.     Follow Up Recommendations Home health PT;Supervision - Intermittent;Supervision for mobility/OOB    Equipment Recommendations  None recommended by PT    Recommendations for Other Services       Precautions / Restrictions Precautions Precautions: Fall Restrictions Weight Bearing Restrictions: Yes RLE Weight Bearing: Weight bearing as tolerated      Mobility  Bed Mobility Overal bed mobility: Needs Assistance Bed Mobility: Supine to Sit     Supine to sit: Supervision     General bed mobility comments: use of bed rails     Transfers Overall transfer level: Needs assistance Equipment used: Rolling walker (2 wheeled) Transfers: Sit to/from Stand Sit to Stand: Supervision;Min guard            Ambulation/Gait Ambulation/Gait assistance: Min guard Gait Distance (Feet): 30 Feet Assistive device: Rolling walker (2 wheeled) Gait Pattern/deviations: WFL(Within Functional Limits)     General Gait Details: decreased velocity, no unsteadiness noted. Pt with cam boot throughout  Stairs            Wheelchair Mobility    Modified Rankin (Stroke Patients Only)       Balance Overall balance assessment: Needs assistance Sitting-balance support: Feet supported Sitting balance-Leahy Scale: Good Sitting balance - Comments: pt able to sit EOB for at least 7 minutes for medication administration from RN in room     Standing balance-Leahy Scale: Fair                               Pertinent Vitals/Pain Pain Assessment: No/denies pain    Home Living Family/patient expects to be discharged to:: Private residence Living Arrangements: Alone Available Help at Discharge: Family;Available 24 hours/day;Available PRN/intermittently Type of Home: House Home Access: Stairs to enter Entrance Stairs-Rails: Right Entrance Stairs-Number of Steps: 2 Home Layout: Two level;Able to live on main level with bedroom/bathroom Home Equipment: WGilford Rile- 2 wheels;Wheelchair - mInsurance claims handler- standard      Prior Function Level of Independence: Independent         Comments: was using a cane due to feeling light headed but normally independent for ambulation     Hand Dominance   Dominant Hand:  Right    Extremity/Trunk Assessment   Upper Extremity Assessment Upper Extremity Assessment: Overall WFL for tasks assessed    Lower Extremity Assessment Lower Extremity Assessment: Overall WFL for tasks assessed (cam boot on RLE)    Cervical / Trunk Assessment Cervical / Trunk  Assessment: Normal  Communication   Communication: No difficulties  Cognition Arousal/Alertness: Awake/alert Behavior During Therapy: WFL for tasks assessed/performed Overall Cognitive Status: Within Functional Limits for tasks assessed                                        General Comments      Exercises Other Exercises Other Exercises: BP assessed, pt not orthostatic   Assessment/Plan    PT Assessment Patient needs continued PT services  PT Problem List Decreased strength;Decreased activity tolerance;Decreased balance;Decreased mobility       PT Treatment Interventions DME instruction;Balance training;Gait training;Neuromuscular re-education;Stair training;Functional mobility training;Patient/family education;Therapeutic activities;Therapeutic exercise    PT Goals (Current goals can be found in the Care Plan section)  Acute Rehab PT Goals Patient Stated Goal: to go home PT Goal Formulation: With patient Time For Goal Achievement: 11/11/20 Potential to Achieve Goals: Good    Frequency Min 2X/week   Barriers to discharge        Co-evaluation               AM-PAC PT "6 Clicks" Mobility  Outcome Measure Help needed turning from your back to your side while in a flat bed without using bedrails?: None Help needed moving from lying on your back to sitting on the side of a flat bed without using bedrails?: None Help needed moving to and from a bed to a chair (including a wheelchair)?: None Help needed standing up from a chair using your arms (e.g., wheelchair or bedside chair)?: A Little Help needed to walk in hospital room?: A Little Help needed climbing 3-5 steps with a railing? : A Little 6 Click Score: 21    End of Session Equipment Utilized During Treatment: Gait belt Activity Tolerance: Patient tolerated treatment well Patient left: in chair;with call bell/phone within reach;with chair alarm set Nurse Communication: Mobility status PT  Visit Diagnosis: Other abnormalities of gait and mobility (R26.89)    Time: 0383-3383 PT Time Calculation (min) (ACUTE ONLY): 40 min   Charges:   PT Evaluation $PT Eval Low Complexity: 1 Low PT Treatments $Therapeutic Exercise: 8-22 mins $Therapeutic Activity: 8-22 mins       Lieutenant Diego PT, DPT 12:03 PM,10/28/20

## 2020-10-29 DIAGNOSIS — A0472 Enterocolitis due to Clostridium difficile, not specified as recurrent: Secondary | ICD-10-CM

## 2020-10-29 LAB — BASIC METABOLIC PANEL
Anion gap: 12 (ref 5–15)
BUN: 44 mg/dL — ABNORMAL HIGH (ref 8–23)
CO2: 21 mmol/L — ABNORMAL LOW (ref 22–32)
Calcium: 8.9 mg/dL (ref 8.9–10.3)
Chloride: 107 mmol/L (ref 98–111)
Creatinine, Ser: 1.81 mg/dL — ABNORMAL HIGH (ref 0.44–1.00)
GFR, Estimated: 28 mL/min — ABNORMAL LOW (ref >60)
Glucose, Bld: 161 mg/dL — ABNORMAL HIGH (ref 70–99)
Potassium: 3.5 mmol/L (ref 3.5–5.1)
Sodium: 140 mmol/L (ref 135–145)

## 2020-10-29 LAB — CBC WITH DIFFERENTIAL/PLATELET
Abs Immature Granulocytes: 0.03 10*3/uL (ref 0.00–0.07)
Basophils Absolute: 0 10*3/uL (ref 0.0–0.1)
Basophils Relative: 1 %
Eosinophils Absolute: 0.3 10*3/uL (ref 0.0–0.5)
Eosinophils Relative: 5 %
HCT: 26.3 % — ABNORMAL LOW (ref 36.0–46.0)
Hemoglobin: 8.2 g/dL — ABNORMAL LOW (ref 12.0–15.0)
Immature Granulocytes: 1 %
Lymphocytes Relative: 11 %
Lymphs Abs: 0.7 10*3/uL (ref 0.7–4.0)
MCH: 31.4 pg (ref 26.0–34.0)
MCHC: 31.2 g/dL (ref 30.0–36.0)
MCV: 100.8 fL — ABNORMAL HIGH (ref 80.0–100.0)
Monocytes Absolute: 0.6 10*3/uL (ref 0.1–1.0)
Monocytes Relative: 9 %
Neutro Abs: 4.9 10*3/uL (ref 1.7–7.7)
Neutrophils Relative %: 73 %
Platelets: 143 10*3/uL — ABNORMAL LOW (ref 150–400)
RBC: 2.61 MIL/uL — ABNORMAL LOW (ref 3.87–5.11)
RDW: 16.3 % — ABNORMAL HIGH (ref 11.5–15.5)
WBC: 6.5 10*3/uL (ref 4.0–10.5)
nRBC: 0 % (ref 0.0–0.2)

## 2020-10-29 LAB — GLUCOSE, CAPILLARY
Glucose-Capillary: 164 mg/dL — ABNORMAL HIGH (ref 70–99)
Glucose-Capillary: 152 mg/dL — ABNORMAL HIGH (ref 70–99)
Glucose-Capillary: 250 mg/dL — ABNORMAL HIGH (ref 70–99)

## 2020-10-29 LAB — MAGNESIUM: Magnesium: 2.3 mg/dL (ref 1.7–2.4)

## 2020-10-29 MED ORDER — VANCOMYCIN 50 MG/ML ORAL SOLUTION: 125.0000 mg | Freq: Four times a day (QID) | ORAL | 0 refills | Status: AC

## 2020-10-29 MED ORDER — SODIUM BICARBONATE 650 MG PO TABS: 650.0000 mg | ORAL_TABLET | Freq: Three times a day (TID) | ORAL | 0 refills | Status: AC

## 2020-10-29 NOTE — Progress Notes (Signed)
[facility]  ROUNDING NOTE   Subjective:   [name] was admitted to [facility] on 10/24/2020 for Syncope and collapse [R55] Complete heart block ([place]) [I44.2] AKI (acute kidney injury) ([place]) [N17.9]  Patient had a syncopal episode. Patient states she has been having diarrhea and poor PO intake. Patient was found to have C. Diff colitis. She also complains of dysphagia and odynophagia. Thought to be secondary to bisphosphonate.   Patient now s/p pacemaker placement Patient was seen today in the second floor Patient resting comfortably in the bed. Patient offers no new specific physical complaints   Objective:  Vital signs in last 24 hours:  Temp:  [97.6 F (36.4 C)-99.5 F (37.5 C)] 97.6 F (36.4 C) (04/23 0737) Pulse Rate:  [67-92] 81 (04/23 0737) Resp:  [18] 18 (04/23 0737) BP: (127-161)/(57-66) 128/57 (04/23 0737) SpO2:  [95 %-97 %] 96 % (04/23 0737) Weight:  [88 kg] 88 kg (04/23 0737)  Weight change:  Filed Weights   10/26/20 1115 10/27/20 0501 10/29/20 0737  Weight: 83.9 kg 88 kg 88 kg    Intake/Output: I/O last 3 completed shifts: In: 240 [P.O.:240] Out: -    Intake/Output this shift:  Total I/O In: 200 [P.O.:200] Out: 300 [Urine:300]  Physical Exam: General: NAD, sitting in chair  Head: Normocephalic, atraumatic. moist oral mucosal membranes  Eyes: Anicteric  Neck: Supple, trachea midline  Lungs:  Clear to auscultation  Heart: Regular rate and rhythm  Abdomen:  Soft, nontender,   Extremities: no peripheral edema.  Neurologic: Nonfocal, moving all four extremities  Skin: No lesions  Access: none    Basic Metabolic Panel: Recent Labs  Lab 10/24/20 1130 10/25/20 0518 10/26/20 0508 10/27/20 0502 10/28/20 0422 10/29/20 0511  NA 136 137 139 138 139 140  K 5.2* 4.5 4.5 3.9 3.8 3.5  CL 107 105 105 105 108 107  CO2 15* 20* 20* 22 22 21*  GLUCOSE 169* 135* 114* 147* 166* 161*  BUN 52* 53* 58* 59* 50* 44*  CREATININE 3.79* 3.24*  3.57* 2.96* 2.13* 1.81*  CALCIUM 9.1 8.5* 8.2* 8.4* 8.8* 8.9  MG 2.8*  --  2.7* 2.7* 2.5* 2.3  PHOS  --   --  4.8*  --  3.8  --     Liver Function Tests: Recent Labs  Lab 10/24/20 1130  AST 30  ALT 13  ALKPHOS 55  BILITOT 1.0  PROT 6.9  ALBUMIN 3.6   No results for input(s): LIPASE, AMYLASE in the last 168 hours. No results for input(s): AMMONIA in the last 168 hours.  CBC: Recent Labs  Lab 10/24/20 1130 10/25/20 0518 10/26/20 0620 10/27/20 0502 10/28/20 0422 10/29/20 0511  WBC 9.3 9.1 7.3 6.9 6.8 6.5  NEUTROABS 7.3  --  4.7 4.8 5.0 4.9  HGB 8.8* 7.9* 8.0* 8.5* 7.9* 8.2*  HCT 29.3* 25.2* 26.2* 27.0* 25.7* 26.3*  MCV 105.4* 100.8* 102.3* 101.9* 102.0* 100.8*  PLT 205 204 161 144* 144* 143*    Cardiac Enzymes: Recent Labs  Lab 10/25/20 0518  CKTOTAL 472*    BNP: Invalid input(s): POCBNP  CBG: Recent Labs  Lab 10/28/20 1227 10/28/20 1636 10/28/20 2059 10/29/20 0525 10/29/20 0735  GLUCAP 238* 242* 219* 164* 152*    Microbiology: Results for orders placed or performed during the hospital encounter of 10/24/20  Resp Panel by RT-PCR (Flu A&B, Covid) Nasopharyngeal Swab     Status: None   Collection Time: 10/24/20 11:24 AM   Specimen: Nasopharyngeal Swab; Nasopharyngeal(NP) swabs  in vial transport medium  Result Value Ref Range Status   SARS Coronavirus 2 by RT PCR NEGATIVE NEGATIVE Final    Comment: (NOTE) SARS-CoV-2 target nucleic acids are NOT DETECTED.  The SARS-CoV-2 RNA is generally detectable in upper respiratory specimens during the acute phase of infection. The lowest concentration of SARS-CoV-2 viral copies this assay can detect is 138 copies/mL. A negative result does not preclude SARS-Cov-2 infection and should not be used as the sole basis for treatment or other patient management decisions. A negative result may occur with  improper specimen collection/handling, submission of specimen other than nasopharyngeal swab, presence of viral  mutation(s) within the areas targeted by this assay, and inadequate number of viral copies(<138 copies/mL). A negative result must be combined with clinical observations, patient history, and epidemiological information. The expected result is Negative.  Fact Sheet for Patients:  EntrepreneurPulse.com.au  Fact Sheet for Healthcare Providers:  IncredibleEmployment.be  This test is no t yet approved or cleared by the [place] FDA and  has been authorized for detection and/or diagnosis of SARS-CoV-2 by FDA under an Emergency Use Authorization (EUA). This EUA will remain  in effect (meaning this test can be used) for the duration of the COVID-19 declaration under Section 564(b)(1) of the Act, 21 U.S.C.section 360bbb-3(b)(1), unless the authorization is terminated  or revoked sooner.       Influenza A by PCR NEGATIVE NEGATIVE Final   Influenza B by PCR NEGATIVE NEGATIVE Final    Comment: (NOTE) The Xpert Xpress SARS-CoV-2/FLU/RSV plus assay is intended as an aid in the diagnosis of influenza from Nasopharyngeal swab specimens and should not be used as a sole basis for treatment. Nasal washings and aspirates are unacceptable for Xpert Xpress SARS-CoV-2/FLU/RSV testing.  Fact Sheet for Patients: EntrepreneurPulse.com.au  Fact Sheet for Healthcare Providers: IncredibleEmployment.be  This test is not yet approved or cleared by the [place] FDA and has been authorized for detection and/or diagnosis of SARS-CoV-2 by FDA under an Emergency Use Authorization (EUA). This EUA will remain in effect (meaning this test can be used) for the duration of the COVID-19 declaration under Section 564(b)(1) of the Act, 21 U.S.C. section 360bbb-3(b)(1), unless the authorization is terminated or revoked.  Performed at [facility], [place], [place] [address]   C Difficile Quick Screen w PCR  reflex     Status: Abnormal   Collection Time: 10/24/20  4:45 PM   Specimen: STOOL  Result Value Ref Range Status   C Diff antigen POSITIVE (A) NEGATIVE Final   C Diff toxin NEGATIVE NEGATIVE Final   C Diff interpretation Results are indeterminate. See PCR results.  Final    Comment: Performed at [facility], [address] [place], [place] [address]  C. Diff by PCR, Reflexed     Status: Abnormal   Collection Time: 10/24/20  4:45 PM  Result Value Ref Range Status   Toxigenic C. Difficile by PCR POSITIVE (A) NEGATIVE Final    Comment: Positive for toxigenic C. difficile with little to no toxin production. Only treat if clinical presentation suggests symptomatic illness. Performed at [facility], [address] [place], [place] [address]   Surgical PCR screen     Status: Abnormal   Collection Time: 10/26/20  9:52 AM   Specimen: Nasal Mucosa; Nasal Swab  Result Value Ref Range Status   MRSA, PCR NEGATIVE NEGATIVE Final   Staphylococcus aureus POSITIVE (A) NEGATIVE Final    Comment: (NOTE) The Xpert SA  Assay (FDA approved for NASAL specimens in patients 90 years of age and older), is one component of a comprehensive surveillance program. It is not intended to diagnose infection nor to guide or monitor treatment. Performed at [facility], [address] [place], [place] [address]     Coagulation Studies: No results for input(s): LABPROT, INR in the last 72 hours.  Urinalysis: No results for input(s): COLORURINE, LABSPEC, PHURINE, GLUCOSEU, HGBUR, BILIRUBINUR, KETONESUR, PROTEINUR, UROBILINOGEN, NITRITE, LEUKOCYTESUR in the last 72 hours.  Invalid input(s): APPERANCEUR    Imaging: CT ABDOMEN PELVIS WO CONTRAST  Result Date: 10/27/2020 CLINICAL DATA:  Follow-up left renal lesions seen on recent ultrasound. EXAM: CT ABDOMEN AND PELVIS WITHOUT CONTRAST TECHNIQUE: Multidetector CT imaging of the abdomen and pelvis was performed following the  standard protocol without IV contrast. COMPARISON:  03/17/2018 FINDINGS: Examination is limited by respiratory motion and artifact from the arms being down by the side. Lower chest: Stable emphysematous changes and pulmonary scarring. Stable cardiac enlargement and advanced aortic and coronary artery calcifications. Pacer wires are noted. Hepatobiliary: Stable advanced cirrhotic changes involving the liver without obvious hepatic lesions without contrast. No intra or extrahepatic biliary dilatation. Pancreas: No mass, inflammation or ductal dilatation. Spleen: Mild stable splenomegaly. Adrenals/Urinary Tract: The adrenal glands are unremarkable and stable. There is a solid-appearing 2.2 cm lesion associated with the lateral cortex of the left kidney in the interpolar region. This measures 32 Hounsfield units. On the prior study from 2019 it measured approximately 16.5 mm. No worrisome right-sided renal mass. Stable prominent extra renal pelvis. No renal or obstructing ureteral calculi. No bladder calculi or bladder mass. Stomach/Bowel: The stomach, duodenum, small bowel and colon are grossly normal. Vascular/Lymphatic: Advanced atherosclerotic calcifications involving the aorta and branch vessels but no aneurysm. No mesenteric or retroperitoneal mass or adenopathy. Reproductive: The uterus and ovaries are unremarkable. Other: Moderate free abdominal/free pelvic fluid likely secondary to the patient's cirrhosis. Musculoskeletal: No significant bony findings. T12 vertebral augmentation changes are noted. IMPRESSION: 1. Stable advanced cirrhotic changes involving the liver with associated mild splenomegaly and moderate abdominal/free pelvic fluid. No focal hepatic lesions are identified without contrast. 2. Slight interval enlargement of the solid-appearing left renal lesion. 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 Recommend continued surveillance. 3. No acute abdominal/pelvic findings or  adenopathy. 4. Advanced atherosclerotic calcifications involving the aorta and branch vessels. 5. Stable emphysematous changes and pulmonary scarring at the lung bases. 6. Emphysema and aortic atherosclerosis. Aortic Atherosclerosis (ICD10-I70.0) and Emphysema (ICD10-J43.9). Electronically Signed   By: Marijo Sanes M.D.   On: 10/27/2020 15:38     Medications:    . aspirin EC  81 mg Oral Daily  . Chlorhexidine Gluconate Cloth  6 each Topical Q0600  . cholecalciferol  2,000 Units Oral Daily  . feeding supplement  237 mL Oral BID BM  . Ferrous Fumarate  1 tablet Oral BID  . hydrocortisone cream   Topical QID  . hydrOXYzine  10 mg Oral QHS  . insulin aspart  0-15 Units Subcutaneous TID WC  . latanoprost  1 drop Left Eye QHS  . loratadine  10 mg Oral Daily  . magnesium oxide  800 mg Oral Daily  . niacin  500 mg Oral QHS  . omega-3 acid ethyl esters  1 g Oral Daily  . pantoprazole  40 mg Oral Daily  . sodium bicarbonate  650 mg Oral TID  . sodium chloride flush  3 mL Intravenous Q12H  . vancomycin  125 mg Oral QID  . vitamin B-12  1,000 mcg Oral Daily   acetaminophen **OR** acetaminophen, acetaminophen, ondansetron **OR** ondansetron (ZOFRAN) IV, ondansetron (ZOFRAN) IV  Assessment/ Plan:  [name] is a 79 y.o. white female with hypertension, pulmonary hypertension, insulin dependent diabetes mellitus type II, hyperlipidemia, anemia, hepatic cirrhosis, gout, left renal mass, osteoporosis, glaucoma, rheumatoid arthritis, GERD, history of breast cancer status post right mastectomy.   1)Renal    Acute kidney injury on chronic kidney disease stage IIIB with proteinuria.  Baseline creatinine of 1.3, GFR of 40 on 08/24/20.  Pt was Anuric on  admission Chronic kidney disease secondary to diabetic nephropathy.  Acute kidney injury secondary to ATN from prerenal azotemia.  No IV contrast exposure.  Now better  Creat 3.8==> 1.8     2)HTN  Hypertension with bradycardia.  - not  on a beta blocker or calcium channel blocker.  - Appreciate cardiology input. [facility] scheduled.  - echocardiogram EF 65-70%, right and left atrial mildly dilated, mitral valve degeneration and Trivial regurgitation.    Blood pressure is stable    3)Anemia of chronic disease  CBC Latest Ref Rng & Units 10/29/2020 10/28/2020 10/27/2020  WBC 4.0 - 10.5 K/uL 6.5 6.8 6.9  Hemoglobin 12.0 - 15.0 g/dL 8.2(L) 7.9(L) 8.5(L)  Hematocrit 36.0 - 46.0 % 26.3(L) 25.7(L) 27.0(L)  Platelets 150 - 400 K/uL 143(L) 144(L) 144(L)       HGb is not at goal (9--11)  but stable   4) Secondary hyperparathyroidism -CKD Mineral-Bone Disorder    Lab Results  Component Value Date   CALCIUM 8.9 10/29/2020   PHOS 3.8 10/28/2020    Secondary Hyperparathyroidism  present Phosphorus is now at goal.   5)Left renal mass: ultrasound from 10/14/2020 reviewed. Increased in size since 2021.  Follows by urology as outpatient.      6) Electrolytes   BMP Latest Ref Rng & Units 10/29/2020 10/28/2020 10/27/2020  Glucose 70 - 99 mg/dL 161(H) 166(H) 147(H)  BUN 8 - 23 mg/dL 44(H) 50(H) 59(H)  Creatinine 0.44 - 1.00 mg/dL 1.81(H) 2.13(H) 2.96(H)  BUN/Creat Ratio 12 - 28 - - -  Sodium 135 - 145 mmol/L 140 139 138  Potassium 3.5 - 5.1 mmol/L 3.5 3.8 3.9  Chloride 98 - 111 mmol/L 107 108 105  CO2 22 - 32 mmol/L 21(L) 22 22  Calcium 8.9 - 10.3 mg/dL 8.9 8.8(L) 8.4(L)     Sodium Normonatremic   Potassium Normokalemic    7)Acid base   Acidosis Co2 is just at goal  8) Third  degree AV block Patient now s/p placement placement Cardiology following    Plan    AKI is better Educated patient and family about the renal related issues     LOS: 4 Ules Marsala s [facility] 4/23/202211:00 AM

## 2020-10-29 NOTE — Discharge Summary (Addendum)
Physician Discharge Summary  Patient ID: Emily Faulkner MRN: [id number] DOB/AGE: 1941/03/14 80 y.o.  Admit date: 10/24/2020 Discharge date: 10/29/2020  Admission Diagnoses:  Discharge Diagnoses:  Principal Problem:   Syncope and collapse Active Problems:   Hypertension   AKI (acute kidney injury) ([place])   Complete heart block (HCC)   Hyperkalemia   Odynophagia Moderate tricuspid regurgitation.  Discharged Condition: fair  Hospital Course:  Emily Faulkner a 80 y.o.femalewith medical history significant fordiabetes mellitus with complications of stage III chronic kidney disease, hypertension, pulmonary hypertension and dyslipidemia who presents to the emergency room by EMS for evaluation of a syncopal episode. Patient also has been having poor appetite, nausea vomiting and frequent diarrhea for the last few days. Upon arriving the hospital, C. difficile toxin was positive. Was also found to have third-degree AV block while in the ER, improved after giving fluids. Patient also hasacute kidney injury due to dehydration. Pacemaker placed on4/20.   #1. Syncope and collapse. Transient third-degree AV block. Status post pacemaker placement, doing well.  #2. Acute kidney injury on chronic kidney disease stage IIIb secondary to dehydration. Anion gap metabolic acidosis. Hyperkalemia C. difficile colitis. Hypoxia. Hypoxia resolved.  Patient diarrhea is slowing down, continue vancomycin for total course of 10 days. Patient also has a better appetite, she is making urine, renal function improving. Diarrhea also resolved, she did not have a bowel movement in 24 hours.  Currently she is medically stable to be discharged   3.  Type 2 diabetes. Continue current regimen.  4.  Anemia of chronic disease. Thrombocytopenia. Stable.  #5 Acute right ankle fracture. Patient had right fibular fracture to distal area.  No need for surgery.  Follow-up with orthopedics as  outpatient.  #6. Hepatitis B. This is a new finding this hospitalization, CT scan did not show significant liver cirrhosis.  Patient can be followed with PCP as outpatient.  #7. Renal mass. Likely malignant, discussed with patient and son, they are in the process of follow-up with urology for options.  System has identified patient has high risk for mortality within 12 months due to multiple medical problems, please refer to palliative care as outpatient.  Consults: cardiology, ID and nephrology  Significant Diagnostic Studies:  CT ABDOMEN AND PELVIS WITHOUT CONTRAST  TECHNIQUE: Multidetector CT imaging of the abdomen and pelvis was performed following the standard protocol without IV contrast.  COMPARISON:  03/17/2018  FINDINGS: Examination is limited by respiratory motion and artifact from the arms being down by the side.  Lower chest: Stable emphysematous changes and pulmonary scarring. Stable cardiac enlargement and advanced aortic and coronary artery calcifications. Pacer wires are noted.  Hepatobiliary: Stable advanced cirrhotic changes involving the liver without obvious hepatic lesions without contrast. No intra or extrahepatic biliary dilatation.  Pancreas: No mass, inflammation or ductal dilatation.  Spleen: Mild stable splenomegaly.  Adrenals/Urinary Tract: The adrenal glands are unremarkable and stable.  There is a solid-appearing 2.2 cm lesion associated with the lateral cortex of the left kidney in the interpolar region. This measures 32 Hounsfield units. On the prior study from 2019 it measured approximately 16.5 mm.  No worrisome right-sided renal mass. Stable prominent extra renal pelvis. No renal or obstructing ureteral calculi. No bladder calculi or bladder mass.  Stomach/Bowel: The stomach, duodenum, small bowel and colon are grossly normal.  Vascular/Lymphatic: Advanced atherosclerotic calcifications involving the aorta and branch  vessels but no aneurysm. No mesenteric or retroperitoneal mass or adenopathy.  Reproductive: The uterus and ovaries are unremarkable.  Other: Moderate free abdominal/free pelvic fluid likely secondary to the patient's cirrhosis.  Musculoskeletal: No significant bony findings. T12 vertebral augmentation changes are noted.  IMPRESSION: 1. Stable advanced cirrhotic changes involving the liver with associated mild splenomegaly and moderate abdominal/free pelvic fluid. No focal hepatic lesions are identified without contrast. 2. Slight interval enlargement of the solid-appearing left renal lesion. 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 Recommend continued surveillance. 3. No acute abdominal/pelvic findings or adenopathy. 4. Advanced atherosclerotic calcifications involving the aorta and branch vessels. 5. Stable emphysematous changes and pulmonary scarring at the lung bases. 6. Emphysema and aortic atherosclerosis.  Aortic Atherosclerosis (ICD10-I70.0) and Emphysema (ICD10-J43.9).   Electronically Signed   By: Marijo Sanes M.D.   On: 10/27/2020 15:38  RIGHT ANKLE - 2 VIEW  COMPARISON:  None.  FINDINGS: Two-view exam shows no dislocation. Lateral film suggests the presence of a cortical step-off along the posterior cortex of the distal fibula, projecting through the tibia. No associated subluxation.  IMPRESSION: Lateral film suggests a nondisplaced fracture of the distal fibula. Correlation for point tenderness in this region recommended. Dedicated three-view exam or CT imaging could be used to further evaluate as clinically warranted.   Electronically Signed   By: Misty Stanley M.D.   On: 10/26/2020 16:11  1. Left ventricular ejection fraction, by estimation, is 65 to 70%. The left ventricle has normal function. The left ventricle has no regional wall motion abnormalities. Left ventricular diastolic parameters were normal. 2.  Right ventricular systolic function is normal. The right ventricular size is normal. 3. Left atrial size was mildly dilated. 4. Right atrial size was mildly dilated. 5. The mitral valve is degenerative. Trivial mitral valve regurgitation. 6. Tricuspid valve regurgitation is moderate. 7. The aortic valve was not well visualized. Aortic valve regurgitation is trivial. Mild to moderate aortic valve sclerosis/calcification is present, without any evidence of aortic stenosis.   Treatments: Pacemaker, IVF, oral vancomycine  Discharge Exam: Blood pressure (!) 128/57, pulse 81, temperature 97.6 F (36.4 C), resp. rate 18, height 5' 4"  (1.626 m), weight 88 kg, SpO2 96 %. General appearance: alert and cooperative Resp: clear to auscultation bilaterally Cardio: regular rate and rhythm, S1, S2 normal, no murmur, click, rub or gallop GI: soft, non-tender; bowel sounds normal; no masses,  no organomegaly Extremities: extremities normal, atraumatic, no cyanosis or edema  Disposition: Discharge disposition: 01-Home or Self Care       Discharge Instructions    Diet - low sodium heart healthy   Complete by: As directed    Increase activity slowly   Complete by: As directed      Allergies as of 10/29/2020   No Known Allergies     Medication List    STOP taking these medications   alendronate 70 MG tablet Commonly known as: FOSAMAX     TAKE these medications   Accu-Chek Aviva Plus test strip Generic drug: glucose blood   Accu-Chek Softclix Lancets lancets once daily Use as instructed.   Accu-Chek Softclix Lancets lancets ONCE DAILY USE AS INSTRUCTED.   aspirin 81 MG EC tablet Take 81 mg by mouth daily.   BD AutoShield Duo 30G X 5 MM Misc Generic drug: Insulin Pen Needle   Insulin Pen Needle 32G X 4 MM Misc USE 1 NEEDLE SUBCUTANEOUSLY ONCE A DAY AS DIRECTED   BD Pen Needle Nano U/F 32G X 4 MM Misc Generic drug: Insulin Pen Needle   clindamycin 1 % external  solution Commonly known as:  CLEOCIN T Apply 1 application topically 2 (two) times daily.   clobetasol 0.05 % external solution Commonly known as: TEMOVATE Apply 1 application topically every evening. (apply to scalp)   colchicine 0.6 MG tablet Take 2 tabs PO x 1, then 1 tab PO 1 hour later x 1  Max: 1.8 mg total dose per attack, do not repeat within 3 days.   feeding supplement Liqd Take 237 mLs by mouth 2 (two) times daily between meals.   ferrous fumarate 325 (106 Fe) MG Tabs tablet Commonly known as: HEMOCYTE - 106 mg FE Take 1 tablet (106 mg of iron total) by mouth 2 (two) times daily.   Fish Oil 1000 MG Caps Take 1 capsule by mouth.   glipiZIDE 10 MG tablet Commonly known as: GLUCOTROL Take 10 mg by mouth daily.   hydrOXYzine 10 MG tablet Commonly known as: ATARAX/VISTARIL Take 10 mg by mouth at bedtime.   insulin glargine 100 UNIT/ML Solostar Pen Commonly known as: LANTUS Inject 34 Units into the skin daily. (may take up to 50u based upon blood glucose reading)   ketoconazole 2 % shampoo Commonly known as: NIZORAL Apply 1 application topically daily. (apply to scalp and wait at least 30 minutes before rinsing)   ketoconazole 2 % cream Commonly known as: NIZORAL   Lidocaine 4 % Ptch Place onto the skin.   loratadine 10 MG tablet Commonly known as: CLARITIN Take 10 mg by mouth daily.   losartan 50 MG tablet Commonly known as: COZAAR TAKE 1/2 TABLET (25MG) BY MOUTH EVERY DAY   Magnesium Oxide 400 (240 Mg) MG Tabs TAKE 2 TABLETS (800 MG TOTAL) BY MOUTH DAILY.   metFORMIN 500 MG 24 hr tablet Commonly known as: GLUCOPHAGE-XR   methocarbamol 500 MG tablet Commonly known as: ROBAXIN Take 500-1,500 mg by mouth See admin instructions. On day 1-3 take 1500 mg by mouth three times daily, on days 4-6 take 1000 mg by mouth three times daily, then take 1000 mg by mouth three times daily as needed.   niacin 500 MG CR tablet Commonly known as: NIASPAN Take 500 mg  by mouth every evening.   pantoprazole 40 MG tablet Commonly known as: Protonix Take 1 tablet (40 mg total) by mouth daily.   potassium chloride SA 20 MEQ tablet Commonly known as: KLOR-CON Take 20 mEq by mouth 2 (two) times daily.   simvastatin 20 MG tablet Commonly known as: ZOCOR Take 20 mg by mouth at bedtime.   sitaGLIPtin 50 MG tablet Commonly known as: JANUVIA Take 50 mg by mouth daily.   sodium bicarbonate 650 MG tablet Take 1 tablet (650 mg total) by mouth 3 (three) times daily for 7 days.   traMADol 50 MG tablet Commonly known as: Ultram Take 1 tablet (50 mg total) by mouth 2 (two) times daily. What changed: Another medication with the same name was removed. Continue taking this medication, and follow the directions you see here.   Travatan Z 0.004 % Soln ophthalmic solution Generic drug: Travoprost (BAK Free) Place 1 drop into the left eye at bedtime.   vancomycin 50 mg/mL  oral solution Commonly known as: VANCOCIN Take 2.5 mLs (125 mg total) by mouth 4 (four) times daily for 6 days.   vitamin B-12 1000 MCG tablet Commonly known as: CYANOCOBALAMIN Take 1,000 mcg by mouth daily.   Vitamin D2 50 MCG (2000 UT) Tabs Take 1 tablet by mouth daily.       Follow-up Information    Thereasa Distance  E, MD Follow up in 1 week(s).   Specialty: Family Medicine Contact information: [place] [address] [phone]        Poggi, Marshall Cork, MD Follow up in 2 week(s).   Specialty: Orthopedic Surgery Contact information: [place] [address] [phone]        Lavonia Dana, MD Follow up in 2 week(s).   Specialty: Nephrology Contact information: [address] [place] [address] [phone]        Teodoro Spray, MD Follow up in 2 week(s).   Specialty: Cardiology Contact information: [place]  [id number] [phone]              35 minutes Signed: Sharen Hones 10/29/2020, 9:45 AM

## 2020-10-29 NOTE — Progress Notes (Signed)
[facility] Cardiology    SUBJECTIVE: Patient resting comfortably in the room asleep appears not to be in any distress breathing adequately pacer site looks okay and well bandaged   Vitals:   10/28/20 2056 10/29/20 0356 10/29/20 0737 10/29/20 1118  BP: (!) 147/57 (!) 127/57 (!) 128/57 (!) 138/55  Pulse: 67 92 81 89  Resp: 18 18 18 18   Temp: 99.5 F (37.5 C) 98 F (36.7 C) 97.6 F (36.4 C) 97.7 F (36.5 C)  TempSrc: Oral     SpO2: 96% 96% 96% 97%  Weight:   88 kg   Height:         Intake/Output Summary (Last 24 hours) at 10/29/2020 1414 Last data filed at 10/29/2020 8786 Gross per 24 hour  Intake 440 ml  Output 300 ml  Net 140 ml      PHYSICAL EXAM  General: Well developed, well nourished, in no acute distress HEENT:  Normocephalic and atramatic Neck:  No JVD.  Lungs: Clear bilaterally to auscultation and percussion. Heart: HRRR . Normal S1 and S2 without gallops or murmurs.  Abdomen: Bowel sounds are positive, abdomen soft and non-tender  Msk:  Back normal, normal gait. Normal strength and tone for age. Extremities: No clubbing, cyanosis or edema.   Neuro: Alert and oriented X 3. Psych:  Good affect, responds appropriately   LABS: Basic Metabolic Panel: Recent Labs    10/28/20 0422 10/29/20 0511  NA 139 140  K 3.8 3.5  CL 108 107  CO2 22 21*  GLUCOSE 166* 161*  BUN 50* 44*  CREATININE 2.13* 1.81*  CALCIUM 8.8* 8.9  MG 2.5* 2.3  PHOS 3.8  --    Liver Function Tests: No results for input(s): AST, ALT, ALKPHOS, BILITOT, PROT, ALBUMIN in the last 72 hours. No results for input(s): LIPASE, AMYLASE in the last 72 hours. CBC: Recent Labs    10/28/20 0422 10/29/20 0511  WBC 6.8 6.5  NEUTROABS 5.0 4.9  HGB 7.9* 8.2*  HCT 25.7* 26.3*  MCV 102.0* 100.8*  PLT 144* 143*   Cardiac Enzymes: No results for input(s): CKTOTAL, CKMB, CKMBINDEX, TROPONINI in the last 72 hours. BNP: Invalid input(s): POCBNP D-Dimer: No results for input(s): DDIMER in the last 72  hours. Hemoglobin A1C: No results for input(s): HGBA1C in the last 72 hours. Fasting Lipid Panel: No results for input(s): CHOL, HDL, LDLCALC, TRIG, CHOLHDL, LDLDIRECT in the last 72 hours. Thyroid Function Tests: No results for input(s): TSH, T4TOTAL, T3FREE, THYROIDAB in the last 72 hours.  Invalid input(s): FREET3 Anemia Panel: No results for input(s): VITAMINB12, FOLATE, FERRITIN, TIBC, IRON, RETICCTPCT in the last 72 hours.  No results found.   Echo preserved left ventricular function EF of 60%  TELEMETRY: Paced rhythm at 70  ASSESSMENT AND PLAN:  Principal Problem:   Syncope and collapse Active Problems:   Hypertension   AKI (acute kidney injury) ([place])   Complete heart block (HCC)   Hyperkalemia   Odynophagia C. difficile  Plan Continue current management status post permanent pacemaker appears to be functioning adequately follow-up with cardiology 1 to 2 weeks Continue hypertension management and control Agree with nephrology input for renal insufficiency Echocardiogram suggest [place] of ventricular function Maintain adequate hydration Stable from a cardiac standpoint Patient follow-up with cardiology 1 to 2 weeks for pacemaker follow-up Continue current therapy for C. difficile with antibiotics vancomycin and Flagyl  Yolonda Kida, MD 10/29/2020 2:14 PM

## 2020-10-29 NOTE — TOC Transition Note (Signed)
Transition of Care [facility]) - CM/SW Discharge Note   Patient Details  Name: Emily Faulkner MRN: [id number] Date of Birth: 06-07-1941  [facility] of [facility]) CM/SW Contact:  Izola Price, RN Phone Number: 10/29/2020, 2:19 PM   Clinical Narrative: 4/23: Patient being discharge with [facility] services PT/OT/RN confirmed by [place] [facility] at [facility]). No DME ordered. Simmie Davies RN CM 230 pm.      Final next level of care: [place] Barriers to Discharge: Continued Medical Work up   Patient Goals and CMS Choice Patient states their goals for this hospitalization and ongoing recovery are:: to get better   Choice offered to / list presented to : [place]  Discharge Placement                       Discharge Plan and Services In-house Referral: NA Discharge Planning Services: NA Post Acute Care Choice: Home Health            DME Agency: NA       HH Arranged: PT,OT,RN [place] Agency: [facility] [employer] Date [place]: 10/28/20 Time [place]: 1604 Representative spoke with at [place]: [place]  Social Determinants of Health ([place]) Interventions     Readmission Risk Interventions Readmission Risk Prevention Plan 10/28/2020 03/21/2018  Transportation Screening Complete Complete  PCP or Specialist Appt within 5-7 Days - Complete  PCP or Specialist Appt within 3-5 Days Complete -  Home Care Screening - Complete  Medication Review (RN CM) - Complete  HRI or Home Care Consult Complete -  Social Work Consult for [place] Planning/Counseling Complete -  Palliative Care Screening Not Applicable -  Medication Review Press photographer) Complete -  Some recent data might be hidden

## 2020-11-03 DIAGNOSIS — E1140 Type 2 diabetes mellitus with diabetic neuropathy, unspecified: Secondary | ICD-10-CM | POA: Diagnosis not present

## 2020-11-03 DIAGNOSIS — N179 Acute kidney failure, unspecified: Secondary | ICD-10-CM | POA: Diagnosis not present

## 2020-11-03 DIAGNOSIS — Z48812 Encounter for surgical aftercare following surgery on the circulatory system: Secondary | ICD-10-CM | POA: Diagnosis not present

## 2020-11-03 DIAGNOSIS — I495 Sick sinus syndrome: Secondary | ICD-10-CM | POA: Diagnosis not present

## 2020-11-03 DIAGNOSIS — I442 Atrioventricular block, complete: Secondary | ICD-10-CM | POA: Diagnosis not present

## 2020-11-03 DIAGNOSIS — N1832 Chronic kidney disease, stage 3b: Secondary | ICD-10-CM | POA: Diagnosis not present

## 2020-11-03 DIAGNOSIS — D631 Anemia in chronic kidney disease: Secondary | ICD-10-CM | POA: Diagnosis not present

## 2020-11-03 DIAGNOSIS — I1310 Hypertensive heart and chronic kidney disease without heart failure, with stage 1 through stage 4 chronic kidney disease, or unspecified chronic kidney disease: Secondary | ICD-10-CM | POA: Diagnosis not present

## 2020-11-03 DIAGNOSIS — E1122 Type 2 diabetes mellitus with diabetic chronic kidney disease: Secondary | ICD-10-CM | POA: Diagnosis not present

## 2020-11-04 DIAGNOSIS — I442 Atrioventricular block, complete: Secondary | ICD-10-CM | POA: Diagnosis not present

## 2020-11-04 DIAGNOSIS — N179 Acute kidney failure, unspecified: Secondary | ICD-10-CM | POA: Diagnosis not present

## 2020-11-04 DIAGNOSIS — D631 Anemia in chronic kidney disease: Secondary | ICD-10-CM | POA: Diagnosis not present

## 2020-11-04 DIAGNOSIS — E1122 Type 2 diabetes mellitus with diabetic chronic kidney disease: Secondary | ICD-10-CM | POA: Diagnosis not present

## 2020-11-04 DIAGNOSIS — E1140 Type 2 diabetes mellitus with diabetic neuropathy, unspecified: Secondary | ICD-10-CM | POA: Diagnosis not present

## 2020-11-04 DIAGNOSIS — N1832 Chronic kidney disease, stage 3b: Secondary | ICD-10-CM | POA: Diagnosis not present

## 2020-11-04 DIAGNOSIS — I495 Sick sinus syndrome: Secondary | ICD-10-CM | POA: Diagnosis not present

## 2020-11-04 DIAGNOSIS — I1310 Hypertensive heart and chronic kidney disease without heart failure, with stage 1 through stage 4 chronic kidney disease, or unspecified chronic kidney disease: Secondary | ICD-10-CM | POA: Diagnosis not present

## 2020-11-04 DIAGNOSIS — Z48812 Encounter for surgical aftercare following surgery on the circulatory system: Secondary | ICD-10-CM | POA: Diagnosis not present

## 2020-11-07 DIAGNOSIS — I442 Atrioventricular block, complete: Secondary | ICD-10-CM | POA: Diagnosis not present

## 2020-11-07 DIAGNOSIS — R600 Localized edema: Secondary | ICD-10-CM | POA: Diagnosis not present

## 2020-11-07 DIAGNOSIS — S82831A Other fracture of upper and lower end of right fibula, initial encounter for closed fracture: Secondary | ICD-10-CM | POA: Diagnosis not present

## 2020-11-07 DIAGNOSIS — N179 Acute kidney failure, unspecified: Secondary | ICD-10-CM | POA: Diagnosis not present

## 2020-11-07 DIAGNOSIS — A0472 Enterocolitis due to Clostridium difficile, not specified as recurrent: Secondary | ICD-10-CM | POA: Diagnosis not present

## 2020-11-07 DIAGNOSIS — D649 Anemia, unspecified: Secondary | ICD-10-CM | POA: Diagnosis not present

## 2020-11-08 ENCOUNTER — Other Ambulatory Visit (HOSPITAL_COMMUNITY): Payer: Self-pay

## 2020-11-08 DIAGNOSIS — I442 Atrioventricular block, complete: Secondary | ICD-10-CM | POA: Diagnosis not present

## 2020-11-08 DIAGNOSIS — D631 Anemia in chronic kidney disease: Secondary | ICD-10-CM | POA: Diagnosis not present

## 2020-11-08 DIAGNOSIS — N179 Acute kidney failure, unspecified: Secondary | ICD-10-CM | POA: Diagnosis not present

## 2020-11-08 DIAGNOSIS — E1122 Type 2 diabetes mellitus with diabetic chronic kidney disease: Secondary | ICD-10-CM | POA: Diagnosis not present

## 2020-11-08 DIAGNOSIS — Z48812 Encounter for surgical aftercare following surgery on the circulatory system: Secondary | ICD-10-CM | POA: Diagnosis not present

## 2020-11-08 DIAGNOSIS — E1140 Type 2 diabetes mellitus with diabetic neuropathy, unspecified: Secondary | ICD-10-CM | POA: Diagnosis not present

## 2020-11-08 DIAGNOSIS — I495 Sick sinus syndrome: Secondary | ICD-10-CM | POA: Diagnosis not present

## 2020-11-08 DIAGNOSIS — N1832 Chronic kidney disease, stage 3b: Secondary | ICD-10-CM | POA: Diagnosis not present

## 2020-11-08 DIAGNOSIS — I1310 Hypertensive heart and chronic kidney disease without heart failure, with stage 1 through stage 4 chronic kidney disease, or unspecified chronic kidney disease: Secondary | ICD-10-CM | POA: Diagnosis not present

## 2020-11-10 DIAGNOSIS — R001 Bradycardia, unspecified: Secondary | ICD-10-CM | POA: Insufficient documentation

## 2020-11-10 DIAGNOSIS — E1159 Type 2 diabetes mellitus with other circulatory complications: Secondary | ICD-10-CM | POA: Diagnosis not present

## 2020-11-10 DIAGNOSIS — E1169 Type 2 diabetes mellitus with other specified complication: Secondary | ICD-10-CM | POA: Diagnosis not present

## 2020-11-10 DIAGNOSIS — I25118 Atherosclerotic heart disease of native coronary artery with other forms of angina pectoris: Secondary | ICD-10-CM | POA: Diagnosis not present

## 2020-11-10 DIAGNOSIS — I2510 Atherosclerotic heart disease of native coronary artery without angina pectoris: Secondary | ICD-10-CM | POA: Insufficient documentation

## 2020-11-10 DIAGNOSIS — M059 Rheumatoid arthritis with rheumatoid factor, unspecified: Secondary | ICD-10-CM | POA: Diagnosis not present

## 2020-11-10 DIAGNOSIS — R778 Other specified abnormalities of plasma proteins: Secondary | ICD-10-CM | POA: Diagnosis not present

## 2020-11-10 DIAGNOSIS — N1832 Chronic kidney disease, stage 3b: Secondary | ICD-10-CM | POA: Diagnosis not present

## 2020-11-10 DIAGNOSIS — D508 Other iron deficiency anemias: Secondary | ICD-10-CM | POA: Diagnosis not present

## 2020-11-10 DIAGNOSIS — I152 Hypertension secondary to endocrine disorders: Secondary | ICD-10-CM | POA: Diagnosis not present

## 2020-11-10 DIAGNOSIS — E785 Hyperlipidemia, unspecified: Secondary | ICD-10-CM | POA: Diagnosis not present

## 2020-11-11 DIAGNOSIS — I1310 Hypertensive heart and chronic kidney disease without heart failure, with stage 1 through stage 4 chronic kidney disease, or unspecified chronic kidney disease: Secondary | ICD-10-CM | POA: Diagnosis not present

## 2020-11-11 DIAGNOSIS — N1832 Chronic kidney disease, stage 3b: Secondary | ICD-10-CM | POA: Diagnosis not present

## 2020-11-11 DIAGNOSIS — D631 Anemia in chronic kidney disease: Secondary | ICD-10-CM | POA: Diagnosis not present

## 2020-11-11 DIAGNOSIS — E1140 Type 2 diabetes mellitus with diabetic neuropathy, unspecified: Secondary | ICD-10-CM | POA: Diagnosis not present

## 2020-11-11 DIAGNOSIS — I495 Sick sinus syndrome: Secondary | ICD-10-CM | POA: Diagnosis not present

## 2020-11-11 DIAGNOSIS — N179 Acute kidney failure, unspecified: Secondary | ICD-10-CM | POA: Diagnosis not present

## 2020-11-11 DIAGNOSIS — I442 Atrioventricular block, complete: Secondary | ICD-10-CM | POA: Diagnosis not present

## 2020-11-11 DIAGNOSIS — Z48812 Encounter for surgical aftercare following surgery on the circulatory system: Secondary | ICD-10-CM | POA: Diagnosis not present

## 2020-11-11 DIAGNOSIS — E1122 Type 2 diabetes mellitus with diabetic chronic kidney disease: Secondary | ICD-10-CM | POA: Diagnosis not present

## 2020-11-14 DIAGNOSIS — N1832 Chronic kidney disease, stage 3b: Secondary | ICD-10-CM | POA: Diagnosis not present

## 2020-11-14 DIAGNOSIS — N179 Acute kidney failure, unspecified: Secondary | ICD-10-CM | POA: Diagnosis not present

## 2020-11-14 DIAGNOSIS — I1310 Hypertensive heart and chronic kidney disease without heart failure, with stage 1 through stage 4 chronic kidney disease, or unspecified chronic kidney disease: Secondary | ICD-10-CM | POA: Diagnosis not present

## 2020-11-14 DIAGNOSIS — Z48812 Encounter for surgical aftercare following surgery on the circulatory system: Secondary | ICD-10-CM | POA: Diagnosis not present

## 2020-11-14 DIAGNOSIS — I495 Sick sinus syndrome: Secondary | ICD-10-CM | POA: Diagnosis not present

## 2020-11-14 DIAGNOSIS — D631 Anemia in chronic kidney disease: Secondary | ICD-10-CM | POA: Diagnosis not present

## 2020-11-14 DIAGNOSIS — E1122 Type 2 diabetes mellitus with diabetic chronic kidney disease: Secondary | ICD-10-CM | POA: Diagnosis not present

## 2020-11-14 DIAGNOSIS — E1140 Type 2 diabetes mellitus with diabetic neuropathy, unspecified: Secondary | ICD-10-CM | POA: Diagnosis not present

## 2020-11-14 DIAGNOSIS — I442 Atrioventricular block, complete: Secondary | ICD-10-CM | POA: Diagnosis not present

## 2020-11-16 DIAGNOSIS — I1310 Hypertensive heart and chronic kidney disease without heart failure, with stage 1 through stage 4 chronic kidney disease, or unspecified chronic kidney disease: Secondary | ICD-10-CM | POA: Diagnosis not present

## 2020-11-16 DIAGNOSIS — N179 Acute kidney failure, unspecified: Secondary | ICD-10-CM | POA: Diagnosis not present

## 2020-11-16 DIAGNOSIS — N1832 Chronic kidney disease, stage 3b: Secondary | ICD-10-CM | POA: Diagnosis not present

## 2020-11-16 DIAGNOSIS — D631 Anemia in chronic kidney disease: Secondary | ICD-10-CM | POA: Diagnosis not present

## 2020-11-16 DIAGNOSIS — I442 Atrioventricular block, complete: Secondary | ICD-10-CM | POA: Diagnosis not present

## 2020-11-16 DIAGNOSIS — E1140 Type 2 diabetes mellitus with diabetic neuropathy, unspecified: Secondary | ICD-10-CM | POA: Diagnosis not present

## 2020-11-16 DIAGNOSIS — I495 Sick sinus syndrome: Secondary | ICD-10-CM | POA: Diagnosis not present

## 2020-11-16 DIAGNOSIS — E1122 Type 2 diabetes mellitus with diabetic chronic kidney disease: Secondary | ICD-10-CM | POA: Diagnosis not present

## 2020-11-16 DIAGNOSIS — Z48812 Encounter for surgical aftercare following surgery on the circulatory system: Secondary | ICD-10-CM | POA: Diagnosis not present

## 2020-11-16 HISTORY — DX: Acute kidney failure, unspecified: N17.9

## 2020-11-17 DIAGNOSIS — N1832 Chronic kidney disease, stage 3b: Secondary | ICD-10-CM | POA: Diagnosis not present

## 2020-11-17 DIAGNOSIS — E1122 Type 2 diabetes mellitus with diabetic chronic kidney disease: Secondary | ICD-10-CM | POA: Diagnosis not present

## 2020-11-17 DIAGNOSIS — I10 Essential (primary) hypertension: Secondary | ICD-10-CM | POA: Diagnosis not present

## 2020-11-17 DIAGNOSIS — N179 Acute kidney failure, unspecified: Secondary | ICD-10-CM | POA: Diagnosis not present

## 2020-11-18 DIAGNOSIS — D631 Anemia in chronic kidney disease: Secondary | ICD-10-CM | POA: Diagnosis not present

## 2020-11-18 DIAGNOSIS — I442 Atrioventricular block, complete: Secondary | ICD-10-CM | POA: Diagnosis not present

## 2020-11-18 DIAGNOSIS — N1832 Chronic kidney disease, stage 3b: Secondary | ICD-10-CM | POA: Diagnosis not present

## 2020-11-18 DIAGNOSIS — E1140 Type 2 diabetes mellitus with diabetic neuropathy, unspecified: Secondary | ICD-10-CM | POA: Diagnosis not present

## 2020-11-18 DIAGNOSIS — I1310 Hypertensive heart and chronic kidney disease without heart failure, with stage 1 through stage 4 chronic kidney disease, or unspecified chronic kidney disease: Secondary | ICD-10-CM | POA: Diagnosis not present

## 2020-11-18 DIAGNOSIS — E1122 Type 2 diabetes mellitus with diabetic chronic kidney disease: Secondary | ICD-10-CM | POA: Diagnosis not present

## 2020-11-18 DIAGNOSIS — N179 Acute kidney failure, unspecified: Secondary | ICD-10-CM | POA: Diagnosis not present

## 2020-11-18 DIAGNOSIS — I495 Sick sinus syndrome: Secondary | ICD-10-CM | POA: Diagnosis not present

## 2020-11-18 DIAGNOSIS — Z48812 Encounter for surgical aftercare following surgery on the circulatory system: Secondary | ICD-10-CM | POA: Diagnosis not present

## 2020-11-21 DIAGNOSIS — S82821D Torus fracture of lower end of right fibula, subsequent encounter for fracture with routine healing: Secondary | ICD-10-CM | POA: Diagnosis not present

## 2020-11-21 DIAGNOSIS — M25571 Pain in right ankle and joints of right foot: Secondary | ICD-10-CM | POA: Diagnosis not present

## 2020-11-22 DIAGNOSIS — N1832 Chronic kidney disease, stage 3b: Secondary | ICD-10-CM | POA: Diagnosis not present

## 2020-11-22 DIAGNOSIS — E1122 Type 2 diabetes mellitus with diabetic chronic kidney disease: Secondary | ICD-10-CM | POA: Diagnosis not present

## 2020-11-22 DIAGNOSIS — I1310 Hypertensive heart and chronic kidney disease without heart failure, with stage 1 through stage 4 chronic kidney disease, or unspecified chronic kidney disease: Secondary | ICD-10-CM | POA: Diagnosis not present

## 2020-11-22 DIAGNOSIS — D631 Anemia in chronic kidney disease: Secondary | ICD-10-CM | POA: Diagnosis not present

## 2020-11-22 DIAGNOSIS — I495 Sick sinus syndrome: Secondary | ICD-10-CM | POA: Diagnosis not present

## 2020-11-22 DIAGNOSIS — E1140 Type 2 diabetes mellitus with diabetic neuropathy, unspecified: Secondary | ICD-10-CM | POA: Diagnosis not present

## 2020-11-22 DIAGNOSIS — Z48812 Encounter for surgical aftercare following surgery on the circulatory system: Secondary | ICD-10-CM | POA: Diagnosis not present

## 2020-11-22 DIAGNOSIS — I442 Atrioventricular block, complete: Secondary | ICD-10-CM | POA: Diagnosis not present

## 2020-11-22 DIAGNOSIS — N179 Acute kidney failure, unspecified: Secondary | ICD-10-CM | POA: Diagnosis not present

## 2020-11-23 DIAGNOSIS — I1310 Hypertensive heart and chronic kidney disease without heart failure, with stage 1 through stage 4 chronic kidney disease, or unspecified chronic kidney disease: Secondary | ICD-10-CM | POA: Diagnosis not present

## 2020-11-23 DIAGNOSIS — N179 Acute kidney failure, unspecified: Secondary | ICD-10-CM | POA: Diagnosis not present

## 2020-11-23 DIAGNOSIS — D631 Anemia in chronic kidney disease: Secondary | ICD-10-CM | POA: Diagnosis not present

## 2020-11-23 DIAGNOSIS — N1832 Chronic kidney disease, stage 3b: Secondary | ICD-10-CM | POA: Diagnosis not present

## 2020-11-23 DIAGNOSIS — E1140 Type 2 diabetes mellitus with diabetic neuropathy, unspecified: Secondary | ICD-10-CM | POA: Diagnosis not present

## 2020-11-23 DIAGNOSIS — Z48812 Encounter for surgical aftercare following surgery on the circulatory system: Secondary | ICD-10-CM | POA: Diagnosis not present

## 2020-11-23 DIAGNOSIS — E1122 Type 2 diabetes mellitus with diabetic chronic kidney disease: Secondary | ICD-10-CM | POA: Diagnosis not present

## 2020-11-23 DIAGNOSIS — I442 Atrioventricular block, complete: Secondary | ICD-10-CM | POA: Diagnosis not present

## 2020-11-23 DIAGNOSIS — I495 Sick sinus syndrome: Secondary | ICD-10-CM | POA: Diagnosis not present

## 2020-11-24 DIAGNOSIS — E1140 Type 2 diabetes mellitus with diabetic neuropathy, unspecified: Secondary | ICD-10-CM | POA: Diagnosis not present

## 2020-11-24 DIAGNOSIS — N179 Acute kidney failure, unspecified: Secondary | ICD-10-CM | POA: Diagnosis not present

## 2020-11-24 DIAGNOSIS — I442 Atrioventricular block, complete: Secondary | ICD-10-CM | POA: Diagnosis not present

## 2020-11-24 DIAGNOSIS — I1310 Hypertensive heart and chronic kidney disease without heart failure, with stage 1 through stage 4 chronic kidney disease, or unspecified chronic kidney disease: Secondary | ICD-10-CM | POA: Diagnosis not present

## 2020-11-24 DIAGNOSIS — E1122 Type 2 diabetes mellitus with diabetic chronic kidney disease: Secondary | ICD-10-CM | POA: Diagnosis not present

## 2020-11-24 DIAGNOSIS — I495 Sick sinus syndrome: Secondary | ICD-10-CM | POA: Diagnosis not present

## 2020-11-24 DIAGNOSIS — Z48812 Encounter for surgical aftercare following surgery on the circulatory system: Secondary | ICD-10-CM | POA: Diagnosis not present

## 2020-11-24 DIAGNOSIS — N1832 Chronic kidney disease, stage 3b: Secondary | ICD-10-CM | POA: Diagnosis not present

## 2020-11-24 DIAGNOSIS — D631 Anemia in chronic kidney disease: Secondary | ICD-10-CM | POA: Diagnosis not present

## 2020-11-28 DIAGNOSIS — E1169 Type 2 diabetes mellitus with other specified complication: Secondary | ICD-10-CM | POA: Diagnosis not present

## 2020-11-28 DIAGNOSIS — E1122 Type 2 diabetes mellitus with diabetic chronic kidney disease: Secondary | ICD-10-CM | POA: Diagnosis not present

## 2020-11-28 DIAGNOSIS — E785 Hyperlipidemia, unspecified: Secondary | ICD-10-CM | POA: Diagnosis not present

## 2020-11-28 DIAGNOSIS — I1310 Hypertensive heart and chronic kidney disease without heart failure, with stage 1 through stage 4 chronic kidney disease, or unspecified chronic kidney disease: Secondary | ICD-10-CM | POA: Diagnosis not present

## 2020-11-28 DIAGNOSIS — N1832 Chronic kidney disease, stage 3b: Secondary | ICD-10-CM | POA: Diagnosis not present

## 2020-11-28 DIAGNOSIS — E1142 Type 2 diabetes mellitus with diabetic polyneuropathy: Secondary | ICD-10-CM | POA: Diagnosis not present

## 2020-11-28 DIAGNOSIS — Z48812 Encounter for surgical aftercare following surgery on the circulatory system: Secondary | ICD-10-CM | POA: Diagnosis not present

## 2020-11-28 DIAGNOSIS — I442 Atrioventricular block, complete: Secondary | ICD-10-CM | POA: Diagnosis not present

## 2020-11-28 DIAGNOSIS — E1140 Type 2 diabetes mellitus with diabetic neuropathy, unspecified: Secondary | ICD-10-CM | POA: Diagnosis not present

## 2020-11-28 DIAGNOSIS — Z794 Long term (current) use of insulin: Secondary | ICD-10-CM | POA: Diagnosis not present

## 2020-11-28 DIAGNOSIS — I495 Sick sinus syndrome: Secondary | ICD-10-CM | POA: Diagnosis not present

## 2020-11-28 DIAGNOSIS — M810 Age-related osteoporosis without current pathological fracture: Secondary | ICD-10-CM | POA: Diagnosis not present

## 2020-11-29 ENCOUNTER — Other Ambulatory Visit: Payer: Self-pay

## 2020-11-30 DIAGNOSIS — N179 Acute kidney failure, unspecified: Secondary | ICD-10-CM | POA: Diagnosis not present

## 2020-11-30 DIAGNOSIS — E1122 Type 2 diabetes mellitus with diabetic chronic kidney disease: Secondary | ICD-10-CM | POA: Diagnosis not present

## 2020-11-30 DIAGNOSIS — E1140 Type 2 diabetes mellitus with diabetic neuropathy, unspecified: Secondary | ICD-10-CM | POA: Diagnosis not present

## 2020-11-30 DIAGNOSIS — N1832 Chronic kidney disease, stage 3b: Secondary | ICD-10-CM | POA: Diagnosis not present

## 2020-11-30 DIAGNOSIS — I1310 Hypertensive heart and chronic kidney disease without heart failure, with stage 1 through stage 4 chronic kidney disease, or unspecified chronic kidney disease: Secondary | ICD-10-CM | POA: Diagnosis not present

## 2020-11-30 DIAGNOSIS — I495 Sick sinus syndrome: Secondary | ICD-10-CM | POA: Diagnosis not present

## 2020-11-30 DIAGNOSIS — I442 Atrioventricular block, complete: Secondary | ICD-10-CM | POA: Diagnosis not present

## 2020-11-30 DIAGNOSIS — Z48812 Encounter for surgical aftercare following surgery on the circulatory system: Secondary | ICD-10-CM | POA: Diagnosis not present

## 2020-11-30 DIAGNOSIS — D631 Anemia in chronic kidney disease: Secondary | ICD-10-CM | POA: Diagnosis not present

## 2020-12-01 ENCOUNTER — Ambulatory Visit (INDEPENDENT_AMBULATORY_CARE_PROVIDER_SITE_OTHER): Payer: Medicare HMO | Admitting: Gastroenterology

## 2020-12-01 ENCOUNTER — Other Ambulatory Visit: Payer: Self-pay

## 2020-12-01 ENCOUNTER — Encounter: Payer: Self-pay | Admitting: Gastroenterology

## 2020-12-01 VITALS — BP 158/69 | HR 96 | Temp 98.4°F | Ht 64.0 in | Wt 174.1 lb

## 2020-12-01 DIAGNOSIS — D7589 Other specified diseases of blood and blood-forming organs: Secondary | ICD-10-CM

## 2020-12-01 DIAGNOSIS — I442 Atrioventricular block, complete: Secondary | ICD-10-CM | POA: Diagnosis not present

## 2020-12-01 DIAGNOSIS — N1832 Chronic kidney disease, stage 3b: Secondary | ICD-10-CM | POA: Diagnosis not present

## 2020-12-01 DIAGNOSIS — E1122 Type 2 diabetes mellitus with diabetic chronic kidney disease: Secondary | ICD-10-CM | POA: Diagnosis not present

## 2020-12-01 DIAGNOSIS — A498 Other bacterial infections of unspecified site: Secondary | ICD-10-CM

## 2020-12-01 DIAGNOSIS — E1140 Type 2 diabetes mellitus with diabetic neuropathy, unspecified: Secondary | ICD-10-CM | POA: Diagnosis not present

## 2020-12-01 DIAGNOSIS — D500 Iron deficiency anemia secondary to blood loss (chronic): Secondary | ICD-10-CM | POA: Diagnosis not present

## 2020-12-01 DIAGNOSIS — I1310 Hypertensive heart and chronic kidney disease without heart failure, with stage 1 through stage 4 chronic kidney disease, or unspecified chronic kidney disease: Secondary | ICD-10-CM | POA: Diagnosis not present

## 2020-12-01 DIAGNOSIS — Z48812 Encounter for surgical aftercare following surgery on the circulatory system: Secondary | ICD-10-CM | POA: Diagnosis not present

## 2020-12-01 DIAGNOSIS — N179 Acute kidney failure, unspecified: Secondary | ICD-10-CM | POA: Diagnosis not present

## 2020-12-01 DIAGNOSIS — I495 Sick sinus syndrome: Secondary | ICD-10-CM | POA: Diagnosis not present

## 2020-12-01 DIAGNOSIS — D631 Anemia in chronic kidney disease: Secondary | ICD-10-CM | POA: Diagnosis not present

## 2020-12-01 NOTE — Progress Notes (Signed)
[name], MD [address]  [place]  [place], [place] [address]  Main: [phone]  Fax: [phone]    Gastroenterology Consultation  Referring Provider:     Sofie Hartigan, MD Primary Care Physician:  Sofie Hartigan, MD Primary Gastroenterologist:  Dr. Gustavo Lah Reason for Consultation:     Chronic iron deficiency anemia, AVMs        HPI:   TURNER KUNZMAN is a 80 y.o. female referred by Dr. Ellison Hughs [name], MD  for consultation & management of chronic iron deficiency anemia.  Patient is functionally independent, has metabolic syndrome, with chronic iron deficiency anemia, on oral iron therapy, diabetes, hypertension, hyperlipidemia, cirrhosis of liver, well compensated who was admitted to [facility] on 02/21/2018 secondary to severe symptomatic anemia, hemoglobin 6.3 and melena.  She was also taking oral iron.  During this admission, patient underwent push enteroscopy as well as colonoscopy which revealed small bowel AVMs as well as several AVMs in the ascending colon and cecum which were all treated with APC.  Patient was discharged home on oral iron therapy  She was also admitted in last week of July secondary to severe symptomatic anemia, underwent EGD which was unremarkable except for mild portal hypertensive gastropathy. Patient previously underwent EGD and colonoscopy in 08/2016, revealed nonbleeding AVMs in the ascending colon, left untreated. She never had a video capsule endoscopy performed.  Interval summary: Patient reports doing well since discharge.  She continues to be on oral iron.  She reports that her stools interchange between yellow, brown and black.  She has a follow-up appointment with Dr. Grayland Ormond on Friday this week, also scheduled for pre-clinic labs.  She is accompanied by her son today.  She denies any other GI symptoms.  Follow-up visit 07/14/2017 She had an attack of gout which is now in remission.  Currently on colchicine.  She continues to  take oral iron.  She reports having dark formed stools on oral iron.  Last hemoglobin 11 on 05/04/2018.  Normal iron stores as of 03/2018 Patient reports that her fatigue wax and wanes but overall doing well  Follow-up visit 01/28/2019 Patient continues to gain weight significantly, diabetes has worsened, last hemoglobin A1c was 7.9 in 09/2018, went up from 05/2018.  Patient was consuming Ensure regularly, stopped when she found that her blood sugars were rising.  She loves eating ice cream, drinks coffee with creamer.  She is taking oral iron 2 pills, 3 times a week.  She reports her stools her symptoms black.  She reports having good energy levels.  Did not have any GI symptoms today.  She is accompanied by her son today  Follow-up visit 12/01/2020 Patient is here for follow-up of C. difficile infection.  She was recently hospitalized secondary to diarrhea, dehydration that has resulted in AKI.  She also had a pacemaker placement during that hospitalization because she presented with syncope secondary to third-degree AV block.  Patient was discharged home on 10 days course of oral vancomycin which she has completed.  She reports that her diarrhea is significantly improved from several episodes of watery bowel movements to 3-4 episodes of soft pudding like brown bowel movements.  She denies any abdominal pain, reports good appetite, denies fever, chills, nausea or vomiting.  Patient deferred video capsule endoscopy in the past for work-up of iron deficiency anemia  NSAIDs: None  Antiplts/Anticoagulants/Anti thrombotics: Aspirin 81  GI Procedures:   Small bowel endoscopy 02/22/2018 - Three non-bleeding angioectasias in the jejunum.  Treated with argon plasma coagulation (APC). - Normal esophagus. - Normal stomach. - No specimens collected.  Colonoscopy 02/22/2018 - Preparation of the colon was fair. - Multiple bleeding colonic angioectasias. Treated with argon plasma coagulation (APC). - The  examination was otherwise normal on direct and retroflexion views. - No specimens collected.  EGD 01/30/2018 by Dr. Alice Reichert - Widely patent and non-obstructing Schatzki ring. - Portal hypertensive gastropathy. - Normal examined duodenum. - The examination was otherwise normal. - No specimens collected.  EGD and colonoscopy 08/2016 by Dr. Gustavo Lah for iron deficiency anemia AVMs, nonbleeding in ascending colon were detected, not treated DIAGNOSIS:  A. STOMACH, ANTRUM AND BODY; COLD BIOPSY:  - ANTRAL AND OXYNTIC MUCOSA WITH MODERATE CHRONIC ACTIVE GASTRITIS,  HELICOBACTER PYLORI ASSOCIATED.  - NEGATIVE FOR DYSPLASIA AND MALIGNANCY.   B. GE JUNCTION; COLD BIOPSY:  - REFLUX GASTROESOPHAGITIS.  - NEGATIVE FOR GOBLET CELLS, DYSPLASIA AND MALIGNANCY.   C. COLON, RANDOM RIGHT; COLD BIOPSY:  - NO PATHOLOGIC CHANGE.   D. COLON, RANDOM LEFT; COLD BIOPSY:  - NO PATHOLOGIC CHANGE.   E. RECTUM POLYP; COLD BIOPSY:  - POLYPOID SQUAMOCOLUMNAR (ANORECTAL) MUCOSA.  - PROMINENT LYMPHOID AGGREGATE.  - NEGATIVE FOR DYSPLASIA AND MALIGNANCY.  - MULTIPLE DEEPER LEVELS WERE EXAMINED.   Past Medical History:  Diagnosis Date  . Cancer [facility])    breast cancer right mastectomy  . Chronic kidney disease   . Diabetes mellitus without complication ([place])   . GIB (gastrointestinal bleeding) 02/20/2018  . Glaucoma   . Hyperlipidemia   . Hypertension   . Neuropathy   . Osteoarthritis   . Rheumatoid arthritis ([place])   . Telangiectasias     Past Surgical History:  Procedure Laterality Date  . BREAST SURGERY     right mastectomy  . CHOLECYSTECTOMY    . COLONOSCOPY    . COLONOSCOPY WITH PROPOFOL N/A 08/14/2016   Procedure: COLONOSCOPY WITH PROPOFOL;  Surgeon: Lollie Sails, MD;  Location[facility] ENDOSCOPY;  Service: Endoscopy;  Laterality: N/A;  . COLONOSCOPY WITH PROPOFOL N/A 02/22/2018   Procedure: COLONOSCOPY WITH PROPOFOL;  Surgeon: Jonathon Bellows, MD;  Location: [facility] ENDOSCOPY;  Service:  Gastroenterology;  Laterality: N/A;  . ESOPHAGOGASTRODUODENOSCOPY (EGD) WITH PROPOFOL N/A 08/14/2016   Procedure: ESOPHAGOGASTRODUODENOSCOPY (EGD) WITH PROPOFOL;  Surgeon: Lollie Sails, MD;  Location: [facility] ENDOSCOPY;  Service: Endoscopy;  Laterality: N/A;  . ESOPHAGOGASTRODUODENOSCOPY (EGD) WITH PROPOFOL N/A 01/30/2018   Procedure: ESOPHAGOGASTRODUODENOSCOPY (EGD) WITH PROPOFOL;  Surgeon: Toledo, Benay Pike, MD;  Location: [facility] ENDOSCOPY;  Service: Gastroenterology;  Laterality: N/A;  . ESOPHAGOGASTRODUODENOSCOPY (EGD) WITH PROPOFOL N/A 02/22/2018   Procedure: ESOPHAGOGASTRODUODENOSCOPY (EGD) WITH PROPOFOL;  Surgeon: Jonathon Bellows, MD;  Location: [facility] ENDOSCOPY;  Service: Gastroenterology;  Laterality: N/A;  . EXCISIONAL HEMORRHOIDECTOMY    . KYPHOPLASTY N/A 03/20/2018   Procedure: [id number];  Surgeon: Hessie Knows, MD;  Location: [facility] ORS;  Service: Orthopedics;  Laterality: N/A;  . PACEMAKER IMPLANT N/A 10/26/2020   Procedure: PACEMAKER IMPLANT;  Surgeon: Isaias Cowman, MD;  Location: [place] CV LAB;  Service: Cardiovascular;  Laterality: N/A;    Current Outpatient Medications:  .  ACCU-CHEK AVIVA PLUS test strip, , Disp: , Rfl:  .  ACCU-CHEK SOFTCLIX LANCETS lancets, once daily Use as instructed., Disp: , Rfl:  .  ACCU-CHEK SOFTCLIX LANCETS lancets, ONCE DAILY USE AS INSTRUCTED., Disp: , Rfl: 5 .  BD AUTOSHIELD DUO 30G X 5 MM MISC, , Disp: , Rfl:  .  BD PEN NEEDLE NANO U/F 32G X 4 MM MISC, , Disp: ,  Rfl:  .  Biotin 1000 MCG tablet, Take by mouth., Disp: , Rfl:  .  Cholecalciferol 50 MCG (2000 UT) CAPS, Take by mouth., Disp: , Rfl:  .  doxepin (SINEQUAN) 25 MG capsule, Take 25 mg by mouth daily., Disp: , Rfl:  .  feeding supplement, ENSURE ENLIVE, (ENSURE ENLIVE) LIQD, Take 237 mLs by mouth 2 (two) times daily between meals., Disp: 237 mL, Rfl: 12 .  ferrous fumarate (HEMOCYTE - 106 MG FE) 325 (106 Fe) MG TABS tablet, Take 1 tablet (106 mg of iron total) by mouth 2 (two)  times daily., Disp: 60 each, Rfl: 0 .  glipiZIDE (GLUCOTROL) 5 MG tablet, Take by mouth., Disp: , Rfl:  .  insulin glargine (LANTUS) 100 UNIT/ML Solostar Pen, Inject 34 Units into the skin daily. (may take up to 50u based upon blood glucose reading), Disp: , Rfl:  .  Insulin Pen Needle 32G X 4 MM MISC, USE 1 NEEDLE SUBCUTANEOUSLY ONCE A DAY AS DIRECTED, Disp: , Rfl:  .  ketoconazole (NIZORAL) 2 % cream, , Disp: , Rfl:  .  ketoconazole (NIZORAL) 2 % shampoo, Apply 1 application topically daily. (apply to scalp and wait at least 30 minutes before rinsing), Disp: , Rfl:  .  losartan (COZAAR) 50 MG tablet, TAKE 1/2 TABLET (25MG) BY MOUTH EVERY DAY, Disp: , Rfl:  .  Magnesium Oxide 400 (240 Mg) MG TABS, TAKE 2 TABLETS (800 MG TOTAL) BY MOUTH DAILY., Disp: , Rfl: 11 .  metFORMIN (GLUCOPHAGE-XR) 500 MG 24 hr tablet, , Disp: , Rfl:  .  niacin (NIASPAN) 500 MG CR tablet, Take 500 mg by mouth every evening., Disp: , Rfl: 2 .  simvastatin (ZOCOR) 20 MG tablet, Take 20 mg by mouth at bedtime., Disp: , Rfl: 6 .  sitaGLIPtin (JANUVIA) 50 MG tablet, Take 50 mg by mouth daily., Disp: , Rfl:  .  TRAVATAN Z 0.004 % SOLN ophthalmic solution, Place 1 drop into the left eye at bedtime., Disp: , Rfl:  .  vitamin B-12 (CYANOCOBALAMIN) 1000 MCG tablet, Take 1,000 mcg by mouth daily., Disp: , Rfl:  .  aspirin 81 MG EC tablet, Take 81 mg by mouth daily.  (Patient not taking: Reported on 12/01/2020), Disp: , Rfl:  .  Omega-3 Fatty Acids (FISH OIL) 1000 MG CAPS, Take 1 capsule by mouth.  (Patient not taking: Reported on 12/01/2020), Disp: , Rfl:    Family History  Problem Relation Age of Onset  . Kidney cancer Son   . Bladder Cancer Neg Hx      Social History   Tobacco Use  . Smoking status: Former Smoker    Quit date: 07/10/2011    Years since quitting: 9.4  . Smokeless tobacco: Never Used  Vaping Use  . Vaping Use: Never used  Substance Use Topics  . Alcohol use: No  . Drug use: No    Allergies as of  12/01/2020  . (No Known Allergies)    Review of Systems:    All systems reviewed and negative except where noted in HPI.   Physical Exam:  BP (!) 158/69 (BP Location: Left Arm, Patient Position: Supine, Cuff Size: Normal)   Pulse 96   Temp 98.4 F (36.9 C) (Oral)   Ht 5' 4"  (1.626 m)   Wt 174 lb 2 oz (79 kg)   BMI 29.89 kg/m  No LMP recorded. Patient is postmenopausal.  General:   Alert,  Well-developed, well-nourished, pleasant and cooperative in NAD Head:  Normocephalic and  atraumatic. Eyes:  Sclera clear, no icterus.   Conjunctiva pink. Ears:  Normal auditory acuity. Nose:  No deformity, discharge, or lesions. Mouth:  No deformity or lesions,oropharynx pink & moist. Neck:  Supple; no masses or thyromegaly. Lungs:  Respirations even and unlabored.  Clear throughout to auscultation.   No wheezes, crackles, or rhonchi. No acute distress. Heart:  Regular rate and rhythm; no murmurs, clicks, rubs, or gallops. Abdomen:  Normal bowel sounds. Soft, obese, non-tender and non-distended without masses, hepatosplenomegaly or hernias noted.  No guarding or rebound tenderness.   Rectal: Not performed Msk:  Symmetrical without gross deformities. Good, equal movement & strength bilaterally. Pulses:  Normal pulses noted. Extremities:  No clubbing or edema.  No cyanosis. Neurologic:  Alert and oriented x3;  grossly normal neurologically. Skin:  Intact without significant lesions or rashes. No jaundice. Psych:  Alert and cooperative. Normal mood and affect.  Imaging Studies: Reviewed  Assessment and Plan:   ODEAN MCELWAIN is a 80 y.o. Caucasian female with metabolic syndrome, well compensated cirrhosis, probably NASH in etiology, chronic iron deficiency anemia secondary to chronic blood loss from small bowel and colonic AVMs.  She underwent small bowel endoscopy and colonoscopy, AVMs in jejunum, AC and cecum that were detected were cauterized Recent hospitalization secondary to C. difficile  infection s/p 10 days course of oral vancomycin  C. difficile diarrhea, significantly improved Currently having soft pudding-like bowel movements Trial of Imodium as well as lactose-free diet If diarrhea worsens, patient will call my office, will recheck for C. difficile  Iron deficiency anemia secondary to chronic blood loss from small bowel and colonic AVMs -Her hemoglobin is overall stable -Most recent ferritin levels were normal as of 03/2018, recheck today -No evidence of B12 or folate deficiency, recheck today -I have discussed with her about video capsule endoscopy and possible balloon enteroscopy if there are more small bowel AVMs, however, patient would like to wait for next follow-up visit before undergoing this test   Compensated cirrhosis, probably NASH in etiology Portal hypertension: Manifested as mild portal hypertensive gastropathy and splenomegaly No evidence of varices based on EGD from 01/2018 Patient is euvolemic, no evidence of ascites HCC screening: Up-to-date on imaging, AFP negative, will repeat right upper quadrant ultrasound during next visit PSE none  Follow up in 6 months   [name], MD Is just 80 year old

## 2020-12-02 ENCOUNTER — Ambulatory Visit: Payer: Medicare HMO | Admitting: Urology

## 2020-12-02 LAB — IRON,TIBC AND FERRITIN PANEL
Ferritin: 38 ng/mL (ref 15–150)
Iron Saturation: 16 % (ref 15–55)
Iron: 62 ug/dL (ref 27–139)
Total Iron Binding Capacity: 400 ug/dL (ref 250–450)
UIBC: 338 ug/dL (ref 118–369)

## 2020-12-02 LAB — B12 AND FOLATE PANEL
Folate: 11.7 ng/mL (ref >3.0)
Vitamin B-12: 1584 pg/mL — ABNORMAL HIGH (ref 232–1245)

## 2020-12-03 DIAGNOSIS — I1310 Hypertensive heart and chronic kidney disease without heart failure, with stage 1 through stage 4 chronic kidney disease, or unspecified chronic kidney disease: Secondary | ICD-10-CM | POA: Diagnosis not present

## 2020-12-03 DIAGNOSIS — N179 Acute kidney failure, unspecified: Secondary | ICD-10-CM | POA: Diagnosis not present

## 2020-12-03 DIAGNOSIS — E1122 Type 2 diabetes mellitus with diabetic chronic kidney disease: Secondary | ICD-10-CM | POA: Diagnosis not present

## 2020-12-03 DIAGNOSIS — D631 Anemia in chronic kidney disease: Secondary | ICD-10-CM | POA: Diagnosis not present

## 2020-12-03 DIAGNOSIS — I495 Sick sinus syndrome: Secondary | ICD-10-CM | POA: Diagnosis not present

## 2020-12-03 DIAGNOSIS — N1832 Chronic kidney disease, stage 3b: Secondary | ICD-10-CM | POA: Diagnosis not present

## 2020-12-03 DIAGNOSIS — Z48812 Encounter for surgical aftercare following surgery on the circulatory system: Secondary | ICD-10-CM | POA: Diagnosis not present

## 2020-12-03 DIAGNOSIS — I442 Atrioventricular block, complete: Secondary | ICD-10-CM | POA: Diagnosis not present

## 2020-12-03 DIAGNOSIS — E1140 Type 2 diabetes mellitus with diabetic neuropathy, unspecified: Secondary | ICD-10-CM | POA: Diagnosis not present

## 2020-12-07 DIAGNOSIS — I442 Atrioventricular block, complete: Secondary | ICD-10-CM | POA: Diagnosis not present

## 2020-12-07 DIAGNOSIS — D631 Anemia in chronic kidney disease: Secondary | ICD-10-CM | POA: Diagnosis not present

## 2020-12-07 DIAGNOSIS — Z48812 Encounter for surgical aftercare following surgery on the circulatory system: Secondary | ICD-10-CM | POA: Diagnosis not present

## 2020-12-07 DIAGNOSIS — E1140 Type 2 diabetes mellitus with diabetic neuropathy, unspecified: Secondary | ICD-10-CM | POA: Diagnosis not present

## 2020-12-07 DIAGNOSIS — I1310 Hypertensive heart and chronic kidney disease without heart failure, with stage 1 through stage 4 chronic kidney disease, or unspecified chronic kidney disease: Secondary | ICD-10-CM | POA: Diagnosis not present

## 2020-12-07 DIAGNOSIS — I495 Sick sinus syndrome: Secondary | ICD-10-CM | POA: Diagnosis not present

## 2020-12-07 DIAGNOSIS — E1122 Type 2 diabetes mellitus with diabetic chronic kidney disease: Secondary | ICD-10-CM | POA: Diagnosis not present

## 2020-12-07 DIAGNOSIS — N1832 Chronic kidney disease, stage 3b: Secondary | ICD-10-CM | POA: Diagnosis not present

## 2020-12-07 DIAGNOSIS — N179 Acute kidney failure, unspecified: Secondary | ICD-10-CM | POA: Diagnosis not present

## 2020-12-08 DIAGNOSIS — I1310 Hypertensive heart and chronic kidney disease without heart failure, with stage 1 through stage 4 chronic kidney disease, or unspecified chronic kidney disease: Secondary | ICD-10-CM | POA: Diagnosis not present

## 2020-12-08 DIAGNOSIS — E1140 Type 2 diabetes mellitus with diabetic neuropathy, unspecified: Secondary | ICD-10-CM | POA: Diagnosis not present

## 2020-12-08 DIAGNOSIS — N179 Acute kidney failure, unspecified: Secondary | ICD-10-CM | POA: Diagnosis not present

## 2020-12-08 DIAGNOSIS — D631 Anemia in chronic kidney disease: Secondary | ICD-10-CM | POA: Diagnosis not present

## 2020-12-08 DIAGNOSIS — N1832 Chronic kidney disease, stage 3b: Secondary | ICD-10-CM | POA: Diagnosis not present

## 2020-12-08 DIAGNOSIS — Z48812 Encounter for surgical aftercare following surgery on the circulatory system: Secondary | ICD-10-CM | POA: Diagnosis not present

## 2020-12-08 DIAGNOSIS — E1122 Type 2 diabetes mellitus with diabetic chronic kidney disease: Secondary | ICD-10-CM | POA: Diagnosis not present

## 2020-12-08 DIAGNOSIS — I442 Atrioventricular block, complete: Secondary | ICD-10-CM | POA: Diagnosis not present

## 2020-12-08 DIAGNOSIS — I495 Sick sinus syndrome: Secondary | ICD-10-CM | POA: Diagnosis not present

## 2020-12-13 DIAGNOSIS — I442 Atrioventricular block, complete: Secondary | ICD-10-CM | POA: Diagnosis not present

## 2020-12-13 DIAGNOSIS — E1140 Type 2 diabetes mellitus with diabetic neuropathy, unspecified: Secondary | ICD-10-CM | POA: Diagnosis not present

## 2020-12-13 DIAGNOSIS — N1832 Chronic kidney disease, stage 3b: Secondary | ICD-10-CM | POA: Diagnosis not present

## 2020-12-13 DIAGNOSIS — Z48812 Encounter for surgical aftercare following surgery on the circulatory system: Secondary | ICD-10-CM | POA: Diagnosis not present

## 2020-12-13 DIAGNOSIS — I1310 Hypertensive heart and chronic kidney disease without heart failure, with stage 1 through stage 4 chronic kidney disease, or unspecified chronic kidney disease: Secondary | ICD-10-CM | POA: Diagnosis not present

## 2020-12-13 DIAGNOSIS — I495 Sick sinus syndrome: Secondary | ICD-10-CM | POA: Diagnosis not present

## 2020-12-13 DIAGNOSIS — N179 Acute kidney failure, unspecified: Secondary | ICD-10-CM | POA: Diagnosis not present

## 2020-12-13 DIAGNOSIS — D631 Anemia in chronic kidney disease: Secondary | ICD-10-CM | POA: Diagnosis not present

## 2020-12-13 DIAGNOSIS — E1122 Type 2 diabetes mellitus with diabetic chronic kidney disease: Secondary | ICD-10-CM | POA: Diagnosis not present

## 2020-12-14 DIAGNOSIS — I442 Atrioventricular block, complete: Secondary | ICD-10-CM | POA: Diagnosis not present

## 2020-12-14 DIAGNOSIS — I495 Sick sinus syndrome: Secondary | ICD-10-CM | POA: Diagnosis not present

## 2020-12-14 DIAGNOSIS — E1122 Type 2 diabetes mellitus with diabetic chronic kidney disease: Secondary | ICD-10-CM | POA: Diagnosis not present

## 2020-12-14 DIAGNOSIS — I1310 Hypertensive heart and chronic kidney disease without heart failure, with stage 1 through stage 4 chronic kidney disease, or unspecified chronic kidney disease: Secondary | ICD-10-CM | POA: Diagnosis not present

## 2020-12-14 DIAGNOSIS — N1832 Chronic kidney disease, stage 3b: Secondary | ICD-10-CM | POA: Diagnosis not present

## 2020-12-14 DIAGNOSIS — E1140 Type 2 diabetes mellitus with diabetic neuropathy, unspecified: Secondary | ICD-10-CM | POA: Diagnosis not present

## 2020-12-14 DIAGNOSIS — Z48812 Encounter for surgical aftercare following surgery on the circulatory system: Secondary | ICD-10-CM | POA: Diagnosis not present

## 2020-12-14 DIAGNOSIS — N179 Acute kidney failure, unspecified: Secondary | ICD-10-CM | POA: Diagnosis not present

## 2020-12-14 DIAGNOSIS — D631 Anemia in chronic kidney disease: Secondary | ICD-10-CM | POA: Diagnosis not present

## 2020-12-15 DIAGNOSIS — I25118 Atherosclerotic heart disease of native coronary artery with other forms of angina pectoris: Secondary | ICD-10-CM | POA: Diagnosis not present

## 2020-12-15 DIAGNOSIS — R778 Other specified abnormalities of plasma proteins: Secondary | ICD-10-CM | POA: Diagnosis not present

## 2020-12-16 ENCOUNTER — Other Ambulatory Visit: Payer: Self-pay

## 2020-12-16 ENCOUNTER — Encounter: Payer: Self-pay | Admitting: Urology

## 2020-12-16 ENCOUNTER — Ambulatory Visit (INDEPENDENT_AMBULATORY_CARE_PROVIDER_SITE_OTHER): Payer: Medicare HMO | Admitting: Urology

## 2020-12-16 VITALS — BP 154/69 | HR 97 | Ht 64.0 in | Wt 170.0 lb

## 2020-12-16 DIAGNOSIS — N2889 Other specified disorders of kidney and ureter: Secondary | ICD-10-CM

## 2020-12-16 NOTE — Progress Notes (Signed)
12/16/2020 1:37 PM   [name] Apr 28, 1941 [id number]  Referring provider: Sofie Hartigan, [place],  [place] [address]  Chief Complaint  Patient presents with   Follow-up    Discuss [place] results    HPI: 80 year old female with multiple medical comorbidities who returns today for surveillance of an incidental left renal mass.  Since last visit, she is had a fall, got a pacemaker, bit admitted, had C. difficile, and a host of other medical issues.  She did have getting a CT scan during her admission in April as well in addition to the follow-up renal ultrasound for which she returns to discuss today.   She was initially seen in 10/2016 at which time a 1.7 x 1.6 cm enhancing interpolar left renal mass was identified.  She underwent follow-up CT abdomen with and without contrast on 01/30/2017 which showed the lesion had enlarged slightly to 1.9 x 1.8 cm which is most consistent with a Bosniak 3 lesion, primarily cystic with thick enhancing  wall.   Renal ultrasound on 02/05/2018 shows mild interval increase in the lesion, now measuring 2 x 1.5 x 1.8 cm which has a more solid appearance than previous.  This is in comparison to a renal ultrasound on from 07/2017 at which time the lesion measured 1.3 x 1.2 x 1.4 cm described as s complex exophytic midpole partially cystic structure on the kidney.   She had an interval CT scan during an admission 9/19 which showed unchanged mass.   Interval renal ultrasound on 01/08/2019 shows slight interval growth of the lesion measuring 2.3 x 1.9 x 1.8 (previously 2 x 1.5 x 1.8 cm).  Renal ultrasound on 10/17/2020 shows mass measuring 2.9 x 2.8 x 2.5 cm with gradual enlargement.  On her CT scan during the admission, the mass appeared to be 2.2 cm.   This was not appreciated on previous scan on 11/2012.   She denies any weight loss or flank pain.   PMH: Past Medical History:  Diagnosis Date   Cancer [facility])    breast cancer right  mastectomy   Chronic kidney disease    Diabetes mellitus without complication ([place])    GIB (gastrointestinal bleeding) 02/20/2018   Glaucoma    Hyperlipidemia    Hypertension    Neuropathy    Osteoarthritis    Rheumatoid arthritis ([place])    Telangiectasias     Surgical History: Past Surgical History:  Procedure Laterality Date   BREAST SURGERY     right mastectomy   CHOLECYSTECTOMY     COLONOSCOPY     COLONOSCOPY WITH PROPOFOL N/A 08/14/2016   Procedure: COLONOSCOPY WITH PROPOFOL;  Surgeon: Lollie Sails, MD;  Location: [facility] ENDOSCOPY;  Service: Endoscopy;  Laterality: N/A;   COLONOSCOPY WITH PROPOFOL N/A 02/22/2018   Procedure: COLONOSCOPY WITH PROPOFOL;  Surgeon: Jonathon Bellows, MD;  Location: [facility] ENDOSCOPY;  Service: Gastroenterology;  Laterality: N/A;   ESOPHAGOGASTRODUODENOSCOPY (EGD) WITH PROPOFOL N/A 08/14/2016   Procedure: ESOPHAGOGASTRODUODENOSCOPY (EGD) WITH PROPOFOL;  Surgeon: Lollie Sails, MD;  Location: [facility] ENDOSCOPY;  Service: Endoscopy;  Laterality: N/A;   ESOPHAGOGASTRODUODENOSCOPY (EGD) WITH PROPOFOL N/A 01/30/2018   Procedure: ESOPHAGOGASTRODUODENOSCOPY (EGD) WITH PROPOFOL;  Surgeon: Toledo, Benay Pike, MD;  Location: [facility] ENDOSCOPY;  Service: Gastroenterology;  Laterality: N/A;   ESOPHAGOGASTRODUODENOSCOPY (EGD) WITH PROPOFOL N/A 02/22/2018   Procedure: ESOPHAGOGASTRODUODENOSCOPY (EGD) WITH PROPOFOL;  Surgeon: Jonathon Bellows, MD;  Location: [facility] ENDOSCOPY;  Service: Gastroenterology;  Laterality: N/A;   EXCISIONAL HEMORRHOIDECTOMY  KYPHOPLASTY N/A 03/20/2018   Procedure: [id number];  Surgeon: Hessie Knows, MD;  Location: [facility] ORS;  Service: Orthopedics;  Laterality: N/A;   PACEMAKER IMPLANT N/A 10/26/2020   Procedure: PACEMAKER IMPLANT;  Surgeon: Isaias Cowman, MD;  Location: [place] CV LAB;  Service: Cardiovascular;  Laterality: N/A;    Home Medications:  Allergies as of 12/16/2020   No Known Allergies      Medication List         Accurate as of December 16, 2020  1:37 PM. If you have any questions, ask your nurse or doctor.          STOP taking these medications    aspirin 81 MG EC tablet Stopped by: Hollice Espy, MD       TAKE these medications    Accu-Chek Aviva Plus test strip Generic drug: glucose blood   Accu-Chek Softclix Lancets lancets once daily Use as instructed.   Biotin 1000 MCG tablet Take by mouth.   Cholecalciferol 50 MCG (2000 UT) Caps Take by mouth.   doxepin 25 MG capsule Commonly known as: SINEQUAN Take 25 mg by mouth daily.   feeding supplement Liqd Take 237 mLs by mouth 2 (two) times daily between meals.   Fish Oil 1000 MG Caps Take 1 capsule by mouth.   glipiZIDE 5 MG tablet Commonly known as: GLUCOTROL Take by mouth.   insulin glargine 100 UNIT/ML Solostar Pen Commonly known as: LANTUS Inject 34 Units into the skin daily. (may take up to 50u based upon blood glucose reading)   Insulin Pen Needle 32G X 4 MM Misc USE 1 NEEDLE SUBCUTANEOUSLY ONCE A DAY AS DIRECTED   ketoconazole 2 % shampoo Commonly known as: NIZORAL Apply 1 application topically daily. (apply to scalp and wait at least 30 minutes before rinsing)   ketoconazole 2 % cream Commonly known as: NIZORAL   losartan 50 MG tablet Commonly known as: COZAAR TAKE 1/2 TABLET (25MG) BY MOUTH EVERY DAY   magnesium oxide 400 (240 Mg) MG tablet Commonly known as: MAG-OX TAKE 2 TABLETS (800 MG TOTAL) BY MOUTH DAILY.   metFORMIN 500 MG 24 hr tablet Commonly known as: GLUCOPHAGE-XR   niacin 500 MG CR tablet Commonly known as: NIASPAN Take 500 mg by mouth every evening.   simvastatin 20 MG tablet Commonly known as: ZOCOR Take 20 mg by mouth at bedtime.   sitaGLIPtin 50 MG tablet Commonly known as: JANUVIA Take 50 mg by mouth daily.   Travatan Z 0.004 % Soln ophthalmic solution Generic drug: Travoprost (BAK Free) Place 1 drop into the left eye at bedtime.   vitamin B-12 1000 MCG tablet Commonly  known as: CYANOCOBALAMIN Take 1,000 mcg by mouth daily.        Allergies: No Known Allergies  Family History: Family History  Problem Relation Age of Onset   Kidney cancer Son    Bladder Cancer Neg Hx     Social History:  reports that she quit smoking about 9 years ago. Her smoking use included cigarettes. She has never used smokeless tobacco. She reports that she does not drink alcohol and does not use drugs.   Physical Exam: BP (!) 154/69   Pulse 97   Ht 5' 4"  (1.626 m)   Wt 170 lb (77.1 kg)   BMI 29.18 kg/m   Constitutional:  Alert and oriented, No acute distress.  Accompanied by son today.  Wearing right ankle boot. HEENT: [place] AT, moist mucus membranes.  Trachea midline, no masses. Cardiovascular: No clubbing,  cyanosis, or edema. Respiratory: Normal respiratory effort, no increased work of breathing. Skin: No rashes, bruises or suspicious lesions. Neurologic: Grossly intact, no focal deficits, moving all 4 extremities. Psychiatric: Normal mood and affect.  Laboratory Data: Lab Results  Component Value Date   WBC 6.5 10/29/2020   HGB 8.2 (L) 10/29/2020   HCT 26.3 (L) 10/29/2020   MCV 100.8 (H) 10/29/2020   PLT 143 (L) 10/29/2020    Lab Results  Component Value Date   CREATININE 1.81 (H) 10/29/2020     Lab Results  Component Value Date   HGBA1C 5.3 10/25/2020    Urinalysis    Component Value Date/Time   COLORURINE YELLOW (A) 10/25/2020 0601   APPEARANCEUR HAZY (A) 10/25/2020 0601   LABSPEC 1.015 10/25/2020 0601   PHURINE 5.0 10/25/2020 0601   GLUCOSEU NEGATIVE 10/25/2020 0601   HGBUR NEGATIVE 10/25/2020 0601   BILIRUBINUR NEGATIVE 10/25/2020 0601   KETONESUR 5 (A) 10/25/2020 0601   PROTEINUR 100 (A) 10/25/2020 0601   NITRITE NEGATIVE 10/25/2020 0601   LEUKOCYTESUR NEGATIVE 10/25/2020 0601    Lab Results  Component Value Date   BACTERIA NONE SEEN 10/25/2020    Pertinent Imaging: Narrative & Impression  CLINICAL DATA:  Follow-up left renal  lesions seen on recent ultrasound.   EXAM: CT ABDOMEN AND PELVIS WITHOUT CONTRAST   TECHNIQUE: Multidetector CT imaging of the abdomen and pelvis was performed following the standard protocol without IV contrast.   COMPARISON:  03/17/2018   FINDINGS: Examination is limited by respiratory motion and artifact from the arms being down by the side.   Lower chest: Stable emphysematous changes and pulmonary scarring. Stable cardiac enlargement and advanced aortic and coronary artery calcifications. Pacer wires are noted.   Hepatobiliary: Stable advanced cirrhotic changes involving the liver without obvious hepatic lesions without contrast. No intra or extrahepatic biliary dilatation.   Pancreas: No mass, inflammation or ductal dilatation.   Spleen: Mild stable splenomegaly.   Adrenals/Urinary Tract: The adrenal glands are unremarkable and stable.   There is a solid-appearing 2.2 cm lesion associated with the lateral cortex of the left kidney in the interpolar region. This measures 32 Hounsfield units. On the prior study from 2019 it measured approximately 16.5 mm.   No worrisome right-sided renal mass. Stable prominent extra renal pelvis. No renal or obstructing ureteral calculi. No bladder calculi or bladder mass.   Stomach/Bowel: The stomach, duodenum, small bowel and colon are grossly normal.   Vascular/Lymphatic: Advanced atherosclerotic calcifications involving the aorta and branch vessels but no aneurysm. No mesenteric or retroperitoneal mass or adenopathy.   Reproductive: The uterus and ovaries are unremarkable.   Other: Moderate free abdominal/free pelvic fluid likely secondary to the patient's cirrhosis.   Musculoskeletal: No significant bony findings. T12 vertebral augmentation changes are noted.   IMPRESSION: 1. Stable advanced cirrhotic changes involving the liver with associated mild splenomegaly and moderate abdominal/free pelvic fluid. No focal  hepatic lesions are identified without contrast. 2. Slight interval enlargement of the solid-appearing left renal lesion. 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 Recommend continued surveillance. 3. No acute abdominal/pelvic findings or adenopathy. 4. Advanced atherosclerotic calcifications involving the aorta and branch vessels. 5. Stable emphysematous changes and pulmonary scarring at the lung bases. 6. Emphysema and aortic atherosclerosis.   Aortic Atherosclerosis (ICD10-I70.0) and Emphysema (ICD10-J43.9).     Electronically Signed   By: Marijo Sanes M.D.   On: 10/27/2020 15:38    Above CT abdomen pelvis was personally reviewed today.  Agree with radiologic interpretation.  Assessment & Plan:    1. Renal mass Slowly enlarging left renal mass now up to 2.2 cm on most recent cross-sectional imaging  We discussed that given her multiple medical comorbidities, recent admissions, etc. as well as a very slow growth rate and small size of the lesion, conservative management with surveillance seems most appropriate.  We will have her follow-up in a year with a renal ultrasound prior given the very slow growth rate.  She is agreeable this plan.  She understands the risk of development of metastatic disease which is very low.  - US RENAL; Future   Hollice Espy, MD  [facility] [address], [place], [place] [address] [phone]

## 2020-12-20 DIAGNOSIS — E1140 Type 2 diabetes mellitus with diabetic neuropathy, unspecified: Secondary | ICD-10-CM | POA: Diagnosis not present

## 2020-12-20 DIAGNOSIS — D631 Anemia in chronic kidney disease: Secondary | ICD-10-CM | POA: Diagnosis not present

## 2020-12-20 DIAGNOSIS — Z48812 Encounter for surgical aftercare following surgery on the circulatory system: Secondary | ICD-10-CM | POA: Diagnosis not present

## 2020-12-20 DIAGNOSIS — N179 Acute kidney failure, unspecified: Secondary | ICD-10-CM | POA: Diagnosis not present

## 2020-12-20 DIAGNOSIS — I1310 Hypertensive heart and chronic kidney disease without heart failure, with stage 1 through stage 4 chronic kidney disease, or unspecified chronic kidney disease: Secondary | ICD-10-CM | POA: Diagnosis not present

## 2020-12-20 DIAGNOSIS — I442 Atrioventricular block, complete: Secondary | ICD-10-CM | POA: Diagnosis not present

## 2020-12-20 DIAGNOSIS — I495 Sick sinus syndrome: Secondary | ICD-10-CM | POA: Diagnosis not present

## 2020-12-20 DIAGNOSIS — E1122 Type 2 diabetes mellitus with diabetic chronic kidney disease: Secondary | ICD-10-CM | POA: Diagnosis not present

## 2020-12-20 DIAGNOSIS — N1832 Chronic kidney disease, stage 3b: Secondary | ICD-10-CM | POA: Diagnosis not present

## 2020-12-22 DIAGNOSIS — E1159 Type 2 diabetes mellitus with other circulatory complications: Secondary | ICD-10-CM | POA: Diagnosis not present

## 2020-12-22 DIAGNOSIS — E1140 Type 2 diabetes mellitus with diabetic neuropathy, unspecified: Secondary | ICD-10-CM | POA: Diagnosis not present

## 2020-12-22 DIAGNOSIS — E1122 Type 2 diabetes mellitus with diabetic chronic kidney disease: Secondary | ICD-10-CM | POA: Diagnosis not present

## 2020-12-22 DIAGNOSIS — E785 Hyperlipidemia, unspecified: Secondary | ICD-10-CM | POA: Diagnosis not present

## 2020-12-22 DIAGNOSIS — I1310 Hypertensive heart and chronic kidney disease without heart failure, with stage 1 through stage 4 chronic kidney disease, or unspecified chronic kidney disease: Secondary | ICD-10-CM | POA: Diagnosis not present

## 2020-12-22 DIAGNOSIS — N1832 Chronic kidney disease, stage 3b: Secondary | ICD-10-CM | POA: Diagnosis not present

## 2020-12-22 DIAGNOSIS — N179 Acute kidney failure, unspecified: Secondary | ICD-10-CM | POA: Diagnosis not present

## 2020-12-22 DIAGNOSIS — I25118 Atherosclerotic heart disease of native coronary artery with other forms of angina pectoris: Secondary | ICD-10-CM | POA: Diagnosis not present

## 2020-12-22 DIAGNOSIS — I495 Sick sinus syndrome: Secondary | ICD-10-CM | POA: Diagnosis not present

## 2020-12-22 DIAGNOSIS — D631 Anemia in chronic kidney disease: Secondary | ICD-10-CM | POA: Diagnosis not present

## 2020-12-22 DIAGNOSIS — E1169 Type 2 diabetes mellitus with other specified complication: Secondary | ICD-10-CM | POA: Diagnosis not present

## 2020-12-22 DIAGNOSIS — I152 Hypertension secondary to endocrine disorders: Secondary | ICD-10-CM | POA: Diagnosis not present

## 2020-12-22 DIAGNOSIS — I442 Atrioventricular block, complete: Secondary | ICD-10-CM | POA: Diagnosis not present

## 2020-12-22 DIAGNOSIS — Z48812 Encounter for surgical aftercare following surgery on the circulatory system: Secondary | ICD-10-CM | POA: Diagnosis not present

## 2020-12-26 DIAGNOSIS — S82831D Other fracture of upper and lower end of right fibula, subsequent encounter for closed fracture with routine healing: Secondary | ICD-10-CM | POA: Diagnosis not present

## 2020-12-27 DIAGNOSIS — Z48812 Encounter for surgical aftercare following surgery on the circulatory system: Secondary | ICD-10-CM | POA: Diagnosis not present

## 2020-12-27 DIAGNOSIS — I1310 Hypertensive heart and chronic kidney disease without heart failure, with stage 1 through stage 4 chronic kidney disease, or unspecified chronic kidney disease: Secondary | ICD-10-CM | POA: Diagnosis not present

## 2020-12-27 DIAGNOSIS — E1140 Type 2 diabetes mellitus with diabetic neuropathy, unspecified: Secondary | ICD-10-CM | POA: Diagnosis not present

## 2020-12-27 DIAGNOSIS — E1122 Type 2 diabetes mellitus with diabetic chronic kidney disease: Secondary | ICD-10-CM | POA: Diagnosis not present

## 2020-12-27 DIAGNOSIS — N1832 Chronic kidney disease, stage 3b: Secondary | ICD-10-CM | POA: Diagnosis not present

## 2020-12-27 DIAGNOSIS — N179 Acute kidney failure, unspecified: Secondary | ICD-10-CM | POA: Diagnosis not present

## 2020-12-27 DIAGNOSIS — I442 Atrioventricular block, complete: Secondary | ICD-10-CM | POA: Diagnosis not present

## 2020-12-27 DIAGNOSIS — D631 Anemia in chronic kidney disease: Secondary | ICD-10-CM | POA: Diagnosis not present

## 2020-12-27 DIAGNOSIS — I495 Sick sinus syndrome: Secondary | ICD-10-CM | POA: Diagnosis not present

## 2020-12-28 DIAGNOSIS — I1310 Hypertensive heart and chronic kidney disease without heart failure, with stage 1 through stage 4 chronic kidney disease, or unspecified chronic kidney disease: Secondary | ICD-10-CM | POA: Diagnosis not present

## 2020-12-28 DIAGNOSIS — E1140 Type 2 diabetes mellitus with diabetic neuropathy, unspecified: Secondary | ICD-10-CM | POA: Diagnosis not present

## 2020-12-28 DIAGNOSIS — I495 Sick sinus syndrome: Secondary | ICD-10-CM | POA: Diagnosis not present

## 2020-12-28 DIAGNOSIS — I442 Atrioventricular block, complete: Secondary | ICD-10-CM | POA: Diagnosis not present

## 2020-12-28 DIAGNOSIS — N179 Acute kidney failure, unspecified: Secondary | ICD-10-CM | POA: Diagnosis not present

## 2020-12-28 DIAGNOSIS — N1832 Chronic kidney disease, stage 3b: Secondary | ICD-10-CM | POA: Diagnosis not present

## 2020-12-28 DIAGNOSIS — D631 Anemia in chronic kidney disease: Secondary | ICD-10-CM | POA: Diagnosis not present

## 2020-12-28 DIAGNOSIS — Z48812 Encounter for surgical aftercare following surgery on the circulatory system: Secondary | ICD-10-CM | POA: Diagnosis not present

## 2020-12-28 DIAGNOSIS — E1122 Type 2 diabetes mellitus with diabetic chronic kidney disease: Secondary | ICD-10-CM | POA: Diagnosis not present

## 2021-02-16 DIAGNOSIS — E1122 Type 2 diabetes mellitus with diabetic chronic kidney disease: Secondary | ICD-10-CM | POA: Diagnosis not present

## 2021-02-16 DIAGNOSIS — N1832 Chronic kidney disease, stage 3b: Secondary | ICD-10-CM | POA: Diagnosis not present

## 2021-02-16 DIAGNOSIS — D631 Anemia in chronic kidney disease: Secondary | ICD-10-CM | POA: Diagnosis not present

## 2021-02-16 DIAGNOSIS — I10 Essential (primary) hypertension: Secondary | ICD-10-CM | POA: Diagnosis not present

## 2021-02-16 DIAGNOSIS — N2889 Other specified disorders of kidney and ureter: Secondary | ICD-10-CM | POA: Diagnosis not present

## 2021-02-27 DIAGNOSIS — N1832 Chronic kidney disease, stage 3b: Secondary | ICD-10-CM | POA: Diagnosis not present

## 2021-02-27 DIAGNOSIS — Z1389 Encounter for screening for other disorder: Secondary | ICD-10-CM | POA: Diagnosis not present

## 2021-02-27 DIAGNOSIS — E782 Mixed hyperlipidemia: Secondary | ICD-10-CM | POA: Diagnosis not present

## 2021-02-27 DIAGNOSIS — N2889 Other specified disorders of kidney and ureter: Secondary | ICD-10-CM | POA: Diagnosis not present

## 2021-02-27 DIAGNOSIS — E1122 Type 2 diabetes mellitus with diabetic chronic kidney disease: Secondary | ICD-10-CM | POA: Diagnosis not present

## 2021-02-27 DIAGNOSIS — I700 Atherosclerosis of aorta: Secondary | ICD-10-CM | POA: Diagnosis not present

## 2021-02-27 DIAGNOSIS — I129 Hypertensive chronic kidney disease with stage 1 through stage 4 chronic kidney disease, or unspecified chronic kidney disease: Secondary | ICD-10-CM | POA: Diagnosis not present

## 2021-02-27 DIAGNOSIS — M2550 Pain in unspecified joint: Secondary | ICD-10-CM | POA: Diagnosis not present

## 2021-02-27 DIAGNOSIS — D508 Other iron deficiency anemias: Secondary | ICD-10-CM | POA: Diagnosis not present

## 2021-02-27 DIAGNOSIS — Z0000 Encounter for general adult medical examination without abnormal findings: Secondary | ICD-10-CM | POA: Diagnosis not present

## 2021-02-27 DIAGNOSIS — K7460 Unspecified cirrhosis of liver: Secondary | ICD-10-CM | POA: Diagnosis not present

## 2021-03-01 ENCOUNTER — Encounter: Payer: Self-pay | Admitting: Oncology

## 2021-03-01 ENCOUNTER — Inpatient Hospital Stay: Payer: Medicare HMO

## 2021-03-01 ENCOUNTER — Inpatient Hospital Stay: Payer: Medicare HMO | Attending: Oncology | Admitting: Oncology

## 2021-03-01 ENCOUNTER — Other Ambulatory Visit: Payer: Self-pay

## 2021-03-01 VITALS — BP 131/51 | HR 98 | Temp 98.2°F | Resp 16 | Wt 177.9 lb

## 2021-03-01 DIAGNOSIS — Z87891 Personal history of nicotine dependence: Secondary | ICD-10-CM | POA: Insufficient documentation

## 2021-03-01 DIAGNOSIS — R5383 Other fatigue: Secondary | ICD-10-CM | POA: Insufficient documentation

## 2021-03-01 DIAGNOSIS — N1832 Chronic kidney disease, stage 3b: Secondary | ICD-10-CM | POA: Insufficient documentation

## 2021-03-01 DIAGNOSIS — Z794 Long term (current) use of insulin: Secondary | ICD-10-CM | POA: Diagnosis not present

## 2021-03-01 DIAGNOSIS — E1140 Type 2 diabetes mellitus with diabetic neuropathy, unspecified: Secondary | ICD-10-CM | POA: Insufficient documentation

## 2021-03-01 DIAGNOSIS — D631 Anemia in chronic kidney disease: Secondary | ICD-10-CM | POA: Insufficient documentation

## 2021-03-01 DIAGNOSIS — D649 Anemia, unspecified: Secondary | ICD-10-CM

## 2021-03-01 DIAGNOSIS — E1122 Type 2 diabetes mellitus with diabetic chronic kidney disease: Secondary | ICD-10-CM | POA: Insufficient documentation

## 2021-03-01 DIAGNOSIS — I129 Hypertensive chronic kidney disease with stage 1 through stage 4 chronic kidney disease, or unspecified chronic kidney disease: Secondary | ICD-10-CM | POA: Diagnosis not present

## 2021-03-01 LAB — RETICULOCYTES
Immature Retic Fract: 23.3 % — ABNORMAL HIGH (ref 2.3–15.9)
RBC.: 2.68 MIL/uL — ABNORMAL LOW (ref 3.87–5.11)
Retic Count, Absolute: 59.5 10*3/uL (ref 19.0–186.0)
Retic Ct Pct: 2.2 % (ref 0.4–3.1)

## 2021-03-01 LAB — COMPREHENSIVE METABOLIC PANEL
ALT: 13 U/L (ref 0–44)
AST: 27 U/L (ref 15–41)
Albumin: 3.5 g/dL (ref 3.5–5.0)
Alkaline Phosphatase: 59 U/L (ref 38–126)
Anion gap: 8 (ref 5–15)
BUN: 20 mg/dL (ref 8–23)
CO2: 23 mmol/L (ref 22–32)
Calcium: 8.9 mg/dL (ref 8.9–10.3)
Chloride: 106 mmol/L (ref 98–111)
Creatinine, Ser: 1.29 mg/dL — ABNORMAL HIGH (ref 0.44–1.00)
GFR, Estimated: 42 mL/min — ABNORMAL LOW (ref >60)
Glucose, Bld: 167 mg/dL — ABNORMAL HIGH (ref 70–99)
Potassium: 4.1 mmol/L (ref 3.5–5.1)
Sodium: 137 mmol/L (ref 135–145)
Total Bilirubin: 0.8 mg/dL (ref 0.3–1.2)
Total Protein: 7.1 g/dL (ref 6.5–8.1)

## 2021-03-01 LAB — CBC WITH DIFFERENTIAL/PLATELET
Abs Immature Granulocytes: 0.02 10*3/uL (ref 0.00–0.07)
Basophils Absolute: 0 10*3/uL (ref 0.0–0.1)
Basophils Relative: 1 %
Eosinophils Absolute: 0.3 10*3/uL (ref 0.0–0.5)
Eosinophils Relative: 6 %
HCT: 22.8 % — ABNORMAL LOW (ref 36.0–46.0)
Hemoglobin: 6.9 g/dL — ABNORMAL LOW (ref 12.0–15.0)
Immature Granulocytes: 0 %
Lymphocytes Relative: 14 %
Lymphs Abs: 0.7 10*3/uL (ref 0.7–4.0)
MCH: 25.9 pg — ABNORMAL LOW (ref 26.0–34.0)
MCHC: 30.3 g/dL (ref 30.0–36.0)
MCV: 85.7 fL (ref 80.0–100.0)
Monocytes Absolute: 0.3 10*3/uL (ref 0.1–1.0)
Monocytes Relative: 7 %
Neutro Abs: 3.5 10*3/uL (ref 1.7–7.7)
Neutrophils Relative %: 72 %
Platelets: 180 10*3/uL (ref 150–400)
RBC: 2.66 MIL/uL — ABNORMAL LOW (ref 3.87–5.11)
RDW: 15.9 % — ABNORMAL HIGH (ref 11.5–15.5)
WBC: 4.8 10*3/uL (ref 4.0–10.5)
nRBC: 0 % (ref 0.0–0.2)

## 2021-03-01 LAB — FOLATE: Folate: 16.2 ng/mL (ref >5.9)

## 2021-03-01 LAB — VITAMIN B12: Vitamin B-12: 1277 pg/mL — ABNORMAL HIGH (ref 180–914)

## 2021-03-01 LAB — TSH: TSH: 4.404 u[IU]/mL (ref 0.350–4.500)

## 2021-03-01 LAB — IRON AND TIBC
Iron: 24 ug/dL — ABNORMAL LOW (ref 28–170)
Saturation Ratios: 5 % — ABNORMAL LOW (ref 10.4–31.8)
TIBC: 528 ug/dL — ABNORMAL HIGH (ref 250–450)
UIBC: 504 ug/dL

## 2021-03-01 LAB — FERRITIN: Ferritin: 8 ng/mL — ABNORMAL LOW (ref 11–307)

## 2021-03-02 ENCOUNTER — Inpatient Hospital Stay: Payer: Medicare HMO

## 2021-03-02 VITALS — BP 141/75 | HR 102 | Temp 96.9°F | Resp 18

## 2021-03-02 DIAGNOSIS — D508 Other iron deficiency anemias: Secondary | ICD-10-CM

## 2021-03-02 DIAGNOSIS — Z87891 Personal history of nicotine dependence: Secondary | ICD-10-CM | POA: Diagnosis not present

## 2021-03-02 DIAGNOSIS — N1832 Chronic kidney disease, stage 3b: Secondary | ICD-10-CM | POA: Diagnosis not present

## 2021-03-02 DIAGNOSIS — I129 Hypertensive chronic kidney disease with stage 1 through stage 4 chronic kidney disease, or unspecified chronic kidney disease: Secondary | ICD-10-CM | POA: Diagnosis not present

## 2021-03-02 DIAGNOSIS — R5383 Other fatigue: Secondary | ICD-10-CM | POA: Diagnosis not present

## 2021-03-02 DIAGNOSIS — E1140 Type 2 diabetes mellitus with diabetic neuropathy, unspecified: Secondary | ICD-10-CM | POA: Diagnosis not present

## 2021-03-02 DIAGNOSIS — D631 Anemia in chronic kidney disease: Secondary | ICD-10-CM | POA: Diagnosis not present

## 2021-03-02 DIAGNOSIS — E1122 Type 2 diabetes mellitus with diabetic chronic kidney disease: Secondary | ICD-10-CM | POA: Diagnosis not present

## 2021-03-02 DIAGNOSIS — Z794 Long term (current) use of insulin: Secondary | ICD-10-CM | POA: Diagnosis not present

## 2021-03-02 LAB — KAPPA/LAMBDA LIGHT CHAINS
Kappa free light chain: 73.2 mg/L — ABNORMAL HIGH (ref 3.3–19.4)
Kappa, lambda light chain ratio: 1.71 — ABNORMAL HIGH (ref 0.26–1.65)
Lambda free light chains: 42.9 mg/L — ABNORMAL HIGH (ref 5.7–26.3)

## 2021-03-02 LAB — HAPTOGLOBIN: Haptoglobin: 110 mg/dL (ref 42–346)

## 2021-03-02 MED ORDER — SODIUM CHLORIDE 0.9 % IV SOLN
Freq: Once | INTRAVENOUS | Status: AC
  Filled 2021-03-02: qty 250

## 2021-03-02 MED ORDER — SODIUM CHLORIDE 0.9 % IV SOLN
510.0000 mg | Freq: Once | INTRAVENOUS | Status: AC
  Administered 2021-03-02: 510 mg via INTRAVENOUS
  Filled 2021-03-02: qty 17

## 2021-03-03 ENCOUNTER — Encounter: Payer: Self-pay | Admitting: Oncology

## 2021-03-03 ENCOUNTER — Other Ambulatory Visit: Payer: Self-pay | Admitting: Oncology

## 2021-03-03 NOTE — Progress Notes (Signed)
I connected with Emily Faulkner on 03/03/21 at 10:45 AM EDT by video enabled telemedicine visit and verified that I am speaking with the correct person using two identifiers.   I discussed the limitations, risks, security and privacy concerns of performing an evaluation and management service by telemedicine and the availability of in-person appointments. I also discussed with the patient that there may be a patient responsible charge related to this service. The patient expressed understanding and agreed to proceed.  Other persons participating in the visit and their role in the encounter:  none  Patient's location:  [facility] Provider's location:  home  Reason for referral: Anemia Referring provider:Dr. Holley Raring  History of present illness: Patient is a 80 year old female with a past medical history significant for stage III CKD, hypertension diabetes and left renal mass who was found to have a hemoglobin of 8.7 at nephrology office and hence referred the patient for anemia.  For a renal mass she has been followed by Dr. Erlene Quan in the masses thought to be slow-growing and conservative management was recommended.  Patient presently reports ongoing fatigue.  Denies any blood loss in her stool or urine.  Denies any dark melanotic stools.  Patient has had a complete GI work-up including endoscopy colonoscopy and small bowel endoscopy in 2019 by Dr. Vicente Males colonoscopy showed multiple bleeding colonic angioectasias treated with APC small bowel endoscopy showed nonbleeding angioectasias in the jejunum treated with APC     Review of Systems  Constitutional:  Positive for malaise/fatigue. Negative for chills, fever and weight loss.  HENT:  Negative for congestion, ear discharge and nosebleeds.   Eyes:  Negative for blurred vision.  Respiratory:  Negative for cough, hemoptysis, sputum production, shortness of breath and wheezing.   Cardiovascular:  Negative for chest pain, palpitations, orthopnea  and claudication.  Gastrointestinal:  Negative for abdominal pain, blood in stool, constipation, diarrhea, heartburn, melena, nausea and vomiting.  Genitourinary:  Negative for dysuria, flank pain, frequency, hematuria and urgency.  Musculoskeletal:  Negative for back pain, joint pain and myalgias.  Skin:  Negative for rash.  Neurological:  Negative for dizziness, tingling, focal weakness, seizures, weakness and headaches.  Endo/Heme/Allergies:  Does not bruise/bleed easily.  Psychiatric/Behavioral:  Negative for depression and suicidal ideas. The patient does not have insomnia.    No Known Allergies  Past Medical History:  Diagnosis Date   Cancer [facility])    breast cancer right mastectomy   Chronic kidney disease    Diabetes mellitus without complication (HCC)    GIB (gastrointestinal bleeding) 02/20/2018   Glaucoma    Hyperlipidemia    Hypertension    Neuropathy    Osteoarthritis    Rheumatoid arthritis ([place])    Telangiectasias     Past Surgical History:  Procedure Laterality Date   BREAST SURGERY     right mastectomy   CHOLECYSTECTOMY     COLONOSCOPY     COLONOSCOPY WITH PROPOFOL N/A 08/14/2016   Procedure: COLONOSCOPY WITH PROPOFOL;  Surgeon: Lollie Sails, MD;  Location: [facility] ENDOSCOPY;  Service: Endoscopy;  Laterality: N/A;   COLONOSCOPY WITH PROPOFOL N/A 02/22/2018   Procedure: COLONOSCOPY WITH PROPOFOL;  Surgeon: Jonathon Bellows, MD;  Location: [facility] ENDOSCOPY;  Service: Gastroenterology;  Laterality: N/A;   ESOPHAGOGASTRODUODENOSCOPY (EGD) WITH PROPOFOL N/A 08/14/2016   Procedure: ESOPHAGOGASTRODUODENOSCOPY (EGD) WITH PROPOFOL;  Surgeon: Lollie Sails, MD;  Location: [facility] ENDOSCOPY;  Service: Endoscopy;  Laterality: N/A;   ESOPHAGOGASTRODUODENOSCOPY (EGD) WITH PROPOFOL N/A 01/30/2018   Procedure: ESOPHAGOGASTRODUODENOSCOPY (EGD) WITH PROPOFOL;  Surgeon: Toledo, Benay Pike, MD;  Location: [facility] ENDOSCOPY;  Service: Gastroenterology;  Laterality: N/A;    ESOPHAGOGASTRODUODENOSCOPY (EGD) WITH PROPOFOL N/A 02/22/2018   Procedure: ESOPHAGOGASTRODUODENOSCOPY (EGD) WITH PROPOFOL;  Surgeon: Jonathon Bellows, MD;  Location: [facility] ENDOSCOPY;  Service: Gastroenterology;  Laterality: N/A;   EXCISIONAL HEMORRHOIDECTOMY     KYPHOPLASTY N/A 03/20/2018   Procedure: [id number];  Surgeon: Hessie Knows, MD;  Location: [facility] ORS;  Service: Orthopedics;  Laterality: N/A;   PACEMAKER IMPLANT N/A 10/26/2020   Procedure: PACEMAKER IMPLANT;  Surgeon: Isaias Cowman, MD;  Location: [place] CV LAB;  Service: Cardiovascular;  Laterality: N/A;    Social History   Socioeconomic History   Marital status: Married    Spouse name: Not on file   Number of children: Not on file   Years of education: Not on file   Highest education level: Not on file  Occupational History   Not on file  Tobacco Use   Smoking status: Former    Types: Cigarettes    Quit date: 07/10/2011    Years since quitting: 9.6   Smokeless tobacco: Never  Vaping Use   Vaping Use: Never used  Substance and Sexual Activity   Alcohol use: No   Drug use: No   Sexual activity: Not on file  Other Topics Concern   Not on file  Social History Narrative   Not on file   Social Determinants of Health   Financial Resource Strain: Not on file  Food Insecurity: Not on file  Transportation Needs: Not on file  Physical Activity: Not on file  Stress: Not on file  Social Connections: Not on file  Intimate Partner Violence: Not on file    Family History  Problem Relation Age of Onset   Kidney cancer Son    Bladder Cancer Neg Hx      Current Outpatient Medications:    ACCU-CHEK AVIVA PLUS test strip, , Disp: , Rfl:    ACCU-CHEK SOFTCLIX LANCETS lancets, once daily Use as instructed., Disp: , Rfl:    acetaminophen (TYLENOL) 500 MG tablet, Take by mouth., Disp: , Rfl:    Biotin 1000 MCG tablet, Take by mouth., Disp: , Rfl:    Cholecalciferol 50 MCG (2000 UT) CAPS, Take by mouth., Disp: ,  Rfl:    doxepin (SINEQUAN) 25 MG capsule, Take 25 mg by mouth daily., Disp: , Rfl:    feeding supplement, ENSURE ENLIVE, (ENSURE ENLIVE) LIQD, Take 237 mLs by mouth 2 (two) times daily between meals., Disp: 237 mL, Rfl: 12   glipiZIDE (GLUCOTROL) 5 MG tablet, Take by mouth., Disp: , Rfl:    insulin glargine (LANTUS) 100 UNIT/ML Solostar Pen, Inject 34 Units into the skin daily. (may take up to 50u based upon blood glucose reading), Disp: , Rfl:    Insulin Pen Needle 32G X 4 MM MISC, USE 1 NEEDLE SUBCUTANEOUSLY ONCE A DAY AS DIRECTED, Disp: , Rfl:    ketoconazole (NIZORAL) 2 % cream, , Disp: , Rfl:    ketoconazole (NIZORAL) 2 % shampoo, Apply 1 application topically daily. (apply to scalp and wait at least 30 minutes before rinsing), Disp: , Rfl:    losartan (COZAAR) 25 MG tablet, Take by mouth., Disp: , Rfl:    Magnesium Oxide 400 (240 Mg) MG TABS, TAKE 2 TABLETS (800 MG TOTAL) BY MOUTH DAILY., Disp: , Rfl: 11   metFORMIN (GLUCOPHAGE-XR) 500 MG 24 hr tablet, , Disp: , Rfl:    niacin (NIASPAN) 500 MG CR tablet, Take 500 mg by  mouth every evening., Disp: , Rfl: 2   simvastatin (ZOCOR) 20 MG tablet, Take 20 mg by mouth at bedtime., Disp: , Rfl: 6   sitaGLIPtin (JANUVIA) 50 MG tablet, Take 50 mg by mouth daily., Disp: , Rfl:    TRAVATAN Z 0.004 % SOLN ophthalmic solution, Place 1 drop into the left eye at bedtime., Disp: , Rfl:    vitamin B-12 (CYANOCOBALAMIN) 1000 MCG tablet, Take 1,000 mcg by mouth daily., Disp: , Rfl:    Omega-3 Fatty Acids (FISH OIL) 1000 MG CAPS, Take 1 capsule by mouth. (Patient not taking: Reported on 03/01/2021), Disp: , Rfl:    predniSONE (DELTASONE) 20 MG tablet, Take by mouth. (Patient not taking: Reported on 03/01/2021), Disp: , Rfl:    vancomycin (VANCOCIN) 125 MG capsule, , Disp: , Rfl:   No results found.  No images are attached to the encounter.   CMP Latest Ref Rng & Units 03/01/2021  Glucose 70 - 99 mg/dL 167(H)  BUN 8 - 23 mg/dL 20  Creatinine 0.44 - 1.00  mg/dL 1.29(H)  Sodium 135 - 145 mmol/L 137  Potassium 3.5 - 5.1 mmol/L 4.1  Chloride 98 - 111 mmol/L 106  CO2 22 - 32 mmol/L 23  Calcium 8.9 - 10.3 mg/dL 8.9  Total Protein 6.5 - 8.1 g/dL 7.1  Total Bilirubin 0.3 - 1.2 mg/dL 0.8  Alkaline Phos 38 - 126 U/L 59  AST 15 - 41 U/L 27  ALT 0 - 44 U/L 13   CBC Latest Ref Rng & Units 03/01/2021  WBC 4.0 - 10.5 K/uL 4.8  Hemoglobin 12.0 - 15.0 g/dL 6.9(L)  Hematocrit 36.0 - 46.0 % 22.8(L)  Platelets 150 - 400 K/uL 180     Observation/objective: Appears in no acute distress over video visit today.  Breathing is nonlabored  Assessment and plan: Patient is a 80 year old female with history of stage III CKD referred for anemia  Explained to the patient that anemia can be due to reversible causes such as iron or B12 deficiency versus anemia of chronic kidney disease which can be potentially treated with EPO.  However before considering EPO we need to make sure that her iron studies are adequate.  Today I will do a complete anemia work-up including CBC with differential, ferritin and iron studies B12 folate TSH myeloma panel and serum free light chains reticulocyte count and haptoglobin.  Right after the patient left the clinic her CBC came back with a hemoglobin of 6.9 and labs suggestive of iron deficiency.  She will therefore proceed with 5 doses of Venofer over the next 2 to 3 weeks.  I will also reach out to GI Dr. Vicente Males to see if any repeat endoscopic intervention would be warranted since she was found to have multiple bleeding angioectasias in the colon back in 2019 as well as nonbleeding angioectasias in the jejunum which may be the cause of her iron deficiency.  Follow-up instructions: I will tentatively see the patient back in 2 weeks time to discuss the results of blood work and further management  I discussed the assessment and treatment plan with the patient. The patient was provided an opportunity to ask questions and all were answered.  The patient agreed with the plan and demonstrated an understanding of the instructions.   The patient was advised to call back or seek an in-person evaluation if the symptoms worsen or if the condition fails to improve as anticipated.    Visit Diagnosis: 1. Normocytic anemia   2. Anemia  of chronic renal failure, stage 3b ([place])     Dr. Randa Evens, MD, MPH [facility] at [facility] Tel- [phone] 03/03/2021 11:22 AM

## 2021-03-06 LAB — MULTIPLE MYELOMA PANEL, SERUM
Albumin SerPl Elph-Mcnc: 3.3 g/dL (ref 2.9–4.4)
Albumin/Glob SerPl: 1.2 (ref 0.7–1.7)
Alpha 1: 0.2 g/dL (ref 0.0–0.4)
Alpha2 Glob SerPl Elph-Mcnc: 0.6 g/dL (ref 0.4–1.0)
B-Globulin SerPl Elph-Mcnc: 1 g/dL (ref 0.7–1.3)
Gamma Glob SerPl Elph-Mcnc: 1.2 g/dL (ref 0.4–1.8)
Globulin, Total: 3 g/dL (ref 2.2–3.9)
IgA: 240 mg/dL (ref 64–422)
IgG (Immunoglobin G), Serum: 1220 mg/dL (ref 586–1602)
IgM (Immunoglobulin M), Srm: 214 mg/dL (ref 26–217)
Total Protein ELP: 6.3 g/dL (ref 6.0–8.5)

## 2021-03-07 ENCOUNTER — Inpatient Hospital Stay: Payer: Medicare HMO

## 2021-03-10 ENCOUNTER — Other Ambulatory Visit: Payer: Self-pay

## 2021-03-10 ENCOUNTER — Inpatient Hospital Stay: Payer: Medicare HMO | Attending: Oncology

## 2021-03-10 VITALS — BP 149/34 | HR 90 | Temp 98.6°F | Resp 20

## 2021-03-10 DIAGNOSIS — E1122 Type 2 diabetes mellitus with diabetic chronic kidney disease: Secondary | ICD-10-CM | POA: Insufficient documentation

## 2021-03-10 DIAGNOSIS — N1832 Chronic kidney disease, stage 3b: Secondary | ICD-10-CM | POA: Insufficient documentation

## 2021-03-10 DIAGNOSIS — Z794 Long term (current) use of insulin: Secondary | ICD-10-CM | POA: Insufficient documentation

## 2021-03-10 DIAGNOSIS — I129 Hypertensive chronic kidney disease with stage 1 through stage 4 chronic kidney disease, or unspecified chronic kidney disease: Secondary | ICD-10-CM | POA: Insufficient documentation

## 2021-03-10 DIAGNOSIS — R5383 Other fatigue: Secondary | ICD-10-CM | POA: Diagnosis not present

## 2021-03-10 DIAGNOSIS — E1140 Type 2 diabetes mellitus with diabetic neuropathy, unspecified: Secondary | ICD-10-CM | POA: Diagnosis not present

## 2021-03-10 DIAGNOSIS — Z87891 Personal history of nicotine dependence: Secondary | ICD-10-CM | POA: Insufficient documentation

## 2021-03-10 DIAGNOSIS — D631 Anemia in chronic kidney disease: Secondary | ICD-10-CM | POA: Diagnosis not present

## 2021-03-10 DIAGNOSIS — D508 Other iron deficiency anemias: Secondary | ICD-10-CM

## 2021-03-10 MED ORDER — SODIUM CHLORIDE 0.9 % IV SOLN: 200.0000 mg | Freq: Once | INTRAVENOUS | Status: DC

## 2021-03-10 MED ORDER — IRON SUCROSE 20 MG/ML IV SOLN
200.0000 mg | Freq: Once | INTRAVENOUS | Status: AC
  Administered 2021-03-10: 200 mg via INTRAVENOUS
  Filled 2021-03-10: qty 10

## 2021-03-10 MED ORDER — SODIUM CHLORIDE 0.9 % IV SOLN
Freq: Once | INTRAVENOUS | Status: AC
  Filled 2021-03-10: qty 250

## 2021-03-10 NOTE — Patient Instructions (Signed)

## 2021-03-15 ENCOUNTER — Encounter: Payer: Self-pay | Admitting: Oncology

## 2021-03-15 ENCOUNTER — Other Ambulatory Visit: Payer: Self-pay

## 2021-03-15 ENCOUNTER — Inpatient Hospital Stay: Payer: Medicare HMO

## 2021-03-15 ENCOUNTER — Inpatient Hospital Stay (HOSPITAL_BASED_OUTPATIENT_CLINIC_OR_DEPARTMENT_OTHER): Payer: Medicare HMO | Admitting: Oncology

## 2021-03-15 VITALS — BP 152/70 | HR 86

## 2021-03-15 VITALS — BP 135/57 | HR 97 | Temp 98.5°F | Resp 18 | Ht 64.0 in | Wt 179.7 lb

## 2021-03-15 DIAGNOSIS — D631 Anemia in chronic kidney disease: Secondary | ICD-10-CM | POA: Diagnosis not present

## 2021-03-15 DIAGNOSIS — D508 Other iron deficiency anemias: Secondary | ICD-10-CM

## 2021-03-15 DIAGNOSIS — N1832 Chronic kidney disease, stage 3b: Secondary | ICD-10-CM | POA: Diagnosis not present

## 2021-03-15 DIAGNOSIS — R5383 Other fatigue: Secondary | ICD-10-CM | POA: Diagnosis not present

## 2021-03-15 DIAGNOSIS — I129 Hypertensive chronic kidney disease with stage 1 through stage 4 chronic kidney disease, or unspecified chronic kidney disease: Secondary | ICD-10-CM | POA: Diagnosis not present

## 2021-03-15 DIAGNOSIS — Z87891 Personal history of nicotine dependence: Secondary | ICD-10-CM | POA: Diagnosis not present

## 2021-03-15 DIAGNOSIS — E1140 Type 2 diabetes mellitus with diabetic neuropathy, unspecified: Secondary | ICD-10-CM | POA: Diagnosis not present

## 2021-03-15 DIAGNOSIS — Z794 Long term (current) use of insulin: Secondary | ICD-10-CM | POA: Diagnosis not present

## 2021-03-15 DIAGNOSIS — E1122 Type 2 diabetes mellitus with diabetic chronic kidney disease: Secondary | ICD-10-CM | POA: Diagnosis not present

## 2021-03-15 LAB — HEMOGLOBIN AND HEMATOCRIT, BLOOD
HCT: 27.8 % — ABNORMAL LOW (ref 36.0–46.0)
Hemoglobin: 8.3 g/dL — ABNORMAL LOW (ref 12.0–15.0)

## 2021-03-15 MED ORDER — SODIUM CHLORIDE 0.9 % IV SOLN: 200.0000 mg | Freq: Once | INTRAVENOUS | Status: DC

## 2021-03-15 MED ORDER — IRON SUCROSE 20 MG/ML IV SOLN
200.0000 mg | Freq: Once | INTRAVENOUS | Status: AC
  Administered 2021-03-15: 200 mg via INTRAVENOUS
  Filled 2021-03-15: qty 10

## 2021-03-15 MED ORDER — SODIUM CHLORIDE 0.9 % IV SOLN
Freq: Once | INTRAVENOUS | Status: AC
  Filled 2021-03-15: qty 250

## 2021-03-15 NOTE — Progress Notes (Signed)
SOB on exertion, tired.

## 2021-03-16 ENCOUNTER — Telehealth: Payer: Self-pay

## 2021-03-16 NOTE — Telephone Encounter (Signed)
Called patient but had to leave a voicemail letting her know that Dr. Janese Banks would like for her to be seen STAT. Therefore Dr. Vicente Males replied back stating that he would see her next week. However, since patient did not answer, I left her a voicemail letting her know that she needs an appointment with Dr. Vicente Males.

## 2021-03-17 ENCOUNTER — Inpatient Hospital Stay: Payer: Medicare HMO

## 2021-03-17 ENCOUNTER — Other Ambulatory Visit: Payer: Self-pay

## 2021-03-17 VITALS — BP 151/69 | HR 82 | Temp 97.0°F | Resp 18

## 2021-03-17 DIAGNOSIS — Z794 Long term (current) use of insulin: Secondary | ICD-10-CM | POA: Diagnosis not present

## 2021-03-17 DIAGNOSIS — N1832 Chronic kidney disease, stage 3b: Secondary | ICD-10-CM | POA: Diagnosis not present

## 2021-03-17 DIAGNOSIS — Z87891 Personal history of nicotine dependence: Secondary | ICD-10-CM | POA: Diagnosis not present

## 2021-03-17 DIAGNOSIS — E1140 Type 2 diabetes mellitus with diabetic neuropathy, unspecified: Secondary | ICD-10-CM | POA: Diagnosis not present

## 2021-03-17 DIAGNOSIS — E1122 Type 2 diabetes mellitus with diabetic chronic kidney disease: Secondary | ICD-10-CM | POA: Diagnosis not present

## 2021-03-17 DIAGNOSIS — D631 Anemia in chronic kidney disease: Secondary | ICD-10-CM | POA: Diagnosis not present

## 2021-03-17 DIAGNOSIS — D508 Other iron deficiency anemias: Secondary | ICD-10-CM

## 2021-03-17 DIAGNOSIS — R5383 Other fatigue: Secondary | ICD-10-CM | POA: Diagnosis not present

## 2021-03-17 DIAGNOSIS — I129 Hypertensive chronic kidney disease with stage 1 through stage 4 chronic kidney disease, or unspecified chronic kidney disease: Secondary | ICD-10-CM | POA: Diagnosis not present

## 2021-03-17 MED ORDER — IRON SUCROSE 20 MG/ML IV SOLN
200.0000 mg | Freq: Once | INTRAVENOUS | Status: AC
  Administered 2021-03-17: 200 mg via INTRAVENOUS

## 2021-03-17 MED ORDER — SODIUM CHLORIDE 0.9 % IV SOLN
Freq: Once | INTRAVENOUS | Status: AC
  Filled 2021-03-17: qty 250

## 2021-03-17 MED ORDER — SODIUM CHLORIDE 0.9 % IV SOLN: 200.0000 mg | Freq: Once | INTRAVENOUS | Status: DC

## 2021-03-17 NOTE — Patient Instructions (Signed)

## 2021-03-19 ENCOUNTER — Encounter: Payer: Self-pay | Admitting: Oncology

## 2021-03-19 NOTE — Progress Notes (Signed)
Hematology/Oncology Consult note [facility]  [phone] [phone]  Patient Care Team: Sofie Hartigan, MD as PCP - General (Family Medicine)   Name of the patient: Emily Faulkner  [id number]  [birth year]   Date of visit: 03/19/21  Diagnosis-iron deficiency anemia likely secondary to GI bleed  Chief complaint/ Reason for visit-routine follow-up of iron deficiency anemia  Heme/Onc history: Patient is a 80 year old female with a past medical history significant for stage III CKD, hypertension diabetes and left renal mass who was found to have a hemoglobin of 8.7 at nephrology office and hence referred the patient for anemia.  For a renal mass she has been followed by Dr. Erlene Quan in the masses thought to be slow-growing and conservative management was recommended.  Patient presently reports ongoing fatigue.  Denies any blood loss in her stool or urine.  Denies any dark melanotic stools.  Patient has had a complete GI work-up including endoscopy colonoscopy and small bowel endoscopy in 2019 by Dr. Vicente Males colonoscopy showed multiple bleeding colonic angioectasias treated with APC small bowel endoscopy showed nonbleeding angioectasias in the jejunum treated with APC  Interval history-patient still reports ongoing fatigue.  Denies any overt blood loss in her stool or urine.  Denies any dark melanotic stools  ECOG PS- 1 Pain scale- 0   Review of systems- Review of Systems  Constitutional:  Positive for malaise/fatigue. Negative for chills, fever and weight loss.  HENT:  Negative for congestion, ear discharge and nosebleeds.   Eyes:  Negative for blurred vision.  Respiratory:  Negative for cough, hemoptysis, sputum production, shortness of breath and wheezing.   Cardiovascular:  Negative for chest pain, palpitations, orthopnea and claudication.  Gastrointestinal:  Negative for abdominal pain, blood in stool, constipation, diarrhea, heartburn,  melena, nausea and vomiting.  Genitourinary:  Negative for dysuria, flank pain, frequency, hematuria and urgency.  Musculoskeletal:  Negative for back pain, joint pain and myalgias.  Skin:  Negative for rash.  Neurological:  Negative for dizziness, tingling, focal weakness, seizures, weakness and headaches.  Endo/Heme/Allergies:  Does not bruise/bleed easily.  Psychiatric/Behavioral:  Negative for depression and suicidal ideas. The patient does not have insomnia.      No Known Allergies   Past Medical History:  Diagnosis Date   Anemia    Cancer ([place])    breast cancer right mastectomy   Chronic kidney disease    Diabetes mellitus without complication ([place])    GIB (gastrointestinal bleeding) 02/20/2018   Glaucoma    Hyperlipidemia    Hypertension    Neuropathy    Osteoarthritis    Rheumatoid arthritis ([place])    Telangiectasias      Past Surgical History:  Procedure Laterality Date   BREAST SURGERY     right mastectomy   CHOLECYSTECTOMY     COLONOSCOPY     COLONOSCOPY WITH PROPOFOL N/A 08/14/2016   Procedure: COLONOSCOPY WITH PROPOFOL;  Surgeon: Lollie Sails, MD;  Location: [facility] ENDOSCOPY;  Service: Endoscopy;  Laterality: N/A;   COLONOSCOPY WITH PROPOFOL N/A 02/22/2018   Procedure: COLONOSCOPY WITH PROPOFOL;  Surgeon: Jonathon Bellows, MD;  Location: [facility] ENDOSCOPY;  Service: Gastroenterology;  Laterality: N/A;   ESOPHAGOGASTRODUODENOSCOPY (EGD) WITH PROPOFOL N/A 08/14/2016   Procedure: ESOPHAGOGASTRODUODENOSCOPY (EGD) WITH PROPOFOL;  Surgeon: Lollie Sails, MD;  Location: [facility] ENDOSCOPY;  Service: Endoscopy;  Laterality: N/A;   ESOPHAGOGASTRODUODENOSCOPY (EGD) WITH PROPOFOL N/A 01/30/2018   Procedure: ESOPHAGOGASTRODUODENOSCOPY (EGD) WITH PROPOFOL;  Surgeon: Toledo, Benay Pike, MD;  Location: [facility] ENDOSCOPY;  Service: Gastroenterology;  Laterality: N/A;   ESOPHAGOGASTRODUODENOSCOPY (EGD) WITH PROPOFOL N/A 02/22/2018   Procedure: ESOPHAGOGASTRODUODENOSCOPY (EGD) WITH PROPOFOL;   Surgeon: Jonathon Bellows, MD;  Location: [facility] ENDOSCOPY;  Service: Gastroenterology;  Laterality: N/A;   EXCISIONAL HEMORRHOIDECTOMY     KYPHOPLASTY N/A 03/20/2018   Procedure: [id number];  Surgeon: Hessie Knows, MD;  Location: [facility] ORS;  Service: Orthopedics;  Laterality: N/A;   PACEMAKER IMPLANT N/A 10/26/2020   Procedure: PACEMAKER IMPLANT;  Surgeon: Isaias Cowman, MD;  Location: [place] CV LAB;  Service: Cardiovascular;  Laterality: N/A;    Social History   Socioeconomic History   Marital status: Married    Spouse name: Not on file   Number of children: Not on file   Years of education: Not on file   Highest education level: Not on file  Occupational History   Not on file  Tobacco Use   Smoking status: Former    Types: Cigarettes    Quit date: 07/10/2011    Years since quitting: 9.6   Smokeless tobacco: Never  Vaping Use   Vaping Use: Never used  Substance and Sexual Activity   Alcohol use: No   Drug use: No   Sexual activity: Not Currently  Other Topics Concern   Not on file  Social History Narrative   Not on file   Social Determinants of Health   Financial Resource Strain: Not on file  Food Insecurity: Not on file  Transportation Needs: Not on file  Physical Activity: Not on file  Stress: Not on file  Social Connections: Not on file  Intimate Partner Violence: Not on file    Family History  Problem Relation Age of Onset   Kidney cancer Son    Bladder Cancer Neg Hx      Current Outpatient Medications:    ACCU-CHEK AVIVA PLUS test strip, , Disp: , Rfl:    ACCU-CHEK SOFTCLIX LANCETS lancets, once daily Use as instructed., Disp: , Rfl:    acetaminophen (TYLENOL) 500 MG tablet, Take 500 mg by mouth every 8 (eight) hours as needed., Disp: , Rfl:    Biotin 1000 MCG tablet, Take 1,000 mcg by mouth daily., Disp: , Rfl:    Cholecalciferol 50 MCG (2000 UT) CAPS, Take 2,000 Units by mouth daily., Disp: , Rfl:    doxepin (SINEQUAN) 25 MG capsule, Take 25  mg by mouth daily., Disp: , Rfl:    feeding supplement, ENSURE ENLIVE, (ENSURE ENLIVE) LIQD, Take 237 mLs by mouth 2 (two) times daily between meals., Disp: 237 mL, Rfl: 12   glipiZIDE (GLUCOTROL) 5 MG tablet, 5 mg daily before breakfast., Disp: , Rfl:    insulin glargine (LANTUS) 100 UNIT/ML Solostar Pen, Inject 34 Units into the skin daily. (may take up to 50u based upon blood glucose reading), Disp: , Rfl:    Insulin Pen Needle 32G X 4 MM MISC, USE 1 NEEDLE SUBCUTANEOUSLY ONCE A DAY AS DIRECTED, Disp: , Rfl:    ketoconazole (NIZORAL) 2 % shampoo, Apply 1 application topically daily. (apply to scalp and wait at least 30 minutes before rinsing), Disp: , Rfl:    losartan (COZAAR) 25 MG tablet, Take 25 mg by mouth daily., Disp: , Rfl:    Magnesium 100 MG CAPS, Take 4 capsules by mouth daily., Disp: , Rfl:    metFORMIN (GLUCOPHAGE-XR) 500 MG 24 hr tablet, Take 500 mg by mouth., Disp: , Rfl:    niacin (NIASPAN) 500 MG CR tablet, Take 500 mg by mouth every evening., Disp: , Rfl:  2   simvastatin (ZOCOR) 20 MG tablet, Take 20 mg by mouth at bedtime., Disp: , Rfl: 6   sitaGLIPtin (JANUVIA) 50 MG tablet, Take 50 mg by mouth daily., Disp: , Rfl:    TRAVATAN Z 0.004 % SOLN ophthalmic solution, Place 1 drop into both eyes at bedtime., Disp: , Rfl:    vitamin B-12 (CYANOCOBALAMIN) 1000 MCG tablet, Take 1,000 mcg by mouth daily., Disp: , Rfl:    Omega-3 Fatty Acids (FISH OIL) 1000 MG CAPS, Take 1 capsule by mouth. (Patient not taking: No sig reported), Disp: , Rfl:    vancomycin (VANCOCIN) 125 MG capsule, , Disp: , Rfl:   Physical exam:  Vitals:   03/15/21 1133  BP: (!) 135/57  Pulse: 97  Resp: 18  Temp: 98.5 F (36.9 C)  TempSrc: Tympanic  Weight: 179 lb 10.8 oz (81.5 kg)  Height: 5' 4"  (1.626 m)   Physical Exam Cardiovascular:     Rate and Rhythm: Normal rate and regular rhythm.     Heart sounds: Normal heart sounds.  Pulmonary:     Effort: Pulmonary effort is normal.     Breath sounds:  Normal breath sounds.  Abdominal:     General: Bowel sounds are normal.     Palpations: Abdomen is soft.  Skin:    General: Skin is warm and dry.  Neurological:     Mental Status: She is alert and oriented to person, place, and time.     CMP Latest Ref Rng & Units 03/01/2021  Glucose 70 - 99 mg/dL 167(H)  BUN 8 - 23 mg/dL 20  Creatinine 0.44 - 1.00 mg/dL 1.29(H)  Sodium 135 - 145 mmol/L 137  Potassium 3.5 - 5.1 mmol/L 4.1  Chloride 98 - 111 mmol/L 106  CO2 22 - 32 mmol/L 23  Calcium 8.9 - 10.3 mg/dL 8.9  Total Protein 6.5 - 8.1 g/dL 7.1  Total Bilirubin 0.3 - 1.2 mg/dL 0.8  Alkaline Phos 38 - 126 U/L 59  AST 15 - 41 U/L 27  ALT 0 - 44 U/L 13   CBC Latest Ref Rng & Units 03/15/2021  WBC 4.0 - 10.5 K/uL -  Hemoglobin 12.0 - 15.0 g/dL 8.3(L)  Hematocrit 36.0 - 46.0 % 27.8(L)  Platelets 150 - 400 K/uL -     Assessment and plan- Patient is a 80 y.o. female with a history with history of iron deficiency anemia here for routine follow-up  Results of anemia work-up from 03/01/2021 did not reveal evidence of significant iron deficiency.  Patient is currently receiving IV Venofer.  I have also reached out to GI to follow-up with her as soon as there was evidence of bleeding AVMs in the past which may be because of her iron deficiency now as well.  Hemoglobin has already improved from 6.9-8.3 presently.  CBC with differential in 3 weeks in 6 weeks and I will see her back in 6 weeks with CBC ferritin and iron studies   Visit Diagnosis 1. Iron deficiency anemia secondary to inadequate dietary iron intake      Dr. Randa Evens, MD, MPH [facility] at [facility] [phone] 03/19/2021 3:15 PM

## 2021-03-21 ENCOUNTER — Other Ambulatory Visit: Payer: Self-pay

## 2021-03-21 ENCOUNTER — Inpatient Hospital Stay: Payer: Medicare HMO

## 2021-03-21 VITALS — BP 163/76 | HR 90 | Resp 18

## 2021-03-21 DIAGNOSIS — D631 Anemia in chronic kidney disease: Secondary | ICD-10-CM | POA: Diagnosis not present

## 2021-03-21 DIAGNOSIS — E1140 Type 2 diabetes mellitus with diabetic neuropathy, unspecified: Secondary | ICD-10-CM | POA: Diagnosis not present

## 2021-03-21 DIAGNOSIS — N1832 Chronic kidney disease, stage 3b: Secondary | ICD-10-CM | POA: Diagnosis not present

## 2021-03-21 DIAGNOSIS — E1122 Type 2 diabetes mellitus with diabetic chronic kidney disease: Secondary | ICD-10-CM | POA: Diagnosis not present

## 2021-03-21 DIAGNOSIS — D508 Other iron deficiency anemias: Secondary | ICD-10-CM

## 2021-03-21 DIAGNOSIS — Z794 Long term (current) use of insulin: Secondary | ICD-10-CM | POA: Diagnosis not present

## 2021-03-21 DIAGNOSIS — R5383 Other fatigue: Secondary | ICD-10-CM | POA: Diagnosis not present

## 2021-03-21 DIAGNOSIS — Z87891 Personal history of nicotine dependence: Secondary | ICD-10-CM | POA: Diagnosis not present

## 2021-03-21 DIAGNOSIS — I129 Hypertensive chronic kidney disease with stage 1 through stage 4 chronic kidney disease, or unspecified chronic kidney disease: Secondary | ICD-10-CM | POA: Diagnosis not present

## 2021-03-21 MED ORDER — IRON SUCROSE 20 MG/ML IV SOLN
200.0000 mg | Freq: Once | INTRAVENOUS | Status: AC
  Administered 2021-03-21: 200 mg via INTRAVENOUS
  Filled 2021-03-21: qty 10

## 2021-03-21 MED ORDER — SODIUM CHLORIDE 0.9 % IV SOLN: 200.0000 mg | Freq: Once | INTRAVENOUS | Status: DC

## 2021-03-21 MED ORDER — SODIUM CHLORIDE 0.9 % IV SOLN
Freq: Once | INTRAVENOUS | Status: AC
  Filled 2021-03-21: qty 250

## 2021-03-24 NOTE — Telephone Encounter (Signed)
Called patient again and she stated that she had heard my voicemail but forgot to call me back. However, I told her that Dr. Janese Banks wanted her to be seen STAT, therefore, I told her that Dr. Marius Ditch could see her on Monday 03/27/2021 and she stated that she did not want to come to [place]. She stated that she would only go to [place]. So I went ahead and added her for Friday 03/31/2021 at 9:15 AM and she agreed.

## 2021-03-27 ENCOUNTER — Ambulatory Visit: Payer: Medicare HMO | Admitting: Gastroenterology

## 2021-03-28 DIAGNOSIS — R001 Bradycardia, unspecified: Secondary | ICD-10-CM | POA: Diagnosis not present

## 2021-03-29 ENCOUNTER — Other Ambulatory Visit: Payer: Self-pay

## 2021-03-31 ENCOUNTER — Other Ambulatory Visit: Payer: Self-pay

## 2021-03-31 ENCOUNTER — Encounter: Payer: Self-pay | Admitting: Gastroenterology

## 2021-03-31 ENCOUNTER — Ambulatory Visit: Payer: Medicare HMO | Admitting: Gastroenterology

## 2021-03-31 VITALS — BP 167/68 | HR 99 | Temp 98.5°F | Ht 64.0 in | Wt 172.2 lb

## 2021-03-31 DIAGNOSIS — R140 Abdominal distension (gaseous): Secondary | ICD-10-CM

## 2021-03-31 DIAGNOSIS — D500 Iron deficiency anemia secondary to blood loss (chronic): Secondary | ICD-10-CM | POA: Diagnosis not present

## 2021-03-31 DIAGNOSIS — K5520 Angiodysplasia of colon without hemorrhage: Secondary | ICD-10-CM | POA: Diagnosis not present

## 2021-03-31 DIAGNOSIS — D509 Iron deficiency anemia, unspecified: Secondary | ICD-10-CM

## 2021-03-31 DIAGNOSIS — K529 Noninfective gastroenteritis and colitis, unspecified: Secondary | ICD-10-CM

## 2021-03-31 NOTE — Progress Notes (Signed)
Emily Darby, MD [address]  [place]  [place], [place] [address]  Main: [phone]  Fax: [phone]    Gastroenterology Consultation  Referring Provider:     Sofie Hartigan, MD Primary Care Physician:  Sofie Hartigan, MD Primary Gastroenterologist:  Dr. Gustavo Lah Reason for Consultation:     Chronic iron deficiency anemia, AVMs        HPI:   Emily Faulkner is a 80 y.o. female referred by Dr. Ellison Hughs [name], MD  for consultation & management of chronic iron deficiency anemia.  Patient is functionally independent, chronic iron deficiency anemia, on oral iron therapy, diabetes, hypertension, hyperlipidemia, cirrhosis of liver, well compensated who was admitted to [facility] on 02/21/2018 secondary to severe symptomatic anemia, hemoglobin 6.3 and melena.  She was also taking oral iron.  During this admission, patient underwent push enteroscopy as well as colonoscopy which revealed small bowel AVMs as well as several AVMs in the ascending colon and cecum which were all treated with APC. She was also admitted in last week of July 2019 secondary to severe symptomatic anemia, underwent EGD which was unremarkable except for mild portal hypertensive gastropathy. Patient previously underwent EGD and colonoscopy in 08/2016, revealed nonbleeding AVMs in the ascending colon, left untreated. She never had a video capsule endoscopy performed.  Follow-up visit 12/01/2020 Patient is here for follow-up of C. difficile infection.  She was recently hospitalized secondary to diarrhea, dehydration that has resulted in AKI.  She also had a pacemaker placement during that hospitalization because she presented with syncope secondary to third-degree AV block.  Patient was discharged home on 10 days course of oral vancomycin which she has completed.  She reports that her diarrhea is significantly improved from several episodes of watery bowel movements to 3-4 episodes of soft pudding like brown bowel  movements.  She denies any abdominal pain, reports good appetite, denies fever, chills, nausea or vomiting.  Patient deferred video capsule endoscopy in the past for work-up of iron deficiency anemia  Follow-up visit 03/31/2021 Patient is requested an urgent appointment by Dr. Janese Banks, hematologist due to worsening anemia, her hemoglobin dropped to 6.9 on 03/01/2021, ferritin 8.  She received 5 doses of Venofer.  Hemoglobin improved to 8.3 which is her new baseline.  Patient is experiencing ongoing severe fatigue.  Her other concerns are postprandial upper abdominal discomfort, bloating, urgency as well as loose stools.  When she was taking fish oil, it would lead to severe diarrhea.  Patient is accompanied by her son today.  NSAIDs: None   Antiplts/Anticoagulants/Anti thrombotics: Aspirin 81  GI Procedures:   Small bowel endoscopy 02/22/2018 - Three non-bleeding angioectasias in the jejunum. Treated with argon plasma coagulation (APC). - Normal esophagus. - Normal stomach. - No specimens collected.  Colonoscopy 02/22/2018 - Preparation of the colon was fair. - Multiple bleeding colonic angioectasias. Treated with argon plasma coagulation (APC). - The examination was otherwise normal on direct and retroflexion views. - No specimens collected.  EGD 01/30/2018 by Dr. Alice Reichert - Widely patent and non-obstructing Schatzki ring. - Portal hypertensive gastropathy. - Normal examined duodenum. - The examination was otherwise normal. - No specimens collected.  EGD and colonoscopy 08/2016 by Dr. Gustavo Lah for iron deficiency anemia AVMs, nonbleeding in ascending colon were detected, not treated DIAGNOSIS:  A. STOMACH, ANTRUM AND BODY; COLD BIOPSY:  - ANTRAL AND OXYNTIC MUCOSA WITH MODERATE CHRONIC ACTIVE GASTRITIS,  HELICOBACTER PYLORI ASSOCIATED.  - NEGATIVE FOR DYSPLASIA AND MALIGNANCY.   B.  GE JUNCTION; COLD BIOPSY:  - REFLUX GASTROESOPHAGITIS.  - NEGATIVE FOR GOBLET CELLS, DYSPLASIA AND  MALIGNANCY.   C. COLON, RANDOM RIGHT; COLD BIOPSY:  - NO PATHOLOGIC CHANGE.   D. COLON, RANDOM LEFT; COLD BIOPSY:  - NO PATHOLOGIC CHANGE.   E.  RECTUM POLYP; COLD BIOPSY:  - POLYPOID SQUAMOCOLUMNAR (ANORECTAL) MUCOSA.  - PROMINENT LYMPHOID AGGREGATE.  - NEGATIVE FOR DYSPLASIA AND MALIGNANCY.  - MULTIPLE DEEPER LEVELS WERE EXAMINED.   Past Medical History:  Diagnosis Date   Anemia    Cancer ([place])    breast cancer right mastectomy   Chronic kidney disease    Diabetes mellitus without complication ([place])    GIB (gastrointestinal bleeding) 02/20/2018   Glaucoma    Hyperlipidemia    Hypertension    Neuropathy    Osteoarthritis    Rheumatoid arthritis ([place])    Telangiectasias     Past Surgical History:  Procedure Laterality Date   BREAST SURGERY     right mastectomy   CHOLECYSTECTOMY     COLONOSCOPY     COLONOSCOPY WITH PROPOFOL N/A 08/14/2016   Procedure: COLONOSCOPY WITH PROPOFOL;  Surgeon: Lollie Sails, MD;  Location: [facility] ENDOSCOPY;  Service: Endoscopy;  Laterality: N/A;   COLONOSCOPY WITH PROPOFOL N/A 02/22/2018   Procedure: COLONOSCOPY WITH PROPOFOL;  Surgeon: Jonathon Bellows, MD;  Location: [facility] ENDOSCOPY;  Service: Gastroenterology;  Laterality: N/A;   ESOPHAGOGASTRODUODENOSCOPY (EGD) WITH PROPOFOL N/A 08/14/2016   Procedure: ESOPHAGOGASTRODUODENOSCOPY (EGD) WITH PROPOFOL;  Surgeon: Lollie Sails, MD;  Location: [facility] ENDOSCOPY;  Service: Endoscopy;  Laterality: N/A;   ESOPHAGOGASTRODUODENOSCOPY (EGD) WITH PROPOFOL N/A 01/30/2018   Procedure: ESOPHAGOGASTRODUODENOSCOPY (EGD) WITH PROPOFOL;  Surgeon: Toledo, Benay Pike, MD;  Location: [facility] ENDOSCOPY;  Service: Gastroenterology;  Laterality: N/A;   ESOPHAGOGASTRODUODENOSCOPY (EGD) WITH PROPOFOL N/A 02/22/2018   Procedure: ESOPHAGOGASTRODUODENOSCOPY (EGD) WITH PROPOFOL;  Surgeon: Jonathon Bellows, MD;  Location: [facility] ENDOSCOPY;  Service: Gastroenterology;  Laterality: N/A;   EXCISIONAL HEMORRHOIDECTOMY     KYPHOPLASTY N/A  03/20/2018   Procedure: [id number];  Surgeon: Hessie Knows, MD;  Location: [facility] ORS;  Service: Orthopedics;  Laterality: N/A;   PACEMAKER IMPLANT N/A 10/26/2020   Procedure: PACEMAKER IMPLANT;  Surgeon: Isaias Cowman, MD;  Location: [place] CV LAB;  Service: Cardiovascular;  Laterality: N/A;    Current Outpatient Medications:    ACCU-CHEK AVIVA PLUS test strip, , Disp: , Rfl:    ACCU-CHEK SOFTCLIX LANCETS lancets, once daily Use as instructed., Disp: , Rfl:    acetaminophen (TYLENOL) 500 MG tablet, Take 500 mg by mouth every 8 (eight) hours as needed., Disp: , Rfl:    alendronate (FOSAMAX) 70 MG tablet, TAKE 1 TABLET BY MOUTH EVERY 7DAYS WITH A FULL GLASS OF WATER. DO NOT LIE DOWN FOR 30 MINUTES, Disp: , Rfl:    Biotin 1000 MCG tablet, Take 1,000 mcg by mouth daily., Disp: , Rfl:    Cholecalciferol 50 MCG (2000 UT) CAPS, Take 2,000 Units by mouth daily., Disp: , Rfl:    doxepin (SINEQUAN) 25 MG capsule, Take 25 mg by mouth daily., Disp: , Rfl:    feeding supplement, ENSURE ENLIVE, (ENSURE ENLIVE) LIQD, Take 237 mLs by mouth 2 (two) times daily between meals., Disp: 237 mL, Rfl: 12   glipiZIDE (GLUCOTROL) 5 MG tablet, 5 mg daily before breakfast., Disp: , Rfl:    insulin glargine (LANTUS) 100 UNIT/ML Solostar Pen, Inject 34 Units into the skin daily. (may take up to 50u based upon blood glucose reading), Disp: , Rfl:    Insulin  Pen Needle 32G X 4 MM MISC, USE 1 NEEDLE SUBCUTANEOUSLY ONCE A DAY AS DIRECTED, Disp: , Rfl:    ketoconazole (NIZORAL) 2 % shampoo, Apply 1 application topically daily. (apply to scalp and wait at least 30 minutes before rinsing), Disp: , Rfl:    losartan (COZAAR) 25 MG tablet, Take 25 mg by mouth daily., Disp: , Rfl:    Magnesium 100 MG CAPS, Take 4 capsules by mouth daily., Disp: , Rfl:    metFORMIN (GLUCOPHAGE-XR) 500 MG 24 hr tablet, Take 500 mg by mouth., Disp: , Rfl:    niacin (NIASPAN) 500 MG CR tablet, Take 500 mg by mouth every evening., Disp: ,  Rfl: 2   Omega-3 Fatty Acids (FISH OIL) 1000 MG CAPS, Take 1 capsule by mouth., Disp: , Rfl:    simvastatin (ZOCOR) 20 MG tablet, Take 20 mg by mouth at bedtime., Disp: , Rfl: 6   sitaGLIPtin (JANUVIA) 50 MG tablet, Take 50 mg by mouth daily., Disp: , Rfl:    TRAVATAN Z 0.004 % SOLN ophthalmic solution, Place 1 drop into both eyes at bedtime., Disp: , Rfl:    vancomycin (VANCOCIN) 125 MG capsule, , Disp: , Rfl:    vitamin B-12 (CYANOCOBALAMIN) 1000 MCG tablet, Take 1,000 mcg by mouth daily., Disp: , Rfl:    Family History  Problem Relation Age of Onset   Kidney cancer Son    Bladder Cancer Neg Hx      Social History   Tobacco Use   Smoking status: Former    Types: Cigarettes    Quit date: 07/10/2011    Years since quitting: 9.7   Smokeless tobacco: Never  Vaping Use   Vaping Use: Never used  Substance Use Topics   Alcohol use: No   Drug use: No    Allergies as of 03/31/2021   (No Known Allergies)    Review of Systems:    All systems reviewed and negative except where noted in HPI.   Physical Exam:  BP (!) 167/68 (BP Location: Left Arm, Patient Position: Sitting, Cuff Size: Normal)   Pulse 99   Temp 98.5 F (36.9 C) (Temporal)   Ht 5' 4"  (1.626 m)   Wt 172 lb 3.2 oz (78.1 kg)   BMI 29.56 kg/m  No LMP recorded. Patient is postmenopausal.  General:   Alert,  Well-developed, well-nourished, pleasant and cooperative in NAD Head:  Normocephalic and atraumatic. Eyes:  Sclera clear, no icterus.   Conjunctiva pink. Ears:  Normal auditory acuity. Nose:  No deformity, discharge, or lesions. Mouth:  No deformity or lesions,oropharynx pink & moist. Neck:  Supple; no masses or thyromegaly. Lungs:  Respirations even and unlabored.  Clear throughout to auscultation.   No wheezes, crackles, or rhonchi. No acute distress. Heart:  Regular rate and rhythm; no murmurs, clicks, rubs, or gallops. Abdomen:  Normal bowel sounds. Soft, obese, mild upper abdominal tenderness, mildly  distended without masses, hepatosplenomegaly or hernias noted.  No guarding or rebound tenderness.   Rectal: Not performed Msk:  Symmetrical without gross deformities. Good, equal movement & strength bilaterally. Pulses:  Normal pulses noted. Extremities:  No clubbing or edema.  No cyanosis. Neurologic:  Alert and oriented x3;  grossly normal neurologically. Skin:  Intact without significant lesions or rashes. No jaundice. Psych:  Alert and cooperative. Normal mood and affect.  Imaging Studies: Reviewed  Assessment and Plan:   GIULIETTA PROKOP is a 80 y.o. Caucasian female with metabolic syndrome, well compensated cirrhosis, probably NASH in etiology,  chronic iron deficiency anemia secondary to chronic blood loss from small bowel and colonic AVMs.  She underwent small bowel endoscopy and colonoscopy, AVMs in jejunum, AC and cecum that were detected were cauterized Recent hospitalization secondary to C. difficile infection s/p 10 days course of oral vancomycin  Iron deficiency anemia secondary to chronic blood loss from small bowel and colonic AVMs Disease and acute on chronic anemia, likely secondary to recurrent bleeding from AVMs S/p IV iron therapy, improvement in hemoglobin -No evidence of B12 or folate deficiency, -Today, I have discussed with her to repeat push enteroscopy and she is agreeable.  She also has history of colonic AVMs, she might need colonoscopy if push enteroscopy does not detect any small bowel AVMs  Compensated cirrhosis, probably NASH in etiology Portal hypertension: Manifested as mild portal hypertensive gastropathy and splenomegaly No evidence of varices based on EGD from 01/2018 Patient is euvolemic, no evidence of ascites HCC screening: Up-to-date on imaging, AFP negative, recommend right upper quadrant ultrasound and serum AFP levels during next visit PSE none  Abdominal bloating, postprandial urgency and diarrhea Check pancreatic fecal elastase levels Trial  of Creon, samples provided  Follow up in 3 months   Emily Darby, MD Is just 80 year old

## 2021-03-31 NOTE — Patient Instructions (Signed)
Take 2 capsules with the first bite of each meal and 1 capsule with the first bite of each snack  gave samples please let us know how they work

## 2021-04-04 DIAGNOSIS — K529 Noninfective gastroenteritis and colitis, unspecified: Secondary | ICD-10-CM | POA: Diagnosis not present

## 2021-04-04 DIAGNOSIS — R140 Abdominal distension (gaseous): Secondary | ICD-10-CM | POA: Diagnosis not present

## 2021-04-05 ENCOUNTER — Inpatient Hospital Stay: Payer: Medicare HMO

## 2021-04-05 DIAGNOSIS — N1832 Chronic kidney disease, stage 3b: Secondary | ICD-10-CM | POA: Diagnosis not present

## 2021-04-05 DIAGNOSIS — E1140 Type 2 diabetes mellitus with diabetic neuropathy, unspecified: Secondary | ICD-10-CM | POA: Diagnosis not present

## 2021-04-05 DIAGNOSIS — Z87891 Personal history of nicotine dependence: Secondary | ICD-10-CM | POA: Diagnosis not present

## 2021-04-05 DIAGNOSIS — E1122 Type 2 diabetes mellitus with diabetic chronic kidney disease: Secondary | ICD-10-CM | POA: Diagnosis not present

## 2021-04-05 DIAGNOSIS — D631 Anemia in chronic kidney disease: Secondary | ICD-10-CM | POA: Diagnosis not present

## 2021-04-05 DIAGNOSIS — D508 Other iron deficiency anemias: Secondary | ICD-10-CM

## 2021-04-05 DIAGNOSIS — Z794 Long term (current) use of insulin: Secondary | ICD-10-CM | POA: Diagnosis not present

## 2021-04-05 DIAGNOSIS — I129 Hypertensive chronic kidney disease with stage 1 through stage 4 chronic kidney disease, or unspecified chronic kidney disease: Secondary | ICD-10-CM | POA: Diagnosis not present

## 2021-04-05 DIAGNOSIS — R5383 Other fatigue: Secondary | ICD-10-CM | POA: Diagnosis not present

## 2021-04-05 LAB — CBC WITH DIFFERENTIAL/PLATELET
Abs Immature Granulocytes: 0.03 10*3/uL (ref 0.00–0.07)
Basophils Absolute: 0.1 10*3/uL (ref 0.0–0.1)
Basophils Relative: 1 %
Eosinophils Absolute: 0.3 10*3/uL (ref 0.0–0.5)
Eosinophils Relative: 4 %
HCT: 35.5 % — ABNORMAL LOW (ref 36.0–46.0)
Hemoglobin: 11.1 g/dL — ABNORMAL LOW (ref 12.0–15.0)
Immature Granulocytes: 0 %
Lymphocytes Relative: 12 %
Lymphs Abs: 1 10*3/uL (ref 0.7–4.0)
MCH: 29 pg (ref 26.0–34.0)
MCHC: 31.3 g/dL (ref 30.0–36.0)
MCV: 92.7 fL (ref 80.0–100.0)
Monocytes Absolute: 0.7 10*3/uL (ref 0.1–1.0)
Monocytes Relative: 9 %
Neutro Abs: 6.2 10*3/uL (ref 1.7–7.7)
Neutrophils Relative %: 74 %
Platelets: 230 10*3/uL (ref 150–400)
RBC: 3.83 MIL/uL — ABNORMAL LOW (ref 3.87–5.11)
RDW: 22 % — ABNORMAL HIGH (ref 11.5–15.5)
WBC: 8.4 10*3/uL (ref 4.0–10.5)
nRBC: 0 % (ref 0.0–0.2)

## 2021-04-07 ENCOUNTER — Encounter: Payer: Self-pay | Admitting: Gastroenterology

## 2021-04-07 LAB — PANCREATIC ELASTASE, FECAL: Pancreatic Elastase, Fecal: 497 ug Elast./g (ref >200)

## 2021-04-10 ENCOUNTER — Telehealth: Payer: Self-pay

## 2021-04-10 DIAGNOSIS — Z8619 Personal history of other infectious and parasitic diseases: Secondary | ICD-10-CM

## 2021-04-10 NOTE — Telephone Encounter (Signed)
Patient verbalized understanding of results . She states she will come pick up stool kit today or tomorrow. She states the creon samples did not help her symptoms. Informed patient they would not since her pancreatic enzymes came back normal  

## 2021-04-10 NOTE — Telephone Encounter (Signed)
-----   Message from Lin Landsman, MD sent at 04/07/2021 12:28 PM EDT ----- Pancreatic fecal elastase came back normal.  Recommend GI profile PCR to rule out C. difficile given her prior history of C. difficile infection in April 2022  [place]

## 2021-04-11 ENCOUNTER — Encounter: Payer: Self-pay | Admitting: Gastroenterology

## 2021-04-12 ENCOUNTER — Encounter
Admission: RE | Disposition: A | Discharge status: Home / Self Care | Payer: Self-pay | Source: Home / Self Care | Attending: Gastroenterology

## 2021-04-12 ENCOUNTER — Other Ambulatory Visit: Payer: Self-pay

## 2021-04-12 ENCOUNTER — Ambulatory Visit
Admission: RE | Admit: 2021-04-12 | Discharge: 2021-04-12 | Disposition: A | Discharge status: Home / Self Care | Payer: Medicare HMO | Attending: Gastroenterology | Admitting: Gastroenterology

## 2021-04-12 ENCOUNTER — Ambulatory Visit: Payer: Medicare HMO | Admitting: Anesthesiology

## 2021-04-12 DIAGNOSIS — R195 Other fecal abnormalities: Secondary | ICD-10-CM

## 2021-04-12 DIAGNOSIS — D649 Anemia, unspecified: Secondary | ICD-10-CM | POA: Diagnosis not present

## 2021-04-12 DIAGNOSIS — E7800 Pure hypercholesterolemia, unspecified: Secondary | ICD-10-CM | POA: Diagnosis not present

## 2021-04-12 DIAGNOSIS — K31811 Angiodysplasia of stomach and duodenum with bleeding: Secondary | ICD-10-CM | POA: Insufficient documentation

## 2021-04-12 DIAGNOSIS — Z7983 Long term (current) use of bisphosphonates: Secondary | ICD-10-CM | POA: Diagnosis not present

## 2021-04-12 DIAGNOSIS — Z853 Personal history of malignant neoplasm of breast: Secondary | ICD-10-CM | POA: Diagnosis not present

## 2021-04-12 DIAGNOSIS — Z87891 Personal history of nicotine dependence: Secondary | ICD-10-CM | POA: Diagnosis not present

## 2021-04-12 DIAGNOSIS — E1140 Type 2 diabetes mellitus with diabetic neuropathy, unspecified: Secondary | ICD-10-CM | POA: Diagnosis not present

## 2021-04-12 DIAGNOSIS — Z9049 Acquired absence of other specified parts of digestive tract: Secondary | ICD-10-CM | POA: Diagnosis not present

## 2021-04-12 DIAGNOSIS — Z79899 Other long term (current) drug therapy: Secondary | ICD-10-CM | POA: Diagnosis not present

## 2021-04-12 DIAGNOSIS — K922 Gastrointestinal hemorrhage, unspecified: Secondary | ICD-10-CM

## 2021-04-12 DIAGNOSIS — K5521 Angiodysplasia of colon with hemorrhage: Secondary | ICD-10-CM | POA: Diagnosis not present

## 2021-04-12 DIAGNOSIS — K5520 Angiodysplasia of colon without hemorrhage: Secondary | ICD-10-CM

## 2021-04-12 DIAGNOSIS — Z7984 Long term (current) use of oral hypoglycemic drugs: Secondary | ICD-10-CM | POA: Insufficient documentation

## 2021-04-12 DIAGNOSIS — Z794 Long term (current) use of insulin: Secondary | ICD-10-CM | POA: Diagnosis not present

## 2021-04-12 DIAGNOSIS — E1122 Type 2 diabetes mellitus with diabetic chronic kidney disease: Secondary | ICD-10-CM | POA: Diagnosis not present

## 2021-04-12 DIAGNOSIS — D500 Iron deficiency anemia secondary to blood loss (chronic): Secondary | ICD-10-CM | POA: Diagnosis not present

## 2021-04-12 DIAGNOSIS — D509 Iron deficiency anemia, unspecified: Secondary | ICD-10-CM | POA: Diagnosis not present

## 2021-04-12 DIAGNOSIS — K2940 Chronic atrophic gastritis without bleeding: Secondary | ICD-10-CM | POA: Diagnosis not present

## 2021-04-12 DIAGNOSIS — I129 Hypertensive chronic kidney disease with stage 1 through stage 4 chronic kidney disease, or unspecified chronic kidney disease: Secondary | ICD-10-CM | POA: Diagnosis not present

## 2021-04-12 HISTORY — PX: ENTEROSCOPY: SHX5533

## 2021-04-12 LAB — GLUCOSE, CAPILLARY: Glucose-Capillary: 161 mg/dL — ABNORMAL HIGH (ref 70–99)

## 2021-04-12 SURGERY — ENTEROSCOPY
Anesthesia: General

## 2021-04-12 MED ORDER — OMEPRAZOLE 40 MG PO CPDR: 40.0000 mg | DELAYED_RELEASE_CAPSULE | Freq: Every day | ORAL | 3 refills | Status: DC

## 2021-04-12 MED ORDER — PROPOFOL 500 MG/50ML IV EMUL
INTRAVENOUS | Status: DC | PRN
  Administered 2021-04-12: 50 ug/kg/min via INTRAVENOUS

## 2021-04-12 MED ORDER — SODIUM CHLORIDE 0.9 % IV SOLN: INTRAVENOUS | Status: DC

## 2021-04-12 MED ORDER — PROPOFOL 10 MG/ML IV BOLUS
INTRAVENOUS | Status: DC | PRN
  Administered 2021-04-12: 20 mg via INTRAVENOUS
  Administered 2021-04-12: 30 mg via INTRAVENOUS

## 2021-04-12 MED ORDER — PROPOFOL 500 MG/50ML IV EMUL
INTRAVENOUS | Status: AC
  Filled 2021-04-12: qty 50

## 2021-04-12 MED ORDER — FENTANYL CITRATE (PF) 100 MCG/2ML IJ SOLN
INTRAMUSCULAR | Status: AC
  Filled 2021-04-12: qty 2

## 2021-04-12 MED ORDER — LIDOCAINE HCL (CARDIAC) PF 100 MG/5ML IV SOSY
PREFILLED_SYRINGE | INTRAVENOUS | Status: DC | PRN
  Administered 2021-04-12: 50 mg via INTRAVENOUS

## 2021-04-12 MED ORDER — LIDOCAINE HCL (PF) 1 % IJ SOLN
INTRAMUSCULAR | Status: AC
  Filled 2021-04-12: qty 2

## 2021-04-12 NOTE — Transfer of Care (Signed)
Immediate Anesthesia Transfer of Care Note  Patient: Emily Faulkner  Procedure(s) Performed: ENTEROSCOPY  Patient Location: PACU  Anesthesia Type:General  Level of Consciousness: sedated  Airway & Oxygen Therapy: Patient Spontanous Breathing and Patient connected to nasal cannula oxygen  Post-op Assessment: Report given to RN and Post -op Vital signs reviewed and stable  Post vital signs: Reviewed and stable  Last Vitals:  Vitals Value Taken Time  BP 116/99 04/12/21 1150  Temp    Pulse 92 04/12/21 1155  Resp 29 04/12/21 1155  SpO2 90 % 04/12/21 1155  Vitals shown include unvalidated device data.  Last Pain:  Vitals:   04/12/21 1150  TempSrc:   PainSc: 0-No pain         Complications: No notable events documented.

## 2021-04-12 NOTE — H&P (Signed)
[name], MD [address]  [place]  [place], [place] [address]  Main: [phone]  Fax: [phone] Pager: [phone]  Primary Care Physician:  Sofie Hartigan, MD Primary Gastroenterologist:  Dr. [name]  Pre-Procedure History & Physical: HPI:  Emily Faulkner is a 80 y.o. female is here for an push enteroscopy.   Past Medical History:  Diagnosis Date   Acute kidney failure ([place]) 11/16/2020   AKI (acute kidney injury) ([place]) 03/17/2018   Anemia    Cancer ([facility])    breast cancer right mastectomy   Diabetes mellitus without complication ([place])    GIB (gastrointestinal bleeding) 02/20/2018   Glaucoma    Hyperkalemia 10/24/2020   Hyperlipidemia    Hypertension    Neuropathy    Odynophagia 10/24/2020   Osteoarthritis    Rheumatoid arthritis ([place])    Syncope and collapse 10/24/2020   Telangiectasias     Past Surgical History:  Procedure Laterality Date   BREAST SURGERY     right mastectomy   CHOLECYSTECTOMY     COLONOSCOPY     COLONOSCOPY WITH PROPOFOL N/A 08/14/2016   Procedure: COLONOSCOPY WITH PROPOFOL;  Surgeon: Lollie Sails, MD;  Location[facility] ENDOSCOPY;  Service: Endoscopy;  Laterality: N/A;   COLONOSCOPY WITH PROPOFOL N/A 02/22/2018   Procedure: COLONOSCOPY WITH PROPOFOL;  Surgeon: Jonathon Bellows, MD;  Location: [facility] ENDOSCOPY;  Service: Gastroenterology;  Laterality: N/A;   ESOPHAGOGASTRODUODENOSCOPY (EGD) WITH PROPOFOL N/A 08/14/2016   Procedure: ESOPHAGOGASTRODUODENOSCOPY (EGD) WITH PROPOFOL;  Surgeon: Lollie Sails, MD;  Location: [facility] ENDOSCOPY;  Service: Endoscopy;  Laterality: N/A;   ESOPHAGOGASTRODUODENOSCOPY (EGD) WITH PROPOFOL N/A 01/30/2018   Procedure: ESOPHAGOGASTRODUODENOSCOPY (EGD) WITH PROPOFOL;  Surgeon: Toledo, Benay Pike, MD;  Location: [facility] ENDOSCOPY;  Service: Gastroenterology;  Laterality: N/A;   ESOPHAGOGASTRODUODENOSCOPY (EGD) WITH PROPOFOL N/A 02/22/2018   Procedure: ESOPHAGOGASTRODUODENOSCOPY (EGD) WITH PROPOFOL;  Surgeon:  Jonathon Bellows, MD;  Location: [facility] ENDOSCOPY;  Service: Gastroenterology;  Laterality: N/A;   EXCISIONAL HEMORRHOIDECTOMY     KYPHOPLASTY N/A 03/20/2018   Procedure: [id number];  Surgeon: Hessie Knows, MD;  Location: [facility] ORS;  Service: Orthopedics;  Laterality: N/A;   PACEMAKER IMPLANT N/A 10/26/2020   Procedure: PACEMAKER IMPLANT;  Surgeon: Isaias Cowman, MD;  Location: [place] CV LAB;  Service: Cardiovascular;  Laterality: N/A;    Prior to Admission medications   Medication Sig Start Date End Date Taking? Authorizing Provider  ACCU-CHEK AVIVA PLUS test strip  03/28/18  Yes [provider]  ACCU-CHEK SOFTCLIX LANCETS lancets once daily Use as instructed. 04/14/18  Yes [provider]  acetaminophen (TYLENOL) 500 MG tablet Take 500 mg by mouth every 8 (eight) hours as needed.   Yes [provider]  alendronate (FOSAMAX) 70 MG tablet TAKE 1 TABLET BY MOUTH EVERY 7DAYS WITH A FULL GLASS OF WATER. DO NOT LIE DOWN FOR 30 MINUTES 03/21/21  Yes [provider]  Biotin 1000 MCG tablet Take 1,000 mcg by mouth daily.   Yes [provider]  Cholecalciferol 50 MCG (2000 UT) CAPS Take 2,000 Units by mouth daily.   Yes [provider]  doxepin (SINEQUAN) 25 MG capsule Take 25 mg by mouth daily. 09/05/20  Yes [provider]  glipiZIDE (GLUCOTROL) 5 MG tablet 5 mg daily before breakfast. 11/28/20 11/28/21 Yes [provider]  insulin glargine (LANTUS) 100 UNIT/ML Solostar Pen Inject 34 Units into the skin daily. (may take up to 50u based upon blood glucose reading)   Yes [provider]  Insulin  Pen Needle 32G X 4 MM MISC USE 1 NEEDLE SUBCUTANEOUSLY ONCE A DAY AS DIRECTED 03/24/18  Yes [provider]  ketoconazole (NIZORAL) 2 % shampoo Apply 1 application topically daily. (apply to scalp and wait at least 30 minutes before rinsing) 01/25/18  Yes [provider]  losartan (COZAAR) 25 MG tablet Take 25 mg  by mouth daily. 07/31/18  Yes [provider]  Magnesium 100 MG CAPS Take 4 capsules by mouth daily.   Yes [provider]  metFORMIN (GLUCOPHAGE-XR) 500 MG 24 hr tablet Take 500 mg by mouth. 09/18/19  Yes [provider]  niacin (NIASPAN) 500 MG CR tablet Take 500 mg by mouth every evening. 12/25/17  Yes [provider]  Omega-3 Fatty Acids (FISH OIL) 1000 MG CAPS Take 1 capsule by mouth.   Yes [provider]  simvastatin (ZOCOR) 20 MG tablet Take 20 mg by mouth at bedtime. 12/23/17  Yes [provider]  sitaGLIPtin (JANUVIA) 50 MG tablet Take 50 mg by mouth daily.   Yes [provider]  TRAVATAN Z 0.004 % SOLN ophthalmic solution Place 1 drop into both eyes at bedtime. 12/05/17  Yes [provider]  vitamin B-12 (CYANOCOBALAMIN) 1000 MCG tablet Take 1,000 mcg by mouth daily.   Yes [provider]  feeding supplement, ENSURE ENLIVE, (ENSURE ENLIVE) LIQD Take 237 mLs by mouth 2 (two) times daily between meals. 02/26/18   Fritzi Mandes, MD  vancomycin [facility]) 125 MG capsule  10/30/20   [provider]    Allergies as of 03/31/2021   (No Known Allergies)    Family History  Problem Relation Age of Onset   Kidney cancer Son    Bladder Cancer Neg Hx     Social History   Socioeconomic History   Marital status: Married    Spouse name: Not on file   Number of children: Not on file   Years of education: Not on file   Highest education level: Not on file  Occupational History   Not on file  Tobacco Use   Smoking status: Former    Types: Cigarettes    Quit date: 07/10/2011    Years since quitting: 9.7   Smokeless tobacco: Never  Vaping Use   Vaping Use: Never used  Substance and Sexual Activity   Alcohol use: No   Drug use: No   Sexual activity: Not Currently  Other Topics Concern   Not on file  Social History Narrative   Not on file   Social Determinants of Health   Financial Resource Strain:  Not on file  Food Insecurity: Not on file  Transportation Needs: Not on file  Physical Activity: Not on file  Stress: Not on file  Social Connections: Not on file  Intimate Partner Violence: Not on file    Review of Systems: See HPI, otherwise negative ROS  Physical Exam: BP (!) 141/61   Pulse 88   Temp (!) 97.5 F (36.4 C) (Temporal)   Resp 18   Ht 5' 4"  (1.626 m)   Wt 78 kg   SpO2 99%   BMI 29.52 kg/m  General:   Alert,  pleasant and cooperative in NAD Head:  Normocephalic and atraumatic. Neck:  Supple; no masses or thyromegaly. Lungs:  Clear throughout to auscultation.    Heart:  Regular rate and rhythm. Abdomen:  Soft, nontender and nondistended. Normal bowel sounds, without guarding, and without rebound.   Neurologic:  Alert and  oriented x4;  grossly normal  neurologically.  Impression/Plan: SYLVER VANTASSELL is here for an push enteroscopy to be performed for acute on chronic anemia  Risks, benefits, limitations, and alternatives regarding  push enteroscopy have been reviewed with the patient.  Questions have been answered.  All parties agreeable.   Sherri Sear, MD  04/12/2021, 11:06 AM

## 2021-04-12 NOTE — Op Note (Signed)
[facility] Gastroenterology Patient Name: Emily Faulkner Procedure Date: 04/12/2021 11:06 AM MRN: [id number] Account #: [id number] Date of Birth: [birth year] Admit Type: Outpatient Age: 80 Room: [facility] ENDO ROOM 4 Gender: Female Note Status: Finalized Instrument Name: Peds Colonoscope [id number] Procedure:             Small bowel enteroscopy Indications:           Iron deficiency anemia secondary to chronic blood                         loss, Gastrointestinal occult blood loss, Recurrent                         gastrointestinal bleeding, Arteriovenous malformation                         in the small intestine Providers:             Lin Landsman MD, MD Referring MD:          Sofie Hartigan (Referring MD) Medicines:             General Anesthesia Complications:         No immediate complications. Estimated blood loss: None. Procedure:             Pre-Anesthesia Assessment:                        - Prior to the procedure, a History and Physical was                         performed, and patient medications and allergies were                         reviewed. The patient is competent. The risks and                         benefits of the procedure and the sedation options and                         risks were discussed with the patient. All questions                         were answered and informed consent was obtained.                         Patient identification and proposed procedure were                         verified by the physician, the nurse, the                         anesthesiologist, the anesthetist and the technician                         in the pre-procedure area in the procedure room in the                         endoscopy suite. Mental Status Examination: alert and  oriented. Airway Examination: normal oropharyngeal                         airway and neck mobility. Respiratory Examination:                          clear to auscultation. CV Examination: normal.                         Prophylactic Antibiotics: The patient does not require                         prophylactic antibiotics. Prior Anticoagulants: The                         patient has taken no previous anticoagulant or                         antiplatelet agents. ASA Grade Assessment: III - A                         patient with severe systemic disease. After reviewing                         the risks and benefits, the patient was deemed in                         satisfactory condition to undergo the procedure. The                         anesthesia plan was to use general anesthesia.                         Immediately prior to administration of medications,                         the patient was re-assessed for adequacy to receive                         sedatives. The heart rate, respiratory rate, oxygen                         saturations, blood pressure, adequacy of pulmonary                         ventilation, and response to care were monitored                         throughout the procedure. The physical status of the                         patient was re-assessed after the procedure.                        After obtaining informed consent, the endoscope was                         passed under direct vision. Throughout the procedure,  the patient's blood pressure, pulse, and oxygen                         saturations were monitored continuously. The                         Colonoscope was introduced through the mouth and                         advanced to the mid-jejunum. The small bowel                         enteroscopy was accomplished without difficulty. The                         patient tolerated the procedure well. Findings:      Multiple angioectasias with no bleeding were found in the proximal       jejunum and in the mid-jejunum. Coagulation for hemostasis using argon       plasma at  0.8 liters/minute and 60 watts was successful. Estimated blood       loss: none.      There was no evidence of significant pathology in the duodenal bulb, in       the second portion of the duodenum, in the third portion of the duodenum       and in the fourth portion of the duodenum.      Multiple dispersed small erosions with stigmata of recent bleeding were       found in the gastric body and in the gastric antrum. Biopsies were taken       with a cold forceps for Helicobacter pylori testing.      The gastroesophageal junction and examined esophagus were normal. Impression:            - Multiple non-bleeding angioectasias in the jejunum.                         Treated with argon plasma coagulation (APC).                        - Normal duodenal bulb, second portion of the                         duodenum, third portion of the duodenum and fourth                         portion of the duodenum.                        - Erosive gastropathy with stigmata of recent                         bleeding. Biopsied.                        - Normal gastroesophageal junction and esophagus. Recommendation:        - Discharge patient to home (with escort).                        - Resume previous diet today.                        -  Continue iron therapy as needed with Hematology                        - Await pathology results. Procedure Code(s):     --- Professional ---                        [id number], 44, Small intestinal endoscopy, enteroscopy                         beyond second portion of duodenum, not including                         ileum; with control of bleeding (eg, injection,                         bipolar cautery, unipolar cautery, laser, heater                         probe, stapler, plasma coagulator)                        44361, Small intestinal endoscopy, enteroscopy beyond                         second portion of duodenum, not including ileum; with                         biopsy,  single or multiple Diagnosis Code(s):     --- Professional ---                        K55.20, Angiodysplasia of colon without hemorrhage                        K92.2, Gastrointestinal hemorrhage, unspecified                        D50.0, Iron deficiency anemia secondary to blood loss                         (chronic)                        R19.5, Other fecal abnormalities                        K31.819, Angiodysplasia of stomach and duodenum                         without bleeding CPT copyright 2019 American Medical Association. All rights reserved. The codes documented in this report are preliminary and upon coder review may  be revised to meet current compliance requirements. Dr. Ulyess Mort Lin Landsman MD, MD 04/12/2021 11:51:55 AM This report has been signed electronically. Number of Addenda: 0 Note Initiated On: 04/12/2021 11:06 AM Estimated Blood Loss:  Estimated blood loss: none.      [facility]

## 2021-04-12 NOTE — Anesthesia Postprocedure Evaluation (Signed)
Anesthesia Post Note  Patient: Emily Faulkner  Procedure(s) Performed: ENTEROSCOPY  Patient location during evaluation: Endoscopy Anesthesia Type: General Level of consciousness: awake and alert and oriented Pain management: pain level controlled Vital Signs Assessment: post-procedure vital signs reviewed and stable Respiratory status: spontaneous breathing, nonlabored ventilation and respiratory function stable Cardiovascular status: blood pressure returned to baseline and stable Postop Assessment: no signs of nausea or vomiting Anesthetic complications: no   No notable events documented.   Last Vitals:  Vitals:   04/12/21 1214 04/12/21 1224  BP: 136/78 (!) 130/100  Pulse:  89  Resp:  20  Temp:    SpO2:  96%    Last Pain:  Vitals:   04/12/21 1222  TempSrc:   PainSc: 0-No pain                 Chrisie Jankovich

## 2021-04-12 NOTE — Anesthesia Preprocedure Evaluation (Signed)
Anesthesia Evaluation  Patient identified by MRN, date of birth, ID band Patient awake    Reviewed: Allergy & Precautions, NPO status , Patient's Chart, lab work & pertinent test results  History of Anesthesia Complications Negative for: history of anesthetic complications  Airway Mallampati: II  TM Distance: >3 FB Neck ROM: Full    Dental  (+) Upper Dentures   Pulmonary neg sleep apnea, neg COPD, former smoker,    breath sounds clear to auscultation- rhonchi (-) wheezing      Cardiovascular hypertension, (-) CAD, (-) Past MI, (-) Cardiac Stents and (-) CABG + dysrhythmias + pacemaker  Rhythm:Regular Rate:Normal - Systolic murmurs and - Diastolic murmurs    Neuro/Psych neg Seizures negative neurological ROS  negative psych ROS   GI/Hepatic negative GI ROS, Neg liver ROS,   Endo/Other  diabetes, Insulin Dependent  Renal/GU Renal InsufficiencyRenal disease     Musculoskeletal  (+) Arthritis ,   Abdominal (+) - obese,   Peds  Hematology  (+) anemia ,   Anesthesia Other Findings Past Medical History: 11/16/2020: Acute kidney failure ([place]) 03/17/2018: AKI (acute kidney injury) ([place]) No date: Anemia No date: Cancer [facility])     Comment:  breast cancer right mastectomy No date: Diabetes mellitus without complication ([place]) 22/08/5425: GIB (gastrointestinal bleeding) No date: Glaucoma 10/24/2020: Hyperkalemia No date: Hyperlipidemia No date: Hypertension No date: Neuropathy 10/24/2020: Odynophagia No date: Osteoarthritis No date: Rheumatoid arthritis ([place]) 10/24/2020: Syncope and collapse No date: Telangiectasias   Reproductive/Obstetrics                             Anesthesia Physical Anesthesia Plan  ASA: 3  Anesthesia Plan: General   Post-op Pain Management:    Induction: Intravenous  PONV Risk Score and Plan: 2 and Propofol infusion  Airway Management Planned: Natural  Airway  Additional Equipment:   Intra-op Plan:   Post-operative Plan:   Informed Consent: I have reviewed the patients History and Physical, chart, labs and discussed the procedure including the risks, benefits and alternatives for the proposed anesthesia with the patient or authorized representative who has indicated his/her understanding and acceptance.     Dental advisory given  Plan Discussed with: CRNA and Anesthesiologist  Anesthesia Plan Comments:         Anesthesia Quick Evaluation

## 2021-04-14 ENCOUNTER — Encounter: Payer: Self-pay | Admitting: Gastroenterology

## 2021-04-14 LAB — SURGICAL PATHOLOGY

## 2021-04-17 ENCOUNTER — Telehealth: Payer: Self-pay

## 2021-04-17 NOTE — Telephone Encounter (Signed)
-----   Message from Lin Landsman, MD sent at 04/17/2021 10:00 AM EDT ----- Please inform patient that the gastric biopsy results came back suggesting atrophic gastritis which could also contribute to iron deficiency anemia in addition to blood loss from her AVMs.  There is not much to be done for atrophic gastritis.  Monitor her CBC and iron periodically  Rohini Vanga

## 2021-04-17 NOTE — Telephone Encounter (Signed)
Called and left a message for call back. Sent mychart message

## 2021-04-21 ENCOUNTER — Other Ambulatory Visit: Payer: Self-pay | Admitting: Oncology

## 2021-04-26 ENCOUNTER — Inpatient Hospital Stay: Payer: Medicare HMO

## 2021-04-26 ENCOUNTER — Encounter: Payer: Self-pay | Admitting: Oncology

## 2021-04-26 ENCOUNTER — Inpatient Hospital Stay: Payer: Medicare HMO | Attending: Oncology

## 2021-04-26 ENCOUNTER — Inpatient Hospital Stay (HOSPITAL_BASED_OUTPATIENT_CLINIC_OR_DEPARTMENT_OTHER): Payer: Medicare HMO | Admitting: Oncology

## 2021-04-26 ENCOUNTER — Other Ambulatory Visit: Payer: Self-pay

## 2021-04-26 VITALS — BP 143/59 | HR 90 | Temp 98.3°F | Resp 16 | Wt 169.9 lb

## 2021-04-26 DIAGNOSIS — D508 Other iron deficiency anemias: Secondary | ICD-10-CM

## 2021-04-26 LAB — CBC WITH DIFFERENTIAL/PLATELET
Abs Immature Granulocytes: 0.02 10*3/uL (ref 0.00–0.07)
Basophils Absolute: 0 10*3/uL (ref 0.0–0.1)
Basophils Relative: 1 %
Eosinophils Absolute: 0.2 10*3/uL (ref 0.0–0.5)
Eosinophils Relative: 4 %
HCT: 34.3 % — ABNORMAL LOW (ref 36.0–46.0)
Hemoglobin: 10.8 g/dL — ABNORMAL LOW (ref 12.0–15.0)
Immature Granulocytes: 0 %
Lymphocytes Relative: 11 %
Lymphs Abs: 0.6 10*3/uL — ABNORMAL LOW (ref 0.7–4.0)
MCH: 30.1 pg (ref 26.0–34.0)
MCHC: 31.5 g/dL (ref 30.0–36.0)
MCV: 95.5 fL (ref 80.0–100.0)
Monocytes Absolute: 0.4 10*3/uL (ref 0.1–1.0)
Monocytes Relative: 7 %
Neutro Abs: 4.2 10*3/uL (ref 1.7–7.7)
Neutrophils Relative %: 77 %
Platelets: 139 10*3/uL — ABNORMAL LOW (ref 150–400)
RBC: 3.59 MIL/uL — ABNORMAL LOW (ref 3.87–5.11)
RDW: 19.4 % — ABNORMAL HIGH (ref 11.5–15.5)
WBC: 5.5 10*3/uL (ref 4.0–10.5)
nRBC: 0 % (ref 0.0–0.2)

## 2021-04-26 LAB — COMPREHENSIVE METABOLIC PANEL
ALT: 14 U/L (ref 0–44)
AST: 28 U/L (ref 15–41)
Albumin: 3.6 g/dL (ref 3.5–5.0)
Alkaline Phosphatase: 63 U/L (ref 38–126)
Anion gap: 8 (ref 5–15)
BUN: 14 mg/dL (ref 8–23)
CO2: 24 mmol/L (ref 22–32)
Calcium: 9.1 mg/dL (ref 8.9–10.3)
Chloride: 103 mmol/L (ref 98–111)
Creatinine, Ser: 1.12 mg/dL — ABNORMAL HIGH (ref 0.44–1.00)
GFR, Estimated: 50 mL/min — ABNORMAL LOW (ref >60)
Glucose, Bld: 205 mg/dL — ABNORMAL HIGH (ref 70–99)
Potassium: 3.7 mmol/L (ref 3.5–5.1)
Sodium: 135 mmol/L (ref 135–145)
Total Bilirubin: 1 mg/dL (ref 0.3–1.2)
Total Protein: 7.1 g/dL (ref 6.5–8.1)

## 2021-04-26 LAB — IRON AND TIBC
Iron: 55 ug/dL (ref 28–170)
Saturation Ratios: 17 % (ref 10.4–31.8)
TIBC: 330 ug/dL (ref 250–450)
UIBC: 275 ug/dL

## 2021-04-26 LAB — FERRITIN: Ferritin: 87 ng/mL (ref 11–307)

## 2021-04-26 NOTE — Progress Notes (Signed)
Pt states she has noticed more swelling in her feet along with more tingling. Per son, pt has been more tired and low energy.

## 2021-04-26 NOTE — Progress Notes (Signed)
Hematology/Oncology Consult note [facility]  Telephone:([phone] [phone]  Patient Care Team: Sofie Hartigan, MD as PCP - General (Family Medicine)   Name of the patient: Emily Faulkner  [id number]  02-22-1941   Date of visit: 04/26/21  Diagnosis-iron deficiency anemia possibly secondary to bleeding AVM  Chief complaint/ Reason for visit-routine follow-up of iron deficiency anemia  Heme/Onc history: Patient is a 80 year old female with a past medical history significant for stage III CKD, hypertension diabetes and left renal mass who was found to have a hemoglobin of 8.7 at nephrology office and hence referred the patient for anemia.  For a renal mass she has been followed by Dr. Erlene Quan in the masses thought to be slow-growing and conservative management was recommended.  Patient presently reports ongoing fatigue.  Denies any blood loss in her stool or urine.  Denies any dark melanotic stools.  Patient has had a complete GI work-up including endoscopy colonoscopy and small bowel endoscopy in 2019 by Dr. Vicente Males colonoscopy showed multiple bleeding colonic angioectasias treated with APC small bowel endoscopy showed nonbleeding angioectasias in the jejunum treated with APC  Patient found to have significant iron deficiency anemia with a hemoglobin of 6.9 requiring IV iron.  Also seen by Dr. Marius Ditch and underwent push enteroscopy which did not show any evidence of active bleeding but showed chronic atrophic gastritis  Interval history-patient reports ongoing fatigue which is a little better after receiving IV iron but still not back to her baseline.  Denies any blood loss in her stool or urine  ECOG PS- 2 Pain scale- 0  Review of systems- Review of Systems  Constitutional:  Positive for malaise/fatigue. Negative for chills, fever and weight loss.  HENT:  Negative for congestion, ear discharge and nosebleeds.   Eyes:  Negative for blurred vision.   Respiratory:  Negative for cough, hemoptysis, sputum production, shortness of breath and wheezing.   Cardiovascular:  Negative for chest pain, palpitations, orthopnea and claudication.  Gastrointestinal:  Negative for abdominal pain, blood in stool, constipation, diarrhea, heartburn, melena, nausea and vomiting.  Genitourinary:  Negative for dysuria, flank pain, frequency, hematuria and urgency.  Musculoskeletal:  Negative for back pain, joint pain and myalgias.  Skin:  Negative for rash.  Neurological:  Negative for dizziness, tingling, focal weakness, seizures, weakness and headaches.  Endo/Heme/Allergies:  Does not bruise/bleed easily.  Psychiatric/Behavioral:  Negative for depression and suicidal ideas. The patient does not have insomnia.      No Known Allergies   Past Medical History:  Diagnosis Date   Acute kidney failure ([place]) 11/16/2020   AKI (acute kidney injury) ([place]) 03/17/2018   Anemia    Cancer (H[facility])    breast cancer right mastectomy   Diabetes mellitus without complication ([place])    GIB (gastrointestinal bleeding) 02/20/2018   Glaucoma    Hyperkalemia 10/24/2020   Hyperlipidemia    Hypertension    Neuropathy    Odynophagia 10/24/2020   Osteoarthritis    Rheumatoid arthritis ([place])    Syncope and collapse 10/24/2020   Telangiectasias      Past Surgical History:  Procedure Laterality Date   BREAST SURGERY     right mastectomy   CHOLECYSTECTOMY     COLONOSCOPY     COLONOSCOPY WITH PROPOFOL N/A 08/14/2016   Procedure: COLONOSCOPY WITH PROPOFOL;  Surgeon: Lollie Sails, MD;  Location: [facility] ENDOSCOPY;  Service: Endoscopy;  Laterality: N/A;   COLONOSCOPY WITH PROPOFOL N/A 02/22/2018   Procedure: COLONOSCOPY WITH PROPOFOL;  Surgeon: Jonathon Bellows, MD;  Location: [facility] ENDOSCOPY;  Service: Gastroenterology;  Laterality: N/A;   ENTEROSCOPY N/A 04/12/2021   Procedure: ENTEROSCOPY;  Surgeon: Lin Landsman, MD;  Location: [facility] ENDOSCOPY;  Service: Gastroenterology;   Laterality: N/A;  push   ESOPHAGOGASTRODUODENOSCOPY (EGD) WITH PROPOFOL N/A 08/14/2016   Procedure: ESOPHAGOGASTRODUODENOSCOPY (EGD) WITH PROPOFOL;  Surgeon: Lollie Sails, MD;  Location: [facility] ENDOSCOPY;  Service: Endoscopy;  Laterality: N/A;   ESOPHAGOGASTRODUODENOSCOPY (EGD) WITH PROPOFOL N/A 01/30/2018   Procedure: ESOPHAGOGASTRODUODENOSCOPY (EGD) WITH PROPOFOL;  Surgeon: Toledo, Benay Pike, MD;  Location: [facility] ENDOSCOPY;  Service: Gastroenterology;  Laterality: N/A;   ESOPHAGOGASTRODUODENOSCOPY (EGD) WITH PROPOFOL N/A 02/22/2018   Procedure: ESOPHAGOGASTRODUODENOSCOPY (EGD) WITH PROPOFOL;  Surgeon: Jonathon Bellows, MD;  Location: [facility] ENDOSCOPY;  Service: Gastroenterology;  Laterality: N/A;   EXCISIONAL HEMORRHOIDECTOMY     KYPHOPLASTY N/A 03/20/2018   Procedure: [id number];  Surgeon: Hessie Knows, MD;  Location: [facility] ORS;  Service: Orthopedics;  Laterality: N/A;   PACEMAKER IMPLANT N/A 10/26/2020   Procedure: PACEMAKER IMPLANT;  Surgeon: Isaias Cowman, MD;  Location: [facility] CV LAB;  Service: Cardiovascular;  Laterality: N/A;    Social History   Socioeconomic History   Marital status: Married    Spouse name: Not on file   Number of children: Not on file   Years of education: Not on file   Highest education level: Not on file  Occupational History   Not on file  Tobacco Use   Smoking status: Former    Types: Cigarettes    Quit date: 07/10/2011    Years since quitting: 9.8   Smokeless tobacco: Never  Vaping Use   Vaping Use: Never used  Substance and Sexual Activity   Alcohol use: No   Drug use: No   Sexual activity: Not Currently  Other Topics Concern   Not on file  Social History Narrative   Not on file   Social Determinants of Health   Financial Resource Strain: Not on file  Food Insecurity: Not on file  Transportation Needs: Not on file  Physical Activity: Not on file  Stress: Not on file  Social Connections: Not on file  Intimate Partner Violence:  Not on file    Family History  Problem Relation Age of Onset   Kidney cancer Son    Bladder Cancer Neg Hx      Current Outpatient Medications:    ACCU-CHEK AVIVA PLUS test strip, , Disp: , Rfl:    ACCU-CHEK SOFTCLIX LANCETS lancets, once daily Use as instructed., Disp: , Rfl:    acetaminophen (TYLENOL) 500 MG tablet, Take 500 mg by mouth every 8 (eight) hours as needed., Disp: , Rfl:    alendronate (FOSAMAX) 70 MG tablet, TAKE 1 TABLET BY MOUTH EVERY 7DAYS WITH A FULL GLASS OF WATER. DO NOT LIE DOWN FOR 30 MINUTES, Disp: , Rfl:    Biotin 1000 MCG tablet, Take 1,000 mcg by mouth daily., Disp: , Rfl:    Cholecalciferol 50 MCG (2000 UT) CAPS, Take 2,000 Units by mouth daily., Disp: , Rfl:    feeding supplement, ENSURE ENLIVE, (ENSURE ENLIVE) LIQD, Take 237 mLs by mouth 2 (two) times daily between meals., Disp: 237 mL, Rfl: 12   glipiZIDE (GLUCOTROL) 5 MG tablet, 5 mg daily before breakfast., Disp: , Rfl:    insulin glargine (LANTUS) 100 UNIT/ML Solostar Pen, Inject 34 Units into the skin daily. (may take up to 50u based upon blood glucose reading), Disp: , Rfl:    Insulin Pen Needle 32G X  4 MM MISC, USE 1 NEEDLE SUBCUTANEOUSLY ONCE A DAY AS DIRECTED, Disp: , Rfl:    ketoconazole (NIZORAL) 2 % shampoo, Apply 1 application topically daily. (apply to scalp and wait at least 30 minutes before rinsing), Disp: , Rfl:    losartan (COZAAR) 25 MG tablet, Take 25 mg by mouth daily., Disp: , Rfl:    Magnesium 100 MG CAPS, Take 4 capsules by mouth daily., Disp: , Rfl:    metFORMIN (GLUCOPHAGE-XR) 500 MG 24 hr tablet, Take 500 mg by mouth., Disp: , Rfl:    niacin (NIASPAN) 500 MG CR tablet, Take 500 mg by mouth every evening., Disp: , Rfl: 2   omeprazole (PRILOSEC) 40 MG capsule, Take 1 capsule (40 mg total) by mouth daily before breakfast., Disp: 30 capsule, Rfl: 3   sitaGLIPtin (JANUVIA) 50 MG tablet, Take 50 mg by mouth daily., Disp: , Rfl:    TRAVATAN Z 0.004 % SOLN ophthalmic solution, Place 1 drop  into both eyes at bedtime., Disp: , Rfl:    vancomycin (VANCOCIN) 125 MG capsule, , Disp: , Rfl:    vitamin B-12 (CYANOCOBALAMIN) 1000 MCG tablet, Take 1,000 mcg by mouth daily., Disp: , Rfl:    doxepin (SINEQUAN) 25 MG capsule, Take 25 mg by mouth daily. (Patient not taking: Reported on 04/26/2021), Disp: , Rfl:    Omega-3 Fatty Acids (FISH OIL) 1000 MG CAPS, Take 1 capsule by mouth. (Patient not taking: Reported on 04/26/2021), Disp: , Rfl:    simvastatin (ZOCOR) 20 MG tablet, Take 20 mg by mouth at bedtime., Disp: , Rfl: 6  Physical exam:  Vitals:   04/26/21 1037  BP: (!) 143/59  Pulse: 90  Resp: 16  Temp: 98.3 F (36.8 C)  SpO2: 97%  Weight: 169 lb 14.4 oz (77.1 kg)   Physical Exam Constitutional:      Comments: Sitting in a wheelchair.  Appears in no acute distress  HENT:     Head: Atraumatic.  Cardiovascular:     Rate and Rhythm: Normal rate and regular rhythm.     Heart sounds: Normal heart sounds.  Pulmonary:     Effort: Pulmonary effort is normal.     Breath sounds: Normal breath sounds.  Abdominal:     General: Bowel sounds are normal.     Palpations: Abdomen is soft.  Skin:    General: Skin is warm and dry.  Neurological:     Mental Status: She is alert and oriented to person, place, and time.     CMP Latest Ref Rng & Units 04/26/2021  Glucose 70 - 99 mg/dL 205(H)  BUN 8 - 23 mg/dL 14  Creatinine 0.44 - 1.00 mg/dL 1.12(H)  Sodium 135 - 145 mmol/L 135  Potassium 3.5 - 5.1 mmol/L 3.7  Chloride 98 - 111 mmol/L 103  CO2 22 - 32 mmol/L 24  Calcium 8.9 - 10.3 mg/dL 9.1  Total Protein 6.5 - 8.1 g/dL 7.1  Total Bilirubin 0.3 - 1.2 mg/dL 1.0  Alkaline Phos 38 - 126 U/L 63  AST 15 - 41 U/L 28  ALT 0 - 44 U/L 14   CBC Latest Ref Rng & Units 04/26/2021  WBC 4.0 - 10.5 K/uL 5.5  Hemoglobin 12.0 - 15.0 g/dL 10.8(L)  Hematocrit 36.0 - 46.0 % 34.3(L)  Platelets 150 - 400 K/uL 139(L)      Assessment and plan- Patient is a 80 y.o. female with history of iron  deficiency anemia possibly secondary to bleeding AVMs here for routine follow-up  Patient's hemoglobin is improved to 10.8 from 6.92 months ago after receiving IV iron.  Her iron studies are presently normal and she does not require any IV iron at this time.  Also serum creatinine fluctuates and she may also have a component of CKD contributing to her anemia.  For now I am inclined to monitor her anemia conservatively with a repeat CBC ferritin and iron studies to be checked at 2 4 and 6 months and I will see her back in 6 months.  Labs will be done at [facility]   Visit Diagnosis 1. Iron deficiency anemia secondary to inadequate dietary iron intake      Dr. Randa Evens, MD, MPH [facility] [phone] 04/26/2021 12:10 PM

## 2021-06-05 DIAGNOSIS — E1169 Type 2 diabetes mellitus with other specified complication: Secondary | ICD-10-CM | POA: Diagnosis not present

## 2021-06-05 DIAGNOSIS — Z794 Long term (current) use of insulin: Secondary | ICD-10-CM | POA: Diagnosis not present

## 2021-06-05 DIAGNOSIS — E785 Hyperlipidemia, unspecified: Secondary | ICD-10-CM | POA: Diagnosis not present

## 2021-06-05 DIAGNOSIS — M810 Age-related osteoporosis without current pathological fracture: Secondary | ICD-10-CM | POA: Diagnosis not present

## 2021-06-05 DIAGNOSIS — E1142 Type 2 diabetes mellitus with diabetic polyneuropathy: Secondary | ICD-10-CM | POA: Diagnosis not present

## 2021-06-09 ENCOUNTER — Ambulatory Visit (INDEPENDENT_AMBULATORY_CARE_PROVIDER_SITE_OTHER): Payer: Medicare HMO | Admitting: Gastroenterology

## 2021-06-09 ENCOUNTER — Other Ambulatory Visit: Payer: Self-pay

## 2021-06-09 ENCOUNTER — Encounter: Payer: Self-pay | Admitting: Gastroenterology

## 2021-06-09 VITALS — BP 155/66 | HR 99 | Temp 97.7°F | Ht 64.0 in | Wt 176.8 lb

## 2021-06-09 DIAGNOSIS — K5520 Angiodysplasia of colon without hemorrhage: Secondary | ICD-10-CM

## 2021-06-09 DIAGNOSIS — D500 Iron deficiency anemia secondary to blood loss (chronic): Secondary | ICD-10-CM | POA: Diagnosis not present

## 2021-06-09 DIAGNOSIS — K7460 Unspecified cirrhosis of liver: Secondary | ICD-10-CM | POA: Diagnosis not present

## 2021-06-09 NOTE — Progress Notes (Signed)
[name], MD [address]  [place]  [place], [place] [address]  Main: [phone]  Fax: [phone]    Gastroenterology Consultation  Referring Provider:     Sofie Hartigan, MD Primary Care Physician:  Sofie Hartigan, MD Primary Gastroenterologist:  Dr. Gustavo Lah Reason for Consultation:     Chronic iron deficiency anemia, AVMs        HPI:   Emily Faulkner is a 79 y.o. female referred by Dr. Ellison Hughs [name], MD  for consultation & management of chronic iron deficiency anemia.  Patient is functionally independent, chronic iron deficiency anemia, on oral iron therapy, diabetes, hypertension, hyperlipidemia, cirrhosis of liver, well compensated who was admitted to [facility] on 02/21/2018 secondary to severe symptomatic anemia, hemoglobin 6.3 and melena.  She was also taking oral iron.  During this admission, patient underwent push enteroscopy as well as colonoscopy which revealed small bowel AVMs as well as several AVMs in the ascending colon and cecum which were all treated with APC. She was also admitted in last week of July 2019 secondary to severe symptomatic anemia, underwent EGD which was unremarkable except for mild portal hypertensive gastropathy. Patient previously underwent EGD and colonoscopy in 08/2016, revealed nonbleeding AVMs in the ascending colon, left untreated. She never had a video capsule endoscopy performed.  Follow-up visit 12/01/2020 Patient is here for follow-up of C. difficile infection.  She was recently hospitalized secondary to diarrhea, dehydration that has resulted in AKI.  She also had a pacemaker placement during that hospitalization because she presented with syncope secondary to third-degree AV block.  Patient was discharged home on 10 days course of oral vancomycin which she has completed.  She reports that her diarrhea is significantly improved from several episodes of watery bowel movements to 3-4 episodes of soft pudding like brown bowel  movements.  She denies any abdominal pain, reports good appetite, denies fever, chills, nausea or vomiting.  Patient deferred video capsule endoscopy in the past for work-up of iron deficiency anemia  Follow-up visit 03/31/2021 Patient is requested an urgent appointment by Dr. Janese Banks, hematologist due to worsening anemia, her hemoglobin dropped to 6.9 on 03/01/2021, ferritin 8.  She received 5 doses of Venofer.  Hemoglobin improved to 8.3 which is her new baseline.  Patient is experiencing ongoing severe fatigue.  Her other concerns are postprandial upper abdominal discomfort, bloating, urgency as well as loose stools.  When she was taking fish oil, it would lead to severe diarrhea.  Patient is accompanied by her son today.  Follow-up visit 06/09/2021 Patient is here for follow-up of iron deficiency anemia and history of diarrhea.  She reports that her diarrhea has resolved.  She is pleased that she is doing very well from GI standpoint.  She underwent push enteroscopy, several nonbleeding small bowel AVMs were cauterized.  She is currently having brown bowel movements, 1-2 daily.  She recently had follow-up with Dr. Janese Banks, heme-onc and found to have improving hemoglobin along with iron studies.  Parenteral iron has been held.  NSAIDs: None   Antiplts/Anticoagulants/Anti thrombotics: Aspirin 81  GI Procedures:  04/12/2021 small bowel endoscopy - Multiple non-bleeding angioectasias in the jejunum. Treated with argon plasma coagulation (APC). - Normal duodenal bulb, second portion of the duodenum, third portion of the duodenum and fourth portion of the duodenum. - Erosive gastropathy with stigmata of recent bleeding. Biopsied. DIAGNOSIS:  A. STOMACH; COLD BIOPSY:  - CHRONIC ATROPHIC GASTRITIS WITH FOCAL ACTIVE INFLAMMATION.  - NEGATIVE FOR HELICOBACTER  PYLORI BY IMMUNOHISTOCHEMISTRY.  - NEGATIVE FOR INTESTINAL METAPLASIA, DYSPLASIA, AND MALIGNANCY.   Small bowel endoscopy 02/22/2018 - Three non-bleeding  angioectasias in the jejunum. Treated with argon plasma coagulation (APC). - Normal esophagus. - Normal stomach. - No specimens collected.  Colonoscopy 02/22/2018 - Preparation of the colon was fair. - Multiple bleeding colonic angioectasias. Treated with argon plasma coagulation (APC). - The examination was otherwise normal on direct and retroflexion views. - No specimens collected.  EGD 01/30/2018 by Dr. Alice Reichert - Widely patent and non-obstructing Schatzki ring. - Portal hypertensive gastropathy. - Normal examined duodenum. - The examination was otherwise normal. - No specimens collected.  EGD and colonoscopy 08/2016 by Dr. Gustavo Lah for iron deficiency anemia AVMs, nonbleeding in ascending colon were detected, not treated DIAGNOSIS:  A. STOMACH, ANTRUM AND BODY; COLD BIOPSY:  - ANTRAL AND OXYNTIC MUCOSA WITH MODERATE CHRONIC ACTIVE GASTRITIS,  HELICOBACTER PYLORI ASSOCIATED.  - NEGATIVE FOR DYSPLASIA AND MALIGNANCY.   B.  GE JUNCTION; COLD BIOPSY:  - REFLUX GASTROESOPHAGITIS.  - NEGATIVE FOR GOBLET CELLS, DYSPLASIA AND MALIGNANCY.   C. COLON, RANDOM RIGHT; COLD BIOPSY:  - NO PATHOLOGIC CHANGE.   D. COLON, RANDOM LEFT; COLD BIOPSY:  - NO PATHOLOGIC CHANGE.   E.  RECTUM POLYP; COLD BIOPSY:  - POLYPOID SQUAMOCOLUMNAR (ANORECTAL) MUCOSA.  - PROMINENT LYMPHOID AGGREGATE.  - NEGATIVE FOR DYSPLASIA AND MALIGNANCY.  - MULTIPLE DEEPER LEVELS WERE EXAMINED.   Past Medical History:  Diagnosis Date   Acute kidney failure ([place]) 11/16/2020   AKI (acute kidney injury) ([place]) 03/17/2018   Anemia    Cancer ([facility])    breast cancer right mastectomy   Diabetes mellitus without complication ([place])    GIB (gastrointestinal bleeding) 02/20/2018   Glaucoma    Hyperkalemia 10/24/2020   Hyperlipidemia    Hypertension    Neuropathy    Odynophagia 10/24/2020   Osteoarthritis    Rheumatoid arthritis ([place])    Syncope and collapse 10/24/2020   Telangiectasias     Past Surgical History:   Procedure Laterality Date   BREAST SURGERY     right mastectomy   CHOLECYSTECTOMY     COLONOSCOPY     COLONOSCOPY WITH PROPOFOL N/A 08/14/2016   Procedure: COLONOSCOPY WITH PROPOFOL;  Surgeon: Lollie Sails, MD;  Location: [facility] ENDOSCOPY;  Service: Endoscopy;  Laterality: N/A;   COLONOSCOPY WITH PROPOFOL N/A 02/22/2018   Procedure: COLONOSCOPY WITH PROPOFOL;  Surgeon: Jonathon Bellows, MD;  Location: [facility] ENDOSCOPY;  Service: Gastroenterology;  Laterality: N/A;   ENTEROSCOPY N/A 04/12/2021   Procedure: ENTEROSCOPY;  Surgeon: Lin Landsman, MD;  Location: [facility] ENDOSCOPY;  Service: Gastroenterology;  Laterality: N/A;  push   ESOPHAGOGASTRODUODENOSCOPY (EGD) WITH PROPOFOL N/A 08/14/2016   Procedure: ESOPHAGOGASTRODUODENOSCOPY (EGD) WITH PROPOFOL;  Surgeon: Lollie Sails, MD;  Location: [facility] ENDOSCOPY;  Service: Endoscopy;  Laterality: N/A;   ESOPHAGOGASTRODUODENOSCOPY (EGD) WITH PROPOFOL N/A 01/30/2018   Procedure: ESOPHAGOGASTRODUODENOSCOPY (EGD) WITH PROPOFOL;  Surgeon: Toledo, Benay Pike, MD;  Location: [facility] ENDOSCOPY;  Service: Gastroenterology;  Laterality: N/A;   ESOPHAGOGASTRODUODENOSCOPY (EGD) WITH PROPOFOL N/A 02/22/2018   Procedure: ESOPHAGOGASTRODUODENOSCOPY (EGD) WITH PROPOFOL;  Surgeon: Jonathon Bellows, MD;  Location: [facility] ENDOSCOPY;  Service: Gastroenterology;  Laterality: N/A;   EXCISIONAL HEMORRHOIDECTOMY     KYPHOPLASTY N/A 03/20/2018   Procedure: [id number];  Surgeon: Hessie Knows, MD;  Location: [facility] ORS;  Service: Orthopedics;  Laterality: N/A;   PACEMAKER IMPLANT N/A 10/26/2020   Procedure: PACEMAKER IMPLANT;  Surgeon: Isaias Cowman, MD;  Location: [facility] CV LAB;  Service: Cardiovascular;  Laterality: N/A;    Current Outpatient Medications:    ACCU-CHEK AVIVA PLUS test strip, , Disp: , Rfl:    ACCU-CHEK SOFTCLIX LANCETS lancets, once daily Use as instructed., Disp: , Rfl:    acetaminophen (TYLENOL) 500 MG tablet, Take 500 mg by mouth every 8 (eight) hours  as needed., Disp: , Rfl:    alendronate (FOSAMAX) 70 MG tablet, TAKE 1 TABLET BY MOUTH EVERY 7DAYS WITH A FULL GLASS OF WATER. DO NOT LIE DOWN FOR 30 MINUTES, Disp: , Rfl:    Biotin 1000 MCG tablet, Take 1,000 mcg by mouth daily., Disp: , Rfl:    Cholecalciferol 50 MCG (2000 UT) CAPS, Take 2,000 Units by mouth daily., Disp: , Rfl:    doxepin (SINEQUAN) 25 MG capsule, Take 25 mg by mouth daily., Disp: , Rfl:    feeding supplement, ENSURE ENLIVE, (ENSURE ENLIVE) LIQD, Take 237 mLs by mouth 2 (two) times daily between meals., Disp: 237 mL, Rfl: 12   glipiZIDE (GLUCOTROL) 5 MG tablet, 5 mg daily before breakfast., Disp: , Rfl:    insulin glargine (LANTUS) 100 UNIT/ML Solostar Pen, Inject 34 Units into the skin daily. (may take up to 50u based upon blood glucose reading), Disp: , Rfl:    Insulin Pen Needle 32G X 4 MM MISC, USE 1 NEEDLE SUBCUTANEOUSLY ONCE A DAY AS DIRECTED, Disp: , Rfl:    ketoconazole (NIZORAL) 2 % shampoo, Apply 1 application topically daily. (apply to scalp and wait at least 30 minutes before rinsing), Disp: , Rfl:    losartan (COZAAR) 25 MG tablet, Take 25 mg by mouth daily., Disp: , Rfl:    Magnesium 100 MG CAPS, Take 4 capsules by mouth daily., Disp: , Rfl:    metFORMIN (GLUCOPHAGE-XR) 500 MG 24 hr tablet, Take 500 mg by mouth., Disp: , Rfl:    niacin (NIASPAN) 500 MG CR tablet, Take 500 mg by mouth every evening., Disp: , Rfl: 2   Omega-3 Fatty Acids (FISH OIL) 1000 MG CAPS, Take 1 capsule by mouth., Disp: , Rfl:    simvastatin (ZOCOR) 20 MG tablet, Take 20 mg by mouth at bedtime., Disp: , Rfl: 6   sitaGLIPtin (JANUVIA) 50 MG tablet, Take 50 mg by mouth daily., Disp: , Rfl:    TRAVATAN Z 0.004 % SOLN ophthalmic solution, Place 1 drop into both eyes at bedtime., Disp: , Rfl:    vancomycin (VANCOCIN) 125 MG capsule, , Disp: , Rfl:    vitamin B-12 (CYANOCOBALAMIN) 1000 MCG tablet, Take 1,000 mcg by mouth daily., Disp: , Rfl:    omeprazole (PRILOSEC) 40 MG capsule, Take 1 capsule  (40 mg total) by mouth daily before breakfast., Disp: 30 capsule, Rfl: 3   Family History  Problem Relation Age of Onset   Kidney cancer Son    Bladder Cancer Neg Hx      Social History   Tobacco Use   Smoking status: Former    Types: Cigarettes    Quit date: 07/10/2011    Years since quitting: 9.9   Smokeless tobacco: Never  Vaping Use   Vaping Use: Never used  Substance Use Topics   Alcohol use: No   Drug use: No    Allergies as of 06/09/2021   (No Known Allergies)    Review of Systems:    All systems reviewed and negative except where noted in HPI.   Physical Exam:  BP (!) 155/66 (BP Location: Right Arm, Patient Position: Sitting, Cuff Size: Normal)   Pulse 99  Temp 97.7 F (36.5 C) (Temporal)   Ht 5' 4"  (1.626 m)   Wt 176 lb 12.8 oz (80.2 kg)   BMI 30.35 kg/m  No LMP recorded. Patient is postmenopausal.  General:   Alert,  Well-developed, well-nourished, pleasant and cooperative in NAD Head:  Normocephalic and atraumatic. Eyes:  Sclera clear, no icterus.   Conjunctiva pink. Ears:  Normal auditory acuity. Nose:  No deformity, discharge, or lesions. Mouth:  No deformity or lesions,oropharynx pink & moist. Neck:  Supple; no masses or thyromegaly. Lungs:  Respirations even and unlabored.  Clear throughout to auscultation.   No wheezes, crackles, or rhonchi. No acute distress. Heart:  Regular rate and rhythm; no murmurs, clicks, rubs, or gallops. Abdomen:  Normal bowel sounds. Soft, obese, nontender, nondistended, without masses, hepatosplenomegaly or hernias noted.  No guarding or rebound tenderness.   Rectal: Not performed Msk:  Symmetrical without gross deformities. Good, equal movement & strength bilaterally. Pulses:  Normal pulses noted. Extremities:  No clubbing or edema.  No cyanosis. Neurologic:  Alert and oriented x3;  grossly normal neurologically. Skin:  Intact without significant lesions or rashes. No jaundice. Psych:  Alert and cooperative. Normal  mood and affect.  Imaging Studies: Reviewed  Assessment and Plan:   Emily Faulkner is a 80 y.o. Caucasian female with metabolic syndrome, well compensated cirrhosis, probably NASH in etiology, chronic iron deficiency anemia secondary to chronic blood loss from small bowel and colonic AVMs.  She underwent small bowel endoscopy and colonoscopy, AVMs in jejunum, AC and cecum that were detected were cauterized History of C. difficile infection s/p 10 days course of oral vancomycin  Iron deficiency anemia secondary to chronic blood loss from small bowel and colonic AVMs Disease and acute on chronic anemia, likely secondary to recurrent bleeding from AVMs S/p IV iron therapy, improvement in hemoglobin -No evidence of B12 or folate deficiency, -S/p repeat push enteroscopy in 10/22, APC of nonbleeding small bowel AVMs  -Also, has atrophic gastritis, likely contributing to iron deficiency anemia.  No evidence of H. pylori infection  Compensated cirrhosis, probably NASH in etiology Portal hypertension: Manifested as mild portal hypertensive gastropathy and splenomegaly No evidence of varices based on EGD from 01/2018 Patient is euvolemic, no evidence of ascites HCC screening: Up-to-date on imaging, AFP negative, recommend right upper quadrant ultrasound PSE none  Abdominal bloating, postprandial urgency and diarrhea: Currently resolved  pancreatic fecal elastase levels normal   Follow up in 6 months   [name], MD

## 2021-06-19 ENCOUNTER — Other Ambulatory Visit: Payer: Self-pay | Admitting: Gastroenterology

## 2021-06-19 ENCOUNTER — Other Ambulatory Visit: Payer: Self-pay

## 2021-06-19 DIAGNOSIS — N1832 Chronic kidney disease, stage 3b: Secondary | ICD-10-CM | POA: Diagnosis not present

## 2021-06-19 DIAGNOSIS — E1122 Type 2 diabetes mellitus with diabetic chronic kidney disease: Secondary | ICD-10-CM | POA: Diagnosis not present

## 2021-06-19 DIAGNOSIS — I10 Essential (primary) hypertension: Secondary | ICD-10-CM | POA: Diagnosis not present

## 2021-06-19 DIAGNOSIS — D631 Anemia in chronic kidney disease: Secondary | ICD-10-CM | POA: Diagnosis not present

## 2021-06-19 MED ORDER — OMEPRAZOLE 40 MG PO CPDR: 40.0000 mg | DELAYED_RELEASE_CAPSULE | Freq: Every day | ORAL | 3 refills | Status: DC

## 2021-06-19 NOTE — Progress Notes (Signed)
Sent in refill as requested

## 2021-06-20 DIAGNOSIS — I2510 Atherosclerotic heart disease of native coronary artery without angina pectoris: Secondary | ICD-10-CM | POA: Diagnosis not present

## 2021-06-20 DIAGNOSIS — I152 Hypertension secondary to endocrine disorders: Secondary | ICD-10-CM | POA: Diagnosis not present

## 2021-06-20 DIAGNOSIS — R001 Bradycardia, unspecified: Secondary | ICD-10-CM | POA: Diagnosis not present

## 2021-06-20 DIAGNOSIS — E1159 Type 2 diabetes mellitus with other circulatory complications: Secondary | ICD-10-CM | POA: Diagnosis not present

## 2021-06-20 DIAGNOSIS — N1832 Chronic kidney disease, stage 3b: Secondary | ICD-10-CM | POA: Diagnosis not present

## 2021-06-20 DIAGNOSIS — E785 Hyperlipidemia, unspecified: Secondary | ICD-10-CM | POA: Diagnosis not present

## 2021-06-20 DIAGNOSIS — E1169 Type 2 diabetes mellitus with other specified complication: Secondary | ICD-10-CM | POA: Diagnosis not present

## 2021-06-26 ENCOUNTER — Other Ambulatory Visit: Payer: Medicare HMO

## 2021-06-27 ENCOUNTER — Inpatient Hospital Stay: Payer: Medicare HMO | Attending: Oncology | Admitting: Oncology

## 2021-06-27 ENCOUNTER — Other Ambulatory Visit: Payer: Self-pay

## 2021-06-27 DIAGNOSIS — D508 Other iron deficiency anemias: Secondary | ICD-10-CM | POA: Diagnosis not present

## 2021-06-27 LAB — CBC WITH DIFFERENTIAL/PLATELET
Abs Immature Granulocytes: 0.02 10*3/uL (ref 0.00–0.07)
Basophils Absolute: 0 10*3/uL (ref 0.0–0.1)
Basophils Relative: 1 %
Eosinophils Absolute: 0.4 10*3/uL (ref 0.0–0.5)
Eosinophils Relative: 7 %
HCT: 28.4 % — ABNORMAL LOW (ref 36.0–46.0)
Hemoglobin: 9 g/dL — ABNORMAL LOW (ref 12.0–15.0)
Immature Granulocytes: 0 %
Lymphocytes Relative: 11 %
Lymphs Abs: 0.6 10*3/uL — ABNORMAL LOW (ref 0.7–4.0)
MCH: 30.9 pg (ref 26.0–34.0)
MCHC: 31.7 g/dL (ref 30.0–36.0)
MCV: 97.6 fL (ref 80.0–100.0)
Monocytes Absolute: 0.4 10*3/uL (ref 0.1–1.0)
Monocytes Relative: 8 %
Neutro Abs: 4 10*3/uL (ref 1.7–7.7)
Neutrophils Relative %: 73 %
Platelets: 158 10*3/uL (ref 150–400)
RBC: 2.91 MIL/uL — ABNORMAL LOW (ref 3.87–5.11)
RDW: 15.5 % (ref 11.5–15.5)
WBC: 5.5 10*3/uL (ref 4.0–10.5)
nRBC: 0 % (ref 0.0–0.2)

## 2021-06-27 LAB — IRON AND TIBC
Iron: 37 ug/dL (ref 28–170)
Saturation Ratios: 9 % — ABNORMAL LOW (ref 10.4–31.8)
TIBC: 398 ug/dL (ref 250–450)
UIBC: 361 ug/dL

## 2021-06-27 LAB — FERRITIN: Ferritin: 39 ng/mL (ref 11–307)

## 2021-07-20 DIAGNOSIS — E119 Type 2 diabetes mellitus without complications: Secondary | ICD-10-CM | POA: Diagnosis not present

## 2021-08-08 ENCOUNTER — Other Ambulatory Visit: Payer: Self-pay | Admitting: Gastroenterology

## 2021-08-22 ENCOUNTER — Other Ambulatory Visit: Payer: Self-pay | Admitting: *Deleted

## 2021-08-22 DIAGNOSIS — D508 Other iron deficiency anemias: Secondary | ICD-10-CM

## 2021-08-28 ENCOUNTER — Other Ambulatory Visit: Payer: Medicare HMO

## 2021-08-29 ENCOUNTER — Other Ambulatory Visit: Payer: Self-pay

## 2021-08-29 ENCOUNTER — Inpatient Hospital Stay: Payer: Medicare HMO | Attending: Oncology

## 2021-08-29 ENCOUNTER — Other Ambulatory Visit: Payer: Medicare HMO

## 2021-08-29 DIAGNOSIS — D508 Other iron deficiency anemias: Secondary | ICD-10-CM | POA: Insufficient documentation

## 2021-08-29 LAB — CBC WITH DIFFERENTIAL/PLATELET
Abs Immature Granulocytes: 0.01 10*3/uL (ref 0.00–0.07)
Basophils Absolute: 0 10*3/uL (ref 0.0–0.1)
Basophils Relative: 1 %
Eosinophils Absolute: 0.5 10*3/uL (ref 0.0–0.5)
Eosinophils Relative: 10 %
HCT: 24.3 % — ABNORMAL LOW (ref 36.0–46.0)
Hemoglobin: 7.4 g/dL — ABNORMAL LOW (ref 12.0–15.0)
Immature Granulocytes: 0 %
Lymphocytes Relative: 13 %
Lymphs Abs: 0.7 10*3/uL (ref 0.7–4.0)
MCH: 28.6 pg (ref 26.0–34.0)
MCHC: 30.5 g/dL (ref 30.0–36.0)
MCV: 93.8 fL (ref 80.0–100.0)
Monocytes Absolute: 0.3 10*3/uL (ref 0.1–1.0)
Monocytes Relative: 6 %
Neutro Abs: 3.7 10*3/uL (ref 1.7–7.7)
Neutrophils Relative %: 70 %
Platelets: 153 10*3/uL (ref 150–400)
RBC: 2.59 MIL/uL — ABNORMAL LOW (ref 3.87–5.11)
RDW: 14.5 % (ref 11.5–15.5)
WBC: 5.3 10*3/uL (ref 4.0–10.5)
nRBC: 0 % (ref 0.0–0.2)

## 2021-08-29 LAB — FERRITIN: Ferritin: 10 ng/mL — ABNORMAL LOW (ref 11–307)

## 2021-08-29 LAB — IRON AND TIBC
Iron: 19 ug/dL — ABNORMAL LOW (ref 28–170)
Saturation Ratios: 4 % — ABNORMAL LOW (ref 10.4–31.8)
TIBC: 462 ug/dL — ABNORMAL HIGH (ref 250–450)
UIBC: 443 ug/dL

## 2021-09-01 ENCOUNTER — Other Ambulatory Visit: Payer: Self-pay

## 2021-09-01 ENCOUNTER — Inpatient Hospital Stay: Payer: Medicare HMO

## 2021-09-01 VITALS — BP 140/51 | HR 83 | Temp 97.6°F | Resp 20

## 2021-09-01 DIAGNOSIS — D508 Other iron deficiency anemias: Secondary | ICD-10-CM

## 2021-09-01 MED ORDER — SODIUM CHLORIDE 0.9 % IV SOLN
Freq: Once | INTRAVENOUS | Status: AC
  Filled 2021-09-01: qty 250

## 2021-09-01 MED ORDER — SODIUM CHLORIDE 0.9 % IV SOLN: 200.0000 mg | Freq: Once | INTRAVENOUS | Status: DC

## 2021-09-01 MED ORDER — IRON SUCROSE 20 MG/ML IV SOLN
200.0000 mg | Freq: Once | INTRAVENOUS | Status: AC
  Administered 2021-09-01: 200 mg via INTRAVENOUS
  Filled 2021-09-01: qty 10

## 2021-09-01 NOTE — Patient Instructions (Signed)

## 2021-09-06 ENCOUNTER — Inpatient Hospital Stay: Payer: Medicare HMO | Admitting: Oncology

## 2021-09-06 ENCOUNTER — Other Ambulatory Visit: Payer: Self-pay

## 2021-09-06 ENCOUNTER — Inpatient Hospital Stay: Payer: Medicare HMO | Attending: Oncology

## 2021-09-06 ENCOUNTER — Encounter: Payer: Self-pay | Admitting: Oncology

## 2021-09-06 VITALS — BP 118/49 | HR 59 | Temp 98.9°F | Resp 18 | Ht 64.0 in | Wt 182.0 lb

## 2021-09-06 VITALS — BP 129/48 | HR 59 | Resp 16

## 2021-09-06 DIAGNOSIS — D509 Iron deficiency anemia, unspecified: Secondary | ICD-10-CM

## 2021-09-06 DIAGNOSIS — K5521 Angiodysplasia of colon with hemorrhage: Secondary | ICD-10-CM | POA: Diagnosis not present

## 2021-09-06 DIAGNOSIS — Z87891 Personal history of nicotine dependence: Secondary | ICD-10-CM | POA: Insufficient documentation

## 2021-09-06 DIAGNOSIS — D508 Other iron deficiency anemias: Secondary | ICD-10-CM

## 2021-09-06 DIAGNOSIS — D500 Iron deficiency anemia secondary to blood loss (chronic): Secondary | ICD-10-CM | POA: Insufficient documentation

## 2021-09-06 DIAGNOSIS — M069 Rheumatoid arthritis, unspecified: Secondary | ICD-10-CM | POA: Diagnosis not present

## 2021-09-06 MED ORDER — SODIUM CHLORIDE 0.9 % IV SOLN: 200.0000 mg | Freq: Once | INTRAVENOUS | Status: DC

## 2021-09-06 MED ORDER — SODIUM CHLORIDE 0.9 % IV SOLN
Freq: Once | INTRAVENOUS | Status: AC
  Filled 2021-09-06: qty 250

## 2021-09-06 MED ORDER — IRON SUCROSE 20 MG/ML IV SOLN
200.0000 mg | Freq: Once | INTRAVENOUS | Status: AC
  Administered 2021-09-06: 200 mg via INTRAVENOUS
  Filled 2021-09-06: qty 10

## 2021-09-06 NOTE — Patient Instructions (Signed)

## 2021-09-06 NOTE — Progress Notes (Signed)
Hematology/Oncology Consult note [facility]  Telephone:([phone] [phone]  Patient Care Team: Sofie Hartigan, MD as PCP - General (Family Medicine)   Name of the patient: Emily Faulkner  [id number]  1940-09-16   Date of visit: 09/06/21  Diagnosis- iron deficiency anemia possibly secondary to bleeding AVM  Chief complaint/ Reason for visit-routine follow-up of iron deficiency anemia  Heme/Onc history: Patient is a 81 year old female with a past medical history significant for stage III CKD, hypertension diabetes and left renal mass who was found to have a hemoglobin of 8.7 at nephrology office and hence referred the patient for anemia.  For a renal mass she has been followed by Dr. Erlene Quan in the masses thought to be slow-growing and conservative management was recommended.  Patient presently reports ongoing fatigue.  Denies any blood loss in her stool or urine.  Denies any dark melanotic stools.  Patient has had a complete GI work-up including endoscopy colonoscopy and small bowel endoscopy in 2019 by Dr. Vicente Males colonoscopy showed multiple bleeding colonic angioectasias treated with APC small bowel endoscopy showed nonbleeding angioectasias in the jejunum treated with APC   Patient found to have significant iron deficiency anemia with a hemoglobin of 6.9 requiring IV iron.  Also seen by Dr. Marius Ditch and underwent push enteroscopy which did not show any evidence of active bleeding but showed chronic atrophic gastritis    Interval history-reports ongoing fatigue.  She is unable to tolerate any oral iron.  Denies any blood loss in her stool or urine.  Denies any dark melanotic stools  ECOG PS- 2 Pain scale- 0   Review of systems- Review of Systems  Constitutional:  Positive for malaise/fatigue. Negative for chills, fever and weight loss.  HENT:  Negative for congestion, ear discharge and nosebleeds.   Eyes:  Negative for blurred vision.   Respiratory:  Negative for cough, hemoptysis, sputum production, shortness of breath and wheezing.   Cardiovascular:  Negative for chest pain, palpitations, orthopnea and claudication.  Gastrointestinal:  Negative for abdominal pain, blood in stool, constipation, diarrhea, heartburn, melena, nausea and vomiting.  Genitourinary:  Negative for dysuria, flank pain, frequency, hematuria and urgency.  Musculoskeletal:  Negative for back pain, joint pain and myalgias.  Skin:  Negative for rash.  Neurological:  Negative for dizziness, tingling, focal weakness, seizures, weakness and headaches.  Endo/Heme/Allergies:  Does not bruise/bleed easily.  Psychiatric/Behavioral:  Negative for depression and suicidal ideas. The patient does not have insomnia.       No Known Allergies   Past Medical History:  Diagnosis Date   Acute kidney failure ([place]) 11/16/2020   AKI (acute kidney injury) ([place]) 03/17/2018   Anemia    Cancer (HCC)    breast cancer right mastectomy   Diabetes mellitus without complication ([place])    GIB (gastrointestinal bleeding) 02/20/2018   Glaucoma    Hyperkalemia 10/24/2020   Hyperlipidemia    Hypertension    Neuropathy    Odynophagia 10/24/2020   Osteoarthritis    Rheumatoid arthritis ([place])    Syncope and collapse 10/24/2020   Telangiectasias      Past Surgical History:  Procedure Laterality Date   BREAST SURGERY     right mastectomy   CHOLECYSTECTOMY     COLONOSCOPY     COLONOSCOPY WITH PROPOFOL N/A 08/14/2016   Procedure: COLONOSCOPY WITH PROPOFOL;  Surgeon: Lollie Sails, MD;  Location: [facility] ENDOSCOPY;  Service: Endoscopy;  Laterality: N/A;   COLONOSCOPY WITH PROPOFOL N/A 02/22/2018  Procedure: COLONOSCOPY WITH PROPOFOL;  Surgeon: Jonathon Bellows, MD;  Location: [facility] ENDOSCOPY;  Service: Gastroenterology;  Laterality: N/A;   ENTEROSCOPY N/A 04/12/2021   Procedure: ENTEROSCOPY;  Surgeon: Lin Landsman, MD;  Location: [facility] ENDOSCOPY;  Service:  Gastroenterology;  Laterality: N/A;  push   ESOPHAGOGASTRODUODENOSCOPY (EGD) WITH PROPOFOL N/A 08/14/2016   Procedure: ESOPHAGOGASTRODUODENOSCOPY (EGD) WITH PROPOFOL;  Surgeon: Lollie Sails, MD;  Location: [facility] ENDOSCOPY;  Service: Endoscopy;  Laterality: N/A;   ESOPHAGOGASTRODUODENOSCOPY (EGD) WITH PROPOFOL N/A 01/30/2018   Procedure: ESOPHAGOGASTRODUODENOSCOPY (EGD) WITH PROPOFOL;  Surgeon: Toledo, Benay Pike, MD;  Location: [facility] ENDOSCOPY;  Service: Gastroenterology;  Laterality: N/A;   ESOPHAGOGASTRODUODENOSCOPY (EGD) WITH PROPOFOL N/A 02/22/2018   Procedure: ESOPHAGOGASTRODUODENOSCOPY (EGD) WITH PROPOFOL;  Surgeon: Jonathon Bellows, MD;  Location: [facility] ENDOSCOPY;  Service: Gastroenterology;  Laterality: N/A;   EXCISIONAL HEMORRHOIDECTOMY     KYPHOPLASTY N/A 03/20/2018   Procedure: [id number];  Surgeon: Hessie Knows, MD;  Location: [facility] ORS;  Service: Orthopedics;  Laterality: N/A;   PACEMAKER IMPLANT N/A 10/26/2020   Procedure: PACEMAKER IMPLANT;  Surgeon: Isaias Cowman, MD;  Location: [place] CV LAB;  Service: Cardiovascular;  Laterality: N/A;    Social History   Socioeconomic History   Marital status: Married    Spouse name: Not on file   Number of children: Not on file   Years of education: Not on file   Highest education level: Not on file  Occupational History   Not on file  Tobacco Use   Smoking status: Former    Types: Cigarettes    Quit date: 07/10/2011    Years since quitting: 10.1   Smokeless tobacco: Never  Vaping Use   Vaping Use: Never used  Substance and Sexual Activity   Alcohol use: No   Drug use: No   Sexual activity: Not Currently  Other Topics Concern   Not on file  Social History Narrative   Not on file   Social Determinants of Health   Financial Resource Strain: Not on file  Food Insecurity: Not on file  Transportation Needs: Not on file  Physical Activity: Not on file  Stress: Not on file  Social Connections: Not  on file  Intimate Partner Violence: Not on file    Family History  Problem Relation Age of Onset   Kidney cancer Son    Bladder Cancer Neg Hx      Current Outpatient Medications:    ACCU-CHEK AVIVA PLUS test strip, , Disp: , Rfl:    ACCU-CHEK SOFTCLIX LANCETS lancets, once daily Use as instructed., Disp: , Rfl:    acetaminophen (TYLENOL) 500 MG tablet, Take 500 mg by mouth every 8 (eight) hours as needed., Disp: , Rfl:    alendronate (FOSAMAX) 70 MG tablet, TAKE 1 TABLET BY MOUTH EVERY 7DAYS WITH A FULL GLASS OF WATER. DO NOT LIE DOWN FOR 30 MINUTES, Disp: , Rfl:    Biotin 1000 MCG tablet, Take 1,000 mcg by mouth daily., Disp: , Rfl:    Cholecalciferol 50 MCG (2000 UT) CAPS, Take 2,000 Units by mouth daily., Disp: , Rfl:    doxepin (SINEQUAN) 25 MG capsule, Take 25 mg by mouth daily., Disp: , Rfl:    feeding supplement, ENSURE ENLIVE, (ENSURE ENLIVE) LIQD, Take 237 mLs by mouth 2 (two) times daily between meals., Disp: 237 mL, Rfl: 12   glipiZIDE (GLUCOTROL) 5 MG tablet, 5 mg daily before breakfast., Disp: , Rfl:    insulin glargine (LANTUS) 100 UNIT/ML Solostar Pen, Inject 34 Units into the skin  daily. (may take up to 50u based upon blood glucose reading), Disp: , Rfl:    Insulin Pen Needle 32G X 4 MM MISC, USE 1 NEEDLE SUBCUTANEOUSLY ONCE A DAY AS DIRECTED, Disp: , Rfl:    ketoconazole (NIZORAL) 2 % shampoo, Apply 1 application topically daily. (apply to scalp and wait at least 30 minutes before rinsing), Disp: , Rfl:    losartan (COZAAR) 25 MG tablet, Take 25 mg by mouth daily., Disp: , Rfl:    Magnesium 100 MG CAPS, Take 4 capsules by mouth daily., Disp: , Rfl:    metFORMIN (GLUCOPHAGE-XR) 500 MG 24 hr tablet, Take 500 mg by mouth., Disp: , Rfl:    niacin (NIASPAN) 500 MG CR tablet, Take 500 mg by mouth every evening., Disp: , Rfl: 2   Omega-3 Fatty Acids (FISH OIL) 1000 MG CAPS, Take 1 capsule by mouth., Disp: , Rfl:    omeprazole (PRILOSEC) 40 MG capsule, TAKE 1  CAPSULE BY MOUTH EVERY DAY BEFORE BREAKFAST, Disp: 90 capsule, Rfl: 1   omeprazole (PRILOSEC) 40 MG capsule, Take 1 capsule (40 mg total) by mouth daily before breakfast., Disp: 30 capsule, Rfl: 3   omeprazole (PRILOSEC) 40 MG capsule, TAKE 1 CAPSULE BY MOUTH EVERY DAY BEFORE BREAKFAST, Disp: 90 capsule, Rfl: 1   simvastatin (ZOCOR) 20 MG tablet, Take 20 mg by mouth at bedtime., Disp: , Rfl: 6   sitaGLIPtin (JANUVIA) 50 MG tablet, Take 50 mg by mouth daily., Disp: , Rfl:    TRAVATAN Z 0.004 % SOLN ophthalmic solution, Place 1 drop into both eyes at bedtime., Disp: , Rfl:    vancomycin (VANCOCIN) 125 MG capsule, , Disp: , Rfl:    vitamin B-12 (CYANOCOBALAMIN) 1000 MCG tablet, Take 1,000 mcg by mouth daily., Disp: , Rfl:   Physical exam:  Vitals:   09/06/21 1341  BP: (!) 118/49  Pulse: (!) 59  Resp: 18  Temp: 98.9 F (37.2 C)  TempSrc: Tympanic  SpO2: 99%  Weight: 182 lb (82.6 kg)  Height: 5' 4"  (1.626 m)   Physical Exam Constitutional:      Comments: Sitting in a wheelchair.  Appears fatigued  Cardiovascular:     Rate and Rhythm: Normal rate and regular rhythm.     Heart sounds: Normal heart sounds.  Pulmonary:     Effort: Pulmonary effort is normal.     Breath sounds: Normal breath sounds.  Abdominal:     General: Bowel sounds are normal.     Palpations: Abdomen is soft.  Skin:    General: Skin is warm and dry.  Neurological:     Mental Status: She is alert and oriented to person, place, and time.     CMP Latest Ref Rng & Units 04/26/2021  Glucose 70 - 99 mg/dL 205(H)  BUN 8 - 23 mg/dL 14  Creatinine 0.44 - 1.00 mg/dL 1.12(H)  Sodium 135 - 145 mmol/L 135  Potassium 3.5 - 5.1 mmol/L 3.7  Chloride 98 - 111 mmol/L 103  CO2 22 - 32 mmol/L 24  Calcium 8.9 - 10.3 mg/dL 9.1  Total Protein 6.5 - 8.1 g/dL 7.1  Total Bilirubin 0.3 - 1.2 mg/dL 1.0  Alkaline Phos 38 - 126 U/L 63  AST 15 - 41 U/L 28  ALT 0 - 44 U/L 14   CBC Latest Ref Rng & Units 08/29/2021  WBC 4.0 -  10.5 K/uL 5.3  Hemoglobin 12.0 - 15.0 g/dL 7.4(L)  Hematocrit 36.0 - 46.0 % 24.3(L)  Platelets 150 - 400  K/uL 153    No images are attached to the encounter.  No results found.   Assessment and plan- Patient is a 81 y.o. female here for routine follow-up of iron deficiency anemia secondary to bleeding AVMs  Patient's hemoglobin after receiving IV iron back in September 2022 improved to 11.  It has again dropped down to 7.4 at this time.  She will therefore proceed with total 5 doses of Venofer at this time as planned.  Given that she is having recurrent episodes of iron deficiency secondary to AVMs from the GI tract I discussed trying long-acting octreotide 20 mg IM which has been shown to reduce recurrent bleeding in patients with GI angiodysplasia.  During treatment patient's experienced lower bleeding events 20% versus 73% with lower transfusion requirements as well.  Patient understands and agrees to proceed as planned.  CBC ferritin and iron studies in 4 weeks and 8 weeks and I will see her back in 8 weeks.  Patient will continue to receive octreotide once a month   Visit Diagnosis 1. Iron deficiency anemia, unspecified iron deficiency anemia type   2. AVM (arteriovenous malformation) of colon with hemorrhage      Dr. Randa Evens, MD, MPH [facility] [phone] 09/06/2021 1:48 PM

## 2021-09-08 ENCOUNTER — Other Ambulatory Visit: Payer: Self-pay

## 2021-09-08 ENCOUNTER — Inpatient Hospital Stay: Payer: Medicare HMO

## 2021-09-08 VITALS — BP 132/42 | HR 57 | Temp 98.0°F | Resp 16

## 2021-09-08 DIAGNOSIS — D508 Other iron deficiency anemias: Secondary | ICD-10-CM

## 2021-09-08 DIAGNOSIS — D500 Iron deficiency anemia secondary to blood loss (chronic): Secondary | ICD-10-CM | POA: Diagnosis not present

## 2021-09-08 DIAGNOSIS — Z87891 Personal history of nicotine dependence: Secondary | ICD-10-CM | POA: Diagnosis not present

## 2021-09-08 DIAGNOSIS — K5521 Angiodysplasia of colon with hemorrhage: Secondary | ICD-10-CM | POA: Diagnosis not present

## 2021-09-08 MED ORDER — OCTREOTIDE ACETATE 20 MG IM KIT
20.0000 mg | PACK | INTRAMUSCULAR | Status: DC
  Administered 2021-09-08: 20 mg via INTRAMUSCULAR
  Filled 2021-09-08: qty 1

## 2021-09-08 MED ORDER — SODIUM CHLORIDE 0.9 % IV SOLN: 200.0000 mg | Freq: Once | INTRAVENOUS | Status: DC

## 2021-09-08 MED ORDER — IRON SUCROSE 20 MG/ML IV SOLN
200.0000 mg | Freq: Once | INTRAVENOUS | Status: AC
  Administered 2021-09-08: 200 mg via INTRAVENOUS
  Filled 2021-09-08: qty 10

## 2021-09-08 MED ORDER — SODIUM CHLORIDE 0.9 % IV SOLN
INTRAVENOUS | Status: DC
  Filled 2021-09-08: qty 250

## 2021-09-13 DIAGNOSIS — E1122 Type 2 diabetes mellitus with diabetic chronic kidney disease: Secondary | ICD-10-CM | POA: Diagnosis not present

## 2021-09-13 DIAGNOSIS — D508 Other iron deficiency anemias: Secondary | ICD-10-CM | POA: Diagnosis not present

## 2021-09-13 DIAGNOSIS — E782 Mixed hyperlipidemia: Secondary | ICD-10-CM | POA: Diagnosis not present

## 2021-09-13 DIAGNOSIS — E1140 Type 2 diabetes mellitus with diabetic neuropathy, unspecified: Secondary | ICD-10-CM | POA: Diagnosis not present

## 2021-09-13 DIAGNOSIS — N1832 Chronic kidney disease, stage 3b: Secondary | ICD-10-CM | POA: Diagnosis not present

## 2021-09-13 DIAGNOSIS — I129 Hypertensive chronic kidney disease with stage 1 through stage 4 chronic kidney disease, or unspecified chronic kidney disease: Secondary | ICD-10-CM | POA: Diagnosis not present

## 2021-09-13 DIAGNOSIS — I700 Atherosclerosis of aorta: Secondary | ICD-10-CM | POA: Diagnosis not present

## 2021-09-13 DIAGNOSIS — E11649 Type 2 diabetes mellitus with hypoglycemia without coma: Secondary | ICD-10-CM | POA: Diagnosis not present

## 2021-09-13 DIAGNOSIS — K7460 Unspecified cirrhosis of liver: Secondary | ICD-10-CM | POA: Diagnosis not present

## 2021-09-14 ENCOUNTER — Inpatient Hospital Stay: Payer: Medicare HMO

## 2021-09-14 ENCOUNTER — Other Ambulatory Visit: Payer: Self-pay

## 2021-09-14 VITALS — BP 158/58 | HR 77 | Temp 99.6°F | Resp 20

## 2021-09-14 DIAGNOSIS — Z87891 Personal history of nicotine dependence: Secondary | ICD-10-CM | POA: Diagnosis not present

## 2021-09-14 DIAGNOSIS — D508 Other iron deficiency anemias: Secondary | ICD-10-CM

## 2021-09-14 DIAGNOSIS — K5521 Angiodysplasia of colon with hemorrhage: Secondary | ICD-10-CM | POA: Diagnosis not present

## 2021-09-14 DIAGNOSIS — D500 Iron deficiency anemia secondary to blood loss (chronic): Secondary | ICD-10-CM | POA: Diagnosis not present

## 2021-09-14 MED ORDER — SODIUM CHLORIDE 0.9 % IV SOLN
INTRAVENOUS | Status: DC
  Filled 2021-09-14: qty 250

## 2021-09-14 MED ORDER — IRON SUCROSE 20 MG/ML IV SOLN
200.0000 mg | Freq: Once | INTRAVENOUS | Status: AC
  Administered 2021-09-14: 15:00:00 200 mg via INTRAVENOUS
  Filled 2021-09-14: qty 10

## 2021-09-14 MED ORDER — SODIUM CHLORIDE 0.9 % IV SOLN: 200.0000 mg | Freq: Once | INTRAVENOUS | Status: DC

## 2021-09-14 NOTE — Patient Instructions (Signed)

## 2021-09-18 DIAGNOSIS — E782 Mixed hyperlipidemia: Secondary | ICD-10-CM | POA: Diagnosis not present

## 2021-09-19 ENCOUNTER — Inpatient Hospital Stay: Payer: Medicare HMO

## 2021-09-19 ENCOUNTER — Other Ambulatory Visit: Payer: Self-pay

## 2021-09-19 VITALS — BP 141/54 | HR 70 | Temp 97.7°F | Resp 18

## 2021-09-19 DIAGNOSIS — D500 Iron deficiency anemia secondary to blood loss (chronic): Secondary | ICD-10-CM | POA: Diagnosis not present

## 2021-09-19 DIAGNOSIS — D508 Other iron deficiency anemias: Secondary | ICD-10-CM

## 2021-09-19 DIAGNOSIS — R001 Bradycardia, unspecified: Secondary | ICD-10-CM | POA: Diagnosis not present

## 2021-09-19 DIAGNOSIS — K5521 Angiodysplasia of colon with hemorrhage: Secondary | ICD-10-CM | POA: Diagnosis not present

## 2021-09-19 DIAGNOSIS — Z87891 Personal history of nicotine dependence: Secondary | ICD-10-CM | POA: Diagnosis not present

## 2021-09-19 MED ORDER — SODIUM CHLORIDE 0.9 % IV SOLN
Freq: Once | INTRAVENOUS | Status: AC
  Filled 2021-09-19: qty 250

## 2021-09-19 MED ORDER — SODIUM CHLORIDE 0.9 % IV SOLN: 200.0000 mg | Freq: Once | INTRAVENOUS | Status: DC

## 2021-09-19 MED ORDER — IRON SUCROSE 20 MG/ML IV SOLN
200.0000 mg | Freq: Once | INTRAVENOUS | Status: AC
  Administered 2021-09-19: 200 mg via INTRAVENOUS
  Filled 2021-09-19: qty 10

## 2021-09-19 NOTE — Patient Instructions (Signed)

## 2021-10-06 ENCOUNTER — Inpatient Hospital Stay: Payer: Medicare HMO

## 2021-10-06 DIAGNOSIS — Z87891 Personal history of nicotine dependence: Secondary | ICD-10-CM | POA: Diagnosis not present

## 2021-10-06 DIAGNOSIS — D500 Iron deficiency anemia secondary to blood loss (chronic): Secondary | ICD-10-CM | POA: Diagnosis not present

## 2021-10-06 DIAGNOSIS — K5521 Angiodysplasia of colon with hemorrhage: Secondary | ICD-10-CM | POA: Diagnosis not present

## 2021-10-06 DIAGNOSIS — D508 Other iron deficiency anemias: Secondary | ICD-10-CM

## 2021-10-06 LAB — IRON AND TIBC
Iron: 32 ug/dL (ref 28–170)
Saturation Ratios: 10 % — ABNORMAL LOW (ref 10.4–31.8)
TIBC: 318 ug/dL (ref 250–450)
UIBC: 286 ug/dL

## 2021-10-06 LAB — FERRITIN: Ferritin: 58 ng/mL (ref 11–307)

## 2021-10-06 LAB — CBC WITH DIFFERENTIAL/PLATELET
Abs Immature Granulocytes: 0.02 10*3/uL (ref 0.00–0.07)
Basophils Absolute: 0 10*3/uL (ref 0.0–0.1)
Basophils Relative: 1 %
Eosinophils Absolute: 0.4 10*3/uL (ref 0.0–0.5)
Eosinophils Relative: 8 %
HCT: 29.4 % — ABNORMAL LOW (ref 36.0–46.0)
Hemoglobin: 9.2 g/dL — ABNORMAL LOW (ref 12.0–15.0)
Immature Granulocytes: 0 %
Lymphocytes Relative: 11 %
Lymphs Abs: 0.6 10*3/uL — ABNORMAL LOW (ref 0.7–4.0)
MCH: 30.5 pg (ref 26.0–34.0)
MCHC: 31.3 g/dL (ref 30.0–36.0)
MCV: 97.4 fL (ref 80.0–100.0)
Monocytes Absolute: 0.4 10*3/uL (ref 0.1–1.0)
Monocytes Relative: 7 %
Neutro Abs: 3.7 10*3/uL (ref 1.7–7.7)
Neutrophils Relative %: 73 %
Platelets: 132 10*3/uL — ABNORMAL LOW (ref 150–400)
RBC: 3.02 MIL/uL — ABNORMAL LOW (ref 3.87–5.11)
RDW: 19.6 % — ABNORMAL HIGH (ref 11.5–15.5)
WBC: 5 10*3/uL (ref 4.0–10.5)
nRBC: 0 % (ref 0.0–0.2)

## 2021-10-06 MED ORDER — OCTREOTIDE ACETATE 20 MG IM KIT
20.0000 mg | PACK | INTRAMUSCULAR | Status: DC
  Administered 2021-10-06: 20 mg via INTRAMUSCULAR
  Filled 2021-10-06: qty 1

## 2021-10-18 DIAGNOSIS — N2889 Other specified disorders of kidney and ureter: Secondary | ICD-10-CM | POA: Diagnosis not present

## 2021-10-18 DIAGNOSIS — E1122 Type 2 diabetes mellitus with diabetic chronic kidney disease: Secondary | ICD-10-CM | POA: Diagnosis not present

## 2021-10-18 DIAGNOSIS — N1832 Chronic kidney disease, stage 3b: Secondary | ICD-10-CM | POA: Diagnosis not present

## 2021-10-18 DIAGNOSIS — I10 Essential (primary) hypertension: Secondary | ICD-10-CM | POA: Diagnosis not present

## 2021-10-18 DIAGNOSIS — D631 Anemia in chronic kidney disease: Secondary | ICD-10-CM | POA: Diagnosis not present

## 2021-10-25 ENCOUNTER — Other Ambulatory Visit: Payer: Medicare HMO

## 2021-10-25 ENCOUNTER — Ambulatory Visit: Payer: Medicare HMO | Admitting: Oncology

## 2021-11-01 ENCOUNTER — Other Ambulatory Visit: Payer: Self-pay

## 2021-11-01 ENCOUNTER — Other Ambulatory Visit: Payer: Self-pay | Admitting: *Deleted

## 2021-11-01 DIAGNOSIS — D508 Other iron deficiency anemias: Secondary | ICD-10-CM

## 2021-11-03 ENCOUNTER — Inpatient Hospital Stay: Payer: Medicare HMO

## 2021-11-03 ENCOUNTER — Inpatient Hospital Stay: Payer: Medicare HMO | Admitting: Oncology

## 2021-11-03 ENCOUNTER — Encounter: Payer: Self-pay | Admitting: Oncology

## 2021-11-03 ENCOUNTER — Inpatient Hospital Stay: Payer: Medicare HMO | Attending: Oncology

## 2021-11-03 VITALS — BP 135/56 | HR 82 | Temp 97.2°F | Resp 18 | Ht 64.0 in | Wt 185.2 lb

## 2021-11-03 DIAGNOSIS — D508 Other iron deficiency anemias: Secondary | ICD-10-CM

## 2021-11-03 DIAGNOSIS — K5521 Angiodysplasia of colon with hemorrhage: Secondary | ICD-10-CM | POA: Insufficient documentation

## 2021-11-03 DIAGNOSIS — Z5181 Encounter for therapeutic drug level monitoring: Secondary | ICD-10-CM

## 2021-11-03 DIAGNOSIS — Z79899 Other long term (current) drug therapy: Secondary | ICD-10-CM | POA: Diagnosis not present

## 2021-11-03 DIAGNOSIS — D500 Iron deficiency anemia secondary to blood loss (chronic): Secondary | ICD-10-CM | POA: Diagnosis not present

## 2021-11-03 DIAGNOSIS — Z87891 Personal history of nicotine dependence: Secondary | ICD-10-CM | POA: Diagnosis not present

## 2021-11-03 LAB — IRON AND TIBC
Iron: 35 ug/dL (ref 28–170)
Saturation Ratios: 9 % — ABNORMAL LOW (ref 10.4–31.8)
TIBC: 374 ug/dL (ref 250–450)
UIBC: 339 ug/dL

## 2021-11-03 LAB — CBC
HCT: 30.2 % — ABNORMAL LOW (ref 36.0–46.0)
Hemoglobin: 9.4 g/dL — ABNORMAL LOW (ref 12.0–15.0)
MCH: 29.7 pg (ref 26.0–34.0)
MCHC: 31.1 g/dL (ref 30.0–36.0)
MCV: 95.3 fL (ref 80.0–100.0)
Platelets: 142 10*3/uL — ABNORMAL LOW (ref 150–400)
RBC: 3.17 MIL/uL — ABNORMAL LOW (ref 3.87–5.11)
RDW: 17.4 % — ABNORMAL HIGH (ref 11.5–15.5)
WBC: 5.5 10*3/uL (ref 4.0–10.5)
nRBC: 0 % (ref 0.0–0.2)

## 2021-11-03 LAB — FERRITIN: Ferritin: 25 ng/mL (ref 11–307)

## 2021-11-03 MED ORDER — OCTREOTIDE ACETATE 20 MG IM KIT
20.0000 mg | PACK | INTRAMUSCULAR | Status: DC
  Administered 2021-11-03: 20 mg via INTRAMUSCULAR
  Filled 2021-11-03: qty 1

## 2021-11-03 NOTE — Progress Notes (Signed)
? ? ? ?Hematology/Oncology Consult note ?[place]  ?Telephone:(336) [phone] [phone] ? ?Patient Care Team: ?Sofie Hartigan, MD as PCP - General (Family Medicine)  ? ?Name of the patient: Emily Faulkner  ?[id number]  ?November 01, 1940  ? ?Date of visit: 11/03/21 ? ?Diagnosis-  iron deficiency anemia possibly secondary to bleeding AVM ? ?Chief complaint/ Reason for visit-routine follow-up of iron deficiency anemia ? ?Heme/Onc history: Patient is a 81 year old female with a past medical history significant for stage III CKD, hypertension diabetes and left renal mass who was found to have a hemoglobin of 8.7 at nephrology office and hence referred the patient for anemia.  For a renal mass she has been followed by Dr. Erlene Quan in the masses thought to be slow-growing and conservative management was recommended.  Patient presently reports ongoing fatigue.  Denies any blood loss in her stool or urine.  Denies any dark melanotic stools.  Patient has had a complete GI work-up including endoscopy colonoscopy and small bowel endoscopy in 2019 by Dr. Vicente Males colonoscopy showed multiple bleeding colonic angioectasias treated with APC small bowel endoscopy showed nonbleeding angioectasias in the jejunum treated with APC ?  ?Patient found to have significant iron deficiency anemia with a hemoglobin of 6.9 requiring IV iron.  Also seen by Dr. Marius Ditch and underwent push enteroscopy which did not show any evidence of active bleeding but showed chronic atrophic gastritis ?  ?  ? ?Interval history-reports doing well for her age.  Energy levels have been stable.  Denies any blood loss in her stool or urine. ? ?ECOG PS- 2 ?Pain scale- 0 ? ? ?Review of systems- Review of Systems  ?Constitutional:  Negative for chills, fever, malaise/fatigue and weight loss.  ?HENT:  Negative for congestion, ear discharge and nosebleeds.   ?Eyes:  Negative for blurred vision.  ?Respiratory:  Negative for cough, hemoptysis, sputum  production, shortness of breath and wheezing.   ?Cardiovascular:  Negative for chest pain, palpitations, orthopnea and claudication.  ?Gastrointestinal:  Negative for abdominal pain, blood in stool, constipation, diarrhea, heartburn, melena, nausea and vomiting.  ?Genitourinary:  Negative for dysuria, flank pain, frequency, hematuria and urgency.  ?Musculoskeletal:  Negative for back pain, joint pain and myalgias.  ?Skin:  Negative for rash.  ?Neurological:  Negative for dizziness, tingling, focal weakness, seizures, weakness and headaches.  ?Endo/Heme/Allergies:  Does not bruise/bleed easily.  ?Psychiatric/Behavioral:  Negative for depression and suicidal ideas. The patient does not have insomnia.    ? ? ? ?No Known Allergies ? ? ?Past Medical History:  ?Diagnosis Date  ? Acute kidney failure ([place]) 11/16/2020  ? AKI (acute kidney injury) ([place]) 03/17/2018  ? Anemia   ? Cancer [facility])   ? breast cancer right mastectomy  ? Diabetes mellitus without complication ([place])   ? GIB (gastrointestinal bleeding) 02/20/2018  ? Glaucoma   ? Hyperkalemia 10/24/2020  ? Hyperlipidemia   ? Hypertension   ? Neuropathy   ? Odynophagia 10/24/2020  ? Osteoarthritis   ? Rheumatoid arthritis ([place])   ? Syncope and collapse 10/24/2020  ? Telangiectasias   ? ? ? ?Past Surgical History:  ?Procedure Laterality Date  ? BREAST SURGERY    ? right mastectomy  ? CHOLECYSTECTOMY    ? COLONOSCOPY    ? COLONOSCOPY WITH PROPOFOL N/A 08/14/2016  ? Procedure: COLONOSCOPY WITH PROPOFOL;  Surgeon: Lollie Sails, MD;  Location: [facility] ENDOSCOPY;  Service: Endoscopy;  Laterality: N/A;  ? COLONOSCOPY WITH PROPOFOL N/A 02/22/2018  ? Procedure: COLONOSCOPY WITH PROPOFOL;  Surgeon: Jonathon Bellows, MD;  Location: [facility] ENDOSCOPY;  Service: Gastroenterology;  Laterality: N/A;  ? ENTEROSCOPY N/A 04/12/2021  ? Procedure: ENTEROSCOPY;  Surgeon: Lin Landsman, MD;  Location: [facility] ENDOSCOPY;  Service: Gastroenterology;  Laterality: N/A;  push  ? ESOPHAGOGASTRODUODENOSCOPY (EGD)  WITH PROPOFOL N/A 08/14/2016  ? Procedure: ESOPHAGOGASTRODUODENOSCOPY (EGD) WITH PROPOFOL;  Surgeon: Lollie Sails, MD;  Location: [facility] ENDOSCOPY;  Service: Endoscopy;  Laterality: N/A;  ? ESOPHAGOGASTRODUODENOSCOPY (EGD) WITH PROPOFOL N/A 01/30/2018  ? Procedure: ESOPHAGOGASTRODUODENOSCOPY (EGD) WITH PROPOFOL;  Surgeon: Toledo, Benay Pike, MD;  Location: [facility] ENDOSCOPY;  Service: Gastroenterology;  Laterality: N/A;  ? ESOPHAGOGASTRODUODENOSCOPY (EGD) WITH PROPOFOL N/A 02/22/2018  ? Procedure: ESOPHAGOGASTRODUODENOSCOPY (EGD) WITH PROPOFOL;  Surgeon: Jonathon Bellows, MD;  Location: [facility] ENDOSCOPY;  Service: Gastroenterology;  Laterality: N/A;  ? EXCISIONAL HEMORRHOIDECTOMY    ? KYPHOPLASTY N/A 03/20/2018  ? Procedure: [id number];  Surgeon: Hessie Knows, MD;  Location: [facility] ORS;  Service: Orthopedics;  Laterality: N/A;  ? PACEMAKER IMPLANT N/A 10/26/2020  ? Procedure: PACEMAKER IMPLANT;  Surgeon: Isaias Cowman, MD;  Location: [place] CV LAB;  Service: Cardiovascular;  Laterality: N/A;  ? ? ?Social History  ? ?Socioeconomic History  ? Marital status: Married  ?  Spouse name: Not on file  ? Number of children: Not on file  ? Years of education: Not on file  ? Highest education level: Not on file  ?Occupational History  ? Not on file  ?Tobacco Use  ? Smoking status: Former  ?  Types: Cigarettes  ?  Quit date: 07/10/2011  ?  Years since quitting: 10.3  ? Smokeless tobacco: Never  ?Vaping Use  ? Vaping Use: Never used  ?Substance and Sexual Activity  ? Alcohol use: No  ? Drug use: No  ? Sexual activity: Not Currently  ?Other Topics Concern  ? Not on file  ?Social History Narrative  ? Not on file  ? ?Social Determinants of Health  ? ?Financial Resource Strain: Not on file  ?Food Insecurity: Not on file  ?Transportation Needs: Not on file  ?Physical Activity: Not on file  ?Stress: Not on file  ?Social Connections: Not on file  ?Intimate Partner Violence: Not on file  ? ? ?Family History  ?Problem Relation Age of  Onset  ? Kidney cancer Son   ? Bladder Cancer Neg Hx   ? ? ? ?Current Outpatient Medications:  ?  ACCU-CHEK AVIVA PLUS test strip, , Disp: , Rfl:  ?  ACCU-CHEK SOFTCLIX LANCETS lancets, once daily Use as instructed., Disp: , Rfl:  ?  acetaminophen (TYLENOL) 500 MG tablet, Take 500 mg by mouth every 8 (eight) hours as needed., Disp: , Rfl:  ?  alendronate (FOSAMAX) 70 MG tablet, TAKE 1 TABLET BY MOUTH EVERY 7DAYS WITH A FULL GLASS OF WATER. DO NOT LIE DOWN FOR 30 MINUTES, Disp: , Rfl:  ?  Biotin 1000 MCG tablet, Take 1,000 mcg by mouth daily., Disp: , Rfl:  ?  Cholecalciferol 50 MCG (2000 UT) CAPS, Take 2,000 Units by mouth daily., Disp: , Rfl:  ?  doxepin (SINEQUAN) 25 MG capsule, Take 25 mg by mouth daily., Disp: , Rfl:  ?  feeding supplement, ENSURE ENLIVE, (ENSURE ENLIVE) LIQD, Take 237 mLs by mouth 2 (two) times daily between meals., Disp: 237 mL, Rfl: 12 ?  glipiZIDE (GLUCOTROL) 5 MG tablet, 5 mg daily before breakfast., Disp: , Rfl:  ?  insulin glargine (LANTUS) 100 UNIT/ML Solostar Pen, Inject 34 Units into the skin daily. (may take up to  50u based upon blood glucose reading), Disp: , Rfl:  ?  Insulin Pen Needle 32G X 4 MM MISC, USE 1 NEEDLE SUBCUTANEOUSLY ONCE A DAY AS DIRECTED, Disp: , Rfl:  ?  ketoconazole (NIZORAL) 2 % shampoo, Apply 1 application topically daily. (apply to scalp and wait at least 30 minutes before rinsing), Disp: , Rfl:  ?  losartan (COZAAR) 25 MG tablet, Take 25 mg by mouth daily., Disp: , Rfl:  ?  Magnesium 100 MG CAPS, Take 4 capsules by mouth daily., Disp: , Rfl:  ?  metFORMIN (GLUCOPHAGE-XR) 500 MG 24 hr tablet, Take 500 mg by mouth., Disp: , Rfl:  ?  niacin (NIASPAN) 500 MG CR tablet, Take 500 mg by mouth every evening., Disp: , Rfl: 2 ?  Omega-3 Fatty Acids (FISH OIL) 1000 MG CAPS, Take 1 capsule by mouth., Disp: , Rfl:  ?  omeprazole (PRILOSEC) 40 MG capsule, TAKE 1 CAPSULE BY MOUTH EVERY DAY BEFORE BREAKFAST, Disp: 90 capsule, Rfl: 1 ?  simvastatin (ZOCOR) 20 MG tablet, Take 20  mg by mouth at bedtime., Disp: , Rfl: 6 ?  sitaGLIPtin (JANUVIA) 50 MG tablet, Take 50 mg by mouth daily., Disp: , Rfl:  ?  TRAVATAN Z 0.004 % SOLN ophthalmic solution, Place 1 drop into both eyes at bed

## 2021-11-03 NOTE — Progress Notes (Signed)
Swelling noted to left foot/ankle.  ?

## 2021-11-13 ENCOUNTER — Inpatient Hospital Stay: Payer: Medicare HMO | Attending: Oncology

## 2021-11-13 VITALS — BP 139/53 | HR 84 | Temp 96.8°F | Resp 18

## 2021-11-13 DIAGNOSIS — Z87891 Personal history of nicotine dependence: Secondary | ICD-10-CM | POA: Insufficient documentation

## 2021-11-13 DIAGNOSIS — D500 Iron deficiency anemia secondary to blood loss (chronic): Secondary | ICD-10-CM | POA: Insufficient documentation

## 2021-11-13 DIAGNOSIS — D508 Other iron deficiency anemias: Secondary | ICD-10-CM

## 2021-11-13 DIAGNOSIS — K5521 Angiodysplasia of colon with hemorrhage: Secondary | ICD-10-CM | POA: Insufficient documentation

## 2021-11-13 MED ORDER — SODIUM CHLORIDE 0.9 % IV SOLN: 200.0000 mg | Freq: Once | INTRAVENOUS | Status: DC

## 2021-11-13 MED ORDER — SODIUM CHLORIDE 0.9 % IV SOLN
INTRAVENOUS | Status: DC
  Filled 2021-11-13: qty 250

## 2021-11-13 MED ORDER — IRON SUCROSE 20 MG/ML IV SOLN
200.0000 mg | Freq: Once | INTRAVENOUS | Status: AC
  Administered 2021-11-13: 200 mg via INTRAVENOUS
  Filled 2021-11-13: qty 10

## 2021-11-15 ENCOUNTER — Inpatient Hospital Stay: Payer: Medicare HMO

## 2021-11-15 VITALS — BP 146/54 | HR 75 | Temp 97.7°F

## 2021-11-15 DIAGNOSIS — D500 Iron deficiency anemia secondary to blood loss (chronic): Secondary | ICD-10-CM | POA: Diagnosis not present

## 2021-11-15 DIAGNOSIS — K5521 Angiodysplasia of colon with hemorrhage: Secondary | ICD-10-CM | POA: Diagnosis not present

## 2021-11-15 DIAGNOSIS — Z87891 Personal history of nicotine dependence: Secondary | ICD-10-CM | POA: Diagnosis not present

## 2021-11-15 DIAGNOSIS — D508 Other iron deficiency anemias: Secondary | ICD-10-CM

## 2021-11-15 MED ORDER — SODIUM CHLORIDE 0.9 % IV SOLN: 200.0000 mg | Freq: Once | INTRAVENOUS | Status: DC

## 2021-11-15 MED ORDER — IRON SUCROSE 20 MG/ML IV SOLN
200.0000 mg | Freq: Once | INTRAVENOUS | Status: AC
  Administered 2021-11-15: 200 mg via INTRAVENOUS

## 2021-11-15 NOTE — Progress Notes (Signed)
Patient tolerated Venofer infusion well, no questions/concerns voiced. Patient stable at discharge. AVS given.   ?

## 2021-11-15 NOTE — Patient Instructions (Signed)

## 2021-11-21 ENCOUNTER — Inpatient Hospital Stay: Payer: Medicare HMO

## 2021-11-21 VITALS — BP 134/47 | HR 75 | Temp 98.3°F | Resp 16

## 2021-11-21 DIAGNOSIS — Z87891 Personal history of nicotine dependence: Secondary | ICD-10-CM | POA: Diagnosis not present

## 2021-11-21 DIAGNOSIS — K5521 Angiodysplasia of colon with hemorrhage: Secondary | ICD-10-CM | POA: Diagnosis not present

## 2021-11-21 DIAGNOSIS — D500 Iron deficiency anemia secondary to blood loss (chronic): Secondary | ICD-10-CM | POA: Diagnosis not present

## 2021-11-21 DIAGNOSIS — D508 Other iron deficiency anemias: Secondary | ICD-10-CM

## 2021-11-21 MED ORDER — IRON SUCROSE 20 MG/ML IV SOLN
200.0000 mg | Freq: Once | INTRAVENOUS | Status: AC
  Administered 2021-11-21: 200 mg via INTRAVENOUS
  Filled 2021-11-21: qty 10

## 2021-11-21 MED ORDER — SODIUM CHLORIDE 0.9 % IV SOLN
Freq: Once | INTRAVENOUS | Status: AC
  Filled 2021-11-21: qty 250

## 2021-11-21 MED ORDER — SODIUM CHLORIDE 0.9 % IV SOLN: 200.0000 mg | Freq: Once | INTRAVENOUS | Status: DC

## 2021-11-21 NOTE — Patient Instructions (Signed)

## 2021-11-23 ENCOUNTER — Inpatient Hospital Stay: Payer: Medicare HMO

## 2021-11-23 VITALS — BP 143/52 | HR 85 | Temp 97.4°F | Resp 20

## 2021-11-23 DIAGNOSIS — K5521 Angiodysplasia of colon with hemorrhage: Secondary | ICD-10-CM | POA: Diagnosis not present

## 2021-11-23 DIAGNOSIS — Z87891 Personal history of nicotine dependence: Secondary | ICD-10-CM | POA: Diagnosis not present

## 2021-11-23 DIAGNOSIS — D500 Iron deficiency anemia secondary to blood loss (chronic): Secondary | ICD-10-CM | POA: Diagnosis not present

## 2021-11-23 DIAGNOSIS — D508 Other iron deficiency anemias: Secondary | ICD-10-CM

## 2021-11-23 MED ORDER — SODIUM CHLORIDE 0.9 % IV SOLN: 200.0000 mg | Freq: Once | INTRAVENOUS | Status: DC

## 2021-11-23 MED ORDER — SODIUM CHLORIDE 0.9 % IV SOLN
INTRAVENOUS | Status: DC
  Filled 2021-11-23: qty 250

## 2021-11-23 MED ORDER — IRON SUCROSE 20 MG/ML IV SOLN
200.0000 mg | Freq: Once | INTRAVENOUS | Status: AC
  Administered 2021-11-23: 200 mg via INTRAVENOUS
  Filled 2021-11-23: qty 10

## 2021-11-27 DIAGNOSIS — M810 Age-related osteoporosis without current pathological fracture: Secondary | ICD-10-CM | POA: Diagnosis not present

## 2021-11-27 DIAGNOSIS — E1142 Type 2 diabetes mellitus with diabetic polyneuropathy: Secondary | ICD-10-CM | POA: Diagnosis not present

## 2021-11-27 DIAGNOSIS — Z794 Long term (current) use of insulin: Secondary | ICD-10-CM | POA: Diagnosis not present

## 2021-11-27 DIAGNOSIS — E1169 Type 2 diabetes mellitus with other specified complication: Secondary | ICD-10-CM | POA: Diagnosis not present

## 2021-11-27 DIAGNOSIS — E785 Hyperlipidemia, unspecified: Secondary | ICD-10-CM | POA: Diagnosis not present

## 2021-11-29 ENCOUNTER — Inpatient Hospital Stay: Payer: Medicare HMO

## 2021-11-29 ENCOUNTER — Other Ambulatory Visit: Payer: Self-pay | Admitting: Oncology

## 2021-11-29 DIAGNOSIS — D508 Other iron deficiency anemias: Secondary | ICD-10-CM

## 2021-11-29 DIAGNOSIS — K5521 Angiodysplasia of colon with hemorrhage: Secondary | ICD-10-CM | POA: Diagnosis not present

## 2021-11-29 DIAGNOSIS — D500 Iron deficiency anemia secondary to blood loss (chronic): Secondary | ICD-10-CM | POA: Diagnosis not present

## 2021-11-29 DIAGNOSIS — Z87891 Personal history of nicotine dependence: Secondary | ICD-10-CM | POA: Diagnosis not present

## 2021-11-29 LAB — CBC WITH DIFFERENTIAL/PLATELET
Abs Immature Granulocytes: 0.01 10*3/uL (ref 0.00–0.07)
Basophils Absolute: 0.1 10*3/uL (ref 0.0–0.1)
Basophils Relative: 1 %
Eosinophils Absolute: 0.5 10*3/uL (ref 0.0–0.5)
Eosinophils Relative: 9 %
HCT: 31.3 % — ABNORMAL LOW (ref 36.0–46.0)
Hemoglobin: 9.7 g/dL — ABNORMAL LOW (ref 12.0–15.0)
Immature Granulocytes: 0 %
Lymphocytes Relative: 15 %
Lymphs Abs: 0.8 10*3/uL (ref 0.7–4.0)
MCH: 30.4 pg (ref 26.0–34.0)
MCHC: 31 g/dL (ref 30.0–36.0)
MCV: 98.1 fL (ref 80.0–100.0)
Monocytes Absolute: 0.4 10*3/uL (ref 0.1–1.0)
Monocytes Relative: 7 %
Neutro Abs: 3.8 10*3/uL (ref 1.7–7.7)
Neutrophils Relative %: 68 %
Platelets: 133 10*3/uL — ABNORMAL LOW (ref 150–400)
RBC: 3.19 MIL/uL — ABNORMAL LOW (ref 3.87–5.11)
RDW: 18.3 % — ABNORMAL HIGH (ref 11.5–15.5)
WBC: 5.5 10*3/uL (ref 4.0–10.5)
nRBC: 0 % (ref 0.0–0.2)

## 2021-11-29 MED ORDER — OCTREOTIDE ACETATE 20 MG IM KIT
20.0000 mg | PACK | INTRAMUSCULAR | Status: DC
  Administered 2021-11-29: 20 mg via INTRAMUSCULAR
  Filled 2021-11-29: qty 1

## 2021-11-29 MED ORDER — SODIUM CHLORIDE 0.9 % IV SOLN: 200.0000 mg | Freq: Once | INTRAVENOUS | Status: DC

## 2021-11-29 MED ORDER — IRON SUCROSE 20 MG/ML IV SOLN: 200.0000 mg | Freq: Once | INTRAVENOUS | Status: DC

## 2021-11-29 MED ORDER — SODIUM CHLORIDE 0.9 % IV SOLN
Freq: Once | INTRAVENOUS | Status: DC
  Filled 2021-11-29: qty 250

## 2021-12-01 ENCOUNTER — Ambulatory Visit: Payer: Medicare HMO

## 2021-12-01 ENCOUNTER — Ambulatory Visit
Admission: RE | Admit: 2021-12-01 | Discharge: 2021-12-01 | Disposition: A | Discharge status: Home / Self Care | Payer: Medicare HMO | Source: Ambulatory Visit | Attending: Urology | Admitting: Urology

## 2021-12-01 DIAGNOSIS — N2889 Other specified disorders of kidney and ureter: Secondary | ICD-10-CM | POA: Diagnosis not present

## 2021-12-01 DIAGNOSIS — R188 Other ascites: Secondary | ICD-10-CM | POA: Diagnosis not present

## 2021-12-08 ENCOUNTER — Ambulatory Visit: Payer: Self-pay | Admitting: Urology

## 2021-12-15 ENCOUNTER — Ambulatory Visit: Payer: Medicare HMO | Admitting: Urology

## 2021-12-15 VITALS — BP 169/67 | HR 93 | Ht 64.0 in | Wt 170.0 lb

## 2021-12-15 DIAGNOSIS — N2889 Other specified disorders of kidney and ureter: Secondary | ICD-10-CM

## 2021-12-15 NOTE — Progress Notes (Signed)
12/15/2021 12:56 PM   Emily Faulkner 1941/06/18 [id number]  Referring provider:  Sofie Hartigan, MD [place],  [place] [address] Chief Complaint  Patient presents with   Results      HPI: Emily Faulkner is a 81 y.o.female with a personal history of renal mass an other comorbidities, who returns today for surveillance of an incidental left renal mass.   She was initially seen in 10/2016 at which time a 1.7 x 1.6 cm enhancing interpolar left renal mass was identified.  She underwent follow-up CT abdomen with and without contrast on 01/30/2017 which showed the lesion had enlarged slightly to 1.9 x 1.8 cm which is most consistent with a Bosniak 3 lesion, primarily cystic with thick enhancing  wall.   She was initially seen in 10/2016 at which time a 1.7 x 1.6 cm enhancing interpolar left renal mass was identified.  She underwent follow-up CT abdomen with and without contrast on 01/30/2017 which showed the lesion had enlarged slightly to 1.9 x 1.8 cm which is most consistent with a Bosniak 3 lesion, primarily cystic with thick enhancing  wall.   RUS on 12/01/2021 visualized the known solid renal mass measuring 3.2 x 3.3 x 3.2 cm today versus 2.8 x 2.9 x 2.5 cm previously on the October 14, 2020 renal ultrasound. The mass measured 1.9 x 2.2 x 2.1 cm on the October 18, 2020 renal ultrasound. Diffuse increased cortical echogenicity in the left kidney.  No changes in her medical status.  No flank pain.  No gross hematuria.  No weight loss.     PMH: Past Medical History:  Diagnosis Date   Acute kidney failure ([place]) 11/16/2020   AKI (acute kidney injury) ([place]) 03/17/2018   Anemia    Cancer (HCC)    breast cancer right mastectomy   Diabetes mellitus without complication ([place])    GIB (gastrointestinal bleeding) 02/20/2018   Glaucoma    Hyperkalemia 10/24/2020   Hyperlipidemia    Hypertension    Neuropathy    Odynophagia 10/24/2020   Osteoarthritis    Rheumatoid arthritis ([place])     Syncope and collapse 10/24/2020   Telangiectasias     Surgical History: Past Surgical History:  Procedure Laterality Date   BREAST SURGERY     right mastectomy   CHOLECYSTECTOMY     COLONOSCOPY     COLONOSCOPY WITH PROPOFOL N/A 08/14/2016   Procedure: COLONOSCOPY WITH PROPOFOL;  Surgeon: Lollie Sails, MD;  [facility] ENDOSCOPY;  Service: Endoscopy;  Laterality: N/A;   COLONOSCOPY WITH PROPOFOL N/A 02/22/2018   Procedure: COLONOSCOPY WITH PROPOFOL;  Surgeon: Jonathon Bellows, MD;  Location: [facility] ENDOSCOPY;  Service: Gastroenterology;  Laterality: N/A;   ENTEROSCOPY N/A 04/12/2021   Procedure: ENTEROSCOPY;  Surgeon: Lin Landsman, MD;  Location: [facility] ENDOSCOPY;  Service: Gastroenterology;  Laterality: N/A;  push   ESOPHAGOGASTRODUODENOSCOPY (EGD) WITH PROPOFOL N/A 08/14/2016   Procedure: ESOPHAGOGASTRODUODENOSCOPY (EGD) WITH PROPOFOL;  Surgeon: Lollie Sails, MD;  Location: [facility] ENDOSCOPY;  Service: Endoscopy;  Laterality: N/A;   ESOPHAGOGASTRODUODENOSCOPY (EGD) WITH PROPOFOL N/A 01/30/2018   Procedure: ESOPHAGOGASTRODUODENOSCOPY (EGD) WITH PROPOFOL;  Surgeon: Toledo, Benay Pike, MD;  Location: [facility] ENDOSCOPY;  Service: Gastroenterology;  Laterality: N/A;   ESOPHAGOGASTRODUODENOSCOPY (EGD) WITH PROPOFOL N/A 02/22/2018   Procedure: ESOPHAGOGASTRODUODENOSCOPY (EGD) WITH PROPOFOL;  Surgeon: Jonathon Bellows, MD;  Location: [facility] ENDOSCOPY;  Service: Gastroenterology;  Laterality: N/A;   EXCISIONAL HEMORRHOIDECTOMY     KYPHOPLASTY N/A 03/20/2018   Procedure: Coralyn Helling;  Surgeon: Hessie Knows,  MD;  Location: [facility] ORS;  Service: Orthopedics;  Laterality: N/A;   PACEMAKER IMPLANT N/A 10/26/2020   Procedure: PACEMAKER IMPLANT;  Surgeon: Isaias Cowman, MD;  Location: [facility] CV LAB;  Service: Cardiovascular;  Laterality: N/A;    Home Medications:  Allergies as of 12/15/2021   No Known Allergies      Medication List        Accurate as of December 15, 2021 11:59 PM. If you have  any questions, ask your nurse or doctor.          STOP taking these medications    vancomycin 125 MG capsule Commonly known as: VANCOCIN Stopped by: Hollice Espy, MD       TAKE these medications    Accu-Chek Aviva Plus test strip Generic drug: glucose blood   Accu-Chek Softclix Lancets lancets once daily Use as instructed.   acetaminophen 500 MG tablet Commonly known as: TYLENOL Take 500 mg by mouth every 8 (eight) hours as needed.   alendronate 70 MG tablet Commonly known as: FOSAMAX TAKE 1 TABLET BY MOUTH EVERY 7DAYS WITH A FULL GLASS OF WATER. DO NOT LIE DOWN FOR 30 MINUTES   Biotin 1000 MCG tablet Take 1,000 mcg by mouth daily.   Cholecalciferol 50 MCG (2000 UT) Caps Take 2,000 Units by mouth daily.   doxepin 25 MG capsule Commonly known as: SINEQUAN Take 25 mg by mouth daily.   feeding supplement Liqd Take 237 mLs by mouth 2 (two) times daily between meals.   Fish Oil 1000 MG Caps Take 1 capsule by mouth.   glipiZIDE 2.5 MG 24 hr tablet Commonly known as: GLUCOTROL XL Take 2.5 mg by mouth daily.   insulin glargine 100 UNIT/ML Solostar Pen Commonly known as: LANTUS Inject 34 Units into the skin daily. (may take up to 50u based upon blood glucose reading)   Insulin Pen Needle 32G X 4 MM Misc USE 1 NEEDLE SUBCUTANEOUSLY ONCE A DAY AS DIRECTED   ketoconazole 2 % shampoo Commonly known as: NIZORAL Apply 1 application topically daily. (apply to scalp and wait at least 30 minutes before rinsing)   losartan 25 MG tablet Commonly known as: COZAAR Take 25 mg by mouth daily.   losartan 50 MG tablet Commonly known as: COZAAR Take 25 mg by mouth daily.   Magnesium 100 MG Caps Take 4 capsules by mouth daily.   metFORMIN 500 MG 24 hr tablet Commonly known as: GLUCOPHAGE-XR Take 500 mg by mouth.   niacin 500 MG CR tablet Commonly known as: NIASPAN Take 500 mg by mouth every evening.   omeprazole 40 MG capsule Commonly known as: PRILOSEC TAKE 1  CAPSULE BY MOUTH EVERY DAY BEFORE BREAKFAST   simvastatin 20 MG tablet Commonly known as: ZOCOR Take 20 mg by mouth at bedtime.   sitaGLIPtin 50 MG tablet Commonly known as: JANUVIA Take 50 mg by mouth daily.   Travatan Z 0.004 % Soln ophthalmic solution Generic drug: Travoprost (BAK Free) Place 1 drop into both eyes at bedtime.   vitamin B-12 1000 MCG tablet Commonly known as: CYANOCOBALAMIN Take 1,000 mcg by mouth daily.        Allergies:  No Known Allergies  Family History: Family History  Problem Relation Age of Onset   Kidney cancer Son    Bladder Cancer Neg Hx     Social History:  reports that she quit smoking about 10 years ago. Her smoking use included cigarettes. She has never used smokeless tobacco. She reports that she does  not drink alcohol and does not use drugs.   Physical Exam: BP (!) 169/67   Pulse 93   Ht 5' 4"  (1.626 m)   Wt 170 lb (77.1 kg)   BMI 29.18 kg/m   Constitutional:  Alert and oriented, No acute distress. HEENT: [place] AT, moist mucus membranes.  Trachea midline, no masses. Cardiovascular: No clubbing, cyanosis, or edema. Respiratory: Normal respiratory effort, no increased work of breathing. Skin: No rashes, bruises or suspicious lesions. Neurologic: Grossly intact, no focal deficits, moving all 4 extremities. Psychiatric: Normal mood and affect.  Laboratory Data: Lab Results  Component Value Date   CREATININE 1.12 (H) 04/26/2021   Lab Results  Component Value Date   HGBA1C 5.3 10/25/2020    Pertinent Imaging: CLINICAL DATA:  Surveillance of left renal mass.   EXAM: RENAL / URINARY TRACT ULTRASOUND COMPLETE   COMPARISON:  CT scan of the abdomen and pelvis October 27, 2020. Renal ultrasound October 14, 2020.   FINDINGS: Right Kidney:   Renal measurements: 10 x 4.2 x 5.6 cm = volume: 124 mL. Increased cortical echogenicity. Fluid-filled mildly prominent right renal pelvis.   Left Kidney:   Renal measurements: 9.5 x 4.4 x  3.6 cm. The known solid renal mass measures 3.2 x 3.3 x 3.2 cm today versus 2.8 x 2.9 x 2.5 cm previously on the October 14, 2020 renal ultrasound. The mass measured 1.9 x 2.2 x 2.1 cm on the October 18, 2020 renal ultrasound. Diffuse increased cortical echogenicity in the left kidney.   Bladder:   Appears normal for degree of bladder distention.   Other:   None.   IMPRESSION: 1. The patient's known solid renal mass, likely a renal cell carcinoma, continues to grow. See above for details. 2. Increased cortical echogenicity in both kidneys consistent with medical renal disease.     Electronically Signed   By: Dorise Bullion III M.D.   On: 12/03/2021 11:08  The above renal ultrasound imaging was personally reviewed and compared to her previous.  Agree with radiologic interpretation.   Assessment & Plan:   Left Renal mass - Slowly enlarging left renal mass now up to 3.2   - The risk of metastasis increases as the size of solid renal mass increases. In general, it is believed that the risk of metastasis for renal masses less than 3-4 cm is small (up to approximately 5%) based mainly on large retrospective studies.  -We had a frank discussion today that although the growth rate is relatively small and the risk of development of interval metastasis relatively low at current size, as the tumor continues to enlarge, minimally invasive options such as percutaneous cryoablation become more difficult and less successful and may require more than 1 procedure to complete.  In the past, she has been more interested in cryoablation then surgical intervention which is reasonable given her age and comorbidities.  -Her preference at this point is to continue surveillance but will shorten the interval and if the tumor in fact continues to grow, she would be open to cryoablation if she is deemed a candidate.  - Will evaluate with CT abdomen with contrast in 6 months   Guy Sandifer Teesha Ohm,acting as a  scribe for Hollice Espy, MD.,have documented all relevant documentation on the behalf of Hollice Espy, MD,as directed by  Hollice Espy, MD while in the presence of Hollice Espy, MD.  I have reviewed the above documentation for accuracy and completeness, and I agree with the above.   Hollice Espy, MD  [facility] [address] [place], [place] [address] [phone]

## 2022-01-01 ENCOUNTER — Inpatient Hospital Stay: Payer: Medicare HMO

## 2022-01-01 ENCOUNTER — Inpatient Hospital Stay: Payer: Medicare HMO | Attending: Oncology

## 2022-01-01 DIAGNOSIS — D500 Iron deficiency anemia secondary to blood loss (chronic): Secondary | ICD-10-CM | POA: Insufficient documentation

## 2022-01-01 DIAGNOSIS — D508 Other iron deficiency anemias: Secondary | ICD-10-CM

## 2022-01-01 DIAGNOSIS — Z87891 Personal history of nicotine dependence: Secondary | ICD-10-CM | POA: Diagnosis not present

## 2022-01-01 DIAGNOSIS — K5521 Angiodysplasia of colon with hemorrhage: Secondary | ICD-10-CM | POA: Diagnosis not present

## 2022-01-01 LAB — CBC WITH DIFFERENTIAL/PLATELET
Abs Immature Granulocytes: 0.02 10*3/uL (ref 0.00–0.07)
Basophils Absolute: 0 10*3/uL (ref 0.0–0.1)
Basophils Relative: 1 %
Eosinophils Absolute: 0.3 10*3/uL (ref 0.0–0.5)
Eosinophils Relative: 6 %
HCT: 35 % — ABNORMAL LOW (ref 36.0–46.0)
Hemoglobin: 11.3 g/dL — ABNORMAL LOW (ref 12.0–15.0)
Immature Granulocytes: 0 %
Lymphocytes Relative: 10 %
Lymphs Abs: 0.6 10*3/uL — ABNORMAL LOW (ref 0.7–4.0)
MCH: 31.2 pg (ref 26.0–34.0)
MCHC: 32.3 g/dL (ref 30.0–36.0)
MCV: 96.7 fL (ref 80.0–100.0)
Monocytes Absolute: 0.4 10*3/uL (ref 0.1–1.0)
Monocytes Relative: 7 %
Neutro Abs: 4.3 10*3/uL (ref 1.7–7.7)
Neutrophils Relative %: 76 %
Platelets: 111 10*3/uL — ABNORMAL LOW (ref 150–400)
RBC: 3.62 MIL/uL — ABNORMAL LOW (ref 3.87–5.11)
RDW: 15.5 % (ref 11.5–15.5)
WBC: 5.7 10*3/uL (ref 4.0–10.5)
nRBC: 0 % (ref 0.0–0.2)

## 2022-01-01 LAB — IRON AND TIBC
Iron: 60 ug/dL (ref 28–170)
Saturation Ratios: 17 % (ref 10.4–31.8)
TIBC: 360 ug/dL (ref 250–450)
UIBC: 300 ug/dL

## 2022-01-01 LAB — FERRITIN: Ferritin: 58 ng/mL (ref 11–307)

## 2022-01-01 MED ORDER — OCTREOTIDE ACETATE 20 MG IM KIT
20.0000 mg | PACK | INTRAMUSCULAR | Status: DC
  Administered 2022-01-01: 20 mg via INTRAMUSCULAR
  Filled 2022-01-01: qty 1

## 2022-02-02 ENCOUNTER — Inpatient Hospital Stay (HOSPITAL_BASED_OUTPATIENT_CLINIC_OR_DEPARTMENT_OTHER): Payer: Medicare HMO | Admitting: Oncology

## 2022-02-02 ENCOUNTER — Other Ambulatory Visit: Payer: Self-pay

## 2022-02-02 ENCOUNTER — Inpatient Hospital Stay: Payer: Medicare HMO

## 2022-02-02 ENCOUNTER — Encounter: Payer: Self-pay | Admitting: Oncology

## 2022-02-02 ENCOUNTER — Inpatient Hospital Stay: Payer: Medicare HMO | Attending: Oncology

## 2022-02-02 VITALS — BP 143/59 | HR 97 | Temp 97.9°F | Resp 18 | Wt 170.6 lb

## 2022-02-02 DIAGNOSIS — D508 Other iron deficiency anemias: Secondary | ICD-10-CM

## 2022-02-02 DIAGNOSIS — D500 Iron deficiency anemia secondary to blood loss (chronic): Secondary | ICD-10-CM | POA: Insufficient documentation

## 2022-02-02 DIAGNOSIS — K5521 Angiodysplasia of colon with hemorrhage: Secondary | ICD-10-CM | POA: Diagnosis not present

## 2022-02-02 DIAGNOSIS — Z79899 Other long term (current) drug therapy: Secondary | ICD-10-CM

## 2022-02-02 DIAGNOSIS — R5383 Other fatigue: Secondary | ICD-10-CM | POA: Diagnosis not present

## 2022-02-02 DIAGNOSIS — Z5181 Encounter for therapeutic drug level monitoring: Secondary | ICD-10-CM | POA: Diagnosis not present

## 2022-02-02 DIAGNOSIS — Z87891 Personal history of nicotine dependence: Secondary | ICD-10-CM | POA: Diagnosis not present

## 2022-02-02 LAB — CBC WITH DIFFERENTIAL/PLATELET
Abs Immature Granulocytes: 0.02 10*3/uL (ref 0.00–0.07)
Basophils Absolute: 0 10*3/uL (ref 0.0–0.1)
Basophils Relative: 1 %
Eosinophils Absolute: 0.2 10*3/uL (ref 0.0–0.5)
Eosinophils Relative: 3 %
HCT: 32.7 % — ABNORMAL LOW (ref 36.0–46.0)
Hemoglobin: 10.2 g/dL — ABNORMAL LOW (ref 12.0–15.0)
Immature Granulocytes: 0 %
Lymphocytes Relative: 10 %
Lymphs Abs: 0.6 10*3/uL — ABNORMAL LOW (ref 0.7–4.0)
MCH: 31.3 pg (ref 26.0–34.0)
MCHC: 31.2 g/dL (ref 30.0–36.0)
MCV: 100.3 fL — ABNORMAL HIGH (ref 80.0–100.0)
Monocytes Absolute: 0.3 10*3/uL (ref 0.1–1.0)
Monocytes Relative: 6 %
Neutro Abs: 4.3 10*3/uL (ref 1.7–7.7)
Neutrophils Relative %: 80 %
Platelets: 137 10*3/uL — ABNORMAL LOW (ref 150–400)
RBC: 3.26 MIL/uL — ABNORMAL LOW (ref 3.87–5.11)
RDW: 15 % (ref 11.5–15.5)
WBC: 5.3 10*3/uL (ref 4.0–10.5)
nRBC: 0 % (ref 0.0–0.2)

## 2022-02-02 LAB — FERRITIN: Ferritin: 44 ng/mL (ref 11–307)

## 2022-02-02 LAB — IRON AND TIBC
Iron: 65 ug/dL (ref 28–170)
Saturation Ratios: 17 % (ref 10.4–31.8)
TIBC: 378 ug/dL (ref 250–450)
UIBC: 313 ug/dL

## 2022-02-02 MED ORDER — ALTEPLASE 2 MG IJ SOLR
2.0000 mg | Freq: Once | INTRAMUSCULAR | Status: DC | PRN
  Filled 2022-02-02: qty 2

## 2022-02-02 MED ORDER — OCTREOTIDE ACETATE 20 MG IM KIT
20.0000 mg | PACK | INTRAMUSCULAR | Status: DC
  Administered 2022-02-02: 20 mg via INTRAMUSCULAR
  Filled 2022-02-02: qty 1

## 2022-02-02 NOTE — Progress Notes (Unsigned)
Hematology/Oncology Consult note [facility]  Telephone:([phone] Fax:([phone]  Patient Care Team: Sofie Hartigan, MD as PCP - General (Family Medicine)   Name of the patient: Emily Faulkner  [id number]  13-Sep-1940   Date of visit: 02/02/22  Diagnosis- iron deficiency anemia possibly secondary to bleeding AVM  Chief complaint/ Reason for visit-routine follow-up of iron deficiency anemia and to receive octreotide  Heme/Onc history: Patient is a 81 year old female with a past medical history significant for stage III CKD, hypertension diabetes and left renal mass who was found to have a hemoglobin of 8.7 at nephrology office and hence referred the patient for anemia.  For a renal mass she has been followed by Dr. Erlene Quan in the masses thought to be slow-growing and conservative management was recommended.  Patient presently reports ongoing fatigue.  Denies any blood loss in her stool or urine.  Denies any dark melanotic stools.  Patient has had a complete GI work-up including endoscopy colonoscopy and small bowel endoscopy in 2019 by Dr. Vicente Males colonoscopy showed multiple bleeding colonic angioectasias treated with APC small bowel endoscopy showed nonbleeding angioectasias in the jejunum treated with APC   Patient found to have significant iron deficiency anemia with a hemoglobin of 6.9 requiring IV iron.  Also seen by Dr. Marius Ditch and underwent push enteroscopy which did not show any evidence of active bleeding but showed chronic atrophic gastritis   Given persistent iron deficiency anemia due to bleeding AVMs patient was also started on monthly octreotide  Interval history-patient reports ongoing fatigue but is otherwise doing well for her age.  No recent hospitalizations  ECOG PS- 2 Pain scale- 0   Review of systems- Review of Systems  Constitutional:  Positive for malaise/fatigue. Negative for chills, fever and weight loss.  HENT:  Negative for  congestion, ear discharge and nosebleeds.   Eyes:  Negative for blurred vision.  Respiratory:  Negative for cough, hemoptysis, sputum production, shortness of breath and wheezing.   Cardiovascular:  Negative for chest pain, palpitations, orthopnea and claudication.  Gastrointestinal:  Negative for abdominal pain, blood in stool, constipation, diarrhea, heartburn, melena, nausea and vomiting.  Genitourinary:  Negative for dysuria, flank pain, frequency, hematuria and urgency.  Musculoskeletal:  Negative for back pain, joint pain and myalgias.  Skin:  Negative for rash.  Neurological:  Negative for dizziness, tingling, focal weakness, seizures, weakness and headaches.  Endo/Heme/Allergies:  Does not bruise/bleed easily.  Psychiatric/Behavioral:  Negative for depression and suicidal ideas. The patient does not have insomnia.       No Known Allergies   Past Medical History:  Diagnosis Date   Acute kidney failure ([place]) 11/16/2020   AKI (acute kidney injury) ([place]) 03/17/2018   Anemia    Cancer (HCC)    breast cancer right mastectomy   Diabetes mellitus without complication ([place])    GIB (gastrointestinal bleeding) 02/20/2018   Glaucoma    Hyperkalemia 10/24/2020   Hyperlipidemia    Hypertension    Neuropathy    Odynophagia 10/24/2020   Osteoarthritis    Rheumatoid arthritis ([place])    Syncope and collapse 10/24/2020   Telangiectasias      Past Surgical History:  Procedure Laterality Date   BREAST SURGERY     right mastectomy   CHOLECYSTECTOMY     COLONOSCOPY     COLONOSCOPY WITH PROPOFOL N/A 08/14/2016   Procedure: COLONOSCOPY WITH PROPOFOL;  Surgeon: Lollie Sails, MD;  Location: [facility] ENDOSCOPY;  Service: Endoscopy;  Laterality: N/A;  COLONOSCOPY WITH PROPOFOL N/A 02/22/2018   Procedure: COLONOSCOPY WITH PROPOFOL;  Surgeon: Jonathon Bellows, MD;  Location: [facility] ENDOSCOPY;  Service: Gastroenterology;  Laterality: N/A;   ENTEROSCOPY N/A 04/12/2021   Procedure: ENTEROSCOPY;  Surgeon:  Lin Landsman, MD;  Location: [facility] ENDOSCOPY;  Service: Gastroenterology;  Laterality: N/A;  push   ESOPHAGOGASTRODUODENOSCOPY (EGD) WITH PROPOFOL N/A 08/14/2016   Procedure: ESOPHAGOGASTRODUODENOSCOPY (EGD) WITH PROPOFOL;  Surgeon: Lollie Sails, MD;  Location: [facility] ENDOSCOPY;  Service: Endoscopy;  Laterality: N/A;   ESOPHAGOGASTRODUODENOSCOPY (EGD) WITH PROPOFOL N/A 01/30/2018   Procedure: ESOPHAGOGASTRODUODENOSCOPY (EGD) WITH PROPOFOL;  Surgeon: Toledo, Benay Pike, MD;  Location: [facility] ENDOSCOPY;  Service: Gastroenterology;  Laterality: N/A;   ESOPHAGOGASTRODUODENOSCOPY (EGD) WITH PROPOFOL N/A 02/22/2018   Procedure: ESOPHAGOGASTRODUODENOSCOPY (EGD) WITH PROPOFOL;  Surgeon: Jonathon Bellows, MD;  Location: [facility] ENDOSCOPY;  Service: Gastroenterology;  Laterality: N/A;   EXCISIONAL HEMORRHOIDECTOMY     KYPHOPLASTY N/A 03/20/2018   Procedure: [id number];  Surgeon: Hessie Knows, MD;  Location: [facility] ORS;  Service: Orthopedics;  Laterality: N/A;   PACEMAKER IMPLANT N/A 10/26/2020   Procedure: PACEMAKER IMPLANT;  Surgeon: Isaias Cowman, MD;  Location: [place] CV LAB;  Service: Cardiovascular;  Laterality: N/A;    Social History   Socioeconomic History   Marital status: Married    Spouse name: Not on file   Number of children: Not on file   Years of education: Not on file   Highest education level: Not on file  Occupational History   Not on file  Tobacco Use   Smoking status: Former    Types: Cigarettes    Quit date: 07/10/2011    Years since quitting: 10.5   Smokeless tobacco: Never  Vaping Use   Vaping Use: Never used  Substance and Sexual Activity   Alcohol use: No   Drug use: No   Sexual activity: Not Currently  Other Topics Concern   Not on file  Social History Narrative   Not on file   Social Determinants of Health   Financial Resource Strain: Not on file  Food Insecurity: Not on file  Transportation Needs: Not on file  Physical Activity: Not on file   Stress: Not on file  Social Connections: Not on file  Intimate Partner Violence: Not on file    Family History  Problem Relation Age of Onset   Kidney cancer Son    Bladder Cancer Neg Hx      Current Outpatient Medications:    ACCU-CHEK AVIVA PLUS test strip, , Disp: , Rfl:    ACCU-CHEK SOFTCLIX LANCETS lancets, once daily Use as instructed., Disp: , Rfl:    acetaminophen (TYLENOL) 500 MG tablet, Take 500 mg by mouth every 8 (eight) hours as needed., Disp: , Rfl:    alendronate (FOSAMAX) 70 MG tablet, TAKE 1 TABLET BY MOUTH EVERY 7DAYS WITH A FULL GLASS OF WATER. DO NOT LIE DOWN FOR 30 MINUTES, Disp: , Rfl:    Biotin 1000 MCG tablet, Take 1,000 mcg by mouth daily., Disp: , Rfl:    Cholecalciferol 50 MCG (2000 UT) CAPS, Take 2,000 Units by mouth daily., Disp: , Rfl:    feeding supplement, ENSURE ENLIVE, (ENSURE ENLIVE) LIQD, Take 237 mLs by mouth 2 (two) times daily between meals., Disp: 237 mL, Rfl: 12   insulin glargine (LANTUS) 100 UNIT/ML Solostar Pen, Inject 34 Units into the skin daily. (may take up to 50u based upon blood glucose reading), Disp: , Rfl:    Insulin Pen Needle 32G X 4 MM MISC, USE  1 NEEDLE SUBCUTANEOUSLY ONCE A DAY AS DIRECTED, Disp: , Rfl:    losartan (COZAAR) 25 MG tablet, Take 25 mg by mouth daily., Disp: , Rfl:    magnesium oxide (MAG-OX) 400 (240 Mg) MG tablet, Take 400 mg by mouth 2 (two) times daily., Disp: , Rfl:    metFORMIN (GLUCOPHAGE-XR) 500 MG 24 hr tablet, Take 500 mg by mouth., Disp: , Rfl:    niacin (NIASPAN) 500 MG CR tablet, Take 500 mg by mouth every evening., Disp: , Rfl: 2   omeprazole (PRILOSEC) 40 MG capsule, TAKE 1 CAPSULE BY MOUTH EVERY DAY BEFORE BREAKFAST, Disp: 90 capsule, Rfl: 1   simvastatin (ZOCOR) 20 MG tablet, Take 20 mg by mouth at bedtime., Disp: , Rfl: 6   sitaGLIPtin (JANUVIA) 50 MG tablet, Take 50 mg by mouth daily., Disp: , Rfl:    TRAVATAN Z 0.004 % SOLN ophthalmic solution, Place 1 drop into both eyes at bedtime., Disp: ,  Rfl:    vitamin B-12 (CYANOCOBALAMIN) 1000 MCG tablet, Take 1,000 mcg by mouth daily., Disp: , Rfl:    doxepin (SINEQUAN) 25 MG capsule, Take 25 mg by mouth daily., Disp: , Rfl:    glipiZIDE (GLUCOTROL XL) 2.5 MG 24 hr tablet, Take 2.5 mg by mouth daily. (Patient not taking: Reported on 02/02/2022), Disp: , Rfl:    ketoconazole (NIZORAL) 2 % shampoo, Apply 1 application topically daily. (apply to scalp and wait at least 30 minutes before rinsing) (Patient not taking: Reported on 02/02/2022), Disp: , Rfl:    losartan (COZAAR) 50 MG tablet, Take 25 mg by mouth daily. (Patient not taking: Reported on 02/02/2022), Disp: , Rfl:    Magnesium 100 MG CAPS, Take 4 capsules by mouth daily. (Patient not taking: Reported on 02/02/2022), Disp: , Rfl:    Omega-3 Fatty Acids (FISH OIL) 1000 MG CAPS, Take 1 capsule by mouth. (Patient not taking: Reported on 02/02/2022), Disp: , Rfl:  No current facility-administered medications for this visit.  Facility-Administered Medications Ordered in Other Visits:    alteplase (CATHFLO ACTIVASE) injection 2 mg, 2 mg, Intracatheter, Once PRN, Grayland Ormond, Kathlene November, MD   octreotide (SANDOSTATIN LAR) IM injection 20 mg, 20 mg, Intramuscular, Q28 days, Sindy Guadeloupe, MD, 20 mg at 11/03/21 1217   octreotide (SANDOSTATIN LAR) IM injection 20 mg, 20 mg, Intramuscular, Q28 days, Sindy Guadeloupe, MD, 20 mg at 02/02/22 1210  Physical exam:  Vitals:   02/02/22 1120  BP: (!) 143/59  Pulse: 97  Resp: 18  Temp: 97.9 F (36.6 C)  SpO2: 96%  Weight: 170 lb 9.6 oz (77.4 kg)   Physical Exam Constitutional:      General: She is not in acute distress. Cardiovascular:     Rate and Rhythm: Normal rate and regular rhythm.     Heart sounds: Normal heart sounds.  Pulmonary:     Effort: Pulmonary effort is normal.     Breath sounds: Normal breath sounds.  Skin:    General: Skin is warm and dry.  Neurological:     Mental Status: She is alert and oriented to person, place, and time.          Latest Ref Rng & Units 04/26/2021   10:00 AM  CMP  Glucose 70 - 99 mg/dL 205   BUN 8 - 23 mg/dL 14   Creatinine 0.44 - 1.00 mg/dL 1.12   Sodium 135 - 145 mmol/L 135   Potassium 3.5 - 5.1 mmol/L 3.7   Chloride 98 - 111 mmol/L  103   CO2 22 - 32 mmol/L 24   Calcium 8.9 - 10.3 mg/dL 9.1   Total Protein 6.5 - 8.1 g/dL 7.1   Total Bilirubin 0.3 - 1.2 mg/dL 1.0   Alkaline Phos 38 - 126 U/L 63   AST 15 - 41 U/L 28   ALT 0 - 44 U/L 14       Latest Ref Rng & Units 02/02/2022   10:59 AM  CBC  WBC 4.0 - 10.5 K/uL 5.3   Hemoglobin 12.0 - 15.0 g/dL 10.2   Hematocrit 36.0 - 46.0 % 32.7   Platelets 150 - 400 K/uL 137    Assessment and plan- Patient is a 80 y.o. female with history of iron deficiency anemia here for routine follow-up  Patient is currently receiving monthly octreotide for her bleeding AVMs and will receive her next dose today.Her hemoglobin is 10.2 today which is close to her baseline of 11.  Iron studies show a ferritin level of 44 with an iron saturation of 17%.  Holding off on any IV iron at this time.  We will repeat CBC ferritin and iron studies B12 folate and TSH in 3 months in 6 months and I will see her back in 6 months.  She will continue getting octreotide injections monthly   Visit Diagnosis 1. Iron deficiency anemia secondary to inadequate dietary iron intake   2. Encounter for monitoring octreotide therapy      Dr. Randa Evens, MD, MPH [facility] [phone] 02/02/2022 4:23 PM

## 2022-02-03 ENCOUNTER — Encounter: Payer: Self-pay | Admitting: Oncology

## 2022-02-05 ENCOUNTER — Other Ambulatory Visit: Payer: Self-pay

## 2022-02-05 NOTE — Patient Outreach (Signed)
[facility]) Care Management  02/05/2022  Emily Faulkner 1941-03-01 [id number]   Telephone Screen    Outreach call to patient to introduce [facility] services and assess care needs as part of benefit of PCP office and insurance plan.   Main healthcare issue/concern today:Patient vocies that she has been dealing with low iron levels for some time. However, she is pleased to report that lab work finally starting to improve. She is working with MD office to get [facility] RN services est up so RN can start administering monthly injections at home instead of her going into the office for injections and lab work.  Health Maintenance/Care Gaps:  A1C level-Per KPN performed on 11/27/21 -value 7.9 DM Eye Exam-pt confirms she has appt next month  Foot Exam-pt admits she does not normally see podiatrist-encouraged to do so and make an appt  Conditions: Per chart review, patient has PMH that includes but not limited to DM, iron deficiency anemia. Eft renal mass, HTN, HLD and CKD.    Medications Reviewed Today     Reviewed by Hayden Pedro, RN (Registered Nurse) on 02/05/22 at 1328  Med List Status: <None>   Medication Order Taking? Sig Documenting Provider Last Dose Status Informant  ACCU-CHEK AVIVA PLUS test strip [id number] No  [provider] Taking Active Self  ACCU-CHEK SOFTCLIX LANCETS lancets [id number] No once daily Use as instructed. [provider] Taking Active Self  acetaminophen (TYLENOL) 500 MG tablet [id number] No Take 500 mg by mouth every 8 (eight) hours as needed. [provider] Taking Active   alendronate (FOSAMAX) 70 MG tablet [id number] No TAKE 1 TABLET BY MOUTH EVERY 7DAYS WITH A FULL GLASS OF WATER. DO NOT LIE DOWN FOR 30 MINUTES [provider] Taking Active   Biotin 1000 MCG tablet [id number] No Take 1,000 mcg by mouth daily. [provider] Taking Active   Cholecalciferol 50 MCG (2000 UT) CAPS [id number] No Take  2,000 Units by mouth daily. [provider] Taking Active   doxepin (SINEQUAN) 25 MG capsule [id number] No Take 25 mg by mouth daily. [provider] Taking Active   feeding supplement, ENSURE ENLIVE, (ENSURE ENLIVE) LIQD [id number] No Take 237 mLs by mouth 2 (two) times daily between meals. Fritzi Mandes, MD Taking Active Self           Med Note Clifton James, Jana Hakim Mar 15, 2021 11:28 AM) Pt only does it when she does not eat as much  glipiZIDE (GLUCOTROL XL) 2.5 MG 24 hr tablet [id number] No Take 2.5 mg by mouth daily.  Patient not taking: Reported on 02/02/2022   [provider] Not Taking Active   insulin glargine (LANTUS) 100 UNIT/ML Solostar Pen [id number] No Inject 34 Units into the skin daily. (may take up to 50u based upon blood glucose reading) [provider] Taking Active Self           Med Note Annett Fabian, KATHERINE   Fri Feb 02, 2022 11:16 AM) PT STATES 30 UNITS.   Insulin Pen Needle 32G X 4 MM MISC [id number] No USE 1 NEEDLE SUBCUTANEOUSLY ONCE A DAY AS DIRECTED [provider] Taking Active Self  ketoconazole (NIZORAL) 2 % shampoo [id number] No Apply 1 application topically daily. (apply to scalp and wait at least 30 minutes before rinsing)  Patient not taking: Reported on 02/02/2022   [provider] Not Taking Active Self  losartan (COZAAR) 25 MG tablet [id number] No Take 25 mg by mouth daily. [provider] Taking Active   losartan (COZAAR) 50 MG tablet [id number] No Take 25 mg by mouth daily.  Patient not taking: Reported on 02/02/2022   [provider] Not Taking Active   Magnesium 100 MG CAPS [id number] No Take 4 capsules by mouth daily.  Patient not taking: Reported on 02/02/2022   [provider] Not Taking Active   magnesium oxide (MAG-OX) 400 (240 Mg) MG tablet [id number] No Take 400 mg by mouth 2 (two) times daily. [provider] Taking Active   metFORMIN (GLUCOPHAGE-XR) 500 MG 24  hr tablet [id number] No Take 500 mg by mouth. [provider] Taking Active Self  niacin (NIASPAN) 500 MG CR tablet [id number] No Take 500 mg by mouth every evening. [provider] Taking Active Self  Omega-3 Fatty Acids (FISH OIL) 1000 MG CAPS [id number] No Take 1 capsule by mouth.  Patient not taking: Reported on 02/02/2022   [provider] Not Taking Active   omeprazole (PRILOSEC) 40 MG capsule [id number] No TAKE 1 CAPSULE BY MOUTH EVERY DAY BEFORE BREAKFAST Vanga, Tally Due, MD Taking Active   simvastatin (ZOCOR) 20 MG tablet [id number] No Take 20 mg by mouth at bedtime. [provider] Taking Active Self  sitaGLIPtin (JANUVIA) 50 MG tablet [id number] No Take 50 mg by mouth daily. [provider] Taking Active Self  TRAVATAN Z 0.004 % SOLN ophthalmic solution [id number] No Place 1 drop into both eyes at bedtime. [provider] Taking Active Self  vitamin B-12 (CYANOCOBALAMIN) 1000 MCG tablet [id number] No Take 1,000 mcg by mouth daily. [provider] Taking Active Self               02/05/2022    1:45 PM  Depression screen PHQ 2/9  Decreased Interest 0  Down, Depressed, Hopeless 0  PHQ - 2 Score 0       11/13/2021    2:11 PM 11/15/2021    3:00 PM 11/21/2021    2:10 PM 11/23/2021    2:04 PM 02/05/2022    1:51 PM  [place] in the past year?     0  Was there an injury with Fall?     0  Fall Risk Category Calculator     0  Fall Risk Category     Low  Patient Fall Risk Level High fall risk High fall risk High fall risk High fall risk Low fall risk  Patient at Risk for Falls Due to     Medication side effect;Impaired balance/gait  Fall risk Follow up     Education provided;Falls evaluation completed    SDOH Screenings   Alcohol Screen: Not on file  Depression (PHQ2-9): Low Risk  (02/05/2022)   Depression (PHQ2-9)    PHQ-2 Score: 0  Financial Resource Strain: Not on file  Food Insecurity: No Food Insecurity  (02/05/2022)   Hunger Vital Sign    Worried About Running Out of Food in the Last Year: Never true    Ran Out of Food in the Last Year: Never true  Housing: Not on file  Physical Activity: Not on file  Social Connections: Not on file  Stress: Not on file  Tobacco Use: Medium Risk (02/05/2022)   Patient History    Smoking Tobacco Use: Former    Smokeless Tobacco Use: Never    Passive Exposure: Not on file  Transportation Needs: No Transportation Needs (02/05/2022)   PRAPARE - Hydrologist (Medical): No  Lack of Transportation (Non-Medical): No    Care Plan : RN Care Manager POC  Updates made by Hayden Pedro, RN since 02/05/2022 12:00 AM     Problem: Chronic Disease Mgmt of Chronic Condition-DM   Priority: High     Long-Range Goal: Development of POC for Mgmt of Chronic Condition-DM   Start Date: 02/05/2022  Expected End Date: 02/06/2023  Priority: High  Note:   Current Barriers:  Chronic Disease Management support and education needs related to DMII   RNCM Clinical Goal(s):  Patient will verbalize understanding of plan for management of DMII as evidenced by mgmt of chronic conditions demonstrate Improved health management independence as evidenced by lowering of A1C  through collaboration with RN Care manager, provider, and care team.   Interventions: 1:1 collaboration with primary care provider regarding development and update of comprehensive plan of care as evidenced by provider attestation and co-signature Inter-disciplinary care team collaboration (see longitudinal plan of care) Evaluation of current treatment plan related to  self management and patient's adherence to plan as established by provider   Diabetes Interventions:  (Status:  New goal.) Long Term Goal Assessed patient's understanding of A1c goal: <7% Reviewed medications with patient and discussed importance of medication adherence Counseled on importance of regular  laboratory monitoring as prescribed Assessed social determinant of health barriers Lab Results  Component Value Date   HGBA1C 5.3 10/25/2020   Patient Goals/Self-Care Activities: Take all medications as prescribed Attend all scheduled provider appointments Call provider office for new concerns or questions  keep appointment with eye doctor check feet daily for cuts, sores or redness Schedule podiatry appt  Follow Up Plan:  Telephone follow up appointment with care management team member scheduled for:  within the month of Oct The patient has been provided with contact information for the care management team and has been advised to call with any health related questions or concerns.        Consent:  [facility] services reviewed and discussed with patient. Verbal consent for services given.  Plan: RN CM discussed with patient next outreach within the month of Oct. Patient agrees to care plan and follow up. RN CM will send barriers letter and route encounter to PCP. RN CM will send welcome letter to patient.  Enzo Montgomery, RN,BSN,CCM [place] Management Telephonic Care Management Coordinator Direct Phone: [phone] Toll Free: [phone] Fax: [phone]

## 2022-02-12 ENCOUNTER — Telehealth: Payer: Self-pay | Admitting: *Deleted

## 2022-02-12 NOTE — Telephone Encounter (Signed)
The pt was here on 7/28 and was asked if she wanted to get otreotide at home through home health that [facility] would set up to go to her how and she and her family member thought it would be a great thing. I called [employer] and said we filled out paper to get it started at home. I spoke to [employer] at [facility] and I faxed it to the fax number for [facility] and they will get it started and set it up for home health to come monthly for the inj. Phone # [phone] and fax # [phone]. The fax went through to [facility].

## 2022-02-13 DIAGNOSIS — H401123 Primary open-angle glaucoma, left eye, severe stage: Secondary | ICD-10-CM | POA: Diagnosis not present

## 2022-02-16 ENCOUNTER — Telehealth: Payer: Self-pay | Admitting: *Deleted

## 2022-02-16 NOTE — Telephone Encounter (Signed)
Got the papers to set up pt to get a octreotide inj. Every 30 days. Form faxed to [phone].the next one is due 8/28

## 2022-02-20 DIAGNOSIS — N2889 Other specified disorders of kidney and ureter: Secondary | ICD-10-CM | POA: Diagnosis not present

## 2022-02-20 DIAGNOSIS — N1832 Chronic kidney disease, stage 3b: Secondary | ICD-10-CM | POA: Diagnosis not present

## 2022-02-20 DIAGNOSIS — E1122 Type 2 diabetes mellitus with diabetic chronic kidney disease: Secondary | ICD-10-CM | POA: Diagnosis not present

## 2022-02-20 DIAGNOSIS — I10 Essential (primary) hypertension: Secondary | ICD-10-CM | POA: Diagnosis not present

## 2022-02-20 DIAGNOSIS — D631 Anemia in chronic kidney disease: Secondary | ICD-10-CM | POA: Diagnosis not present

## 2022-03-05 ENCOUNTER — Inpatient Hospital Stay: Payer: Medicare HMO | Attending: Oncology

## 2022-03-05 DIAGNOSIS — K5521 Angiodysplasia of colon with hemorrhage: Secondary | ICD-10-CM | POA: Insufficient documentation

## 2022-03-05 DIAGNOSIS — R5383 Other fatigue: Secondary | ICD-10-CM | POA: Insufficient documentation

## 2022-03-05 DIAGNOSIS — D508 Other iron deficiency anemias: Secondary | ICD-10-CM

## 2022-03-05 DIAGNOSIS — D500 Iron deficiency anemia secondary to blood loss (chronic): Secondary | ICD-10-CM | POA: Insufficient documentation

## 2022-03-05 DIAGNOSIS — Z87891 Personal history of nicotine dependence: Secondary | ICD-10-CM | POA: Diagnosis not present

## 2022-03-05 MED ORDER — OCTREOTIDE ACETATE 20 MG IM KIT
20.0000 mg | PACK | INTRAMUSCULAR | Status: DC
  Administered 2022-03-05: 20 mg via INTRAMUSCULAR
  Filled 2022-03-05: qty 1

## 2022-03-08 ENCOUNTER — Other Ambulatory Visit: Payer: Self-pay

## 2022-03-08 NOTE — Patient Outreach (Signed)
[facility]) Care Management  03/08/2022  Emily Faulkner 1940/07/25 [id number]   Case Transfer  Pt will be followed for care coordination by [facility] Management. Assigned RN CM will outreach to patient.     Enzo Montgomery, RN,BSN,CCM [place] Management Telephonic Care Management Coordinator Direct Phone: [phone] Toll Free: [phone] Fax: [phone]

## 2022-03-19 DIAGNOSIS — I700 Atherosclerosis of aorta: Secondary | ICD-10-CM | POA: Diagnosis not present

## 2022-03-19 DIAGNOSIS — I10 Essential (primary) hypertension: Secondary | ICD-10-CM | POA: Diagnosis not present

## 2022-03-19 DIAGNOSIS — S32040A Wedge compression fracture of fourth lumbar vertebra, initial encounter for closed fracture: Secondary | ICD-10-CM | POA: Diagnosis not present

## 2022-03-19 DIAGNOSIS — E1140 Type 2 diabetes mellitus with diabetic neuropathy, unspecified: Secondary | ICD-10-CM | POA: Diagnosis not present

## 2022-03-19 DIAGNOSIS — I442 Atrioventricular block, complete: Secondary | ICD-10-CM | POA: Diagnosis not present

## 2022-03-19 DIAGNOSIS — Z1389 Encounter for screening for other disorder: Secondary | ICD-10-CM | POA: Diagnosis not present

## 2022-03-19 DIAGNOSIS — E782 Mixed hyperlipidemia: Secondary | ICD-10-CM | POA: Diagnosis not present

## 2022-03-19 DIAGNOSIS — D508 Other iron deficiency anemias: Secondary | ICD-10-CM | POA: Diagnosis not present

## 2022-03-19 DIAGNOSIS — K7460 Unspecified cirrhosis of liver: Secondary | ICD-10-CM | POA: Diagnosis not present

## 2022-03-19 DIAGNOSIS — Z0000 Encounter for general adult medical examination without abnormal findings: Secondary | ICD-10-CM | POA: Diagnosis not present

## 2022-03-19 DIAGNOSIS — M545 Low back pain, unspecified: Secondary | ICD-10-CM | POA: Diagnosis not present

## 2022-03-19 DIAGNOSIS — I272 Pulmonary hypertension, unspecified: Secondary | ICD-10-CM | POA: Diagnosis not present

## 2022-03-19 DIAGNOSIS — N1832 Chronic kidney disease, stage 3b: Secondary | ICD-10-CM | POA: Diagnosis not present

## 2022-03-21 ENCOUNTER — Other Ambulatory Visit: Payer: Self-pay | Admitting: Gastroenterology

## 2022-03-28 DIAGNOSIS — R001 Bradycardia, unspecified: Secondary | ICD-10-CM | POA: Diagnosis not present

## 2022-04-03 DIAGNOSIS — E1140 Type 2 diabetes mellitus with diabetic neuropathy, unspecified: Secondary | ICD-10-CM | POA: Diagnosis not present

## 2022-04-03 DIAGNOSIS — N1832 Chronic kidney disease, stage 3b: Secondary | ICD-10-CM | POA: Diagnosis not present

## 2022-04-03 DIAGNOSIS — I700 Atherosclerosis of aorta: Secondary | ICD-10-CM | POA: Diagnosis not present

## 2022-04-03 DIAGNOSIS — I272 Pulmonary hypertension, unspecified: Secondary | ICD-10-CM | POA: Diagnosis not present

## 2022-04-03 DIAGNOSIS — M2550 Pain in unspecified joint: Secondary | ICD-10-CM | POA: Diagnosis not present

## 2022-04-03 DIAGNOSIS — I10 Essential (primary) hypertension: Secondary | ICD-10-CM | POA: Diagnosis not present

## 2022-04-03 DIAGNOSIS — K7460 Unspecified cirrhosis of liver: Secondary | ICD-10-CM | POA: Diagnosis not present

## 2022-04-05 ENCOUNTER — Inpatient Hospital Stay: Payer: Medicare HMO | Attending: Oncology

## 2022-05-03 ENCOUNTER — Telehealth: Payer: Self-pay | Admitting: *Deleted

## 2022-05-03 NOTE — Telephone Encounter (Signed)
We received a order for continuation of octreotide for Emily Faulkner at home.  Dr. Janese Banks signed the plan of treatment and was faxed back to [facility].  I asked the person on the phone because I wanted to see if the patient needed more approvals to continue the octreotide.  I was given a telephone number of [phone].  I called that number and got in touch with staff to try to get a prior Auth for the upcoming times.  I went through all the questions gave them the J code and they will let us know in 3 days whether or not they will approve the octreotide.  Jcode 2353.

## 2022-05-07 ENCOUNTER — Inpatient Hospital Stay: Payer: Medicare HMO

## 2022-05-07 ENCOUNTER — Telehealth: Payer: Self-pay | Admitting: *Deleted

## 2022-05-07 ENCOUNTER — Inpatient Hospital Stay: Payer: Medicare HMO | Attending: Oncology

## 2022-05-07 ENCOUNTER — Other Ambulatory Visit: Payer: Self-pay | Admitting: Oncology

## 2022-05-07 ENCOUNTER — Other Ambulatory Visit: Payer: Self-pay | Admitting: *Deleted

## 2022-05-07 VITALS — BP 126/56 | HR 84 | Resp 17

## 2022-05-07 DIAGNOSIS — R5383 Other fatigue: Secondary | ICD-10-CM | POA: Insufficient documentation

## 2022-05-07 DIAGNOSIS — Z87891 Personal history of nicotine dependence: Secondary | ICD-10-CM | POA: Diagnosis not present

## 2022-05-07 DIAGNOSIS — D649 Anemia, unspecified: Secondary | ICD-10-CM

## 2022-05-07 DIAGNOSIS — D508 Other iron deficiency anemias: Secondary | ICD-10-CM

## 2022-05-07 DIAGNOSIS — K5521 Angiodysplasia of colon with hemorrhage: Secondary | ICD-10-CM | POA: Insufficient documentation

## 2022-05-07 DIAGNOSIS — D500 Iron deficiency anemia secondary to blood loss (chronic): Secondary | ICD-10-CM | POA: Diagnosis not present

## 2022-05-07 LAB — CBC WITH DIFFERENTIAL/PLATELET
Abs Immature Granulocytes: 0.03 10*3/uL (ref 0.00–0.07)
Basophils Absolute: 0 10*3/uL (ref 0.0–0.1)
Basophils Relative: 1 %
Eosinophils Absolute: 0.2 10*3/uL (ref 0.0–0.5)
Eosinophils Relative: 4 %
HCT: 21.6 % — ABNORMAL LOW (ref 36.0–46.0)
Hemoglobin: 6 g/dL — ABNORMAL LOW (ref 12.0–15.0)
Immature Granulocytes: 1 %
Lymphocytes Relative: 12 %
Lymphs Abs: 0.6 10*3/uL — ABNORMAL LOW (ref 0.7–4.0)
MCH: 24.4 pg — ABNORMAL LOW (ref 26.0–34.0)
MCHC: 27.8 g/dL — ABNORMAL LOW (ref 30.0–36.0)
MCV: 87.8 fL (ref 80.0–100.0)
Monocytes Absolute: 0.3 10*3/uL (ref 0.1–1.0)
Monocytes Relative: 7 %
Neutro Abs: 3.6 10*3/uL (ref 1.7–7.7)
Neutrophils Relative %: 75 %
Platelets: 161 10*3/uL (ref 150–400)
RBC: 2.46 MIL/uL — ABNORMAL LOW (ref 3.87–5.11)
RDW: 15.9 % — ABNORMAL HIGH (ref 11.5–15.5)
WBC: 4.7 10*3/uL (ref 4.0–10.5)
nRBC: 0 % (ref 0.0–0.2)

## 2022-05-07 LAB — VITAMIN B12: Vitamin B-12: 1215 pg/mL — ABNORMAL HIGH (ref 180–914)

## 2022-05-07 LAB — IRON AND TIBC
Iron: 17 ug/dL — ABNORMAL LOW (ref 28–170)
Saturation Ratios: 4 % — ABNORMAL LOW (ref 10.4–31.8)
TIBC: 461 ug/dL — ABNORMAL HIGH (ref 250–450)
UIBC: 443 ug/dL

## 2022-05-07 LAB — PREPARE RBC (CROSSMATCH)

## 2022-05-07 LAB — TSH: TSH: 3.293 u[IU]/mL (ref 0.350–4.500)

## 2022-05-07 LAB — FERRITIN: Ferritin: 6 ng/mL — ABNORMAL LOW (ref 11–307)

## 2022-05-07 LAB — FOLATE: Folate: 23 ng/mL (ref >5.9)

## 2022-05-07 MED ORDER — OCTREOTIDE ACETATE 20 MG IM KIT
20.0000 mg | PACK | INTRAMUSCULAR | Status: DC
  Administered 2022-05-07: 20 mg via INTRAMUSCULAR
  Filled 2022-05-07: qty 1

## 2022-05-07 NOTE — Telephone Encounter (Signed)
Called the first son on list and got him on phone and spoke to him about her hgb low and she needs blood tranfusion. Then I was told by one of the sons of pt and they are still here. Then I went down and spoke to them and they will have her here 11/1 at 9 am and pt will be here

## 2022-05-07 NOTE — Progress Notes (Signed)
Patient here for ocetoide has concerns regarding injection at home. Team notified, asked to reach out to patient at home regarding issue. Checked labs Hgb 6. MD notified ordered type & screen/ hold tube. Patient informed MD advices blood transfusion tomorrow 10/31. Patient on the way down to lab. Asks if we can call regarding transfusion apt. Refuses to wait for team to get scheduled, states wheelchair is uncomfortable. Team/scheduler aware and notified.

## 2022-05-09 ENCOUNTER — Inpatient Hospital Stay: Payer: Medicare HMO | Attending: Oncology

## 2022-05-09 DIAGNOSIS — D649 Anemia, unspecified: Secondary | ICD-10-CM

## 2022-05-09 DIAGNOSIS — R5383 Other fatigue: Secondary | ICD-10-CM | POA: Insufficient documentation

## 2022-05-09 DIAGNOSIS — D500 Iron deficiency anemia secondary to blood loss (chronic): Secondary | ICD-10-CM | POA: Diagnosis not present

## 2022-05-09 DIAGNOSIS — K5521 Angiodysplasia of colon with hemorrhage: Secondary | ICD-10-CM | POA: Diagnosis not present

## 2022-05-09 DIAGNOSIS — Z87891 Personal history of nicotine dependence: Secondary | ICD-10-CM | POA: Diagnosis not present

## 2022-05-09 MED ORDER — SODIUM CHLORIDE 0.9% FLUSH
10.0000 mL | INTRAVENOUS | Status: DC | PRN
  Filled 2022-05-09: qty 10

## 2022-05-09 MED ORDER — SODIUM CHLORIDE 0.9% IV SOLUTION
250.0000 mL | Freq: Once | INTRAVENOUS | Status: AC
  Administered 2022-05-09: 250 mL via INTRAVENOUS
  Filled 2022-05-09: qty 250

## 2022-05-09 MED ORDER — ACETAMINOPHEN 325 MG PO TABS
650.0000 mg | ORAL_TABLET | Freq: Once | ORAL | Status: AC
  Administered 2022-05-09: 650 mg via ORAL
  Filled 2022-05-09: qty 2

## 2022-05-09 NOTE — Patient Instructions (Signed)

## 2022-05-10 DIAGNOSIS — S32040G Wedge compression fracture of fourth lumbar vertebra, subsequent encounter for fracture with delayed healing: Secondary | ICD-10-CM | POA: Diagnosis not present

## 2022-05-10 LAB — BPAM RBC
Blood Product Expiration Date: 202311262359
ISSUE DATE / TIME: 202311010941
Unit Type and Rh: 5100

## 2022-05-10 LAB — TYPE AND SCREEN
ABO/RH(D): O POS
Antibody Screen: NEGATIVE
Unit division: 0

## 2022-05-10 MED FILL — Iron Sucrose Inj 20 MG/ML (Fe Equiv): INTRAVENOUS | Qty: 10 | Status: AC

## 2022-05-11 ENCOUNTER — Other Ambulatory Visit: Payer: Self-pay | Admitting: Orthopedic Surgery

## 2022-05-11 ENCOUNTER — Inpatient Hospital Stay: Payer: Medicare HMO

## 2022-05-11 DIAGNOSIS — K5521 Angiodysplasia of colon with hemorrhage: Secondary | ICD-10-CM | POA: Diagnosis not present

## 2022-05-11 DIAGNOSIS — Z87891 Personal history of nicotine dependence: Secondary | ICD-10-CM | POA: Diagnosis not present

## 2022-05-11 DIAGNOSIS — D500 Iron deficiency anemia secondary to blood loss (chronic): Secondary | ICD-10-CM | POA: Diagnosis not present

## 2022-05-11 DIAGNOSIS — R5383 Other fatigue: Secondary | ICD-10-CM | POA: Diagnosis not present

## 2022-05-11 DIAGNOSIS — D508 Other iron deficiency anemias: Secondary | ICD-10-CM

## 2022-05-11 DIAGNOSIS — S32040G Wedge compression fracture of fourth lumbar vertebra, subsequent encounter for fracture with delayed healing: Secondary | ICD-10-CM

## 2022-05-11 MED ORDER — SODIUM CHLORIDE 0.9 % IV SOLN
200.0000 mg | Freq: Once | INTRAVENOUS | Status: AC
  Administered 2022-05-11: 200 mg via INTRAVENOUS
  Filled 2022-05-11: qty 200

## 2022-05-11 MED ORDER — SODIUM CHLORIDE 0.9 % IV SOLN
INTRAVENOUS | Status: DC
  Filled 2022-05-11 (×3): qty 250

## 2022-05-16 ENCOUNTER — Encounter
Admission: RE | Admit: 2022-05-16 | Discharge: 2022-05-16 | Disposition: A | Discharge status: Home / Self Care | Payer: Medicare HMO | Source: Ambulatory Visit | Attending: Orthopedic Surgery | Admitting: Orthopedic Surgery

## 2022-05-16 ENCOUNTER — Telehealth: Payer: Self-pay | Admitting: *Deleted

## 2022-05-16 DIAGNOSIS — S32040G Wedge compression fracture of fourth lumbar vertebra, subsequent encounter for fracture with delayed healing: Secondary | ICD-10-CM | POA: Insufficient documentation

## 2022-05-16 DIAGNOSIS — S32049A Unspecified fracture of fourth lumbar vertebra, initial encounter for closed fracture: Secondary | ICD-10-CM | POA: Diagnosis not present

## 2022-05-16 MED ORDER — TECHNETIUM TC 99M MEDRONATE IV KIT
22.8500 | PACK | Freq: Once | INTRAVENOUS | Status: AC | PRN
  Administered 2022-05-16: 22.85 via INTRAVENOUS

## 2022-05-16 NOTE — Patient Outreach (Signed)
  Care Coordination   05/16/2022 Name: Emily Faulkner MRN: [id number] DOB: [birth year]   Care Coordination Outreach Attempts:  An unsuccessful telephone outreach was attempted today to offer the patient information about available care coordination services as a benefit of their health plan.   Follow Up Plan:  Additional outreach attempts will be made to offer the patient care coordination information and services.   Encounter Outcome:  No Answer  Care Coordination Interventions Activated:  Yes   Care Coordination Interventions:  No, not indicated    [place] Management [phone]

## 2022-05-17 ENCOUNTER — Inpatient Hospital Stay: Payer: Medicare HMO

## 2022-05-17 VITALS — BP 127/56 | HR 82 | Temp 98.2°F | Resp 16

## 2022-05-17 DIAGNOSIS — K5521 Angiodysplasia of colon with hemorrhage: Secondary | ICD-10-CM | POA: Diagnosis not present

## 2022-05-17 DIAGNOSIS — D508 Other iron deficiency anemias: Secondary | ICD-10-CM

## 2022-05-17 DIAGNOSIS — R5383 Other fatigue: Secondary | ICD-10-CM | POA: Diagnosis not present

## 2022-05-17 DIAGNOSIS — Z87891 Personal history of nicotine dependence: Secondary | ICD-10-CM | POA: Diagnosis not present

## 2022-05-17 DIAGNOSIS — D500 Iron deficiency anemia secondary to blood loss (chronic): Secondary | ICD-10-CM | POA: Diagnosis not present

## 2022-05-17 MED ORDER — SODIUM CHLORIDE 0.9 % IV SOLN
200.0000 mg | Freq: Once | INTRAVENOUS | Status: AC
  Administered 2022-05-17: 200 mg via INTRAVENOUS
  Filled 2022-05-17: qty 200

## 2022-05-17 MED ORDER — SODIUM CHLORIDE 0.9 % IV SOLN
INTRAVENOUS | Status: DC
  Filled 2022-05-17: qty 250

## 2022-05-23 MED ORDER — TECHNETIUM TC 99M MEDRONATE IV KIT
20.0000 | PACK | Freq: Once | INTRAVENOUS | Status: AC | PRN
  Administered 2022-05-23: 20.26 via INTRAVENOUS

## 2022-05-25 ENCOUNTER — Inpatient Hospital Stay: Payer: Medicare HMO

## 2022-05-25 VITALS — BP 141/47 | HR 78 | Resp 16

## 2022-05-25 DIAGNOSIS — R5383 Other fatigue: Secondary | ICD-10-CM | POA: Diagnosis not present

## 2022-05-25 DIAGNOSIS — Z87891 Personal history of nicotine dependence: Secondary | ICD-10-CM | POA: Diagnosis not present

## 2022-05-25 DIAGNOSIS — K5521 Angiodysplasia of colon with hemorrhage: Secondary | ICD-10-CM | POA: Diagnosis not present

## 2022-05-25 DIAGNOSIS — D508 Other iron deficiency anemias: Secondary | ICD-10-CM

## 2022-05-25 DIAGNOSIS — D500 Iron deficiency anemia secondary to blood loss (chronic): Secondary | ICD-10-CM | POA: Diagnosis not present

## 2022-05-25 MED ORDER — SODIUM CHLORIDE 0.9 % IV SOLN
INTRAVENOUS | Status: DC
  Filled 2022-05-25: qty 250

## 2022-05-25 MED ORDER — SODIUM CHLORIDE 0.9 % IV SOLN
200.0000 mg | Freq: Once | INTRAVENOUS | Status: AC
  Administered 2022-05-25: 200 mg via INTRAVENOUS
  Filled 2022-05-25: qty 200

## 2022-05-25 NOTE — Progress Notes (Signed)
Pt tolerated treatment without complaints.  Pt observed 30 minutes post treatment.  VSS.

## 2022-05-25 NOTE — Patient Instructions (Signed)

## 2022-05-30 ENCOUNTER — Other Ambulatory Visit: Payer: Self-pay | Admitting: Gastroenterology

## 2022-05-31 DIAGNOSIS — D508 Other iron deficiency anemias: Secondary | ICD-10-CM | POA: Diagnosis not present

## 2022-06-04 DIAGNOSIS — E785 Hyperlipidemia, unspecified: Secondary | ICD-10-CM | POA: Diagnosis not present

## 2022-06-04 DIAGNOSIS — Z794 Long term (current) use of insulin: Secondary | ICD-10-CM | POA: Diagnosis not present

## 2022-06-04 DIAGNOSIS — E1142 Type 2 diabetes mellitus with diabetic polyneuropathy: Secondary | ICD-10-CM | POA: Diagnosis not present

## 2022-06-04 DIAGNOSIS — E1169 Type 2 diabetes mellitus with other specified complication: Secondary | ICD-10-CM | POA: Diagnosis not present

## 2022-06-04 DIAGNOSIS — M810 Age-related osteoporosis without current pathological fracture: Secondary | ICD-10-CM | POA: Diagnosis not present

## 2022-06-06 ENCOUNTER — Inpatient Hospital Stay: Payer: Medicare HMO

## 2022-06-07 ENCOUNTER — Ambulatory Visit
Admission: RE | Admit: 2022-06-07 | Discharge: 2022-06-07 | Disposition: A | Discharge status: Home / Self Care | Payer: Medicare HMO | Source: Ambulatory Visit | Attending: Urology | Admitting: Urology

## 2022-06-07 DIAGNOSIS — N2889 Other specified disorders of kidney and ureter: Secondary | ICD-10-CM | POA: Insufficient documentation

## 2022-06-07 DIAGNOSIS — M8008XA Age-related osteoporosis with current pathological fracture, vertebra(e), initial encounter for fracture: Secondary | ICD-10-CM | POA: Diagnosis not present

## 2022-06-07 DIAGNOSIS — K7460 Unspecified cirrhosis of liver: Secondary | ICD-10-CM | POA: Diagnosis not present

## 2022-06-07 DIAGNOSIS — S22080A Wedge compression fracture of T11-T12 vertebra, initial encounter for closed fracture: Secondary | ICD-10-CM | POA: Diagnosis not present

## 2022-06-07 DIAGNOSIS — S32040A Wedge compression fracture of fourth lumbar vertebra, initial encounter for closed fracture: Secondary | ICD-10-CM | POA: Diagnosis not present

## 2022-06-07 DIAGNOSIS — Z853 Personal history of malignant neoplasm of breast: Secondary | ICD-10-CM | POA: Diagnosis not present

## 2022-06-07 LAB — POCT I-STAT CREATININE: Creatinine, Ser: 1.3 mg/dL — ABNORMAL HIGH (ref 0.44–1.00)

## 2022-06-07 MED ORDER — IOHEXOL 300 MG/ML  SOLN
100.0000 mL | Freq: Once | INTRAMUSCULAR | Status: AC | PRN
  Administered 2022-06-07: 100 mL via INTRAVENOUS

## 2022-06-08 ENCOUNTER — Telehealth: Payer: Self-pay | Admitting: Oncology

## 2022-06-08 ENCOUNTER — Inpatient Hospital Stay: Payer: Medicare HMO | Attending: Oncology

## 2022-06-08 VITALS — BP 124/45 | HR 69 | Temp 98.1°F | Resp 16

## 2022-06-08 DIAGNOSIS — D508 Other iron deficiency anemias: Secondary | ICD-10-CM

## 2022-06-08 DIAGNOSIS — R5383 Other fatigue: Secondary | ICD-10-CM | POA: Diagnosis not present

## 2022-06-08 DIAGNOSIS — D500 Iron deficiency anemia secondary to blood loss (chronic): Secondary | ICD-10-CM | POA: Diagnosis not present

## 2022-06-08 DIAGNOSIS — K5521 Angiodysplasia of colon with hemorrhage: Secondary | ICD-10-CM | POA: Diagnosis not present

## 2022-06-08 DIAGNOSIS — D649 Anemia, unspecified: Secondary | ICD-10-CM

## 2022-06-08 DIAGNOSIS — Z87891 Personal history of nicotine dependence: Secondary | ICD-10-CM | POA: Insufficient documentation

## 2022-06-08 MED ORDER — SODIUM CHLORIDE 0.9 % IV SOLN
Freq: Once | INTRAVENOUS | Status: AC
  Filled 2022-06-08: qty 250

## 2022-06-08 MED ORDER — SODIUM CHLORIDE 0.9 % IV SOLN
200.0000 mg | Freq: Once | INTRAVENOUS | Status: AC
  Administered 2022-06-08: 200 mg via INTRAVENOUS
  Filled 2022-06-08: qty 200

## 2022-06-08 NOTE — Telephone Encounter (Signed)
Patient is having a back surgery on Wednesday at [facility] and son is asking if she needs to have iron on Friday or do we need to r/s?

## 2022-06-08 NOTE — Telephone Encounter (Signed)
Reschedule by 3 weeks

## 2022-06-15 ENCOUNTER — Inpatient Hospital Stay: Payer: Medicare HMO

## 2022-06-19 ENCOUNTER — Inpatient Hospital Stay: Payer: Medicare HMO

## 2022-06-19 VITALS — BP 130/51 | HR 73 | Temp 97.0°F | Resp 16

## 2022-06-19 DIAGNOSIS — Z87891 Personal history of nicotine dependence: Secondary | ICD-10-CM | POA: Diagnosis not present

## 2022-06-19 DIAGNOSIS — K5521 Angiodysplasia of colon with hemorrhage: Secondary | ICD-10-CM | POA: Diagnosis not present

## 2022-06-19 DIAGNOSIS — D500 Iron deficiency anemia secondary to blood loss (chronic): Secondary | ICD-10-CM | POA: Diagnosis not present

## 2022-06-19 DIAGNOSIS — D508 Other iron deficiency anemias: Secondary | ICD-10-CM

## 2022-06-19 DIAGNOSIS — R5383 Other fatigue: Secondary | ICD-10-CM | POA: Diagnosis not present

## 2022-06-19 MED ORDER — SODIUM CHLORIDE 0.9 % IV SOLN
INTRAVENOUS | Status: DC
  Filled 2022-06-19: qty 250

## 2022-06-19 MED ORDER — SODIUM CHLORIDE 0.9 % IV SOLN
200.0000 mg | Freq: Once | INTRAVENOUS | Status: AC
  Administered 2022-06-19: 200 mg via INTRAVENOUS
  Filled 2022-06-19: qty 200

## 2022-06-19 NOTE — Patient Instructions (Signed)

## 2022-06-19 NOTE — Progress Notes (Signed)
Pt refused 30 minute post observation.  Waited 15 minutes, pt had no complaints.  VSS.

## 2022-06-20 DIAGNOSIS — Z23 Encounter for immunization: Secondary | ICD-10-CM | POA: Diagnosis not present

## 2022-06-20 DIAGNOSIS — I152 Hypertension secondary to endocrine disorders: Secondary | ICD-10-CM | POA: Diagnosis not present

## 2022-06-20 DIAGNOSIS — I2510 Atherosclerotic heart disease of native coronary artery without angina pectoris: Secondary | ICD-10-CM | POA: Diagnosis not present

## 2022-06-20 DIAGNOSIS — I442 Atrioventricular block, complete: Secondary | ICD-10-CM | POA: Diagnosis not present

## 2022-06-20 DIAGNOSIS — E1159 Type 2 diabetes mellitus with other circulatory complications: Secondary | ICD-10-CM | POA: Diagnosis not present

## 2022-06-22 ENCOUNTER — Ambulatory Visit (INDEPENDENT_AMBULATORY_CARE_PROVIDER_SITE_OTHER): Payer: Medicare HMO | Admitting: Urology

## 2022-06-22 ENCOUNTER — Encounter: Payer: Self-pay | Admitting: Urology

## 2022-06-22 VITALS — BP 129/59 | HR 80 | Ht 64.0 in | Wt 165.0 lb

## 2022-06-22 DIAGNOSIS — N2889 Other specified disorders of kidney and ureter: Secondary | ICD-10-CM

## 2022-06-22 NOTE — Progress Notes (Signed)
I, DeAsia L Maxie,acting as a scribe for Vanna Scotland, MD.,have documented all relevant documentation on the behalf of Vanna Scotland, MD,as directed by  Vanna Scotland, MD while in the presence of Vanna Scotland, MD.   06/22/22 2:56 PM   Emily Faulkner 1940/08/03 [id number]  Referring provider: Marina Goodell, MD [address] [place],  [place] [address]  Chief Complaint  Patient presents with   Results    CT Scan    HPI: 81 year-old female with a personal history of renal mass which were fallen conservatively, initially diagnosed in 2018 which at that time was 1.7 cm, here for 6 month interval imaging.   CT from 06/07/22 shows continued enlargement, now measuring 3.6 x 3.3 x 2.9 cm anterior interpolar left renal cortical mass (series 7/image 64), contained by Gerota's fascia, increased from 2.7 x 2.0 cm on 10/27/2020 CT using similar measurement technique.  She has a compressed fracture, awaiting Kyphoplasty on 06/29/22 at [facility].  She is accompanied today by her son.   PMH: Past Medical History:  Diagnosis Date   Acute kidney failure (HCC) 11/16/2020   AKI (acute kidney injury) (HCC) 03/17/2018   Anemia    Cancer (HCC)    breast cancer right mastectomy   Diabetes mellitus without complication (HCC)    GIB (gastrointestinal bleeding) 02/20/2018   Glaucoma    Hyperkalemia 10/24/2020   Hyperlipidemia    Hypertension    Neuropathy    Odynophagia 10/24/2020   Osteoarthritis    Rheumatoid arthritis (HCC)    Syncope and collapse 10/24/2020   Telangiectasias     Surgical History: Past Surgical History:  Procedure Laterality Date   BREAST SURGERY     right mastectomy   CHOLECYSTECTOMY     COLONOSCOPY     COLONOSCOPY WITH PROPOFOL N/A 08/14/2016   Procedure: COLONOSCOPY WITH PROPOFOL;  Surgeon: Christena Deem, MD;  Location: [facility] ENDOSCOPY;  Service: Endoscopy;  Laterality: N/A;   COLONOSCOPY WITH PROPOFOL N/A 02/22/2018   Procedure: COLONOSCOPY WITH PROPOFOL;   Surgeon: Wyline Mood, MD;  Location: [facility] ENDOSCOPY;  Service: Gastroenterology;  Laterality: N/A;   ENTEROSCOPY N/A 04/12/2021   Procedure: ENTEROSCOPY;  Surgeon: Toney Reil, MD;  Location: [facility] ENDOSCOPY;  Service: Gastroenterology;  Laterality: N/A;  push   ESOPHAGOGASTRODUODENOSCOPY (EGD) WITH PROPOFOL N/A 08/14/2016   Procedure: ESOPHAGOGASTRODUODENOSCOPY (EGD) WITH PROPOFOL;  Surgeon: Christena Deem, MD;  Location: [facility] ENDOSCOPY;  Service: Endoscopy;  Laterality: N/A;   ESOPHAGOGASTRODUODENOSCOPY (EGD) WITH PROPOFOL N/A 01/30/2018   Procedure: ESOPHAGOGASTRODUODENOSCOPY (EGD) WITH PROPOFOL;  Surgeon: Toledo, Boykin Nearing, MD;  Location: [facility] ENDOSCOPY;  Service: Gastroenterology;  Laterality: N/A;   ESOPHAGOGASTRODUODENOSCOPY (EGD) WITH PROPOFOL N/A 02/22/2018   Procedure: ESOPHAGOGASTRODUODENOSCOPY (EGD) WITH PROPOFOL;  Surgeon: Wyline Mood, MD;  Location: [facility] ENDOSCOPY;  Service: Gastroenterology;  Laterality: N/A;   EXCISIONAL HEMORRHOIDECTOMY     KYPHOPLASTY N/A 03/20/2018   Procedure: [id number];  Surgeon: Kennedy Bucker, MD;  Location: [facility] ORS;  Service: Orthopedics;  Laterality: N/A;   PACEMAKER IMPLANT N/A 10/26/2020   Procedure: PACEMAKER IMPLANT;  Surgeon: Marcina Millard, MD;  Location: [facility] INVASIVE CV LAB;  Service: Cardiovascular;  Laterality: N/A;    Home Medications:  Allergies as of 06/22/2022   No Known Allergies      Medication List        Accurate as of June 22, 2022  2:56 PM. If you have any questions, ask your nurse or doctor.          STOP taking these  medications    Fish Oil 1000 MG Caps Stopped by: Vanna Scotland, MD   glipiZIDE 2.5 MG 24 hr tablet Commonly known as: GLUCOTROL XL Stopped by: Vanna Scotland, MD   ketoconazole 2 % shampoo Commonly known as: NIZORAL Stopped by: Vanna Scotland, MD       TAKE these medications    Accu-Chek Aviva Plus test strip Generic drug: glucose blood   Accu-Chek Softclix Lancets  lancets once daily Use as instructed.   acetaminophen 500 MG tablet Commonly known as: TYLENOL Take 500 mg by mouth every 8 (eight) hours as needed.   alendronate 70 MG tablet Commonly known as: FOSAMAX TAKE 1 TABLET BY MOUTH EVERY 7DAYS WITH A FULL GLASS OF WATER. DO NOT LIE DOWN FOR 30 MINUTES   Biotin 1000 MCG tablet Take 1,000 mcg by mouth daily.   Cholecalciferol 50 MCG (2000 UT) Caps Take by mouth 2 (two) times daily. What changed: Another medication with the same name was removed. Continue taking this medication, and follow the directions you see here. Changed by: Vanna Scotland, MD   cyanocobalamin 1000 MCG tablet Commonly known as: VITAMIN B12 Take 1,000 mcg by mouth daily.   doxepin 25 MG capsule Commonly known as: SINEQUAN Take 25 mg by mouth daily.   feeding supplement Liqd Take 237 mLs by mouth 2 (two) times daily between meals.   insulin glargine 100 UNIT/ML Solostar Pen Commonly known as: LANTUS Inject 34 Units into the skin daily. (may take up to 50u based upon blood glucose reading)   Insulin Pen Needle 32G X 4 MM Misc USE 1 NEEDLE SUBCUTANEOUSLY ONCE A DAY AS DIRECTED   losartan 25 MG tablet Commonly known as: COZAAR Take 1 tablet by mouth daily. What changed: Another medication with the same name was removed. Continue taking this medication, and follow the directions you see here. Changed by: Vanna Scotland, MD   Magnesium 100 MG Caps Take 2 capsules by mouth daily.   magnesium oxide 400 (240 Mg) MG tablet Commonly known as: MAG-OX Take 400 mg by mouth 2 (two) times daily.   metFORMIN 500 MG 24 hr tablet Commonly known as: GLUCOPHAGE-XR Take 500 mg by mouth.   niacin 500 MG CR capsule Take 500 mg by mouth at bedtime.   niacin 500 MG ER tablet Commonly known as: VITAMIN B3 Take 500 mg by mouth every evening.   octreotide 10 MG injection Commonly known as: SANDOSTATIN LAR Inject 10 mg into the muscle every 28 (twenty-eight) days.  Inject 10 mg into the muscle every 28 (twenty-eight) days   omeprazole 40 MG capsule Commonly known as: PRILOSEC TAKE 1 CAPSULE BY MOUTH EVERY DAY BEFORE BREAKFAST   Oyster Shell Calcium w/D 500-5 MG-MCG Tabs Take 1 tablet by mouth daily.   simvastatin 20 MG tablet Commonly known as: ZOCOR Take 20 mg by mouth at bedtime.   sitaGLIPtin 50 MG tablet Commonly known as: JANUVIA Take 50 mg by mouth daily.   Travatan Z 0.004 % Soln ophthalmic solution Generic drug: Travoprost (BAK Free) Place 1 drop into both eyes at bedtime.        Family History: Family History  Problem Relation Age of Onset   Kidney cancer Son    Bladder Cancer Neg Hx     Social History:  reports that she quit smoking about 10 years ago. Her smoking use included cigarettes. She has never used smokeless tobacco. She reports that she does not drink alcohol and does not use drugs.  Physical Exam: BP (!) 129/59 (BP Location: Left Arm, Patient Position: Sitting, Cuff Size: Normal)   Pulse 80   Ht 5\' 4"  (1.626 m)   Wt 165 lb (74.8 kg)   BMI 28.32 kg/m   Constitutional:  Alert and oriented, No acute distress.  In wheelchair but appears more spry than on previous occasions. HEENT: [place] AT, moist mucus membranes.  Trachea midline, no masses. Neurologic: Grossly intact, no focal deficits, moving all 4 extremities. Psychiatric: Normal mood and affect.  Laboratory Data: Lab Results  Component Value Date   WBC 4.7 05/07/2022   HGB 6.0 (L) 05/07/2022   HCT 21.6 (L) 05/07/2022   MCV 87.8 05/07/2022   PLT 161 05/07/2022    Lab Results  Component Value Date   CREATININE 1.30 (H) 06/07/2022    Pertinent Imaging:  EXAM: CT ABDOMEN WITH CONTRAST   TECHNIQUE: Multidetector CT imaging of the abdomen was performed using the standard protocol following bolus administration of intravenous contrast.   RADIATION DOSE REDUCTION: This exam was performed according to the departmental dose-optimization program  which includes automated exposure control, adjustment of the mA and/or kV according to patient size and/or use of iterative reconstruction technique.   CONTRAST:  OMNIPAQUE IOHEXOL 300 MG/ML  SOLN   COMPARISON:  10/27/2020 CT abdomen/pelvis.   FINDINGS: Lower chest: Tiny solid 0.3 cm medial basilar right lower lobe pulmonary nodule (series 6/image 13), unchanged. No acute abnormality at the lung bases. Pacemaker lead terminates in right ventricular apex. Coronary atherosclerosis. Moderate gastroesophageal varices.   Hepatobiliary: Diffusely markedly irregular liver surface compatible with cirrhosis. No liver masses. Cholecystectomy. Bile ducts are stable and within normal post cholecystectomy limits with CBD diameter 6 mm.   Pancreas: Normal, with no mass or duct dilation.   Spleen: Top normal size spleen. Craniocaudal splenic length 12.6 cm. No splenic mass.   Adrenals/Urinary Tract: Normal adrenals. No hydronephrosis. Avidly enhancing solid 3.6 x 3.3 x 2.9 cm anterior interpolar left renal cortical mass (series 7/image 64), contained by Gerota's fascia, increased from 2.7 x 2.0 cm on 10/27/2020 CT using similar measurement technique. No right renal masses.   Stomach/Bowel: Normal non-distended stomach. Visualized small and large bowel is normal caliber, with no bowel wall thickening.   Vascular/Lymphatic: Atherosclerotic nonaneurysmal abdominal aorta. Patent portal, splenic, hepatic and renal veins. No renal vein tumor thrombus. Large paraumbilical varices. No pathologically enlarged lymph nodes in the abdomen.   Other: No pneumoperitoneum, ascites or focal fluid collection.   Musculoskeletal: No aggressive appearing focal osseous lesions. Partially visualized right mastectomy. Marked lumbar spondylosis. Chronic moderate T12 vertebral compression fracture status post vertebroplasty. Moderate superior L4 vertebral compression fracture is new since 10/27/2020 CT,  reported on 03/19/2022 lumbar spine radiographs.   IMPRESSION: 1. Avidly enhancing solid 3.6 x 3.3 x 2.9 cm anterior interpolar left renal cortical mass, increased in size since 10/27/2020 CT, compatible with clear cell renal cell carcinoma. 2. No evidence of metastatic disease in the abdomen. 3. Cirrhosis. No liver masses. Moderate gastroesophageal and large paraumbilical varices, compatible with portal hypertension. 4. Coronary atherosclerosis. 5.  Aortic Atherosclerosis (ICD10-I70.0).     Electronically Signed   By: Delbert Phenix M.D.   On: 06/07/2022 20:45  Personally reviewed the above CT scan and compared to previous.  There is in fact interval growth solid enhancing lesion.  No evidence of metastatic or local invasion.   Assessment & Plan:    Left renal Mass - Slowing enlarging left renal mass now up to 3.6 centimeters  -  Given steadily enlarging tumor, now slightly greater than 3 cm, based on growth rate, I do think that this is a reasonable lesion to treat.  She also appears clinically more well than on previous occasions.  In the past, she Meaux strongly considered percutaneous intervention and is open to this today.  - She is wanting to proceed with cryoablation. We reviewed risks, benefits, recovery in detail. Referral sent to interventional radiology for consultation.     [facility] [address] [place], [place] [address] [phone]  I have reviewed the above documentation for accuracy and completeness, and I agree with the above.   Vanna Scotland, MD  I spent 36 total minutes on the day of the encounter including pre-visit review of the medical record, face-to-face time with the patient, and post visit ordering of labs/imaging/tests.

## 2022-06-29 DIAGNOSIS — Z79899 Other long term (current) drug therapy: Secondary | ICD-10-CM | POA: Diagnosis not present

## 2022-06-29 DIAGNOSIS — E119 Type 2 diabetes mellitus without complications: Secondary | ICD-10-CM | POA: Diagnosis not present

## 2022-06-29 DIAGNOSIS — N1330 Unspecified hydronephrosis: Secondary | ICD-10-CM | POA: Diagnosis not present

## 2022-06-29 DIAGNOSIS — R9431 Abnormal electrocardiogram [ECG] [EKG]: Secondary | ICD-10-CM | POA: Diagnosis not present

## 2022-06-29 DIAGNOSIS — S32049A Unspecified fracture of fourth lumbar vertebra, initial encounter for closed fracture: Secondary | ICD-10-CM | POA: Diagnosis not present

## 2022-06-29 DIAGNOSIS — M8008XA Age-related osteoporosis with current pathological fracture, vertebra(e), initial encounter for fracture: Secondary | ICD-10-CM | POA: Diagnosis not present

## 2022-06-29 DIAGNOSIS — E7800 Pure hypercholesterolemia, unspecified: Secondary | ICD-10-CM | POA: Diagnosis not present

## 2022-06-29 DIAGNOSIS — I10 Essential (primary) hypertension: Secondary | ICD-10-CM | POA: Diagnosis not present

## 2022-07-01 DIAGNOSIS — M8008XA Age-related osteoporosis with current pathological fracture, vertebra(e), initial encounter for fracture: Secondary | ICD-10-CM | POA: Diagnosis not present

## 2022-07-01 DIAGNOSIS — S32040A Wedge compression fracture of fourth lumbar vertebra, initial encounter for closed fracture: Secondary | ICD-10-CM | POA: Diagnosis not present

## 2022-07-06 ENCOUNTER — Inpatient Hospital Stay: Payer: Medicare HMO

## 2022-07-06 DIAGNOSIS — D508 Other iron deficiency anemias: Secondary | ICD-10-CM | POA: Diagnosis not present

## 2022-07-12 DIAGNOSIS — D508 Other iron deficiency anemias: Secondary | ICD-10-CM | POA: Diagnosis not present

## 2022-07-23 ENCOUNTER — Telehealth: Payer: Self-pay | Admitting: Urology

## 2022-07-23 NOTE — Telephone Encounter (Signed)
Referral was put in for IR department. Sending to [place] to review.

## 2022-07-23 NOTE — Telephone Encounter (Signed)
Pt LMOM that she was supposed to let Dr Erlene Quan know if she hadn't got a call by mid-month to have the growth on her left kidney froze off.

## 2022-07-24 NOTE — Telephone Encounter (Signed)
Notified Vickie and she has called and left a message for patient to return her call.

## 2022-07-26 NOTE — Telephone Encounter (Signed)
Patient is scheduled on 08/07/22.

## 2022-08-02 DIAGNOSIS — M8008XA Age-related osteoporosis with current pathological fracture, vertebra(e), initial encounter for fracture: Secondary | ICD-10-CM | POA: Diagnosis not present

## 2022-08-06 ENCOUNTER — Inpatient Hospital Stay: Payer: Medicare HMO | Attending: Oncology

## 2022-08-06 ENCOUNTER — Inpatient Hospital Stay: Payer: Medicare HMO

## 2022-08-06 ENCOUNTER — Inpatient Hospital Stay (HOSPITAL_BASED_OUTPATIENT_CLINIC_OR_DEPARTMENT_OTHER): Payer: Medicare HMO | Admitting: Oncology

## 2022-08-06 ENCOUNTER — Encounter: Payer: Self-pay | Admitting: Oncology

## 2022-08-06 VITALS — BP 138/50 | HR 82 | Resp 18 | Ht 64.0 in | Wt 163.0 lb

## 2022-08-06 DIAGNOSIS — Z87891 Personal history of nicotine dependence: Secondary | ICD-10-CM | POA: Insufficient documentation

## 2022-08-06 DIAGNOSIS — D508 Other iron deficiency anemias: Secondary | ICD-10-CM

## 2022-08-06 DIAGNOSIS — D649 Anemia, unspecified: Secondary | ICD-10-CM

## 2022-08-06 DIAGNOSIS — K5521 Angiodysplasia of colon with hemorrhage: Secondary | ICD-10-CM | POA: Insufficient documentation

## 2022-08-06 DIAGNOSIS — R5383 Other fatigue: Secondary | ICD-10-CM | POA: Insufficient documentation

## 2022-08-06 DIAGNOSIS — D500 Iron deficiency anemia secondary to blood loss (chronic): Secondary | ICD-10-CM | POA: Insufficient documentation

## 2022-08-06 LAB — IRON AND TIBC
Iron: 31 ug/dL (ref 28–170)
Saturation Ratios: 10 % — ABNORMAL LOW (ref 10.4–31.8)
TIBC: 316 ug/dL (ref 250–450)
UIBC: 285 ug/dL

## 2022-08-06 LAB — CBC WITH DIFFERENTIAL/PLATELET
Abs Immature Granulocytes: 0.01 10*3/uL (ref 0.00–0.07)
Basophils Absolute: 0 10*3/uL (ref 0.0–0.1)
Basophils Relative: 1 %
Eosinophils Absolute: 0.2 10*3/uL (ref 0.0–0.5)
Eosinophils Relative: 4 %
HCT: 30.8 % — ABNORMAL LOW (ref 36.0–46.0)
Hemoglobin: 9.7 g/dL — ABNORMAL LOW (ref 12.0–15.0)
Immature Granulocytes: 0 %
Lymphocytes Relative: 18 %
Lymphs Abs: 0.8 10*3/uL (ref 0.7–4.0)
MCH: 29.5 pg (ref 26.0–34.0)
MCHC: 31.5 g/dL (ref 30.0–36.0)
MCV: 93.6 fL (ref 80.0–100.0)
Monocytes Absolute: 0.3 10*3/uL (ref 0.1–1.0)
Monocytes Relative: 7 %
Neutro Abs: 2.9 10*3/uL (ref 1.7–7.7)
Neutrophils Relative %: 70 %
Platelets: 126 10*3/uL — ABNORMAL LOW (ref 150–400)
RBC: 3.29 MIL/uL — ABNORMAL LOW (ref 3.87–5.11)
RDW: 16.5 % — ABNORMAL HIGH (ref 11.5–15.5)
WBC: 4.2 10*3/uL (ref 4.0–10.5)
nRBC: 0 % (ref 0.0–0.2)

## 2022-08-06 LAB — FERRITIN: Ferritin: 35 ng/mL (ref 11–307)

## 2022-08-07 ENCOUNTER — Ambulatory Visit
Admission: RE | Admit: 2022-08-07 | Discharge: 2022-08-07 | Disposition: A | Discharge status: Home / Self Care | Payer: Medicare HMO | Source: Ambulatory Visit | Attending: Urology | Admitting: Urology

## 2022-08-07 DIAGNOSIS — N2889 Other specified disorders of kidney and ureter: Secondary | ICD-10-CM

## 2022-08-07 HISTORY — PX: IR RADIOLOGIST EVAL & MGMT: IMG5224

## 2022-08-07 NOTE — H&P (Signed)
Interventional Radiology - Clinic Visit, Initial H&P    Referring Provider: Hollice Espy, MD  Reason for Visit: Left renal mass     History of Present Illness  Emily Faulkner is a 82 y.o. female with a relevant past medical history of insulin-dependent DM, HTN, HLD, cirrhosis, and pacemaker placement seen today in Interventional Radiology clinic for enlarging left renal mass.  Patient has been followed by Dr. Erlene Quan for her left renal mass, which has showed slow interval enlargement now measuring up to 3.6 cm on most recent CT scan in Nov 2023, previously measuring 2.7 cm in April 2022.   She reports relevant PMHx as described. She reports having last seen her GI doctor within the last year for her cirrhosis. She denies any hematemesis, although does report occasional small volume blood in the stool. She reports she is up to date on her colonoscopy. She denies ever having ascites/paracentesis. Her son denies any issues with confusion/hepatic encephalopathy.   She denies any cardiopulmonary complications with previous anesthesia.    Additional Past Medical History Past Medical History:  Diagnosis Date   Acute kidney failure ([place]) 11/16/2020   AKI (acute kidney injury) ([place]) 03/17/2018   Anemia    Cancer (HCC)    breast cancer right mastectomy   Diabetes mellitus without complication ([place])    GIB (gastrointestinal bleeding) 02/20/2018   Glaucoma    Hyperkalemia 10/24/2020   Hyperlipidemia    Hypertension    Neuropathy    Odynophagia 10/24/2020   Osteoarthritis    Rheumatoid arthritis ([place])    Syncope and collapse 10/24/2020   Telangiectasias      Surgical History  Past Surgical History:  Procedure Laterality Date   BREAST SURGERY     right mastectomy   CHOLECYSTECTOMY     COLONOSCOPY     COLONOSCOPY WITH PROPOFOL N/A 08/14/2016   Procedure: COLONOSCOPY WITH PROPOFOL;  Surgeon: Lollie Sails, MD;  Location: [facility] ENDOSCOPY;  Service: Endoscopy;  Laterality: N/A;    COLONOSCOPY WITH PROPOFOL N/A 02/22/2018   Procedure: COLONOSCOPY WITH PROPOFOL;  Surgeon: Jonathon Bellows, MD;  Location: [facility] ENDOSCOPY;  Service: Gastroenterology;  Laterality: N/A;   ENTEROSCOPY N/A 04/12/2021   Procedure: ENTEROSCOPY;  Surgeon: Lin Landsman, MD;  Location: [facility] ENDOSCOPY;  Service: Gastroenterology;  Laterality: N/A;  push   ESOPHAGOGASTRODUODENOSCOPY (EGD) WITH PROPOFOL N/A 08/14/2016   Procedure: ESOPHAGOGASTRODUODENOSCOPY (EGD) WITH PROPOFOL;  Surgeon: Lollie Sails, MD;  Location: [facility] ENDOSCOPY;  Service: Endoscopy;  Laterality: N/A;   ESOPHAGOGASTRODUODENOSCOPY (EGD) WITH PROPOFOL N/A 01/30/2018   Procedure: ESOPHAGOGASTRODUODENOSCOPY (EGD) WITH PROPOFOL;  Surgeon: Toledo, Benay Pike, MD;  Location: [facility] ENDOSCOPY;  Service: Gastroenterology;  Laterality: N/A;   ESOPHAGOGASTRODUODENOSCOPY (EGD) WITH PROPOFOL N/A 02/22/2018   Procedure: ESOPHAGOGASTRODUODENOSCOPY (EGD) WITH PROPOFOL;  Surgeon: Jonathon Bellows, MD;  Location: [facility] ENDOSCOPY;  Service: Gastroenterology;  Laterality: N/A;   EXCISIONAL HEMORRHOIDECTOMY     KYPHOPLASTY N/A 03/20/2018   Procedure: [id number];  Surgeon: Hessie Knows, MD;  Location: [facility] ORS;  Service: Orthopedics;  Laterality: N/A;   PACEMAKER IMPLANT N/A 10/26/2020   Procedure: PACEMAKER IMPLANT;  Surgeon: Isaias Cowman, MD;  Location: [place] CV LAB;  Service: Cardiovascular;  Laterality: N/A;     Medications  I have reviewed the current medication list. Refer to chart for details. Current Outpatient Medications  Medication Instructions   ACCU-CHEK AVIVA PLUS test strip No dose, route, or frequency recorded.   ACCU-CHEK SOFTCLIX LANCETS lancets once daily Use as instructed.   acetaminophen (TYLENOL) 500 mg, Oral,  Every 8 hours PRN   alendronate (FOSAMAX) 70 MG tablet TAKE 1 TABLET BY MOUTH EVERY 7DAYS WITH A FULL GLASS OF WATER. DO NOT LIE DOWN FOR 30 MINUTES   Biotin 1,000 mcg, Oral, Daily   Calcium Carb-Cholecalciferol  (OYSTER SHELL CALCIUM W/D) 500-5 MG-MCG TABS 1 tablet, Oral, Daily   Cholecalciferol 50 MCG (2000 UT) CAPS Oral, 2 times daily, 39mg   cyanocobalamin (VITAMIN B12) 1,000 mcg, Oral, Daily   doxepin (SINEQUAN) 25 mg, Oral, Daily   feeding supplement, ENSURE ENLIVE, (ENSURE ENLIVE) LIQD 237 mLs, Oral, 2 times daily between meals   insulin glargine (LANTUS) 34 Units, Subcutaneous, Daily, (may take up to 50u based upon blood glucose reading)   Insulin Pen Needle 32G X 4 MM MISC USE 1 NEEDLE SUBCUTANEOUSLY ONCE A DAY AS DIRECTED   losartan (COZAAR) 25 MG tablet 1 tablet, Oral, Daily   losartan (COZAAR) 50 MG tablet Oral   Magnesium 100 MG CAPS 2 capsules, Oral, Daily   magnesium oxide (MAG-OX) 400 mg, Oral, 2 times daily   metFORMIN (GLUCOPHAGE-XR) 500 mg, Oral   niacin (VITAMIN B3) 500 mg, Oral, Every evening   niacin 500 mg, Oral, Daily at bedtime   octreotide (SANDOSTATIN LAR) 10 mg, Intramuscular, Every 28 days, Inject 10 mg into the muscle every 28 (twenty-eight) days   omeprazole (PRILOSEC) 40 MG capsule TAKE 1 CAPSULE BY MOUTH EVERY DAY BEFORE BREAKFAST   simvastatin (ZOCOR) 20 mg, Oral, Daily at bedtime   sitaGLIPtin (JANUVIA) 50 mg, Oral, Daily   TRAVATAN Z 0.004 % SOLN ophthalmic solution 1 drop, Both Eyes, Daily at bedtime      Allergies No Known Allergies Does patient have contrast allergy: No     Physical Exam Current Vitals Temp: 98.5 F (36.9 C) (Temp Source: Oral)  Pulse Rate: 85     BP: (!) 142/62  SpO2: 95 %        There is no height or weight on file to calculate BMI.  General: Alert and answers questions appropriately. No apparent distress. HEENT: Normocephalic, atraumatic. Conjunctivae normal without scleral icterus. Cardiac: Regular rate.  Pulmonary: Normal work of breathing. On room air. Abdominal: Soft without distension.   Pertinent Lab Results    Latest Ref Rng & Units 08/06/2022    1:36 PM 05/07/2022   11:18 AM 02/02/2022   10:59 AM  CBC  WBC  4.0 - 10.5 K/uL 4.2  4.7  5.3   Hemoglobin 12.0 - 15.0 g/dL 9.7  6.0  10.2   Hematocrit 36.0 - 46.0 % 30.8  21.6  32.7   Platelets 150 - 400 K/uL 126  161  137       Latest Ref Rng & Units 06/07/2022    2:00 PM 04/26/2021   10:00 AM 03/01/2021   11:17 AM  CMP  Glucose 70 - 99 mg/dL  205  167   BUN 8 - 23 mg/dL  14  20   Creatinine 0.44 - 1.00 mg/dL 1.30  1.12  1.29   Sodium 135 - 145 mmol/L  135  137   Potassium 3.5 - 5.1 mmol/L  3.7  4.1   Chloride 98 - 111 mmol/L  103  106   CO2 22 - 32 mmol/L  24  23   Calcium 8.9 - 10.3 mg/dL  9.1  8.9   Total Protein 6.5 - 8.1 g/dL  7.1  7.1   Total Bilirubin 0.3 - 1.2 mg/dL  1.0  0.8   Alkaline Phos 38 -  126 U/L  63  59   AST 15 - 41 U/L  28  27   ALT 0 - 44 U/L  14  13       Relevant and/or Recent Imaging: CT Nov 2023  IMPRESSION: 1. Avidly enhancing solid 3.6 x 3.3 x 2.9 cm anterior interpolar left renal cortical mass, increased in size since 10/27/2020 CT, compatible with clear cell renal cell carcinoma. 2. No evidence of metastatic disease in the abdomen. 3. Cirrhosis. No liver masses. Moderate gastroesophageal and large paraumbilical varices, compatible with portal hypertension. 4. Coronary atherosclerosis. 5. Aortic Atherosclerosis (ICD10-I70.0). Electronically Signed By: Ilona Sorrel M.D. On: 06/07/2022 20:45    Assessment & Plan Emily Faulkner is a 82 y.o. female who was referred to [facility] by Dr. Erlene Quan in consultation for further evaluation and management of enlarging left renal mass.  Treatment options include surgery versus percutaneous ablation vs continuing to watch.  These were discussed with the patient, and her son. They are most interested in percutaneous ablation at this time. We discussed the details of the procedure and its risks and benefits to the patient, who voiced his/her understanding. The risks of percutaneous intervention include bleeding, infection, injury to adjacent structures, and urine  leak/urinoma. She understands the benefits and risks, and is agreeable to proceed.    Plan for CT guided MWA of left renal mass.    Pre-procedure planning:  Post-procedure disposition: [facility], overnight observation  Sedation plan: general anesthesia Medication holds: per Anes   Positioning/access site: CT prone MWA   Other: Dr. Laurence Ferrari to assist     Total time spent on today's visit was over 74 Minutes, including both face-to-face time and non face-to-face time, personally spent on review of chart (including labs and relevant imaging), discussing further workup and treatment options, referral to specialist if needed, reviewing outside records if pertinent, answering patient questions, and coordinating care regarding enlarging left renal mass as well as management strategy.      Albin Felling, MD  Vascular and Interventional Radiology 08/07/2022 3:11 PM

## 2022-08-08 ENCOUNTER — Encounter (HOSPITAL_COMMUNITY): Payer: Self-pay

## 2022-08-08 ENCOUNTER — Other Ambulatory Visit (HOSPITAL_COMMUNITY): Payer: Self-pay | Admitting: Interventional Radiology

## 2022-08-08 DIAGNOSIS — N2889 Other specified disorders of kidney and ureter: Secondary | ICD-10-CM

## 2022-08-09 ENCOUNTER — Encounter: Payer: Self-pay | Admitting: Vascular Surgery

## 2022-08-09 DIAGNOSIS — D508 Other iron deficiency anemias: Secondary | ICD-10-CM | POA: Diagnosis not present

## 2022-08-11 ENCOUNTER — Encounter: Payer: Self-pay | Admitting: Oncology

## 2022-08-11 NOTE — Progress Notes (Signed)
Hematology/Oncology Consult note [facility]  [phone] [phone]  Patient Care Team: Sofie Hartigan, MD as PCP - General (Family Medicine)   Name of the patient: Emily Faulkner  [id number]  December 22, 1940   Date of visit: 08/11/22  Diagnosis- iron deficiency anemia secondary to bleeding AVM  Chief complaint/ Reason for visit-in follow-up of iron deficiency anemia currently on octreotide and IV iron  Heme/Onc history: Patient is a 82 year old female with a past medical history significant for stage III CKD, hypertension diabetes and left renal mass who was found to have a hemoglobin of 8.7 at nephrology office and hence referred the patient for anemia.  For a renal mass she has been followed by Dr. Erlene Quan in the masses thought to be slow-growing and conservative management was recommended.  Patient presently reports ongoing fatigue.  Denies any blood loss in her stool or urine.  Denies any dark melanotic stools.  Patient has had a complete GI work-up including endoscopy colonoscopy and small bowel endoscopy in 2019 by Dr. Vicente Males colonoscopy showed multiple bleeding colonic angioectasias treated with APC small bowel endoscopy showed nonbleeding angioectasias in the jejunum treated with APC   Patient found to have significant iron deficiency anemia with a hemoglobin of 6.9 requiring IV iron.  Also seen by Dr. Marius Ditch and underwent push enteroscopy which did not show any evidence of active bleeding but showed chronic atrophic gastritis   Given persistent iron deficiency anemia due to bleeding AVMs patient was also started on monthly octreotide  Interval history-patient is receiving octreotide injections at home and tolerating it well without any significant side effects.  She is doing well for her age and she has not had any recent hospitalizations.  Denies any blood loss in her stool or urine.  ECOG PS- 2 Pain scale- 0  Review of systems- Review  of Systems  Constitutional:  Positive for malaise/fatigue. Negative for chills, fever and weight loss.  HENT:  Negative for congestion, ear discharge and nosebleeds.   Eyes:  Negative for blurred vision.  Respiratory:  Negative for cough, hemoptysis, sputum production, shortness of breath and wheezing.   Cardiovascular:  Negative for chest pain, palpitations, orthopnea and claudication.  Gastrointestinal:  Negative for abdominal pain, blood in stool, constipation, diarrhea, heartburn, melena, nausea and vomiting.  Genitourinary:  Negative for dysuria, flank pain, frequency, hematuria and urgency.  Musculoskeletal:  Negative for back pain, joint pain and myalgias.  Skin:  Negative for rash.  Neurological:  Negative for dizziness, tingling, focal weakness, seizures, weakness and headaches.  Endo/Heme/Allergies:  Does not bruise/bleed easily.  Psychiatric/Behavioral:  Negative for depression and suicidal ideas. The patient does not have insomnia.       No Known Allergies   Past Medical History:  Diagnosis Date   Acute kidney failure ([place]) 11/16/2020   AKI (acute kidney injury) ([place]) 03/17/2018   Anemia    Cancer (HCC)    breast cancer right mastectomy   Diabetes mellitus without complication ([place])    GIB (gastrointestinal bleeding) 02/20/2018   Glaucoma    Hyperkalemia 10/24/2020   Hyperlipidemia    Hypertension    Neuropathy    Odynophagia 10/24/2020   Osteoarthritis    Rheumatoid arthritis ([place])    Syncope and collapse 10/24/2020   Telangiectasias      Past Surgical History:  Procedure Laterality Date   BREAST SURGERY     right mastectomy   CHOLECYSTECTOMY     COLONOSCOPY     COLONOSCOPY WITH  PROPOFOL N/A 08/14/2016   Procedure: COLONOSCOPY WITH PROPOFOL;  Surgeon: Lollie Sails, MD;  Location: [facility] ENDOSCOPY;  Service: Endoscopy;  Laterality: N/A;   COLONOSCOPY WITH PROPOFOL N/A 02/22/2018   Procedure: COLONOSCOPY WITH PROPOFOL;  Surgeon: Jonathon Bellows, MD;  Location: [facility]  ENDOSCOPY;  Service: Gastroenterology;  Laterality: N/A;   ENTEROSCOPY N/A 04/12/2021   Procedure: ENTEROSCOPY;  Surgeon: Lin Landsman, MD;  Location: [facility] ENDOSCOPY;  Service: Gastroenterology;  Laterality: N/A;  push   ESOPHAGOGASTRODUODENOSCOPY (EGD) WITH PROPOFOL N/A 08/14/2016   Procedure: ESOPHAGOGASTRODUODENOSCOPY (EGD) WITH PROPOFOL;  Surgeon: Lollie Sails, MD;  Location: [facility] ENDOSCOPY;  Service: Endoscopy;  Laterality: N/A;   ESOPHAGOGASTRODUODENOSCOPY (EGD) WITH PROPOFOL N/A 01/30/2018   Procedure: ESOPHAGOGASTRODUODENOSCOPY (EGD) WITH PROPOFOL;  Surgeon: Toledo, Benay Pike, MD;  Location: [facility] ENDOSCOPY;  Service: Gastroenterology;  Laterality: N/A;   ESOPHAGOGASTRODUODENOSCOPY (EGD) WITH PROPOFOL N/A 02/22/2018   Procedure: ESOPHAGOGASTRODUODENOSCOPY (EGD) WITH PROPOFOL;  Surgeon: Jonathon Bellows, MD;  Location: [facility] ENDOSCOPY;  Service: Gastroenterology;  Laterality: N/A;   EXCISIONAL HEMORRHOIDECTOMY     IR RADIOLOGIST EVAL & MGMT  08/07/2022   KYPHOPLASTY N/A 03/20/2018   Procedure: [id number];  Surgeon: Hessie Knows, MD;  Location: [facility] ORS;  Service: Orthopedics;  Laterality: N/A;   PACEMAKER IMPLANT N/A 10/26/2020   Procedure: PACEMAKER IMPLANT;  Surgeon: Isaias Cowman, MD;  Location: [place] CV LAB;  Service: Cardiovascular;  Laterality: N/A;    Social History   Socioeconomic History   Marital status: Married    Spouse name: Not on file   Number of children: Not on file   Years of education: Not on file   Highest education level: Not on file  Occupational History   Not on file  Tobacco Use   Smoking status: Former    Types: Cigarettes    Quit date: 07/10/2011    Years since quitting: 11.0   Smokeless tobacco: Never  Vaping Use   Vaping Use: Never used  Substance and Sexual Activity   Alcohol use: No   Drug use: No   Sexual activity: Not Currently  Other Topics Concern   Not on file  Social History Narrative   Not on file   Social  Determinants of Health   Financial Resource Strain: Not on file  Food Insecurity: No Food Insecurity (02/05/2022)   Hunger Vital Sign    Worried About Running Out of Food in the Last Year: Never true    Ran Out of Food in the Last Year: Never true  Transportation Needs: No Transportation Needs (02/05/2022)   PRAPARE - Hydrologist (Medical): No    Lack of Transportation (Non-Medical): No  Physical Activity: Not on file  Stress: Not on file  Social Connections: Not on file  Intimate Partner Violence: Not on file    Family History  Problem Relation Age of Onset   Kidney cancer Son    Bladder Cancer Neg Hx      Current Outpatient Medications:    ACCU-CHEK AVIVA PLUS test strip, , Disp: , Rfl:    ACCU-CHEK SOFTCLIX LANCETS lancets, once daily Use as instructed., Disp: , Rfl:    acetaminophen (TYLENOL) 500 MG tablet, Take 500 mg by mouth every 8 (eight) hours as needed., Disp: , Rfl:    alendronate (FOSAMAX) 70 MG tablet, TAKE 1 TABLET BY MOUTH EVERY 7DAYS WITH A FULL GLASS OF WATER. DO NOT LIE DOWN FOR 30 MINUTES, Disp: , Rfl:    Biotin 1000 MCG tablet, Take 1,000  mcg by mouth daily., Disp: , Rfl:    Calcium Carb-Cholecalciferol (OYSTER SHELL CALCIUM W/D) 500-5 MG-MCG TABS, Take 1 tablet by mouth daily., Disp: , Rfl:    Cholecalciferol 50 MCG (2000 UT) CAPS, Take by mouth 2 (two) times daily. 22mg, Disp: , Rfl:    doxepin (SINEQUAN) 25 MG capsule, Take 25 mg by mouth daily., Disp: , Rfl:    insulin glargine (LANTUS) 100 UNIT/ML Solostar Pen, Inject 34 Units into the skin daily. (may take up to 50u based upon blood glucose reading), Disp: , Rfl:    Insulin Pen Needle 32G X 4 MM MISC, USE 1 NEEDLE SUBCUTANEOUSLY ONCE A DAY AS DIRECTED, Disp: , Rfl:    losartan (COZAAR) 25 MG tablet, Take 1 tablet by mouth daily., Disp: , Rfl:    losartan (COZAAR) 50 MG tablet, Take by mouth., Disp: , Rfl:    Magnesium 100 MG CAPS, Take 2 capsules by mouth daily., Disp: , Rfl:     magnesium oxide (MAG-OX) 400 (240 Mg) MG tablet, Take 400 mg by mouth 2 (two) times daily., Disp: , Rfl:    metFORMIN (GLUCOPHAGE-XR) 500 MG 24 hr tablet, Take 500 mg by mouth., Disp: , Rfl:    niacin (NIASPAN) 500 MG CR tablet, Take 500 mg by mouth every evening., Disp: , Rfl: 2   niacin 500 MG CR capsule, Take 500 mg by mouth at bedtime., Disp: , Rfl:    octreotide (SANDOSTATIN LAR) 10 MG injection, Inject 10 mg into the muscle every 28 (twenty-eight) days. Inject 10 mg into the muscle every 28 (twenty-eight) days, Disp: , Rfl:    omeprazole (PRILOSEC) 40 MG capsule, TAKE 1 CAPSULE BY MOUTH EVERY DAY BEFORE BREAKFAST, Disp: 90 capsule, Rfl: 0   simvastatin (ZOCOR) 20 MG tablet, Take 20 mg by mouth at bedtime., Disp: , Rfl: 6   sitaGLIPtin (JANUVIA) 50 MG tablet, Take 50 mg by mouth daily., Disp: , Rfl:    TRAVATAN Z 0.004 % SOLN ophthalmic solution, Place 1 drop into both eyes at bedtime., Disp: , Rfl:    vitamin B-12 (CYANOCOBALAMIN) 1000 MCG tablet, Take 1,000 mcg by mouth daily., Disp: , Rfl:    feeding supplement, ENSURE ENLIVE, (ENSURE ENLIVE) LIQD, Take 237 mLs by mouth 2 (two) times daily between meals. (Patient not taking: Reported on 08/06/2022), Disp: 237 mL, Rfl: 12 No current facility-administered medications for this visit.  Facility-Administered Medications Ordered in Other Visits:    0.9 %  sodium chloride infusion, , Intravenous, Continuous, RSindy Guadeloupe MD, Last Rate: 10 mL/hr at 05/11/22 1347, New Bag at 05/11/22 1347   alteplase (CATHFLO ACTIVASE) injection 2 mg, 2 mg, Intracatheter, Once PRN, FGrayland Ormond TKathlene November MD   octreotide (SANDOSTATIN LAR) IM injection 20 mg, 20 mg, Intramuscular, Q28 days, RSindy Guadeloupe MD, 20 mg at 11/03/21 1217   octreotide (SANDOSTATIN LAR) IM injection 20 mg, 20 mg, Intramuscular, Q28 days, RSindy Guadeloupe MD, 20 mg at 02/02/22 1210  Physical exam:  Vitals:   08/06/22 1359 08/06/22 1403  BP:  (!) 138/50  Pulse:  82  Resp:  18  SpO2:   97%  Weight: 163 lb (73.9 kg)   Height: '5\' 4"'$  (1.626 m)    Physical Exam Constitutional:      Comments: Thin elderly female sitting in a wheelchair.  Appears in no acute distress  Eyes:     Pupils: Pupils are equal, round, and reactive to light.  Cardiovascular:     Rate and Rhythm: Normal  rate and regular rhythm.     Heart sounds: Normal heart sounds.  Pulmonary:     Effort: Pulmonary effort is normal.     Breath sounds: Normal breath sounds.  Abdominal:     General: Bowel sounds are normal.     Palpations: Abdomen is soft.  Skin:    General: Skin is warm and dry.  Neurological:     Mental Status: She is alert and oriented to person, place, and time.         Latest Ref Rng & Units 06/07/2022    2:00 PM  CMP  Creatinine 0.44 - 1.00 mg/dL 1.30       Latest Ref Rng & Units 08/06/2022    1:36 PM  CBC  WBC 4.0 - 10.5 K/uL 4.2   Hemoglobin 12.0 - 15.0 g/dL 9.7   Hematocrit 36.0 - 46.0 % 30.8   Platelets 150 - 400 K/uL 126     No images are attached to the encounter.  IR Radiologist Eval & Mgmt  Result Date: 08/07/2022 EXAM: NEW PATIENT OFFICE VISIT CHIEF COMPLAINT: Per EMR HISTORY OF PRESENT ILLNESS: Per EMR REVIEW OF SYSTEMS: Per EMR PHYSICAL EXAMINATION: Per EMR ASSESSMENT AND PLAN: Per EMR Electronically Signed   By: Albin Felling M.D.   On: 08/07/2022 15:32     Assessment and plan- Patient is a 82 y.o. female with history of iron deficiency anemia secondary to bleeding AVMs requiring IV iron intermittently and on home octreotide injections here for routine follow-up  Hemoglobin has improved from 6-9.7.  Baseline hemoglobin runs around 11.  Iron studies are indicative of iron deficiency.  We will proceed with 5 doses of Venofer at this time.  I will continue to monitor her CBC ferritin and iron studies in 2 4 and 6 months and see her back in 6 months   Visit Diagnosis 1. Iron deficiency anemia secondary to inadequate dietary iron intake   2. Symptomatic anemia       Dr. Randa Evens, MD, MPH [facility] at [facility] [phone] 08/11/2022 6:31 PM

## 2022-08-17 ENCOUNTER — Inpatient Hospital Stay: Payer: Medicare HMO | Attending: Oncology

## 2022-08-17 VITALS — BP 137/52 | HR 76 | Temp 97.3°F | Resp 17

## 2022-08-17 DIAGNOSIS — D500 Iron deficiency anemia secondary to blood loss (chronic): Secondary | ICD-10-CM | POA: Insufficient documentation

## 2022-08-17 DIAGNOSIS — K5521 Angiodysplasia of colon with hemorrhage: Secondary | ICD-10-CM | POA: Insufficient documentation

## 2022-08-17 DIAGNOSIS — D508 Other iron deficiency anemias: Secondary | ICD-10-CM

## 2022-08-17 DIAGNOSIS — R5383 Other fatigue: Secondary | ICD-10-CM | POA: Diagnosis not present

## 2022-08-17 DIAGNOSIS — Z87891 Personal history of nicotine dependence: Secondary | ICD-10-CM | POA: Diagnosis not present

## 2022-08-17 MED ORDER — EPOETIN ALFA-EPBX 40000 UNIT/ML IJ SOLN: 40000.0000 [IU] | Freq: Once | INTRAMUSCULAR | Status: DC

## 2022-08-17 MED ORDER — SODIUM CHLORIDE 0.9 % IV SOLN
200.0000 mg | Freq: Once | INTRAVENOUS | Status: AC
  Administered 2022-08-17: 200 mg via INTRAVENOUS
  Filled 2022-08-17: qty 200

## 2022-08-17 MED ORDER — OCTREOTIDE ACETATE 20 MG IM KIT: 20.0000 mg | PACK | INTRAMUSCULAR | Status: DC

## 2022-08-17 MED ORDER — SODIUM CHLORIDE 0.9 % IV SOLN
Freq: Once | INTRAVENOUS | Status: AC
  Filled 2022-08-17: qty 250

## 2022-08-17 NOTE — Patient Instructions (Signed)

## 2022-08-17 NOTE — Progress Notes (Signed)
Patient declined to stay for 30 minutes post observation. VSS. Discharged in wheelchair with son.

## 2022-08-21 ENCOUNTER — Inpatient Hospital Stay: Payer: Medicare HMO

## 2022-08-21 VITALS — BP 127/47 | HR 75 | Temp 98.3°F | Resp 18

## 2022-08-21 DIAGNOSIS — D508 Other iron deficiency anemias: Secondary | ICD-10-CM

## 2022-08-21 DIAGNOSIS — R5383 Other fatigue: Secondary | ICD-10-CM | POA: Diagnosis not present

## 2022-08-21 DIAGNOSIS — K5521 Angiodysplasia of colon with hemorrhage: Secondary | ICD-10-CM | POA: Diagnosis not present

## 2022-08-21 DIAGNOSIS — D500 Iron deficiency anemia secondary to blood loss (chronic): Secondary | ICD-10-CM | POA: Diagnosis not present

## 2022-08-21 DIAGNOSIS — Z87891 Personal history of nicotine dependence: Secondary | ICD-10-CM | POA: Diagnosis not present

## 2022-08-21 MED ORDER — SODIUM CHLORIDE 0.9 % IV SOLN
200.0000 mg | Freq: Once | INTRAVENOUS | Status: AC
  Administered 2022-08-21: 200 mg via INTRAVENOUS
  Filled 2022-08-21: qty 200

## 2022-08-21 NOTE — Progress Notes (Signed)
Refused 30 minute observation post-infusion. Aware of potential complications and when to seek medical attention. VSS at discharge.

## 2022-08-21 NOTE — Patient Instructions (Signed)

## 2022-08-22 DIAGNOSIS — I10 Essential (primary) hypertension: Secondary | ICD-10-CM | POA: Diagnosis not present

## 2022-08-22 DIAGNOSIS — D631 Anemia in chronic kidney disease: Secondary | ICD-10-CM | POA: Diagnosis not present

## 2022-08-22 DIAGNOSIS — N1832 Chronic kidney disease, stage 3b: Secondary | ICD-10-CM | POA: Diagnosis not present

## 2022-08-22 DIAGNOSIS — N2889 Other specified disorders of kidney and ureter: Secondary | ICD-10-CM | POA: Diagnosis not present

## 2022-08-22 DIAGNOSIS — E1122 Type 2 diabetes mellitus with diabetic chronic kidney disease: Secondary | ICD-10-CM | POA: Diagnosis not present

## 2022-08-24 ENCOUNTER — Inpatient Hospital Stay: Payer: Medicare HMO

## 2022-08-24 ENCOUNTER — Encounter (HOSPITAL_COMMUNITY): Payer: Self-pay

## 2022-08-24 VITALS — BP 138/48 | HR 74 | Temp 96.0°F | Resp 16

## 2022-08-24 DIAGNOSIS — D500 Iron deficiency anemia secondary to blood loss (chronic): Secondary | ICD-10-CM | POA: Diagnosis not present

## 2022-08-24 DIAGNOSIS — D508 Other iron deficiency anemias: Secondary | ICD-10-CM

## 2022-08-24 DIAGNOSIS — R5383 Other fatigue: Secondary | ICD-10-CM | POA: Diagnosis not present

## 2022-08-24 DIAGNOSIS — N1832 Chronic kidney disease, stage 3b: Secondary | ICD-10-CM | POA: Diagnosis not present

## 2022-08-24 DIAGNOSIS — I10 Essential (primary) hypertension: Secondary | ICD-10-CM | POA: Diagnosis not present

## 2022-08-24 DIAGNOSIS — K5521 Angiodysplasia of colon with hemorrhage: Secondary | ICD-10-CM | POA: Diagnosis not present

## 2022-08-24 DIAGNOSIS — E1140 Type 2 diabetes mellitus with diabetic neuropathy, unspecified: Secondary | ICD-10-CM | POA: Diagnosis not present

## 2022-08-24 DIAGNOSIS — I272 Pulmonary hypertension, unspecified: Secondary | ICD-10-CM | POA: Diagnosis not present

## 2022-08-24 DIAGNOSIS — Z87891 Personal history of nicotine dependence: Secondary | ICD-10-CM | POA: Diagnosis not present

## 2022-08-24 DIAGNOSIS — I700 Atherosclerosis of aorta: Secondary | ICD-10-CM | POA: Diagnosis not present

## 2022-08-24 MED ORDER — SODIUM CHLORIDE 0.9 % IV SOLN
INTRAVENOUS | Status: DC
  Filled 2022-08-24: qty 250

## 2022-08-24 MED ORDER — SODIUM CHLORIDE 0.9 % IV SOLN
200.0000 mg | Freq: Once | INTRAVENOUS | Status: AC
  Administered 2022-08-24: 200 mg via INTRAVENOUS
  Filled 2022-08-24: qty 200

## 2022-08-24 NOTE — Progress Notes (Signed)
Pt refused 30 minute post observation.  Pt understands to present to ED if signs or symptoms of reaction occur.  VSS.

## 2022-08-24 NOTE — Progress Notes (Signed)
Request for pacemaker orders faxed to patients cardiologist 08/24/2022 at 1352. Awaiting response. [employer] Rep emailed 09/03/2022 at 1522 notifying of surgery.

## 2022-08-24 NOTE — Pre-Procedure Instructions (Signed)
Surgical Instructions    Your procedure is scheduled on August 31, 2022.  Report to [facility] Main Entrance "A" at 6:30 A.M., then check in with the Admitting office.  Call this number if you have problems the morning of surgery:  [phone]  If you have any questions prior to your surgery date call [phone]: Open Monday-Friday 8am-4pm If you experience any cold or flu symptoms such as cough, fever, chills, shortness of breath, etc. between now and your scheduled surgery, please notify us at the above number.     Remember:  Do not eat after midnight the night before your surgery  You may drink clear liquids until 5:30 AM the morning of your surgery.   Clear liquids allowed are: Water, Non-Citrus Juices (without pulp), Carbonated Beverages, Clear Tea, Black Coffee Only (NO MILK, CREAM OR POWDERED CREAMER of any kind), and Gatorade.     Take these medicines the morning of surgery with A SIP OF WATER:  omeprazole (PRILOSEC)   simvastatin (ZOCOR)    May take these medicines IF NEEDED:  acetaminophen (TYLENOL)    As of today, STOP taking any Aspirin (unless otherwise instructed by your surgeon) Aleve, Naproxen, Ibuprofen, Motrin, Advil, Goody's, BC's, all herbal medications, fish oil, and all vitamins.   WHAT DO I DO ABOUT MY DIABETES MEDICATION?   Do not take metFORMIN (GLUCOPHAGE-XR) or sitaGLIPtin (JANUVIA)  the morning of surgery.   If you take  insulin glargine (LANTUS) in the morning, take 18 units the morning of surgery.  If you take insulin glargine (LANTUS) in the evening/night, take 18 units the evening/night before surgery and NONE the morning of surgery.     HOW TO MANAGE YOUR DIABETES BEFORE AND AFTER SURGERY  Why is it important to control my blood sugar before and after surgery? Improving blood sugar levels before and after surgery helps healing and can limit problems. A way of improving blood sugar control is eating a healthy diet by:  Eating less  sugar and carbohydrates  Increasing activity/exercise  Talking with your doctor about reaching your blood sugar goals High blood sugars (greater than 180 mg/dL) can raise your risk of infections and slow your recovery, so you will need to focus on controlling your diabetes during the weeks before surgery. Make sure that the doctor who takes care of your diabetes knows about your planned surgery including the date and location.  How do I manage my blood sugar before surgery? Check your blood sugar at least 4 times a day, starting 2 days before surgery, to make sure that the level is not too high or low.  Check your blood sugar the morning of your surgery when you wake up and every 2 hours until you get to the Short Stay unit.  If your blood sugar is less than 70 mg/dL, you will need to treat for low blood sugar: Do not take insulin. Treat a low blood sugar (less than 70 mg/dL) with  cup of clear juice (cranberry or apple), 4 glucose tablets, OR glucose gel. Recheck blood sugar in 15 minutes after treatment (to make sure it is greater than 70 mg/dL). If your blood sugar is not greater than 70 mg/dL on recheck, call [phone] for further instructions. Report your blood sugar to the short stay nurse when you get to Short Stay.  If you are admitted to the hospital after surgery: Your blood sugar will be checked by the staff and you will probably be given insulin after surgery (instead of  oral diabetes medicines) to make sure you have good blood sugar levels. The goal for blood sugar control after surgery is 80-180 mg/dL.                      Do NOT Smoke (Tobacco/Vaping) for 24 hours prior to your procedure.  If you use a CPAP at night, you may bring your mask/headgear for your overnight stay.   Contacts, glasses, piercing's, hearing aid's, dentures or partials may not be worn into surgery, please bring cases for these belongings.    For patients admitted to the hospital, discharge time  will be determined by your treatment team.   Patients discharged the day of surgery will not be allowed to drive home, and someone needs to stay with them for 24 hours.  SURGICAL WAITING ROOM VISITATION Patients having surgery or a procedure may have no more than 2 support people in the waiting area - these visitors may rotate.   Children under the age of 20 must have an adult with them who is not the patient. If the patient needs to stay at the hospital during part of their recovery, the visitor guidelines for inpatient rooms apply. Pre-op nurse will coordinate an appropriate time for 1 support person to accompany patient in pre-op.  This support person may not rotate.   Please refer to the [facility] website for the visitor guidelines for Inpatients (after your surgery is over and you are in a regular room).    Special instructions:   [place]- Preparing For Surgery  Before surgery, you can play an important role. Because skin is not sterile, your skin needs to be as free of germs as possible. You can reduce the number of germs on your skin by washing with CHG (chlorahexidine gluconate) Soap before surgery.  CHG is an antiseptic cleaner which kills germs and bonds with the skin to continue killing germs even after washing.    Oral Hygiene is also important to reduce your risk of infection.  Remember - BRUSH YOUR TEETH THE MORNING OF SURGERY WITH YOUR REGULAR TOOTHPASTE  Please do not use if you have an allergy to CHG or antibacterial soaps. If your skin becomes reddened/irritated stop using the CHG.  Do not shave (including legs and underarms) for at least 48 hours prior to first CHG shower. It is OK to shave your face.  Please follow these instructions carefully.   Shower the NIGHT BEFORE SURGERY and the MORNING OF SURGERY  If you chose to wash your hair, wash your hair first as usual with your normal shampoo.  After you shampoo, rinse your hair and body thoroughly to remove the  shampoo.  Use CHG Soap as you would any other liquid soap. You can apply CHG directly to the skin and wash gently with a scrungie or a clean washcloth.   Apply the CHG Soap to your body ONLY FROM THE NECK DOWN.  Do not use on open wounds or open sores. Avoid contact with your eyes, ears, mouth and genitals (private parts). Wash Face and genitals (private parts)  with your normal soap.   Wash thoroughly, paying special attention to the area where your surgery will be performed.  Thoroughly rinse your body with warm water from the neck down.  DO NOT shower/wash with your normal soap after using and rinsing off the CHG Soap.  Pat yourself dry with a CLEAN TOWEL.  Wear CLEAN PAJAMAS to bed the night before surgery  Place CLEAN  SHEETS on your bed the night before your surgery  DO NOT SLEEP WITH PETS.   Day of Surgery: Take a shower with CHG soap. Do not wear jewelry or makeup Do not wear lotions, powders, perfumes, or deodorant. Do not shave 48 hours prior to surgery. Do not bring valuables to the hospital.  [facility] is not responsible for any belongings or valuables. Do not wear nail polish, gel polish, artificial nails, or any other type of covering on natural nails (fingers and toes) If you have artificial nails or gel coating that need to be removed by a nail salon, please have this removed prior to surgery. Artificial nails or gel coating may interfere with anesthesia's ability to adequately monitor your vital signs.  Wear Clean/Comfortable clothing the morning of surgery Remember to brush your teeth WITH YOUR REGULAR TOOTHPASTE.   Please read over the following fact sheets that you were given.    If you received a COVID test during your pre-op visit  it is requested that you wear a mask when out in public, stay away from anyone that may not be feeling well and notify your surgeon if you develop symptoms. If you have been in contact with anyone that has tested positive in the  last 10 days please notify you surgeon.

## 2022-08-24 NOTE — Patient Instructions (Signed)

## 2022-08-27 ENCOUNTER — Other Ambulatory Visit: Payer: Self-pay

## 2022-08-27 ENCOUNTER — Encounter (HOSPITAL_COMMUNITY): Payer: Self-pay | Admitting: Vascular Surgery

## 2022-08-27 ENCOUNTER — Encounter (HOSPITAL_COMMUNITY)
Admission: RE | Admit: 2022-08-27 | Discharge: 2022-08-27 | Disposition: A | Discharge status: Home / Self Care | Payer: Medicare HMO | Source: Ambulatory Visit | Attending: Interventional Radiology | Admitting: Interventional Radiology

## 2022-08-27 ENCOUNTER — Encounter (HOSPITAL_COMMUNITY): Payer: Self-pay

## 2022-08-27 VITALS — BP 145/53 | HR 91 | Temp 98.2°F | Resp 17 | Ht 64.0 in | Wt 164.0 lb

## 2022-08-27 DIAGNOSIS — Z01812 Encounter for preprocedural laboratory examination: Secondary | ICD-10-CM | POA: Insufficient documentation

## 2022-08-27 DIAGNOSIS — H409 Unspecified glaucoma: Secondary | ICD-10-CM | POA: Insufficient documentation

## 2022-08-27 DIAGNOSIS — E785 Hyperlipidemia, unspecified: Secondary | ICD-10-CM | POA: Insufficient documentation

## 2022-08-27 DIAGNOSIS — Z853 Personal history of malignant neoplasm of breast: Secondary | ICD-10-CM | POA: Insufficient documentation

## 2022-08-27 DIAGNOSIS — I10 Essential (primary) hypertension: Secondary | ICD-10-CM | POA: Insufficient documentation

## 2022-08-27 DIAGNOSIS — Z794 Long term (current) use of insulin: Secondary | ICD-10-CM | POA: Diagnosis not present

## 2022-08-27 DIAGNOSIS — Z87891 Personal history of nicotine dependence: Secondary | ICD-10-CM | POA: Insufficient documentation

## 2022-08-27 DIAGNOSIS — E1140 Type 2 diabetes mellitus with diabetic neuropathy, unspecified: Secondary | ICD-10-CM | POA: Insufficient documentation

## 2022-08-27 DIAGNOSIS — N2889 Other specified disorders of kidney and ureter: Secondary | ICD-10-CM | POA: Insufficient documentation

## 2022-08-27 HISTORY — DX: Presence of cardiac pacemaker: Z95.0

## 2022-08-27 HISTORY — DX: Full incontinence of feces: R15.9

## 2022-08-27 LAB — CBC
HCT: 30.9 % — ABNORMAL LOW (ref 36.0–46.0)
Hemoglobin: 9.8 g/dL — ABNORMAL LOW (ref 12.0–15.0)
MCH: 30.6 pg (ref 26.0–34.0)
MCHC: 31.7 g/dL (ref 30.0–36.0)
MCV: 96.6 fL (ref 80.0–100.0)
Platelets: 134 10*3/uL — ABNORMAL LOW (ref 150–400)
RBC: 3.2 MIL/uL — ABNORMAL LOW (ref 3.87–5.11)
RDW: 17.2 % — ABNORMAL HIGH (ref 11.5–15.5)
WBC: 6.1 10*3/uL (ref 4.0–10.5)
nRBC: 0 % (ref 0.0–0.2)

## 2022-08-27 LAB — GLUCOSE, CAPILLARY: Glucose-Capillary: 340 mg/dL — ABNORMAL HIGH (ref 70–99)

## 2022-08-27 LAB — COMPREHENSIVE METABOLIC PANEL
ALT: 18 U/L (ref 0–44)
AST: 33 U/L (ref 15–41)
Albumin: 3.2 g/dL — ABNORMAL LOW (ref 3.5–5.0)
Alkaline Phosphatase: 72 U/L (ref 38–126)
Anion gap: 11 (ref 5–15)
BUN: 10 mg/dL (ref 8–23)
CO2: 22 mmol/L (ref 22–32)
Calcium: 9 mg/dL (ref 8.9–10.3)
Chloride: 102 mmol/L (ref 98–111)
Creatinine, Ser: 1.32 mg/dL — ABNORMAL HIGH (ref 0.44–1.00)
GFR, Estimated: 41 mL/min — ABNORMAL LOW (ref >60)
Glucose, Bld: 309 mg/dL — ABNORMAL HIGH (ref 70–99)
Potassium: 4.1 mmol/L (ref 3.5–5.1)
Sodium: 135 mmol/L (ref 135–145)
Total Bilirubin: 1 mg/dL (ref 0.3–1.2)
Total Protein: 6.9 g/dL (ref 6.5–8.1)

## 2022-08-27 LAB — HEMOGLOBIN A1C
Hgb A1c MFr Bld: 8.3 % — ABNORMAL HIGH (ref 4.8–5.6)
Mean Plasma Glucose: 191.51 mg/dL

## 2022-08-27 NOTE — Progress Notes (Signed)
Anesthesia PAT Evaluation:  Date/Time: 08/31/22 0830   Procedure: CT GUIDE TISSUE ABLATION   Diagnosis: Left renal mass [N28.89]   Indications: left renal mass   Location: [facility] CT IMAGING       DISCUSSION: Patient is an 82 year old female scheduled for CT renal microwave ablation on 08/31/22 by Olive Bass, MD. She has a left renal mass suspicous for RCC. I evaluated her during 08/27/22 PAT visit per physician request.  History includes former smoker (quit 07/10/11), HTN, HLD, CHB with syncope (s/p [employer] PPM 10/26/20, left chest), DM2, neuropathy, glaucoma, breast cancer (s/p right mastectomy with LN dissection 1995), anemia (with history of GI bleed, AVM s/p APC 2019; s/p iron injection 08/21/22), cirrhosis, C. Difficile infection (10/2020), CKD (IIIb, with AKI 2019), osteoarthritis, RA. S/p L4 kyphoplasty on 06/29/22 by IR [facility]).  Last cardiology evaluation by Dr. Arnoldo Hooker was on 06/20/22 for nine month follow-up and preoperative cardiology evaluation for kyphoplasty. She had no evidence of angina or anginal equivalent symptoms. HTN stable. He noted PPM interrogation showed "normal battery life, normal thresholds, normal impedence, and normal wire function. There appears to be no evidence of significant concerns requiring reprogramming at this time." In regards to preoperative evaluation for kyphoplasty he wrote, "Proceed to surgery and/or invasive procedure without restriction to pre or post operative and/or procedural care. The patient is at lowest risk possible for cardiovascular complications with surgical intervention and/or invasive procedure. Currently has no evidence active and/or significant angina and/or congestive heart failure." She is s/p kyphoplasty on 06/30/21. Next visit is scheduled for 09/18/22. She denied any new CV symptoms since her last cardiology visit. She does use a cane to walk (due to right hip and leg pain) and a wheelchair if prolonged  walking is required.   Her A1c is up to 8.3%, previously 6.7% on 06/04/22 ([facility]). She is followed by endocrinologist Dr. Gershon Crane. Lantus insulin was documented as 36 units daily (and as high as 50 units), but she said she does not make adjustments based on her home CBGs, but rather only if dose is adjusted by Dr. Gershon Crane. She reported that her current dose is 26 units daily. She also recently ran out of her metformin and is waiting for a refill. She is taking Januvia 50 mg daily. Her non-fasting CBG at PAT was 340 (309 with BMET).  She ate a blueberry muffin, strawberries and applesauce for breakfast that morning. She reported her fasting CBGs typically run in the 100's. She is symptomatic when she is hypoglycemic. She lives alone, but has three sons in the area that help her. Her son Dannielle Huh [phone]) will be bringing her on the day of her procedure.   She has chronic iron deficiency anemia with history of gastrointestinal angiodysplasia. Last endoscopy in Fall 2022. She is managed on octreotide injections Q 28 days and periodic Venofer per hematologist Dr. Smith Robert. She is currently on a 5 dose course of Venofer, with #5 scheduled for 08/29/22. HGB up to 9.8, up from 9.7 on 08/06/22 and 6.0 on 05/07/22.   EKG requested from [facility] (done prior to kyphoplasty), and PPM perioperative device prescriptions requested from [facility], Dr. Gwen Pounds. Anesthesia team to evaluate on the day of procedure.    VS: BP (!) 145/53   Pulse 91   Temp 36.8 C   Resp 17   Ht 5\' 4"  (1.626 m)   Wt 74.4 kg   SpO2 95%   BMI 28.15 kg/m  Provider wore face mask.  Patient in hospital wheelchair in no acute distress. No conversational dyspnea. Lungs clear. Heart RRR, no murmur noted. No pitting edema noted. She reported full upper dentures and no lower teeth.    PROVIDERS: Marina Goodell, MD is PCP [facility]) - Arnoldo Hooker, MD is cardiologist [facility]) -  Mady Haagensen, MD is nephrologist. Last visit 08/22/22. CKD felt stable. He noted plans for IR to address left renal lesion. Four month follow-up planned.  Vanna Scotland, MD is urologist - Verdis Frederickson, MD is endocrinologist  - Owens Shark, MD is HEM - She is unsure of who her primary GI provider is. Most recently evaluated by Lannette Donath, MD on 06/09/21.    LABS: Preoperative labs noted. See DISCUSSION. Message sent to Dr. Juliette Alcide regarding glucose, A1c and HGB.  (all labs ordered are listed, but only abnormal results are displayed)  Labs Reviewed  GLUCOSE, CAPILLARY - Abnormal; Notable for the following components:      Result Value   Glucose-Capillary 340 (*)    All other components within normal limits  HEMOGLOBIN A1C - Abnormal; Notable for the following components:   Hgb A1c MFr Bld 8.3 (*)    All other components within normal limits  COMPREHENSIVE METABOLIC PANEL - Abnormal; Notable for the following components:   Glucose, Bld 309 (*)    Creatinine, Ser 1.32 (*)    Albumin 3.2 (*)    GFR, Estimated 41 (*)    All other components within normal limits  CBC - Abnormal; Notable for the following components:   RBC 3.20 (*)    Hemoglobin 9.8 (*)    HCT 30.9 (*)    RDW 17.2 (*)    Platelets 134 (*)    All other components within normal limits     IMAGES: CT Abd/pelvis 06/07/22: IMPRESSION: 1. Avidly enhancing solid 3.6 x 3.3 x 2.9 cm anterior interpolar left renal cortical mass, increased in size since 10/27/2020 CT, compatible with clear cell renal cell carcinoma. 2. No evidence of metastatic disease in the abdomen. 3. Cirrhosis. No liver masses. Moderate gastroesophageal and large paraumbilical varices, compatible with portal hypertension. 4. Coronary atherosclerosis. 5.  Aortic Atherosclerosis (ICD10-I70.0).   EKG: 06/29/22 [facility]): Tracing requested from [facility].  ATRIAL-SENSED VENTRICULAR-PACED RHYTHM  ABNORMAL ECG  WHEN COMPARED WITH ECG OF 07-Sep-2016  16:02,  ELECTRONIC VENTRICULAR PACEMAKER  HAS REPLACED SINUS RHYTHM  Confirmed by Christella Noa ([id number]) on 07/01/2022 1:05:15 PM    CV: Nuclear stress test 12/15/20 ([facility]): FINDINGS:  Regional wall motion:  reveals normal myocardial thickening and wall  motion.  The overall quality of the study is good.   Artifacts noted: no  Left ventricular cavity: normal.  Perfusion Analysis: Resting images show subtle reversible anterolateral  myocardial perfusion defect consistent with possible breast attenuation  versus myocardial ischemia.    Echo 10/26/20: IMPRESSIONS   1. Left ventricular ejection fraction, by estimation, is 65 to 70%. The  left ventricle has normal function. The left ventricle has no regional  wall motion abnormalities. Left ventricular diastolic parameters were  normal.   2. Right ventricular systolic function is normal. The right ventricular  size is normal.   3. Left atrial size was mildly dilated.   4. Right atrial size was mildly dilated.   5. The mitral valve is degenerative. Trivial mitral valve regurgitation.   6. Tricuspid valve regurgitation is moderate.   7. The aortic valve was not well visualized. Aortic valve regurgitation  is trivial.  Mild to moderate aortic valve sclerosis/calcification is  present, without any evidence of aortic stenosis.    Echo 10/11/20 ([facility]): INTERPRETATION  NORMAL LEFT VENTRICULAR SYSTOLIC FUNCTION. LVEF > 55% (estimated) NORMAL RIGHT VENTRICULAR SYSTOLIC FUNCTION  MODERATE VALVULAR REGURGITATION (trivial AR, moderate MR, moderate TR, trivial PR)  NO VALVULAR STENOSIS  MODERATE LA ENLARGEMENT  MILD RV ENLARGEMENT  MILD RA ENLARGEMENT    US Carotid 02/28/06: IMPRESSION:  1.     Slight plaque formation is seen bilaterally.  2.     No hemodynamically significant stenosis is identified on either side.  3.     Antegrade flow is present in both vertebrals.    Past Medical History:  Diagnosis Date   Acute kidney failure  (HCC) 11/16/2020   AKI (acute kidney injury) (HCC) 03/17/2018   Anemia    Cancer (HCC)    breast cancer right mastectomy   Diabetes mellitus without complication (HCC)    GIB (gastrointestinal bleeding) 02/20/2018   Glaucoma    Hyperkalemia 10/24/2020   Hyperlipidemia    Hypertension    Neuropathy    Odynophagia 10/24/2020   Osteoarthritis    Presence of permanent cardiac pacemaker    Rheumatoid arthritis (HCC)    Syncope and collapse 10/24/2020   Telangiectasias     Past Surgical History:  Procedure Laterality Date   BREAST SURGERY     right mastectomy   CHOLECYSTECTOMY     COLONOSCOPY     COLONOSCOPY WITH PROPOFOL N/A 08/14/2016   Procedure: COLONOSCOPY WITH PROPOFOL;  Surgeon: Christena Deem, MD;  Location: [facility] ENDOSCOPY;  Service: Endoscopy;  Laterality: N/A;   COLONOSCOPY WITH PROPOFOL N/A 02/22/2018   Procedure: COLONOSCOPY WITH PROPOFOL;  Surgeon: Wyline Mood, MD;  Location: [facility] ENDOSCOPY;  Service: Gastroenterology;  Laterality: N/A;   ENTEROSCOPY N/A 04/12/2021   Procedure: ENTEROSCOPY;  Surgeon: Toney Reil, MD;  Location: [facility] ENDOSCOPY;  Service: Gastroenterology;  Laterality: N/A;  push   ESOPHAGOGASTRODUODENOSCOPY (EGD) WITH PROPOFOL N/A 08/14/2016   Procedure: ESOPHAGOGASTRODUODENOSCOPY (EGD) WITH PROPOFOL;  Surgeon: Christena Deem, MD;  Location: [facility] ENDOSCOPY;  Service: Endoscopy;  Laterality: N/A;   ESOPHAGOGASTRODUODENOSCOPY (EGD) WITH PROPOFOL N/A 01/30/2018   Procedure: ESOPHAGOGASTRODUODENOSCOPY (EGD) WITH PROPOFOL;  Surgeon: Toledo, Boykin Nearing, MD;  Location: [facility] ENDOSCOPY;  Service: Gastroenterology;  Laterality: N/A;   ESOPHAGOGASTRODUODENOSCOPY (EGD) WITH PROPOFOL N/A 02/22/2018   Procedure: ESOPHAGOGASTRODUODENOSCOPY (EGD) WITH PROPOFOL;  Surgeon: Wyline Mood, MD;  Location: [facility] ENDOSCOPY;  Service: Gastroenterology;  Laterality: N/A;   EXCISIONAL HEMORRHOIDECTOMY     IR RADIOLOGIST EVAL & MGMT  08/07/2022   KYPHOPLASTY N/A 03/20/2018    Procedure: [id number];  Surgeon: Kennedy Bucker, MD;  Location: [facility] ORS;  Service: Orthopedics;  Laterality: N/A;   PACEMAKER IMPLANT N/A 10/26/2020   Procedure: PACEMAKER IMPLANT;  Surgeon: Marcina Millard, MD;  Location: [facility] INVASIVE CV LAB;  Service: Cardiovascular;  Laterality: N/A;    MEDICATIONS:  ACCU-CHEK AVIVA PLUS test strip   ACCU-CHEK SOFTCLIX LANCETS lancets   acetaminophen (TYLENOL) 500 MG tablet   alendronate (FOSAMAX) 70 MG tablet   Biotin 1000 MCG tablet   Calcium Carb-Cholecalciferol (OYSTER SHELL CALCIUM W/D) 500-5 MG-MCG TABS   Cholecalciferol 50 MCG (2000 UT) CAPS   feeding supplement, ENSURE ENLIVE, (ENSURE ENLIVE) LIQD   insulin glargine (LANTUS) 100 UNIT/ML Solostar Pen   Insulin Pen Needle 32G X 4 MM MISC   losartan (COZAAR) 50 MG tablet   Magnesium 100 MG CAPS   metFORMIN (GLUCOPHAGE-XR) 500 MG 24 hr tablet  niacin 500 MG CR capsule   octreotide (SANDOSTATIN LAR) 10 MG injection   omeprazole (PRILOSEC) 40 MG capsule   Probiotic Product (PROBIOTIC PO)   simvastatin (ZOCOR) 20 MG tablet   sitaGLIPtin (JANUVIA) 50 MG tablet   TRAVATAN Z 0.004 % SOLN ophthalmic solution   TURMERIC PO   vitamin B-12 (CYANOCOBALAMIN) 1000 MCG tablet   No current facility-administered medications for this encounter.    0.9 %  sodium chloride infusion   alteplase (CATHFLO ACTIVASE) injection 2 mg   octreotide (SANDOSTATIN LAR) IM injection 20 mg   octreotide (SANDOSTATIN LAR) IM injection 20 mg    Shonna Chock, PA-C Surgical Short Stay/Anesthesiology [facility] Phone [phone] [facility] Phone [phone] 08/28/2022 9:39 AM

## 2022-08-27 NOTE — Progress Notes (Signed)
PCP - Dr. Thereasa Distance Cardiologist - Dr. Sharyne Peach at [facility] Endocrinologist: Dr. Honor Junes  PPM/ICD - Yes Device Orders - requested Rep Notified - Awaiting device orders  Chest x-ray - n/a EKG - 06/29/2022 Stress Test - 12/15/2020 ECHO - 10/11/2020 Cardiac Cath - Denies  Sleep Study - Denies CPAP - Denies  Type II Fasting Blood Sugar - less than 100 Checks Blood Sugar _1____ times a day BS in PAT 340. Patient ate at 1030, used her lantus, but is out of her Metformin.  Emily Gianotti PA-C made aware.    Last dose of GLP1 agonist-  Denies GLP1 instructions: not one  Blood Thinner Instructions: Denies Aspirin Instructions:follow surgeons directions  ERAS Protcol -Yes, clear liquids 3 hours prior to surgery PRE-SURGERY Ensure or G2- No  COVID TEST- No    Anesthesia review: Yes.  Pacemaker, Blood sugar 340 in PAT. Emily Gianotti, PA-C made aware.  Will come to PAT to see patient.    Patient denies shortness of breath, fever, cough and chest pain at PAT appointment   All instructions explained to the patient, with a verbal understanding of the material. Patient agrees to go over the instructions while at home for a better understanding. Patient also instructed to self quarantine after being tested for COVID-19. The opportunity to ask questions was provided.

## 2022-08-27 NOTE — Pre-Procedure Instructions (Signed)
Surgical Instructions    Your procedure is scheduled on August 31, 2022.  Report to [facility] Main Entrance "A" at 6:30 A.M., then check in with the Admitting office.  Call this number if you have problems the morning of surgery:  [phone]  If you have any questions prior to your surgery date call [phone]: Open Monday-Friday 8am-4pm If you experience any cold or flu symptoms such as cough, fever, chills, shortness of breath, etc. between now and your scheduled surgery, please notify us at the above number.     Remember:  Do not eat after midnight the night before your surgery  You may drink clear liquids until 5:30 AM the morning of your surgery.   Clear liquids allowed are: Water, Non-Citrus Juices (without pulp), Carbonated Beverages, Clear Tea, Black Coffee Only (NO MILK, CREAM OR POWDERED CREAMER of any kind), and Gatorade.     Take these medicines the morning of surgery with A SIP OF WATER:  omeprazole (PRILOSEC)   simvastatin (ZOCOR)    May take these medicines IF NEEDED:  acetaminophen (TYLENOL)    As of today, STOP taking any Aspirin (unless otherwise instructed by your surgeon) Aleve, Naproxen, Ibuprofen, Motrin, Advil, Goody's, BC's, all herbal medications, fish oil, and all vitamins.   WHAT DO I DO ABOUT MY DIABETES MEDICATION?   Do not take metFORMIN (GLUCOPHAGE-XR) or sitaGLIPtin (JANUVIA)  the morning of surgery.   If you take  insulin glargine (LANTUS) in the morning, take 13 units the morning of surgery.     HOW TO MANAGE YOUR DIABETES BEFORE AND AFTER SURGERY  Why is it important to control my blood sugar before and after surgery? Improving blood sugar levels before and after surgery helps healing and can limit problems. A way of improving blood sugar control is eating a healthy diet by:  Eating less sugar and carbohydrates  Increasing activity/exercise  Talking with your doctor about reaching your blood sugar goals High blood sugars  (greater than 180 mg/dL) can raise your risk of infections and slow your recovery, so you will need to focus on controlling your diabetes during the weeks before surgery. Make sure that the doctor who takes care of your diabetes knows about your planned surgery including the date and location.  How do I manage my blood sugar before surgery? Check your blood sugar at least 4 times a day, starting 2 days before surgery, to make sure that the level is not too high or low.  Check your blood sugar the morning of your surgery when you wake up and every 2 hours until you get to the Short Stay unit.  If your blood sugar is less than 70 mg/dL, you will need to treat for low blood sugar: Do not take insulin. Treat a low blood sugar (less than 70 mg/dL) with  cup of clear juice (cranberry or apple), 4 glucose tablets, OR glucose gel. Recheck blood sugar in 15 minutes after treatment (to make sure it is greater than 70 mg/dL). If your blood sugar is not greater than 70 mg/dL on recheck, call [phone] for further instructions. Report your blood sugar to the short stay nurse when you get to Short Stay.  If you are admitted to the hospital after surgery: Your blood sugar will be checked by the staff and you will probably be given insulin after surgery (instead of oral diabetes medicines) to make sure you have good blood sugar levels. The goal for blood sugar control after surgery is 80-180 mg/dL.  Do NOT Smoke (Tobacco/Vaping) for 24 hours prior to your procedure.  If you use a CPAP at night, you may bring your mask/headgear for your overnight stay.   Contacts, glasses, piercing's, hearing aid's, dentures or partials may not be worn into surgery, please bring cases for these belongings.    For patients admitted to the hospital, discharge time will be determined by your treatment team.   Patients discharged the day of surgery will not be allowed to drive home, and someone needs  to stay with them for 24 hours.  SURGICAL WAITING ROOM VISITATION Patients having surgery or a procedure may have no more than 2 support people in the waiting area - these visitors may rotate.   Children under the age of 54 must have an adult with them who is not the patient. If the patient needs to stay at the hospital during part of their recovery, the visitor guidelines for inpatient rooms apply. Pre-op nurse will coordinate an appropriate time for 1 support person to accompany patient in pre-op.  This support person may not rotate.   Please refer to the [facility] website for the visitor guidelines for Inpatients (after your surgery is over and you are in a regular room).    Special instructions:   [facility]- Preparing For Surgery  Before surgery, you can play an important role. Because skin is not sterile, your skin needs to be as free of germs as possible. You can reduce the number of germs on your skin by washing with CHG (chlorahexidine gluconate) Soap before surgery.  CHG is an antiseptic cleaner which kills germs and bonds with the skin to continue killing germs even after washing.    Oral Hygiene is also important to reduce your risk of infection.  Remember - BRUSH YOUR TEETH THE MORNING OF SURGERY WITH YOUR REGULAR TOOTHPASTE  Please do not use if you have an allergy to CHG or antibacterial soaps. If your skin becomes reddened/irritated stop using the CHG.  Do not shave (including legs and underarms) for at least 48 hours prior to first CHG shower. It is OK to shave your face.  Please follow these instructions carefully.   Shower the NIGHT BEFORE SURGERY and the MORNING OF SURGERY  If you chose to wash your hair, wash your hair first as usual with your normal shampoo.  After you shampoo, rinse your hair and body thoroughly to remove the shampoo.  Use CHG Soap as you would any other liquid soap. You can apply CHG directly to the skin and wash gently with a scrungie or a  clean washcloth.   Apply the CHG Soap to your body ONLY FROM THE NECK DOWN.  Do not use on open wounds or open sores. Avoid contact with your eyes, ears, mouth and genitals (private parts). Wash Face and genitals (private parts)  with your normal soap.   Wash thoroughly, paying special attention to the area where your surgery will be performed.  Thoroughly rinse your body with warm water from the neck down.  DO NOT shower/wash with your normal soap after using and rinsing off the CHG Soap.  Pat yourself dry with a CLEAN TOWEL.  Wear CLEAN PAJAMAS to bed the night before surgery  Place CLEAN SHEETS on your bed the night before your surgery  DO NOT SLEEP WITH PETS.   Day of Surgery: Take a shower with CHG soap. Do not wear jewelry or makeup Do not wear lotions, powders, perfumes, or deodorant. Do not shave 48  hours prior to surgery. Do not bring valuables to the hospital.  [facility] is not responsible for any belongings or valuables. Do not wear nail polish, gel polish, artificial nails, or any other type of covering on natural nails (fingers and toes) If you have artificial nails or gel coating that need to be removed by a nail salon, please have this removed prior to surgery. Artificial nails or gel coating may interfere with anesthesia's ability to adequately monitor your vital signs.  Wear Clean/Comfortable clothing the morning of surgery Remember to brush your teeth WITH YOUR REGULAR TOOTHPASTE.   Please read over the following fact sheets that you were given.    If you received a COVID test during your pre-op visit  it is requested that you wear a mask when out in public, stay away from anyone that may not be feeling well and notify your surgeon if you develop symptoms. If you have been in contact with anyone that has tested positive in the last 10 days please notify you surgeon.

## 2022-08-28 NOTE — Anesthesia Preprocedure Evaluation (Deleted)
Anesthesia Evaluation    Airway        Dental   Pulmonary former smoker          Cardiovascular hypertension,      Neuro/Psych    GI/Hepatic   Endo/Other  diabetes    Renal/GU      Musculoskeletal   Abdominal   Peds  Hematology   Anesthesia Other Findings   Reproductive/Obstetrics                             Anesthesia Physical Anesthesia Plan  ASA:   Anesthesia Plan:    Post-op Pain Management:    Induction:   PONV Risk Score and Plan:   Airway Management Planned:   Additional Equipment:   Intra-op Plan:   Post-operative Plan:   Informed Consent:   Plan Discussed with:   Anesthesia Plan Comments: (PAT note written 08/28/2022 by Myra Gianotti, PA-C.  )       Anesthesia Quick Evaluation

## 2022-08-29 ENCOUNTER — Other Ambulatory Visit: Payer: Self-pay | Admitting: Radiology

## 2022-08-29 ENCOUNTER — Inpatient Hospital Stay: Payer: Medicare HMO

## 2022-08-29 VITALS — BP 140/51 | HR 75 | Temp 97.9°F | Resp 16

## 2022-08-29 DIAGNOSIS — D508 Other iron deficiency anemias: Secondary | ICD-10-CM

## 2022-08-29 DIAGNOSIS — R5383 Other fatigue: Secondary | ICD-10-CM | POA: Diagnosis not present

## 2022-08-29 DIAGNOSIS — D500 Iron deficiency anemia secondary to blood loss (chronic): Secondary | ICD-10-CM | POA: Diagnosis not present

## 2022-08-29 DIAGNOSIS — N2889 Other specified disorders of kidney and ureter: Secondary | ICD-10-CM

## 2022-08-29 DIAGNOSIS — K5521 Angiodysplasia of colon with hemorrhage: Secondary | ICD-10-CM | POA: Diagnosis not present

## 2022-08-29 DIAGNOSIS — Z87891 Personal history of nicotine dependence: Secondary | ICD-10-CM | POA: Diagnosis not present

## 2022-08-29 MED ORDER — SODIUM CHLORIDE 0.9 % IV SOLN
200.0000 mg | Freq: Once | INTRAVENOUS | Status: AC
  Administered 2022-08-29: 200 mg via INTRAVENOUS
  Filled 2022-08-29: qty 200

## 2022-08-29 MED ORDER — SODIUM CHLORIDE 0.9 % IV SOLN
INTRAVENOUS | Status: DC
  Filled 2022-08-29: qty 250

## 2022-08-29 NOTE — Progress Notes (Signed)
Pt has been educated and understands. Pt refused to stay 30 mins after iron infusion. VSS.

## 2022-08-30 DIAGNOSIS — H401122 Primary open-angle glaucoma, left eye, moderate stage: Secondary | ICD-10-CM | POA: Diagnosis not present

## 2022-08-31 ENCOUNTER — Ambulatory Visit (HOSPITAL_COMMUNITY): Payer: Medicare HMO

## 2022-08-31 ENCOUNTER — Ambulatory Visit (HOSPITAL_BASED_OUTPATIENT_CLINIC_OR_DEPARTMENT_OTHER): Payer: Medicare HMO | Admitting: Physician Assistant

## 2022-08-31 ENCOUNTER — Observation Stay (HOSPITAL_COMMUNITY)
Admission: RE | Admit: 2022-08-31 | Discharge: 2022-08-31 | Disposition: A | Discharge status: Home / Self Care | Payer: Medicare HMO | Source: Ambulatory Visit | Attending: Interventional Radiology | Admitting: Interventional Radiology

## 2022-08-31 ENCOUNTER — Inpatient Hospital Stay: Payer: Medicare HMO

## 2022-08-31 ENCOUNTER — Encounter (HOSPITAL_COMMUNITY)
Admission: RE | Disposition: A | Discharge status: Home / Self Care | Payer: Self-pay | Source: Home / Self Care | Attending: Interventional Radiology

## 2022-08-31 ENCOUNTER — Encounter (HOSPITAL_COMMUNITY): Payer: Self-pay | Admitting: Interventional Radiology

## 2022-08-31 ENCOUNTER — Ambulatory Visit (HOSPITAL_COMMUNITY): Payer: Medicare HMO | Admitting: Physician Assistant

## 2022-08-31 ENCOUNTER — Observation Stay (HOSPITAL_COMMUNITY)
Admission: RE | Admit: 2022-08-31 | Discharge: 2022-09-01 | Disposition: A | Discharge status: Home / Self Care | Payer: Medicare HMO | Attending: Interventional Radiology | Admitting: Interventional Radiology

## 2022-08-31 ENCOUNTER — Other Ambulatory Visit: Payer: Self-pay

## 2022-08-31 DIAGNOSIS — N2889 Other specified disorders of kidney and ureter: Secondary | ICD-10-CM | POA: Diagnosis not present

## 2022-08-31 DIAGNOSIS — Z87891 Personal history of nicotine dependence: Secondary | ICD-10-CM | POA: Diagnosis not present

## 2022-08-31 DIAGNOSIS — C649 Malignant neoplasm of unspecified kidney, except renal pelvis: Secondary | ICD-10-CM | POA: Diagnosis present

## 2022-08-31 DIAGNOSIS — I10 Essential (primary) hypertension: Secondary | ICD-10-CM | POA: Insufficient documentation

## 2022-08-31 DIAGNOSIS — Z79899 Other long term (current) drug therapy: Secondary | ICD-10-CM | POA: Diagnosis not present

## 2022-08-31 DIAGNOSIS — Z950 Presence of cardiac pacemaker: Secondary | ICD-10-CM | POA: Diagnosis not present

## 2022-08-31 DIAGNOSIS — E1140 Type 2 diabetes mellitus with diabetic neuropathy, unspecified: Secondary | ICD-10-CM

## 2022-08-31 DIAGNOSIS — Z7984 Long term (current) use of oral hypoglycemic drugs: Secondary | ICD-10-CM | POA: Diagnosis not present

## 2022-08-31 DIAGNOSIS — Z853 Personal history of malignant neoplasm of breast: Secondary | ICD-10-CM | POA: Diagnosis not present

## 2022-08-31 DIAGNOSIS — E119 Type 2 diabetes mellitus without complications: Secondary | ICD-10-CM | POA: Insufficient documentation

## 2022-08-31 DIAGNOSIS — M069 Rheumatoid arthritis, unspecified: Secondary | ICD-10-CM | POA: Diagnosis not present

## 2022-08-31 DIAGNOSIS — Z794 Long term (current) use of insulin: Secondary | ICD-10-CM | POA: Diagnosis not present

## 2022-08-31 DIAGNOSIS — D649 Anemia, unspecified: Secondary | ICD-10-CM | POA: Diagnosis not present

## 2022-08-31 HISTORY — PX: RADIOLOGY WITH ANESTHESIA: SHX6223

## 2022-08-31 LAB — GLUCOSE, CAPILLARY
Glucose-Capillary: 143 mg/dL — ABNORMAL HIGH (ref 70–99)
Glucose-Capillary: 119 mg/dL — ABNORMAL HIGH (ref 70–99)
Glucose-Capillary: 229 mg/dL — ABNORMAL HIGH (ref 70–99)
Glucose-Capillary: 251 mg/dL — ABNORMAL HIGH (ref 70–99)

## 2022-08-31 LAB — BASIC METABOLIC PANEL
Anion gap: 7 (ref 5–15)
BUN: 9 mg/dL (ref 8–23)
CO2: 24 mmol/L (ref 22–32)
Calcium: 8.8 mg/dL — ABNORMAL LOW (ref 8.9–10.3)
Chloride: 107 mmol/L (ref 98–111)
Creatinine, Ser: 1.28 mg/dL — ABNORMAL HIGH (ref 0.44–1.00)
GFR, Estimated: 42 mL/min — ABNORMAL LOW (ref >60)
Glucose, Bld: 148 mg/dL — ABNORMAL HIGH (ref 70–99)
Potassium: 4 mmol/L (ref 3.5–5.1)
Sodium: 138 mmol/L (ref 135–145)

## 2022-08-31 LAB — TYPE AND SCREEN
ABO/RH(D): O POS
Antibody Screen: NEGATIVE

## 2022-08-31 LAB — CBC
HCT: 30.6 % — ABNORMAL LOW (ref 36.0–46.0)
Hemoglobin: 9.7 g/dL — ABNORMAL LOW (ref 12.0–15.0)
MCH: 30.6 pg (ref 26.0–34.0)
MCHC: 31.7 g/dL (ref 30.0–36.0)
MCV: 96.5 fL (ref 80.0–100.0)
Platelets: 134 10*3/uL — ABNORMAL LOW (ref 150–400)
RBC: 3.17 MIL/uL — ABNORMAL LOW (ref 3.87–5.11)
RDW: 18.1 % — ABNORMAL HIGH (ref 11.5–15.5)
WBC: 5.4 10*3/uL (ref 4.0–10.5)
nRBC: 0 % (ref 0.0–0.2)

## 2022-08-31 LAB — PROTIME-INR
INR: 1.5 — ABNORMAL HIGH (ref 0.8–1.2)
Prothrombin Time: 17.9 seconds — ABNORMAL HIGH (ref 11.4–15.2)

## 2022-08-31 SURGERY — CT WITH ANESTHESIA
Anesthesia: General

## 2022-08-31 MED ORDER — ROCURONIUM BROMIDE 10 MG/ML (PF) SYRINGE
PREFILLED_SYRINGE | INTRAVENOUS | Status: DC | PRN
  Administered 2022-08-31: 80 mg via INTRAVENOUS
  Administered 2022-08-31: 10 mg via INTRAVENOUS

## 2022-08-31 MED ORDER — PROMETHAZINE HCL 25 MG/ML IJ SOLN: 6.2500 mg | INTRAMUSCULAR | Status: DC | PRN

## 2022-08-31 MED ORDER — SODIUM CHLORIDE 0.9 % IV SOLN
2.0000 g | Freq: Once | INTRAVENOUS | Status: AC
  Administered 2022-08-31: 2 g via INTRAVENOUS
  Filled 2022-08-31: qty 20

## 2022-08-31 MED ORDER — SIMVASTATIN 20 MG PO TABS
20.0000 mg | ORAL_TABLET | Freq: Every day | ORAL | Status: DC
  Administered 2022-08-31: 20 mg via ORAL
  Filled 2022-08-31: qty 1

## 2022-08-31 MED ORDER — INSULIN ASPART 100 UNIT/ML IJ SOLN
0.0000 [IU] | Freq: Three times a day (TID) | INTRAMUSCULAR | Status: DC
  Administered 2022-08-31: 3 [IU] via SUBCUTANEOUS
  Administered 2022-09-01: 5 [IU] via SUBCUTANEOUS

## 2022-08-31 MED ORDER — PANTOPRAZOLE SODIUM 40 MG PO TBEC
40.0000 mg | DELAYED_RELEASE_TABLET | Freq: Every day | ORAL | Status: DC
  Administered 2022-09-01: 40 mg via ORAL
  Filled 2022-08-31: qty 1

## 2022-08-31 MED ORDER — LOSARTAN POTASSIUM 25 MG PO TABS
25.0000 mg | ORAL_TABLET | Freq: Every day | ORAL | Status: DC
  Administered 2022-09-01: 25 mg via ORAL
  Filled 2022-08-31: qty 1

## 2022-08-31 MED ORDER — SUGAMMADEX SODIUM 200 MG/2ML IV SOLN
INTRAVENOUS | Status: DC | PRN
  Administered 2022-08-31: 200 mg via INTRAVENOUS

## 2022-08-31 MED ORDER — CHLORHEXIDINE GLUCONATE 0.12 % MT SOLN
15.0000 mL | Freq: Once | OROMUCOSAL | Status: AC
  Administered 2022-08-31: 15 mL via OROMUCOSAL
  Filled 2022-08-31: qty 15

## 2022-08-31 MED ORDER — FENTANYL CITRATE (PF) 100 MCG/2ML IJ SOLN: 25.0000 ug | INTRAMUSCULAR | Status: DC | PRN

## 2022-08-31 MED ORDER — ONDANSETRON HCL 4 MG/2ML IJ SOLN
INTRAMUSCULAR | Status: DC | PRN
  Administered 2022-08-31: 4 mg via INTRAVENOUS

## 2022-08-31 MED ORDER — FENTANYL CITRATE (PF) 250 MCG/5ML IJ SOLN
INTRAMUSCULAR | Status: DC | PRN
  Administered 2022-08-31: 100 ug via INTRAVENOUS

## 2022-08-31 MED ORDER — LIDOCAINE 2% (20 MG/ML) 5 ML SYRINGE
INTRAMUSCULAR | Status: DC | PRN
  Administered 2022-08-31: 100 mg via INTRAVENOUS

## 2022-08-31 MED ORDER — ORAL CARE MOUTH RINSE: 15.0000 mL | Freq: Once | OROMUCOSAL | Status: AC

## 2022-08-31 MED ORDER — HYDROCODONE-ACETAMINOPHEN 5-325 MG PO TABS: 1.0000 | ORAL_TABLET | ORAL | Status: DC | PRN

## 2022-08-31 MED ORDER — PROPOFOL 10 MG/ML IV BOLUS
INTRAVENOUS | Status: DC | PRN
  Administered 2022-08-31: 140 mg via INTRAVENOUS

## 2022-08-31 MED ORDER — AMISULPRIDE (ANTIEMETIC) 5 MG/2ML IV SOLN: 10.0000 mg | Freq: Once | INTRAVENOUS | Status: DC | PRN

## 2022-08-31 MED ORDER — PHENYLEPHRINE HCL-NACL 20-0.9 MG/250ML-% IV SOLN
INTRAVENOUS | Status: DC | PRN
  Administered 2022-08-31: 25 ug/min via INTRAVENOUS

## 2022-08-31 MED ORDER — ONDANSETRON HCL 4 MG/2ML IJ SOLN: 4.0000 mg | Freq: Four times a day (QID) | INTRAMUSCULAR | Status: DC | PRN

## 2022-08-31 MED ORDER — ACETAMINOPHEN 500 MG PO TABS
1000.0000 mg | ORAL_TABLET | Freq: Once | ORAL | Status: AC
  Administered 2022-08-31: 1000 mg via ORAL
  Filled 2022-08-31: qty 2

## 2022-08-31 MED ORDER — PHENYLEPHRINE 80 MCG/ML (10ML) SYRINGE FOR IV PUSH (FOR BLOOD PRESSURE SUPPORT)
PREFILLED_SYRINGE | INTRAVENOUS | Status: DC | PRN
  Administered 2022-08-31 (×4): 80 ug via INTRAVENOUS

## 2022-08-31 MED ORDER — SODIUM CHLORIDE 0.9 % IV SOLN: INTRAVENOUS | Status: DC

## 2022-08-31 MED ORDER — DEXAMETHASONE SODIUM PHOSPHATE 10 MG/ML IJ SOLN
INTRAMUSCULAR | Status: DC | PRN
  Administered 2022-08-31: 10 mg via INTRAVENOUS

## 2022-08-31 MED ORDER — LACTATED RINGERS IV SOLN: INTRAVENOUS | Status: DC | PRN

## 2022-08-31 MED ORDER — SODIUM CHLORIDE 0.9 % IV SOLN
INTRAVENOUS | Status: AC
  Filled 2022-08-31: qty 20

## 2022-08-31 NOTE — H&P (Signed)
  The note originally documented on this encounter has been moved the the encounter in which it belongs.  

## 2022-08-31 NOTE — Procedures (Addendum)
Interventional Radiology Procedure Note  Date of Procedure: 08/31/2022  Procedure: CT renal tumor microwave ablation   Findings:  1. Successful CT guided left renal tumor MWA   2. Access site(s): left flank, percutaneous   Complications: No immediate complications noted.   Estimated Blood Loss: minimal  Follow-up and Recommendations: 1. Bedrest 4 hours  2. Remove Foley at 4 hours and ambulate  3. Re-assess for overnight stay vs discharge this afternoon  4. Follow up with me in clinic in 2-3 weeks, with CT renal protocol in 6 months    Albin Felling, MD  Vascular & Interventional Radiology  08/31/2022 11:46 AM

## 2022-08-31 NOTE — H&P (Signed)
  The note originally documented on this encounter has been moved the the encounter in which it belongs.    Types: Cigarettes    Quit date: 07/10/2011    Years since quitting: 11.1   Smokeless tobacco: Never  Vaping Use   Vaping Use: Never used  Substance and Sexual Activity   Alcohol use: No   Drug use: No   Sexual activity: Not Currently  Other Topics Concern   Not on file  Social History Narrative   Not on file   Social Determinants of Health   Financial Resource Strain: Not on file  Food Insecurity: No Food Insecurity (02/05/2022)   Hunger Vital Sign    Worried About Running Out of Food in the Last Year: Never true    Ran Out of Food in the Last Year: Never true  Transportation Needs: No Transportation Needs (02/05/2022)   PRAPARE - Hydrologist (Medical): No    Lack of Transportation (Non-Medical): No  Physical Activity: Not on file  Stress: Not on file  Social Connections: Not on file    Review of Systems: A 12 point ROS discussed and pertinent positives are indicated in the HPI above.   All other systems are negative.  Review of Systems  Constitutional: Negative.   HENT: Negative.    Eyes: Negative.   Respiratory: Negative.    Cardiovascular: Negative.   Gastrointestinal:  Positive for diarrhea. Negative for blood in stool.  Genitourinary: Negative.   Musculoskeletal:  Positive for back pain.  Neurological: Negative.   Endo/Heme/Allergies: Negative.   Psychiatric/Behavioral: Negative.      Vital Signs: BP 148/56 HR 99 Resp 18 Temp 98.7 SpO2 95%  Advance Care Plan: Full Code  Physical Exam Vitals reviewed.  Constitutional:      General: She is not in acute distress. HENT:     Head: Normocephalic and atraumatic.     Mouth/Throat:     Mouth: Mucous membranes are dry.     Pharynx: Oropharynx is clear.  Cardiovascular:     Rate and Rhythm: Normal rate.     Pulses: Normal pulses.  Pulmonary:     Effort: Pulmonary effort is normal. No respiratory distress.  Abdominal:     General: Abdomen is flat.     Palpations: Abdomen is soft.  Skin:    General: Skin is dry.  Neurological:     General: No focal deficit present.     Mental Status: She is alert and oriented to person, place, and time.  Psychiatric:        Mood and Affect: Mood normal.        Behavior: Behavior normal.    Imaging: IR Radiologist Eval & Mgmt  Result Date: 08/07/2022 EXAM: NEW PATIENT OFFICE VISIT CHIEF COMPLAINT: Per EMR HISTORY OF PRESENT ILLNESS: Per EMR REVIEW OF SYSTEMS: Per EMR PHYSICAL EXAMINATION: Per EMR ASSESSMENT AND PLAN: Per EMR Electronically Signed   By: Albin Felling M.D.   On: 08/07/2022 15:32    Labs:  CBC: Recent Labs    05/07/22 1118 08/06/22 1336 08/27/22 1500 08/31/22 0711  WBC 4.7 4.2 6.1 5.4  HGB 6.0* 9.7* 9.8* 9.7*  HCT 21.6* 30.8* 30.9* 30.6*  PLT 161 126* 134* 134*    COAGS: Recent Labs    08/31/22 0711  INR 1.5*    BMP: Recent Labs    06/07/22 1400 08/27/22 1500  NA  --  135  K  --  4.1  CL  --  102  CO2  --  22  GLUCOSE  --   309*  BUN  --  10  CALCIUM  --  9.0  CREATININE 1.30* 1.32*  GFRNONAA  --  41*    LIVER FUNCTION TESTS: Recent Labs    08/27/22 1500  BILITOT 1.0  AST 33  ALT 18  ALKPHOS 72  PROT 6.9  ALBUMIN 3.2*   Assessment and Plan:  Left renal mass Enlarging since 2022, suspected clear cell RCC, no evidence of mets in abdomen Pt is appropriately NPO and held thinners, if any Labs, meds, vitals, imaging, and history reviewed Will proceed with planned left renal microwave ablation, possible biopsy, and anticipated overnight observation for pain control.  Risks and benefits of image guided renal microwave ablation was discussed with the patient including, but not limited to, failure to treat entire lesion, bleeding, infection, damage to adjacent structures, hematuria, urine leak, decrease in renal function or post procedural neuropathy.  All of the patient's questions were answered and the patient is agreeable to proceed. Consent signed and in chart.   Thank you for this interesting consult.  I greatly enjoyed meeting Emily Faulkner and look forward to participating in their care.  A copy of this report was sent to the requesting provider on this date.  Electronically Signed: Pasty Spillers, PA 08/31/2022, 7:48 AM   I spent a total of  25 Minutes in face to face in clinical consultation, greater than 50% of which was counseling/coordinating care for left renal mass.

## 2022-08-31 NOTE — Plan of Care (Signed)
  Problem: Education: Goal: Knowledge of General Education information will improve Description Including pain rating scale, medication(s)/side effects and non-pharmacologic comfort measures Outcome: Progressing   Problem: Health Behavior/Discharge Planning: Goal: Ability to manage health-related needs will improve Outcome: Progressing   

## 2022-08-31 NOTE — Anesthesia Postprocedure Evaluation (Signed)
Anesthesia Post Note  Patient: Emily Faulkner  Procedure(s) Performed: CT renal microwave ablation     Patient location during evaluation: PACU Anesthesia Type: General Level of consciousness: sedated Pain management: pain level controlled Vital Signs Assessment: post-procedure vital signs reviewed and stable Respiratory status: spontaneous breathing and respiratory function stable Cardiovascular status: stable Postop Assessment: no apparent nausea or vomiting Anesthetic complications: no   No notable events documented.  Last Vitals:  Vitals:   08/31/22 1230 08/31/22 1245  BP: (!) 128/52 (!) 133/54  Pulse: 74 74  Resp: 13 13  Temp:    SpO2: 94% 96%    Last Pain:  Vitals:   08/31/22 1230  TempSrc:   PainSc: Asleep                 Margaux Engen DANIEL

## 2022-08-31 NOTE — Transfer of Care (Signed)
Immediate Anesthesia Transfer of Care Note  Patient: Emily Faulkner  Procedure(s) Performed: CT renal microwave ablation  Patient Location: PACU  Anesthesia Type:General  Level of Consciousness: awake, drowsy, and patient cooperative  Airway & Oxygen Therapy: Patient Spontanous Breathing and Patient connected to nasal cannula oxygen  Post-op Assessment: Report given to RN, Post -op Vital signs reviewed and stable, and Patient moving all extremities X 4  Post vital signs: Reviewed and stable  Last Vitals:  Vitals Value Taken Time  BP 139/56 08/31/22 1130  Temp 36.4 C 08/31/22 1130  Pulse 76 08/31/22 1134  Resp 13 08/31/22 1134  SpO2 93 % 08/31/22 1134  Vitals shown include unvalidated device data.  Last Pain:  Vitals:   08/31/22 1130  TempSrc:   PainSc: Asleep         Complications: No notable events documented.

## 2022-08-31 NOTE — Anesthesia Procedure Notes (Signed)
Procedure Name: Intubation Date/Time: 08/31/2022 8:47 AM  Performed by: Gaylene Brooks, CRNAPre-anesthesia Checklist: Patient identified, Emergency Drugs available, Suction available and Patient being monitored Patient Re-evaluated:Patient Re-evaluated prior to induction Oxygen Delivery Method: Circle System Utilized Preoxygenation: Pre-oxygenation with 100% oxygen Induction Type: IV induction Ventilation: Mask ventilation without difficulty Laryngoscope Size: Miller and 2 Grade View: Grade I Tube type: Oral Tube size: 7.0 mm Number of attempts: 1 Airway Equipment and Method: Stylet and Oral airway Placement Confirmation: ETT inserted through vocal cords under direct vision, positive ETCO2 and breath sounds checked- equal and bilateral Secured at: 21 cm Tube secured with: Tape Dental Injury: Teeth and Oropharynx as per pre-operative assessment

## 2022-08-31 NOTE — Anesthesia Preprocedure Evaluation (Addendum)
Anesthesia Evaluation  Patient identified by MRN, date of birth, ID band Patient awake    Reviewed: Allergy & Precautions, NPO status , Patient's Chart, lab work & pertinent test results  History of Anesthesia Complications Negative for: history of anesthetic complications  Airway Mallampati: I  TM Distance: >3 FB Neck ROM: Full    Dental  (+) Edentulous Upper, Dental Advisory Given   Pulmonary former smoker   Pulmonary exam normal        Cardiovascular hypertension, Pt. on medications Normal cardiovascular exam+ pacemaker   Nuclear stress test 12/15/20 ([facility]): FINDINGS:  Regional wall motion:  reveals normal myocardial thickening and wall  motion.  The overall quality of the study is good.   Artifacts noted: no  Left ventricular cavity: normal.  Perfusion Analysis: Resting images show subtle reversible anterolateral  myocardial perfusion defect consistent with possible breast attenuation  versus myocardial ischemia.      Echo 10/26/20: IMPRESSIONS   1. Left ventricular ejection fraction, by estimation, is 65 to 70%. The  left ventricle has normal function. The left ventricle has no regional  wall motion abnormalities. Left ventricular diastolic parameters were  normal.   2. Right ventricular systolic function is normal. The right ventricular  size is normal.   3. Left atrial size was mildly dilated.   4. Right atrial size was mildly dilated.   5. The mitral valve is degenerative. Trivial mitral valve regurgitation.   6. Tricuspid valve regurgitation is moderate.   7. The aortic valve was not well visualized. Aortic valve regurgitation  is trivial. Mild to moderate aortic valve sclerosis/calcification is  present, without any evidence of aortic stenosis.       Neuro/Psych negative neurological ROS  negative psych ROS   GI/Hepatic Neg liver ROS,,,Hx AVM/GI bleed   Endo/Other  diabetes    Renal/GU Renal Mass      Musculoskeletal  (+) Arthritis , Rheumatoid disorders,    Abdominal   Peds  Hematology  (+) anemia   Anesthesia Other Findings   Reproductive/Obstetrics                             Anesthesia Physical Anesthesia Plan  ASA: 3  Anesthesia Plan:    Post-op Pain Management: Minimal or no pain anticipated and Tylenol PO (pre-op)*   Induction: Intravenous  PONV Risk Score and Plan: 3 and Ondansetron, Dexamethasone and Diphenhydramine  Airway Management Planned: Oral ETT  Additional Equipment:   Intra-op Plan:   Post-operative Plan: Extubation in OR  Informed Consent: I have reviewed the patients History and Physical, chart, labs and discussed the procedure including the risks, benefits and alternatives for the proposed anesthesia with the patient or authorized representative who has indicated his/her understanding and acceptance.     Dental advisory given  Plan Discussed with: Anesthesiologist and CRNA  Anesthesia Plan Comments: (PAT note written 08/28/2022 by Myra Gianotti, PA-C.  )       Anesthesia Quick Evaluation

## 2022-09-01 DIAGNOSIS — Z853 Personal history of malignant neoplasm of breast: Secondary | ICD-10-CM | POA: Diagnosis not present

## 2022-09-01 DIAGNOSIS — E119 Type 2 diabetes mellitus without complications: Secondary | ICD-10-CM | POA: Diagnosis not present

## 2022-09-01 DIAGNOSIS — N2889 Other specified disorders of kidney and ureter: Secondary | ICD-10-CM | POA: Diagnosis not present

## 2022-09-01 DIAGNOSIS — Z950 Presence of cardiac pacemaker: Secondary | ICD-10-CM | POA: Diagnosis not present

## 2022-09-01 DIAGNOSIS — Z79899 Other long term (current) drug therapy: Secondary | ICD-10-CM | POA: Diagnosis not present

## 2022-09-01 DIAGNOSIS — Z7984 Long term (current) use of oral hypoglycemic drugs: Secondary | ICD-10-CM | POA: Diagnosis not present

## 2022-09-01 DIAGNOSIS — I10 Essential (primary) hypertension: Secondary | ICD-10-CM | POA: Diagnosis not present

## 2022-09-01 DIAGNOSIS — Z794 Long term (current) use of insulin: Secondary | ICD-10-CM | POA: Diagnosis not present

## 2022-09-01 DIAGNOSIS — Z87891 Personal history of nicotine dependence: Secondary | ICD-10-CM | POA: Diagnosis not present

## 2022-09-01 LAB — CBC
HCT: 30.6 % — ABNORMAL LOW (ref 36.0–46.0)
Hemoglobin: 9.6 g/dL — ABNORMAL LOW (ref 12.0–15.0)
MCH: 30.6 pg (ref 26.0–34.0)
MCHC: 31.4 g/dL (ref 30.0–36.0)
MCV: 97.5 fL (ref 80.0–100.0)
Platelets: 142 10*3/uL — ABNORMAL LOW (ref 150–400)
RBC: 3.14 MIL/uL — ABNORMAL LOW (ref 3.87–5.11)
RDW: 18.5 % — ABNORMAL HIGH (ref 11.5–15.5)
WBC: 8.9 10*3/uL (ref 4.0–10.5)
nRBC: 0 % (ref 0.0–0.2)

## 2022-09-01 LAB — BASIC METABOLIC PANEL
Anion gap: 12 (ref 5–15)
BUN: 20 mg/dL (ref 8–23)
CO2: 19 mmol/L — ABNORMAL LOW (ref 22–32)
Calcium: 8.8 mg/dL — ABNORMAL LOW (ref 8.9–10.3)
Chloride: 105 mmol/L (ref 98–111)
Creatinine, Ser: 1.5 mg/dL — ABNORMAL HIGH (ref 0.44–1.00)
GFR, Estimated: 35 mL/min — ABNORMAL LOW (ref >60)
Glucose, Bld: 363 mg/dL — ABNORMAL HIGH (ref 70–99)
Potassium: 4.9 mmol/L (ref 3.5–5.1)
Sodium: 136 mmol/L (ref 135–145)

## 2022-09-01 NOTE — Discharge Summary (Signed)
Patient ID: Emily Faulkner MRN: [id number] DOB/AGE: [birth year] 82 y.o.  Admit date: 08/31/2022 Discharge date: 09/01/2022  Supervising Physician: Jacqulynn Cadet  Patient Status: [facility] - In-pt  Admission Diagnoses: Left renal mass  Discharge Diagnoses:  Principal Problem:   Renal cancer [facility])   Discharged Condition: good  Hospital Course: [name] presented for planned outpatient left renal mass cryoablation on 08/31/22 with Dr. Denna Haggard. The procedure was performed under general anesthesia without complication and patient was admitted for overnight observation. No overnight events noted by nursing staff.  Patient seen at bedside this morning, family member present. She denies complaints, has no pain at this time, is able to ambulate, has urinated post foley removal and is tolerating liquid and solid intake. She feels ready to go home. Reviewed 2-3 week follow up with Dr. Denna Haggard, patient prefers [place] office and order has been sent for this to be scheduled. Contact info placed in chart and patient encouraged to call with any questions or concerns prior to her follow up appointment. Return precautions reviewed to which patient states understanding.  Consults:  Anesthesia  Significant Diagnostic Studies: CT GUIDE TISSUE ABLATION  Result Date: 08/31/2022 INDICATION: Left renal mass EXAM: 1. CT-guided microwave ablation of left renal mass 2. CT abdomen with and without IV contrast COMPARISON:  None MEDICATIONS: Documented in the EMR ANESTHESIA/SEDATION: General anesthesia - refer to separate anesthesia records FLUOROSCOPY: N/a COMPLICATIONS: None immediate. TECHNIQUE: Informed written consent was obtained from the patient after a thorough discussion of the procedural risks, benefits and alternatives. All questions were addressed. Maximal Sterile Barrier Technique was utilized including caps, mask, sterile gowns, sterile gloves, sterile drape, hand hygiene and skin antiseptic. A timeout  was performed prior to the initiation of the procedure. The patient was placed prone on the exam table after the induction of general anesthesia. Limited noncontrasted CT of the abdomen was performed for planning purposes. This again demonstrated the relatively hyperdense mass in the left renal upper pole. Note was made adjacent left hemicolon. The decision was then made to proceed with hydro dissection to provide a safe plain for microwave ablation. Under intermittent CT fluoroscopy, an 18 gauge trocar needle was advanced into the fat plane between the mass in the left kidney in the left hemicolon. Approximately 500-600 mL of normal saline was then injected. Post hydro dissection CT imaging demonstrated expected displacement of the left hemicolon away from the lesion. Skin entry sites were then planned using a 2 probe approach (NeuWave PR probe 15 cm x2). Under intermittent CT fluoroscopy, the microwave probes were then placed within the center of the left renal mass in a parallel fashion. Location was confirmed with CT. Microwave ablation was then performed at 65 watts for 5.5 minutes. Intermittent CT imaging during the ablation demonstrated appropriate formation of gas within the lesion. Upon completion of the ablation, the microwave probes were removed. Following ablation, CT of the abdomen with and without IV contrast was performed using a renal mass protocol. Imaging demonstrated expected postablation changes without residual enhancing tumor or complicating feature/hemorrhage. A clean dressing was placed at the skin entry sites. Patient tolerated the procedure well without immediate complication, and was transferred to the postanesthesia care unit in stable condition. FINDINGS: As above. IMPRESSION: 1. Successful CT-guided microwave ablation of the left renal mass. 2. Postablation CT abdomen with and without IV contrast demonstrated no residual enhancing tumor. 3. Plan for follow-up with interventional  radiology in 2-3 weeks, with follow-up posttreatment CT renal protocol in 6  months. Electronically Signed   By: Albin Felling M.D.   On: 08/31/2022 13:51   IR Radiologist Eval & Mgmt  Result Date: 08/07/2022 EXAM: NEW PATIENT OFFICE VISIT CHIEF COMPLAINT: Per EMR HISTORY OF PRESENT ILLNESS: Per EMR REVIEW OF SYSTEMS: Per EMR PHYSICAL EXAMINATION: Per EMR ASSESSMENT AND PLAN: Per EMR Electronically Signed   By: Albin Felling M.D.   On: 08/07/2022 15:32    Treatments: IV hydration   Discharge Exam: Blood pressure (!) 127/49, pulse 86, temperature 98 F (36.7 C), temperature source Oral, resp. rate 16, height '5\' 4"'$  (1.626 m), weight 165 lb (74.8 kg), SpO2 93 %. Physical Exam Vitals and nursing note reviewed.  Constitutional:      General: She is not in acute distress. HENT:     Head: Normocephalic.  Cardiovascular:     Rate and Rhythm: Normal rate and regular rhythm.  Pulmonary:     Effort: Pulmonary effort is normal.     Breath sounds: Normal breath sounds.  Abdominal:     General: There is no distension.     Palpations: Abdomen is soft.     Tenderness: There is no abdominal tenderness.  Skin:    General: Skin is warm and dry.     Comments: Puncture sites x 2 clean, dry, dressed appropriately. Non tender. No bleeding or drainage.  Neurological:     Mental Status: She is alert and oriented to person, place, and time.  Psychiatric:        Mood and Affect: Mood normal.        Behavior: Behavior normal.        Thought Content: Thought content normal.        Judgment: Judgment normal.     Disposition:    Allergies as of 09/01/2022   No Known Allergies      Medication List     TAKE these medications    Accu-Chek Aviva Plus test strip Generic drug: glucose blood   Accu-Chek Softclix Lancets lancets once daily Use as instructed.   acetaminophen 500 MG tablet Commonly known as: TYLENOL Take 500 mg by mouth every 8 (eight) hours as needed for moderate pain or mild  pain.   alendronate 70 MG tablet Commonly known as: FOSAMAX TAKE 1 TABLET BY MOUTH EVERY 7DAYS WITH A FULL GLASS OF WATER. DO NOT LIE DOWN FOR 30 MINUTES   Biotin 1000 MCG tablet Take 1,000 mcg by mouth daily.   Cholecalciferol 50 MCG (2000 UT) Caps Take 2,000 Units by mouth daily.   cyanocobalamin 1000 MCG tablet Commonly known as: VITAMIN B12 Take 2,000 mcg by mouth daily.   feeding supplement Liqd Take 237 mLs by mouth 2 (two) times daily between meals. What changed: when to take this   insulin glargine 100 UNIT/ML Solostar Pen Commonly known as: LANTUS Inject 26 Units into the skin daily. (may take up to 50u based upon blood glucose reading)   Insulin Pen Needle 32G X 4 MM Misc USE 1 NEEDLE SUBCUTANEOUSLY ONCE A DAY AS DIRECTED   losartan 50 MG tablet Commonly known as: COZAAR Take 25 mg by mouth daily.   Magnesium 100 MG Caps Take 2 capsules by mouth daily. Glycinate   metFORMIN 500 MG 24 hr tablet Commonly known as: GLUCOPHAGE-XR Take 500 mg by mouth daily with breakfast.   niacin 500 MG CR capsule Take 500 mg by mouth at bedtime.   octreotide 10 MG injection Commonly known as: SANDOSTATIN LAR Inject 10 mg into the muscle every  28 (twenty-eight) days. Inject 10 mg into the muscle every 28 (twenty-eight) days   omeprazole 40 MG capsule Commonly known as: PRILOSEC TAKE 1 CAPSULE BY MOUTH EVERY DAY BEFORE BREAKFAST   Oyster Shell Calcium w/D 500-5 MG-MCG Tabs Take 1 tablet by mouth daily.   PROBIOTIC PO Take 1 tablet by mouth daily. 2 billion   simvastatin 20 MG tablet Commonly known as: ZOCOR Take 20 mg by mouth at bedtime.   sitaGLIPtin 50 MG tablet Commonly known as: JANUVIA Take 50 mg by mouth daily.   Travatan Z 0.004 % Soln ophthalmic solution Generic drug: Travoprost (BAK Free) Place 1 drop into both eyes at bedtime.   TURMERIC PO Take 538 mg by mouth daily.          Electronically Signed: Joaquim Nam, PA-C 09/01/2022,  10:48 AM   I have spent Greater Than 30 Minutes discharging [name].

## 2022-09-03 ENCOUNTER — Encounter (HOSPITAL_COMMUNITY): Payer: Self-pay | Admitting: Interventional Radiology

## 2022-09-03 LAB — GLUCOSE, CAPILLARY
Glucose-Capillary: 240 mg/dL — ABNORMAL HIGH (ref 70–99)
Glucose-Capillary: 348 mg/dL — ABNORMAL HIGH (ref 70–99)

## 2022-09-04 ENCOUNTER — Inpatient Hospital Stay: Payer: Medicare HMO

## 2022-09-04 VITALS — BP 148/51 | HR 76 | Temp 98.5°F | Resp 16

## 2022-09-04 DIAGNOSIS — D508 Other iron deficiency anemias: Secondary | ICD-10-CM

## 2022-09-04 DIAGNOSIS — R5383 Other fatigue: Secondary | ICD-10-CM | POA: Diagnosis not present

## 2022-09-04 DIAGNOSIS — Z87891 Personal history of nicotine dependence: Secondary | ICD-10-CM | POA: Diagnosis not present

## 2022-09-04 DIAGNOSIS — K5521 Angiodysplasia of colon with hemorrhage: Secondary | ICD-10-CM | POA: Diagnosis not present

## 2022-09-04 DIAGNOSIS — D500 Iron deficiency anemia secondary to blood loss (chronic): Secondary | ICD-10-CM | POA: Diagnosis not present

## 2022-09-04 MED ORDER — SODIUM CHLORIDE 0.9 % IV SOLN
INTRAVENOUS | Status: DC
  Filled 2022-09-04: qty 250

## 2022-09-04 MED ORDER — SODIUM CHLORIDE 0.9 % IV SOLN
200.0000 mg | Freq: Once | INTRAVENOUS | Status: AC
  Administered 2022-09-04: 200 mg via INTRAVENOUS
  Filled 2022-09-04: qty 200

## 2022-09-04 NOTE — Progress Notes (Signed)
Patient declined to wait the 30 minutes for post infusion observation today.

## 2022-09-06 ENCOUNTER — Other Ambulatory Visit (HOSPITAL_COMMUNITY): Payer: Self-pay | Admitting: Diagnostic Radiology

## 2022-09-10 DIAGNOSIS — D508 Other iron deficiency anemias: Secondary | ICD-10-CM | POA: Diagnosis not present

## 2022-09-12 ENCOUNTER — Ambulatory Visit
Admission: RE | Admit: 2022-09-12 | Discharge: 2022-09-12 | Disposition: A | Discharge status: Home / Self Care | Payer: Medicare HMO | Source: Ambulatory Visit | Attending: Physician Assistant | Admitting: Physician Assistant

## 2022-09-12 DIAGNOSIS — Z5189 Encounter for other specified aftercare: Secondary | ICD-10-CM | POA: Diagnosis not present

## 2022-09-12 DIAGNOSIS — N2889 Other specified disorders of kidney and ureter: Secondary | ICD-10-CM

## 2022-09-12 HISTORY — PX: IR RADIOLOGIST EVAL & MGMT: IMG5224

## 2022-09-12 NOTE — Progress Notes (Signed)
Chief Complaint: Patient was consulted remotely today (TeleHealth) for left renal mass at the request of [place].    Referring Physician(s): Hollice Espy  History of Present Illness: Emily Faulkner is a 82 y.o. female With a history of a large left upper pole anterior renal mass measuring up to 3.6 cm.  The lesion is concerning for a papillary renal cell carcinoma.  Patient underwent percutaneous thermal ablation on 08/31/2022.  We spoke over the telephone today for her 2-week follow-up evaluation.  She is doing very well.  She has no symptoms.  She denies flank pain, abdominal pain, nausea, vomiting, hematuria or other systemic symptoms.  Past Medical History:  Diagnosis Date   Acute kidney failure ([place]) 11/16/2020   AKI (acute kidney injury) ([place]) 03/17/2018   Anemia    Cancer (H[facility])    breast cancer right mastectomy   Diabetes mellitus without complication (HCC)    GIB (gastrointestinal bleeding) 02/20/2018   Glaucoma    Hyperkalemia 10/24/2020   Hyperlipidemia    Hypertension    Incontinence of bowel    Neuropathy    Odynophagia 10/24/2020   Osteoarthritis    Presence of permanent cardiac pacemaker    Rheumatoid arthritis ([place])    Syncope and collapse 10/24/2020   Telangiectasias     Past Surgical History:  Procedure Laterality Date   BREAST SURGERY     right mastectomy   CHOLECYSTECTOMY     COLONOSCOPY     COLONOSCOPY WITH PROPOFOL N/A 08/14/2016   Procedure: COLONOSCOPY WITH PROPOFOL;  Surgeon: Lollie Sails, MD;  Location: [facility] ENDOSCOPY;  Service: Endoscopy;  Laterality: N/A;   COLONOSCOPY WITH PROPOFOL N/A 02/22/2018   Procedure: COLONOSCOPY WITH PROPOFOL;  Surgeon: Jonathon Bellows, MD;  Location: [facility] ENDOSCOPY;  Service: Gastroenterology;  Laterality: N/A;   ENTEROSCOPY N/A 04/12/2021   Procedure: ENTEROSCOPY;  Surgeon: Lin Landsman, MD;  Location: [facility] ENDOSCOPY;  Service: Gastroenterology;  Laterality: N/A;  push    ESOPHAGOGASTRODUODENOSCOPY (EGD) WITH PROPOFOL N/A 08/14/2016   Procedure: ESOPHAGOGASTRODUODENOSCOPY (EGD) WITH PROPOFOL;  Surgeon: Lollie Sails, MD;  Location: [facility] ENDOSCOPY;  Service: Endoscopy;  Laterality: N/A;   ESOPHAGOGASTRODUODENOSCOPY (EGD) WITH PROPOFOL N/A 01/30/2018   Procedure: ESOPHAGOGASTRODUODENOSCOPY (EGD) WITH PROPOFOL;  Surgeon: Toledo, Benay Pike, MD;  Location: [facility] ENDOSCOPY;  Service: Gastroenterology;  Laterality: N/A;   ESOPHAGOGASTRODUODENOSCOPY (EGD) WITH PROPOFOL N/A 02/22/2018   Procedure: ESOPHAGOGASTRODUODENOSCOPY (EGD) WITH PROPOFOL;  Surgeon: Jonathon Bellows, MD;  Location: [facility] ENDOSCOPY;  Service: Gastroenterology;  Laterality: N/A;   EXCISIONAL HEMORRHOIDECTOMY     IR RADIOLOGIST EVAL & MGMT  08/07/2022   IR RADIOLOGIST EVAL & MGMT  09/12/2022   KYPHOPLASTY N/A 03/20/2018   Procedure: [id number];  Surgeon: Hessie Knows, MD;  Location: [facility] ORS;  Service: Orthopedics;  Laterality: N/A;   PACEMAKER IMPLANT N/A 10/26/2020   Procedure: PACEMAKER IMPLANT;  Surgeon: Isaias Cowman, MD;  Location: [place] CV LAB;  Service: Cardiovascular;  Laterality: N/A;   RADIOLOGY WITH ANESTHESIA N/A 08/31/2022   Procedure: CT renal microwave ablation;  Surgeon: Juliet Rude, MD;  Location: [place];  Service: Radiology;  Laterality: N/A;    Allergies: Patient has no known allergies.  Medications: Prior to Admission medications   Medication Sig Start Date End Date Taking? Authorizing Provider  ACCU-CHEK AVIVA PLUS test strip  03/28/18   [provider]  ACCU-CHEK SOFTCLIX LANCETS lancets once daily Use as instructed. 04/14/18   [provider]  acetaminophen (TYLENOL) 500 MG tablet Take 500 mg by  mouth every 8 (eight) hours as needed for moderate pain or mild pain.    [provider]  alendronate (FOSAMAX) 70 MG tablet TAKE 1 TABLET BY MOUTH EVERY 7DAYS WITH A FULL GLASS OF WATER. DO NOT LIE DOWN FOR 30 MINUTES 03/21/21   [provider]  Biotin 1000 MCG tablet Take 1,000 mcg by mouth daily.    [provider]  Calcium Carb-Cholecalciferol (OYSTER SHELL CALCIUM W/D) 500-5 MG-MCG TABS Take 1 tablet by mouth daily.    [provider]  Cholecalciferol 50 MCG (2000 UT) CAPS Take 2,000 Units by mouth daily.    [provider]  feeding supplement, ENSURE ENLIVE, (ENSURE ENLIVE) LIQD Take 237 mLs by mouth 2 (two) times daily between meals. Patient taking differently: Take 237 mLs by mouth 2 (two) times a week. 02/26/18   Fritzi Mandes, MD  insulin glargine (LANTUS) 100 UNIT/ML Solostar Pen Inject 26 Units into the skin daily. (may take up to 50u based upon blood glucose reading)    [provider]  Insulin Pen Needle 32G X 4 MM MISC USE 1 NEEDLE SUBCUTANEOUSLY ONCE A DAY AS DIRECTED 03/24/18   [provider]  losartan (COZAAR) 50 MG tablet Take 25 mg by mouth daily. 07/28/22   [provider]  Magnesium 100 MG CAPS Take 2 capsules by mouth daily. Glycinate    [provider]  metFORMIN (GLUCOPHAGE-XR) 500 MG 24 hr tablet Take 500 mg by mouth daily with breakfast. 09/18/19   [provider]  niacin 500 MG CR capsule Take 500 mg by mouth at bedtime.    [provider]  octreotide (SANDOSTATIN LAR) 10 MG injection Inject 10 mg into the muscle every 28 (twenty-eight) days. Inject 10 mg into the muscle every 28 (twenty-eight) days    [provider]  omeprazole (PRILOSEC) 40 MG capsule TAKE 1 CAPSULE BY MOUTH EVERY DAY BEFORE BREAKFAST 06/04/22   Vanga, Tally Due, MD  Probiotic Product (PROBIOTIC PO) Take 1 tablet by mouth daily. 2 billion    [provider]  simvastatin (ZOCOR) 20 MG tablet Take 20 mg by mouth at bedtime. 12/23/17   [provider]  sitaGLIPtin (JANUVIA) 50 MG tablet Take 50 mg by mouth daily.    [provider]  TRAVATAN Z 0.004 % SOLN ophthalmic solution Place 1 drop into both eyes at bedtime.  12/05/17   [provider]  TURMERIC PO Take 538 mg by mouth daily.    [provider]  vitamin B-12 (CYANOCOBALAMIN) 1000 MCG tablet Take 2,000 mcg by mouth daily.    [provider]     Family History  Problem Relation Age of Onset   Kidney cancer Son    Bladder Cancer Neg Hx     Social History   Socioeconomic History   Marital status: Widowed    Spouse name: Not on file   Number of children: Not on file   Years of education: Not on file   Highest education level: Not on file  Occupational History   Not on file  Tobacco Use   Smoking status: Former    Types: Cigarettes    Quit date: 07/10/2011    Years since quitting: 11.1   Smokeless tobacco: Never  Vaping Use   Vaping Use: Never used  Substance and Sexual Activity   Alcohol use: No   Drug use: No   Sexual activity: Not Currently  Other Topics Concern   Not on file  Social History  Narrative   Not on file   Social Determinants of Health   Financial Resource Strain: Not on file  Food Insecurity: No Food Insecurity (02/05/2022)   Hunger Vital Sign    Worried About Running Out of Food in the Last Year: Never true    Ran Out of Food in the Last Year: Never true  Transportation Needs: No Transportation Needs (02/05/2022)   PRAPARE - Hydrologist (Medical): No    Lack of Transportation (Non-Medical): No  Physical Activity: Not on file  Stress: Not on file  Social Connections: Not on file    ECOG Status: 0 - Asymptomatic  Review of Systems  Review of Systems: A 12 point ROS discussed and pertinent positives are indicated in the HPI above.  All other systems are negative.  Physical Exam No direct physical exam was performed (except for noted visual exam findings with Video Visits).    Vital Signs: There were no vitals taken for this visit.  Imaging: IR Radiologist Eval & Mgmt  Result Date: 09/12/2022 EXAM: NEW PATIENT OFFICE VISIT CHIEF COMPLAINT: SEE  EPIC NOTE HISTORY OF PRESENT ILLNESS: SEE EPIC NOTE REVIEW OF SYSTEMS: SEE EPIC NOTE PHYSICAL EXAMINATION: SEE EPIC NOTE ASSESSMENT AND PLAN: SEE EPIC NOTE Electronically Signed   By: Jacqulynn Cadet M.D.   On: 09/12/2022 14:25   CT GUIDE TISSUE ABLATION  Result Date: 08/31/2022 INDICATION: Left renal mass EXAM: 1. CT-guided microwave ablation of left renal mass 2. CT abdomen with and without IV contrast COMPARISON:  None MEDICATIONS: Documented in the EMR ANESTHESIA/SEDATION: General anesthesia - refer to separate anesthesia records FLUOROSCOPY: N/a COMPLICATIONS: None immediate. TECHNIQUE: Informed written consent was obtained from the patient after a thorough discussion of the procedural risks, benefits and alternatives. All questions were addressed. Maximal Sterile Barrier Technique was utilized including caps, mask, sterile gowns, sterile gloves, sterile drape, hand hygiene and skin antiseptic. A timeout was performed prior to the initiation of the procedure. The patient was placed prone on the exam table after the induction of general anesthesia. Limited noncontrasted CT of the abdomen was performed for planning purposes. This again demonstrated the relatively hyperdense mass in the left renal upper pole. Note was made adjacent left hemicolon. The decision was then made to proceed with hydro dissection to provide a safe plain for microwave ablation. Under intermittent CT fluoroscopy, an 18 gauge trocar needle was advanced into the fat plane between the mass in the left kidney in the left hemicolon. Approximately 500-600 mL of normal saline was then injected. Post hydro dissection CT imaging demonstrated expected displacement of the left hemicolon away from the lesion. Skin entry sites were then planned using a 2 probe approach (NeuWave PR probe 15 cm x2). Under intermittent CT fluoroscopy, the microwave probes were then placed within the center of the left renal mass in a parallel fashion. Location was  confirmed with CT. Microwave ablation was then performed at 65 watts for 5.5 minutes. Intermittent CT imaging during the ablation demonstrated appropriate formation of gas within the lesion. Upon completion of the ablation, the microwave probes were removed. Following ablation, CT of the abdomen with and without IV contrast was performed using a renal mass protocol. Imaging demonstrated expected postablation changes without residual enhancing tumor or complicating feature/hemorrhage. A clean dressing was placed at the skin entry sites. Patient tolerated the procedure well without immediate complication, and was transferred to the postanesthesia care unit in stable condition. FINDINGS: As above. IMPRESSION: 1. Successful CT-guided  microwave ablation of the left renal mass. 2. Postablation CT abdomen with and without IV contrast demonstrated no residual enhancing tumor. 3. Plan for follow-up with interventional radiology in 2-3 weeks, with follow-up posttreatment CT renal protocol in 6 months. Electronically Signed   By: Albin Felling M.D.   On: 08/31/2022 13:51    Labs:  CBC: Recent Labs    08/06/22 1336 08/27/22 1500 08/31/22 0711 09/01/22 1338  WBC 4.2 6.1 5.4 8.9  HGB 9.7* 9.8* 9.7* 9.6*  HCT 30.8* 30.9* 30.6* 30.6*  PLT 126* 134* 134* 142*    COAGS: Recent Labs    08/31/22 0711  INR 1.5*    BMP: Recent Labs    06/07/22 1400 08/27/22 1500 08/31/22 0711 09/01/22 1119  NA  --  135 138 136  K  --  4.1 4.0 4.9  CL  --  102 107 105  CO2  --  22 24 19*  GLUCOSE  --  309* 148* 363*  BUN  --  '10 9 20  '$ CALCIUM  --  9.0 8.8* 8.8*  CREATININE 1.30* 1.32* 1.28* 1.50*  GFRNONAA  --  41* 42* 35*    LIVER FUNCTION TESTS: Recent Labs    08/27/22 1500  BILITOT 1.0  AST 33  ALT 18  ALKPHOS 72  PROT 6.9  ALBUMIN 3.2*    TUMOR MARKERS: No results for input(s): "AFPTM", "CEA", "CA199", "CHROMGRNA" in the last 8760 hours.  Assessment and Plan:  Very pleasant 82 year old female  doing excellent 2 weeks status post percutaneous microwave ablation of left renal mass (putative papillary renal cell carcinoma).  Her recovery has been uncomplicated.  She has no complaints.  1.)  Follow-up CT abdomen with contrast in 6 months accompanied by a clinic visit with Dr. Denna Haggard.    Electronically Signed: Criselda Peaches 09/12/2022, 2:37 PM   I spent a total of  15 Minutes in remote  clinical consultation, greater than 50% of which was counseling/coordinating care for left renal mass, probable RCC.    Visit type: Audio only (telephone). Audio (no video) only due to patient preference. Alternative for in-person consultation at [facility], [address], [place], [place]. This visit type was conducted due to national recommendations for restrictions regarding the COVID-19 Pandemic (e.g. social distancing).  This format is felt to be most appropriate for this patient at this time.  All issues noted in this document were discussed and addressed.

## 2022-09-17 DIAGNOSIS — K7460 Unspecified cirrhosis of liver: Secondary | ICD-10-CM | POA: Diagnosis not present

## 2022-09-17 DIAGNOSIS — D508 Other iron deficiency anemias: Secondary | ICD-10-CM | POA: Diagnosis not present

## 2022-09-17 DIAGNOSIS — I700 Atherosclerosis of aorta: Secondary | ICD-10-CM | POA: Diagnosis not present

## 2022-09-17 DIAGNOSIS — E782 Mixed hyperlipidemia: Secondary | ICD-10-CM | POA: Diagnosis not present

## 2022-09-17 DIAGNOSIS — E1140 Type 2 diabetes mellitus with diabetic neuropathy, unspecified: Secondary | ICD-10-CM | POA: Diagnosis not present

## 2022-09-17 DIAGNOSIS — I272 Pulmonary hypertension, unspecified: Secondary | ICD-10-CM | POA: Diagnosis not present

## 2022-09-17 DIAGNOSIS — N1832 Chronic kidney disease, stage 3b: Secondary | ICD-10-CM | POA: Diagnosis not present

## 2022-09-17 DIAGNOSIS — I129 Hypertensive chronic kidney disease with stage 1 through stage 4 chronic kidney disease, or unspecified chronic kidney disease: Secondary | ICD-10-CM | POA: Diagnosis not present

## 2022-09-17 DIAGNOSIS — I442 Atrioventricular block, complete: Secondary | ICD-10-CM | POA: Diagnosis not present

## 2022-09-18 DIAGNOSIS — I442 Atrioventricular block, complete: Secondary | ICD-10-CM | POA: Diagnosis not present

## 2022-10-05 ENCOUNTER — Inpatient Hospital Stay: Payer: Medicare HMO | Attending: Oncology

## 2022-10-05 DIAGNOSIS — D649 Anemia, unspecified: Secondary | ICD-10-CM

## 2022-10-05 DIAGNOSIS — D500 Iron deficiency anemia secondary to blood loss (chronic): Secondary | ICD-10-CM | POA: Insufficient documentation

## 2022-10-05 DIAGNOSIS — K5520 Angiodysplasia of colon without hemorrhage: Secondary | ICD-10-CM | POA: Insufficient documentation

## 2022-10-05 DIAGNOSIS — D508 Other iron deficiency anemias: Secondary | ICD-10-CM

## 2022-10-05 LAB — CBC WITH DIFFERENTIAL/PLATELET
Abs Immature Granulocytes: 0.02 10*3/uL (ref 0.00–0.07)
Basophils Absolute: 0 10*3/uL (ref 0.0–0.1)
Basophils Relative: 1 %
Eosinophils Absolute: 0.1 10*3/uL (ref 0.0–0.5)
Eosinophils Relative: 4 %
HCT: 30.2 % — ABNORMAL LOW (ref 36.0–46.0)
Hemoglobin: 9.6 g/dL — ABNORMAL LOW (ref 12.0–15.0)
Immature Granulocytes: 1 %
Lymphocytes Relative: 15 %
Lymphs Abs: 0.5 10*3/uL — ABNORMAL LOW (ref 0.7–4.0)
MCH: 31.6 pg (ref 26.0–34.0)
MCHC: 31.8 g/dL (ref 30.0–36.0)
MCV: 99.3 fL (ref 80.0–100.0)
Monocytes Absolute: 0.3 10*3/uL (ref 0.1–1.0)
Monocytes Relative: 7 %
Neutro Abs: 2.7 10*3/uL (ref 1.7–7.7)
Neutrophils Relative %: 72 %
Platelets: 98 10*3/uL — ABNORMAL LOW (ref 150–400)
RBC: 3.04 MIL/uL — ABNORMAL LOW (ref 3.87–5.11)
RDW: 17.1 % — ABNORMAL HIGH (ref 11.5–15.5)
WBC: 3.6 10*3/uL — ABNORMAL LOW (ref 4.0–10.5)
nRBC: 0 % (ref 0.0–0.2)

## 2022-10-05 LAB — IRON AND TIBC
Iron: 39 ug/dL (ref 28–170)
Saturation Ratios: 14 % (ref 10.4–31.8)
TIBC: 279 ug/dL (ref 250–450)
UIBC: 240 ug/dL

## 2022-10-05 LAB — FERRITIN: Ferritin: 103 ng/mL (ref 11–307)

## 2022-10-08 DIAGNOSIS — D508 Other iron deficiency anemias: Secondary | ICD-10-CM | POA: Diagnosis not present

## 2022-10-26 ENCOUNTER — Other Ambulatory Visit: Payer: Self-pay | Admitting: Gastroenterology

## 2022-11-06 DIAGNOSIS — D508 Other iron deficiency anemias: Secondary | ICD-10-CM | POA: Diagnosis not present

## 2022-12-04 DIAGNOSIS — D508 Other iron deficiency anemias: Secondary | ICD-10-CM | POA: Diagnosis not present

## 2022-12-05 ENCOUNTER — Inpatient Hospital Stay: Payer: Medicare HMO | Attending: Oncology

## 2022-12-05 DIAGNOSIS — D508 Other iron deficiency anemias: Secondary | ICD-10-CM | POA: Diagnosis not present

## 2022-12-05 DIAGNOSIS — K5521 Angiodysplasia of colon with hemorrhage: Secondary | ICD-10-CM | POA: Diagnosis not present

## 2022-12-05 DIAGNOSIS — D500 Iron deficiency anemia secondary to blood loss (chronic): Secondary | ICD-10-CM | POA: Diagnosis not present

## 2022-12-05 DIAGNOSIS — D649 Anemia, unspecified: Secondary | ICD-10-CM

## 2022-12-05 LAB — IRON AND TIBC
Iron: 34 ug/dL (ref 28–170)
Saturation Ratios: 10 % — ABNORMAL LOW (ref 10.4–31.8)
TIBC: 354 ug/dL (ref 250–450)
UIBC: 320 ug/dL

## 2022-12-05 LAB — CBC WITH DIFFERENTIAL/PLATELET
Abs Immature Granulocytes: 0.01 10*3/uL (ref 0.00–0.07)
Basophils Absolute: 0 10*3/uL (ref 0.0–0.1)
Basophils Relative: 1 %
Eosinophils Absolute: 0.2 10*3/uL (ref 0.0–0.5)
Eosinophils Relative: 4 %
HCT: 28.1 % — ABNORMAL LOW (ref 36.0–46.0)
Hemoglobin: 9 g/dL — ABNORMAL LOW (ref 12.0–15.0)
Immature Granulocytes: 0 %
Lymphocytes Relative: 15 %
Lymphs Abs: 0.6 10*3/uL — ABNORMAL LOW (ref 0.7–4.0)
MCH: 31.3 pg (ref 26.0–34.0)
MCHC: 32 g/dL (ref 30.0–36.0)
MCV: 97.6 fL (ref 80.0–100.0)
Monocytes Absolute: 0.3 10*3/uL (ref 0.1–1.0)
Monocytes Relative: 8 %
Neutro Abs: 3 10*3/uL (ref 1.7–7.7)
Neutrophils Relative %: 72 %
Platelets: 105 10*3/uL — ABNORMAL LOW (ref 150–400)
RBC: 2.88 MIL/uL — ABNORMAL LOW (ref 3.87–5.11)
RDW: 14.3 % (ref 11.5–15.5)
WBC: 4.1 10*3/uL (ref 4.0–10.5)
nRBC: 0 % (ref 0.0–0.2)

## 2022-12-05 LAB — FERRITIN: Ferritin: 29 ng/mL (ref 11–307)

## 2022-12-07 ENCOUNTER — Telehealth: Payer: Self-pay

## 2022-12-07 NOTE — Telephone Encounter (Signed)
Spoke with patient and let her know that her iron was low per Dr. Smith Robert and that Dr. Smith Robert suggested Iv iron and the patient states that she wants to come in for Iv Iron and Emmit Alexanders is setting those iron treatments up for the patient today 12/07/22.The patient is to have 5 iv iron treatments.

## 2022-12-13 ENCOUNTER — Other Ambulatory Visit: Payer: Self-pay | Admitting: Oncology

## 2022-12-13 ENCOUNTER — Inpatient Hospital Stay: Payer: Medicare HMO | Attending: Oncology

## 2022-12-13 VITALS — BP 137/57 | HR 79 | Temp 98.9°F | Resp 17

## 2022-12-13 DIAGNOSIS — D508 Other iron deficiency anemias: Secondary | ICD-10-CM

## 2022-12-13 DIAGNOSIS — D500 Iron deficiency anemia secondary to blood loss (chronic): Secondary | ICD-10-CM | POA: Diagnosis not present

## 2022-12-13 DIAGNOSIS — K5521 Angiodysplasia of colon with hemorrhage: Secondary | ICD-10-CM | POA: Diagnosis not present

## 2022-12-13 MED ORDER — SODIUM CHLORIDE 0.9 % IV SOLN
Freq: Once | INTRAVENOUS | Status: AC
  Filled 2022-12-13: qty 250

## 2022-12-13 MED ORDER — SODIUM CHLORIDE 0.9 % IV SOLN
200.0000 mg | INTRAVENOUS | Status: DC
  Administered 2022-12-13: 200 mg via INTRAVENOUS
  Filled 2022-12-13: qty 200

## 2022-12-17 ENCOUNTER — Other Ambulatory Visit: Payer: Self-pay | Admitting: Gastroenterology

## 2022-12-17 ENCOUNTER — Inpatient Hospital Stay: Payer: Medicare HMO

## 2022-12-17 DIAGNOSIS — N1832 Chronic kidney disease, stage 3b: Secondary | ICD-10-CM | POA: Diagnosis not present

## 2022-12-17 DIAGNOSIS — N2889 Other specified disorders of kidney and ureter: Secondary | ICD-10-CM | POA: Diagnosis not present

## 2022-12-17 DIAGNOSIS — E1122 Type 2 diabetes mellitus with diabetic chronic kidney disease: Secondary | ICD-10-CM | POA: Diagnosis not present

## 2022-12-17 DIAGNOSIS — I10 Essential (primary) hypertension: Secondary | ICD-10-CM | POA: Diagnosis not present

## 2022-12-17 DIAGNOSIS — D631 Anemia in chronic kidney disease: Secondary | ICD-10-CM | POA: Diagnosis not present

## 2022-12-18 ENCOUNTER — Inpatient Hospital Stay: Payer: Medicare HMO

## 2022-12-18 VITALS — BP 126/48 | HR 80 | Temp 98.2°F | Resp 16

## 2022-12-18 DIAGNOSIS — D508 Other iron deficiency anemias: Secondary | ICD-10-CM

## 2022-12-18 DIAGNOSIS — K5521 Angiodysplasia of colon with hemorrhage: Secondary | ICD-10-CM | POA: Diagnosis not present

## 2022-12-18 DIAGNOSIS — D500 Iron deficiency anemia secondary to blood loss (chronic): Secondary | ICD-10-CM | POA: Diagnosis not present

## 2022-12-18 MED ORDER — SODIUM CHLORIDE 0.9 % IV SOLN
Freq: Once | INTRAVENOUS | Status: AC
  Filled 2022-12-18: qty 250

## 2022-12-18 MED ORDER — SODIUM CHLORIDE 0.9 % IV SOLN
200.0000 mg | INTRAVENOUS | Status: DC
  Administered 2022-12-18: 200 mg via INTRAVENOUS
  Filled 2022-12-18: qty 200

## 2022-12-18 NOTE — Progress Notes (Signed)
Patient declined to wait the 30 minutes for post iron infusion observation today. Tolerated infusion well. VSS. 

## 2022-12-19 ENCOUNTER — Inpatient Hospital Stay: Payer: Medicare HMO

## 2022-12-19 DIAGNOSIS — R9439 Abnormal result of other cardiovascular function study: Secondary | ICD-10-CM | POA: Diagnosis not present

## 2022-12-19 DIAGNOSIS — I340 Nonrheumatic mitral (valve) insufficiency: Secondary | ICD-10-CM | POA: Diagnosis not present

## 2022-12-19 DIAGNOSIS — I442 Atrioventricular block, complete: Secondary | ICD-10-CM | POA: Diagnosis not present

## 2022-12-19 DIAGNOSIS — E785 Hyperlipidemia, unspecified: Secondary | ICD-10-CM | POA: Diagnosis not present

## 2022-12-19 DIAGNOSIS — I517 Cardiomegaly: Secondary | ICD-10-CM | POA: Diagnosis not present

## 2022-12-19 DIAGNOSIS — E119 Type 2 diabetes mellitus without complications: Secondary | ICD-10-CM | POA: Diagnosis not present

## 2022-12-19 DIAGNOSIS — Z950 Presence of cardiac pacemaker: Secondary | ICD-10-CM | POA: Diagnosis not present

## 2022-12-19 DIAGNOSIS — N1832 Chronic kidney disease, stage 3b: Secondary | ICD-10-CM | POA: Diagnosis not present

## 2022-12-19 DIAGNOSIS — I10 Essential (primary) hypertension: Secondary | ICD-10-CM | POA: Diagnosis not present

## 2022-12-20 ENCOUNTER — Inpatient Hospital Stay: Payer: Medicare HMO

## 2022-12-20 VITALS — BP 131/78 | HR 82 | Temp 97.8°F | Resp 18

## 2022-12-20 DIAGNOSIS — K5521 Angiodysplasia of colon with hemorrhage: Secondary | ICD-10-CM | POA: Diagnosis not present

## 2022-12-20 DIAGNOSIS — D508 Other iron deficiency anemias: Secondary | ICD-10-CM

## 2022-12-20 DIAGNOSIS — D500 Iron deficiency anemia secondary to blood loss (chronic): Secondary | ICD-10-CM | POA: Diagnosis not present

## 2022-12-20 MED ORDER — SODIUM CHLORIDE 0.9 % IV SOLN
200.0000 mg | INTRAVENOUS | Status: DC
  Administered 2022-12-20: 200 mg via INTRAVENOUS
  Filled 2022-12-20: qty 200

## 2022-12-20 MED ORDER — SODIUM CHLORIDE 0.9 % IV SOLN
Freq: Once | INTRAVENOUS | Status: AC
  Filled 2022-12-20: qty 250

## 2022-12-20 NOTE — Patient Instructions (Signed)

## 2022-12-21 ENCOUNTER — Inpatient Hospital Stay: Payer: Medicare HMO

## 2022-12-21 DIAGNOSIS — E1142 Type 2 diabetes mellitus with diabetic polyneuropathy: Secondary | ICD-10-CM | POA: Diagnosis not present

## 2022-12-21 DIAGNOSIS — E1169 Type 2 diabetes mellitus with other specified complication: Secondary | ICD-10-CM | POA: Diagnosis not present

## 2022-12-21 DIAGNOSIS — Z794 Long term (current) use of insulin: Secondary | ICD-10-CM | POA: Diagnosis not present

## 2022-12-21 DIAGNOSIS — E785 Hyperlipidemia, unspecified: Secondary | ICD-10-CM | POA: Diagnosis not present

## 2022-12-21 DIAGNOSIS — M810 Age-related osteoporosis without current pathological fracture: Secondary | ICD-10-CM | POA: Diagnosis not present

## 2022-12-24 ENCOUNTER — Inpatient Hospital Stay: Payer: Medicare HMO

## 2022-12-24 VITALS — BP 130/53 | HR 74 | Temp 98.8°F | Resp 18

## 2022-12-24 DIAGNOSIS — D500 Iron deficiency anemia secondary to blood loss (chronic): Secondary | ICD-10-CM | POA: Diagnosis not present

## 2022-12-24 DIAGNOSIS — K5521 Angiodysplasia of colon with hemorrhage: Secondary | ICD-10-CM | POA: Diagnosis not present

## 2022-12-24 DIAGNOSIS — D508 Other iron deficiency anemias: Secondary | ICD-10-CM

## 2022-12-24 MED ORDER — SODIUM CHLORIDE 0.9 % IV SOLN
Freq: Once | INTRAVENOUS | Status: AC
  Filled 2022-12-24: qty 250

## 2022-12-24 MED ORDER — SODIUM CHLORIDE 0.9 % IV SOLN
200.0000 mg | INTRAVENOUS | Status: DC
  Administered 2022-12-24: 200 mg via INTRAVENOUS
  Filled 2022-12-24: qty 200

## 2022-12-24 NOTE — Progress Notes (Signed)
Pt has been educated and understands. Pt declined to stay 30 mins after iron infusion. VSS.  

## 2022-12-25 MED FILL — Iron Sucrose Inj 20 MG/ML (Fe Equiv): INTRAVENOUS | Qty: 10 | Status: AC

## 2022-12-26 ENCOUNTER — Inpatient Hospital Stay: Payer: Medicare HMO

## 2022-12-26 VITALS — BP 139/46 | HR 80 | Temp 98.8°F | Resp 17

## 2022-12-26 DIAGNOSIS — K5521 Angiodysplasia of colon with hemorrhage: Secondary | ICD-10-CM | POA: Diagnosis not present

## 2022-12-26 DIAGNOSIS — D500 Iron deficiency anemia secondary to blood loss (chronic): Secondary | ICD-10-CM | POA: Diagnosis not present

## 2022-12-26 DIAGNOSIS — D508 Other iron deficiency anemias: Secondary | ICD-10-CM

## 2022-12-26 MED ORDER — SODIUM CHLORIDE 0.9% FLUSH
10.0000 mL | Freq: Once | INTRAVENOUS | Status: AC | PRN
  Administered 2022-12-26: 10 mL
  Filled 2022-12-26: qty 10

## 2022-12-26 MED ORDER — SODIUM CHLORIDE 0.9 % IV SOLN
200.0000 mg | INTRAVENOUS | Status: DC
  Administered 2022-12-26: 200 mg via INTRAVENOUS
  Filled 2022-12-26: qty 200

## 2022-12-26 MED ORDER — SODIUM CHLORIDE 0.9 % IV SOLN
Freq: Once | INTRAVENOUS | Status: AC
  Filled 2022-12-26: qty 250

## 2022-12-26 NOTE — Progress Notes (Signed)
Patient tolerated Venofer infusion well. Explained recommendation of 30 min post monitoring. Patient refused to wait post monitoring. Educated on what signs to watch for & to call with any concerns. No questions, discharged. Stable  

## 2022-12-26 NOTE — Patient Instructions (Signed)

## 2023-01-03 DIAGNOSIS — D508 Other iron deficiency anemias: Secondary | ICD-10-CM | POA: Diagnosis not present

## 2023-01-21 ENCOUNTER — Other Ambulatory Visit: Payer: Self-pay | Admitting: Gastroenterology

## 2023-01-31 DIAGNOSIS — D508 Other iron deficiency anemias: Secondary | ICD-10-CM | POA: Diagnosis not present

## 2023-02-05 ENCOUNTER — Encounter: Payer: Self-pay | Admitting: Oncology

## 2023-02-05 ENCOUNTER — Inpatient Hospital Stay (HOSPITAL_BASED_OUTPATIENT_CLINIC_OR_DEPARTMENT_OTHER): Payer: Medicare HMO | Admitting: Oncology

## 2023-02-05 ENCOUNTER — Inpatient Hospital Stay: Payer: Medicare HMO | Attending: Oncology

## 2023-02-05 VITALS — BP 130/51 | HR 76 | Temp 96.9°F | Resp 18 | Ht 64.0 in | Wt 164.6 lb

## 2023-02-05 DIAGNOSIS — D508 Other iron deficiency anemias: Secondary | ICD-10-CM

## 2023-02-05 DIAGNOSIS — D500 Iron deficiency anemia secondary to blood loss (chronic): Secondary | ICD-10-CM | POA: Diagnosis not present

## 2023-02-05 DIAGNOSIS — D649 Anemia, unspecified: Secondary | ICD-10-CM

## 2023-02-05 DIAGNOSIS — K5521 Angiodysplasia of colon with hemorrhage: Secondary | ICD-10-CM | POA: Diagnosis not present

## 2023-02-05 LAB — CBC WITH DIFFERENTIAL/PLATELET
Abs Immature Granulocytes: 0.01 10*3/uL (ref 0.00–0.07)
Basophils Absolute: 0 10*3/uL (ref 0.0–0.1)
Basophils Relative: 1 %
Eosinophils Absolute: 0.2 10*3/uL (ref 0.0–0.5)
Eosinophils Relative: 5 %
HCT: 28.6 % — ABNORMAL LOW (ref 36.0–46.0)
Hemoglobin: 9.2 g/dL — ABNORMAL LOW (ref 12.0–15.0)
Immature Granulocytes: 0 %
Lymphocytes Relative: 15 %
Lymphs Abs: 0.6 10*3/uL — ABNORMAL LOW (ref 0.7–4.0)
MCH: 32.3 pg (ref 26.0–34.0)
MCHC: 32.2 g/dL (ref 30.0–36.0)
MCV: 100.4 fL — ABNORMAL HIGH (ref 80.0–100.0)
Monocytes Absolute: 0.3 10*3/uL (ref 0.1–1.0)
Monocytes Relative: 9 %
Neutro Abs: 2.6 10*3/uL (ref 1.7–7.7)
Neutrophils Relative %: 70 %
Platelets: 101 10*3/uL — ABNORMAL LOW (ref 150–400)
RBC: 2.85 MIL/uL — ABNORMAL LOW (ref 3.87–5.11)
RDW: 15.2 % (ref 11.5–15.5)
WBC: 3.8 10*3/uL — ABNORMAL LOW (ref 4.0–10.5)
nRBC: 0 % (ref 0.0–0.2)

## 2023-02-05 LAB — IRON AND TIBC
Iron: 31 ug/dL (ref 28–170)
Saturation Ratios: 10 % — ABNORMAL LOW (ref 10.4–31.8)
TIBC: 307 ug/dL (ref 250–450)
UIBC: 276 ug/dL

## 2023-02-05 LAB — FERRITIN: Ferritin: 71 ng/mL (ref 11–307)

## 2023-02-06 ENCOUNTER — Other Ambulatory Visit: Payer: Self-pay | Admitting: Oncology

## 2023-02-06 ENCOUNTER — Encounter: Payer: Self-pay | Admitting: Oncology

## 2023-02-06 ENCOUNTER — Telehealth: Payer: Self-pay | Admitting: *Deleted

## 2023-02-06 NOTE — Progress Notes (Signed)
Hematology/Oncology Consult note [facility]  [phone] [phone]  Patient Care Team: Marina Goodell, MD as PCP - General (Family Medicine) [name], MD as Consulting Physician (Oncology)   Name of the patient: Emily Faulkner  [id number]  Feb 01, 1941   Date of visit: 02/06/23  Diagnosis-iron deficiency anemia secondary to bleeding AVM  Chief complaint/ Reason for visit-routine follow-up of iron deficiency anemia  Heme/Onc history: Patient is a 82 year old female with a past medical history significant for stage III CKD, hypertension diabetes and left renal mass who was found to have a hemoglobin of 8.7 at nephrology office and hence referred the patient for anemia.  For a renal mass she has been followed by Dr. Apolinar Junes in the masses thought to be slow-growing and conservative management was recommended.  Patient presently reports ongoing fatigue.  Denies any blood loss in her stool or urine.  Denies any dark melanotic stools.  Patient has had a complete GI work-up including endoscopy colonoscopy and small bowel endoscopy in 2019 by Dr. Tobi Bastos colonoscopy showed multiple bleeding colonic angioectasias treated with APC small bowel endoscopy showed nonbleeding angioectasias in the jejunum treated with APC   Patient found to have significant iron deficiency anemia with a hemoglobin of 6.9 requiring IV iron.  Also seen by Dr. Allegra Lai and underwent push enteroscopy which did not show any evidence of active bleeding but showed chronic atrophic gastritis   Given persistent iron deficiency anemia due to bleeding AVMs patient was also started on monthly octreotide  Interval history-patient has baseline fatigue.  She has a significant co-pay associated with her monthly octreotide injections.  Denies any blood loss in her stool or urine.  ECOG PS- 2-3 Pain scale- 0   Review of systems- Review of Systems  Constitutional:  Positive for  malaise/fatigue. Negative for chills, fever and weight loss.  HENT:  Negative for congestion, ear discharge and nosebleeds.   Eyes:  Negative for blurred vision.  Respiratory:  Negative for cough, hemoptysis, sputum production, shortness of breath and wheezing.   Cardiovascular:  Negative for chest pain, palpitations, orthopnea and claudication.  Gastrointestinal:  Negative for abdominal pain, blood in stool, constipation, diarrhea, heartburn, melena, nausea and vomiting.  Genitourinary:  Negative for dysuria, flank pain, frequency, hematuria and urgency.  Musculoskeletal:  Negative for back pain, joint pain and myalgias.  Skin:  Negative for rash.  Neurological:  Negative for dizziness, tingling, focal weakness, seizures, weakness and headaches.  Endo/Heme/Allergies:  Does not bruise/bleed easily.  Psychiatric/Behavioral:  Negative for depression and suicidal ideas. The patient does not have insomnia.       No Known Allergies   Past Medical History:  Diagnosis Date   Acute kidney failure (HCC) 11/16/2020   AKI (acute kidney injury) (HCC) 03/17/2018   Anemia    Cancer (HCC)    breast cancer right mastectomy   Diabetes mellitus without complication (HCC)    GIB (gastrointestinal bleeding) 02/20/2018   Glaucoma    Hyperkalemia 10/24/2020   Hyperlipidemia    Hypertension    Incontinence of bowel    Neuropathy    Odynophagia 10/24/2020   Osteoarthritis    Presence of permanent cardiac pacemaker    Rheumatoid arthritis (HCC)    Syncope and collapse 10/24/2020   Telangiectasias      Past Surgical History:  Procedure Laterality Date   BREAST SURGERY     right mastectomy   CHOLECYSTECTOMY     COLONOSCOPY     COLONOSCOPY WITH  PROPOFOL N/A 08/14/2016   Procedure: COLONOSCOPY WITH PROPOFOL;  Surgeon: Christena Deem, MD;  Location: [facility] ENDOSCOPY;  Service: Endoscopy;  Laterality: N/A;   COLONOSCOPY WITH PROPOFOL N/A 02/22/2018   Procedure: COLONOSCOPY WITH PROPOFOL;  Surgeon:  Wyline Mood, MD;  Location: [facility] ENDOSCOPY;  Service: Gastroenterology;  Laterality: N/A;   ENTEROSCOPY N/A 04/12/2021   Procedure: ENTEROSCOPY;  Surgeon: Toney Reil, MD;  Location: [facility] ENDOSCOPY;  Service: Gastroenterology;  Laterality: N/A;  push   ESOPHAGOGASTRODUODENOSCOPY (EGD) WITH PROPOFOL N/A 08/14/2016   Procedure: ESOPHAGOGASTRODUODENOSCOPY (EGD) WITH PROPOFOL;  Surgeon: Christena Deem, MD;  Location: [facility] ENDOSCOPY;  Service: Endoscopy;  Laterality: N/A;   ESOPHAGOGASTRODUODENOSCOPY (EGD) WITH PROPOFOL N/A 01/30/2018   Procedure: ESOPHAGOGASTRODUODENOSCOPY (EGD) WITH PROPOFOL;  Surgeon: Toledo, Boykin Nearing, MD;  Location: [facility] ENDOSCOPY;  Service: Gastroenterology;  Laterality: N/A;   ESOPHAGOGASTRODUODENOSCOPY (EGD) WITH PROPOFOL N/A 02/22/2018   Procedure: ESOPHAGOGASTRODUODENOSCOPY (EGD) WITH PROPOFOL;  Surgeon: Wyline Mood, MD;  Location: [facility] ENDOSCOPY;  Service: Gastroenterology;  Laterality: N/A;   EXCISIONAL HEMORRHOIDECTOMY     IR RADIOLOGIST EVAL & MGMT  08/07/2022   IR RADIOLOGIST EVAL & MGMT  09/12/2022   KYPHOPLASTY N/A 03/20/2018   Procedure: [id number];  Surgeon: Kennedy Bucker, MD;  Location: [facility] ORS;  Service: Orthopedics;  Laterality: N/A;   PACEMAKER IMPLANT N/A 10/26/2020   Procedure: PACEMAKER IMPLANT;  Surgeon: Marcina Millard, MD;  Location: [facility] INVASIVE CV LAB;  Service: Cardiovascular;  Laterality: N/A;   RADIOLOGY WITH ANESTHESIA N/A 08/31/2022   Procedure: CT renal microwave ablation;  Surgeon: Pernell Dupre, MD;  Location: [facility] OR;  Service: Radiology;  Laterality: N/A;    Social History   Socioeconomic History   Marital status: Widowed    Spouse name: Not on file   Number of children: Not on file   Years of education: Not on file   Highest education level: Not on file  Occupational History   Not on file  Tobacco Use   Smoking status: Former    Current packs/day: 0.00    Types: Cigarettes    Quit date: 07/10/2011    Years since  quitting: 11.5   Smokeless tobacco: Never  Vaping Use   Vaping status: Never Used  Substance and Sexual Activity   Alcohol use: No   Drug use: No   Sexual activity: Not Currently  Other Topics Concern   Not on file  Social History Narrative   Not on file   Social Determinants of Health   Financial Resource Strain: Not on file  Food Insecurity: No Food Insecurity (02/05/2022)   Hunger Vital Sign    Worried About Running Out of Food in the Last Year: Never true    Ran Out of Food in the Last Year: Never true  Transportation Needs: No Transportation Needs (02/05/2022)   PRAPARE - Administrator, Civil Service (Medical): No    Lack of Transportation (Non-Medical): No  Physical Activity: Not on file  Stress: Not on file  Social Connections: Not on file  Intimate Partner Violence: Not on file    Family History  Problem Relation Age of Onset   Kidney cancer Son    Bladder Cancer Neg Hx      Current Outpatient Medications:    ACCU-CHEK AVIVA PLUS test strip, , Disp: , Rfl:    ACCU-CHEK SOFTCLIX LANCETS lancets, once daily Use as instructed., Disp: , Rfl:    acetaminophen (TYLENOL) 500 MG tablet, Take 500 mg by mouth every 8 (eight) hours  as needed for moderate pain or mild pain., Disp: , Rfl:    alendronate (FOSAMAX) 70 MG tablet, TAKE 1 TABLET BY MOUTH EVERY 7DAYS WITH A FULL GLASS OF WATER. DO NOT LIE DOWN FOR 30 MINUTES, Disp: , Rfl:    Biotin 1000 MCG tablet, Take 1,000 mcg by mouth daily., Disp: , Rfl:    Calcium Carb-Cholecalciferol (OYSTER SHELL CALCIUM W/D) 500-5 MG-MCG TABS, Take 1 tablet by mouth daily., Disp: , Rfl:    Cholecalciferol 50 MCG (2000 UT) CAPS, Take 2,000 Units by mouth daily., Disp: , Rfl:    feeding supplement, ENSURE ENLIVE, (ENSURE ENLIVE) LIQD, Take 237 mLs by mouth 2 (two) times daily between meals. (Patient taking differently: Take 237 mLs by mouth 2 (two) times a week.), Disp: 237 mL, Rfl: 12   insulin glargine (LANTUS) 100 UNIT/ML  Solostar Pen, Inject 26 Units into the skin daily. (may take up to 50u based upon blood glucose reading), Disp: , Rfl:    Insulin Pen Needle 32G X 4 MM MISC, USE 1 NEEDLE SUBCUTANEOUSLY ONCE A DAY AS DIRECTED, Disp: , Rfl:    losartan (COZAAR) 50 MG tablet, Take 25 mg by mouth daily., Disp: , Rfl:    Magnesium 100 MG CAPS, Take 2 capsules by mouth daily. Glycinate, Disp: , Rfl:    metFORMIN (GLUCOPHAGE-XR) 500 MG 24 hr tablet, Take 500 mg by mouth daily with breakfast., Disp: , Rfl:    niacin 500 MG CR capsule, Take 500 mg by mouth at bedtime., Disp: , Rfl:    octreotide (SANDOSTATIN LAR) 10 MG injection, Inject 10 mg into the muscle every 28 (twenty-eight) days. Inject 10 mg into the muscle every 28 (twenty-eight) days, Disp: , Rfl:    omeprazole (PRILOSEC) 40 MG capsule, TAKE 1 CAPSULE BY MOUTH EVERY DAY BEFORE BREAKFAST, Disp: 90 capsule, Rfl: 0   Probiotic Product (PROBIOTIC PO), Take 1 tablet by mouth daily. 2 billion, Disp: , Rfl:    simvastatin (ZOCOR) 20 MG tablet, Take 20 mg by mouth at bedtime., Disp: , Rfl: 6   sitaGLIPtin (JANUVIA) 50 MG tablet, Take 50 mg by mouth daily., Disp: , Rfl:    TRAVATAN Z 0.004 % SOLN ophthalmic solution, Place 1 drop into both eyes at bedtime., Disp: , Rfl:    TURMERIC PO, Take 538 mg by mouth daily., Disp: , Rfl:    vitamin B-12 (CYANOCOBALAMIN) 1000 MCG tablet, Take 2,000 mcg by mouth daily., Disp: , Rfl:  No current facility-administered medications for this visit.  Facility-Administered Medications Ordered in Other Visits:    0.9 %  sodium chloride infusion, , Intravenous, Continuous, [name], MD, Last Rate: 200 mL/hr at 08/31/22 1133, New Bag at 08/31/22 1133   alteplase (CATHFLO ACTIVASE) injection 2 mg, 2 mg, Intracatheter, Once PRN, Orlie Dakin, Tollie Pizza, MD   octreotide (SANDOSTATIN LAR) IM injection 20 mg, 20 mg, Intramuscular, Q28 days, [name], MD, 20 mg at 11/03/21 1217   octreotide (SANDOSTATIN LAR) IM injection 20 mg, 20 mg,  Intramuscular, Q28 days, [name], MD, 20 mg at 02/02/22 1210  Physical exam:  Vitals:   02/05/23 1403  BP: (!) 130/51  Pulse: 76  Resp: 18  Temp: (!) 96.9 F (36.1 C)  TempSrc: Tympanic  SpO2: 96%  Weight: 164 lb 9.6 oz (74.7 kg)  Height: 5\' 4"  (1.626 m)   Physical Exam Constitutional:      Comments: Elderly frail woman sitting in a wheelchair.  Appears in no acute distress  Cardiovascular:  Rate and Rhythm: Normal rate and regular rhythm.     Heart sounds: Normal heart sounds.  Pulmonary:     Effort: Pulmonary effort is normal.     Breath sounds: Normal breath sounds.  Skin:    General: Skin is warm and dry.  Neurological:     Mental Status: She is alert and oriented to person, place, and time.         Latest Ref Rng & Units 09/01/2022   11:19 AM  CMP  Glucose 70 - 99 mg/dL 161   BUN 8 - 23 mg/dL 20   Creatinine 0.96 - 1.00 mg/dL 0.45   Sodium 409 - 811 mmol/L 136   Potassium 3.5 - 5.1 mmol/L 4.9   Chloride 98 - 111 mmol/L 105   CO2 22 - 32 mmol/L 19   Calcium 8.9 - 10.3 mg/dL 8.8       Latest Ref Rng & Units 02/05/2023    1:50 PM  CBC  WBC 4.0 - 10.5 K/uL 3.8   Hemoglobin 12.0 - 15.0 g/dL 9.2   Hematocrit 91.4 - 46.0 % 28.6   Platelets 150 - 400 K/uL 101      Assessment and plan- Patient is a 82 y.o. female here for routine follow-up of iron Deficiency anemia  Patient's hemoglobin has been stable around 9 for the last 6 to 7 months.  At baseline her hemoglobin is around 10-11.  She does have anemia of chronic disease as well given her underlying cirrhosis.  Labs today are indicative of iron deficiency given that her iron saturation is still low at 10% although ferritin levels are normal at 71.  I think it would be reasonable to offer her IV iron at this time.  She has received Venofer in the past.  Repeat CBC ferritin and iron studies in 3 and 6 months and I will see her back in 6 months.  Given the significant co-pay associated with Dr. Randa Lynn it  would be okay for her to hold off on taking octreotide at this time.  If there is a significant drop in her hemoglobin after stopping octreotide we will consider restarting it again at that time   Visit Diagnosis 1. Iron deficiency anemia secondary to inadequate dietary iron intake      Dr. Owens Shark, MD, MPH [facility] [phone] 02/06/2023 8:49 AM

## 2023-02-06 NOTE — Telephone Encounter (Signed)
-----   Message from [name] sent at 02/05/2023  4:39 PM EDT ----- Needs iv iron

## 2023-02-06 NOTE — Telephone Encounter (Signed)
I called to say that the iron labs are low and dr Smith Robert has sent  a message to see if she wants to get iv iron. I checked with the insurance and it coveres venofer iron. Left my direct phone numbe [phone]. Wait for call back

## 2023-02-08 ENCOUNTER — Telehealth: Payer: Self-pay | Admitting: *Deleted

## 2023-02-08 NOTE — Telephone Encounter (Addendum)
Called the pt. Again and she answered , her phone was not working so I spoke to son and he would try to get her. Phones still did not work for son. I got in touch with pt and she will get iv iron and I told her we will gt it set up and call her for the dates to start. She is ok with this. Also I asked her about the octreotide inj. That she gets at her home every 1 month. She states that she called the company and told them to stop the inj. Until the pt/ MD wants to start again if needed. I called the company and no answer, so I faxed the paper to say no more octreotide inj. The fax transmission went through

## 2023-02-20 ENCOUNTER — Inpatient Hospital Stay: Payer: Medicare HMO | Attending: Oncology

## 2023-02-20 VITALS — BP 134/45 | HR 76 | Temp 98.8°F

## 2023-02-20 DIAGNOSIS — D500 Iron deficiency anemia secondary to blood loss (chronic): Secondary | ICD-10-CM | POA: Insufficient documentation

## 2023-02-20 DIAGNOSIS — K5521 Angiodysplasia of colon with hemorrhage: Secondary | ICD-10-CM | POA: Insufficient documentation

## 2023-02-20 DIAGNOSIS — D508 Other iron deficiency anemias: Secondary | ICD-10-CM

## 2023-02-20 MED ORDER — SODIUM CHLORIDE 0.9 % IV SOLN
Freq: Once | INTRAVENOUS | Status: AC
  Filled 2023-02-20: qty 250

## 2023-02-20 MED ORDER — SODIUM CHLORIDE 0.9 % IV SOLN
200.0000 mg | INTRAVENOUS | Status: DC
  Administered 2023-02-20: 200 mg via INTRAVENOUS
  Filled 2023-02-20: qty 200

## 2023-02-20 NOTE — Patient Instructions (Signed)
 Iron Sucrose Injection What is this medication? IRON SUCROSE (EYE ern SOO krose) treats low levels of iron (iron deficiency anemia) in people with kidney disease. Iron is a mineral that plays an important role in making red blood cells, which carry oxygen from your lungs to the rest of your body. This medicine may be used for other purposes; ask your health care provider or pharmacist if you have questions. COMMON BRAND NAME(S): Venofer What should I tell my care team before I take this medication? They need to know if you have any of these conditions: Anemia not caused by low iron levels Heart disease High levels of iron in the blood Kidney disease Liver disease An unusual or allergic reaction to iron, other medications, foods, dyes, or preservatives Pregnant or trying to get pregnant Breastfeeding How should I use this medication? This medication is for infusion into a vein. It is given in a hospital or clinic setting. Talk to your care team about the use of this medication in children. While this medication may be prescribed for children as young as 2 years for selected conditions, precautions do apply. Overdosage: If you think you have taken too much of this medicine contact a poison control center or emergency room at once. NOTE: This medicine is only for you. Do not share this medicine with others. What if I miss a dose? Keep appointments for follow-up doses. It is important not to miss your dose. Call your care team if you are unable to keep an appointment. What may interact with this medication? Do not take this medication with any of the following: Deferoxamine Dimercaprol Other iron products This medication may also interact with the following: Chloramphenicol Deferasirox This list may not describe all possible interactions. Give your health care provider a list of all the medicines, herbs, non-prescription drugs, or dietary supplements you use. Also tell them if you smoke,  drink alcohol, or use illegal drugs. Some items may interact with your medicine. What should I watch for while using this medication? Visit your care team regularly. Tell your care team if your symptoms do not start to get better or if they get worse. You may need blood work done while you are taking this medication. You may need to follow a special diet. Talk to your care team. Foods that contain iron include: whole grains/cereals, dried fruits, beans, or peas, leafy green vegetables, and organ meats (liver, kidney). What side effects may I notice from receiving this medication? Side effects that you should report to your care team as soon as possible: Allergic reactions--skin rash, itching, hives, swelling of the face, lips, tongue, or throat Low blood pressure--dizziness, feeling faint or lightheaded, blurry vision Shortness of breath Side effects that usually do not require medical attention (report to your care team if they continue or are bothersome): Flushing Headache Joint pain Muscle pain Nausea Pain, redness, or irritation at injection site This list may not describe all possible side effects. Call your doctor for medical advice about side effects. You may report side effects to FDA at 1-800-FDA-1088. Where should I keep my medication? This medication is given in a hospital or clinic. It will not be stored at home. NOTE: This sheet is a summary. It may not cover all possible information. If you have questions about this medicine, talk to your doctor, pharmacist, or health care provider.  2024 Elsevier/Gold Standard (2022-11-30 00:00:00)

## 2023-02-24 ENCOUNTER — Other Ambulatory Visit: Payer: Self-pay | Admitting: Gastroenterology

## 2023-02-25 ENCOUNTER — Inpatient Hospital Stay: Payer: Medicare HMO

## 2023-02-25 VITALS — BP 123/40 | HR 76 | Temp 98.8°F

## 2023-02-25 DIAGNOSIS — D500 Iron deficiency anemia secondary to blood loss (chronic): Secondary | ICD-10-CM | POA: Diagnosis not present

## 2023-02-25 DIAGNOSIS — K5521 Angiodysplasia of colon with hemorrhage: Secondary | ICD-10-CM | POA: Diagnosis not present

## 2023-02-25 DIAGNOSIS — D508 Other iron deficiency anemias: Secondary | ICD-10-CM

## 2023-02-25 MED ORDER — SODIUM CHLORIDE 0.9 % IV SOLN
200.0000 mg | INTRAVENOUS | Status: DC
  Administered 2023-02-25: 200 mg via INTRAVENOUS
  Filled 2023-02-25: qty 200

## 2023-02-25 MED ORDER — SODIUM CHLORIDE 0.9 % IV SOLN
Freq: Once | INTRAVENOUS | Status: AC
  Filled 2023-02-25: qty 250

## 2023-02-25 NOTE — Patient Instructions (Signed)
 Iron Sucrose Injection What is this medication? IRON SUCROSE (EYE ern SOO krose) treats low levels of iron (iron deficiency anemia) in people with kidney disease. Iron is a mineral that plays an important role in making red blood cells, which carry oxygen from your lungs to the rest of your body. This medicine may be used for other purposes; ask your health care provider or pharmacist if you have questions. COMMON BRAND NAME(S): Venofer What should I tell my care team before I take this medication? They need to know if you have any of these conditions: Anemia not caused by low iron levels Heart disease High levels of iron in the blood Kidney disease Liver disease An unusual or allergic reaction to iron, other medications, foods, dyes, or preservatives Pregnant or trying to get pregnant Breastfeeding How should I use this medication? This medication is for infusion into a vein. It is given in a hospital or clinic setting. Talk to your care team about the use of this medication in children. While this medication may be prescribed for children as young as 2 years for selected conditions, precautions do apply. Overdosage: If you think you have taken too much of this medicine contact a poison control center or emergency room at once. NOTE: This medicine is only for you. Do not share this medicine with others. What if I miss a dose? Keep appointments for follow-up doses. It is important not to miss your dose. Call your care team if you are unable to keep an appointment. What may interact with this medication? Do not take this medication with any of the following: Deferoxamine Dimercaprol Other iron products This medication may also interact with the following: Chloramphenicol Deferasirox This list may not describe all possible interactions. Give your health care provider a list of all the medicines, herbs, non-prescription drugs, or dietary supplements you use. Also tell them if you smoke,  drink alcohol, or use illegal drugs. Some items may interact with your medicine. What should I watch for while using this medication? Visit your care team regularly. Tell your care team if your symptoms do not start to get better or if they get worse. You may need blood work done while you are taking this medication. You may need to follow a special diet. Talk to your care team. Foods that contain iron include: whole grains/cereals, dried fruits, beans, or peas, leafy green vegetables, and organ meats (liver, kidney). What side effects may I notice from receiving this medication? Side effects that you should report to your care team as soon as possible: Allergic reactions--skin rash, itching, hives, swelling of the face, lips, tongue, or throat Low blood pressure--dizziness, feeling faint or lightheaded, blurry vision Shortness of breath Side effects that usually do not require medical attention (report to your care team if they continue or are bothersome): Flushing Headache Joint pain Muscle pain Nausea Pain, redness, or irritation at injection site This list may not describe all possible side effects. Call your doctor for medical advice about side effects. You may report side effects to FDA at 1-800-FDA-1088. Where should I keep my medication? This medication is given in a hospital or clinic. It will not be stored at home. NOTE: This sheet is a summary. It may not cover all possible information. If you have questions about this medicine, talk to your doctor, pharmacist, or health care provider.  2024 Elsevier/Gold Standard (2022-11-30 00:00:00)

## 2023-02-27 ENCOUNTER — Inpatient Hospital Stay: Payer: Medicare HMO

## 2023-02-27 DIAGNOSIS — H401222 Low-tension glaucoma, left eye, moderate stage: Secondary | ICD-10-CM | POA: Diagnosis not present

## 2023-02-28 ENCOUNTER — Inpatient Hospital Stay: Payer: Medicare HMO

## 2023-02-28 VITALS — BP 132/41 | HR 71

## 2023-02-28 DIAGNOSIS — K5521 Angiodysplasia of colon with hemorrhage: Secondary | ICD-10-CM | POA: Diagnosis not present

## 2023-02-28 DIAGNOSIS — D508 Other iron deficiency anemias: Secondary | ICD-10-CM

## 2023-02-28 DIAGNOSIS — D500 Iron deficiency anemia secondary to blood loss (chronic): Secondary | ICD-10-CM | POA: Diagnosis not present

## 2023-02-28 MED ORDER — SODIUM CHLORIDE 0.9% FLUSH
10.0000 mL | Freq: Once | INTRAVENOUS | Status: AC | PRN
  Administered 2023-02-28: 10 mL
  Filled 2023-02-28: qty 10

## 2023-02-28 MED ORDER — SODIUM CHLORIDE 0.9 % IV SOLN
Freq: Once | INTRAVENOUS | Status: AC
  Filled 2023-02-28: qty 250

## 2023-02-28 MED ORDER — SODIUM CHLORIDE 0.9 % IV SOLN
200.0000 mg | INTRAVENOUS | Status: DC
  Administered 2023-02-28: 200 mg via INTRAVENOUS
  Filled 2023-02-28: qty 200

## 2023-02-28 NOTE — Patient Instructions (Signed)
 Iron Sucrose Injection What is this medication? IRON SUCROSE (EYE ern SOO krose) treats low levels of iron (iron deficiency anemia) in people with kidney disease. Iron is a mineral that plays an important role in making red blood cells, which carry oxygen from your lungs to the rest of your body. This medicine may be used for other purposes; ask your health care provider or pharmacist if you have questions. COMMON BRAND NAME(S): Venofer What should I tell my care team before I take this medication? They need to know if you have any of these conditions: Anemia not caused by low iron levels Heart disease High levels of iron in the blood Kidney disease Liver disease An unusual or allergic reaction to iron, other medications, foods, dyes, or preservatives Pregnant or trying to get pregnant Breastfeeding How should I use this medication? This medication is for infusion into a vein. It is given in a hospital or clinic setting. Talk to your care team about the use of this medication in children. While this medication may be prescribed for children as young as 2 years for selected conditions, precautions do apply. Overdosage: If you think you have taken too much of this medicine contact a poison control center or emergency room at once. NOTE: This medicine is only for you. Do not share this medicine with others. What if I miss a dose? Keep appointments for follow-up doses. It is important not to miss your dose. Call your care team if you are unable to keep an appointment. What may interact with this medication? Do not take this medication with any of the following: Deferoxamine Dimercaprol Other iron products This medication may also interact with the following: Chloramphenicol Deferasirox This list may not describe all possible interactions. Give your health care provider a list of all the medicines, herbs, non-prescription drugs, or dietary supplements you use. Also tell them if you smoke,  drink alcohol, or use illegal drugs. Some items may interact with your medicine. What should I watch for while using this medication? Visit your care team regularly. Tell your care team if your symptoms do not start to get better or if they get worse. You may need blood work done while you are taking this medication. You may need to follow a special diet. Talk to your care team. Foods that contain iron include: whole grains/cereals, dried fruits, beans, or peas, leafy green vegetables, and organ meats (liver, kidney). What side effects may I notice from receiving this medication? Side effects that you should report to your care team as soon as possible: Allergic reactions--skin rash, itching, hives, swelling of the face, lips, tongue, or throat Low blood pressure--dizziness, feeling faint or lightheaded, blurry vision Shortness of breath Side effects that usually do not require medical attention (report to your care team if they continue or are bothersome): Flushing Headache Joint pain Muscle pain Nausea Pain, redness, or irritation at injection site This list may not describe all possible side effects. Call your doctor for medical advice about side effects. You may report side effects to FDA at 1-800-FDA-1088. Where should I keep my medication? This medication is given in a hospital or clinic. It will not be stored at home. NOTE: This sheet is a summary. It may not cover all possible information. If you have questions about this medicine, talk to your doctor, pharmacist, or health care provider.  2024 Elsevier/Gold Standard (2022-11-30 00:00:00)

## 2023-02-28 NOTE — Progress Notes (Addendum)
Patient tolerated Venofer infusion well. Explained recommendation of 30 min post monitoring. Patient refused to wait post monitoring. Educated on what signs to watch for & to call with any concerns. No questions, discharged. Stable  

## 2023-03-01 ENCOUNTER — Inpatient Hospital Stay: Payer: Medicare HMO

## 2023-03-04 ENCOUNTER — Inpatient Hospital Stay: Payer: Medicare HMO

## 2023-03-04 VITALS — BP 124/42 | HR 79 | Temp 98.5°F | Resp 16

## 2023-03-04 DIAGNOSIS — D508 Other iron deficiency anemias: Secondary | ICD-10-CM

## 2023-03-04 DIAGNOSIS — K5521 Angiodysplasia of colon with hemorrhage: Secondary | ICD-10-CM | POA: Diagnosis not present

## 2023-03-04 DIAGNOSIS — D500 Iron deficiency anemia secondary to blood loss (chronic): Secondary | ICD-10-CM | POA: Diagnosis not present

## 2023-03-04 MED ORDER — SODIUM CHLORIDE 0.9 % IV SOLN
Freq: Once | INTRAVENOUS | Status: AC
  Filled 2023-03-04: qty 250

## 2023-03-04 MED ORDER — SODIUM CHLORIDE 0.9 % IV SOLN
200.0000 mg | INTRAVENOUS | Status: DC
  Administered 2023-03-04: 200 mg via INTRAVENOUS
  Filled 2023-03-04: qty 200

## 2023-03-04 NOTE — Progress Notes (Signed)
Pt has been educated and understands. Pt declined to stay 30 mins after iron infusion.

## 2023-03-06 DIAGNOSIS — I2510 Atherosclerotic heart disease of native coronary artery without angina pectoris: Secondary | ICD-10-CM | POA: Diagnosis not present

## 2023-03-06 DIAGNOSIS — I442 Atrioventricular block, complete: Secondary | ICD-10-CM | POA: Diagnosis not present

## 2023-03-06 DIAGNOSIS — I272 Pulmonary hypertension, unspecified: Secondary | ICD-10-CM | POA: Diagnosis not present

## 2023-03-07 ENCOUNTER — Other Ambulatory Visit: Payer: Self-pay | Admitting: Nurse Practitioner

## 2023-03-07 ENCOUNTER — Inpatient Hospital Stay: Payer: Medicare HMO

## 2023-03-07 VITALS — BP 128/43 | HR 79 | Temp 99.2°F

## 2023-03-07 DIAGNOSIS — D500 Iron deficiency anemia secondary to blood loss (chronic): Secondary | ICD-10-CM | POA: Diagnosis not present

## 2023-03-07 DIAGNOSIS — D508 Other iron deficiency anemias: Secondary | ICD-10-CM

## 2023-03-07 DIAGNOSIS — K5521 Angiodysplasia of colon with hemorrhage: Secondary | ICD-10-CM | POA: Diagnosis not present

## 2023-03-07 MED ORDER — SODIUM CHLORIDE 0.9 % IV SOLN
200.0000 mg | Freq: Once | INTRAVENOUS | Status: AC
  Administered 2023-03-07: 200 mg via INTRAVENOUS
  Filled 2023-03-07: qty 200

## 2023-03-07 MED ORDER — SODIUM CHLORIDE 0.9 % IV SOLN
Freq: Once | INTRAVENOUS | Status: AC
  Filled 2023-03-07: qty 250

## 2023-03-21 ENCOUNTER — Other Ambulatory Visit: Payer: Self-pay | Admitting: Gastroenterology

## 2023-03-25 ENCOUNTER — Other Ambulatory Visit: Payer: Self-pay | Admitting: Gastroenterology

## 2023-03-27 DIAGNOSIS — Z0000 Encounter for general adult medical examination without abnormal findings: Secondary | ICD-10-CM | POA: Diagnosis not present

## 2023-03-27 DIAGNOSIS — E1140 Type 2 diabetes mellitus with diabetic neuropathy, unspecified: Secondary | ICD-10-CM | POA: Diagnosis not present

## 2023-03-27 DIAGNOSIS — K7460 Unspecified cirrhosis of liver: Secondary | ICD-10-CM | POA: Diagnosis not present

## 2023-03-27 DIAGNOSIS — I700 Atherosclerosis of aorta: Secondary | ICD-10-CM | POA: Diagnosis not present

## 2023-03-27 DIAGNOSIS — Z1331 Encounter for screening for depression: Secondary | ICD-10-CM | POA: Diagnosis not present

## 2023-03-27 DIAGNOSIS — I272 Pulmonary hypertension, unspecified: Secondary | ICD-10-CM | POA: Diagnosis not present

## 2023-03-27 DIAGNOSIS — E782 Mixed hyperlipidemia: Secondary | ICD-10-CM | POA: Diagnosis not present

## 2023-03-27 DIAGNOSIS — N1832 Chronic kidney disease, stage 3b: Secondary | ICD-10-CM | POA: Diagnosis not present

## 2023-03-27 DIAGNOSIS — I442 Atrioventricular block, complete: Secondary | ICD-10-CM | POA: Diagnosis not present

## 2023-03-27 DIAGNOSIS — I129 Hypertensive chronic kidney disease with stage 1 through stage 4 chronic kidney disease, or unspecified chronic kidney disease: Secondary | ICD-10-CM | POA: Diagnosis not present

## 2023-03-27 DIAGNOSIS — D508 Other iron deficiency anemias: Secondary | ICD-10-CM | POA: Diagnosis not present

## 2023-04-08 ENCOUNTER — Encounter: Payer: Self-pay | Admitting: Family Medicine

## 2023-04-08 ENCOUNTER — Emergency Department: Payer: Medicare HMO

## 2023-04-08 ENCOUNTER — Inpatient Hospital Stay
Admission: EM | Admit: 2023-04-08 | Discharge: 2023-04-11 | DRG: 542 | Disposition: A | Discharge status: Home Health | Payer: Medicare HMO | Attending: Internal Medicine | Admitting: Internal Medicine

## 2023-04-08 ENCOUNTER — Other Ambulatory Visit: Payer: Self-pay

## 2023-04-08 DIAGNOSIS — Z515 Encounter for palliative care: Secondary | ICD-10-CM | POA: Diagnosis not present

## 2023-04-08 DIAGNOSIS — M810 Age-related osteoporosis without current pathological fracture: Secondary | ICD-10-CM | POA: Diagnosis not present

## 2023-04-08 DIAGNOSIS — E1122 Type 2 diabetes mellitus with diabetic chronic kidney disease: Secondary | ICD-10-CM | POA: Diagnosis not present

## 2023-04-08 DIAGNOSIS — I129 Hypertensive chronic kidney disease with stage 1 through stage 4 chronic kidney disease, or unspecified chronic kidney disease: Secondary | ICD-10-CM | POA: Diagnosis present

## 2023-04-08 DIAGNOSIS — I10 Essential (primary) hypertension: Secondary | ICD-10-CM | POA: Diagnosis not present

## 2023-04-08 DIAGNOSIS — N183 Chronic kidney disease, stage 3 unspecified: Secondary | ICD-10-CM

## 2023-04-08 DIAGNOSIS — Z9049 Acquired absence of other specified parts of digestive tract: Secondary | ICD-10-CM | POA: Diagnosis not present

## 2023-04-08 DIAGNOSIS — N1832 Chronic kidney disease, stage 3b: Secondary | ICD-10-CM | POA: Diagnosis present

## 2023-04-08 DIAGNOSIS — Z950 Presence of cardiac pacemaker: Secondary | ICD-10-CM | POA: Diagnosis not present

## 2023-04-08 DIAGNOSIS — N6323 Unspecified lump in the left breast, lower outer quadrant: Secondary | ICD-10-CM | POA: Diagnosis not present

## 2023-04-08 DIAGNOSIS — Z7983 Long term (current) use of bisphosphonates: Secondary | ICD-10-CM

## 2023-04-08 DIAGNOSIS — R001 Bradycardia, unspecified: Secondary | ICD-10-CM | POA: Diagnosis present

## 2023-04-08 DIAGNOSIS — Z9011 Acquired absence of right breast and nipple: Secondary | ICD-10-CM | POA: Diagnosis not present

## 2023-04-08 DIAGNOSIS — M069 Rheumatoid arthritis, unspecified: Secondary | ICD-10-CM | POA: Diagnosis present

## 2023-04-08 DIAGNOSIS — I2510 Atherosclerotic heart disease of native coronary artery without angina pectoris: Secondary | ICD-10-CM | POA: Diagnosis not present

## 2023-04-08 DIAGNOSIS — E785 Hyperlipidemia, unspecified: Secondary | ICD-10-CM | POA: Diagnosis present

## 2023-04-08 DIAGNOSIS — I272 Pulmonary hypertension, unspecified: Secondary | ICD-10-CM | POA: Diagnosis present

## 2023-04-08 DIAGNOSIS — M5440 Lumbago with sciatica, unspecified side: Secondary | ICD-10-CM | POA: Diagnosis present

## 2023-04-08 DIAGNOSIS — J84112 Idiopathic pulmonary fibrosis: Secondary | ICD-10-CM | POA: Diagnosis present

## 2023-04-08 DIAGNOSIS — S22000A Wedge compression fracture of unspecified thoracic vertebra, initial encounter for closed fracture: Secondary | ICD-10-CM

## 2023-04-08 DIAGNOSIS — J8410 Pulmonary fibrosis, unspecified: Secondary | ICD-10-CM | POA: Diagnosis not present

## 2023-04-08 DIAGNOSIS — Z794 Long term (current) use of insulin: Secondary | ICD-10-CM

## 2023-04-08 DIAGNOSIS — C649 Malignant neoplasm of unspecified kidney, except renal pelvis: Secondary | ICD-10-CM

## 2023-04-08 DIAGNOSIS — Z7984 Long term (current) use of oral hypoglycemic drugs: Secondary | ICD-10-CM | POA: Diagnosis not present

## 2023-04-08 DIAGNOSIS — E1169 Type 2 diabetes mellitus with other specified complication: Secondary | ICD-10-CM | POA: Diagnosis not present

## 2023-04-08 DIAGNOSIS — R9389 Abnormal findings on diagnostic imaging of other specified body structures: Secondary | ICD-10-CM | POA: Diagnosis present

## 2023-04-08 DIAGNOSIS — Z7189 Other specified counseling: Secondary | ICD-10-CM | POA: Diagnosis not present

## 2023-04-08 DIAGNOSIS — M8008XA Age-related osteoporosis with current pathological fracture, vertebra(e), initial encounter for fracture: Principal | ICD-10-CM | POA: Diagnosis present

## 2023-04-08 DIAGNOSIS — Z85528 Personal history of other malignant neoplasm of kidney: Secondary | ICD-10-CM | POA: Diagnosis not present

## 2023-04-08 DIAGNOSIS — C50512 Malignant neoplasm of lower-outer quadrant of left female breast: Secondary | ICD-10-CM | POA: Diagnosis not present

## 2023-04-08 DIAGNOSIS — C50919 Malignant neoplasm of unspecified site of unspecified female breast: Secondary | ICD-10-CM | POA: Insufficient documentation

## 2023-04-08 DIAGNOSIS — Z87891 Personal history of nicotine dependence: Secondary | ICD-10-CM | POA: Diagnosis not present

## 2023-04-08 DIAGNOSIS — M546 Pain in thoracic spine: Principal | ICD-10-CM

## 2023-04-08 DIAGNOSIS — J9601 Acute respiratory failure with hypoxia: Secondary | ICD-10-CM | POA: Diagnosis present

## 2023-04-08 DIAGNOSIS — Z8051 Family history of malignant neoplasm of kidney: Secondary | ICD-10-CM

## 2023-04-08 DIAGNOSIS — N2889 Other specified disorders of kidney and ureter: Secondary | ICD-10-CM | POA: Diagnosis not present

## 2023-04-08 DIAGNOSIS — M549 Dorsalgia, unspecified: Secondary | ICD-10-CM | POA: Diagnosis present

## 2023-04-08 DIAGNOSIS — C50912 Malignant neoplasm of unspecified site of left female breast: Secondary | ICD-10-CM | POA: Diagnosis not present

## 2023-04-08 DIAGNOSIS — Z79899 Other long term (current) drug therapy: Secondary | ICD-10-CM

## 2023-04-08 DIAGNOSIS — I959 Hypotension, unspecified: Secondary | ICD-10-CM | POA: Diagnosis not present

## 2023-04-08 DIAGNOSIS — S22070A Wedge compression fracture of T9-T10 vertebra, initial encounter for closed fracture: Secondary | ICD-10-CM

## 2023-04-08 DIAGNOSIS — K7460 Unspecified cirrhosis of liver: Secondary | ICD-10-CM | POA: Diagnosis not present

## 2023-04-08 DIAGNOSIS — R222 Localized swelling, mass and lump, trunk: Secondary | ICD-10-CM | POA: Diagnosis not present

## 2023-04-08 LAB — CBC WITH DIFFERENTIAL/PLATELET
Abs Immature Granulocytes: 0.02 10*3/uL (ref 0.00–0.07)
Basophils Absolute: 0 10*3/uL (ref 0.0–0.1)
Basophils Relative: 1 %
Eosinophils Absolute: 0.2 10*3/uL (ref 0.0–0.5)
Eosinophils Relative: 4 %
HCT: 33.8 % — ABNORMAL LOW (ref 36.0–46.0)
Hemoglobin: 11 g/dL — ABNORMAL LOW (ref 12.0–15.0)
Immature Granulocytes: 0 %
Lymphocytes Relative: 12 %
Lymphs Abs: 0.6 10*3/uL — ABNORMAL LOW (ref 0.7–4.0)
MCH: 33 pg (ref 26.0–34.0)
MCHC: 32.5 g/dL (ref 30.0–36.0)
MCV: 101.5 fL — ABNORMAL HIGH (ref 80.0–100.0)
Monocytes Absolute: 0.5 10*3/uL (ref 0.1–1.0)
Monocytes Relative: 10 %
Neutro Abs: 4.1 10*3/uL (ref 1.7–7.7)
Neutrophils Relative %: 73 %
Platelets: 115 10*3/uL — ABNORMAL LOW (ref 150–400)
RBC: 3.33 MIL/uL — ABNORMAL LOW (ref 3.87–5.11)
RDW: 14.6 % (ref 11.5–15.5)
WBC: 5.5 10*3/uL (ref 4.0–10.5)
nRBC: 0 % (ref 0.0–0.2)

## 2023-04-08 LAB — BASIC METABOLIC PANEL
Anion gap: 7 (ref 5–15)
BUN: 25 mg/dL — ABNORMAL HIGH (ref 8–23)
CO2: 25 mmol/L (ref 22–32)
Calcium: 9 mg/dL (ref 8.9–10.3)
Chloride: 109 mmol/L (ref 98–111)
Creatinine, Ser: 1.74 mg/dL — ABNORMAL HIGH (ref 0.44–1.00)
GFR, Estimated: 29 mL/min — ABNORMAL LOW (ref >60)
Glucose, Bld: 178 mg/dL — ABNORMAL HIGH (ref 70–99)
Potassium: 4 mmol/L (ref 3.5–5.1)
Sodium: 141 mmol/L (ref 135–145)

## 2023-04-08 LAB — GLUCOSE, CAPILLARY
Glucose-Capillary: 120 mg/dL — ABNORMAL HIGH (ref 70–99)
Glucose-Capillary: 80 mg/dL (ref 70–99)

## 2023-04-08 MED ORDER — SODIUM CHLORIDE 0.9 % IV BOLUS
500.0000 mL | Freq: Once | INTRAVENOUS | Status: AC
  Administered 2023-04-08: 500 mL via INTRAVENOUS

## 2023-04-08 MED ORDER — BUDESONIDE 0.5 MG/2ML IN SUSP
0.5000 mg | Freq: Two times a day (BID) | RESPIRATORY_TRACT | Status: DC
  Administered 2023-04-09 – 2023-04-11 (×5): 0.5 mg via RESPIRATORY_TRACT
  Filled 2023-04-08 (×5): qty 2

## 2023-04-08 MED ORDER — MORPHINE SULFATE (PF) 4 MG/ML IV SOLN
4.0000 mg | INTRAVENOUS | Status: DC | PRN
  Administered 2023-04-08: 4 mg via INTRAVENOUS
  Filled 2023-04-08: qty 1

## 2023-04-08 MED ORDER — ONDANSETRON HCL 4 MG/2ML IJ SOLN: 4.0000 mg | Freq: Four times a day (QID) | INTRAMUSCULAR | Status: DC | PRN

## 2023-04-08 MED ORDER — MORPHINE SULFATE (PF) 4 MG/ML IV SOLN
4.0000 mg | Freq: Once | INTRAVENOUS | Status: AC
  Administered 2023-04-08: 4 mg via INTRAVENOUS
  Filled 2023-04-08: qty 1

## 2023-04-08 MED ORDER — ONDANSETRON HCL 4 MG PO TABS: 4.0000 mg | ORAL_TABLET | Freq: Four times a day (QID) | ORAL | Status: DC | PRN

## 2023-04-08 MED ORDER — SODIUM CHLORIDE 0.9 % IV SOLN: INTRAVENOUS | Status: DC

## 2023-04-08 MED ORDER — HYDROCODONE-ACETAMINOPHEN 5-325 MG PO TABS
1.0000 | ORAL_TABLET | ORAL | Status: DC | PRN
  Administered 2023-04-09 – 2023-04-11 (×5): 1 via ORAL
  Filled 2023-04-08 (×6): qty 1

## 2023-04-08 MED ORDER — ENOXAPARIN SODIUM 30 MG/0.3ML IJ SOSY
30.0000 mg | PREFILLED_SYRINGE | INTRAMUSCULAR | Status: DC
  Administered 2023-04-08 – 2023-04-10 (×3): 30 mg via SUBCUTANEOUS
  Filled 2023-04-08 (×3): qty 0.3

## 2023-04-08 MED ORDER — INSULIN ASPART 100 UNIT/ML IJ SOLN
0.0000 [IU] | Freq: Three times a day (TID) | INTRAMUSCULAR | Status: DC
  Administered 2023-04-09: 1 [IU] via SUBCUTANEOUS
  Administered 2023-04-09 (×2): 2 [IU] via SUBCUTANEOUS
  Administered 2023-04-10 – 2023-04-11 (×2): 1 [IU] via SUBCUTANEOUS
  Filled 2023-04-08 (×5): qty 1

## 2023-04-08 MED ORDER — INSULIN ASPART 100 UNIT/ML IJ SOLN
3.0000 [IU] | Freq: Three times a day (TID) | INTRAMUSCULAR | Status: DC
  Administered 2023-04-08 – 2023-04-11 (×8): 3 [IU] via SUBCUTANEOUS
  Filled 2023-04-08 (×8): qty 1

## 2023-04-08 MED ORDER — ONDANSETRON HCL 4 MG/2ML IJ SOLN
4.0000 mg | INTRAMUSCULAR | Status: AC
  Administered 2023-04-08: 4 mg via INTRAVENOUS
  Filled 2023-04-08: qty 2

## 2023-04-08 MED ORDER — HYDROCODONE-ACETAMINOPHEN 5-325 MG PO TABS
1.0000 | ORAL_TABLET | Freq: Once | ORAL | Status: AC
  Administered 2023-04-08: 1 via ORAL
  Filled 2023-04-08: qty 1

## 2023-04-08 MED ORDER — INSULIN DETEMIR 100 UNIT/ML ~~LOC~~ SOLN
15.0000 [IU] | Freq: Every day | SUBCUTANEOUS | Status: DC
  Administered 2023-04-09: 15 [IU] via SUBCUTANEOUS
  Filled 2023-04-08 (×4): qty 0.15

## 2023-04-08 MED ORDER — IPRATROPIUM-ALBUTEROL 0.5-2.5 (3) MG/3ML IN SOLN: 3.0000 mL | RESPIRATORY_TRACT | Status: DC | PRN

## 2023-04-08 NOTE — Progress Notes (Signed)
PHARMACIST - PHYSICIAN COMMUNICATION  CONCERNING:  Enoxaparin (Lovenox) for DVT Prophylaxis    RECOMMENDATION: Patient was prescribed enoxaprin 40mg  q24 hours for VTE prophylaxis.   Filed Weights   04/08/23 0544  Weight: 72.6 kg (160 lb)    Body mass index is 26.63 kg/m.  Estimated Creatinine Clearance: 24.9 mL/min (A) (by C-G formula based on SCr of 1.74 mg/dL (H)).  Patient is candidate for enoxaparin 30mg  every 24 hours based on CrCl <28ml/min.  DESCRIPTION: Pharmacy has adjusted enoxaparin dose per [facility] policy.  Patient is now receiving enoxaparin 30 mg every 24 hours    Rockwell Alexandria, PharmD Clinical Pharmacist  04/08/2023 3:34 PM

## 2023-04-08 NOTE — ED Provider Notes (Signed)
Patient's labs reviewed, and her GFR is less than 30.  Contraindicated to provide iodinated contrast.  She also cannot undergo MRI due to pacemaker  Discussed further with her and her son, advises the pain has been present for about 9 days radiates from the lower back.  She reports that it is located identifies areas of her low thoracic to upper lumbar region and shoots out bilaterally.  She denies any chest pain there is no shortness of breath.  No hypoxia.  In consideration, and also in review of her GFR, loss of her pacemaker, discussed with patient and her son understanding that we will proceed with the studies without contrast.  Discussed limitations and inability to identify problems with vasculature arteries pains, but after discussing with the patient examining her and reviewing her clinical history as well as risks and benefits of iodinated contrast we will proceed without contrast studies.  Doubt thromboembolism as causation especially given her lack of chest symptoms   Sharyn Creamer, MD 04/08/23 [phone]

## 2023-04-08 NOTE — Assessment & Plan Note (Signed)
Acute onset of intractable mid and low back pain x 2 days CT imaging with noted with noted new wedge deformities at T3 and T10 consistent with pain distribution Noted prior T12 and L4 kyphoplastys  Neurosurgery preliminary consulted with plan for TLSO brace, PT OT evaluation Pain control  Follow closely

## 2023-04-08 NOTE — H&P (Signed)
History and Physical    Patient: Emily Faulkner ZOX:[id number] DOB: March 18, 1941 DOA: 04/08/2023 DOS: the patient was seen and examined on 04/08/2023 PCP: Marina Goodell, MD  Patient coming from: Home  Chief Complaint:  Chief Complaint  Patient presents with   Back Pain   HPI: Emily Faulkner is a 82 y.o. female with medical history significant of multiple medical use including CKD, complete heart block status post permanent pacemaker, osteoporosis, hypertension, hyperlipidemia, renal cancer status post ablation, presenting with intractable back pain.  Per the family, patient was ambulating and had sudden onset of mid and low back pain.  Noted baseline history of vertebral compression fracture status post kyphoplasty.  No reported falls or trauma.  Has had difficulty with ambulation since this point.  No fevers or chills.  No nausea or vomiting.  No reported chest pain or shortness of breath. Presented to the ER afebrile, hemodynamically stable.  Satting well on room air.  White count 5.5, hemoglobin 11, platelets 115, creatinine 1.7, glucose 178.  CT of the T and L-spine with noted new left breast 3.8 x 3 point centimeter mass concerning for breast malignancy, T3 superior endplate fracture, new T10 superior endplate fracture.  Thickened endometrium on pelvis.  Positive pulmonary fibrosis.  Review of Systems: As mentioned in the history of present illness. All other systems reviewed and are negative. Past Medical History:  Diagnosis Date   Acute kidney failure (HCC) 11/16/2020   AKI (acute kidney injury) (HCC) 03/17/2018   Anemia    Cancer (HCC)    breast cancer right mastectomy   Diabetes mellitus without complication (HCC)    GIB (gastrointestinal bleeding) 02/20/2018   Glaucoma    Hyperkalemia 10/24/2020   Hyperlipidemia    Hypertension    Incontinence of bowel    Neuropathy    Odynophagia 10/24/2020   Osteoarthritis    Presence of permanent cardiac pacemaker    Rheumatoid  arthritis (HCC)    Syncope and collapse 10/24/2020   Telangiectasias    Past Surgical History:  Procedure Laterality Date   BREAST SURGERY     right mastectomy   CHOLECYSTECTOMY     COLONOSCOPY     COLONOSCOPY WITH PROPOFOL N/A 08/14/2016   Procedure: COLONOSCOPY WITH PROPOFOL;  Surgeon: Christena Deem, MD;  Location: [facility] ENDOSCOPY;  Service: Endoscopy;  Laterality: N/A;   COLONOSCOPY WITH PROPOFOL N/A 02/22/2018   Procedure: COLONOSCOPY WITH PROPOFOL;  Surgeon: Wyline Mood, MD;  Location: [facility] ENDOSCOPY;  Service: Gastroenterology;  Laterality: N/A;   ENTEROSCOPY N/A 04/12/2021   Procedure: ENTEROSCOPY;  Surgeon: Toney Reil, MD;  Location: [facility] ENDOSCOPY;  Service: Gastroenterology;  Laterality: N/A;  push   ESOPHAGOGASTRODUODENOSCOPY (EGD) WITH PROPOFOL N/A 08/14/2016   Procedure: ESOPHAGOGASTRODUODENOSCOPY (EGD) WITH PROPOFOL;  Surgeon: Christena Deem, MD;  Location: [facility] ENDOSCOPY;  Service: Endoscopy;  Laterality: N/A;   ESOPHAGOGASTRODUODENOSCOPY (EGD) WITH PROPOFOL N/A 01/30/2018   Procedure: ESOPHAGOGASTRODUODENOSCOPY (EGD) WITH PROPOFOL;  Surgeon: Toledo, Boykin Nearing, MD;  Location: [facility] ENDOSCOPY;  Service: Gastroenterology;  Laterality: N/A;   ESOPHAGOGASTRODUODENOSCOPY (EGD) WITH PROPOFOL N/A 02/22/2018   Procedure: ESOPHAGOGASTRODUODENOSCOPY (EGD) WITH PROPOFOL;  Surgeon: Wyline Mood, MD;  Location: [facility] ENDOSCOPY;  Service: Gastroenterology;  Laterality: N/A;   EXCISIONAL HEMORRHOIDECTOMY     IR RADIOLOGIST EVAL & MGMT  08/07/2022   IR RADIOLOGIST EVAL & MGMT  09/12/2022   KYPHOPLASTY N/A 03/20/2018   Procedure: [id number];  Surgeon: Kennedy Bucker, MD;  Location: [facility] ORS;  Service: Orthopedics;  Laterality: N/A;   PACEMAKER  IMPLANT N/A 10/26/2020   Procedure: PACEMAKER IMPLANT;  Surgeon: Marcina Millard, MD;  Location: [facility] INVASIVE CV LAB;  Service: Cardiovascular;  Laterality: N/A;   RADIOLOGY WITH ANESTHESIA N/A 08/31/2022   Procedure: CT renal microwave  ablation;  Surgeon: Pernell Dupre, MD;  Location: [facility] OR;  Service: Radiology;  Laterality: N/A;   Social History:  reports that she quit smoking about 11 years ago. Her smoking use included cigarettes. She has never used smokeless tobacco. She reports that she does not drink alcohol and does not use drugs.  No Known Allergies  Family History  Problem Relation Age of Onset   Kidney cancer Son    Bladder Cancer Neg Hx     Prior to Admission medications   Medication Sig Start Date End Date Taking? Authorizing Provider  SANDOSTATIN LAR DEPOT 20 MG injection Inject 20 mg into the muscle every 28 (twenty-eight) days. 01/22/23  Yes [provider]  ACCU-CHEK AVIVA PLUS test strip  03/28/18   [provider]  ACCU-CHEK SOFTCLIX LANCETS lancets once daily Use as instructed. 04/14/18   [provider]  acetaminophen (TYLENOL) 500 MG tablet Take 500 mg by mouth every 8 (eight) hours as needed for moderate pain or mild pain.    [provider]  alendronate (FOSAMAX) 70 MG tablet TAKE 1 TABLET BY MOUTH EVERY 7DAYS WITH A FULL GLASS OF WATER. DO NOT LIE DOWN FOR 30 MINUTES 03/21/21   [provider]  Biotin 1000 MCG tablet Take 1,000 mcg by mouth daily.    [provider]  Calcium Carb-Cholecalciferol (OYSTER SHELL CALCIUM W/D) 500-5 MG-MCG TABS Take 1 tablet by mouth daily.    [provider]  Cholecalciferol 50 MCG (2000 UT) CAPS Take 2,000 Units by mouth daily.    [provider]  feeding supplement, ENSURE ENLIVE, (ENSURE ENLIVE) LIQD Take 237 mLs by mouth 2 (two) times daily between meals. 02/26/18   Enedina Finner, MD  insulin glargine (LANTUS) 100 UNIT/ML Solostar Pen Inject 26 Units into the skin daily. (may take up to 50u based upon blood glucose reading)    [provider]  Insulin Pen Needle 32G X 4 MM MISC USE 1 NEEDLE SUBCUTANEOUSLY ONCE A DAY AS DIRECTED 03/24/18   [provider]  losartan (COZAAR) 50 MG  tablet Take 25 mg by mouth daily. 07/28/22   [provider]  Magnesium 100 MG CAPS Take 2 capsules by mouth daily. Glycinate    [provider]  metFORMIN (GLUCOPHAGE-XR) 500 MG 24 hr tablet Take 500 mg by mouth daily with breakfast. 09/18/19   [provider]  niacin (VITAMIN B3) 500 MG ER tablet Take 500 mg by mouth at bedtime.    [provider]  niacin 500 MG CR capsule Take 500 mg by mouth at bedtime.    [provider]  octreotide (SANDOSTATIN LAR) 10 MG injection Inject 10 mg into the muscle every 28 (twenty-eight) days. Inject 10 mg into the muscle every 28 (twenty-eight) days    [provider]  omeprazole (PRILOSEC) 40 MG capsule TAKE 1 CAPSULE BY MOUTH EVERY DAY BEFORE BREAKFAST 02/27/23   Vanga, Loel Dubonnet, MD  Probiotic Product (PROBIOTIC PO) Take 1 tablet by mouth daily. 2 billion    [provider]  simvastatin (ZOCOR) 20 MG tablet Take 20 mg by mouth at bedtime. 12/23/17   [provider]  sitaGLIPtin (JANUVIA) 50 MG tablet Take 50 mg by mouth daily.    [provider]  TRAVATAN Z 0.004 % SOLN ophthalmic solution Place 1 drop into both eyes at bedtime. 12/05/17   [provider]  TURMERIC PO Take 538 mg by mouth daily.    [provider]  vitamin B-12 (CYANOCOBALAMIN) 1000 MCG tablet Take 2,000 mcg by mouth daily.    [provider]    Physical Exam: Vitals:   04/08/23 1329 04/08/23 1330 04/08/23 1430 04/08/23 1500  BP: (!) 125/48 (!) 125/48 (!) 105/31 (!) 111/37  Pulse: 88 82 70 77  Resp: (!) 21 20 20 16   Temp:    97.8 F (36.6 C)  TempSrc:    Oral  SpO2: 100% 98% 100% 95%  Weight:      Height:       Physical Exam Constitutional:      Comments: Underweight    HENT:     Head: Normocephalic and atraumatic.     Nose: Nose normal.     Mouth/Throat:     Mouth: Mucous membranes are moist.  Eyes:     Pupils: Pupils are equal, round, and reactive to light.   Cardiovascular:     Rate and Rhythm: Normal rate and regular rhythm.  Pulmonary:     Effort: Pulmonary effort is normal.     Breath sounds: Wheezing present.  Abdominal:     General: Bowel sounds are normal.  Musculoskeletal:        General: Normal range of motion.  Skin:    General: Skin is warm.  Neurological:     General: No focal deficit present.  Psychiatric:        Mood and Affect: Mood normal.     Data Reviewed:  There are no new results to review at this time. CT T-SPINE NO CHARGE CLINICAL DATA:  Abdominal and flank pain, stone suspected, radiating back pain, history of cryoablation for left renal cell carcinoma * Tracking Code: BO *  EXAM: CT CHEST, ABDOMEN AND PELVIS WITHOUT CONTRAST  CT THORACIC AND LUMBAR SPINE WITHOUT CONTRAST  TECHNIQUE: Multidetector CT imaging of the chest, abdomen and pelvis was performed following the standard protocol without IV contrast.  Multidetector CT imaging of the thoracic and lumbar spine was performed following the standard protocol without IV contrast.  RADIATION DOSE REDUCTION: This exam was performed according to the departmental dose-optimization program which includes automated exposure control, adjustment of the mA and/or kV according to patient size and/or use of iterative reconstruction technique.  COMPARISON:  None Available.  FINDINGS: CT CHEST FINDINGS  Cardiovascular: Left chest multi lead pacer. Aortic atherosclerosis. Three-vessel coronary artery calcifications. Normal heart size. No pericardial effusion.  Mediastinum/Nodes: No enlarged mediastinal, hilar, or axillary lymph nodes. Thyroid gland, trachea, and esophagus demonstrate no significant findings.  Lungs/Pleura: Mild pulmonary fibrosis in a pattern with apical to basal gradient, featuring irregular peripheral interstitial opacity, septal thickening, and ground-glass, with minimal traction bronchiectasis of the lung bases and minimal  subpleural bronchiolectasis, without clear evidence of honeycombing. No pleural effusion or pneumothorax.  Musculoskeletal: Spiculated soft tissue mass of the lower outer left breast, which appears to involve the underlying pectoral musculature, measuring 3.8 x 3.3 cm (series 2, image 33). Status post right mastectomy and axillary lymph node dissection. No suspicious osseous lesions identified.  CT ABDOMEN PELVIS FINDINGS  Hepatobiliary: Coarse, nodular cirrhotic morphology of the liver. No noncontrast evidence of focal liver lesion. Status post cholecystectomy. No biliary ductal dilatation.  Pancreas: Unremarkable. No pancreatic ductal dilatation or surrounding inflammatory changes.  Spleen: Normal in size without significant abnormality.  Adrenals/Urinary Tract: Adrenal glands are unremarkable. Redemonstrated lesion of the anterior midportion of the left kidney measuring 3.3 x 2.5 cm, previously 3.8 x 3.3 cm, with increased internal hypodensity and adjacent fat necrosis (series 2, image 68). The right kidney is normal in noncontrast appearance without renal calculi, obvious solid lesion, or hydronephrosis. Bladder is unremarkable.  Stomach/Bowel: Stomach is within normal limits. Appendix not clearly visualized. No evidence of bowel wall thickening, distention, or inflammatory changes. Sigmoid diverticulosis.  Vascular/Lymphatic: Severe aortic atherosclerosis. Recanalization of the umbilical vein as well as varices about the left upper quadrant. No enlarged abdominal or pelvic lymph nodes.  Reproductive: Endometrial thickening or fluid within the endometrial cavity measuring up to 1.1 cm, abnormal in the late postmenopausal setting (series 6, image 72).  Other: No abdominal wall hernia or abnormality. No ascites.  Musculoskeletal: No acute osseous findings.  CT THORACIC AND LUMBAR SPINE FINDINGS  Alignment: Straightening of the normal thoracic kyphosis and  normal lumbar lordosis. Mild, focal kyphosis at T12 secondary to wedge deformity.  Vertebral bodies: Age-indeterminate, sclerotic appearing superior endplate wedge deformity T3 (series 6, image 70). New, acute appearing non depressed superior endplate fracture of T10 (series 6, image 69). Unchanged wedge deformity of T12 status post vertebral cement augmentation. Unchanged superior endplate wedge deformity of L4, status post interval vertebral cement augmentation (series 6, image 73).  Disc spaces: Focally mild disc degenerative disease throughout the thoracic spine. Focally severe disc degenerative disease of T11 through L1 and L2-L3 with otherwise mild disc degenerative disease of the lumbar spine. Mild-to-moderate facet degenerative change throughout the lower lumbar spine.  Paraspinous soft tissues: Unremarkable.  IMPRESSION: 1. Spiculated soft tissue mass of the lower outer left breast, which appears to involve the underlying pectoral musculature, measuring 3.8 x 3.3 cm, highly concerning for primary breast malignancy. 2. Redemonstrated lesion of the anterior midportion of the left kidney, slightly diminished in size with increased internal hypodensity and adjacent fat necrosis, generally in keeping with evolving post ablation change. Assessment for residual or recurrent viable tissue is very limited on noncontrast examination. 3. Age-indeterminate, sclerotic appearing superior endplate wedge deformity of T3, not previously imaged. 4. New, acute appearing non depressed superior endplate fracture of T10. 5. Unchanged wedge deformity of T12 status post vertebral cement augmentation. 6. Unchanged superior endplate wedge deformity of L4, status post interval vertebral cement augmentation. 7. Mild pulmonary fibrosis in a pattern with apical to basal gradient, featuring irregular peripheral interstitial opacity, septal thickening, and ground-glass, with minimal  traction bronchiectasis of the lung bases and minimal subpleural bronchiolectasis, without clear evidence of honeycombing. 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 8. Cirrhosis. No noncontrast evidence of focal liver lesion. Recanalization of the umbilical vein. 9. Endometrial thickening or fluid within the endometrial cavity measuring up to 1.1 cm, abnormal in the late postmenopausal setting. Recommend pelvic ultrasound on a nonemergent, outpatient basis for further evaluation when clinically appropriate. 10. Coronary artery disease.  Aortic Atherosclerosis (ICD10-I70.0).  Electronically Signed   By: Jearld Lesch M.D.   On: 04/08/2023 11:40 CT L-SPINE NO CHARGE CLINICAL DATA:  Abdominal and flank pain, stone suspected, radiating back pain, history of cryoablation for left renal cell carcinoma * Tracking Code: BO *  EXAM: CT CHEST, ABDOMEN AND PELVIS WITHOUT CONTRAST  CT THORACIC AND LUMBAR SPINE WITHOUT CONTRAST  TECHNIQUE: Multidetector CT imaging of the chest, abdomen and pelvis was performed following the standard protocol without IV contrast.  Multidetector CT imaging of the thoracic and lumbar spine was  performed following the standard protocol without IV contrast.  RADIATION DOSE REDUCTION: This exam was performed according to the departmental dose-optimization program which includes automated exposure control, adjustment of the mA and/or kV according to patient size and/or use of iterative reconstruction technique.  COMPARISON:  None Available.  FINDINGS: CT CHEST FINDINGS  Cardiovascular: Left chest multi lead pacer. Aortic atherosclerosis. Three-vessel coronary artery calcifications. Normal heart size. No pericardial effusion.  Mediastinum/Nodes: No enlarged mediastinal, hilar, or axillary lymph nodes. Thyroid gland, trachea, and esophagus demonstrate no significant findings.  Lungs/Pleura: Mild pulmonary fibrosis in a  pattern with apical to basal gradient, featuring irregular peripheral interstitial opacity, septal thickening, and ground-glass, with minimal traction bronchiectasis of the lung bases and minimal subpleural bronchiolectasis, without clear evidence of honeycombing. No pleural effusion or pneumothorax.  Musculoskeletal: Spiculated soft tissue mass of the lower outer left breast, which appears to involve the underlying pectoral musculature, measuring 3.8 x 3.3 cm (series 2, image 33). Status post right mastectomy and axillary lymph node dissection. No suspicious osseous lesions identified.  CT ABDOMEN PELVIS FINDINGS  Hepatobiliary: Coarse, nodular cirrhotic morphology of the liver. No noncontrast evidence of focal liver lesion. Status post cholecystectomy. No biliary ductal dilatation.  Pancreas: Unremarkable. No pancreatic ductal dilatation or surrounding inflammatory changes.  Spleen: Normal in size without significant abnormality.  Adrenals/Urinary Tract: Adrenal glands are unremarkable. Redemonstrated lesion of the anterior midportion of the left kidney measuring 3.3 x 2.5 cm, previously 3.8 x 3.3 cm, with increased internal hypodensity and adjacent fat necrosis (series 2, image 68). The right kidney is normal in noncontrast appearance without renal calculi, obvious solid lesion, or hydronephrosis. Bladder is unremarkable.  Stomach/Bowel: Stomach is within normal limits. Appendix not clearly visualized. No evidence of bowel wall thickening, distention, or inflammatory changes. Sigmoid diverticulosis.  Vascular/Lymphatic: Severe aortic atherosclerosis. Recanalization of the umbilical vein as well as varices about the left upper quadrant. No enlarged abdominal or pelvic lymph nodes.  Reproductive: Endometrial thickening or fluid within the endometrial cavity measuring up to 1.1 cm, abnormal in the late postmenopausal setting (series 6, image 72).  Other: No abdominal wall  hernia or abnormality. No ascites.  Musculoskeletal: No acute osseous findings.  CT THORACIC AND LUMBAR SPINE FINDINGS  Alignment: Straightening of the normal thoracic kyphosis and normal lumbar lordosis. Mild, focal kyphosis at T12 secondary to wedge deformity.  Vertebral bodies: Age-indeterminate, sclerotic appearing superior endplate wedge deformity T3 (series 6, image 70). New, acute appearing non depressed superior endplate fracture of T10 (series 6, image 69). Unchanged wedge deformity of T12 status post vertebral cement augmentation. Unchanged superior endplate wedge deformity of L4, status post interval vertebral cement augmentation (series 6, image 73).  Disc spaces: Focally mild disc degenerative disease throughout the thoracic spine. Focally severe disc degenerative disease of T11 through L1 and L2-L3 with otherwise mild disc degenerative disease of the lumbar spine. Mild-to-moderate facet degenerative change throughout the lower lumbar spine.  Paraspinous soft tissues: Unremarkable.  IMPRESSION: 1. Spiculated soft tissue mass of the lower outer left breast, which appears to involve the underlying pectoral musculature, measuring 3.8 x 3.3 cm, highly concerning for primary breast malignancy. 2. Redemonstrated lesion of the anterior midportion of the left kidney, slightly diminished in size with increased internal hypodensity and adjacent fat necrosis, generally in keeping with evolving post ablation change. Assessment for residual or recurrent viable tissue is very limited on noncontrast examination. 3. Age-indeterminate, sclerotic appearing superior endplate wedge deformity of T3, not previously imaged. 4. New, acute appearing non  depressed superior endplate fracture of T10. 5. Unchanged wedge deformity of T12 status post vertebral cement augmentation. 6. Unchanged superior endplate wedge deformity of L4, status post interval vertebral cement augmentation. 7.  Mild pulmonary fibrosis in a pattern with apical to basal gradient, featuring irregular peripheral interstitial opacity, septal thickening, and ground-glass, with minimal traction bronchiectasis of the lung bases and minimal subpleural bronchiolectasis, without clear evidence of honeycombing. 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 8. Cirrhosis. No noncontrast evidence of focal liver lesion. Recanalization of the umbilical vein. 9. Endometrial thickening or fluid within the endometrial cavity measuring up to 1.1 cm, abnormal in the late postmenopausal setting. Recommend pelvic ultrasound on a nonemergent, outpatient basis for further evaluation when clinically appropriate. 10. Coronary artery disease.  Aortic Atherosclerosis (ICD10-I70.0).  Electronically Signed   By: Jearld Lesch M.D.   On: 04/08/2023 11:40 CT CHEST ABDOMEN PELVIS WO CONTRAST CLINICAL DATA:  Abdominal and flank pain, stone suspected, radiating back pain, history of cryoablation for left renal cell carcinoma * Tracking Code: BO *  EXAM: CT CHEST, ABDOMEN AND PELVIS WITHOUT CONTRAST  CT THORACIC AND LUMBAR SPINE WITHOUT CONTRAST  TECHNIQUE: Multidetector CT imaging of the chest, abdomen and pelvis was performed following the standard protocol without IV contrast.  Multidetector CT imaging of the thoracic and lumbar spine was performed following the standard protocol without IV contrast.  RADIATION DOSE REDUCTION: This exam was performed according to the departmental dose-optimization program which includes automated exposure control, adjustment of the mA and/or kV according to patient size and/or use of iterative reconstruction technique.  COMPARISON:  None Available.  FINDINGS: CT CHEST FINDINGS  Cardiovascular: Left chest multi lead pacer. Aortic atherosclerosis. Three-vessel coronary artery calcifications. Normal heart size. No pericardial  effusion.  Mediastinum/Nodes: No enlarged mediastinal, hilar, or axillary lymph nodes. Thyroid gland, trachea, and esophagus demonstrate no significant findings.  Lungs/Pleura: Mild pulmonary fibrosis in a pattern with apical to basal gradient, featuring irregular peripheral interstitial opacity, septal thickening, and ground-glass, with minimal traction bronchiectasis of the lung bases and minimal subpleural bronchiolectasis, without clear evidence of honeycombing. No pleural effusion or pneumothorax.  Musculoskeletal: Spiculated soft tissue mass of the lower outer left breast, which appears to involve the underlying pectoral musculature, measuring 3.8 x 3.3 cm (series 2, image 33). Status post right mastectomy and axillary lymph node dissection. No suspicious osseous lesions identified.  CT ABDOMEN PELVIS FINDINGS  Hepatobiliary: Coarse, nodular cirrhotic morphology of the liver. No noncontrast evidence of focal liver lesion. Status post cholecystectomy. No biliary ductal dilatation.  Pancreas: Unremarkable. No pancreatic ductal dilatation or surrounding inflammatory changes.  Spleen: Normal in size without significant abnormality.  Adrenals/Urinary Tract: Adrenal glands are unremarkable. Redemonstrated lesion of the anterior midportion of the left kidney measuring 3.3 x 2.5 cm, previously 3.8 x 3.3 cm, with increased internal hypodensity and adjacent fat necrosis (series 2, image 68). The right kidney is normal in noncontrast appearance without renal calculi, obvious solid lesion, or hydronephrosis. Bladder is unremarkable.  Stomach/Bowel: Stomach is within normal limits. Appendix not clearly visualized. No evidence of bowel wall thickening, distention, or inflammatory changes. Sigmoid diverticulosis.  Vascular/Lymphatic: Severe aortic atherosclerosis. Recanalization of the umbilical vein as well as varices about the left upper quadrant. No enlarged abdominal or pelvic  lymph nodes.  Reproductive: Endometrial thickening or fluid within the endometrial cavity measuring up to 1.1 cm, abnormal in the late postmenopausal setting (series 6, image 72).  Other: No abdominal wall hernia or abnormality. No ascites.  Musculoskeletal:  No acute osseous findings.  CT THORACIC AND LUMBAR SPINE FINDINGS  Alignment: Straightening of the normal thoracic kyphosis and normal lumbar lordosis. Mild, focal kyphosis at T12 secondary to wedge deformity.  Vertebral bodies: Age-indeterminate, sclerotic appearing superior endplate wedge deformity T3 (series 6, image 70). New, acute appearing non depressed superior endplate fracture of T10 (series 6, image 69). Unchanged wedge deformity of T12 status post vertebral cement augmentation. Unchanged superior endplate wedge deformity of L4, status post interval vertebral cement augmentation (series 6, image 73).  Disc spaces: Focally mild disc degenerative disease throughout the thoracic spine. Focally severe disc degenerative disease of T11 through L1 and L2-L3 with otherwise mild disc degenerative disease of the lumbar spine. Mild-to-moderate facet degenerative change throughout the lower lumbar spine.  Paraspinous soft tissues: Unremarkable.  IMPRESSION: 1. Spiculated soft tissue mass of the lower outer left breast, which appears to involve the underlying pectoral musculature, measuring 3.8 x 3.3 cm, highly concerning for primary breast malignancy. 2. Redemonstrated lesion of the anterior midportion of the left kidney, slightly diminished in size with increased internal hypodensity and adjacent fat necrosis, generally in keeping with evolving post ablation change. Assessment for residual or recurrent viable tissue is very limited on noncontrast examination. 3. Age-indeterminate, sclerotic appearing superior endplate wedge deformity of T3, not previously imaged. 4. New, acute appearing non depressed superior endplate  fracture of T10. 5. Unchanged wedge deformity of T12 status post vertebral cement augmentation. 6. Unchanged superior endplate wedge deformity of L4, status post interval vertebral cement augmentation. 7. Mild pulmonary fibrosis in a pattern with apical to basal gradient, featuring irregular peripheral interstitial opacity, septal thickening, and ground-glass, with minimal traction bronchiectasis of the lung bases and minimal subpleural bronchiolectasis, without clear evidence of honeycombing. 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 8. Cirrhosis. No noncontrast evidence of focal liver lesion. Recanalization of the umbilical vein. 9. Endometrial thickening or fluid within the endometrial cavity measuring up to 1.1 cm, abnormal in the late postmenopausal setting. Recommend pelvic ultrasound on a nonemergent, outpatient basis for further evaluation when clinically appropriate. 10. Coronary artery disease.  Aortic Atherosclerosis (ICD10-I70.0).  Electronically Signed   By: Jearld Lesch M.D.   On: 04/08/2023 11:40  Lab Results  Component Value Date   WBC 5.5 04/08/2023   HGB 11.0 (L) 04/08/2023   HCT 33.8 (L) 04/08/2023   MCV 101.5 (H) 04/08/2023   PLT 115 (L) 04/08/2023   Last metabolic panel Lab Results  Component Value Date   GLUCOSE 178 (H) 04/08/2023   NA 141 04/08/2023   K 4.0 04/08/2023   CL 109 04/08/2023   CO2 25 04/08/2023   BUN 25 (H) 04/08/2023   CREATININE 1.74 (H) 04/08/2023   GFRNONAA 29 (L) 04/08/2023   CALCIUM 9.0 04/08/2023   PHOS 3.8 10/28/2020   PROT 6.9 08/27/2022   ALBUMIN 3.2 (L) 08/27/2022   LABGLOB 3.0 03/01/2021   AGRATIO 1.4 01/28/2019   BILITOT 1.0 08/27/2022   ALKPHOS 72 08/27/2022   AST 33 08/27/2022   ALT 18 08/27/2022   ANIONGAP 7 04/08/2023    Assessment and Plan: * Intractable back pain Acute onset of intractable mid and low back pain x 2 days CT imaging with noted with noted new wedge deformities  at T3 and T10 consistent with pain distribution Noted prior T12 and L4 kyphoplastys  Neurosurgery preliminary consulted with plan for TLSO brace, PT OT evaluation Pain control  Follow closely    Stage 3b chronic kidney disease (HCC) Baseline creatinine  1.2-1.5 with GFR in the 30s to 40s Creatinine 1.76 today with GFR in the upper 20s Mildly dry IV fluid hydration Minimize nephrotoxic agents  Breast cancer (HCC) CT imaging with noted Spiculated soft tissue mass of the lower outer left breast, 3.8 x 3.3 cm, highly concerning for primary breast malignancy Follows Dr. Smith Robert outpt for iron deficiency anemia  Will notify Dr. Smith Robert as an FYI at the request of the family  Follow    Symptomatic bradycardia Followed by Dr. Darrold Junker Status post permanent pacemaker Monitor  Age-related osteoporosis without current pathological fracture Followed by endocrinology in the [facility] system On Fosamax, calcium, vitamin D Noted vertebral fractures with prior kyphoplasty   Renal mass Status post ablative therapy March 2024  Hypertension BP stable  Titrate home regimen    Type 2 diabetes mellitus (HCC) SSI Long acting insulin  A1c      Advance Care Planning:   Code Status: Full Code   Consults: NSG   Family Communication: Family at the bedside   Severity of Illness: The appropriate patient status for this patient is INPATIENT. Inpatient status is judged to be reasonable and necessary in order to provide the required intensity of service to ensure the patient's safety. The patient's presenting symptoms, physical exam findings, and initial radiographic and laboratory data in the context of their chronic comorbidities is felt to place them at high risk for further clinical deterioration. Furthermore, it is not anticipated that the patient will be medically stable for discharge from the hospital within 2 midnights of admission.   * I certify that at the point of admission it is my clinical  judgment that the patient will require inpatient hospital care spanning beyond 2 midnights from the point of admission due to high intensity of service, high risk for further deterioration and high frequency of surveillance required.*  Author: Floydene Flock, MD 04/08/2023 3:56 PM  For on call review [email or web address].

## 2023-04-08 NOTE — Assessment & Plan Note (Signed)
BP stable Titrate home regimen 

## 2023-04-08 NOTE — Assessment & Plan Note (Signed)
Followed by endocrinology in the [facility] system On Fosamax, calcium, vitamin D Noted vertebral fractures with prior kyphoplasty

## 2023-04-08 NOTE — ED Notes (Signed)
Patient transported to CT 

## 2023-04-08 NOTE — Assessment & Plan Note (Addendum)
Status post ablative therapy March 2024

## 2023-04-08 NOTE — Plan of Care (Signed)

## 2023-04-08 NOTE — ED Provider Notes (Signed)
[facility] Provider Note    Event Date/Time   First MD Initiated Contact with Patient 04/08/23 [phone]     (approximate)   History   Back Pain   HPI Emily Faulkner is a 82 y.o. female with extensive chronic medical issues including chronic kidney disease with a prior left renal mass for which she had surgery and sees Dr. Cherylann Ratel, prior history of complete heart block with implanted pacemaker, and history of pulmonary hypertension.  She presents tonight for gradually worsening mid and lower back pain over the last 9 days.  She has had back surgeries in the past, both performed by Dr. Rosita Kea and at [facility], on both her lower thoracic and upper lumbar spinal processes.  She always has a little bit of pain but said that she started having a great deal of pain in the right side of her lower back starting about 9 days ago.  She found that if she laid in certain positions it would feel more comfortable, but now it extends all throughout her back and hurts all the time rather than just when she is in certain positions.  She has had no acute weakness in her legs and is able to ambulate with a cane, but it hurts now to do so and it is very painful for her to get up and go to the bathroom.  She has not had any urinary incontinence nor urinary retention.  No recent fever, chest pain, nor shortness of breath.     Physical Exam   Triage Vital Signs: ED Triage Vitals [04/08/23 0544]  Encounter Vitals Group     BP (!) 141/58     Systolic BP Percentile      Diastolic BP Percentile      Pulse Rate 85     Resp (!) 24     Temp 98.8 F (37.1 C)     Temp Source Oral     SpO2 98 %     Weight 72.6 kg (160 lb)     Height 1.651 m (5\' 5" )     Head Circumference      Peak Flow      Pain Score 4     Pain Loc      Pain Education      Exclude from Growth Chart     Most recent vital signs: Vitals:   04/08/23 0544  BP: (!) 141/58  Pulse: 85  Resp: (!) 24  Temp: 98.8 F (37.1 C)   SpO2: 98%    General: Awake, appears extremely uncomfortable and is in pain. CV:  Good peripheral perfusion.  Pacemaker in place.  Regular rate and rhythm. Resp:  Normal effort. Speaking easily and comfortably, no accessory muscle usage nor intercostal retractions.   Abd:  No distention.  No tenderness to palpation of the abdomen. Other:  Patient identifies the pain as being in the mid thoracic region to the right of her spine with some mild tenderness to palpation of the spine but it seems to be more soft tissue to the right.  This radiates inferiorly but it is difficult to reproduce with palpation, mostly seems to occur when she moves around.   ED Results / Procedures / Treatments   Labs (all labs ordered are listed, but only abnormal results are displayed) Labs Reviewed  BASIC METABOLIC PANEL  CBC WITH DIFFERENTIAL/PLATELET     RADIOLOGY CTs pending at time of transfer of care   PROCEDURES:  Critical Care performed: No  .  1-3 Lead EKG Interpretation  Performed by: Loleta Rose, MD Authorized by: Loleta Rose, MD     Interpretation: normal     ECG rate:  85   ECG rate assessment: normal     Rhythm: sinus rhythm     Ectopy: none     Conduction: normal       IMPRESSION / MDM / ASSESSMENT AND PLAN / ED COURSE  I reviewed the triage vital signs and the nursing notes.                              Differential diagnosis includes, but is not limited to, discitis/osteomyelitis, nontraumatic compression fracture, musculoskeletal strain, renal disorder, neoplasm.  Patient's presentation is most consistent with acute presentation with potential threat to life or bodily function.  Labs/studies ordered: CTA chest, CT abdomen/pelvis, CT T-spine, CT L-spine, BMP, CBC with differential  Interventions/Medications given:  Medications  morphine (PF) 4 MG/ML injection 4 mg (4 mg Intravenous Given 04/08/23 0712)  ondansetron (ZOFRAN) injection 4 mg (4 mg Intravenous Given  04/08/23 0712)  sodium chloride 0.9 % bolus 500 mL (500 mLs Intravenous New Bag/Given 04/08/23 0711)    (Note:  hospital course my include additional interventions and/or labs/studies not listed above.)   Concern for the possibility of a subtle and chronic infection in her operative site that may be gradually worsening.  No acute neurological deficits at this time which is reassuring.  No respiratory difficulties or chest pain currently.  I will investigate with CT scans of the T and L-spine since the patient is not eligible for MRI.  Morphine and Zofran as described above for pain control and will check basic labs.  Patient and her son who is at bedside understand and agree with the plan.  The patient is on the cardiac monitor to evaluate for evidence of arrhythmia and/or significant heart rate changes.   Clinical Course as of 04/08/23 0720  Mon Apr 08, 2023  0981 Transferring ED care to Dr. Fanny Bien to follow-up on imaging and lab results and to reassess the patient.  When he and I were discussing the patient's symptoms, he brought up the good idea that a PE could cause similar pain.  Given that she is not very mobile with extensive chronic medical conditions, I think it is very reasonable to follow-up on this possibility.  I changed the initial orders from CT scans of the spine only to CTA chest PE protocol, CT abdomen/pelvis to look for intra-abdominal or retroperitoneal causes of her pain, with special attention to the T-spine and L-spine.  He will follow-up on the images [CF]    Clinical Course User Index [CF] Loleta Rose, MD     FINAL CLINICAL IMPRESSION(S) / ED DIAGNOSES   Final diagnoses:  Acute right-sided thoracic back pain     Rx / DC Orders   ED Discharge Orders     None        Note:  This document was prepared using Dragon voice recognition software and may include unintentional dictation errors.   Loleta Rose, MD 04/08/23 [phone]

## 2023-04-08 NOTE — Progress Notes (Signed)
+   pulmonary fibrosis on imaging Does not appear to be in acute exacerbation at present Will add on prn inhalers + inhaled budesonide for now  Consider steroid course if significant wheezing or decompensation of resp status. Monitor steroid use with vertebral fractures and osteoporosis

## 2023-04-08 NOTE — Assessment & Plan Note (Signed)
CT imaging with noted Spiculated soft tissue mass of the lower outer left breast, 3.8 x 3.3 cm, highly concerning for primary breast malignancy Follows Dr. Smith Robert outpt for iron deficiency anemia  Will notify Dr. Smith Robert as an FYI at the request of the family  Follow

## 2023-04-08 NOTE — Assessment & Plan Note (Addendum)
SSI Long acting insulin  A1c

## 2023-04-08 NOTE — Assessment & Plan Note (Signed)
Followed by Dr. Darrold Junker Status post permanent pacemaker Monitor

## 2023-04-08 NOTE — Assessment & Plan Note (Signed)
Baseline creatinine 1.2-1.5 with GFR in the 30s to 40s Creatinine 1.76 today with GFR in the upper 20s Mildly dry IV fluid hydration Minimize nephrotoxic agents

## 2023-04-08 NOTE — ED Triage Notes (Signed)
Pt from home for R sided back pain with Hx of back pain and back surgery.  Pt states pain is currently 4/10 after 50 mcg of IM fentanyl from EMS.

## 2023-04-08 NOTE — Progress Notes (Signed)
OT Cancellation Note  Patient Details Name: Emily Faulkner MRN: [id number] DOB: 1940/09/14   Cancelled Treatment:    Reason Eval/Treat Not Completed: Other (comment). Consult received, chart reviewed. Pt pending TLSO at this time per neurosurgery recommendation. RN confirmed TLSO not ordered yet. Will re-attempt OT evaluation once TLSO has arrived to optimize pain control.   Arman Filter., MPH, MS, OTR/L ascom [phone] 04/08/23, 4:27 PM

## 2023-04-08 NOTE — Treatment Plan (Signed)
Contacted by ED for 82 yo F with acute low back pain with PMH of HTN, non MRI compatible PM, pHTN, possible new diagnosis of breast CA, and prior T12 and L4 kyphoplastys.  CT chest demonstrates acute T10 endplate fx and chronic/sclerotic T3 compression fx.  Neuro intact  -no acute neurosurgical intervention -pain control and TLSO brace for comfort.  Follow up with neurosurgery in 2-4 weeks with upright xrays if pain still present -medical workup/management of patient's osteoporosis

## 2023-04-09 ENCOUNTER — Telehealth: Payer: Self-pay

## 2023-04-09 DIAGNOSIS — N1832 Chronic kidney disease, stage 3b: Secondary | ICD-10-CM

## 2023-04-09 DIAGNOSIS — S22070A Wedge compression fracture of T9-T10 vertebra, initial encounter for closed fracture: Secondary | ICD-10-CM | POA: Diagnosis not present

## 2023-04-09 DIAGNOSIS — N2889 Other specified disorders of kidney and ureter: Secondary | ICD-10-CM | POA: Diagnosis not present

## 2023-04-09 DIAGNOSIS — M810 Age-related osteoporosis without current pathological fracture: Secondary | ICD-10-CM | POA: Diagnosis not present

## 2023-04-09 DIAGNOSIS — M549 Dorsalgia, unspecified: Secondary | ICD-10-CM | POA: Diagnosis not present

## 2023-04-09 LAB — COMPREHENSIVE METABOLIC PANEL
ALT: 17 U/L (ref 0–44)
AST: 37 U/L (ref 15–41)
Albumin: 2.9 g/dL — ABNORMAL LOW (ref 3.5–5.0)
Alkaline Phosphatase: 63 U/L (ref 38–126)
Anion gap: 7 (ref 5–15)
BUN: 29 mg/dL — ABNORMAL HIGH (ref 8–23)
CO2: 20 mmol/L — ABNORMAL LOW (ref 22–32)
Calcium: 8.4 mg/dL — ABNORMAL LOW (ref 8.9–10.3)
Chloride: 109 mmol/L (ref 98–111)
Creatinine, Ser: 2.28 mg/dL — ABNORMAL HIGH (ref 0.44–1.00)
GFR, Estimated: 21 mL/min — ABNORMAL LOW (ref >60)
Glucose, Bld: 173 mg/dL — ABNORMAL HIGH (ref 70–99)
Potassium: 4.5 mmol/L (ref 3.5–5.1)
Sodium: 136 mmol/L (ref 135–145)
Total Bilirubin: 0.9 mg/dL (ref 0.3–1.2)
Total Protein: 5.8 g/dL — ABNORMAL LOW (ref 6.5–8.1)

## 2023-04-09 LAB — HEMOGLOBIN A1C
Hgb A1c MFr Bld: 7.5 % — ABNORMAL HIGH (ref 4.8–5.6)
Mean Plasma Glucose: 168.55 mg/dL

## 2023-04-09 LAB — CBC
HCT: 31.4 % — ABNORMAL LOW (ref 36.0–46.0)
Hemoglobin: 10.3 g/dL — ABNORMAL LOW (ref 12.0–15.0)
MCH: 32.8 pg (ref 26.0–34.0)
MCHC: 32.8 g/dL (ref 30.0–36.0)
MCV: 100 fL (ref 80.0–100.0)
Platelets: 86 K/uL — ABNORMAL LOW (ref 150–400)
RBC: 3.14 MIL/uL — ABNORMAL LOW (ref 3.87–5.11)
RDW: 14.9 % (ref 11.5–15.5)
WBC: 3.7 K/uL — ABNORMAL LOW (ref 4.0–10.5)
nRBC: 0 % (ref 0.0–0.2)

## 2023-04-09 LAB — GLUCOSE, CAPILLARY
Glucose-Capillary: 124 mg/dL — ABNORMAL HIGH (ref 70–99)
Glucose-Capillary: 134 mg/dL — ABNORMAL HIGH (ref 70–99)
Glucose-Capillary: 154 mg/dL — ABNORMAL HIGH (ref 70–99)
Glucose-Capillary: 159 mg/dL — ABNORMAL HIGH (ref 70–99)
Glucose-Capillary: 163 mg/dL — ABNORMAL HIGH (ref 70–99)

## 2023-04-09 MED ORDER — SENNOSIDES-DOCUSATE SODIUM 8.6-50 MG PO TABS
2.0000 | ORAL_TABLET | Freq: Two times a day (BID) | ORAL | Status: DC
  Administered 2023-04-09 – 2023-04-11 (×5): 2 via ORAL
  Filled 2023-04-09 (×5): qty 2

## 2023-04-09 MED ORDER — SENNOSIDES-DOCUSATE SODIUM 8.6-50 MG PO TABS: 2.0000 | ORAL_TABLET | Freq: Two times a day (BID) | ORAL | Status: DC

## 2023-04-09 MED ORDER — POLYETHYLENE GLYCOL 3350 17 G PO PACK
17.0000 g | PACK | Freq: Every day | ORAL | Status: DC
  Administered 2023-04-09 – 2023-04-11 (×3): 17 g via ORAL
  Filled 2023-04-09 (×3): qty 1

## 2023-04-09 NOTE — Plan of Care (Signed)
  Problem: Education: Goal: Knowledge of General Education information will improve Description: Including pain rating scale, medication(s)/side effects and non-pharmacologic comfort measures Outcome: Progressing   Problem: Clinical Measurements: Goal: Will remain free from infection Outcome: Progressing Goal: Respiratory complications will improve Outcome: Progressing   Problem: Elimination: Goal: Will not experience complications related to bowel motility Outcome: Progressing Goal: Will not experience complications related to urinary retention Outcome: Progressing   Problem: Pain Managment: Goal: General experience of comfort will improve Outcome: Progressing

## 2023-04-09 NOTE — Consult Note (Signed)
Consult requested by:  Dr. Sherryll Burger  Consult requested for:  Thoracic compression fractures  Primary Physician:  Marina Goodell, MD  History of Present Illness: 04/09/2023 Emily Faulkner is here today with a chief complaint of mid back pain for 1 week, worsening yesterday and prompting presentation to the ER.  She denies any new symptoms in her legs such as weakness, but is having some sciatica.  She previously has suffered from this. She does not have any new symptoms of bowel or bladder incontinence.  She previously had kyphoplasties which helped her.    Review of Systems:  A 10 point review of systems is negative, except for the pertinent positives and negatives detailed in the HPI.  Past Medical History: Past Medical History:  Diagnosis Date   Acute kidney failure (HCC) 11/16/2020   AKI (acute kidney injury) (HCC) 03/17/2018   Anemia    Cancer (HCC)    breast cancer right mastectomy   Diabetes mellitus without complication (HCC)    GIB (gastrointestinal bleeding) 02/20/2018   Glaucoma    Hyperkalemia 10/24/2020   Hyperlipidemia    Hypertension    Incontinence of bowel    Neuropathy    Odynophagia 10/24/2020   Osteoarthritis    Presence of permanent cardiac pacemaker    Rheumatoid arthritis (HCC)    Syncope and collapse 10/24/2020   Telangiectasias     Past Surgical History: Past Surgical History:  Procedure Laterality Date   BREAST SURGERY     right mastectomy   CHOLECYSTECTOMY     COLONOSCOPY     COLONOSCOPY WITH PROPOFOL N/A 08/14/2016   Procedure: COLONOSCOPY WITH PROPOFOL;  Surgeon: Christena Deem, MD;  Location: [facility] ENDOSCOPY;  Service: Endoscopy;  Laterality: N/A;   COLONOSCOPY WITH PROPOFOL N/A 02/22/2018   Procedure: COLONOSCOPY WITH PROPOFOL;  Surgeon: Wyline Mood, MD;  Location: [facility] ENDOSCOPY;  Service: Gastroenterology;  Laterality: N/A;   ENTEROSCOPY N/A 04/12/2021   Procedure: ENTEROSCOPY;  Surgeon: Toney Reil, MD;  Location:  [facility] ENDOSCOPY;  Service: Gastroenterology;  Laterality: N/A;  push   ESOPHAGOGASTRODUODENOSCOPY (EGD) WITH PROPOFOL N/A 08/14/2016   Procedure: ESOPHAGOGASTRODUODENOSCOPY (EGD) WITH PROPOFOL;  Surgeon: Christena Deem, MD;  Location: [facility] ENDOSCOPY;  Service: Endoscopy;  Laterality: N/A;   ESOPHAGOGASTRODUODENOSCOPY (EGD) WITH PROPOFOL N/A 01/30/2018   Procedure: ESOPHAGOGASTRODUODENOSCOPY (EGD) WITH PROPOFOL;  Surgeon: Toledo, Boykin Nearing, MD;  Location: [facility] ENDOSCOPY;  Service: Gastroenterology;  Laterality: N/A;   ESOPHAGOGASTRODUODENOSCOPY (EGD) WITH PROPOFOL N/A 02/22/2018   Procedure: ESOPHAGOGASTRODUODENOSCOPY (EGD) WITH PROPOFOL;  Surgeon: Wyline Mood, MD;  Location: [facility] ENDOSCOPY;  Service: Gastroenterology;  Laterality: N/A;   EXCISIONAL HEMORRHOIDECTOMY     IR RADIOLOGIST EVAL & MGMT  08/07/2022   IR RADIOLOGIST EVAL & MGMT  09/12/2022   KYPHOPLASTY N/A 03/20/2018   Procedure: [id number];  Surgeon: Kennedy Bucker, MD;  Location: [facility] ORS;  Service: Orthopedics;  Laterality: N/A;   PACEMAKER IMPLANT N/A 10/26/2020   Procedure: PACEMAKER IMPLANT;  Surgeon: Marcina Millard, MD;  Location: [facility] INVASIVE CV LAB;  Service: Cardiovascular;  Laterality: N/A;   RADIOLOGY WITH ANESTHESIA N/A 08/31/2022   Procedure: CT renal microwave ablation;  Surgeon: Pernell Dupre, MD;  Location: [facility] OR;  Service: Radiology;  Laterality: N/A;    Allergies: Allergies as of 04/08/2023   (No Known Allergies)    Medications: Current Meds  Medication Sig   SANDOSTATIN LAR DEPOT 20 MG injection Inject 20 mg into the muscle every 28 (twenty-eight) days.    Social History: Social History  Tobacco Use   Smoking status: Former    Current packs/day: 0.00    Types: Cigarettes    Quit date: 07/10/2011    Years since quitting: 11.7   Smokeless tobacco: Never  Vaping Use   Vaping status: Never Used  Substance Use Topics   Alcohol use: No   Drug use: No    Family Medical History: Family History   Problem Relation Age of Onset   Kidney cancer Son    Bladder Cancer Neg Hx     Physical Examination: Vitals:   04/09/23 1112 04/09/23 1727  BP: (!) 97/36 (!) 100/42  Pulse: 75 69  Resp: 14 16  Temp: 98 F (36.7 C) 98.3 F (36.8 C)  SpO2: 94% 97%    General: Patient is in no apparent distress. Attention to examination is appropriate.  Neck:   Supple.  Full range of motion.  Respiratory: Patient is breathing without any difficulty.   NEUROLOGICAL:     Awake, alert, oriented to person, place, and time.  Speech is clear and fluent.  Cranial Nerves: Pupils equal round and reactive to light.  Facial tone is symmetric.  Facial sensation is symmetric. Shoulder shrug is symmetric. Tongue protrusion is midline.    Moves BUE well  Side Iliopsoas Quads Hamstring PF DF EHL  R 5 5 5 5 5 5   L 5 5 5 5 5 5    Reflexes are 1+ and symmetric at the biceps, triceps, brachioradialis, patella and achilles.   Hoffman's is absent.   Bilateral upper and lower extremity sensation is intact to light touch.    No evidence of dysmetria noted.  Gait is untested.     Medical Decision Making  Imaging: CT T spine 04/08/2023 IMPRESSION: 1. Spiculated soft tissue mass of the lower outer left breast, which appears to involve the underlying pectoral musculature, measuring 3.8 x 3.3 cm, highly concerning for primary breast malignancy. 2. Redemonstrated lesion of the anterior midportion of the left kidney, slightly diminished in size with increased internal hypodensity and adjacent fat necrosis, generally in keeping with evolving post ablation change. Assessment for residual or recurrent viable tissue is very limited on noncontrast examination. 3. Age-indeterminate, sclerotic appearing superior endplate wedge deformity of T3, not previously imaged. 4. New, acute appearing non depressed superior endplate fracture of T10. 5. Unchanged wedge deformity of T12 status post vertebral  cement augmentation. 6. Unchanged superior endplate wedge deformity of L4, status post interval vertebral cement augmentation. 7. Mild pulmonary fibrosis in a pattern with apical to basal gradient, featuring irregular peripheral interstitial opacity, septal thickening, and ground-glass, with minimal traction bronchiectasis of the lung bases and minimal subpleural bronchiolectasis, without clear evidence of honeycombing. 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 8. Cirrhosis. No noncontrast evidence of focal liver lesion. Recanalization of the umbilical vein. 9. Endometrial thickening or fluid within the endometrial cavity measuring up to 1.1 cm, abnormal in the late postmenopausal setting. Recommend pelvic ultrasound on a nonemergent, outpatient basis for further evaluation when clinically appropriate. 10. Coronary artery disease.   Aortic Atherosclerosis (ICD10-I70.0).     Electronically Signed   By: Jearld Lesch M.D.   On: 04/08/2023 11:40  I have personally reviewed the images and agree with the above interpretation.  Assessment and Plan: Emily Faulkner is a pleasant 82 y.o. female with thoracic compression fractures.  - no surgery indicated - TLSO when OOB - follow up in 2-3 weeks    I have communicated my recommendations to the requesting physician  and coordinated care to facilitate these recommendations.     Israel Werts K. Myer Haff MD, [facility] Neurosurgery

## 2023-04-09 NOTE — Evaluation (Signed)
Physical Therapy Evaluation Patient Details Name: Emily Faulkner MRN: [id number] DOB: 11/08/1940 Today's Date: 04/09/2023  History of Present Illness  Patient is a 82 yo female that presented for intractable back pain, workup showed acute T10 endplate fx and chronic/sclerotic T3 compression fx.Marland Kitchen PMH of DM, CKDIII, HTN, pacemaker.   Clinical Impression  Pt A&Ox4, family at bedside. At rest pain in her back 3/10 and at end of session. Per pt she is normally modI for AdLs, ambulates in her home with [facility] and holds on to furniture, family/neighbors assist with IADLs.   The patient was able to perform rolling and sidelying to sit with minA and use of bed rails (sleeps in lift recliner at home). She was able to sit for an extended period of time to allow RN to administer medication, address L finger bleed, O2 monitoring (on room air mid 80s, placed on 2L for remainder of session/end of session), changing her clothes as well as TLSO adjustments/donning. She was able to sit <> Stand and ambulate ~23ft with RW and CGA. Effortful for pt and she endorsed fatigue at end of session, no LOB noted.  Overall the patient demonstrated deficits (see "PT Problem List") that impede the patient's functional abilities, safety, and mobility and would benefit from skilled PT intervention.          If plan is discharge home, recommend the following: A little help with walking and/or transfers;A little help with bathing/dressing/bathroom;Assistance with cooking/housework;Assist for transportation;Help with stairs or ramp for entrance   Can travel by private vehicle        Equipment Recommendations None recommended by PT  Recommendations for Other Services       Functional Status Assessment Patient has had a recent decline in their functional status and demonstrates the ability to make significant improvements in function in a reasonable and predictable amount of time.     Precautions / Restrictions  Precautions Precautions: Fall Required Braces or Orthoses: Other Brace Other Brace: TLSO Restrictions Weight Bearing Restrictions: No      Mobility  Bed Mobility Overal bed mobility: Needs Assistance Bed Mobility: Rolling, Sidelying to Sit   Sidelying to sit: Min assist       General bed mobility comments: log roll technique with verbal/tactile cues    Transfers Overall transfer level: Needs assistance Equipment used: Rolling walker (2 wheels) Transfers: Sit to/from Stand Sit to Stand: Contact guard assist                Ambulation/Gait Ambulation/Gait assistance: Contact guard assist Gait Distance (Feet): 25 Feet Assistive device: Rolling walker (2 wheels)         General Gait Details: very slow ,effortful but no LOB. pt reported fatigue at end of ambulation  Stairs            Wheelchair Mobility     Tilt Bed    Modified Rankin (Stroke Patients Only)       Balance Overall balance assessment: Needs assistance Sitting-balance support: Feet supported Sitting balance-Leahy Scale: Good     Standing balance support: Bilateral upper extremity supported Standing balance-Leahy Scale: Poor                               Pertinent Vitals/Pain Pain Assessment Pain Assessment: 0-10 Pain Score: 3  Pain Location: mid to low back on the R side Pain Descriptors / Indicators: Sore Pain Intervention(s): Limited activity within patient's tolerance, Monitored during session,  Repositioned    Home Living Family/patient expects to be discharged to:: Private residence Living Arrangements: Alone Available Help at Discharge: Family;Available PRN/intermittently Type of Home: House Home Access: Stairs to enter Entrance Stairs-Rails: Right Entrance Stairs-Number of Steps: 1, utilizes a grab bar and SPC   Home Layout: Able to live on main level with bedroom/bathroom Home Equipment: Cane - single point      Prior Function                Mobility Comments: utilizes SPC and furniture walks, sleeps in [employer], household ambulator only ADLs Comments: reported modI for ADLs, family/neighbors assist with IADLs     Extremity/Trunk Assessment   Upper Extremity Assessment Upper Extremity Assessment: Defer to OT evaluation    Lower Extremity Assessment Lower Extremity Assessment: Generalized weakness       Communication      Cognition Arousal: Alert Behavior During Therapy: WFL for tasks assessed/performed Overall Cognitive Status: Within Functional Limits for tasks assessed                                          General Comments      Exercises     Assessment/Plan    PT Assessment Patient needs continued PT services  PT Problem List Pain;Decreased activity tolerance;Decreased mobility;Decreased balance       PT Treatment Interventions DME instruction;Neuromuscular re-education;Gait training;Stair training;Patient/family education;Functional mobility training;Therapeutic activities;Therapeutic exercise;Balance training    PT Goals (Current goals can be found in the Care Plan section)  Acute Rehab PT Goals Patient Stated Goal: to have less pain PT Goal Formulation: With patient Time For Goal Achievement: 04/23/23 Potential to Achieve Goals: Good    Frequency Min 1X/week     Co-evaluation               AM-PAC PT "6 Clicks" Mobility  Outcome Measure Help needed turning from your back to your side while in a flat bed without using bedrails?: A Little Help needed moving from lying on your back to sitting on the side of a flat bed without using bedrails?: A Little Help needed moving to and from a bed to a chair (including a wheelchair)?: A Little Help needed standing up from a chair using your arms (e.g., wheelchair or bedside chair)?: A Little Help needed to walk in hospital room?: A Little Help needed climbing 3-5 steps with a railing? : A Little 6 Click Score: 18     End of Session   Activity Tolerance: Patient tolerated treatment well Patient left: in chair;with call bell/phone within reach;with chair alarm set;with family/visitor present Nurse Communication: Mobility status PT Visit Diagnosis: Other abnormalities of gait and mobility (R26.89);Difficulty in walking, not elsewhere classified (R26.2);Muscle weakness (generalized) (M62.81);Pain Pain - Right/Left:  (mildline/R) Pain - part of body:  (back)    Time: 4098-1191 PT Time Calculation (min) (ACUTE ONLY): 31 min   Charges:   PT Evaluation $PT Eval Low Complexity: 1 Low PT Treatments $Therapeutic Activity: 23-37 mins PT General Charges $$ ACUTE PT VISIT: 1 Visit         Olga Coaster PT, DPT 11:37 AM,04/09/23

## 2023-04-09 NOTE — Progress Notes (Signed)
1      PROGRESS NOTE    CHRSTINA MOENCH  ZOX:[id number] DOB: January 04, 1941 DOA: 04/08/2023 PCP: Marina Goodell, MD   Brief Narrative:   82 y.o. female with medical history significant of multiple medical use including CKD, complete heart block status post permanent pacemaker, osteoporosis, hypertension, hyperlipidemia, renal cancer status post ablation, presenting with intractable back pain. CT of the T and L-spine with noted new left breast 3.8 x 3 point centimeter mass concerning for breast malignancy, T3 superior endplate fracture, new T10 superior endplate fracture   10/1: PT, OT Neuro surgery, Palliative care c/s  Assessment & Plan:   Principal Problem:   Intractable back pain Active Problems:   Stage 3b chronic kidney disease (HCC)   Hypertension   Renal mass   Age-related osteoporosis without current pathological fracture   Symptomatic bradycardia   Breast cancer (HCC)  * Intractable back pain Acute onset of intractable mid and low back pain x 2 days CT imaging with noted with noted new wedge deformities at T3 and T10 consistent with pain distribution Noted prior T12 and L4 kyphoplastys  Neurosurgery recommends TLSO brace, Family requested to talk with Neurosurgery so I've reached out. They'll f/up with family PT OT evaluation - recommends HHPT, OT Continue Pain control - she has morphine IV ordered and encouraged to use if need   Stage 3b chronic kidney disease (HCC) Baseline creatinine 1.2-1.5 with GFR in the 30s to 40s Creatinine worsening Lab Results  Component Value Date   CREATININE 2.28 (H) 04/09/2023   CREATININE 1.74 (H) 04/08/2023   CREATININE 1.50 (H) 09/01/2022  Continue IV fluid hydration Avoid nephrotoxic agents   New Breast cancer (HCC) CT imaging with noted Spiculated soft tissue mass of the lower outer left breast, 3.8 x 3.3 cm, highly concerning for primary breast malignancy Follows Dr. Smith Robert outpt for iron deficiency anemia  I have notified Dr. Smith Robert  as an FYI at the request of the family - she will see her at the cancer center for further eval and treatment Cancer center will set office appt at [place]    Symptomatic bradycardia Followed by Dr. Darrold Junker Status post permanent pacemaker   Age-related osteoporosis with current pathological fracture Followed by endocrinology at the [facility] system On Fosamax, calcium, vitamin D Noted vertebral fractures with prior kyphoplasty   Renal mass Status post ablative therapy March 2024   Hypertension BP soft/low. No need of meds    Type 2 diabetes mellitus (HCC) Continue SSI, Long acting insulin and novolog TID with meals  Acute Hypoxic Respi failure 85% on RA and 92% on 2 liters. Likely due to pulmonary fibrosis. UIP pattern seen on CT   DVT prophylaxis:  enoxaparin (LOVENOX) injection 30 mg Start: 04/08/23 1630 Place TED hose Start: 04/08/23 1533     Code Status: (Full code Family Communication: Updated son and husband at bedside Disposition Plan: possible D/C in next 1-2 days depending on clinical condition and pain control   Consultants:  Neurosurgery   Subjective:  Sitting in the chair, pain 6/10. She is afraid to use stronger pain meds (at home uses tylenol even with having more pain). Son and Husband at bedside requesting to have talk with Neurosurgery  Objective: Vitals:   04/08/23 1500 04/09/23 0742 04/09/23 0817 04/09/23 1112  BP: (!) 111/37  (!) 91/37 (!) 97/36  Pulse: 77  76 75  Resp: 16  16 14   Temp: 97.8 F (36.6 C)  97.8 F (36.6 C) 98 F (36.7  C)  TempSrc: Oral     SpO2: 95% 94% (!) 85% 94%  Weight:      Height:        Intake/Output Summary (Last 24 hours) at 04/09/2023 1637 Last data filed at 04/09/2023 1034 Gross per 24 hour  Intake 1085.05 ml  Output 250 ml  Net 835.05 ml   Filed Weights   04/08/23 0544  Weight: 72.6 kg    Examination:  General exam: Appears calm and comfortable  Respiratory system: Decreased BS at the bases. Respiratory  effort normal. Cardiovascular system: Regular rate and rhythm Gastrointestinal system: Abdomen is soft, benign Central nervous system: Alert and oriented. No focal neurological deficits. Extremities: Symmetric 5 x 5 power. TSLO brace in place Skin: No rashes, lesions or ulcers Psychiatry: Judgement and insight appear normal. Mood & affect appropriate.     Data Reviewed: I have personally reviewed following labs and imaging studies  CBC: Recent Labs  Lab 04/08/23 0714 04/09/23 0705  WBC 5.5 3.7*  NEUTROABS 4.1  --   HGB 11.0* 10.3*  HCT 33.8* 31.4*  MCV 101.5* 100.0  PLT 115* 86*   Basic Metabolic Panel: Recent Labs  Lab 04/08/23 0916 04/09/23 0705  NA 141 136  K 4.0 4.5  CL 109 109  CO2 25 20*  GLUCOSE 178* 173*  BUN 25* 29*  CREATININE 1.74* 2.28*  CALCIUM 9.0 8.4*   GFR: Estimated Creatinine Clearance: 19 mL/min (A) (by C-G formula based on SCr of 2.28 mg/dL (H)). Liver Function Tests: Recent Labs  Lab 04/09/23 0705  AST 37  ALT 17  ALKPHOS 63  BILITOT 0.9  PROT 5.8*  ALBUMIN 2.9*    HbA1C: Recent Labs    04/09/23 0850  HGBA1C 7.5*   CBG: Recent Labs  Lab 04/08/23 1655 04/08/23 2100 04/09/23 0049 04/09/23 0819 04/09/23 1245  GLUCAP 120* 80 154* 163* 159*     Radiology Studies: CT L-SPINE NO CHARGE  Result Date: 04/08/2023 CLINICAL DATA:  Abdominal and flank pain, stone suspected, radiating back pain, history of cryoablation for left renal cell carcinoma * Tracking Code: BO * EXAM: CT CHEST, ABDOMEN AND PELVIS WITHOUT CONTRAST CT THORACIC AND LUMBAR SPINE WITHOUT CONTRAST TECHNIQUE: Multidetector CT imaging of the chest, abdomen and pelvis was performed following the standard protocol without IV contrast. Multidetector CT imaging of the thoracic and lumbar spine was performed following the standard protocol without IV contrast. RADIATION DOSE REDUCTION: This exam was performed according to the departmental dose-optimization program which  includes automated exposure control, adjustment of the mA and/or kV according to patient size and/or use of iterative reconstruction technique. COMPARISON:  None Available. FINDINGS: CT CHEST FINDINGS Cardiovascular: Left chest multi lead pacer. Aortic atherosclerosis. Three-vessel coronary artery calcifications. Normal heart size. No pericardial effusion. Mediastinum/Nodes: No enlarged mediastinal, hilar, or axillary lymph nodes. Thyroid gland, trachea, and esophagus demonstrate no significant findings. Lungs/Pleura: Mild pulmonary fibrosis in a pattern with apical to basal gradient, featuring irregular peripheral interstitial opacity, septal thickening, and ground-glass, with minimal traction bronchiectasis of the lung bases and minimal subpleural bronchiolectasis, without clear evidence of honeycombing. No pleural effusion or pneumothorax. Musculoskeletal: Spiculated soft tissue mass of the lower outer left breast, which appears to involve the underlying pectoral musculature, measuring 3.8 x 3.3 cm (series 2, image 33). Status post right mastectomy and axillary lymph node dissection. No suspicious osseous lesions identified. CT ABDOMEN PELVIS FINDINGS Hepatobiliary: Coarse, nodular cirrhotic morphology of the liver. No noncontrast evidence of focal liver lesion. Status post cholecystectomy. No  biliary ductal dilatation. Pancreas: Unremarkable. No pancreatic ductal dilatation or surrounding inflammatory changes. Spleen: Normal in size without significant abnormality. Adrenals/Urinary Tract: Adrenal glands are unremarkable. Redemonstrated lesion of the anterior midportion of the left kidney measuring 3.3 x 2.5 cm, previously 3.8 x 3.3 cm, with increased internal hypodensity and adjacent fat necrosis (series 2, image 68). The right kidney is normal in noncontrast appearance without renal calculi, obvious solid lesion, or hydronephrosis. Bladder is unremarkable. Stomach/Bowel: Stomach is within normal limits.  Appendix not clearly visualized. No evidence of bowel wall thickening, distention, or inflammatory changes. Sigmoid diverticulosis. Vascular/Lymphatic: Severe aortic atherosclerosis. Recanalization of the umbilical vein as well as varices about the left upper quadrant. No enlarged abdominal or pelvic lymph nodes. Reproductive: Endometrial thickening or fluid within the endometrial cavity measuring up to 1.1 cm, abnormal in the late postmenopausal setting (series 6, image 72). Other: No abdominal wall hernia or abnormality. No ascites. Musculoskeletal: No acute osseous findings. CT THORACIC AND LUMBAR SPINE FINDINGS Alignment: Straightening of the normal thoracic kyphosis and normal lumbar lordosis. Mild, focal kyphosis at T12 secondary to wedge deformity. Vertebral bodies: Age-indeterminate, sclerotic appearing superior endplate wedge deformity T3 (series 6, image 70). New, acute appearing non depressed superior endplate fracture of T10 (series 6, image 69). Unchanged wedge deformity of T12 status post vertebral cement augmentation. Unchanged superior endplate wedge deformity of L4, status post interval vertebral cement augmentation (series 6, image 73). Disc spaces: Focally mild disc degenerative disease throughout the thoracic spine. Focally severe disc degenerative disease of T11 through L1 and L2-L3 with otherwise mild disc degenerative disease of the lumbar spine. Mild-to-moderate facet degenerative change throughout the lower lumbar spine. Paraspinous soft tissues: Unremarkable. IMPRESSION: 1. Spiculated soft tissue mass of the lower outer left breast, which appears to involve the underlying pectoral musculature, measuring 3.8 x 3.3 cm, highly concerning for primary breast malignancy. 2. Redemonstrated lesion of the anterior midportion of the left kidney, slightly diminished in size with increased internal hypodensity and adjacent fat necrosis, generally in keeping with evolving post ablation change.  Assessment for residual or recurrent viable tissue is very limited on noncontrast examination. 3. Age-indeterminate, sclerotic appearing superior endplate wedge deformity of T3, not previously imaged. 4. New, acute appearing non depressed superior endplate fracture of T10. 5. Unchanged wedge deformity of T12 status post vertebral cement augmentation. 6. Unchanged superior endplate wedge deformity of L4, status post interval vertebral cement augmentation. 7. Mild pulmonary fibrosis in a pattern with apical to basal gradient, featuring irregular peripheral interstitial opacity, septal thickening, and ground-glass, with minimal traction bronchiectasis of the lung bases and minimal subpleural bronchiolectasis, without clear evidence of honeycombing. 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 8. Cirrhosis. No noncontrast evidence of focal liver lesion. Recanalization of the umbilical vein. 9. Endometrial thickening or fluid within the endometrial cavity measuring up to 1.1 cm, abnormal in the late postmenopausal setting. Recommend pelvic ultrasound on a nonemergent, outpatient basis for further evaluation when clinically appropriate. 10. Coronary artery disease. Aortic Atherosclerosis (ICD10-I70.0). Electronically Signed   By: Jearld Lesch M.D.   On: 04/08/2023 11:40   CT CHEST ABDOMEN PELVIS WO CONTRAST  Result Date: 04/08/2023 CLINICAL DATA:  Abdominal and flank pain, stone suspected, radiating back pain, history of cryoablation for left renal cell carcinoma * Tracking Code: BO * EXAM: CT CHEST, ABDOMEN AND PELVIS WITHOUT CONTRAST CT THORACIC AND LUMBAR SPINE WITHOUT CONTRAST TECHNIQUE: Multidetector CT imaging of the chest, abdomen and pelvis was performed following the standard protocol without IV  contrast. Multidetector CT imaging of the thoracic and lumbar spine was performed following the standard protocol without IV contrast. RADIATION DOSE REDUCTION: This exam was performed  according to the departmental dose-optimization program which includes automated exposure control, adjustment of the mA and/or kV according to patient size and/or use of iterative reconstruction technique. COMPARISON:  None Available. FINDINGS: CT CHEST FINDINGS Cardiovascular: Left chest multi lead pacer. Aortic atherosclerosis. Three-vessel coronary artery calcifications. Normal heart size. No pericardial effusion. Mediastinum/Nodes: No enlarged mediastinal, hilar, or axillary lymph nodes. Thyroid gland, trachea, and esophagus demonstrate no significant findings. Lungs/Pleura: Mild pulmonary fibrosis in a pattern with apical to basal gradient, featuring irregular peripheral interstitial opacity, septal thickening, and ground-glass, with minimal traction bronchiectasis of the lung bases and minimal subpleural bronchiolectasis, without clear evidence of honeycombing. No pleural effusion or pneumothorax. Musculoskeletal: Spiculated soft tissue mass of the lower outer left breast, which appears to involve the underlying pectoral musculature, measuring 3.8 x 3.3 cm (series 2, image 33). Status post right mastectomy and axillary lymph node dissection. No suspicious osseous lesions identified. CT ABDOMEN PELVIS FINDINGS Hepatobiliary: Coarse, nodular cirrhotic morphology of the liver. No noncontrast evidence of focal liver lesion. Status post cholecystectomy. No biliary ductal dilatation. Pancreas: Unremarkable. No pancreatic ductal dilatation or surrounding inflammatory changes. Spleen: Normal in size without significant abnormality. Adrenals/Urinary Tract: Adrenal glands are unremarkable. Redemonstrated lesion of the anterior midportion of the left kidney measuring 3.3 x 2.5 cm, previously 3.8 x 3.3 cm, with increased internal hypodensity and adjacent fat necrosis (series 2, image 68). The right kidney is normal in noncontrast appearance without renal calculi, obvious solid lesion, or hydronephrosis. Bladder is  unremarkable. Stomach/Bowel: Stomach is within normal limits. Appendix not clearly visualized. No evidence of bowel wall thickening, distention, or inflammatory changes. Sigmoid diverticulosis. Vascular/Lymphatic: Severe aortic atherosclerosis. Recanalization of the umbilical vein as well as varices about the left upper quadrant. No enlarged abdominal or pelvic lymph nodes. Reproductive: Endometrial thickening or fluid within the endometrial cavity measuring up to 1.1 cm, abnormal in the late postmenopausal setting (series 6, image 72). Other: No abdominal wall hernia or abnormality. No ascites. Musculoskeletal: No acute osseous findings. CT THORACIC AND LUMBAR SPINE FINDINGS Alignment: Straightening of the normal thoracic kyphosis and normal lumbar lordosis. Mild, focal kyphosis at T12 secondary to wedge deformity. Vertebral bodies: Age-indeterminate, sclerotic appearing superior endplate wedge deformity T3 (series 6, image 70). New, acute appearing non depressed superior endplate fracture of T10 (series 6, image 69). Unchanged wedge deformity of T12 status post vertebral cement augmentation. Unchanged superior endplate wedge deformity of L4, status post interval vertebral cement augmentation (series 6, image 73). Disc spaces: Focally mild disc degenerative disease throughout the thoracic spine. Focally severe disc degenerative disease of T11 through L1 and L2-L3 with otherwise mild disc degenerative disease of the lumbar spine. Mild-to-moderate facet degenerative change throughout the lower lumbar spine. Paraspinous soft tissues: Unremarkable. IMPRESSION: 1. Spiculated soft tissue mass of the lower outer left breast, which appears to involve the underlying pectoral musculature, measuring 3.8 x 3.3 cm, highly concerning for primary breast malignancy. 2. Redemonstrated lesion of the anterior midportion of the left kidney, slightly diminished in size with increased internal hypodensity and adjacent fat necrosis,  generally in keeping with evolving post ablation change. Assessment for residual or recurrent viable tissue is very limited on noncontrast examination. 3. Age-indeterminate, sclerotic appearing superior endplate wedge deformity of T3, not previously imaged. 4. New, acute appearing non depressed superior endplate fracture of T10. 5. Unchanged wedge deformity of  T12 status post vertebral cement augmentation. 6. Unchanged superior endplate wedge deformity of L4, status post interval vertebral cement augmentation. 7. Mild pulmonary fibrosis in a pattern with apical to basal gradient, featuring irregular peripheral interstitial opacity, septal thickening, and ground-glass, with minimal traction bronchiectasis of the lung bases and minimal subpleural bronchiolectasis, without clear evidence of honeycombing. 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 8. Cirrhosis. No noncontrast evidence of focal liver lesion. Recanalization of the umbilical vein. 9. Endometrial thickening or fluid within the endometrial cavity measuring up to 1.1 cm, abnormal in the late postmenopausal setting. Recommend pelvic ultrasound on a nonemergent, outpatient basis for further evaluation when clinically appropriate. 10. Coronary artery disease. Aortic Atherosclerosis (ICD10-I70.0). Electronically Signed   By: Jearld Lesch M.D.   On: 04/08/2023 11:40   CT T-SPINE NO CHARGE  Result Date: 04/08/2023 CLINICAL DATA:  Abdominal and flank pain, stone suspected, radiating back pain, history of cryoablation for left renal cell carcinoma * Tracking Code: BO * EXAM: CT CHEST, ABDOMEN AND PELVIS WITHOUT CONTRAST CT THORACIC AND LUMBAR SPINE WITHOUT CONTRAST TECHNIQUE: Multidetector CT imaging of the chest, abdomen and pelvis was performed following the standard protocol without IV contrast. Multidetector CT imaging of the thoracic and lumbar spine was performed following the standard protocol without IV contrast. RADIATION DOSE  REDUCTION: This exam was performed according to the departmental dose-optimization program which includes automated exposure control, adjustment of the mA and/or kV according to patient size and/or use of iterative reconstruction technique. COMPARISON:  None Available. FINDINGS: CT CHEST FINDINGS Cardiovascular: Left chest multi lead pacer. Aortic atherosclerosis. Three-vessel coronary artery calcifications. Normal heart size. No pericardial effusion. Mediastinum/Nodes: No enlarged mediastinal, hilar, or axillary lymph nodes. Thyroid gland, trachea, and esophagus demonstrate no significant findings. Lungs/Pleura: Mild pulmonary fibrosis in a pattern with apical to basal gradient, featuring irregular peripheral interstitial opacity, septal thickening, and ground-glass, with minimal traction bronchiectasis of the lung bases and minimal subpleural bronchiolectasis, without clear evidence of honeycombing. No pleural effusion or pneumothorax. Musculoskeletal: Spiculated soft tissue mass of the lower outer left breast, which appears to involve the underlying pectoral musculature, measuring 3.8 x 3.3 cm (series 2, image 33). Status post right mastectomy and axillary lymph node dissection. No suspicious osseous lesions identified. CT ABDOMEN PELVIS FINDINGS Hepatobiliary: Coarse, nodular cirrhotic morphology of the liver. No noncontrast evidence of focal liver lesion. Status post cholecystectomy. No biliary ductal dilatation. Pancreas: Unremarkable. No pancreatic ductal dilatation or surrounding inflammatory changes. Spleen: Normal in size without significant abnormality. Adrenals/Urinary Tract: Adrenal glands are unremarkable. Redemonstrated lesion of the anterior midportion of the left kidney measuring 3.3 x 2.5 cm, previously 3.8 x 3.3 cm, with increased internal hypodensity and adjacent fat necrosis (series 2, image 68). The right kidney is normal in noncontrast appearance without renal calculi, obvious solid lesion, or  hydronephrosis. Bladder is unremarkable. Stomach/Bowel: Stomach is within normal limits. Appendix not clearly visualized. No evidence of bowel wall thickening, distention, or inflammatory changes. Sigmoid diverticulosis. Vascular/Lymphatic: Severe aortic atherosclerosis. Recanalization of the umbilical vein as well as varices about the left upper quadrant. No enlarged abdominal or pelvic lymph nodes. Reproductive: Endometrial thickening or fluid within the endometrial cavity measuring up to 1.1 cm, abnormal in the late postmenopausal setting (series 6, image 72). Other: No abdominal wall hernia or abnormality. No ascites. Musculoskeletal: No acute osseous findings. CT THORACIC AND LUMBAR SPINE FINDINGS Alignment: Straightening of the normal thoracic kyphosis and normal lumbar lordosis. Mild, focal kyphosis at T12 secondary to wedge deformity. Vertebral  bodies: Age-indeterminate, sclerotic appearing superior endplate wedge deformity T3 (series 6, image 70). New, acute appearing non depressed superior endplate fracture of T10 (series 6, image 69). Unchanged wedge deformity of T12 status post vertebral cement augmentation. Unchanged superior endplate wedge deformity of L4, status post interval vertebral cement augmentation (series 6, image 73). Disc spaces: Focally mild disc degenerative disease throughout the thoracic spine. Focally severe disc degenerative disease of T11 through L1 and L2-L3 with otherwise mild disc degenerative disease of the lumbar spine. Mild-to-moderate facet degenerative change throughout the lower lumbar spine. Paraspinous soft tissues: Unremarkable. IMPRESSION: 1. Spiculated soft tissue mass of the lower outer left breast, which appears to involve the underlying pectoral musculature, measuring 3.8 x 3.3 cm, highly concerning for primary breast malignancy. 2. Redemonstrated lesion of the anterior midportion of the left kidney, slightly diminished in size with increased internal hypodensity and  adjacent fat necrosis, generally in keeping with evolving post ablation change. Assessment for residual or recurrent viable tissue is very limited on noncontrast examination. 3. Age-indeterminate, sclerotic appearing superior endplate wedge deformity of T3, not previously imaged. 4. New, acute appearing non depressed superior endplate fracture of T10. 5. Unchanged wedge deformity of T12 status post vertebral cement augmentation. 6. Unchanged superior endplate wedge deformity of L4, status post interval vertebral cement augmentation. 7. Mild pulmonary fibrosis in a pattern with apical to basal gradient, featuring irregular peripheral interstitial opacity, septal thickening, and ground-glass, with minimal traction bronchiectasis of the lung bases and minimal subpleural bronchiolectasis, without clear evidence of honeycombing. 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 8. Cirrhosis. No noncontrast evidence of focal liver lesion. Recanalization of the umbilical vein. 9. Endometrial thickening or fluid within the endometrial cavity measuring up to 1.1 cm, abnormal in the late postmenopausal setting. Recommend pelvic ultrasound on a nonemergent, outpatient basis for further evaluation when clinically appropriate. 10. Coronary artery disease. Aortic Atherosclerosis (ICD10-I70.0). Electronically Signed   By: Jearld Lesch M.D.   On: 04/08/2023 11:40        Scheduled Meds:  budesonide (PULMICORT) nebulizer solution  0.5 mg Nebulization BID   enoxaparin (LOVENOX) injection  30 mg Subcutaneous Q24H   insulin aspart  0-9 Units Subcutaneous TID WC   insulin aspart  3 Units Subcutaneous TID WC   insulin detemir  15 Units Subcutaneous QHS   polyethylene glycol  17 g Oral Daily   senna-docusate  2 tablet Oral BID   Continuous Infusions:  sodium chloride 75 mL/hr at 04/09/23 0550     LOS: 1 day    Time spent: 35 mins    Abrahan Fulmore Sherryll Burger, MD [facility] Pager [phone]  If  7PM-7AM, please contact night-coverage [email or web address] Password [facility] 04/09/2023, 4:37 PM

## 2023-04-09 NOTE — TOC Initial Note (Signed)
Transition of Care [facility]) - Initial/Assessment Note    Patient Details  Name: Emily Faulkner MRN: [id number] Date of Birth: Jun 06, 1941  Transition of Care [facility]) CM/SW Contact:    Marlowe Sax, RN Phone Number: 04/09/2023, 3:11 PM  Clinical Narrative:                 Met with the patient and her sons in the room  She lives at home alone but her sons rotate helping her They provide transportation She has DME at home [facility] to monitor if she will need Oxygen set up She is agreeable to home health and does not have a preference of agencies She has done it in the past but does not remember the name of the agency she used [facility]) will accept her for Home health Expected Discharge Plan: Home w Home Health Services Barriers to Discharge: Continued Medical Work up   Patient Goals and CMS Choice      Get well      Expected Discharge Plan and Services   Discharge Planning Services: CM Consult   Living arrangements for the past 2 months: Single Family Home                           HH Arranged: PT, OT HH Agency: [facility] Date [facility] Agency Contacted: 04/09/23 Time HH Agency Contacted: 1510 Representative spoke with at [facility] Agency: Kandee Keen  Prior Living Arrangements/Services Living arrangements for the past 2 months: Single Family Home Lives with:: Self Patient language and need for interpreter reviewed:: Yes Do you feel safe going back to the place where you live?: Yes      Need for Family Participation in Patient Care: Yes (Comment) Care giver support system in place?: Yes (comment) Current home services: DME (rolling walker, shower seat, lift chair, cane) Criminal Activity/Legal Involvement Pertinent to Current Situation/Hospitalization: No - Comment as needed  Activities of Daily Living   ADL Screening (condition at time of admission) Does the patient have a NEW difficulty with bathing/dressing/toileting/self-feeding that is expected to last >3 days?:  Yes (Initiates electronic notice to provider for possible OT consult) Does the patient have a NEW difficulty with getting in/out of bed, walking, or climbing stairs that is expected to last >3 days?: Yes (Initiates electronic notice to provider for possible PT consult) Does the patient have a NEW difficulty with communication that is expected to last >3 days?: No Is the patient deaf or have difficulty hearing?: No Does the patient have difficulty seeing, even when wearing glasses/contacts?: No Does the patient have difficulty concentrating, remembering, or making decisions?: No  Permission Sought/Granted   Permission granted to share information with : Yes, Verbal Permission Granted              Emotional Assessment Appearance:: Appears stated age Attitude/Demeanor/Rapport: Engaged, Ambitious Affect (typically observed): Accepting, Pleasant Orientation: : Oriented to Self, Oriented to Place, Oriented to  Time, Oriented to Situation Alcohol / Substance Use: Not Applicable Psych Involvement: No (comment)  Admission diagnosis:  Intractable back pain [M54.9] Acute right-sided thoracic back pain [M54.6] Patient Active Problem List   Diagnosis Date Noted   Intractable back pain 04/08/2023   Breast cancer (HCC) 04/08/2023   Renal mass, left 08/31/2022   Renal cancer (HCC) 08/31/2022   Acquired arteriovenous malformation of small intestine    Coronary artery disease involving native coronary artery of native heart 11/10/2020   Symptomatic bradycardia 11/10/2020  Stage 3b chronic kidney disease (HCC) 11/10/2020   Closed fracture of distal end of right fibula 10/28/2020   Complete heart block (HCC) 10/24/2020   Type 2 diabetes mellitus with stage 3 chronic kidney disease, with long-term current use of insulin (HCC) 09/16/2018   Age-related osteoporosis without current pathological fracture 05/09/2018   Renal mass 03/17/2018   Microalbuminuric diabetic nephropathy (HCC) 11/02/2016    Pulmonary hypertension (HCC) 09/11/2016   Hypertension 09/06/2016   High cholesterol 09/06/2016   Iron deficiency anemia 04/15/2016   Type 2 diabetes mellitus (HCC) 08/04/2014   Hyperlipidemia, unspecified 01/22/2014   Personal history of breast cancer 07/17/2012   PCP:  Marina Goodell, MD Pharmacy:   [facility] [address] [place] - [address] [place] [address] Phone: [phone] Fax: [phone]     Social Determinants of Health (SDOH) Social History: SDOH Screenings   Food Insecurity: No Food Insecurity (04/08/2023)  Housing: Low Risk  (04/08/2023)  Transportation Needs: No Transportation Needs (04/08/2023)  Utilities: Not At Risk (04/08/2023)  Depression (PHQ2-9): Low Risk  (02/05/2022)  Financial Resource Strain: Low Risk  (03/27/2023)   Received from [facility]  Tobacco Use: Medium Risk (04/08/2023)   SDOH Interventions:     Readmission Risk Interventions    10/28/2020    4:00 PM  Readmission Risk Prevention Plan  Transportation Screening Complete  PCP or Specialist Appt within 3-5 Days Complete  HRI or Home Care Consult Complete  Social Work Consult for Recovery Care Planning/Counseling Complete  Palliative Care Screening Not Applicable  Medication Review Oceanographer) Complete

## 2023-04-09 NOTE — Telephone Encounter (Signed)
The ER called Dr Alta Corning about this patient. Per Dr Alta Corning, please schedule a new patient appointment with one of the PA's in 2-4 weeks for thoracic fractures. She will need xrays that day. Thanks!

## 2023-04-09 NOTE — Evaluation (Signed)
Occupational Therapy Evaluation Patient Details Name: Emily Faulkner MRN: [id number] DOB: 1940/12/17 Today's Date: 04/09/2023   History of Present Illness Pt is an 82 year old female presenting to the ED with sudden onset of mid and low back pain, CT imaging with noted with noted new wedge deformities at T3 and T10 consistent with pain distribution  Noted prior T12 and L4 kyphoplastys   Neurosurgery preliminary consulted with plan for TLSO brace, noted Spiculated soft tissue mass of the lower outer left breast, 3.8 x 3.3 cm    PMH significant for  CKD, complete heart block status post permanent pacemaker, osteoporosis, hypertension, hyperlipidemia, renal cancer status post ablation   Clinical Impression   Chart reviewed, pt greeted in chair, agreeable to OT evaluation. TLSO donned throughout. Pt is alert and oriented x4. PTA pt is generally MOD I for ADLs, assist for IADLs from neighbors/family. She amb household distances with SPC. Pt presents with deficits in strength, endurance, acgivity tolerance, balance affecting safe and optimal ADL completion. CGA required for toilet transfers, amb approx 15' in room 2 attempts with RW with CGA, LB dressing with MOD A, toileting with MIN A for thoroughness due to decreased ability for trunk rotation. Pt and son educated re use of AE for improved ADL performance. Pt will benefit from post acute OT to address deficits and to facilitate optimal ADL completion. OT will follow acutely.       If plan is discharge home, recommend the following: A little help with walking and/or transfers;A little help with bathing/dressing/bathroom;Assistance with cooking/housework;Assist for transportation;Help with stairs or ramp for entrance    Functional Status Assessment  Patient has had a recent decline in their functional status and demonstrates the ability to make significant improvements in function in a reasonable and predictable amount of time.  Equipment  Recommendations  None recommended by OT;Other (comment) (pt reports she has bsc)    Recommendations for Other Services       Precautions / Restrictions Precautions Precautions: Fall Required Braces or Orthoses: Other Brace Spinal Brace: Thoracolumbosacral orthotic Other Brace: may remove to shower Restrictions Weight Bearing Restrictions: No      Mobility Bed Mobility               General bed mobility comments: educated re: appropriate body mechanics for bed mobility, not formally tested as pt in recliner pre/post session    Transfers Overall transfer level: Needs assistance Equipment used: Rolling walker (2 wheels) Transfers: Sit to/from Stand Sit to Stand: Contact guard assist                  Balance Overall balance assessment: Needs assistance Sitting-balance support: Feet supported Sitting balance-Leahy Scale: Good     Standing balance support: Bilateral upper extremity supported Standing balance-Leahy Scale: Poor                             ADL either performed or assessed with clinical judgement   ADL Overall ADL's : Needs assistance/impaired Eating/Feeding: Set up;Sitting   Grooming: Wash/dry hands;Set up;Sitting               Lower Body Dressing: Moderate assistance Lower Body Dressing Details (indicate cue type and reason): underear; discussed use of AE PRN, pt is able to perform figure 4 to simulate doffing socks Toilet Transfer: Contact guard assist;Rolling walker (2 wheels);Regular Toilet;Grab bars Toilet Transfer Details (indicate cue type and reason): intermittent vcs for technique Toileting-  Clothing Manipulation and Hygiene: Minimal assistance Toileting - Clothing Manipulation Details (indicate cue type and reason): for thoroughness, pt reported previous pain with twisting to wipe (prior to hospitalization)     Functional mobility during ADLs: Contact guard assist;Rolling walker (2 wheels) (approx 15' 2 attemps with  RW)       Vision Patient Visual Report: No change from baseline       Perception         Praxis         Pertinent Vitals/Pain Pain Assessment Pain Assessment: 0-10 Pain Score: 3  Pain Location: back apin Pain Descriptors / Indicators: Discomfort, Sore Pain Intervention(s): Limited activity within patient's tolerance, Monitored during session, Repositioned     Extremity/Trunk Assessment Upper Extremity Assessment Upper Extremity Assessment: Generalized weakness   Lower Extremity Assessment Lower Extremity Assessment: Generalized weakness       Communication Communication Communication: No apparent difficulties Cueing Techniques: Verbal cues;Visual cues   Cognition Arousal: Alert Behavior During Therapy: WFL for tasks assessed/performed Overall Cognitive Status: Within Functional Limits for tasks assessed                                       General Comments  BP 87/55 (MAP 66) seated in chair following mobility, then 99/39 (MAP 56) with legs raised. HR in the 70s throughout. Pt spo2 >90% on 2 L via [place] throughout Pt is asymptomatic. Nurse notified. TLSO donned thorughout session.    Exercises Other Exercises Other Exercises: edu pt and son re: role of OT, role of rehab discharge recommendations, home safety, DME use, falls prevention, ADL completion with PRN use of AE/DME, TLSO care   Shoulder Instructions      Home Living Family/patient expects to be discharged to:: Private residence Living Arrangements: Alone Available Help at Discharge: Family;Available PRN/intermittently (son in room reports they will take turn staying with patient as needed) Type of Home: House Home Access: Stairs to enter [employer] of Steps: 1, utilizes a grab bar and SPC Entrance Stairs-Rails: Right Home Layout: Able to live on main level with bedroom/bathroom     Bathroom Shower/Tub: Producer, television/film/video: Standard     Home Equipment:  Cane - single Librarian, academic (2 wheels);BSC/3in1;Shower seat          Prior Functioning/Environment Prior Level of Function : Independent/Modified Independent             Mobility Comments: SPC household distances, sleeps in Metallurgist ADLs Comments: generally MOD I with ADL; assist for IADLs as needed- pt neighbor goes grocery shopping for her, she prepares simple meals;        OT Problem List: Decreased strength;Decreased activity tolerance;Impaired balance (sitting and/or standing);Decreased knowledge of use of DME or AE      OT Treatment/Interventions: Self-care/ADL training;Balance training;Therapeutic exercise;Therapeutic activities;DME and/or AE instruction;Patient/family education    OT Goals(Current goals can be found in the care plan section) Acute Rehab OT Goals Patient Stated Goal: go home OT Goal Formulation: With patient Time For Goal Achievement: 04/23/23 Potential to Achieve Goals: Good ADL Goals Pt Will Perform Grooming: with modified independence;sitting;standing Pt Will Perform Upper Body Dressing: with caregiver independent in assisting;with set-up Pt Will Perform Lower Body Dressing: with modified independence;sitting/lateral leans Pt Will Transfer to Toilet: with modified independence;ambulating Pt Will Perform Toileting - Clothing Manipulation and hygiene: with modified independence;sitting/lateral leans  OT Frequency: Min 1X/week  Co-evaluation              AM-PAC OT "6 Clicks" Daily Activity     Outcome Measure Help from another person eating meals?: None Help from another person taking care of personal grooming?: None Help from another person toileting, which includes using toliet, bedpan, or urinal?: A Little Help from another person bathing (including washing, rinsing, drying)?: A Little Help from another person to put on and taking off regular upper body clothing?: None Help from another person to put on and taking off regular  lower body clothing?: A Little 6 Click Score: 21   End of Session Equipment Utilized During Treatment: Rolling walker (2 wheels);Oxygen Nurse Communication: Mobility status;Other (comment) (vitals)  Activity Tolerance: Patient tolerated treatment well Patient left: in chair;with call bell/phone within reach;with chair alarm set;with family/visitor present  OT Visit Diagnosis: Other abnormalities of gait and mobility (R26.89);Muscle weakness (generalized) (M62.81)                Time: 1610-9604 OT Time Calculation (min): 30 min Charges:  OT General Charges $OT Visit: 1 Visit OT Evaluation $OT Eval Moderate Complexity: 1 Mod  Oleta Mouse, OTD OTR/L  04/09/23, 1:01 PM

## 2023-04-10 DIAGNOSIS — N1832 Chronic kidney disease, stage 3b: Secondary | ICD-10-CM | POA: Diagnosis not present

## 2023-04-10 DIAGNOSIS — N6323 Unspecified lump in the left breast, lower outer quadrant: Secondary | ICD-10-CM

## 2023-04-10 DIAGNOSIS — S22070A Wedge compression fracture of T9-T10 vertebra, initial encounter for closed fracture: Secondary | ICD-10-CM | POA: Diagnosis not present

## 2023-04-10 DIAGNOSIS — M549 Dorsalgia, unspecified: Secondary | ICD-10-CM | POA: Diagnosis not present

## 2023-04-10 DIAGNOSIS — Z7189 Other specified counseling: Secondary | ICD-10-CM | POA: Diagnosis not present

## 2023-04-10 LAB — BASIC METABOLIC PANEL
Anion gap: 5 (ref 5–15)
BUN: 30 mg/dL — ABNORMAL HIGH (ref 8–23)
CO2: 24 mmol/L (ref 22–32)
Calcium: 8.5 mg/dL — ABNORMAL LOW (ref 8.9–10.3)
Chloride: 110 mmol/L (ref 98–111)
Creatinine, Ser: 2.04 mg/dL — ABNORMAL HIGH (ref 0.44–1.00)
GFR, Estimated: 24 mL/min — ABNORMAL LOW (ref >60)
Glucose, Bld: 76 mg/dL (ref 70–99)
Potassium: 3.9 mmol/L (ref 3.5–5.1)
Sodium: 139 mmol/L (ref 135–145)

## 2023-04-10 LAB — CBC
HCT: 27.5 % — ABNORMAL LOW (ref 36.0–46.0)
Hemoglobin: 9 g/dL — ABNORMAL LOW (ref 12.0–15.0)
MCH: 32.8 pg (ref 26.0–34.0)
MCHC: 32.7 g/dL (ref 30.0–36.0)
MCV: 100.4 fL — ABNORMAL HIGH (ref 80.0–100.0)
Platelets: 88 10*3/uL — ABNORMAL LOW (ref 150–400)
RBC: 2.74 MIL/uL — ABNORMAL LOW (ref 3.87–5.11)
RDW: 14.8 % (ref 11.5–15.5)
WBC: 4 10*3/uL (ref 4.0–10.5)
nRBC: 0 % (ref 0.0–0.2)

## 2023-04-10 LAB — GLUCOSE, CAPILLARY
Glucose-Capillary: 111 mg/dL — ABNORMAL HIGH (ref 70–99)
Glucose-Capillary: 96 mg/dL (ref 70–99)
Glucose-Capillary: 148 mg/dL — ABNORMAL HIGH (ref 70–99)
Glucose-Capillary: 96 mg/dL (ref 70–99)
Glucose-Capillary: 76 mg/dL (ref 70–99)

## 2023-04-10 MED ORDER — VITAMIN B-12 1000 MCG PO TABS
2000.0000 ug | ORAL_TABLET | Freq: Every day | ORAL | Status: DC
  Administered 2023-04-10 – 2023-04-11 (×2): 2000 ug via ORAL
  Filled 2023-04-10 (×2): qty 2

## 2023-04-10 MED ORDER — SIMVASTATIN 20 MG PO TABS
20.0000 mg | ORAL_TABLET | Freq: Every day | ORAL | Status: DC
  Administered 2023-04-10: 20 mg via ORAL
  Filled 2023-04-10: qty 1

## 2023-04-10 MED ORDER — MAGNESIUM OXIDE -MG SUPPLEMENT 400 (240 MG) MG PO TABS
400.0000 mg | ORAL_TABLET | Freq: Every day | ORAL | Status: DC
  Administered 2023-04-10 – 2023-04-11 (×2): 400 mg via ORAL
  Filled 2023-04-10 (×2): qty 1

## 2023-04-10 MED ORDER — VITAMIN D 25 MCG (1000 UNIT) PO TABS
2000.0000 [IU] | ORAL_TABLET | Freq: Every day | ORAL | Status: DC
  Administered 2023-04-10 – 2023-04-11 (×2): 2000 [IU] via ORAL
  Filled 2023-04-10 (×2): qty 2

## 2023-04-10 MED ORDER — OYSTER SHELL CALCIUM/D3 500-5 MG-MCG PO TABS
1.0000 | ORAL_TABLET | Freq: Every day | ORAL | Status: DC
  Administered 2023-04-10 – 2023-04-11 (×2): 1 via ORAL
  Filled 2023-04-10 (×2): qty 1

## 2023-04-10 MED ORDER — LATANOPROST 0.005 % OP SOLN
1.0000 [drp] | Freq: Every day | OPHTHALMIC | Status: DC
  Administered 2023-04-10: 1 [drp] via OPHTHALMIC
  Filled 2023-04-10: qty 2.5

## 2023-04-10 MED ORDER — INSULIN GLARGINE-YFGN 100 UNIT/ML ~~LOC~~ SOLN
10.0000 [IU] | Freq: Every day | SUBCUTANEOUS | Status: DC
Start: 2023-04-10 — End: 2023-04-10

## 2023-04-10 MED ORDER — NIACIN ER (ANTIHYPERLIPIDEMIC) 500 MG PO TBCR
500.0000 mg | EXTENDED_RELEASE_TABLET | Freq: Every day | ORAL | Status: DC
  Administered 2023-04-10: 500 mg via ORAL
  Filled 2023-04-10: qty 1

## 2023-04-10 MED ORDER — SODIUM CHLORIDE 0.9 % IV SOLN: Freq: Once | INTRAVENOUS | Status: AC

## 2023-04-10 MED ORDER — MORPHINE SULFATE (PF) 2 MG/ML IV SOLN: 1.0000 mg | INTRAVENOUS | Status: DC | PRN

## 2023-04-10 NOTE — Progress Notes (Signed)
[facility]  - [place] at [facility]   PATIENT NAME: Emily Faulkner    MR#:  [id number]  DATE OF BIRTH:  [birth year]  SUBJECTIVE:  no family at bedside. Patient seen earlier. She was on the commode in the bathroom. Awaiting TLSO has  been helping her. Her pain she rates 4/10.    VITALS:  Blood pressure (!) 104/43, pulse 77, temperature 97.6 F (36.4 C), resp. rate 18, height 5\' 5"  (1.651 m), weight 72.6 kg, SpO2 98%.  PHYSICAL EXAMINATION:   GENERAL:  82 y.o.-year-old patient with no acute distress.  LUNGS: Normal breath sounds bilaterally, no wheezing CARDIOVASCULAR: S1, S2 normal. No murmur   ABDOMEN: Soft, non-tender. TLSO brace + EXTREMITIES: No  edema b/l.    NEUROLOGIC: nonfocal  patient is alert and awake  LABORATORY PANEL:  CBC Recent Labs  Lab 04/10/23 0531  WBC 4.0  HGB 9.0*  HCT 27.5*  PLT 88*    Chemistries  Recent Labs  Lab 04/09/23 0705 04/10/23 0531  NA 136 139  K 4.5 3.9  CL 109 110  CO2 20* 24  GLUCOSE 173* 76  BUN 29* 30*  CREATININE 2.28* 2.04*  CALCIUM 8.4* 8.5*  AST 37  --   ALT 17  --   ALKPHOS 63  --   BILITOT 0.9  --    Assessment and Plan  82 y.o. female with medical history significant of multiple medical use including CKD, complete heart block status post permanent pacemaker, osteoporosis, hypertension, hyperlipidemia, renal cancer status post ablation, presenting with intractable back pain. CT of the T and L-spine with noted new left breast 3.8 x 3 point centimeter mass concerning for breast malignancy, T3 superior endplate fracture, new T10 superior endplate fracture   Intractable back pain --Acute onset of intractable mid and low back pain x 2 days -CT imaging with noted with noted new wedge deformities at T3 and T10 consistent with pain distribution --Noted prior T12 and L4 kyphoplastys  --Neurosurgery recommends TLSO brace --PT OT evaluation - recommends HHPT, OT --Continue Pain control - she has morphine IV  ordered and encouraged to use if need   Stage 3b chronic kidney disease (HCC) --Baseline creatinine 1.2-1.5 with GFR in the 30s to 40s --Creatinine worsening Recent Labs       Lab Results  Component Value Date    CREATININE 2.28 (H) 04/09/2023    CREATININE 1.74 (H) 04/08/2023    CREATININE 1.50 (H) 09/01/2022    --Continue IV fluid hydration --Avoid nephrotoxic agents --creat trending down to 2.06   New Breast mass (HCC) --CT imaging with noted Spiculated soft tissue mass of the lower outer left breast, 3.8 x 3.3 cm, highly concerning for primary breast malignancy --Follows Dr. Smith Robert outpt for iron deficiency anemia  --per Dr Sherryll Burger-- notified Dr. Smith Robert as an FYI at the request of the family - she will see her at the cancer center for further eval and treatment --Cancer center will set office appt at [place]    Symptomatic bradycardia --Followed by Dr. Darrold Junker -Status post permanent pacemaker   Age-related osteoporosis with current pathological fracture --Followed by [facility]ocrinology at the [facility] system --On Fosamax, calcium, vitamin D --Noted vertebral fractures with prior kyphoplasty   Renal mass --Status post ablative therapy March 2024   Hypertension --BP soft/low. --No need of meds    Type 2 diabetes mellitus (HCC) --Continue SSI, Long acting insulin and novolog TID with meals   Acute Hypoxic Respi failure --85% on RA and 92%  on 2 liters. --Likely due to pulmonary fibrosis. UIP pattern seen on CT  --wean to RA if able to   DVT prophylaxis:  enoxaparin (LOVENOX) injection 30 mg Start: 04/08/23 1630 Place TED hose Start: 04/08/23 1533   Family communication :Son, Kandis Cocking Consults :Neurosurgery CODE STATUS: full Level of care: Telemetry Medical Status is: Inpatient Remains inpatient appropriate because: continue IV fluids for one more day.    TOTAL TIME TAKING CARE OF THIS PATIENT: 35 minutes.  >50% time spent on counselling and coordination of care  Note: This  dictation was prepared with Dragon dictation along with smaller phrase technology. Any transcriptional errors that result from this process are unintentional.  Enedina Finner M.D    [facility]   CC: Primary care physician; Marina Goodell, MD

## 2023-04-10 NOTE — Evaluation (Signed)
Occupational Therapy Evaluation Patient Details Name: Emily Faulkner MRN: [id number] DOB: 04-20-1941 Today's Date: 04/10/2023   History of Present Illness Pt is an 82 year old female presenting to the ED with sudden onset of mid and low back pain, CT imaging with noted with noted new wedge deformities at T3 and T10 consistent with pain distribution  Noted prior T12 and L4 kyphoplastys   Neurosurgery preliminary consulted with plan for TLSO brace, noted Spiculated soft tissue mass of the lower outer left breast, 3.8 x 3.3 cm    PMH significant for  CKD, complete heart block status post permanent pacemaker, osteoporosis, hypertension, hyperlipidemia, renal cancer status post ablation   Clinical Impression   Pt seen for OT treatment on this date. Upon arrival to room pt seated EOB, attempted to get assistance for getting to the bathroom. Treatment session targeted improved ADL activity tolerance. Pt was oriented and alert during session and eager for assistance. Bed mobility; STS Pt required MIN A + RW. Mobility during session RW + CGA for stability. Pt amb with RW + CGA to bathroom. Pt requires MIN A for peri care, she is able to manage her brief and gown during toileting. Pt spo2 was monitored during session, Pt spo2 >92% on 3 L via  post mobility. Pt making good progress toward goals, will continue to follow POC. Discharge recommendation remains appropriate. OT will follow acutely.       If plan is discharge home, recommend the following: A little help with walking and/or transfers;A little help with bathing/dressing/bathroom;Assistance with cooking/housework;Assist for transportation;Help with stairs or ramp for entrance    Functional Status Assessment  Patient has had a recent decline in their functional status and demonstrates the ability to make significant improvements in function in a reasonable and predictable amount of time.  Equipment Recommendations  Other (comment) (AD for peri care)     Recommendations for Other Services       Precautions / Restrictions Precautions Precautions: Fall Required Braces or Orthoses: Other Brace Spinal Brace: Thoracolumbosacral orthotic Restrictions Weight Bearing Restrictions: No      Mobility Bed Mobility Overal bed mobility: Needs Assistance Bed Mobility: Sit to Sidelying, Rolling Rolling: Min assist (verbal cues for log rolling steps)       Sit to sidelying: Min assist, Used rails, HOB elevated (Assist needed to LB movement in bed) General bed mobility comments: Verbal cue needed for log rolling sequence    Transfers Overall transfer level: Needs assistance Equipment used: Rolling walker (2 wheels) Transfers: Sit to/from Stand Sit to Stand: Contact guard assist                  Balance Overall balance assessment: Needs assistance Sitting-balance support: Feet supported Sitting balance-Leahy Scale: Good     Standing balance support: Bilateral upper extremity supported Standing balance-Leahy Scale: Poor                             ADL either performed or assessed with clinical judgement   ADL Overall ADL's : Needs assistance/impaired Eating/Feeding: Set up;Sitting Eating/Feeding Details (indicate cue type and reason): Drinking with straw                     Toilet Transfer: Contact guard assist;Rolling walker (2 wheels);Regular Toilet;Grab bars   Toileting- Clothing Manipulation and Hygiene: Minimal assistance;Sitting/lateral lean Toileting - Clothing Manipulation Details (indicate cue type and reason): Assist needed for completion with peri  care     Functional mobility during ADLs: Contact guard assist;Rolling walker (2 wheels) General ADL Comments: Pt stated she called for help to assist her bathroom, upon entry to room Pt was on EOB with urgent need to use bathroom.     Vision Patient Visual Report: No change from baseline       Perception         Praxis          Pertinent Vitals/Pain Pain Assessment Pain Assessment: Faces Faces Pain Scale: Hurts little more Pain Location: Back pain Pain Descriptors / Indicators: Discomfort, Grimacing, Guarding (Pt stated she has the most pain when getting in bed) Pain Intervention(s): Limited activity within patient's tolerance, Repositioned, Patient requesting pain meds-RN notified, Monitored during session     Extremity/Trunk Assessment Upper Extremity Assessment Upper Extremity Assessment: Generalized weakness   Lower Extremity Assessment Lower Extremity Assessment: Generalized weakness       Communication Communication Communication: No apparent difficulties Cueing Techniques: Verbal cues;Visual cues   Cognition Arousal: Alert Behavior During Therapy: WFL for tasks assessed/performed Overall Cognitive Status: Within Functional Limits for tasks assessed                                 General Comments: Pt AO during session     General Comments  Pt spo2 >92% on 3 L via [place] post mobility    Exercises Other Exercises Other Exercises: Edu: Log rolling teqnique, energy conservation during ADLs, body mechanics with peri care    Shoulder Instructions      Home Living                                          Prior Functioning/Environment                          OT Problem List: Decreased strength;Decreased activity tolerance;Impaired balance (sitting and/or standing);Decreased knowledge of use of DME or AE;Decreased knowledge of precautions      OT Treatment/Interventions: Self-care/ADL training;Balance training;Therapeutic exercise;Therapeutic activities;DME and/or AE instruction;Patient/family education    OT Goals(Current goals can be found in the care plan section) Acute Rehab OT Goals Patient Stated Goal: go home OT Goal Formulation: With patient Time For Goal Achievement: 04/23/23 Potential to Achieve Goals: Good  OT Frequency: Min 1X/week     Co-evaluation              AM-PAC OT "6 Clicks" Daily Activity     Outcome Measure Help from another person eating meals?: None Help from another person taking care of personal grooming?: None Help from another person toileting, which includes using toliet, bedpan, or urinal?: A Little Help from another person bathing (including washing, rinsing, drying)?: A Little Help from another person to put on and taking off regular upper body clothing?: None Help from another person to put on and taking off regular lower body clothing?: A Little 6 Click Score: 21   End of Session Equipment Utilized During Treatment: Rolling walker (2 wheels);Oxygen Nurse Communication: Patient requests pain meds  Activity Tolerance: Patient tolerated treatment well Patient left: in chair;with call bell/phone within reach;with chair alarm set;with family/visitor present  OT Visit Diagnosis: Other abnormalities of gait and mobility (R26.89);Muscle weakness (generalized) (M62.81)  Time: 8413-2440 OT Time Calculation (min): 20 min Charges:  OT General Charges $OT Visit: 1 Visit OT Treatments $Self Care/Home Management : 8-22 mins  Glenard Haring, OTS

## 2023-04-10 NOTE — Plan of Care (Signed)

## 2023-04-10 NOTE — Plan of Care (Signed)

## 2023-04-10 NOTE — Consult Note (Signed)
Consultation Note Date: 04/10/2023   Patient Name: Emily Faulkner  DOB: 05/20/1941  MRN: [id number]  Age / Sex: 82 y.o., female  PCP: Marina Goodell, MD Referring Physician: Enedina Finner, MD  Reason for Consultation: Establishing goals of care  HPI/Patient Profile: Emily Faulkner is a 82 y.o. female with medical history significant of multiple medical use including CKD, complete heart block status post permanent pacemaker, osteoporosis, hypertension, hyperlipidemia, renal cancer status post ablation, presenting with intractable back pain.  Per the family, patient was ambulating and had sudden onset of mid and low back pain.  Noted baseline history of vertebral compression fracture status post kyphoplasty.  No reported falls or trauma.  Has had difficulty with ambulation since this point.  No fevers or chills.  No nausea or vomiting.  No reported chest pain or shortness of breath.   Clinical Assessment and Goals of Care: Notes and labs reviewed.  In to see patient.  She is currently resting in bed at this time.  She advises that she lives alone at baseline.  She states she has 3 sons that come to see her.  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  She states she is widowed x 2.  Functionally, patient states she uses a cane for mobility and has a lift chair.  She states occasionally she uses a walker.  Currently has a TLSO brace in place and states that she has mild pain.  She discusses previous kyphoplasty's.  Patient had visitors arrive at bedside which she requested to speak with.  PMT will follow-up at a later time.  SUMMARY OF RECOMMENDATIONS    PMT will follow       Primary Diagnoses: Present on Admission:  Intractable back pain  Symptomatic bradycardia  Stage 3b chronic kidney disease (HCC)  Renal mass  Hypertension   Age-related osteoporosis without current pathological fracture   I have reviewed the medical record, interviewed the patient and family, and examined the patient. The following aspects are pertinent.  Past Medical History:  Diagnosis Date   Acute kidney failure (HCC) 11/16/2020   AKI (acute kidney injury) (HCC) 03/17/2018   Anemia    Cancer (HCC)    breast cancer right mastectomy   Diabetes mellitus without complication (HCC)    GIB (gastrointestinal bleeding) 02/20/2018   Glaucoma    Hyperkalemia 10/24/2020   Hyperlipidemia    Hypertension    Incontinence of bowel    Neuropathy    Odynophagia 10/24/2020   Osteoarthritis    Presence of permanent cardiac pacemaker    Rheumatoid arthritis (HCC)    Syncope and collapse 10/24/2020   Telangiectasias    Social History   Socioeconomic History   Marital status: Widowed    Spouse name: Not on file   Number of children: Not on file   Years of education: Not on file   Highest education level: Not on file  Occupational History   Not on file  Tobacco Use   Smoking status: Former    Current packs/day: 0.00    Types: Cigarettes    Quit date: 07/10/2011    Years since quitting: 11.7   Smokeless tobacco: Never  Vaping Use   Vaping status: Never Used  Substance and Sexual Activity   Alcohol use: No   Drug use: No   Sexual activity: Not Currently  Other Topics Concern   Not on file  Social History Narrative   Not on file   Social Determinants of Health   Financial Resource Strain: Low Risk  (03/27/2023)   Received from [facility]   Overall Financial Resource Strain (CARDIA)    Difficulty of Paying Living Expenses: Not hard at all  Food Insecurity: No Food Insecurity (04/08/2023)   Hunger Vital Sign    Worried About Running Out of Food in the Last Year: Never true    Ran Out of Food in the Last Year: Never true  Transportation Needs: No Transportation Needs (04/08/2023)   PRAPARE - Doctor, general practice (Medical): No    Lack of Transportation (Non-Medical): No  Physical Activity: Not on file  Stress: Not on file  Social Connections: Not on file   Family History  Problem Relation Age of Onset   Kidney cancer Son    Bladder Cancer Neg Hx    Scheduled Meds:  budesonide (PULMICORT) nebulizer solution  0.5 mg Nebulization BID   calcium-vitamin D  1 tablet Oral Daily   cholecalciferol  2,000 Units Oral Daily   cyanocobalamin  2,000 mcg Oral Daily   enoxaparin (LOVENOX) injection  30 mg Subcutaneous Q24H   insulin aspart  0-9 Units Subcutaneous TID WC   insulin aspart  3 Units Subcutaneous TID WC   insulin detemir  15 Units Subcutaneous QHS   latanoprost  1 drop Both Eyes QHS   magnesium oxide  400 mg Oral Daily   niacin  500 mg Oral QHS   polyethylene glycol  17 g Oral Daily   senna-docusate  2 tablet Oral BID   simvastatin  20 mg Oral QHS   Continuous Infusions: PRN Meds:.HYDROcodone-acetaminophen, ipratropium-albuterol, morphine injection, ondansetron **OR** ondansetron (ZOFRAN) IV Medications Prior to Admission:  Prior to Admission medications   Medication Sig Start Date End Date Taking? Authorizing Provider  ACCU-CHEK AVIVA PLUS test strip  03/28/18  Yes [provider]  ACCU-CHEK SOFTCLIX LANCETS lancets once daily Use as instructed. 04/14/18  Yes [provider]  acetaminophen (TYLENOL) 500 MG tablet Take 500 mg by mouth every 8 (eight) hours as needed for moderate pain or mild pain.   Yes [provider]  alendronate (FOSAMAX) 70 MG tablet TAKE 1 TABLET BY MOUTH EVERY 7DAYS WITH A FULL GLASS OF WATER. DO NOT LIE DOWN FOR 30 MINUTES 03/21/21  Yes [provider]  Biotin 1000 MCG tablet Take 1,000 mcg by mouth daily.   Yes [provider]  Calcium Carb-Cholecalciferol (OYSTER SHELL CALCIUM W/D) 500-5 MG-MCG TABS Take 1 tablet by mouth daily.   Yes [provider]  Cholecalciferol 50 MCG (2000 UT) CAPS Take  2,000 Units by mouth daily.   Yes [provider]  feeding supplement, ENSURE ENLIVE, (ENSURE ENLIVE) LIQD Take 237 mLs by mouth 2 (two) times daily between meals. 02/26/18  Yes Enedina Finner, MD  insulin glargine (LANTUS) 100 UNIT/ML Solostar Pen Inject 26 Units into the skin daily. (may take up to 50u based upon blood glucose reading)   Yes  [provider]  Insulin Pen Needle 32G X 4 MM MISC USE 1 NEEDLE SUBCUTANEOUSLY ONCE A DAY AS DIRECTED 03/24/18  Yes [provider]  losartan (COZAAR) 50 MG tablet Take 25 mg by mouth daily. 07/28/22  Yes [provider]  Magnesium 100 MG CAPS Take 2 capsules by mouth daily. Glycinate   Yes [provider]  metFORMIN (GLUCOPHAGE-XR) 500 MG 24 hr tablet Take 500 mg by mouth daily with breakfast. 09/18/19  Yes [provider]  niacin (VITAMIN B3) 500 MG ER tablet Take 500 mg by mouth at bedtime.   Yes [provider]  niacin 500 MG CR capsule Take 500 mg by mouth at bedtime.   Yes [provider]  octreotide (SANDOSTATIN LAR) 10 MG injection Inject 10 mg into the muscle every 28 (twenty-eight) days. Inject 10 mg into the muscle every 28 (twenty-eight) days   Yes [provider]  omeprazole (PRILOSEC) 40 MG capsule TAKE 1 CAPSULE BY MOUTH EVERY DAY BEFORE BREAKFAST 02/27/23  Yes Vanga, Loel Dubonnet, MD  Probiotic Product (PROBIOTIC PO) Take 1 tablet by mouth daily. 2 billion   Yes [provider]  SANDOSTATIN LAR DEPOT 20 MG injection Inject 20 mg into the muscle every 28 (twenty-eight) days. 01/22/23  Yes [provider]  simvastatin (ZOCOR) 20 MG tablet Take 20 mg by mouth at bedtime. 12/23/17  Yes [provider]  sitaGLIPtin (JANUVIA) 50 MG tablet Take 50 mg by mouth daily.   Yes [provider]  TRAVATAN Z 0.004 % SOLN ophthalmic solution Place 1 drop into both eyes at bedtime. 12/05/17  Yes [provider]  TURMERIC PO Take 538 mg by mouth  daily.   Yes [provider]  vitamin B-12 (CYANOCOBALAMIN) 1000 MCG tablet Take 2,000 mcg by mouth daily.   Yes [provider]   No Known Allergies Review of Systems  All other systems reviewed and are negative.   Physical Exam Pulmonary:     Effort: Pulmonary effort is normal.  Musculoskeletal:     Comments: TLSO brace in place  Neurological:     Mental Status: She is alert.     Vital Signs: BP (!) 115/50 (BP Location: Left Arm)   Pulse 79   Temp 98.3 F (36.8 C)   Resp 14   Ht 5\' 5"  (1.651 m)   Wt 72.6 kg   SpO2 97%   BMI 26.63 kg/m  Pain Scale: 0-10 POSS *See Group Information*: 1-Acceptable,Awake and alert Pain Score: 3    SpO2: SpO2: 97 % O2 Device:SpO2: 97 % O2 Flow Rate: .O2 Flow Rate (L/min): 3 L/min  IO: Intake/output summary:  Intake/Output Summary (Last 24 hours) at 04/10/2023 1629 Last data filed at 04/10/2023 2130 Gross per 24 hour  Intake 2093.38 ml  Output --  Net 2093.38 ml    LBM: Last BM Date : 04/06/23 Baseline Weight: Weight: 72.6 kg Most recent weight: Weight: 72.6 kg       Signed by: Morton Stall, NP   Please contact Palliative Medicine Team phone at [phone] for questions and concerns.  For individual provider: See Loretha Stapler

## 2023-04-11 ENCOUNTER — Encounter: Payer: Self-pay | Admitting: *Deleted

## 2023-04-11 DIAGNOSIS — N6323 Unspecified lump in the left breast, lower outer quadrant: Secondary | ICD-10-CM

## 2023-04-11 DIAGNOSIS — M549 Dorsalgia, unspecified: Secondary | ICD-10-CM | POA: Diagnosis not present

## 2023-04-11 LAB — CREATININE, SERUM
Creatinine, Ser: 1.51 mg/dL — ABNORMAL HIGH (ref 0.44–1.00)
GFR, Estimated: 34 mL/min — ABNORMAL LOW (ref >60)

## 2023-04-11 LAB — GLUCOSE, CAPILLARY
Glucose-Capillary: 114 mg/dL — ABNORMAL HIGH (ref 70–99)
Glucose-Capillary: 127 mg/dL — ABNORMAL HIGH (ref 70–99)

## 2023-04-11 MED ORDER — INSULIN GLARGINE 100 UNIT/ML SOLOSTAR PEN: 15.0000 [IU] | PEN_INJECTOR | Freq: Every day | SUBCUTANEOUS | 11 refills | Status: DC

## 2023-04-11 MED ORDER — HYDROCODONE-ACETAMINOPHEN 5-325 MG PO TABS: 1.0000 | ORAL_TABLET | Freq: Three times a day (TID) | ORAL | 0 refills | Status: DC | PRN

## 2023-04-11 NOTE — TOC Transition Note (Signed)
Transition of Care [facility]) - CM/SW Discharge Note   Patient Details  Name: Emily Faulkner MRN: [id number] Date of Birth: Mar 13, 1941  [facility] CM/SW Contact:  Marlowe Sax, RN Phone Number: 04/11/2023, 10:06 AM   Clinical Narrative:    The patient is doping well and will dc home on room air [facility] will see for [facility] and she has no DME needs, family to transport   Final next level of care: Home w Home Health Services Barriers to Discharge: Continued Medical Work up   Patient Goals and CMS Choice      Discharge Placement                         Discharge Plan and Services Additional resources added to the After Visit Summary for     Discharge Planning Services: CM Consult                      HH Arranged: PT, OT HH Agency: [facility] Date [facility] Agency Contacted: 04/09/23 Time HH Agency Contacted: 1510 Representative spoke with at [facility] Agency: Kandee Keen  Social Determinants of Health (SDOH) Interventions SDOH Screenings   Food Insecurity: No Food Insecurity (04/08/2023)  Housing: Low Risk  (04/08/2023)  Transportation Needs: No Transportation Needs (04/08/2023)  Utilities: Not At Risk (04/08/2023)  Depression (PHQ2-9): Low Risk  (02/05/2022)  Financial Resource Strain: Low Risk  (03/27/2023)   Received from [facility]  Tobacco Use: Medium Risk (04/08/2023)     Readmission Risk Interventions    10/28/2020    4:00 PM  Readmission Risk Prevention Plan  Transportation Screening Complete  PCP or Specialist Appt within 3-5 Days Complete  HRI or Home Care Consult Complete  Social Work Consult for Recovery Care Planning/Counseling Complete  Palliative Care Screening Not Applicable  Medication Review [employer]) Complete

## 2023-04-11 NOTE — Plan of Care (Signed)

## 2023-04-11 NOTE — Telephone Encounter (Signed)
10/24 with Duwayne Heck

## 2023-04-11 NOTE — Progress Notes (Signed)
Breast mass found on CT while in the hospital.  Orders for diagnostic mammogram and biopsy entered per Dr. Smith Robert.   I spoke with Asher Muir at [facility] breast center and she said someone will call Ms. Ferd Glassing with the appointments.

## 2023-04-11 NOTE — Plan of Care (Addendum)
PMT following. Spoke with staff and patient is actively discharging home at this time. PMT will sign off at this time. Please reconsult if needs arise.

## 2023-04-11 NOTE — Discharge Summary (Signed)
Physician Discharge Summary   Patient: Emily Faulkner MRN: [id number] DOB: Dec 30, 1940  Admit date:     04/08/2023  Discharge date: 04/11/23  Discharge Physician: Enedina Finner   PCP: Marina Goodell, MD   Recommendations at discharge:      Discharge Diagnoses: Principal Problem:   Intractable back pain Active Problems:   Stage 3b chronic kidney disease (HCC)   Hypertension   Renal mass   Age-related osteoporosis without current pathological fracture   Symptomatic bradycardia   Breast cancer (HCC)   Closed wedge compression fracture of T10 vertebra (HCC)   Mass of lower outer quadrant of left breast  82 y.o. female with medical history significant of multiple medical use including CKD, complete heart block status post permanent pacemaker, osteoporosis, hypertension, hyperlipidemia, renal cancer status post ablation, presenting with intractable back pain. CT of the T and L-spine with noted new left breast 3.8 x 3 point centimeter mass concerning for breast malignancy, T3 superior endplate fracture, new T10 superior endplate fracture    Intractable back pain --Acute onset of intractable mid and low back pain x 2 days -CT imaging with noted with noted new wedge deformities at T3 and T10 consistent with pain distribution --Noted prior T12 and L4 kyphoplastys  --Neurosurgery recommends TLSO brace --PT OT evaluation - recommends HHPT, OT --Continue Pain control prn   Stage 3b chronic kidney disease (HCC) --Baseline creatinine 1.2-1.5 with GFR in the 30s to 40s --Creatinine worsening --Continue IV fluid hydration --Avoid nephrotoxic agents --creat trending down to 2.06--1.5 today   New Breast mass (HCC) --CT imaging with noted Spiculated soft tissue mass of the lower outer left breast, 3.8 x 3.3 cm, highly concerning for primary breast malignancy --Follows Dr. Smith Robert outpt for iron deficiency anemia  --per Dr Sherryll Burger-- notified Dr. Smith Robert as an FYI at the request of the family - she will  see her at the cancer center for further eval and treatment --Cancer center will set office appt at [place]    Symptomatic bradycardia --Followed by Dr. Darrold Junker -Status post permanent pacemaker   Age-related osteoporosis with current pathological fracture --Followed by [facility]ocrinology at the [facility] --On Fosamax, calcium, vitamin D --Noted vertebral fractures with prior kyphoplasty   Renal mass --Status post ablative therapy March 2024   Hypertension --BP soft/low. --No need of meds    Type 2 diabetes mellitus (HCC) --Continue SSI, Long acting insulin and novolog TID with meals   Acute Hypoxic Respi failure --85% on RA and 92% on 2 liters. --Likely due to pulmonary fibrosis. UIP pattern seen on CT  --weaned to RA   DVT prophylaxis:  enoxaparin (LOVENOX) injection 30 mg Start: 04/08/23 1630 Place TED hose Start: 04/08/23 1533   Family communication : left message earlier for son, Kandis Cocking Consults :Neurosurgery CODE STATUS: full  Overall improving slowly. Will discharge to home with home health and outpatient follow-up with Dr. Smith Robert and neurosurgery Dr. Myer Haff    Pain control - [facility] Controlled Substance Reporting System database was reviewed. and patient was instructed, not to drive, operate heavy machinery, perform activities at heights, swimming or participation in water activities or provide baby-sitting services while on Pain, Sleep and Anxiety Medications; until their outpatient Physician has advised to do so again. Also recommended to not to take more than prescribed Pain, Sleep and Anxiety Medications.  Disposition: Home health Diet recommendation:  Discharge Diet Orders (From admission, onward)     Start     Ordered   04/11/23 0000  Diet - low sodium heart healthy        04/11/23 1029           Cardiac diet DISCHARGE MEDICATION: Allergies as of 04/11/2023   No Known Allergies      Medication List     STOP taking these medications     metFORMIN 500 MG 24 hr tablet Commonly known as: GLUCOPHAGE-XR       TAKE these medications    Accu-Chek Aviva Plus test strip Generic drug: glucose blood   Accu-Chek Softclix Lancets lancets once daily Use as instructed.   acetaminophen 500 MG tablet Commonly known as: TYLENOL Take 500 mg by mouth every 8 (eight) hours as needed for moderate pain or mild pain.   alendronate 70 MG tablet Commonly known as: FOSAMAX TAKE 1 TABLET BY MOUTH EVERY 7DAYS WITH A FULL GLASS OF WATER. DO NOT LIE DOWN FOR 30 MINUTES   Biotin 1000 MCG tablet Take 1,000 mcg by mouth daily.   Cholecalciferol 50 MCG (2000 UT) Caps Take 2,000 Units by mouth daily.   cyanocobalamin 1000 MCG tablet Commonly known as: VITAMIN B12 Take 2,000 mcg by mouth daily.   feeding supplement Liqd Take 237 mLs by mouth 2 (two) times daily between meals.   HYDROcodone-acetaminophen 5-325 MG tablet Commonly known as: NORCO/VICODIN Take 1 tablet by mouth every 8 (eight) hours as needed for severe pain or moderate pain.   insulin glargine 100 UNIT/ML Solostar Pen Commonly known as: LANTUS Inject 15 Units into the skin daily. (may take up to 50u based upon blood glucose reading) What changed: how much to take   Insulin Pen Needle 32G X 4 MM Misc USE 1 NEEDLE SUBCUTANEOUSLY ONCE A DAY AS DIRECTED   losartan 50 MG tablet Commonly known as: COZAAR Take 25 mg by mouth daily.   Magnesium 100 MG Caps Take 2 capsules by mouth daily. Glycinate   niacin 500 MG CR capsule Take 500 mg by mouth at bedtime.   niacin 500 MG ER tablet Commonly known as: VITAMIN B3 Take 500 mg by mouth at bedtime.   octreotide 10 MG injection Commonly known as: SANDOSTATIN LAR Inject 10 mg into the muscle every 28 (twenty-eight) days. Inject 10 mg into the muscle every 28 (twenty-eight) days   SandoSTATIN LAR [place] 20 MG injection Generic drug: octreotide Inject 20 mg into the muscle every 28 (twenty-eight) days.   omeprazole  40 MG capsule Commonly known as: PRILOSEC TAKE 1 CAPSULE BY MOUTH EVERY DAY BEFORE BREAKFAST   Oyster Shell Calcium w/D 500-5 MG-MCG Tabs Take 1 tablet by mouth daily.   PROBIOTIC PO Take 1 tablet by mouth daily. 2 billion   simvastatin 20 MG tablet Commonly known as: ZOCOR Take 20 mg by mouth at bedtime.   sitaGLIPtin 50 MG tablet Commonly known as: JANUVIA Take 50 mg by mouth daily.   Travatan Z 0.004 % Soln ophthalmic solution Generic drug: Travoprost (BAK Free) Place 1 drop into both eyes at bedtime.   TURMERIC PO Take 538 mg by mouth daily.        Follow-up Information     Feldpausch, Madaline Guthrie, MD. Schedule an appointment as soon as possible for a visit in 1 week(s).   Specialty: Family Medicine Contact information: [phone] [address] [facility] - PRIMARY CARE [place] [address] [phone]         Venetia Night, MD. Schedule an appointment as soon as possible for a visit in 1 week(s).   Specialty:  Neurosurgery Contact information: [address] [place] [address] [phone]                Discharge Exam: Ceasar Mons Weights   04/08/23 0544  Weight: 72.6 kg   GENERAL:  82 y.o.-year-old patient with no acute distress.  LUNGS: Normal breath sounds bilaterally, no wheezing CARDIOVASCULAR: S1, S2 normal. No murmur   ABDOMEN: Soft, non-tender. TLSO brace + EXTREMITIES: No  edema b/l.    NEUROLOGIC: nonfocal  patient is alert and awake  Condition at discharge: fair  The results of significant diagnostics from this hospitalization (including imaging, microbiology, ancillary and laboratory) are listed below for reference.   Imaging Studies: CT L-SPINE NO CHARGE  Result Date: 04/08/2023 CLINICAL DATA:  Abdominal and flank pain, stone suspected, radiating back pain, history of cryoablation for left renal cell carcinoma * Tracking Code: BO * EXAM: CT CHEST, ABDOMEN AND PELVIS WITHOUT CONTRAST CT THORACIC AND  LUMBAR SPINE WITHOUT CONTRAST TECHNIQUE: Multidetector CT imaging of the chest, abdomen and pelvis was performed following the standard protocol without IV contrast. Multidetector CT imaging of the thoracic and lumbar spine was performed following the standard protocol without IV contrast. RADIATION DOSE REDUCTION: This exam was performed according to the departmental dose-optimization program which includes automated exposure control, adjustment of the mA and/or kV according to patient size and/or use of iterative reconstruction technique. COMPARISON:  None Available. FINDINGS: CT CHEST FINDINGS Cardiovascular: Left chest multi lead pacer. Aortic atherosclerosis. Three-vessel coronary artery calcifications. Normal heart size. No pericardial effusion. Mediastinum/Nodes: No enlarged mediastinal, hilar, or axillary lymph nodes. Thyroid gland, trachea, and esophagus demonstrate no significant findings. Lungs/Pleura: Mild pulmonary fibrosis in a pattern with apical to basal gradient, featuring irregular peripheral interstitial opacity, septal thickening, and ground-glass, with minimal traction bronchiectasis of the lung bases and minimal subpleural bronchiolectasis, without clear evidence of honeycombing. No pleural effusion or pneumothorax. Musculoskeletal: Spiculated soft tissue mass of the lower outer left breast, which appears to involve the underlying pectoral musculature, measuring 3.8 x 3.3 cm (series 2, image 33). Status post right mastectomy and axillary lymph node dissection. No suspicious osseous lesions identified. CT ABDOMEN PELVIS FINDINGS Hepatobiliary: Coarse, nodular cirrhotic morphology of the liver. No noncontrast evidence of focal liver lesion. Status post cholecystectomy. No biliary ductal dilatation. Pancreas: Unremarkable. No pancreatic ductal dilatation or surrounding inflammatory changes. Spleen: Normal in size without significant abnormality. Adrenals/Urinary Tract: Adrenal glands are  unremarkable. Redemonstrated lesion of the anterior midportion of the left kidney measuring 3.3 x 2.5 cm, previously 3.8 x 3.3 cm, with increased internal hypodensity and adjacent fat necrosis (series 2, image 68). The right kidney is normal in noncontrast appearance without renal calculi, obvious solid lesion, or hydronephrosis. Bladder is unremarkable. Stomach/Bowel: Stomach is within normal limits. Appendix not clearly visualized. No evidence of bowel wall thickening, distention, or inflammatory changes. Sigmoid diverticulosis. Vascular/Lymphatic: Severe aortic atherosclerosis. Recanalization of the umbilical vein as well as varices about the left upper quadrant. No enlarged abdominal or pelvic lymph nodes. Reproductive: Endometrial thickening or fluid within the endometrial cavity measuring up to 1.1 cm, abnormal in the late postmenopausal setting (series 6, image 72). Other: No abdominal wall hernia or abnormality. No ascites. Musculoskeletal: No acute osseous findings. CT THORACIC AND LUMBAR SPINE FINDINGS Alignment: Straightening of the normal thoracic kyphosis and normal lumbar lordosis. Mild, focal kyphosis at T12 secondary to wedge deformity. Vertebral bodies: Age-indeterminate, sclerotic appearing superior endplate wedge deformity T3 (series 6, image 70). New, acute appearing non depressed superior endplate  fracture of T10 (series 6, image 69). Unchanged wedge deformity of T12 status post vertebral cement augmentation. Unchanged superior endplate wedge deformity of L4, status post interval vertebral cement augmentation (series 6, image 73). Disc spaces: Focally mild disc degenerative disease throughout the thoracic spine. Focally severe disc degenerative disease of T11 through L1 and L2-L3 with otherwise mild disc degenerative disease of the lumbar spine. Mild-to-moderate facet degenerative change throughout the lower lumbar spine. Paraspinous soft tissues: Unremarkable. IMPRESSION: 1. Spiculated soft  tissue mass of the lower outer left breast, which appears to involve the underlying pectoral musculature, measuring 3.8 x 3.3 cm, highly concerning for primary breast malignancy. 2. Redemonstrated lesion of the anterior midportion of the left kidney, slightly diminished in size with increased internal hypodensity and adjacent fat necrosis, generally in keeping with evolving post ablation change. Assessment for residual or recurrent viable tissue is very limited on noncontrast examination. 3. Age-indeterminate, sclerotic appearing superior endplate wedge deformity of T3, not previously imaged. 4. New, acute appearing non depressed superior endplate fracture of T10. 5. Unchanged wedge deformity of T12 status post vertebral cement augmentation. 6. Unchanged superior endplate wedge deformity of L4, status post interval vertebral cement augmentation. 7. Mild pulmonary fibrosis in a pattern with apical to basal gradient, featuring irregular peripheral interstitial opacity, septal thickening, and ground-glass, with minimal traction bronchiectasis of the lung bases and minimal subpleural bronchiolectasis, without clear evidence of honeycombing. 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 8. Cirrhosis. No noncontrast evidence of focal liver lesion. Recanalization of the umbilical vein. 9. Endometrial thickening or fluid within the endometrial cavity measuring up to 1.1 cm, abnormal in the late postmenopausal setting. Recommend pelvic ultrasound on a nonemergent, outpatient basis for further evaluation when clinically appropriate. 10. Coronary artery disease. Aortic Atherosclerosis (ICD10-I70.0). Electronically Signed   By: Jearld Lesch M.D.   On: 04/08/2023 11:40   CT CHEST ABDOMEN PELVIS WO CONTRAST  Result Date: 04/08/2023 CLINICAL DATA:  Abdominal and flank pain, stone suspected, radiating back pain, history of cryoablation for left renal cell carcinoma * Tracking Code: BO * EXAM: CT CHEST,  ABDOMEN AND PELVIS WITHOUT CONTRAST CT THORACIC AND LUMBAR SPINE WITHOUT CONTRAST TECHNIQUE: Multidetector CT imaging of the chest, abdomen and pelvis was performed following the standard protocol without IV contrast. Multidetector CT imaging of the thoracic and lumbar spine was performed following the standard protocol without IV contrast. RADIATION DOSE REDUCTION: This exam was performed according to the departmental dose-optimization program which includes automated exposure control, adjustment of the mA and/or kV according to patient size and/or use of iterative reconstruction technique. COMPARISON:  None Available. FINDINGS: CT CHEST FINDINGS Cardiovascular: Left chest multi lead pacer. Aortic atherosclerosis. Three-vessel coronary artery calcifications. Normal heart size. No pericardial effusion. Mediastinum/Nodes: No enlarged mediastinal, hilar, or axillary lymph nodes. Thyroid gland, trachea, and esophagus demonstrate no significant findings. Lungs/Pleura: Mild pulmonary fibrosis in a pattern with apical to basal gradient, featuring irregular peripheral interstitial opacity, septal thickening, and ground-glass, with minimal traction bronchiectasis of the lung bases and minimal subpleural bronchiolectasis, without clear evidence of honeycombing. No pleural effusion or pneumothorax. Musculoskeletal: Spiculated soft tissue mass of the lower outer left breast, which appears to involve the underlying pectoral musculature, measuring 3.8 x 3.3 cm (series 2, image 33). Status post right mastectomy and axillary lymph node dissection. No suspicious osseous lesions identified. CT ABDOMEN PELVIS FINDINGS Hepatobiliary: Coarse, nodular cirrhotic morphology of the liver. No noncontrast evidence of focal liver lesion. Status post cholecystectomy. No biliary ductal dilatation. Pancreas:  Unremarkable. No pancreatic ductal dilatation or surrounding inflammatory changes. Spleen: Normal in size without significant abnormality.  Adrenals/Urinary Tract: Adrenal glands are unremarkable. Redemonstrated lesion of the anterior midportion of the left kidney measuring 3.3 x 2.5 cm, previously 3.8 x 3.3 cm, with increased internal hypodensity and adjacent fat necrosis (series 2, image 68). The right kidney is normal in noncontrast appearance without renal calculi, obvious solid lesion, or hydronephrosis. Bladder is unremarkable. Stomach/Bowel: Stomach is within normal limits. Appendix not clearly visualized. No evidence of bowel wall thickening, distention, or inflammatory changes. Sigmoid diverticulosis. Vascular/Lymphatic: Severe aortic atherosclerosis. Recanalization of the umbilical vein as well as varices about the left upper quadrant. No enlarged abdominal or pelvic lymph nodes. Reproductive: Endometrial thickening or fluid within the endometrial cavity measuring up to 1.1 cm, abnormal in the late postmenopausal setting (series 6, image 72). Other: No abdominal wall hernia or abnormality. No ascites. Musculoskeletal: No acute osseous findings. CT THORACIC AND LUMBAR SPINE FINDINGS Alignment: Straightening of the normal thoracic kyphosis and normal lumbar lordosis. Mild, focal kyphosis at T12 secondary to wedge deformity. Vertebral bodies: Age-indeterminate, sclerotic appearing superior endplate wedge deformity T3 (series 6, image 70). New, acute appearing non depressed superior endplate fracture of T10 (series 6, image 69). Unchanged wedge deformity of T12 status post vertebral cement augmentation. Unchanged superior endplate wedge deformity of L4, status post interval vertebral cement augmentation (series 6, image 73). Disc spaces: Focally mild disc degenerative disease throughout the thoracic spine. Focally severe disc degenerative disease of T11 through L1 and L2-L3 with otherwise mild disc degenerative disease of the lumbar spine. Mild-to-moderate facet degenerative change throughout the lower lumbar spine. Paraspinous soft tissues:  Unremarkable. IMPRESSION: 1. Spiculated soft tissue mass of the lower outer left breast, which appears to involve the underlying pectoral musculature, measuring 3.8 x 3.3 cm, highly concerning for primary breast malignancy. 2. Redemonstrated lesion of the anterior midportion of the left kidney, slightly diminished in size with increased internal hypodensity and adjacent fat necrosis, generally in keeping with evolving post ablation change. Assessment for residual or recurrent viable tissue is very limited on noncontrast examination. 3. Age-indeterminate, sclerotic appearing superior endplate wedge deformity of T3, not previously imaged. 4. New, acute appearing non depressed superior endplate fracture of T10. 5. Unchanged wedge deformity of T12 status post vertebral cement augmentation. 6. Unchanged superior endplate wedge deformity of L4, status post interval vertebral cement augmentation. 7. Mild pulmonary fibrosis in a pattern with apical to basal gradient, featuring irregular peripheral interstitial opacity, septal thickening, and ground-glass, with minimal traction bronchiectasis of the lung bases and minimal subpleural bronchiolectasis, without clear evidence of honeycombing. 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 8. Cirrhosis. No noncontrast evidence of focal liver lesion. Recanalization of the umbilical vein. 9. Endometrial thickening or fluid within the endometrial cavity measuring up to 1.1 cm, abnormal in the late postmenopausal setting. Recommend pelvic ultrasound on a nonemergent, outpatient basis for further evaluation when clinically appropriate. 10. Coronary artery disease. Aortic Atherosclerosis (ICD10-I70.0). Electronically Signed   By: Jearld Lesch M.D.   On: 04/08/2023 11:40   CT T-SPINE NO CHARGE  Result Date: 04/08/2023 CLINICAL DATA:  Abdominal and flank pain, stone suspected, radiating back pain, history of cryoablation for left renal cell carcinoma *  Tracking Code: BO * EXAM: CT CHEST, ABDOMEN AND PELVIS WITHOUT CONTRAST CT THORACIC AND LUMBAR SPINE WITHOUT CONTRAST TECHNIQUE: Multidetector CT imaging of the chest, abdomen and pelvis was performed following the standard protocol without IV contrast. Multidetector CT imaging of  the thoracic and lumbar spine was performed following the standard protocol without IV contrast. RADIATION DOSE REDUCTION: This exam was performed according to the departmental dose-optimization program which includes automated exposure control, adjustment of the mA and/or kV according to patient size and/or use of iterative reconstruction technique. COMPARISON:  None Available. FINDINGS: CT CHEST FINDINGS Cardiovascular: Left chest multi lead pacer. Aortic atherosclerosis. Three-vessel coronary artery calcifications. Normal heart size. No pericardial effusion. Mediastinum/Nodes: No enlarged mediastinal, hilar, or axillary lymph nodes. Thyroid gland, trachea, and esophagus demonstrate no significant findings. Lungs/Pleura: Mild pulmonary fibrosis in a pattern with apical to basal gradient, featuring irregular peripheral interstitial opacity, septal thickening, and ground-glass, with minimal traction bronchiectasis of the lung bases and minimal subpleural bronchiolectasis, without clear evidence of honeycombing. No pleural effusion or pneumothorax. Musculoskeletal: Spiculated soft tissue mass of the lower outer left breast, which appears to involve the underlying pectoral musculature, measuring 3.8 x 3.3 cm (series 2, image 33). Status post right mastectomy and axillary lymph node dissection. No suspicious osseous lesions identified. CT ABDOMEN PELVIS FINDINGS Hepatobiliary: Coarse, nodular cirrhotic morphology of the liver. No noncontrast evidence of focal liver lesion. Status post cholecystectomy. No biliary ductal dilatation. Pancreas: Unremarkable. No pancreatic ductal dilatation or surrounding inflammatory changes. Spleen: Normal in  size without significant abnormality. Adrenals/Urinary Tract: Adrenal glands are unremarkable. Redemonstrated lesion of the anterior midportion of the left kidney measuring 3.3 x 2.5 cm, previously 3.8 x 3.3 cm, with increased internal hypodensity and adjacent fat necrosis (series 2, image 68). The right kidney is normal in noncontrast appearance without renal calculi, obvious solid lesion, or hydronephrosis. Bladder is unremarkable. Stomach/Bowel: Stomach is within normal limits. Appendix not clearly visualized. No evidence of bowel wall thickening, distention, or inflammatory changes. Sigmoid diverticulosis. Vascular/Lymphatic: Severe aortic atherosclerosis. Recanalization of the umbilical vein as well as varices about the left upper quadrant. No enlarged abdominal or pelvic lymph nodes. Reproductive: Endometrial thickening or fluid within the endometrial cavity measuring up to 1.1 cm, abnormal in the late postmenopausal setting (series 6, image 72). Other: No abdominal wall hernia or abnormality. No ascites. Musculoskeletal: No acute osseous findings. CT THORACIC AND LUMBAR SPINE FINDINGS Alignment: Straightening of the normal thoracic kyphosis and normal lumbar lordosis. Mild, focal kyphosis at T12 secondary to wedge deformity. Vertebral bodies: Age-indeterminate, sclerotic appearing superior endplate wedge deformity T3 (series 6, image 70). New, acute appearing non depressed superior endplate fracture of T10 (series 6, image 69). Unchanged wedge deformity of T12 status post vertebral cement augmentation. Unchanged superior endplate wedge deformity of L4, status post interval vertebral cement augmentation (series 6, image 73). Disc spaces: Focally mild disc degenerative disease throughout the thoracic spine. Focally severe disc degenerative disease of T11 through L1 and L2-L3 with otherwise mild disc degenerative disease of the lumbar spine. Mild-to-moderate facet degenerative change throughout the lower lumbar  spine. Paraspinous soft tissues: Unremarkable. IMPRESSION: 1. Spiculated soft tissue mass of the lower outer left breast, which appears to involve the underlying pectoral musculature, measuring 3.8 x 3.3 cm, highly concerning for primary breast malignancy. 2. Redemonstrated lesion of the anterior midportion of the left kidney, slightly diminished in size with increased internal hypodensity and adjacent fat necrosis, generally in keeping with evolving post ablation change. Assessment for residual or recurrent viable tissue is very limited on noncontrast examination. 3. Age-indeterminate, sclerotic appearing superior endplate wedge deformity of T3, not previously imaged. 4. New, acute appearing non depressed superior endplate fracture of T10. 5. Unchanged wedge deformity of T12 status post vertebral cement augmentation.  6. Unchanged superior endplate wedge deformity of L4, status post interval vertebral cement augmentation. 7. Mild pulmonary fibrosis in a pattern with apical to basal gradient, featuring irregular peripheral interstitial opacity, septal thickening, and ground-glass, with minimal traction bronchiectasis of the lung bases and minimal subpleural bronchiolectasis, without clear evidence of honeycombing. 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 8. Cirrhosis. No noncontrast evidence of focal liver lesion. Recanalization of the umbilical vein. 9. Endometrial thickening or fluid within the endometrial cavity measuring up to 1.1 cm, abnormal in the late postmenopausal setting. Recommend pelvic ultrasound on a nonemergent, outpatient basis for further evaluation when clinically appropriate. 10. Coronary artery disease. Aortic Atherosclerosis (ICD10-I70.0). Electronically Signed   By: Jearld Lesch M.D.   On: 04/08/2023 11:40    Microbiology: Results for orders placed or performed during the hospital encounter of 10/24/20  Resp Panel by RT-PCR (Flu A&B, Covid) Nasopharyngeal Swab      Status: None   Collection Time: 10/24/20 11:24 AM   Specimen: Nasopharyngeal Swab; Nasopharyngeal(NP) swabs in vial transport medium  Result Value Ref Range Status   SARS Coronavirus 2 by RT PCR NEGATIVE NEGATIVE Final    Comment: (NOTE) SARS-CoV-2 target nucleic acids are NOT DETECTED.  The SARS-CoV-2 RNA is generally detectable in upper respiratory specimens during the acute phase of infection. The lowest concentration of SARS-CoV-2 viral copies this assay can detect is 138 copies/mL. A negative result does not preclude SARS-Cov-2 infection and should not be used as the sole basis for treatment or other patient management decisions. A negative result may occur with  improper specimen collection/handling, submission of specimen other than nasopharyngeal swab, presence of viral mutation(s) within the areas targeted by this assay, and inadequate number of viral copies(<138 copies/mL). A negative result must be combined with clinical observations, patient history, and epidemiological information. The expected result is Negative.  Fact Sheet for Patients:  BloggerCourse.com  Fact Sheet for Healthcare Providers:  SeriousBroker.it  This test is no t yet approved or cleared by the [place] FDA and  has been authorized for detection and/or diagnosis of SARS-CoV-2 by FDA under an Emergency Use Authorization (EUA). This EUA will remain  in effect (meaning this test can be used) for the duration of the COVID-19 declaration under Section 564(b)(1) of the Act, 21 U.S.C.section 360bbb-3(b)(1), unless the authorization is terminated  or revoked sooner.       Influenza A by PCR NEGATIVE NEGATIVE Final   Influenza B by PCR NEGATIVE NEGATIVE Final    Comment: (NOTE) The Xpert Xpress SARS-CoV-2/FLU/RSV plus assay is intended as an aid in the diagnosis of influenza from Nasopharyngeal swab specimens and should not be used as a sole basis  for treatment. Nasal washings and aspirates are unacceptable for Xpert Xpress SARS-CoV-2/FLU/RSV testing.  Fact Sheet for Patients: BloggerCourse.com  Fact Sheet for Healthcare Providers: SeriousBroker.it  This test is not yet approved or cleared by the [place] FDA and has been authorized for detection and/or diagnosis of SARS-CoV-2 by FDA under an Emergency Use Authorization (EUA). This EUA will remain in effect (meaning this test can be used) for the duration of the COVID-19 declaration under Section 564(b)(1) of the Act, 21 U.S.C. section 360bbb-3(b)(1), unless the authorization is terminated or revoked.  Performed at [facility], [address] [place], [place] [address]   C Difficile Quick Screen w PCR reflex     Status: Abnormal   Collection Time: 10/24/20  4:45 PM   Specimen: STOOL  Result Value  Ref Range Status   C Diff antigen POSITIVE (A) NEGATIVE Final   C Diff toxin NEGATIVE NEGATIVE Final   C Diff interpretation Results are indeterminate. See PCR results.  Final    Comment: Performed at [facility], [address] [place], [place] [address]  C. Diff by PCR, Reflexed     Status: Abnormal   Collection Time: 10/24/20  4:45 PM  Result Value Ref Range Status   Toxigenic C. Difficile by PCR POSITIVE (A) NEGATIVE Final    Comment: Positive for toxigenic C. difficile with little to no toxin production. Only treat if clinical presentation suggests symptomatic illness. Performed at [facility], [address] [place], [place] [address]   Surgical PCR screen     Status: Abnormal   Collection Time: 10/26/20  9:52 AM   Specimen: Nasal Mucosa; Nasal Swab  Result Value Ref Range Status   MRSA, PCR NEGATIVE NEGATIVE Final   Staphylococcus aureus POSITIVE (A) NEGATIVE Final    Comment: (NOTE) The Xpert SA Assay (FDA approved for NASAL specimens in patients 26 years of age and older), is  one component of a comprehensive surveillance program. It is not intended to diagnose infection nor to guide or monitor treatment. Performed at [facility], [address] [place], [place] [address]     Labs: CBC: Recent Labs  Lab 04/08/23 [phone] 04/09/23 0705 04/10/23 0531  WBC 5.5 3.7* 4.0  NEUTROABS 4.1  --   --   HGB 11.0* 10.3* 9.0*  HCT 33.8* 31.4* 27.5*  MCV 101.5* 100.0 100.4*  PLT 115* 86* 88*   Basic Metabolic Panel: Recent Labs  Lab 04/08/23 0916 04/09/23 0705 04/10/23 0531 04/11/23 0317  NA 141 136 139  --   K 4.0 4.5 3.9  --   CL 109 109 110  --   CO2 25 20* 24  --   GLUCOSE 178* 173* 76  --   BUN 25* 29* 30*  --   CREATININE 1.74* 2.28* 2.04* 1.51*  CALCIUM 9.0 8.4* 8.5*  --    Liver Function Tests: Recent Labs  Lab 04/09/23 0705  AST 37  ALT 17  ALKPHOS 63  BILITOT 0.9  PROT 5.8*  ALBUMIN 2.9*   CBG: Recent Labs  Lab 04/10/23 0847 04/10/23 1135 04/10/23 1634 04/10/23 2130 04/11/23 0749  GLUCAP 148* 96 114* 76 127*    Discharge time spent: greater than 30 minutes.  Signed: Enedina Finner, MD [facility] 04/11/2023

## 2023-04-12 ENCOUNTER — Inpatient Hospital Stay
Admission: RE | Admit: 2023-04-12 | Discharge: 2023-04-12 | Disposition: A | Discharge status: Home / Self Care | Payer: Self-pay | Source: Ambulatory Visit | Attending: Family Medicine | Admitting: Family Medicine

## 2023-04-12 ENCOUNTER — Other Ambulatory Visit: Payer: Self-pay | Admitting: Oncology

## 2023-04-12 ENCOUNTER — Other Ambulatory Visit: Payer: Self-pay | Admitting: *Deleted

## 2023-04-12 DIAGNOSIS — Z1231 Encounter for screening mammogram for malignant neoplasm of breast: Secondary | ICD-10-CM

## 2023-04-12 DIAGNOSIS — N6323 Unspecified lump in the left breast, lower outer quadrant: Secondary | ICD-10-CM

## 2023-04-15 DIAGNOSIS — Z794 Long term (current) use of insulin: Secondary | ICD-10-CM | POA: Diagnosis not present

## 2023-04-15 DIAGNOSIS — E1140 Type 2 diabetes mellitus with diabetic neuropathy, unspecified: Secondary | ICD-10-CM | POA: Diagnosis not present

## 2023-04-15 DIAGNOSIS — N6323 Unspecified lump in the left breast, lower outer quadrant: Secondary | ICD-10-CM | POA: Diagnosis not present

## 2023-04-15 DIAGNOSIS — K1121 Acute sialoadenitis: Secondary | ICD-10-CM | POA: Diagnosis not present

## 2023-04-15 DIAGNOSIS — Z23 Encounter for immunization: Secondary | ICD-10-CM | POA: Diagnosis not present

## 2023-04-15 DIAGNOSIS — M4854XD Collapsed vertebra, not elsewhere classified, thoracic region, subsequent encounter for fracture with routine healing: Secondary | ICD-10-CM | POA: Diagnosis not present

## 2023-04-16 DIAGNOSIS — I129 Hypertensive chronic kidney disease with stage 1 through stage 4 chronic kidney disease, or unspecified chronic kidney disease: Secondary | ICD-10-CM | POA: Diagnosis not present

## 2023-04-16 DIAGNOSIS — I442 Atrioventricular block, complete: Secondary | ICD-10-CM | POA: Diagnosis not present

## 2023-04-16 DIAGNOSIS — N1832 Chronic kidney disease, stage 3b: Secondary | ICD-10-CM | POA: Diagnosis not present

## 2023-04-16 DIAGNOSIS — D631 Anemia in chronic kidney disease: Secondary | ICD-10-CM | POA: Diagnosis not present

## 2023-04-16 DIAGNOSIS — E785 Hyperlipidemia, unspecified: Secondary | ICD-10-CM | POA: Diagnosis not present

## 2023-04-16 DIAGNOSIS — J9601 Acute respiratory failure with hypoxia: Secondary | ICD-10-CM | POA: Diagnosis not present

## 2023-04-16 DIAGNOSIS — D509 Iron deficiency anemia, unspecified: Secondary | ICD-10-CM | POA: Diagnosis not present

## 2023-04-16 DIAGNOSIS — M8008XD Age-related osteoporosis with current pathological fracture, vertebra(e), subsequent encounter for fracture with routine healing: Secondary | ICD-10-CM | POA: Diagnosis not present

## 2023-04-16 DIAGNOSIS — E1122 Type 2 diabetes mellitus with diabetic chronic kidney disease: Secondary | ICD-10-CM | POA: Diagnosis not present

## 2023-04-18 ENCOUNTER — Other Ambulatory Visit: Payer: Self-pay | Admitting: Interventional Radiology

## 2023-04-18 DIAGNOSIS — N2889 Other specified disorders of kidney and ureter: Secondary | ICD-10-CM

## 2023-04-18 DIAGNOSIS — R161 Splenomegaly, not elsewhere classified: Secondary | ICD-10-CM

## 2023-04-22 ENCOUNTER — Other Ambulatory Visit: Payer: Self-pay | Admitting: Orthopedic Surgery

## 2023-04-22 DIAGNOSIS — D631 Anemia in chronic kidney disease: Secondary | ICD-10-CM | POA: Diagnosis not present

## 2023-04-22 DIAGNOSIS — M8008XD Age-related osteoporosis with current pathological fracture, vertebra(e), subsequent encounter for fracture with routine healing: Secondary | ICD-10-CM | POA: Diagnosis not present

## 2023-04-22 DIAGNOSIS — E785 Hyperlipidemia, unspecified: Secondary | ICD-10-CM | POA: Diagnosis not present

## 2023-04-22 DIAGNOSIS — E1122 Type 2 diabetes mellitus with diabetic chronic kidney disease: Secondary | ICD-10-CM | POA: Diagnosis not present

## 2023-04-22 DIAGNOSIS — J9601 Acute respiratory failure with hypoxia: Secondary | ICD-10-CM | POA: Diagnosis not present

## 2023-04-22 DIAGNOSIS — I129 Hypertensive chronic kidney disease with stage 1 through stage 4 chronic kidney disease, or unspecified chronic kidney disease: Secondary | ICD-10-CM | POA: Diagnosis not present

## 2023-04-22 DIAGNOSIS — I442 Atrioventricular block, complete: Secondary | ICD-10-CM | POA: Diagnosis not present

## 2023-04-22 DIAGNOSIS — S22070A Wedge compression fracture of T9-T10 vertebra, initial encounter for closed fracture: Secondary | ICD-10-CM

## 2023-04-22 DIAGNOSIS — N1832 Chronic kidney disease, stage 3b: Secondary | ICD-10-CM | POA: Diagnosis not present

## 2023-04-22 DIAGNOSIS — D509 Iron deficiency anemia, unspecified: Secondary | ICD-10-CM | POA: Diagnosis not present

## 2023-04-22 NOTE — Progress Notes (Unsigned)
Referring Physician:  No referring provider defined for this encounter.  Primary Physician:  Marina Goodell, MD  History of Present Illness: 04/25/2023 [name] has a history of CKD, HTN, renal mass, osteoporosis, breast CA, renal CA, DM, and hyperlipidemia.   She has a PACEMAKER.   She was seen as hospital consult by Dr. Myer Haff on 04/09/23 for 1 week history of thoracic pain. She was found to have compression fractures at T10 with age indeterminate fracture at T3, previous kyphoplasty at T12 and L4.   She was to wear TLSO brace when out of bed. She is here for follow up.   She continues with constant mid back pain with some radiation into right side. Pain is not improving. She is having difficulty sleeping due to pain. No pain into her arms or legs. No numbness, tingling, or weakness. She is able to ambulate with a walker. She has been wear TLSO at all times.   She is taking norco for pain. No refill needed.   She is undergoing a workup for a breast mass.   Bowel/Bladder Dysfunction: none  The symptoms are causing a significant impact on the patient's life.   Review of Systems:  A 10 point review of systems is negative, except for the pertinent positives and negatives detailed in the HPI.  Past Medical History: Past Medical History:  Diagnosis Date   Acute kidney failure (HCC) 11/16/2020   AKI (acute kidney injury) (HCC) 03/17/2018   Anemia    Cancer (HCC)    breast cancer right mastectomy   Diabetes mellitus without complication (HCC)    GIB (gastrointestinal bleeding) 02/20/2018   Glaucoma    Hyperkalemia 10/24/2020   Hyperlipidemia    Hypertension    Incontinence of bowel    Neuropathy    Odynophagia 10/24/2020   Osteoarthritis    Presence of permanent cardiac pacemaker    Rheumatoid arthritis (HCC)    Syncope and collapse 10/24/2020   Telangiectasias     Past Surgical History: Past Surgical History:  Procedure Laterality Date   BREAST  SURGERY     right mastectomy   CHOLECYSTECTOMY     COLONOSCOPY     COLONOSCOPY WITH PROPOFOL N/A 08/14/2016   Procedure: COLONOSCOPY WITH PROPOFOL;  Surgeon: Christena Deem, MD;  Location[facility] ENDOSCOPY;  Service: Endoscopy;  Laterality: N/A;   COLONOSCOPY WITH PROPOFOL N/A 02/22/2018   Procedure: COLONOSCOPY WITH PROPOFOL;  Surgeon: Wyline Mood, MD;  Location: [facility] ENDOSCOPY;  Service: Gastroenterology;  Laterality: N/A;   ENTEROSCOPY N/A 04/12/2021   Procedure: ENTEROSCOPY;  Surgeon: Toney Reil, MD;  Location: [facility] ENDOSCOPY;  Service: Gastroenterology;  Laterality: N/A;  push   ESOPHAGOGASTRODUODENOSCOPY (EGD) WITH PROPOFOL N/A 08/14/2016   Procedure: ESOPHAGOGASTRODUODENOSCOPY (EGD) WITH PROPOFOL;  Surgeon: Christena Deem, MD;  Location: [facility] ENDOSCOPY;  Service: Endoscopy;  Laterality: N/A;   ESOPHAGOGASTRODUODENOSCOPY (EGD) WITH PROPOFOL N/A 01/30/2018   Procedure: ESOPHAGOGASTRODUODENOSCOPY (EGD) WITH PROPOFOL;  Surgeon: Toledo, Boykin Nearing, MD;  Location: [facility] ENDOSCOPY;  Service: Gastroenterology;  Laterality: N/A;   ESOPHAGOGASTRODUODENOSCOPY (EGD) WITH PROPOFOL N/A 02/22/2018   Procedure: ESOPHAGOGASTRODUODENOSCOPY (EGD) WITH PROPOFOL;  Surgeon: Wyline Mood, MD;  Location: [facility] ENDOSCOPY;  Service: Gastroenterology;  Laterality: N/A;   EXCISIONAL HEMORRHOIDECTOMY     IR RADIOLOGIST EVAL & MGMT  08/07/2022   IR RADIOLOGIST EVAL & MGMT  09/12/2022   KYPHOPLASTY N/A 03/20/2018   Procedure: [id number];  Surgeon: Kennedy Bucker, MD;  Location: [facility] ORS;  Service: Orthopedics;  Laterality: N/A;  PACEMAKER IMPLANT N/A 10/26/2020   Procedure: PACEMAKER IMPLANT;  Surgeon: Marcina Millard, MD;  Location: [facility] INVASIVE CV LAB;  Service: Cardiovascular;  Laterality: N/A;   RADIOLOGY WITH ANESTHESIA N/A 08/31/2022   Procedure: CT renal microwave ablation;  Surgeon: Pernell Dupre, MD;  [facility] OR;  Service: Radiology;  Laterality: N/A;    Allergies: Allergies as of  04/25/2023   (No Known Allergies)    Medications: Outpatient Encounter Medications as of 04/25/2023  Medication Sig   ACCU-CHEK AVIVA PLUS test strip    ACCU-CHEK SOFTCLIX LANCETS lancets once daily Use as instructed.   acetaminophen (TYLENOL) 500 MG tablet Take 500 mg by mouth every 8 (eight) hours as needed for moderate pain or mild pain.   alendronate (FOSAMAX) 70 MG tablet TAKE 1 TABLET BY MOUTH EVERY 7DAYS WITH A FULL GLASS OF WATER. DO NOT LIE DOWN FOR 30 MINUTES   Biotin 1000 MCG tablet Take 1,000 mcg by mouth daily.   Calcium Carb-Cholecalciferol (OYSTER SHELL CALCIUM W/D) 500-5 MG-MCG TABS Take 1 tablet by mouth daily.   Cholecalciferol 50 MCG (2000 UT) CAPS Take 2,000 Units by mouth daily.   feeding supplement, ENSURE ENLIVE, (ENSURE ENLIVE) LIQD Take 237 mLs by mouth 2 (two) times daily between meals.   HYDROcodone-acetaminophen (NORCO/VICODIN) 5-325 MG tablet Take 1 tablet by mouth every 8 (eight) hours as needed for severe pain or moderate pain.   insulin glargine (LANTUS) 100 UNIT/ML Solostar Pen Inject 15 Units into the skin daily. (may take up to 50u based upon blood glucose reading)   Insulin Pen Needle 32G X 4 MM MISC USE 1 NEEDLE SUBCUTANEOUSLY ONCE A DAY AS DIRECTED   losartan (COZAAR) 50 MG tablet Take 25 mg by mouth daily.   Magnesium 100 MG CAPS Take 2 capsules by mouth daily. Glycinate   niacin (VITAMIN B3) 500 MG ER tablet Take 500 mg by mouth at bedtime.   niacin 500 MG CR capsule Take 500 mg by mouth at bedtime.   octreotide (SANDOSTATIN LAR) 10 MG injection Inject 10 mg into the muscle every 28 (twenty-eight) days. Inject 10 mg into the muscle every 28 (twenty-eight) days   omeprazole (PRILOSEC) 40 MG capsule TAKE 1 CAPSULE BY MOUTH EVERY DAY BEFORE BREAKFAST   Probiotic Product (PROBIOTIC PO) Take 1 tablet by mouth daily. 2 billion   SANDOSTATIN LAR DEPOT 20 MG injection Inject 20 mg into the muscle every 28 (twenty-eight) days.   simvastatin (ZOCOR) 20 MG  tablet Take 20 mg by mouth at bedtime.   sitaGLIPtin (JANUVIA) 50 MG tablet Take 50 mg by mouth daily.   TRAVATAN Z 0.004 % SOLN ophthalmic solution Place 1 drop into both eyes at bedtime.   TURMERIC PO Take 538 mg by mouth daily.   vitamin B-12 (CYANOCOBALAMIN) 1000 MCG tablet Take 2,000 mcg by mouth daily.   Facility-Administered Encounter Medications as of 04/25/2023  Medication   0.9 %  sodium chloride infusion    Social History: Social History   Tobacco Use   Smoking status: Former    Current packs/day: 0.00    Types: Cigarettes    Quit date: 07/10/2011    Years since quitting: 11.8   Smokeless tobacco: Never  Vaping Use   Vaping status: Never Used  Substance Use Topics   Alcohol use: No   Drug use: No    Family Medical History: Family History  Problem Relation Age of Onset   Kidney cancer Son    Bladder Cancer Neg Hx  Physical Examination: Vitals:   04/25/23 0847  BP: 110/60    General: Patient is well developed, well nourished, calm, collected, and in no apparent distress. Attention to examination is appropriate.  Respiratory: Patient is breathing without any difficulty.   NEUROLOGICAL:     Awake, alert, oriented to person, place, and time.  Speech is clear and fluent. Fund of knowledge is appropriate.   Cranial Nerves: Pupils equal round and reactive to light.  Facial tone is symmetric.    She has tenderness over lower thoracic area.   No abnormal lesions on exposed skin.   Strength: Side Biceps Triceps Deltoid Interossei Grip Wrist Ext. Wrist Flex.  R 5 5 5 5 5 5 5   L 5 5 5 5 5 5 5    Side Iliopsoas Quads Hamstring PF DF EHL  R 5 5 5 5 5 5   L 5 5 5 5 5 5    Reflexes are 2+ and symmetric at the biceps, brachioradialis, patella and achilles.   Hoffman's is absent.  Clonus is not present.   Bilateral upper and lower extremity sensation is intact to light touch.  She has tremor in right arm with activity.     She is in a WC. Gait not tested.    Medical Decision Making  Imaging: Thoracic xrays dated 04/25/23:  Some progression of T10 fracture from previous thoracic CT. Kyphoplasty at T12. Chronic T3 fracture appears stable.   Radiology report for above xrays not yet available.   CT thoracic and lumbar spine dated 04/08/23:  CT THORACIC AND LUMBAR SPINE FINDINGS   Alignment: Straightening of the normal thoracic kyphosis and normal lumbar lordosis. Mild, focal kyphosis at T12 secondary to wedge deformity.   Vertebral bodies: Age-indeterminate, sclerotic appearing superior endplate wedge deformity T3 (series 6, image 70). New, acute appearing non depressed superior endplate fracture of T10 (series 6, image 69). Unchanged wedge deformity of T12 status post vertebral cement augmentation. Unchanged superior endplate wedge deformity of L4, status post interval vertebral cement augmentation (series 6, image 73).   Disc spaces: Focally mild disc degenerative disease throughout the thoracic spine. Focally severe disc degenerative disease of T11 through L1 and L2-L3 with otherwise mild disc degenerative disease of the lumbar spine. Mild-to-moderate facet degenerative change throughout the lower lumbar spine.   Paraspinous soft tissues: Unremarkable.   IMPRESSION: 1. Spiculated soft tissue mass of the lower outer left breast, which appears to involve the underlying pectoral musculature, measuring 3.8 x 3.3 cm, highly concerning for primary breast malignancy. 2. Redemonstrated lesion of the anterior midportion of the left kidney, slightly diminished in size with increased internal hypodensity and adjacent fat necrosis, generally in keeping with evolving post ablation change. Assessment for residual or recurrent viable tissue is very limited on noncontrast examination. 3. Age-indeterminate, sclerotic appearing superior endplate wedge deformity of T3, not previously imaged. 4. New, acute appearing non depressed superior  endplate fracture of T10. 5. Unchanged wedge deformity of T12 status post vertebral cement augmentation. 6. Unchanged superior endplate wedge deformity of L4, status post interval vertebral cement augmentation. 7. Mild pulmonary fibrosis in a pattern with apical to basal gradient, featuring irregular peripheral interstitial opacity, septal thickening, and ground-glass, with minimal traction bronchiectasis of the lung bases and minimal subpleural bronchiolectasis, without clear evidence of honeycombing. 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 8. Cirrhosis. No noncontrast evidence of focal liver lesion. Recanalization of the umbilical vein. 9. Endometrial thickening or fluid within the endometrial cavity measuring up to 1.1 cm,  abnormal in the late postmenopausal setting. Recommend pelvic ultrasound on a nonemergent, outpatient basis for further evaluation when clinically appropriate. 10. Coronary artery disease.   Aortic Atherosclerosis (ICD10-I70.0).     Electronically Signed   By: Jearld Lesch M.D.   On: 04/08/2023 11:40  I have personally reviewed the images and agree with the above interpretation regarding thoracic/lumbar CT scan.   Assessment and Plan: Ms. Swanton is a pleasant 82 y.o. female who continues with constant mid back pain with some radiation into right side. No pain into her arms or legs. No numbness, tingling, or weakness.   Xrays from today show some progression of T10 fracture from previous thoracic CT.   She is undergoing a workup for a breast mass.  Treatment options discussed with patient and her daughter in law. Following plan made:   - She is still having significant thoracic pain that is not improving with time. Referral to IR to consider kyphoplasty at T10.  - Message sent to Dr. Juliette Alcide as she is unable to have MRI due to pacemaker. Also undergoing workup for breast mass- may need to do biopsy during procedure.  - DEXA  scan ordered. They will call [facility].  - Continue with TLSO brace when up and walking. Can remove to sleep.  - No bending, twisting, or lifting.  - Continue prn hydrocodone from other providers. No refill needed. Reviewed dosing and side effects.   I spent a total of 30 minutes in face-to-face and non-face-to-face activities related to this patient's care today including review of outside records, review of imaging, review of symptoms, physical exam, discussion of differential diagnosis, discussion of treatment options, and documentation.   Thank you for involving me in the care of this patient.   ADDENDUM 04/25/23:  DEXA scan from today (04/25/23) shows osteoporosis. Called patient about results and left a VM for her to call back.   Her daughter in law Emily Faulkner called me back (she was at visit with her today) and I let her know about DEXA results.   Emily Leach PA-C Dept. of Neurosurgery

## 2023-04-24 ENCOUNTER — Other Ambulatory Visit: Payer: Self-pay | Admitting: Oncology

## 2023-04-24 DIAGNOSIS — N6323 Unspecified lump in the left breast, lower outer quadrant: Secondary | ICD-10-CM

## 2023-04-24 DIAGNOSIS — R928 Other abnormal and inconclusive findings on diagnostic imaging of breast: Secondary | ICD-10-CM

## 2023-04-25 ENCOUNTER — Ambulatory Visit
Admission: RE | Admit: 2023-04-25 | Discharge: 2023-04-25 | Disposition: A | Discharge status: Home / Self Care | Payer: Medicare HMO | Source: Ambulatory Visit | Attending: Oncology | Admitting: Oncology

## 2023-04-25 ENCOUNTER — Other Ambulatory Visit: Payer: Self-pay | Admitting: Oncology

## 2023-04-25 ENCOUNTER — Ambulatory Visit
Admission: RE | Admit: 2023-04-25 | Discharge: 2023-04-25 | Disposition: A | Discharge status: Home / Self Care | Payer: Medicare HMO | Source: Ambulatory Visit | Attending: Orthopedic Surgery | Admitting: Orthopedic Surgery

## 2023-04-25 ENCOUNTER — Encounter: Payer: Self-pay | Admitting: Orthopedic Surgery

## 2023-04-25 ENCOUNTER — Ambulatory Visit: Payer: Medicare HMO | Admitting: Orthopedic Surgery

## 2023-04-25 ENCOUNTER — Ambulatory Visit
Admission: RE | Admit: 2023-04-25 | Discharge: 2023-04-25 | Disposition: A | Discharge status: Home / Self Care | Payer: Medicare HMO | Source: Home / Self Care | Attending: Orthopedic Surgery | Admitting: Orthopedic Surgery

## 2023-04-25 VITALS — BP 110/60 | Ht 64.0 in | Wt 160.0 lb

## 2023-04-25 DIAGNOSIS — S22070A Wedge compression fracture of T9-T10 vertebra, initial encounter for closed fracture: Secondary | ICD-10-CM

## 2023-04-25 DIAGNOSIS — Z853 Personal history of malignant neoplasm of breast: Secondary | ICD-10-CM | POA: Insufficient documentation

## 2023-04-25 DIAGNOSIS — Z780 Asymptomatic menopausal state: Secondary | ICD-10-CM | POA: Diagnosis not present

## 2023-04-25 DIAGNOSIS — Z1239 Encounter for other screening for malignant neoplasm of breast: Secondary | ICD-10-CM | POA: Diagnosis not present

## 2023-04-25 DIAGNOSIS — S22070D Wedge compression fracture of T9-T10 vertebra, subsequent encounter for fracture with routine healing: Secondary | ICD-10-CM

## 2023-04-25 DIAGNOSIS — M810 Age-related osteoporosis without current pathological fracture: Secondary | ICD-10-CM | POA: Diagnosis not present

## 2023-04-25 DIAGNOSIS — Z9011 Acquired absence of right breast and nipple: Secondary | ICD-10-CM | POA: Insufficient documentation

## 2023-04-25 DIAGNOSIS — R928 Other abnormal and inconclusive findings on diagnostic imaging of breast: Secondary | ICD-10-CM | POA: Diagnosis not present

## 2023-04-25 DIAGNOSIS — N6323 Unspecified lump in the left breast, lower outer quadrant: Secondary | ICD-10-CM | POA: Insufficient documentation

## 2023-04-25 DIAGNOSIS — N63 Unspecified lump in unspecified breast: Secondary | ICD-10-CM

## 2023-04-25 DIAGNOSIS — C50912 Malignant neoplasm of unspecified site of left female breast: Secondary | ICD-10-CM | POA: Diagnosis not present

## 2023-04-25 DIAGNOSIS — R599 Enlarged lymph nodes, unspecified: Secondary | ICD-10-CM

## 2023-04-25 DIAGNOSIS — M4854XA Collapsed vertebra, not elsewhere classified, thoracic region, initial encounter for fracture: Secondary | ICD-10-CM | POA: Diagnosis not present

## 2023-04-25 DIAGNOSIS — R92322 Mammographic fibroglandular density, left breast: Secondary | ICD-10-CM | POA: Diagnosis not present

## 2023-04-25 NOTE — Patient Instructions (Signed)
It was so nice to see you today. Thank you so much for coming in.    I think your pain is from a compression fracture at T10. Xrays from today show it has gotten a little worse since your imaging in the hospital.   I put in a referral for you to see interventional radiology to consider a kyhoplasty. They will call you to schedule this.   I put in orders for a Bone Density (DEXA)scan: Please call [phone] to schedule with the [facility]. They are located in the [facility]. [address], [place], [place] [address]  Continue with the brace when you are up and walking. You can remove to sleep. You can remove if sitting and watching TV.   No bending, twisting, or lifting.   Continue on hydrocodone as needed for severe pain. Be careful, this can make you sleepy and/or constipated.    Please do not hesitate to call if you have any questions or concerns. You can also message me in MyChart.   Drake Leach PA-C [phone]     The physicians and staff at [facility] at [facility] are committed to providing excellent care. You may receive a survey asking for feedback about your experience at our office. We value you your feedback and appreciate you taking the time to to fill it out. The [facility] leadership team is also available to discuss your experience in person, feel free to contact us [phone].

## 2023-04-26 ENCOUNTER — Other Ambulatory Visit: Payer: Self-pay | Admitting: Gastroenterology

## 2023-04-26 DIAGNOSIS — M8008XD Age-related osteoporosis with current pathological fracture, vertebra(e), subsequent encounter for fracture with routine healing: Secondary | ICD-10-CM | POA: Diagnosis not present

## 2023-04-26 DIAGNOSIS — I129 Hypertensive chronic kidney disease with stage 1 through stage 4 chronic kidney disease, or unspecified chronic kidney disease: Secondary | ICD-10-CM | POA: Diagnosis not present

## 2023-04-26 DIAGNOSIS — D509 Iron deficiency anemia, unspecified: Secondary | ICD-10-CM | POA: Diagnosis not present

## 2023-04-26 DIAGNOSIS — I442 Atrioventricular block, complete: Secondary | ICD-10-CM | POA: Diagnosis not present

## 2023-04-26 DIAGNOSIS — E785 Hyperlipidemia, unspecified: Secondary | ICD-10-CM | POA: Diagnosis not present

## 2023-04-26 DIAGNOSIS — D631 Anemia in chronic kidney disease: Secondary | ICD-10-CM | POA: Diagnosis not present

## 2023-04-26 DIAGNOSIS — E1122 Type 2 diabetes mellitus with diabetic chronic kidney disease: Secondary | ICD-10-CM | POA: Diagnosis not present

## 2023-04-26 DIAGNOSIS — J9601 Acute respiratory failure with hypoxia: Secondary | ICD-10-CM | POA: Diagnosis not present

## 2023-04-26 DIAGNOSIS — N1832 Chronic kidney disease, stage 3b: Secondary | ICD-10-CM | POA: Diagnosis not present

## 2023-04-29 DIAGNOSIS — D509 Iron deficiency anemia, unspecified: Secondary | ICD-10-CM | POA: Diagnosis not present

## 2023-04-29 DIAGNOSIS — M8008XD Age-related osteoporosis with current pathological fracture, vertebra(e), subsequent encounter for fracture with routine healing: Secondary | ICD-10-CM | POA: Diagnosis not present

## 2023-04-29 DIAGNOSIS — E1122 Type 2 diabetes mellitus with diabetic chronic kidney disease: Secondary | ICD-10-CM | POA: Diagnosis not present

## 2023-04-29 DIAGNOSIS — D631 Anemia in chronic kidney disease: Secondary | ICD-10-CM | POA: Diagnosis not present

## 2023-04-29 DIAGNOSIS — I129 Hypertensive chronic kidney disease with stage 1 through stage 4 chronic kidney disease, or unspecified chronic kidney disease: Secondary | ICD-10-CM | POA: Diagnosis not present

## 2023-04-29 DIAGNOSIS — I442 Atrioventricular block, complete: Secondary | ICD-10-CM | POA: Diagnosis not present

## 2023-04-29 DIAGNOSIS — J9601 Acute respiratory failure with hypoxia: Secondary | ICD-10-CM | POA: Diagnosis not present

## 2023-04-29 DIAGNOSIS — N1832 Chronic kidney disease, stage 3b: Secondary | ICD-10-CM | POA: Diagnosis not present

## 2023-04-29 DIAGNOSIS — E785 Hyperlipidemia, unspecified: Secondary | ICD-10-CM | POA: Diagnosis not present

## 2023-04-30 ENCOUNTER — Ambulatory Visit: Payer: Medicare HMO | Admitting: Gastroenterology

## 2023-04-30 ENCOUNTER — Encounter: Payer: Self-pay | Admitting: Gastroenterology

## 2023-05-01 DIAGNOSIS — E1122 Type 2 diabetes mellitus with diabetic chronic kidney disease: Secondary | ICD-10-CM | POA: Diagnosis not present

## 2023-05-01 DIAGNOSIS — I442 Atrioventricular block, complete: Secondary | ICD-10-CM | POA: Diagnosis not present

## 2023-05-01 DIAGNOSIS — M8008XD Age-related osteoporosis with current pathological fracture, vertebra(e), subsequent encounter for fracture with routine healing: Secondary | ICD-10-CM | POA: Diagnosis not present

## 2023-05-01 DIAGNOSIS — D631 Anemia in chronic kidney disease: Secondary | ICD-10-CM | POA: Diagnosis not present

## 2023-05-01 DIAGNOSIS — I129 Hypertensive chronic kidney disease with stage 1 through stage 4 chronic kidney disease, or unspecified chronic kidney disease: Secondary | ICD-10-CM | POA: Diagnosis not present

## 2023-05-01 DIAGNOSIS — N1832 Chronic kidney disease, stage 3b: Secondary | ICD-10-CM | POA: Diagnosis not present

## 2023-05-02 ENCOUNTER — Encounter: Payer: Self-pay | Admitting: Oncology

## 2023-05-02 ENCOUNTER — Ambulatory Visit
Admission: RE | Admit: 2023-05-02 | Discharge: 2023-05-02 | Disposition: A | Discharge status: Home / Self Care | Payer: Medicare HMO | Source: Ambulatory Visit | Attending: Orthopedic Surgery | Admitting: Orthopedic Surgery

## 2023-05-02 ENCOUNTER — Ambulatory Visit: Payer: Medicare HMO | Admitting: Neurosurgery

## 2023-05-02 ENCOUNTER — Ambulatory Visit
Admission: RE | Admit: 2023-05-02 | Discharge: 2023-05-02 | Disposition: A | Discharge status: Home / Self Care | Payer: Medicare HMO | Source: Ambulatory Visit | Attending: Interventional Radiology | Admitting: Interventional Radiology

## 2023-05-02 DIAGNOSIS — M8088XA Other osteoporosis with current pathological fracture, vertebra(e), initial encounter for fracture: Secondary | ICD-10-CM | POA: Diagnosis not present

## 2023-05-02 DIAGNOSIS — N6320 Unspecified lump in the left breast, unspecified quadrant: Secondary | ICD-10-CM | POA: Diagnosis not present

## 2023-05-02 DIAGNOSIS — R161 Splenomegaly, not elsewhere classified: Secondary | ICD-10-CM

## 2023-05-02 DIAGNOSIS — S22070A Wedge compression fracture of T9-T10 vertebra, initial encounter for closed fracture: Secondary | ICD-10-CM | POA: Diagnosis not present

## 2023-05-02 DIAGNOSIS — N2889 Other specified disorders of kidney and ureter: Secondary | ICD-10-CM

## 2023-05-02 DIAGNOSIS — N289 Disorder of kidney and ureter, unspecified: Secondary | ICD-10-CM | POA: Diagnosis not present

## 2023-05-02 DIAGNOSIS — S22070D Wedge compression fracture of T9-T10 vertebra, subsequent encounter for fracture with routine healing: Secondary | ICD-10-CM

## 2023-05-02 HISTORY — PX: IR RADIOLOGIST EVAL & MGMT: IMG5224

## 2023-05-02 NOTE — H&P (Addendum)
Interventional Radiology - Clinic Visit H&P    Referring Provider: Drake Leach, PA-C  Reason for Visit: T10 compression fracture; follow up left renal MWA     History of Present Illness  Emily Faulkner is a 82 y.o. female with a relevant past medical history of renal tumor s/p MWA in Feb 2024, osteoporosis, and new breast mass seen today in Interventional Radiology clinic primarily for multiple medical problems, including ongoing severe back pain and follow up from previous left renal MWA ablation.   The patient reports the insidious onset severe lower back pain, which she became acutely aware of approximately 2-3 weeks ago while washing dishes.  The pain continued to worsen affecting her ability to walk, ultimately resulting in her presentation to the local emergency room on April 08, 2023.  This resulted in CT imaging of the abdomen and pelvis and thoracic and lumbar spine, which demonstrated a new acute subacute compression fracture of the T10 vertebral body superior endplate.  She was evaluated by neurosurgery in the hospital, and was discharged several days later with a TLSO brace and narcotic pain medication.   Sensory discharge, she continues to report severe 10/10 back pain.  She has been compliant with her TLSO brace.  She has had home physical therapy 2 times per week.  With the pain medication, she reports that her pain may improved to 5 over 10.  She reports severe disability questionnaire with 20/24 positive.     Of note, she had a recent DEXA scan on April 25, 2023 which demonstrated osteoporosis.  She is being treated for osteoporosis with alendronate sodium 70 mg once weekly.    She has also had previous kyphoplasties (T12 and L4), most recently towards the end of 2023.  She has reported good relief with previous kyphoplasties.   Lastly, her most recent imaging in the hospital demonstrated a left breast mass, which is currently being worked up.      Additional Past  Medical History Past Medical History:  Diagnosis Date   Acute kidney failure (HCC) 11/16/2020   AKI (acute kidney injury) (HCC) 03/17/2018   Anemia    Cancer (HCC)    breast cancer right mastectomy   Diabetes mellitus without complication (HCC)    GIB (gastrointestinal bleeding) 02/20/2018   Glaucoma    Hyperkalemia 10/24/2020   Hyperlipidemia    Hypertension    Incontinence of bowel    Neuropathy    Odynophagia 10/24/2020   Osteoarthritis    Presence of permanent cardiac pacemaker    Rheumatoid arthritis (HCC)    Syncope and collapse 10/24/2020   Telangiectasias      Surgical History  Past Surgical History:  Procedure Laterality Date   BREAST SURGERY     right mastectomy   CHOLECYSTECTOMY     COLONOSCOPY     COLONOSCOPY WITH PROPOFOL N/A 08/14/2016   Procedure: COLONOSCOPY WITH PROPOFOL;  Surgeon: Christena Deem, MD;  Location: [facility] ENDOSCOPY;  Service: Endoscopy;  Laterality: N/A;   COLONOSCOPY WITH PROPOFOL N/A 02/22/2018   Procedure: COLONOSCOPY WITH PROPOFOL;  Surgeon: Wyline Mood, MD;  Location: [facility] ENDOSCOPY;  Service: Gastroenterology;  Laterality: N/A;   ENTEROSCOPY N/A 04/12/2021   Procedure: ENTEROSCOPY;  Surgeon: Toney Reil, MD;  Location: [facility] ENDOSCOPY;  Service: Gastroenterology;  Laterality: N/A;  push   ESOPHAGOGASTRODUODENOSCOPY (EGD) WITH PROPOFOL N/A 08/14/2016   Procedure: ESOPHAGOGASTRODUODENOSCOPY (EGD) WITH PROPOFOL;  Surgeon: Christena Deem, MD;  Location: [facility] ENDOSCOPY;  Service: Endoscopy;  Laterality: N/A;   ESOPHAGOGASTRODUODENOSCOPY (  EGD) WITH PROPOFOL N/A 01/30/2018   Procedure: ESOPHAGOGASTRODUODENOSCOPY (EGD) WITH PROPOFOL;  Surgeon: Toledo, Boykin Nearing, MD;  Location: [facility] ENDOSCOPY;  Service: Gastroenterology;  Laterality: N/A;   ESOPHAGOGASTRODUODENOSCOPY (EGD) WITH PROPOFOL N/A 02/22/2018   Procedure: ESOPHAGOGASTRODUODENOSCOPY (EGD) WITH PROPOFOL;  Surgeon: Wyline Mood, MD;  Location: [facility] ENDOSCOPY;  Service: Gastroenterology;   Laterality: N/A;   EXCISIONAL HEMORRHOIDECTOMY     IR RADIOLOGIST EVAL & MGMT  08/07/2022   IR RADIOLOGIST EVAL & MGMT  09/12/2022   KYPHOPLASTY N/A 03/20/2018   Procedure: [id number];  Surgeon: Kennedy Bucker, MD;  Location: [facility] ORS;  Service: Orthopedics;  Laterality: N/A;   PACEMAKER IMPLANT N/A 10/26/2020   Procedure: PACEMAKER IMPLANT;  Surgeon: Marcina Millard, MD;  Location: [facility] INVASIVE CV LAB;  Service: Cardiovascular;  Laterality: N/A;   RADIOLOGY WITH ANESTHESIA N/A 08/31/2022   Procedure: CT renal microwave ablation;  Surgeon: Pernell Dupre, MD;  Location: [facility] OR;  Service: Radiology;  Laterality: N/A;     Medications  I have reviewed the current medication list. Refer to chart for details. Current Outpatient Medications  Medication Instructions   ACCU-CHEK AVIVA PLUS test strip No dose, route, or frequency recorded.   ACCU-CHEK SOFTCLIX LANCETS lancets once daily Use as instructed.   acetaminophen (TYLENOL) 500 mg, Every 8 hours PRN   alendronate (FOSAMAX) 70 MG tablet TAKE 1 TABLET BY MOUTH EVERY 7DAYS WITH A FULL GLASS OF WATER. DO NOT LIE DOWN FOR 30 MINUTES   Biotin 1,000 mcg, Oral, Daily   Calcium Carb-Cholecalciferol (OYSTER SHELL CALCIUM W/D) 500-5 MG-MCG TABS 1 tablet, Oral, Daily   Cholecalciferol 2,000 Units, Oral, Daily   cyanocobalamin (VITAMIN B12) 2,000 mcg, Oral, Daily   feeding supplement, ENSURE ENLIVE, (ENSURE ENLIVE) LIQD 237 mLs, Oral, 2 times daily between meals   HYDROcodone-acetaminophen (NORCO/VICODIN) 5-325 MG tablet 1 tablet, Oral, Every 8 hours PRN   insulin glargine (LANTUS) 15 Units, Subcutaneous, Daily, (may take up to 50u based upon blood glucose reading)   Insulin Pen Needle 32G X 4 MM MISC USE 1 NEEDLE SUBCUTANEOUSLY ONCE A DAY AS DIRECTED   losartan (COZAAR) 25 mg, Oral, Daily   Magnesium 100 MG CAPS 2 capsules, Oral, Daily, Glycinate   niacin 500 mg, Oral, Daily at bedtime   omeprazole (PRILOSEC) 40 MG capsule TAKE 1 CAPSULE BY  MOUTH EVERY DAY BEFORE BREAKFAST   Probiotic Product (PROBIOTIC PO) 1 tablet, Oral, Daily, 2 billion    simvastatin (ZOCOR) 20 mg, Oral, Daily at bedtime   sitaGLIPtin (JANUVIA) 50 mg, Oral, Daily   TRAVATAN Z 0.004 % SOLN ophthalmic solution 1 drop, Both Eyes, Daily at bedtime   TURMERIC PO 538 mg, Oral, Daily      Allergies No Known Allergies Does patient have contrast allergy: No     Physical Exam Current Vitals Temp: 98.7 F (37.1 C) (Temp Source: Oral)  Pulse Rate: 89  Resp: 16  BP: (!) 131/53  SpO2: 92 %        There is no height or weight on file to calculate BMI.  General: Alert and answers questions appropriately. TLSO brace on.  HEENT: Normocephalic, atraumatic. Conjunctivae normal without scleral icterus. Cardiac: Regular rate. No dependent edema. Pulmonary: Normal work of breathing. On room air. Back: Mid back TTP.    Pertinent Lab Results    Latest Ref Rng & Units 04/10/2023    5:31 AM 04/09/2023    7:05 AM 04/08/2023    7:14 AM  CBC  WBC 4.0 - 10.5 K/uL 4.0  3.7  5.5   Hemoglobin 12.0 - 15.0 g/dL 9.0  28.4  13.2   Hematocrit 36.0 - 46.0 % 27.5  31.4  33.8   Platelets 150 - 400 K/uL 88  86  115       Latest Ref Rng & Units 04/11/2023    3:17 AM 04/10/2023    5:31 AM 04/09/2023    7:05 AM  CMP  Glucose 70 - 99 mg/dL  76  440   BUN 8 - 23 mg/dL  30  29   Creatinine 1.02 - 1.00 mg/dL 7.25  3.66  4.40   Sodium 135 - 145 mmol/L  139  136   Potassium 3.5 - 5.1 mmol/L  3.9  4.5   Chloride 98 - 111 mmol/L  110  109   CO2 22 - 32 mmol/L  24  20   Calcium 8.9 - 10.3 mg/dL  8.5  8.4   Total Protein 6.5 - 8.1 g/dL   5.8   Total Bilirubin 0.3 - 1.2 mg/dL   0.9   Alkaline Phos 38 - 126 U/L   63   AST 15 - 41 U/L   37   ALT 0 - 44 U/L   17       Relevant and/or Recent Imaging: CT A/P, T+L spine 04/08/2023  1. Spiculated soft tissue mass of the lower outer left breast, which appears to involve the underlying pectoral musculature, measuring 3.8 x 3.3 cm,  highly concerning for primary breast malignancy. 2. Redemonstrated lesion of the anterior midportion of the left kidney, slightly diminished in size with increased internal hypodensity and adjacent fat necrosis, generally in keeping with evolving post ablation change. Assessment for residual or recurrent viable tissue is very limited on noncontrast examination. 3. Age-indeterminate, sclerotic appearing superior endplate wedge deformity of T3, not previously imaged. 4. New, acute appearing non depressed superior endplate fracture of T10. 5. Unchanged wedge deformity of T12 status post vertebral cement augmentation. 6. Unchanged superior endplate wedge deformity of L4, status post interval vertebral cement augmentation. 7. Mild pulmonary fibrosis in a pattern with apical to basal gradient, featuring irregular peripheral interstitial opacity, septal thickening, and ground-glass, with minimal traction bronchiectasis of the lung bases and minimal subpleural bronchiolectasis, without clear evidence of honeycombing. 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 8. Cirrhosis. No noncontrast evidence of focal liver lesion. Recanalization of the umbilical vein. 9. Endometrial thickening or fluid within the endometrial cavity measuring up to 1.1 cm, abnormal in the late postmenopausal setting. Recommend pelvic ultrasound on a nonemergent, outpatient basis for further evaluation when clinically appropriate. 10. Coronary artery disease.      Assessment & Plan:   Patient present for management of multiple medical problems:   T10 compression fracture   Patient has suffered subacute osteoporotic fracture of the T10 vertebra.   History and exam have demonstrated the following:  Acute/Subacute fracture by imaging dated 04/08/2023, Pain on exam concordant with level of fracture, Failure of conservative therapy (imcluding TLSO brace and home PT) and pain refractory to  narcotic pain mediation, and Significant disability on the [employer] Disability Questionnaire with 20/24 positive symptoms, reflecting significant impact/impairment of (ADLs)   ICD-10-CM Codes that Support Medical Necessity ([email or web address]?[email or web address])  M80.08XA    Age-related osteoporosis with current pathological fracture, vertebra(e), initial encounter for fracture and S22.070A    Wedge compression fracture of T9-T10 vertebra, initial encounter for closed fracture    Plan:  T10 vertebral body augmentation with balloon kyphoplasty (+/- biopsy)  Post-procedure disposition: outpatient DRI_A  Medication holds: TBD  The patient has suffered a fracture of the T10 vertebral body. It is recommended that patients aged 67 years or older be evaluated for possible testing or treatment of osteoporosis. A copy of this consult report is sent to the patient's referring physician.  Advanced Care Plan: The patient did not want to provide an Advanced Care Plan at the time of this visit     2. Left renal mass  - S/p successful left renal MWA Feb 2024  - [place] CT shows interval decrease in size (independently measured; now 3.2 x 2.2 cm, previously 3.7x3.2 cm)  - Plan for repeat CT renal mass protocol in 6 mo    3. Left breast mass  - Undergoing workup  - May offer biopsy of T10 vertebral body at time of KP if safe to do so, although low suspicion for pathologic fracture based on imaging     Total time spent on today's visit was over 60 Minutes, including both face-to-face time and non face-to-face time, personally spent on review of chart (including labs and relevant imaging), discussing further workup and treatment options, referral to specialist if needed, reviewing outside records if pertinent, answering patient questions, and coordinating care regarding T10 fracture, renal mass followup as well as management strategy.       Olive Bass, MD   Vascular and Interventional Radiology 05/02/2023 4:29 PM        Addendum 05/06/2023   On personal review of CT T spine dated 04/08/2023, there is a measured 25% height loss of the mid vertebral body at the location of the superior end plate fracture.   Olive Bass, MD  Vascular and Interventional Radiology 05/06/2023 12:46 PM

## 2023-05-03 DIAGNOSIS — M8008XD Age-related osteoporosis with current pathological fracture, vertebra(e), subsequent encounter for fracture with routine healing: Secondary | ICD-10-CM | POA: Diagnosis not present

## 2023-05-03 DIAGNOSIS — I442 Atrioventricular block, complete: Secondary | ICD-10-CM | POA: Diagnosis not present

## 2023-05-03 DIAGNOSIS — E1122 Type 2 diabetes mellitus with diabetic chronic kidney disease: Secondary | ICD-10-CM | POA: Diagnosis not present

## 2023-05-03 DIAGNOSIS — E785 Hyperlipidemia, unspecified: Secondary | ICD-10-CM | POA: Diagnosis not present

## 2023-05-03 DIAGNOSIS — N1832 Chronic kidney disease, stage 3b: Secondary | ICD-10-CM | POA: Diagnosis not present

## 2023-05-03 DIAGNOSIS — D509 Iron deficiency anemia, unspecified: Secondary | ICD-10-CM | POA: Diagnosis not present

## 2023-05-03 DIAGNOSIS — I129 Hypertensive chronic kidney disease with stage 1 through stage 4 chronic kidney disease, or unspecified chronic kidney disease: Secondary | ICD-10-CM | POA: Diagnosis not present

## 2023-05-03 DIAGNOSIS — J9601 Acute respiratory failure with hypoxia: Secondary | ICD-10-CM | POA: Diagnosis not present

## 2023-05-03 DIAGNOSIS — D631 Anemia in chronic kidney disease: Secondary | ICD-10-CM | POA: Diagnosis not present

## 2023-05-03 LAB — CBC
HCT: 35.3 % (ref 35.0–45.0)
Hemoglobin: 11 g/dL — ABNORMAL LOW (ref 11.7–15.5)
MCH: 32 pg (ref 27.0–33.0)
MCHC: 31.2 g/dL — ABNORMAL LOW (ref 32.0–36.0)
MCV: 102.6 fL — ABNORMAL HIGH (ref 80.0–100.0)
MPV: 10.9 fL (ref 7.5–12.5)
Platelets: 143 10*3/uL (ref 140–400)
RBC: 3.44 10*6/uL — ABNORMAL LOW (ref 3.80–5.10)
RDW: 14.3 % (ref 11.0–15.0)
WBC: 4.8 10*3/uL (ref 3.8–10.8)

## 2023-05-03 LAB — COMPLETE METABOLIC PANEL WITH GFR
AG Ratio: 1.3 (calc) (ref 1.0–2.5)
ALT: 15 U/L (ref 6–29)
AST: 28 U/L (ref 10–35)
Albumin: 3.9 g/dL (ref 3.6–5.1)
Alkaline phosphatase (APISO): 85 U/L (ref 37–153)
BUN/Creatinine Ratio: 12 (calc) (ref 6–22)
BUN: 17 mg/dL (ref 7–25)
CO2: 25 mmol/L (ref 20–32)
Calcium: 9.9 mg/dL (ref 8.6–10.4)
Chloride: 104 mmol/L (ref 98–110)
Creat: 1.46 mg/dL — ABNORMAL HIGH (ref 0.60–0.95)
Globulin: 3.1 g/dL (ref 1.9–3.7)
Glucose, Bld: 221 mg/dL — ABNORMAL HIGH (ref 65–99)
Potassium: 4.3 mmol/L (ref 3.5–5.3)
Sodium: 140 mmol/L (ref 135–146)
Total Bilirubin: 0.9 mg/dL (ref 0.2–1.2)
Total Protein: 7 g/dL (ref 6.1–8.1)
eGFR: 36 mL/min/{1.73_m2} — ABNORMAL LOW (ref >=60)

## 2023-05-03 LAB — PROTIME-INR

## 2023-05-06 ENCOUNTER — Encounter: Payer: Self-pay | Admitting: *Deleted

## 2023-05-06 NOTE — Progress Notes (Signed)
Checked with [facility] about biopsy scheduling, Patients family said they would call [facility] back after her appointment for her back pain to schedule the biopsy.  Will wait for biopsy to be scheduled and then get her an appointment with Dr. Smith Robert after.

## 2023-05-07 ENCOUNTER — Other Ambulatory Visit: Payer: Self-pay | Admitting: Interventional Radiology

## 2023-05-07 DIAGNOSIS — E785 Hyperlipidemia, unspecified: Secondary | ICD-10-CM | POA: Diagnosis not present

## 2023-05-07 DIAGNOSIS — N1832 Chronic kidney disease, stage 3b: Secondary | ICD-10-CM | POA: Diagnosis not present

## 2023-05-07 DIAGNOSIS — D509 Iron deficiency anemia, unspecified: Secondary | ICD-10-CM | POA: Diagnosis not present

## 2023-05-07 DIAGNOSIS — M8008XA Age-related osteoporosis with current pathological fracture, vertebra(e), initial encounter for fracture: Secondary | ICD-10-CM

## 2023-05-07 DIAGNOSIS — M8008XD Age-related osteoporosis with current pathological fracture, vertebra(e), subsequent encounter for fracture with routine healing: Secondary | ICD-10-CM | POA: Diagnosis not present

## 2023-05-07 DIAGNOSIS — I129 Hypertensive chronic kidney disease with stage 1 through stage 4 chronic kidney disease, or unspecified chronic kidney disease: Secondary | ICD-10-CM | POA: Diagnosis not present

## 2023-05-07 DIAGNOSIS — S22070A Wedge compression fracture of T9-T10 vertebra, initial encounter for closed fracture: Secondary | ICD-10-CM

## 2023-05-07 DIAGNOSIS — I442 Atrioventricular block, complete: Secondary | ICD-10-CM | POA: Diagnosis not present

## 2023-05-07 DIAGNOSIS — E1122 Type 2 diabetes mellitus with diabetic chronic kidney disease: Secondary | ICD-10-CM | POA: Diagnosis not present

## 2023-05-07 DIAGNOSIS — D631 Anemia in chronic kidney disease: Secondary | ICD-10-CM | POA: Diagnosis not present

## 2023-05-07 DIAGNOSIS — J9601 Acute respiratory failure with hypoxia: Secondary | ICD-10-CM | POA: Diagnosis not present

## 2023-05-08 ENCOUNTER — Inpatient Hospital Stay: Payer: Medicare HMO

## 2023-05-09 DIAGNOSIS — I10 Essential (primary) hypertension: Secondary | ICD-10-CM | POA: Diagnosis not present

## 2023-05-09 DIAGNOSIS — E1122 Type 2 diabetes mellitus with diabetic chronic kidney disease: Secondary | ICD-10-CM | POA: Diagnosis not present

## 2023-05-09 DIAGNOSIS — D631 Anemia in chronic kidney disease: Secondary | ICD-10-CM | POA: Diagnosis not present

## 2023-05-09 DIAGNOSIS — E1140 Type 2 diabetes mellitus with diabetic neuropathy, unspecified: Secondary | ICD-10-CM | POA: Diagnosis not present

## 2023-05-09 DIAGNOSIS — I129 Hypertensive chronic kidney disease with stage 1 through stage 4 chronic kidney disease, or unspecified chronic kidney disease: Secondary | ICD-10-CM | POA: Diagnosis not present

## 2023-05-09 DIAGNOSIS — J9601 Acute respiratory failure with hypoxia: Secondary | ICD-10-CM | POA: Diagnosis not present

## 2023-05-09 DIAGNOSIS — E782 Mixed hyperlipidemia: Secondary | ICD-10-CM | POA: Diagnosis not present

## 2023-05-09 DIAGNOSIS — M8008XD Age-related osteoporosis with current pathological fracture, vertebra(e), subsequent encounter for fracture with routine healing: Secondary | ICD-10-CM | POA: Diagnosis not present

## 2023-05-09 DIAGNOSIS — M2550 Pain in unspecified joint: Secondary | ICD-10-CM | POA: Diagnosis not present

## 2023-05-09 DIAGNOSIS — I700 Atherosclerosis of aorta: Secondary | ICD-10-CM | POA: Diagnosis not present

## 2023-05-09 DIAGNOSIS — N1832 Chronic kidney disease, stage 3b: Secondary | ICD-10-CM | POA: Diagnosis not present

## 2023-05-09 DIAGNOSIS — Z794 Long term (current) use of insulin: Secondary | ICD-10-CM | POA: Diagnosis not present

## 2023-05-09 DIAGNOSIS — I442 Atrioventricular block, complete: Secondary | ICD-10-CM | POA: Diagnosis not present

## 2023-05-09 DIAGNOSIS — D509 Iron deficiency anemia, unspecified: Secondary | ICD-10-CM | POA: Diagnosis not present

## 2023-05-09 DIAGNOSIS — E785 Hyperlipidemia, unspecified: Secondary | ICD-10-CM | POA: Diagnosis not present

## 2023-05-14 ENCOUNTER — Inpatient Hospital Stay
Admission: RE | Admit: 2023-05-14 | Discharge: 2023-05-14 | Disposition: A | Discharge status: Home / Self Care | Payer: Medicare HMO | Source: Ambulatory Visit | Attending: Interventional Radiology

## 2023-05-14 ENCOUNTER — Other Ambulatory Visit (HOSPITAL_COMMUNITY)
Admission: RE | Admit: 2023-05-14 | Discharge: 2023-05-14 | Disposition: A | Discharge status: Home / Self Care | Payer: Medicare HMO | Source: Ambulatory Visit | Attending: Interventional Radiology | Admitting: Interventional Radiology

## 2023-05-14 VITALS — BP 110/41 | HR 61 | Resp 10

## 2023-05-14 DIAGNOSIS — S22070A Wedge compression fracture of T9-T10 vertebra, initial encounter for closed fracture: Secondary | ICD-10-CM | POA: Insufficient documentation

## 2023-05-14 DIAGNOSIS — N6323 Unspecified lump in the left breast, lower outer quadrant: Secondary | ICD-10-CM

## 2023-05-14 DIAGNOSIS — M8008XA Age-related osteoporosis with current pathological fracture, vertebra(e), initial encounter for fracture: Secondary | ICD-10-CM

## 2023-05-14 DIAGNOSIS — S22079A Unspecified fracture of T9-T10 vertebra, initial encounter for closed fracture: Secondary | ICD-10-CM | POA: Diagnosis not present

## 2023-05-14 HISTORY — PX: IR KYPHO THORACIC WITH BONE BIOPSY: IMG5518

## 2023-05-14 MED ORDER — SODIUM CHLORIDE 0.9 % IV SOLN: INTRAVENOUS | Status: DC

## 2023-05-14 MED ORDER — CEFAZOLIN SODIUM-DEXTROSE 2-4 GM/100ML-% IV SOLN: 2.0000 g | INTRAVENOUS | Status: DC

## 2023-05-14 MED ORDER — ACETAMINOPHEN 10 MG/ML IV SOLN: 1000.0000 mg | Freq: Once | INTRAVENOUS | Status: DC

## 2023-05-14 MED ORDER — FENTANYL CITRATE PF 50 MCG/ML IJ SOSY: 25.0000 ug | PREFILLED_SYRINGE | INTRAMUSCULAR | Status: DC | PRN

## 2023-05-14 MED ORDER — MIDAZOLAM HCL 2 MG/2ML IJ SOLN
INTRAMUSCULAR | Status: DC | PRN
Start: 2023-05-14 — End: 2023-05-15
  Administered 2023-05-14: .5 mg via INTRAVENOUS
  Administered 2023-05-14: 1 mg via INTRAVENOUS

## 2023-05-14 MED ORDER — FENTANYL CITRATE (PF) 100 MCG/2ML IJ SOLN
INTRAMUSCULAR | Status: DC | PRN
Start: 2023-05-14 — End: 2023-05-15
  Administered 2023-05-14: 25 ug via INTRAVENOUS
  Administered 2023-05-14: 50 ug via INTRAVENOUS

## 2023-05-14 MED ORDER — MIDAZOLAM HCL 2 MG/2ML IJ SOLN: 1.0000 mg | INTRAMUSCULAR | Status: DC | PRN

## 2023-05-14 NOTE — Discharge Instructions (Signed)
Kyphoplasty Post Procedure Discharge Instructions  May resume a regular diet and any medications that you routinely take (including pain medications). However, if you are taking Aspirin or an anticoagulant/blood thinner you will be told when you can resume taking these by the healthcare provider. No driving day of procedure. The day of your procedure take it easy. You may use an ice pack as needed to injection sites on back.  Ice to back 30 minutes on and 30 minutes off, as needed. May remove bandaids tomorrow after taking a shower. Replace daily with a clean bandaid until healed.  Do not lift anything heavier than a milk jug for 1-2 weeks or determined by your physician.  Follow up with your physician in 2 weeks.    Please contact our office at [phone] for the following symptoms or if you have any questions:  Fever greater than 100 degrees Increased swelling, pain, or redness at injection site. Increased back and/or leg pain New numbness or change in symptoms from before the procedure.    Thank you for visiting [facility]. 

## 2023-05-20 DIAGNOSIS — I442 Atrioventricular block, complete: Secondary | ICD-10-CM | POA: Diagnosis not present

## 2023-05-20 DIAGNOSIS — E785 Hyperlipidemia, unspecified: Secondary | ICD-10-CM | POA: Diagnosis not present

## 2023-05-20 DIAGNOSIS — J9601 Acute respiratory failure with hypoxia: Secondary | ICD-10-CM | POA: Diagnosis not present

## 2023-05-20 DIAGNOSIS — D509 Iron deficiency anemia, unspecified: Secondary | ICD-10-CM | POA: Diagnosis not present

## 2023-05-20 DIAGNOSIS — M8008XD Age-related osteoporosis with current pathological fracture, vertebra(e), subsequent encounter for fracture with routine healing: Secondary | ICD-10-CM | POA: Diagnosis not present

## 2023-05-20 DIAGNOSIS — D631 Anemia in chronic kidney disease: Secondary | ICD-10-CM | POA: Diagnosis not present

## 2023-05-20 DIAGNOSIS — I129 Hypertensive chronic kidney disease with stage 1 through stage 4 chronic kidney disease, or unspecified chronic kidney disease: Secondary | ICD-10-CM | POA: Diagnosis not present

## 2023-05-20 DIAGNOSIS — E1122 Type 2 diabetes mellitus with diabetic chronic kidney disease: Secondary | ICD-10-CM | POA: Diagnosis not present

## 2023-05-20 DIAGNOSIS — N1832 Chronic kidney disease, stage 3b: Secondary | ICD-10-CM | POA: Diagnosis not present

## 2023-05-21 ENCOUNTER — Telehealth: Payer: Self-pay

## 2023-05-21 NOTE — Telephone Encounter (Signed)
Phone call to pt to follow up from her kyphoplasty on 05/16/23. Pt reports her pain is completely gone post procedure but is still having "a lot of pain a little lower down".  Pt denies any signs of infection, redness at the site, draining or fever. Pt has no complaints at this time and will be scheduled for a telephone follow up with Dr. Archer Asa next week. Pt advised to call back if anything were to change or any concerns arise and we will arrange an in person appointment. Pt verbalized understanding.

## 2023-05-22 ENCOUNTER — Other Ambulatory Visit: Payer: Self-pay | Admitting: Interventional Radiology

## 2023-05-22 ENCOUNTER — Other Ambulatory Visit: Payer: Self-pay | Admitting: Gastroenterology

## 2023-05-22 DIAGNOSIS — S22000A Wedge compression fracture of unspecified thoracic vertebra, initial encounter for closed fracture: Secondary | ICD-10-CM

## 2023-05-22 LAB — SURGICAL PATHOLOGY

## 2023-05-23 ENCOUNTER — Ambulatory Visit
Admission: RE | Admit: 2023-05-23 | Discharge: 2023-05-23 | Disposition: A | Discharge status: Home / Self Care | Payer: Medicare HMO | Source: Ambulatory Visit | Attending: Interventional Radiology | Admitting: Interventional Radiology

## 2023-05-23 ENCOUNTER — Telehealth: Payer: Self-pay | Admitting: Orthopedic Surgery

## 2023-05-23 ENCOUNTER — Other Ambulatory Visit: Payer: Self-pay | Admitting: Interventional Radiology

## 2023-05-23 DIAGNOSIS — M545 Low back pain, unspecified: Secondary | ICD-10-CM

## 2023-05-23 DIAGNOSIS — M47816 Spondylosis without myelopathy or radiculopathy, lumbar region: Secondary | ICD-10-CM | POA: Diagnosis not present

## 2023-05-23 DIAGNOSIS — M546 Pain in thoracic spine: Secondary | ICD-10-CM | POA: Diagnosis not present

## 2023-05-23 DIAGNOSIS — S22000A Wedge compression fracture of unspecified thoracic vertebra, initial encounter for closed fracture: Secondary | ICD-10-CM

## 2023-05-23 HISTORY — PX: IR RADIOLOGIST EVAL & MGMT: IMG5224

## 2023-05-23 MED ORDER — HYDROCODONE-ACETAMINOPHEN 5-325 MG PO TABS: 1.0000 | ORAL_TABLET | Freq: Three times a day (TID) | ORAL | 0 refills | Status: DC | PRN

## 2023-05-23 NOTE — Telephone Encounter (Signed)
I am sorry that she is hurting so much. Does she have any pain medication to take?   She was prescribed hydrocodone last month, did that help?   I can refill this medication if needed and see her next week if no improvement.

## 2023-05-23 NOTE — Addendum Note (Signed)
Addended byDrake Leach on: 05/23/2023 04:59 PM   Modules accepted: Orders

## 2023-05-23 NOTE — Telephone Encounter (Signed)
I spoke to [place] and he states she has been taking Hydrocodone and she only has 1 left because she has been spacing them out. He states he would like her to get a refill.

## 2023-05-23 NOTE — Telephone Encounter (Signed)
I notified Dannielle Huh of Stacy's recommendations

## 2023-05-23 NOTE — Telephone Encounter (Signed)
Patient's son, Dannielle Huh is calling to request an appointment ASAP with our office per the recommendation of Dr. Juliette Alcide at [facility]. The son states that Dr. Juliette Alcide told them at her appointment that everything looks good from a surgical standpoint, but the son states she is still in a lot of pain. Please advise.

## 2023-05-23 NOTE — Progress Notes (Signed)
IR progress note   The patient returns following recent T10 kyphoplasty on May 14, 2023.  The patient continues to report fairly significant lower back pain near or just below the level of her recent kyphoplasty.  The pain radiates laterally across her right lower ribcage/abdomen.    Due to her significant ongoing pain, repeat radiographs of the thoracic and lumbar spine were performed.  These demonstrated expected post vertebral body augmentation changes without new compression fracture identified.    Given the relative significant height loss of the treated fracture, I suspect that she may need additional time for healing given that the kyphoplasty remains less than 2 weeks ago.  She should continue her follow-up with our referring neurosurgical colleagues for overall spine evaluation given her coexisting degenerative disc disease.  We will plan to follow up in an additional 4 weeks and reassess the need for any additional imaging, such as a 3D bone scan, which may highlight a radiographically occult fracture since the patient can not obtain an MRI.    Emily Bass, MD  Vascular and Interventional Radiology 05/23/2023 3:12 PM

## 2023-05-23 NOTE — Telephone Encounter (Addendum)
Norco refill sent to pharmacy. Remind her this can make her sleepy and/or constipated and she should only take as needed for severe pain.   PMP reviewed and is appropriate.   Will call her Monday to check on her.   Also, remind her there is 325mg  of tylenol in each pain pill. She should not take over 3000-4000mg  of tylenol per day.

## 2023-05-24 ENCOUNTER — Other Ambulatory Visit: Payer: Self-pay | Admitting: Interventional Radiology

## 2023-05-24 ENCOUNTER — Other Ambulatory Visit: Payer: Medicare HMO

## 2023-05-24 ENCOUNTER — Inpatient Hospital Stay: Admission: RE | Admit: 2023-05-24 | Payer: Medicare HMO | Source: Ambulatory Visit

## 2023-05-24 ENCOUNTER — Encounter: Payer: Self-pay | Admitting: *Deleted

## 2023-05-24 DIAGNOSIS — S22000S Wedge compression fracture of unspecified thoracic vertebra, sequela: Secondary | ICD-10-CM

## 2023-05-24 NOTE — Progress Notes (Signed)
Breast biopsy has been canceled by patient/family due to ongoing back issues.   They are unsure if they will reschedule the appointment.

## 2023-05-27 ENCOUNTER — Telehealth: Payer: Self-pay | Admitting: Orthopedic Surgery

## 2023-05-27 NOTE — Telephone Encounter (Signed)
-----   Message from Literberry B sent at 05/27/2023  8:28 AM EST ----- I spoke with the patient and she states she thinks her back pain is a little better and will call our office back if she feels like she needs to be seen. ----- Message ----- From: Drake Leach, PA-C Sent: 05/27/2023   8:06 AM EST To: [facility]  Please call to check on her. She called last week with increased back pain. We sent her in some pain medication.   Is she doing better?   If not, please see if she can see me this week.   Thanks! ----- Message ----- From: Drake Leach, PA-C Sent: 05/27/2023  12:00 AM EST To: Drake Leach, PA-C  Call to check on her.

## 2023-05-28 ENCOUNTER — Emergency Department: Payer: Medicare HMO

## 2023-05-28 ENCOUNTER — Telehealth: Payer: Medicare HMO

## 2023-05-28 ENCOUNTER — Inpatient Hospital Stay: Payer: Medicare HMO

## 2023-05-28 ENCOUNTER — Other Ambulatory Visit: Payer: Self-pay

## 2023-05-28 ENCOUNTER — Observation Stay
Admission: EM | Admit: 2023-05-28 | Discharge: 2023-05-30 | Disposition: A | Discharge status: Home / Self Care | Payer: Medicare HMO | Attending: Internal Medicine | Admitting: Internal Medicine

## 2023-05-28 DIAGNOSIS — M545 Low back pain, unspecified: Secondary | ICD-10-CM | POA: Diagnosis not present

## 2023-05-28 DIAGNOSIS — R188 Other ascites: Secondary | ICD-10-CM | POA: Diagnosis not present

## 2023-05-28 DIAGNOSIS — E1169 Type 2 diabetes mellitus with other specified complication: Secondary | ICD-10-CM | POA: Diagnosis not present

## 2023-05-28 DIAGNOSIS — E785 Hyperlipidemia, unspecified: Secondary | ICD-10-CM | POA: Diagnosis not present

## 2023-05-28 DIAGNOSIS — R0902 Hypoxemia: Secondary | ICD-10-CM | POA: Diagnosis not present

## 2023-05-28 DIAGNOSIS — Z940 Kidney transplant status: Secondary | ICD-10-CM | POA: Insufficient documentation

## 2023-05-28 DIAGNOSIS — K1120 Sialoadenitis, unspecified: Secondary | ICD-10-CM | POA: Diagnosis not present

## 2023-05-28 DIAGNOSIS — K1121 Acute sialoadenitis: Principal | ICD-10-CM

## 2023-05-28 DIAGNOSIS — N1832 Chronic kidney disease, stage 3b: Secondary | ICD-10-CM | POA: Diagnosis not present

## 2023-05-28 DIAGNOSIS — Z79899 Other long term (current) drug therapy: Secondary | ICD-10-CM | POA: Insufficient documentation

## 2023-05-28 DIAGNOSIS — J849 Interstitial pulmonary disease, unspecified: Secondary | ICD-10-CM | POA: Diagnosis not present

## 2023-05-28 DIAGNOSIS — G8929 Other chronic pain: Secondary | ICD-10-CM

## 2023-05-28 DIAGNOSIS — K7689 Other specified diseases of liver: Secondary | ICD-10-CM | POA: Diagnosis not present

## 2023-05-28 DIAGNOSIS — J984 Other disorders of lung: Secondary | ICD-10-CM | POA: Diagnosis not present

## 2023-05-28 DIAGNOSIS — K7460 Unspecified cirrhosis of liver: Secondary | ICD-10-CM | POA: Diagnosis not present

## 2023-05-28 DIAGNOSIS — J8410 Pulmonary fibrosis, unspecified: Secondary | ICD-10-CM | POA: Diagnosis not present

## 2023-05-28 DIAGNOSIS — I129 Hypertensive chronic kidney disease with stage 1 through stage 4 chronic kidney disease, or unspecified chronic kidney disease: Secondary | ICD-10-CM | POA: Diagnosis not present

## 2023-05-28 DIAGNOSIS — Z8679 Personal history of other diseases of the circulatory system: Secondary | ICD-10-CM

## 2023-05-28 DIAGNOSIS — Z87891 Personal history of nicotine dependence: Secondary | ICD-10-CM | POA: Diagnosis not present

## 2023-05-28 DIAGNOSIS — I10 Essential (primary) hypertension: Secondary | ICD-10-CM | POA: Diagnosis not present

## 2023-05-28 DIAGNOSIS — M549 Dorsalgia, unspecified: Secondary | ICD-10-CM | POA: Diagnosis present

## 2023-05-28 DIAGNOSIS — D6949 Other primary thrombocytopenia: Secondary | ICD-10-CM | POA: Diagnosis not present

## 2023-05-28 DIAGNOSIS — N2889 Other specified disorders of kidney and ureter: Secondary | ICD-10-CM | POA: Diagnosis not present

## 2023-05-28 DIAGNOSIS — E1122 Type 2 diabetes mellitus with diabetic chronic kidney disease: Secondary | ICD-10-CM | POA: Insufficient documentation

## 2023-05-28 DIAGNOSIS — I959 Hypotension, unspecified: Secondary | ICD-10-CM | POA: Diagnosis not present

## 2023-05-28 DIAGNOSIS — R0689 Other abnormalities of breathing: Secondary | ICD-10-CM | POA: Diagnosis not present

## 2023-05-28 DIAGNOSIS — Z853 Personal history of malignant neoplasm of breast: Secondary | ICD-10-CM | POA: Insufficient documentation

## 2023-05-28 DIAGNOSIS — Z950 Presence of cardiac pacemaker: Secondary | ICD-10-CM

## 2023-05-28 DIAGNOSIS — E1165 Type 2 diabetes mellitus with hyperglycemia: Secondary | ICD-10-CM | POA: Diagnosis not present

## 2023-05-28 DIAGNOSIS — Z794 Long term (current) use of insulin: Secondary | ICD-10-CM | POA: Diagnosis not present

## 2023-05-28 DIAGNOSIS — M799 Soft tissue disorder, unspecified: Secondary | ICD-10-CM | POA: Diagnosis not present

## 2023-05-28 DIAGNOSIS — C50919 Malignant neoplasm of unspecified site of unspecified female breast: Secondary | ICD-10-CM

## 2023-05-28 DIAGNOSIS — Z9889 Other specified postprocedural states: Secondary | ICD-10-CM

## 2023-05-28 DIAGNOSIS — R609 Edema, unspecified: Secondary | ICD-10-CM | POA: Diagnosis not present

## 2023-05-28 DIAGNOSIS — R6884 Jaw pain: Secondary | ICD-10-CM | POA: Diagnosis present

## 2023-05-28 DIAGNOSIS — R220 Localized swelling, mass and lump, head: Secondary | ICD-10-CM | POA: Diagnosis not present

## 2023-05-28 DIAGNOSIS — R0602 Shortness of breath: Secondary | ICD-10-CM | POA: Diagnosis not present

## 2023-05-28 DIAGNOSIS — R161 Splenomegaly, not elsewhere classified: Secondary | ICD-10-CM | POA: Diagnosis not present

## 2023-05-28 DIAGNOSIS — D696 Thrombocytopenia, unspecified: Secondary | ICD-10-CM | POA: Insufficient documentation

## 2023-05-28 DIAGNOSIS — E119 Type 2 diabetes mellitus without complications: Secondary | ICD-10-CM

## 2023-05-28 LAB — CBC WITH DIFFERENTIAL/PLATELET
Abs Immature Granulocytes: 0.1 10*3/uL — ABNORMAL HIGH (ref 0.00–0.07)
Basophils Absolute: 0 10*3/uL (ref 0.0–0.1)
Basophils Relative: 0 %
Eosinophils Absolute: 0.1 10*3/uL (ref 0.0–0.5)
Eosinophils Relative: 1 %
HCT: 36 % (ref 36.0–46.0)
Hemoglobin: 11.9 g/dL — ABNORMAL LOW (ref 12.0–15.0)
Immature Granulocytes: 1 %
Lymphocytes Relative: 5 %
Lymphs Abs: 0.5 10*3/uL — ABNORMAL LOW (ref 0.7–4.0)
MCH: 32.5 pg (ref 26.0–34.0)
MCHC: 33.1 g/dL (ref 30.0–36.0)
MCV: 98.4 fL (ref 80.0–100.0)
Monocytes Absolute: 0.8 10*3/uL (ref 0.1–1.0)
Monocytes Relative: 7 %
Neutro Abs: 9.3 10*3/uL — ABNORMAL HIGH (ref 1.7–7.7)
Neutrophils Relative %: 86 %
Platelets: 100 10*3/uL — ABNORMAL LOW (ref 150–400)
RBC: 3.66 MIL/uL — ABNORMAL LOW (ref 3.87–5.11)
RDW: 15.1 % (ref 11.5–15.5)
WBC: 10.8 10*3/uL — ABNORMAL HIGH (ref 4.0–10.5)
nRBC: 0 % (ref 0.0–0.2)

## 2023-05-28 LAB — COMPREHENSIVE METABOLIC PANEL
ALT: 16 U/L (ref 0–44)
AST: 28 U/L (ref 15–41)
Albumin: 3.5 g/dL (ref 3.5–5.0)
Alkaline Phosphatase: 76 U/L (ref 38–126)
Anion gap: 7 (ref 5–15)
BUN: 17 mg/dL (ref 8–23)
CO2: 23 mmol/L (ref 22–32)
Calcium: 9.4 mg/dL (ref 8.9–10.3)
Chloride: 104 mmol/L (ref 98–111)
Creatinine, Ser: 1.17 mg/dL — ABNORMAL HIGH (ref 0.44–1.00)
GFR, Estimated: 47 mL/min — ABNORMAL LOW (ref >60)
Glucose, Bld: 184 mg/dL — ABNORMAL HIGH (ref 70–99)
Potassium: 3.6 mmol/L (ref 3.5–5.1)
Sodium: 134 mmol/L — ABNORMAL LOW (ref 135–145)
Total Bilirubin: 2.2 mg/dL — ABNORMAL HIGH (ref <1.2)
Total Protein: 7 g/dL (ref 6.5–8.1)

## 2023-05-28 LAB — GLUCOSE, CAPILLARY
Glucose-Capillary: 216 mg/dL — ABNORMAL HIGH (ref 70–99)
Glucose-Capillary: 255 mg/dL — ABNORMAL HIGH (ref 70–99)
Glucose-Capillary: 324 mg/dL — ABNORMAL HIGH (ref 70–99)
Glucose-Capillary: 335 mg/dL — ABNORMAL HIGH (ref 70–99)

## 2023-05-28 LAB — PROCALCITONIN: Procalcitonin: 1.78 ng/mL

## 2023-05-28 LAB — MRSA NEXT GEN BY PCR, NASAL: MRSA by PCR Next Gen: NOT DETECTED

## 2023-05-28 LAB — LACTIC ACID, PLASMA: Lactic Acid, Venous: 1.9 mmol/L (ref 0.5–1.9)

## 2023-05-28 MED ORDER — IOHEXOL 300 MG/ML  SOLN
75.0000 mL | Freq: Once | INTRAMUSCULAR | Status: AC | PRN
  Administered 2023-05-28: 75 mL via INTRAVENOUS

## 2023-05-28 MED ORDER — HYDROMORPHONE HCL 1 MG/ML IJ SOLN: 0.5000 mg | INTRAMUSCULAR | Status: DC | PRN

## 2023-05-28 MED ORDER — ENOXAPARIN SODIUM 40 MG/0.4ML IJ SOSY: 40.0000 mg | PREFILLED_SYRINGE | INTRAMUSCULAR | Status: DC

## 2023-05-28 MED ORDER — SODIUM CHLORIDE 0.9 % IV SOLN: INTRAVENOUS | Status: DC

## 2023-05-28 MED ORDER — SODIUM CHLORIDE 0.9 % IV SOLN: 3.0000 g | Freq: Once | INTRAVENOUS | Status: DC

## 2023-05-28 MED ORDER — SIMVASTATIN 20 MG PO TABS
20.0000 mg | ORAL_TABLET | Freq: Every day | ORAL | Status: DC
  Administered 2023-05-28 – 2023-05-29 (×2): 20 mg via ORAL
  Filled 2023-05-28 (×2): qty 1

## 2023-05-28 MED ORDER — SODIUM CHLORIDE 0.9 % IV BOLUS (SEPSIS)
1000.0000 mL | Freq: Once | INTRAVENOUS | Status: AC
Start: 2023-05-28 — End: 2023-05-28
  Administered 2023-05-28: 1000 mL via INTRAVENOUS

## 2023-05-28 MED ORDER — INSULIN ASPART 100 UNIT/ML IJ SOLN
0.0000 [IU] | Freq: Three times a day (TID) | INTRAMUSCULAR | Status: DC
  Administered 2023-05-28: 5 [IU] via SUBCUTANEOUS
  Administered 2023-05-28 – 2023-05-29 (×4): 3 [IU] via SUBCUTANEOUS
  Administered 2023-05-30: 1 [IU] via SUBCUTANEOUS
  Administered 2023-05-30: 2 [IU] via SUBCUTANEOUS
  Filled 2023-05-28 (×7): qty 1

## 2023-05-28 MED ORDER — ONDANSETRON HCL 4 MG/2ML IJ SOLN
4.0000 mg | Freq: Once | INTRAMUSCULAR | Status: AC
  Administered 2023-05-28: 4 mg via INTRAVENOUS
  Filled 2023-05-28: qty 2

## 2023-05-28 MED ORDER — HYDROCODONE-ACETAMINOPHEN 5-325 MG PO TABS
1.0000 | ORAL_TABLET | Freq: Three times a day (TID) | ORAL | Status: DC | PRN
  Administered 2023-05-29: 1 via ORAL
  Filled 2023-05-28 (×2): qty 1

## 2023-05-28 MED ORDER — PANTOPRAZOLE SODIUM 40 MG PO TBEC
40.0000 mg | DELAYED_RELEASE_TABLET | Freq: Every day | ORAL | Status: DC
  Administered 2023-05-28 – 2023-05-30 (×3): 40 mg via ORAL
  Filled 2023-05-28 (×3): qty 1

## 2023-05-28 MED ORDER — SODIUM CHLORIDE 0.9 % IV SOLN
3.0000 g | Freq: Four times a day (QID) | INTRAVENOUS | Status: DC
  Administered 2023-05-28 – 2023-05-30 (×8): 3 g via INTRAVENOUS
  Filled 2023-05-28 (×10): qty 8

## 2023-05-28 MED ORDER — ONDANSETRON HCL 4 MG/2ML IJ SOLN: 4.0000 mg | Freq: Four times a day (QID) | INTRAMUSCULAR | Status: DC | PRN

## 2023-05-28 MED ORDER — INSULIN GLARGINE-YFGN 100 UNIT/ML ~~LOC~~ SOLN
10.0000 [IU] | Freq: Every day | SUBCUTANEOUS | Status: DC
  Administered 2023-05-28 – 2023-05-29 (×2): 10 [IU] via SUBCUTANEOUS
  Filled 2023-05-28 (×2): qty 0.1

## 2023-05-28 MED ORDER — FENTANYL CITRATE PF 50 MCG/ML IJ SOSY
50.0000 ug | PREFILLED_SYRINGE | Freq: Once | INTRAMUSCULAR | Status: AC
  Administered 2023-05-28: 50 ug via INTRAVENOUS
  Filled 2023-05-28: qty 1

## 2023-05-28 MED ORDER — CLINDAMYCIN PHOSPHATE 600 MG/50ML IV SOLN: 600.0000 mg | Freq: Two times a day (BID) | INTRAVENOUS | Status: DC

## 2023-05-28 MED ORDER — DEXAMETHASONE SODIUM PHOSPHATE 10 MG/ML IJ SOLN
10.0000 mg | Freq: Three times a day (TID) | INTRAMUSCULAR | Status: AC
  Administered 2023-05-28 (×3): 10 mg via INTRAVENOUS
  Filled 2023-05-28 (×3): qty 1

## 2023-05-28 MED ORDER — ONDANSETRON HCL 4 MG PO TABS: 4.0000 mg | ORAL_TABLET | Freq: Four times a day (QID) | ORAL | Status: DC | PRN

## 2023-05-28 MED ORDER — AMOXICILLIN-POT CLAVULANATE 875-125 MG PO TABS: 1.0000 | ORAL_TABLET | Freq: Once | ORAL | Status: DC

## 2023-05-28 NOTE — ED Notes (Signed)
Ultrasound at bedside

## 2023-05-28 NOTE — Assessment & Plan Note (Signed)
BP stable Titrate home regimen 

## 2023-05-28 NOTE — ED Notes (Signed)
Patient transported to the Room 222 be EDT [facility]

## 2023-05-28 NOTE — Care Management Obs Status (Signed)
MEDICARE OBSERVATION STATUS NOTIFICATION   Patient Details  Name: Emily Faulkner MRN: [id number] Date of Birth: 1941/05/07   Medicare Observation Status Notification Given:  Yes    Marquita Palms, LCSW 05/28/2023, 9:50 AM

## 2023-05-28 NOTE — Care Management CC44 (Signed)
Condition Code 44 Documentation Completed  Patient Details  Name: AVIELLE RYDMAN MRN: [id number] Date of Birth: [birth year]   Condition Code 44 given:  Yes Patient signature on Condition Code 44 notice:  Yes Documentation of 2 MD's agreement:  Yes Code 44 added to claim:  Yes    Marquita Palms, LCSW 05/28/2023, 9:50 AM

## 2023-05-28 NOTE — Assessment & Plan Note (Signed)
Cr 1.17 w/ GFR in the 40s  At baseline  Monitor

## 2023-05-28 NOTE — Discharge Instructions (Signed)

## 2023-05-28 NOTE — Assessment & Plan Note (Signed)
Blood sugar in 180s  SSI  Long acting insulin  Monitor

## 2023-05-28 NOTE — TOC Initial Note (Signed)
Transition of Care [facility]) - Initial/Assessment Note    Patient Details  Name: Emily Faulkner MRN: [id number] Date of Birth: 07-22-1940  Transition of Care [facility]) CM/SW Contact:    Marquita Palms, LCSW Phone Number: 05/28/2023, 9:53 AM  Clinical Narrative:                  CSW met with patient and son bedside. Patient given code 66 for change in status. Patient's son reports they will follow MD instructions upon discharge. Patient being admitted.        Patient Goals and CMS Choice            Expected Discharge Plan and Services                                              Prior Living Arrangements/Services                       Activities of Daily Living   ADL Screening (condition at time of admission) Independently performs ADLs?: Yes (appropriate for developmental age) Is the patient deaf or have difficulty hearing?: No Does the patient have difficulty seeing, even when wearing glasses/contacts?: No Does the patient have difficulty concentrating, remembering, or making decisions?: No  Permission Sought/Granted                  Emotional Assessment              Admission diagnosis:  Parotiditis [K11.20] Patient Active Problem List   Diagnosis Date Noted   Parotiditis 05/28/2023   Hyperbilirubinemia 05/28/2023   Thrombocytopenia (HCC) 05/28/2023   Mass of lower outer quadrant of left breast 04/10/2023   Closed wedge compression fracture of T10 vertebra (HCC) 04/09/2023   Intractable back pain 04/08/2023   Breast cancer (HCC) 04/08/2023   Renal mass, left 08/31/2022   Renal cancer (HCC) 08/31/2022   Acquired arteriovenous malformation of small intestine    Coronary artery disease involving native coronary artery of native heart 11/10/2020   Symptomatic bradycardia 11/10/2020   Stage 3b chronic kidney disease (HCC) 11/10/2020   Closed fracture of distal end of right fibula 10/28/2020   Complete heart block (HCC)  10/24/2020   Type 2 diabetes mellitus with stage 3 chronic kidney disease, with long-term current use of insulin (HCC) 09/16/2018   Age-related osteoporosis without current pathological fracture 05/09/2018   Renal mass 03/17/2018   Microalbuminuric diabetic nephropathy (HCC) 11/02/2016   Pulmonary hypertension (HCC) 09/11/2016   Hypertension 09/06/2016   High cholesterol 09/06/2016   Iron deficiency anemia 04/15/2016   Type 2 diabetes mellitus (HCC) 08/04/2014   Hyperlipidemia, unspecified 01/22/2014   Personal history of breast cancer 07/17/2012   PCP:  Marina Goodell, MD Pharmacy:   [facility] [address] [place] - [address] [place] [address] Phone: [phone] Fax: [phone]     Social Determinants of Health (SDOH) Social History: SDOH Screenings   Food Insecurity: No Food Insecurity (05/28/2023)  Housing: Low Risk  (05/28/2023)  Transportation Needs: No Transportation Needs (05/28/2023)  Utilities: Not At Risk (05/28/2023)  Depression (PHQ2-9): Low Risk  (02/05/2022)  Financial Resource Strain: Low Risk  (03/27/2023)   Received from [facility]  Tobacco Use: Medium Risk (05/02/2023)   SDOH Interventions:     Readmission Risk Interventions  10/28/2020    4:00 PM  Readmission Risk Prevention Plan  Transportation Screening Complete  PCP or Specialist Appt within 3-5 Days Complete  HRI or Home Care Consult Complete  Social Work Consult for Recovery Care Planning/Counseling Complete  Palliative Care Screening Not Applicable  Medication Review Oceanographer) Complete

## 2023-05-28 NOTE — Assessment & Plan Note (Signed)
S/p T10 kyphoplasty on May 14, 2023 w/ NSG Appears near baseline  Cont outpt pain regimen  Follow

## 2023-05-28 NOTE — Assessment & Plan Note (Signed)
Followed by Dr. Smith Robert outpt

## 2023-05-28 NOTE — Plan of Care (Signed)
  Problem: Clinical Measurements: Goal: Ability to maintain clinical measurements within normal limits will improve Outcome: Progressing Goal: Will remain free from infection Outcome: Progressing Goal: Diagnostic test results will improve Outcome: Progressing Goal: Respiratory complications will improve Outcome: Progressing Goal: Cardiovascular complication will be avoided Outcome: Progressing   Problem: Elimination: Goal: Will not experience complications related to bowel motility Outcome: Progressing Goal: Will not experience complications related to urinary retention Outcome: Progressing   Problem: Pain Management: Goal: General experience of comfort will improve Outcome: Progressing   Problem: Safety: Goal: Ability to remain free from injury will improve Outcome: Progressing

## 2023-05-28 NOTE — Assessment & Plan Note (Addendum)
Worst L sided jaw and facial swelling w/ noted parotiditis on maxiofacial CT  Tmax 99.7, WBC 11  Marked pain  Will place on IV unasyn for infectious coverage  Pain control  Case discussed w/ Dr. Jenne Campus  Add on IV decadron  Consider addition of clindamycin vs. Bactrim for MRSA coverage (will check MRSA swab)  D/c on prednisone and augmentin vs. Clindamycin

## 2023-05-28 NOTE — ED Notes (Signed)
Informed RN bed assigned 

## 2023-05-28 NOTE — Assessment & Plan Note (Signed)
Plt 100 today  Looks to be fairly chronic  Monitor

## 2023-05-28 NOTE — H&P (Addendum)
History and Physical    Patient: Emily Faulkner ZOX:[id number] DOB: [birth year] DOA: 05/28/2023 DOS: the patient was seen and examined on 05/28/2023 PCP: Marina Goodell, MD  Patient coming from: Home  Chief Complaint:  Chief Complaint  Patient presents with   Jaw Pain   HPI: Emily Faulkner is a 82 y.o. female with medical history significant of CKD, complete heart block status post permanent pacemaker, osteoporosis, hypertension, hyperlipidemia, renal cancer status post ablation, vertebral fracture s/p kyphoplasty, chronic pain presenting w/ L sided jaw pain and parotiditis.  Patient reports worsening left-sided jaw pain over the past 3 to 4 days.  Progressive swelling.  Mild drainage.  Mild pain with swallowing.  Minimal to mild trismus.  Positive malaise.  Has been able to swallow.  Albeit with decreased p.o. intake since onset of symptoms.  Patient reports similar episode of right-sided parotitis roughly 2 months ago that was treated with outpatient antibiotics.  No chest pain or shortness of breath.  No nausea or vomiting.  Non-smoker.  Denies any active alcohol use. Presented to ER Tmax 99.7, hemodynamically stable.  White count 10.8, hemoglobin 11.9, platelets 100, creatinine 1.17, glucose 184.  T. bili 2.2.  Maxillofacial CT with left-sided parotitis. Review of Systems: As mentioned in the history of present illness. All other systems reviewed and are negative. Past Medical History:  Diagnosis Date   Acute kidney failure (HCC) 11/16/2020   AKI (acute kidney injury) (HCC) 03/17/2018   Anemia    Cancer (HCC)    breast cancer right mastectomy   Diabetes mellitus without complication (HCC)    GIB (gastrointestinal bleeding) 02/20/2018   Glaucoma    Hyperkalemia 10/24/2020   Hyperlipidemia    Hypertension    Incontinence of bowel    Neuropathy    Odynophagia 10/24/2020   Osteoarthritis    Presence of permanent cardiac pacemaker    Rheumatoid arthritis (HCC)    Syncope and  collapse 10/24/2020   Telangiectasias    Past Surgical History:  Procedure Laterality Date   BREAST SURGERY     right mastectomy   CHOLECYSTECTOMY     COLONOSCOPY     COLONOSCOPY WITH PROPOFOL N/A 08/14/2016   Procedure: COLONOSCOPY WITH PROPOFOL;  Surgeon: Christena Deem, MD;  Location: [facility] ENDOSCOPY;  Service: Endoscopy;  Laterality: N/A;   COLONOSCOPY WITH PROPOFOL N/A 02/22/2018   Procedure: COLONOSCOPY WITH PROPOFOL;  Surgeon: Wyline Mood, MD;  Location: [facility] ENDOSCOPY;  Service: Gastroenterology;  Laterality: N/A;   ENTEROSCOPY N/A 04/12/2021   Procedure: ENTEROSCOPY;  Surgeon: Toney Reil, MD;  Location: [facility] ENDOSCOPY;  Service: Gastroenterology;  Laterality: N/A;  push   ESOPHAGOGASTRODUODENOSCOPY (EGD) WITH PROPOFOL N/A 08/14/2016   Procedure: ESOPHAGOGASTRODUODENOSCOPY (EGD) WITH PROPOFOL;  Surgeon: Christena Deem, MD;  Location: [facility] ENDOSCOPY;  Service: Endoscopy;  Laterality: N/A;   ESOPHAGOGASTRODUODENOSCOPY (EGD) WITH PROPOFOL N/A 01/30/2018   Procedure: ESOPHAGOGASTRODUODENOSCOPY (EGD) WITH PROPOFOL;  Surgeon: Toledo, Boykin Nearing, MD;  Location: [facility] ENDOSCOPY;  Service: Gastroenterology;  Laterality: N/A;   ESOPHAGOGASTRODUODENOSCOPY (EGD) WITH PROPOFOL N/A 02/22/2018   Procedure: ESOPHAGOGASTRODUODENOSCOPY (EGD) WITH PROPOFOL;  Surgeon: Wyline Mood, MD;  Location: [facility] ENDOSCOPY;  Service: Gastroenterology;  Laterality: N/A;   EXCISIONAL HEMORRHOIDECTOMY     IR KYPHO THORACIC WITH BONE BIOPSY  05/14/2023   IR RADIOLOGIST EVAL & MGMT  08/07/2022   IR RADIOLOGIST EVAL & MGMT  09/12/2022   IR RADIOLOGIST EVAL & MGMT  05/02/2023   IR RADIOLOGIST EVAL & MGMT  05/02/2023   IR RADIOLOGIST EVAL &  MGMT  05/23/2023   KYPHOPLASTY N/A 03/20/2018   Procedure: [id number];  Surgeon: Kennedy Bucker, MD;  Location: [facility] ORS;  Service: Orthopedics;  Laterality: N/A;   PACEMAKER IMPLANT N/A 10/26/2020   Procedure: PACEMAKER IMPLANT;  Surgeon: Marcina Millard, MD;  Location:  [facility] INVASIVE CV LAB;  Service: Cardiovascular;  Laterality: N/A;   RADIOLOGY WITH ANESTHESIA N/A 08/31/2022   Procedure: CT renal microwave ablation;  Surgeon: Pernell Dupre, MD;  Location: [facility] OR;  Service: Radiology;  Laterality: N/A;   Social History:  reports that she quit smoking about 11 years ago. Her smoking use included cigarettes. She has never used smokeless tobacco. She reports that she does not drink alcohol and does not use drugs.  No Known Allergies  Family History  Problem Relation Age of Onset   Kidney cancer Son    Bladder Cancer Neg Hx    Breast cancer Neg Hx     Prior to Admission medications   Medication Sig Start Date End Date Taking? Authorizing Provider  ACCU-CHEK AVIVA PLUS test strip  03/28/18   [provider]  ACCU-CHEK SOFTCLIX LANCETS lancets once daily Use as instructed. 04/14/18   [provider]  acetaminophen (TYLENOL) 500 MG tablet Take 500 mg by mouth every 8 (eight) hours as needed for moderate pain or mild pain. Patient not taking: Reported on 05/02/2023    [provider]  alendronate (FOSAMAX) 70 MG tablet TAKE 1 TABLET BY MOUTH EVERY 7DAYS WITH A FULL GLASS OF WATER. DO NOT LIE DOWN FOR 30 MINUTES 03/21/21   [provider]  Biotin 1000 MCG tablet Take 1,000 mcg by mouth daily.    [provider]  Calcium Carb-Cholecalciferol (OYSTER SHELL CALCIUM W/D) 500-5 MG-MCG TABS Take 1 tablet by mouth daily.    [provider]  Cholecalciferol 50 MCG (2000 UT) CAPS Take 2,000 Units by mouth daily.    [provider]  feeding supplement, ENSURE ENLIVE, (ENSURE ENLIVE) LIQD Take 237 mLs by mouth 2 (two) times daily between meals. 02/26/18   Enedina Finner, MD  HYDROcodone-acetaminophen (NORCO) 5-325 MG tablet Take 1 tablet by mouth every 8 (eight) hours as needed for severe pain (pain score 7-10). 05/23/23   Drake Leach, PA-C  insulin glargine (LANTUS) 100 UNIT/ML Solostar Pen Inject 15 Units into the  skin daily. (may take up to 50u based upon blood glucose reading) 04/11/23   Enedina Finner, MD  Insulin Pen Needle 32G X 4 MM MISC USE 1 NEEDLE SUBCUTANEOUSLY ONCE A DAY AS DIRECTED 03/24/18   [provider]  losartan (COZAAR) 50 MG tablet Take 25 mg by mouth daily. 07/28/22   [provider]  Magnesium 100 MG CAPS Take 2 capsules by mouth daily. Glycinate    [provider]  niacin 500 MG CR capsule Take 500 mg by mouth at bedtime.    [provider]  omeprazole (PRILOSEC) 40 MG capsule TAKE 1 CAPSULE BY MOUTH EVERY DAY BEFORE BREAKFAST 04/29/23   Vanga, Loel Dubonnet, MD  Probiotic Product (PROBIOTIC PO) Take 1 tablet by mouth daily. 2 billion    [provider]  simvastatin (ZOCOR) 20 MG tablet Take 20 mg by mouth at bedtime. 12/23/17   [provider]  sitaGLIPtin (JANUVIA) 50 MG tablet Take 50 mg by mouth daily.    [provider]  TRAVATAN Z 0.004 % SOLN ophthalmic solution Place 1 drop into both eyes at bedtime. 12/05/17   [provider]  TURMERIC PO Take 538 mg  by mouth daily.    [provider]  vitamin B-12 (CYANOCOBALAMIN) 1000 MCG tablet Take 2,000 mcg by mouth daily.    [provider]    Physical Exam: Vitals:   05/28/23 0451 05/28/23 0455  BP: (!) 142/52   Pulse: 98   Resp: 19   Temp: 99.7 F (37.6 C)   TempSrc: Oral   SpO2: 99%   Weight:  67.1 kg  Height:  5\' 4"  (1.626 m)   Physical Exam Constitutional:      Comments: Underweight    HENT:     Head: Normocephalic.     Comments: L sided jaw swelling  See picture       Nose: Nose normal.     Mouth/Throat:     Mouth: Mucous membranes are dry.  Eyes:     Pupils: Pupils are equal, round, and reactive to light.  Cardiovascular:     Rate and Rhythm: Normal rate and regular rhythm.  Pulmonary:     Effort: Pulmonary effort is normal.  Abdominal:     General: Bowel sounds are normal.  Musculoskeletal:        General: Normal range  of motion.  Skin:    General: Skin is warm.  Neurological:     General: No focal deficit present.  Psychiatric:        Mood and Affect: Mood normal.     Data Reviewed:  There are no new results to review at this time.  DG Chest Port 1 View CLINICAL DATA:  Hypoxia with shortness of breath  EXAM: PORTABLE CHEST 1 VIEW  COMPARISON:  10/25/2020  FINDINGS: Cardiomegaly with dual-chamber pacer in unremarkable position.  Generalized interstitial coarsening which was also seen on prior. There is interstitial lung disease by recent chest CT. Right mastectomy and axillary dissection. Normal aortic contours. Stable borderline heart size.  IMPRESSION: Chronic lung disease without acute superimposed finding when compared to priors.  Electronically Signed   By: Tiburcio Pea M.D.   On: 05/28/2023 07:35 CT Maxillofacial W Contrast CLINICAL DATA:  Left facial swelling  EXAM: CT MAXILLOFACIAL WITH CONTRAST  TECHNIQUE: Multidetector CT imaging of the maxillofacial structures was performed with intravenous contrast. Multiplanar CT image reconstructions were also generated.  RADIATION DOSE REDUCTION: This exam was performed according to the departmental dose-optimization program which includes automated exposure control, adjustment of the mA and/or kV according to patient size and/or use of iterative reconstruction technique.  CONTRAST:  75mL OMNIPAQUE IOHEXOL 300 MG/ML  SOLN  COMPARISON:  None Available.  FINDINGS: Osseous: No evidence of fracture or bone lesion. Patient is edentulous with alveolar atrophy. Advanced cervical spine degeneration.  Orbits: Bilateral cataract resection.  Sinuses: Negative for sinusitis.  Soft tissues: Heterogeneous parotid is with expansion on the left. There is some overlying skin thickening and subcutaneous reticulation as well. No underlying stone detected. The mucosa of the supraglottic larynx appears somewhat redundant but no  convincing edema and no epiglottic thickening.  Limited intracranial: Negative  IMPRESSION: Left parotiditis.  No underlying stone.  Electronically Signed   By: Tiburcio Pea M.D.   On: 05/28/2023 06:28  Lab Results  Component Value Date   WBC 10.8 (H) 05/28/2023   HGB 11.9 (L) 05/28/2023   HCT 36.0 05/28/2023   MCV 98.4 05/28/2023   PLT 100 (L) 05/28/2023   Last metabolic panel Lab Results  Component Value Date   GLUCOSE 184 (H) 05/28/2023   NA 134 (L) 05/28/2023   K 3.6 05/28/2023   CL  104 05/28/2023   CO2 23 05/28/2023   BUN 17 05/28/2023   CREATININE 1.17 (H) 05/28/2023   GFRNONAA 47 (L) 05/28/2023   CALCIUM 9.4 05/28/2023   PHOS 3.8 10/28/2020   PROT 7.0 05/28/2023   ALBUMIN 3.5 05/28/2023   LABGLOB 3.0 03/01/2021   AGRATIO 1.4 01/28/2019   BILITOT 2.2 (H) 05/28/2023   ALKPHOS 76 05/28/2023   AST 28 05/28/2023   ALT 16 05/28/2023   ANIONGAP 7 05/28/2023    Assessment and Plan: * Parotiditis Worst L sided jaw and facial swelling w/ noted parotiditis on maxiofacial CT  Tmax 99.7, WBC 11  Marked pain  Will place on IV unasyn for infectious coverage  Pain control  Case discussed w/ Dr. Jenne Campus  Add on IV decadron  Consider addition of clindamycin vs. Bactrim for MRSA coverage (will check MRSA swab)  D/c on prednisone and augmentin vs. Clindamycin    Intractable back pain S/p T10 kyphoplasty on May 14, 2023 w/ NSG Appears near baseline  Cont outpt pain regimen  Follow    Stage 3b chronic kidney disease (HCC) Cr 1.17 w/ GFR in the 40s  At baseline  Monitor    Thrombocytopenia (HCC) Plt 100 today  Looks to be fairly chronic  Monitor    Hyperbilirubinemia T. bili 2.2 on presentation LFT stable Minimal active abdominal pain Check right upper quadrant ultrasound to correlate Monitor  Renal mass Status post ablative therapy March 2024   Hyperlipidemia, unspecified Cont statin    Hypertension BP stable  Titrate home regimen     Personal history of breast cancer Followed by Dr. Smith Robert outpt   Type 2 diabetes mellitus (HCC) Blood sugar in 180s  SSI  Long acting insulin  Monitor       Advance Care Planning:   Code Status: Full Code   Consults: ENT   Family Communication:Family at the bedside   Severity of Illness: The appropriate patient status for this patient is OBSERVATION. Observation status is judged to be reasonable and necessary in order to provide the required intensity of service to ensure the patient's safety. The patient's presenting symptoms, physical exam findings, and initial radiographic and laboratory data in the context of their medical condition is felt to place them at decreased risk for further clinical deterioration. Furthermore, it is anticipated that the patient will be medically stable for discharge from the hospital within 2 midnights of admission.   Author: Floydene Flock, MD 05/28/2023 7:54 AM  For on call review [email or web address].

## 2023-05-28 NOTE — Assessment & Plan Note (Signed)
Cont statin

## 2023-05-28 NOTE — ED Provider Notes (Signed)
[facility] Provider Note    Event Date/Time   First MD Initiated Contact with Patient 05/28/23 [phone]     (approximate)   History   Jaw Pain   HPI  SEDA TISCHNER is a 82 y.o. female with history of hypertension, diabetes, hyperlipidemia, rheumatoid arthritis, bradycardia status post pacemaker, left breast mass currently being worked up for breast cancer, previous history of breast cancer status post right sided mastectomy who presents to the emergency department with complaints of left-sided facial swelling for the past 3 to 4 days.  No injury.  Reports low-grade temperature at home of 99.5.  Has had parotitis before on the right side that resolved after antibiotics but states this feels worse.  No difficulty swallowing, speaking or breathing.   History provided by patient, EMS.    Past Medical History:  Diagnosis Date   Acute kidney failure (HCC) 11/16/2020   AKI (acute kidney injury) (HCC) 03/17/2018   Anemia    Cancer (HCC)    breast cancer right mastectomy   Diabetes mellitus without complication (HCC)    GIB (gastrointestinal bleeding) 02/20/2018   Glaucoma    Hyperkalemia 10/24/2020   Hyperlipidemia    Hypertension    Incontinence of bowel    Neuropathy    Odynophagia 10/24/2020   Osteoarthritis    Presence of permanent cardiac pacemaker    Rheumatoid arthritis (HCC)    Syncope and collapse 10/24/2020   Telangiectasias     Past Surgical History:  Procedure Laterality Date   BREAST SURGERY     right mastectomy   CHOLECYSTECTOMY     COLONOSCOPY     COLONOSCOPY WITH PROPOFOL N/A 08/14/2016   Procedure: COLONOSCOPY WITH PROPOFOL;  Surgeon: Christena Deem, MD;  Location: [facility] ENDOSCOPY;  Service: Endoscopy;  Laterality: N/A;   COLONOSCOPY WITH PROPOFOL N/A 02/22/2018   Procedure: COLONOSCOPY WITH PROPOFOL;  Surgeon: Wyline Mood, MD;  Location: [facility] ENDOSCOPY;  Service: Gastroenterology;  Laterality: N/A;   ENTEROSCOPY N/A 04/12/2021    Procedure: ENTEROSCOPY;  Surgeon: Toney Reil, MD;  Location: [facility] ENDOSCOPY;  Service: Gastroenterology;  Laterality: N/A;  push   ESOPHAGOGASTRODUODENOSCOPY (EGD) WITH PROPOFOL N/A 08/14/2016   Procedure: ESOPHAGOGASTRODUODENOSCOPY (EGD) WITH PROPOFOL;  Surgeon: Christena Deem, MD;  Location: [facility] ENDOSCOPY;  Service: Endoscopy;  Laterality: N/A;   ESOPHAGOGASTRODUODENOSCOPY (EGD) WITH PROPOFOL N/A 01/30/2018   Procedure: ESOPHAGOGASTRODUODENOSCOPY (EGD) WITH PROPOFOL;  Surgeon: Toledo, Boykin Nearing, MD;  Location: [facility] ENDOSCOPY;  Service: Gastroenterology;  Laterality: N/A;   ESOPHAGOGASTRODUODENOSCOPY (EGD) WITH PROPOFOL N/A 02/22/2018   Procedure: ESOPHAGOGASTRODUODENOSCOPY (EGD) WITH PROPOFOL;  Surgeon: Wyline Mood, MD;  Location: [facility] ENDOSCOPY;  Service: Gastroenterology;  Laterality: N/A;   EXCISIONAL HEMORRHOIDECTOMY     IR KYPHO THORACIC WITH BONE BIOPSY  05/14/2023   IR RADIOLOGIST EVAL & MGMT  08/07/2022   IR RADIOLOGIST EVAL & MGMT  09/12/2022   IR RADIOLOGIST EVAL & MGMT  05/02/2023   IR RADIOLOGIST EVAL & MGMT  05/02/2023   IR RADIOLOGIST EVAL & MGMT  05/23/2023   KYPHOPLASTY N/A 03/20/2018   Procedure: [id number];  Surgeon: Kennedy Bucker, MD;  Location: [facility] ORS;  Service: Orthopedics;  Laterality: N/A;   PACEMAKER IMPLANT N/A 10/26/2020   Procedure: PACEMAKER IMPLANT;  Surgeon: Marcina Millard, MD;  Location: [facility] INVASIVE CV LAB;  Service: Cardiovascular;  Laterality: N/A;   RADIOLOGY WITH ANESTHESIA N/A 08/31/2022   Procedure: CT renal microwave ablation;  Surgeon: Pernell Dupre, MD;  Location: [facility] OR;  Service: Radiology;  Laterality:  N/A;    MEDICATIONS:  Prior to Admission medications   Medication Sig Start Date End Date Taking? Authorizing Provider  ACCU-CHEK AVIVA PLUS test strip  03/28/18   [provider]  ACCU-CHEK SOFTCLIX LANCETS lancets once daily Use as instructed. 04/14/18   [provider]  acetaminophen (TYLENOL) 500 MG tablet  Take 500 mg by mouth every 8 (eight) hours as needed for moderate pain or mild pain. Patient not taking: Reported on 05/02/2023    [provider]  alendronate (FOSAMAX) 70 MG tablet TAKE 1 TABLET BY MOUTH EVERY 7DAYS WITH A FULL GLASS OF WATER. DO NOT LIE DOWN FOR 30 MINUTES 03/21/21   [provider]  Biotin 1000 MCG tablet Take 1,000 mcg by mouth daily.    [provider]  Calcium Carb-Cholecalciferol (OYSTER SHELL CALCIUM W/D) 500-5 MG-MCG TABS Take 1 tablet by mouth daily.    [provider]  Cholecalciferol 50 MCG (2000 UT) CAPS Take 2,000 Units by mouth daily.    [provider]  feeding supplement, ENSURE ENLIVE, (ENSURE ENLIVE) LIQD Take 237 mLs by mouth 2 (two) times daily between meals. 02/26/18   Enedina Finner, MD  HYDROcodone-acetaminophen (NORCO) 5-325 MG tablet Take 1 tablet by mouth every 8 (eight) hours as needed for severe pain (pain score 7-10). 05/23/23   Drake Leach, PA-C  insulin glargine (LANTUS) 100 UNIT/ML Solostar Pen Inject 15 Units into the skin daily. (may take up to 50u based upon blood glucose reading) 04/11/23   Enedina Finner, MD  Insulin Pen Needle 32G X 4 MM MISC USE 1 NEEDLE SUBCUTANEOUSLY ONCE A DAY AS DIRECTED 03/24/18   [provider]  losartan (COZAAR) 50 MG tablet Take 25 mg by mouth daily. 07/28/22   [provider]  Magnesium 100 MG CAPS Take 2 capsules by mouth daily. Glycinate    [provider]  niacin 500 MG CR capsule Take 500 mg by mouth at bedtime.    [provider]  omeprazole (PRILOSEC) 40 MG capsule TAKE 1 CAPSULE BY MOUTH EVERY DAY BEFORE BREAKFAST 04/29/23   Vanga, Loel Dubonnet, MD  Probiotic Product (PROBIOTIC PO) Take 1 tablet by mouth daily. 2 billion    [provider]  simvastatin (ZOCOR) 20 MG tablet Take 20 mg by mouth at bedtime. 12/23/17   [provider]  sitaGLIPtin (JANUVIA) 50 MG tablet Take 50 mg by mouth daily.    [provider]   TRAVATAN Z 0.004 % SOLN ophthalmic solution Place 1 drop into both eyes at bedtime. 12/05/17   [provider]  TURMERIC PO Take 538 mg by mouth daily.    [provider]  vitamin B-12 (CYANOCOBALAMIN) 1000 MCG tablet Take 2,000 mcg by mouth daily.    [provider]    Physical Exam   Triage Vital Signs: ED Triage Vitals  Encounter Vitals Group     BP 05/28/23 0451 (!) 142/52     Systolic BP Percentile --      Diastolic BP Percentile --      Pulse Rate 05/28/23 0451 98     Resp 05/28/23 0451 19     Temp 05/28/23 0451 99.7 F (37.6 C)     Temp Source 05/28/23 0451 Oral     SpO2 05/28/23 0451 99 %     Weight 05/28/23 0455 148 lb (67.1 kg)     Height 05/28/23 0455 5\' 4"  (1.626 m)     Head Circumference --      Peak  Flow --      Pain Score 05/28/23 0453 7     Pain Loc --      Pain Education --      Exclude from Growth Chart --     Most recent vital signs: Vitals:   05/28/23 0451  BP: (!) 142/52  Pulse: 98  Resp: 19  Temp: 99.7 F (37.6 C)  SpO2: 99%    CONSTITUTIONAL: Alert, responds appropriately to questions.  Elderly, appears uncomfortable HEAD: Normocephalic, atraumatic EYES: Conjunctivae clear, pupils appear equal, sclera nonicteric ENT: normal nose; dry appearing mucous membranes, patient has left-sided facial swelling with mild warmth but no erythema.  Tender over the left parotid gland.  No submandibular swelling.  Tongue sits flat in the bottom of the mouth.  Normal phonation.  No trismus, drooling.  Dentures present.  No gum swelling. NECK: Supple, normal ROM CARD: RRR; S1 and S2 appreciated RESP: Normal chest excursion without splinting or tachypnea; breath sounds clear and equal bilaterally; no wheezes, no rhonchi, no rales, no hypoxia or respiratory distress, speaking full sentences ABD/GI: Non-distended; soft, non-tender, no rebound, no guarding, no peritoneal signs BACK: The back appears normal EXT: Normal ROM in all joints; no  deformity noted, no edema SKIN: Normal color for age and race; warm; no rash on exposed skin NEURO: Moves all extremities equally, normal speech PSYCH: The patient's mood and manner are appropriate.   ED Results / Procedures / Treatments   LABS: (all labs ordered are listed, but only abnormal results are displayed) Labs Reviewed  CBC WITH DIFFERENTIAL/PLATELET - Abnormal; Notable for the following components:      Result Value   WBC 10.8 (*)    RBC 3.66 (*)    Hemoglobin 11.9 (*)    Platelets 100 (*)    Neutro Abs 9.3 (*)    Lymphs Abs 0.5 (*)    Abs Immature Granulocytes 0.10 (*)    All other components within normal limits  COMPREHENSIVE METABOLIC PANEL - Abnormal; Notable for the following components:   Sodium 134 (*)    Glucose, Bld 184 (*)    Creatinine, Ser 1.17 (*)    Total Bilirubin 2.2 (*)    GFR, Estimated 47 (*)    All other components within normal limits  CULTURE, BLOOD (SINGLE)  CULTURE, BLOOD (SINGLE)  LACTIC ACID, PLASMA  PROCALCITONIN     EKG:   RADIOLOGY: My personal review and interpretation of imaging: CT scan shows left parotitis.  I have personally reviewed all radiology reports.   CT Maxillofacial W Contrast  Result Date: 05/28/2023 CLINICAL DATA:  Left facial swelling EXAM: CT MAXILLOFACIAL WITH CONTRAST TECHNIQUE: Multidetector CT imaging of the maxillofacial structures was performed with intravenous contrast. Multiplanar CT image reconstructions were also generated. RADIATION DOSE REDUCTION: This exam was performed according to the departmental dose-optimization program which includes automated exposure control, adjustment of the mA and/or kV according to patient size and/or use of iterative reconstruction technique. CONTRAST:  75mL OMNIPAQUE IOHEXOL 300 MG/ML  SOLN COMPARISON:  None Available. FINDINGS: Osseous: No evidence of fracture or bone lesion. Patient is edentulous with alveolar atrophy. Advanced cervical spine degeneration. Orbits:  Bilateral cataract resection. Sinuses: Negative for sinusitis. Soft tissues: Heterogeneous parotid is with expansion on the left. There is some overlying skin thickening and subcutaneous reticulation as well. No underlying stone detected. The mucosa of the supraglottic larynx appears somewhat redundant but no convincing edema and no epiglottic thickening. Limited intracranial: Negative IMPRESSION: Left parotiditis.  No underlying stone. Electronically  Signed   By: Tiburcio Pea M.D.   On: 05/28/2023 06:28     PROCEDURES:  Critical Care performed: YES  CRITICAL CARE Performed by: Baxter Hire Mindy Gali   Total critical care time: 35 minutes  Critical care time was exclusive of separately billable procedures and treating other patients.  Critical care was necessary to treat or prevent imminent or life-threatening deterioration.  Critical care was time spent personally by me on the following activities: development of treatment plan with patient and/or surrogate as well as nursing, discussions with consultants, evaluation of patient's response to treatment, examination of patient, obtaining history from patient or surrogate, ordering and performing treatments and interventions, ordering and review of laboratory studies, ordering and review of radiographic studies, pulse oximetry and re-evaluation of patient's condition.      Marland Kitchen1-3 Lead EKG Interpretation  Performed by: Leslie Langille, Layla Maw, DO Authorized by: Trenee Igoe, Layla Maw, DO     Interpretation: normal     ECG rate:  98   ECG rate assessment: normal     Rhythm: sinus rhythm     Ectopy: none     Conduction: normal       IMPRESSION / MDM / ASSESSMENT AND PLAN / ED COURSE  I reviewed the triage vital signs and the nursing notes.    Patient here with left-sided facial swelling.   DIFFERENTIAL DIAGNOSIS (includes but not limited to):   Parotitis, cellulitis, abscess, malignancy   Patient's presentation is most consistent with acute  presentation with potential threat to life or bodily function.   PLAN: Will obtain labs, CT of the face.  Will give IV fluids, pain and nausea medicine.   MEDICATIONS GIVEN IN ED: Medications  Ampicillin-Sulbactam (UNASYN) 3 g in sodium chloride 0.9 % 100 mL IVPB (has no administration in time range)  fentaNYL (SUBLIMAZE) injection 50 mcg (50 mcg Intravenous Given 05/28/23 0559)  ondansetron (ZOFRAN) injection 4 mg (4 mg Intravenous Given 05/28/23 0558)  sodium chloride 0.9 % bolus 1,000 mL (1,000 mLs Intravenous New Bag/Given 05/28/23 0600)  iohexol (OMNIPAQUE) 300 MG/ML solution 75 mL (75 mLs Intravenous Contrast Given 05/28/23 0609)     ED COURSE: Patient's labs show slight leukocytosis of 10.8 with left shift.  Creatinine minimally elevated which is stable.  Lactic is normal.  Procalcitonin elevated to 1.78.  CT scan reviewed and interpreted by myself and the radiologist and shows left-sided parotitis.  Given leukocytosis, heart rate in the 90s, patient does meet criteria for sepsis.  She is getting IV fluids and will give Unasyn for possible suppurative parotitis.  Will discuss with hospitalist for admission.   CONSULTS:  7:08 AM  Consulted and discussed patient's case with hospitalist, Dr. Alvester Morin.  I have recommended admission and consulting physician agrees and will place admission orders.  Patient (and family if present) agree with this plan.   I reviewed all nursing notes, vitals, pertinent previous records.  All labs, EKGs, imaging ordered have been independently reviewed and interpreted by myself.    OUTSIDE RECORDS REVIEWED: Reviewed recent oncology notes.       FINAL CLINICAL IMPRESSION(S) / ED DIAGNOSES   Final diagnoses:  Parotitis, acute     Rx / DC Orders   ED Discharge Orders     None        Note:  This document was prepared using Dragon voice recognition software and may include unintentional dictation errors.   Azel Gumina, Layla Maw, DO 05/28/23  [phone]

## 2023-05-28 NOTE — Assessment & Plan Note (Signed)
Status post ablative therapy March 2024

## 2023-05-28 NOTE — Assessment & Plan Note (Signed)
T. bili 2.2 on presentation LFT stable Minimal active abdominal pain Check right upper quadrant ultrasound to correlate Monitor

## 2023-05-29 DIAGNOSIS — K1120 Sialoadenitis, unspecified: Secondary | ICD-10-CM | POA: Diagnosis not present

## 2023-05-29 LAB — COMPREHENSIVE METABOLIC PANEL
ALT: 13 U/L (ref 0–44)
AST: 20 U/L (ref 15–41)
Albumin: 2.6 g/dL — ABNORMAL LOW (ref 3.5–5.0)
Alkaline Phosphatase: 57 U/L (ref 38–126)
Anion gap: 4 — ABNORMAL LOW (ref 5–15)
BUN: 22 mg/dL (ref 8–23)
CO2: 21 mmol/L — ABNORMAL LOW (ref 22–32)
Calcium: 8 mg/dL — ABNORMAL LOW (ref 8.9–10.3)
Chloride: 109 mmol/L (ref 98–111)
Creatinine, Ser: 1.3 mg/dL — ABNORMAL HIGH (ref 0.44–1.00)
GFR, Estimated: 41 mL/min — ABNORMAL LOW (ref >60)
Glucose, Bld: 275 mg/dL — ABNORMAL HIGH (ref 70–99)
Potassium: 3.8 mmol/L (ref 3.5–5.1)
Sodium: 134 mmol/L — ABNORMAL LOW (ref 135–145)
Total Bilirubin: 0.9 mg/dL (ref <1.2)
Total Protein: 5.6 g/dL — ABNORMAL LOW (ref 6.5–8.1)

## 2023-05-29 LAB — CBC
HCT: 28.6 % — ABNORMAL LOW (ref 36.0–46.0)
Hemoglobin: 9.4 g/dL — ABNORMAL LOW (ref 12.0–15.0)
MCH: 32.3 pg (ref 26.0–34.0)
MCHC: 32.9 g/dL (ref 30.0–36.0)
MCV: 98.3 fL (ref 80.0–100.0)
Platelets: 61 10*3/uL — ABNORMAL LOW (ref 150–400)
RBC: 2.91 MIL/uL — ABNORMAL LOW (ref 3.87–5.11)
RDW: 14.9 % (ref 11.5–15.5)
WBC: 6 10*3/uL (ref 4.0–10.5)
nRBC: 0 % (ref 0.0–0.2)

## 2023-05-29 LAB — GLUCOSE, CAPILLARY
Glucose-Capillary: 218 mg/dL — ABNORMAL HIGH (ref 70–99)
Glucose-Capillary: 230 mg/dL — ABNORMAL HIGH (ref 70–99)
Glucose-Capillary: 223 mg/dL — ABNORMAL HIGH (ref 70–99)
Glucose-Capillary: 241 mg/dL — ABNORMAL HIGH (ref 70–99)

## 2023-05-29 MED ORDER — INSULIN GLARGINE-YFGN 100 UNIT/ML ~~LOC~~ SOLN
15.0000 [IU] | Freq: Every day | SUBCUTANEOUS | Status: DC
  Administered 2023-05-30: 15 [IU] via SUBCUTANEOUS
  Filled 2023-05-29: qty 0.15

## 2023-05-29 MED ORDER — INSULIN ASPART 100 UNIT/ML IJ SOLN
3.0000 [IU] | Freq: Once | INTRAMUSCULAR | Status: AC
  Administered 2023-05-29: 3 [IU] via SUBCUTANEOUS
  Filled 2023-05-29: qty 1

## 2023-05-29 NOTE — TOC Progression Note (Signed)
Transition of Care [facility]) - Progression Note    Patient Details  Name: Emily Faulkner MRN: [id number] Date of Birth: Jan 10, 1941  [facility]) CM/SW Contact  Margarito Liner, LCSW Phone Number: 05/29/2023, 11:52 AM  Clinical Narrative:   CSW acknowledges consult for home health/DME needs. Patient is active with [facility] for PT. Per chart review, she has a SPC at home. Patient is currently on acute oxygen. Will follow for this potential home need as well. Please consult PT and OT if patient needs further evaluation for discharge needs.  Expected Discharge Plan: Home w Home Health Services Barriers to Discharge: Continued Medical Work up  Expected Discharge Plan and Services       Living arrangements for the past 2 months: Single Family Home                           HH Arranged: PT [facility] Agency: [facility] Date [facility] Agency Contacted: 05/29/23   Representative spoke with at [facility] Agency: Lorenza Chick   Social Determinants of Health (SDOH) Interventions SDOH Screenings   Food Insecurity: No Food Insecurity (05/28/2023)  Housing: Low Risk  (05/28/2023)  Transportation Needs: No Transportation Needs (05/28/2023)  Utilities: Not At Risk (05/28/2023)  Depression (PHQ2-9): Low Risk  (02/05/2022)  Financial Resource Strain: Low Risk  (03/27/2023)   Received from [facility]  Tobacco Use: Medium Risk (05/02/2023)    Readmission Risk Interventions    10/28/2020    4:00 PM  Readmission Risk Prevention Plan  Transportation Screening Complete  PCP or Specialist Appt within 3-5 Days Complete  HRI or Home Care Consult Complete  Social Work Consult for Recovery Care Planning/Counseling Complete  Palliative Care Screening Not Applicable  Medication Review Oceanographer) Complete

## 2023-05-29 NOTE — Progress Notes (Signed)
Progress Note   Patient: Emily Faulkner MVH:[id number] DOB: [birth year] DOA: 05/28/2023     1 DOS: the patient was seen and examined on 05/29/2023   Brief hospital course:  Emily Faulkner is a 82 y.o. female with medical history significant of CKD, complete heart block status post permanent pacemaker, osteoporosis, hypertension, hyperlipidemia, renal cancer status post ablation, vertebral fracture s/p kyphoplasty, chronic pain presenting w/ L sided jaw pain and parotiditis.  Patient reports worsening left-sided jaw pain over the past 3 to 4 days.  Progressive swelling.  Mild drainage.  Mild pain with swallowing.  Minimal to mild trismus.  Positive malaise.  Has been able to swallow.  Albeit with decreased p.o. intake since onset of symptoms.  Patient reports similar episode of right-sided parotitis roughly 2 months ago that was treated with outpatient antibiotics.  No chest pain or shortness of breath.  No nausea or vomiting.  Non-smoker.  Denies any active alcohol use. Presented to ER Tmax 99.7, hemodynamically stable.  White count 10.8, hemoglobin 11.9, platelets 100, creatinine 1.17, glucose 184.  T. bili 2.2.  Maxillofacial CT with left-sided parotitis.  Assessment and Plan:  Acute left parotiditis Worst L sided jaw and facial swelling w/ noted parotiditis on maxiofacial CT  Patient presented with Tmax 99.7, WBC 11 in the setting of left parotid pain Patient received IV Decadron on presentation Continue IV Unasyn Continue current pain control ENT surgeon Dr. Jenne Campus aware of patient and case discussed on admission Patient could potentially be discharged on augmentin vs. Clindamycin possibly tomorrow if swelling is much better and pain improved     Intractable back pain S/p T10 kyphoplasty on May 14, 2023 w/ NSG Appears near baseline  Cont outpt pain regimen       Stage 3b chronic kidney disease (HCC) Cr 1.17 w/ GFR in the 40s  At baseline  Monitor      Thrombocytopenia  (HCC) Slightly worse today Looks to be fairly chronic  Continue to monitor closely     Hyperbilirubinemia T. bili 2.2 on presentation LFT stable Minimal active abdominal pain Abdominal ultrasound showed findings of cirrhosis, splenomegaly and trace ascites   Renal mass Status post ablative therapy March 2024  Outpatient follow-up   Hyperlipidemia, unspecified Continue statin therapy     Essential hypertension Not on antihypertensives on account of relative hypotension     Personal history of breast cancer Followed by Dr. Smith Robert outpt    Type 2 diabetes mellitus (HCC) with hyperglycemia Long-acting insulin have been increased from 10 units to 15 units today Continue current sliding scale insulin therapy Monitor glucose level closely     Advance Care Planning:   Code Status: Full Code    Consults: ENT    Subjective:  Patient seen and examined at bedside this morning Complaints of pain involving the left parotid region as well as swelling Denies nausea vomiting abdominal pain  Physical Exam: HEENT: Left parotid region tenderness with swelling-improving Eyes:     Pupils: Pupils are equal, round, and reactive to light.  Cardiovascular:     Rate and Rhythm: Normal rate and regular rhythm.  Pulmonary:     Effort: Pulmonary effort is normal.  Abdominal:     General: Bowel sounds are normal.  Musculoskeletal:        General: Normal range of motion.  Skin:    General: Skin is warm.  Neurological:     General: No focal deficit present.  Psychiatric:        Mood  and Affect: Mood normal.   Vitals:   05/28/23 1035 05/28/23 1932 05/29/23 0307 05/29/23 0836  BP: (!) 135/52 (!) 118/49 (!) 115/48 117/63  Pulse: 80 74 71 76  Resp: 18 19 16 16   Temp: 98.3 F (36.8 C) 98.3 F (36.8 C) 97.8 F (36.6 C) 97.9 F (36.6 C)  TempSrc: Oral Oral Oral   SpO2: 99% 95% 96% 96%  Weight:      Height:        Data Reviewed: I have reviewed CT scan of maxillofacial region  showing Left parotiditis with no underlying stone     Latest Ref Rng & Units 05/29/2023    3:52 AM 05/28/2023    5:05 AM 05/02/2023    4:00 PM  CBC  WBC 4.0 - 10.5 K/uL 6.0  10.8  4.8   Hemoglobin 12.0 - 15.0 g/dL 9.4  62.9  52.8   Hematocrit 36.0 - 46.0 % 28.6  36.0  35.3   Platelets 150 - 400 K/uL 61  100  143        Latest Ref Rng & Units 05/29/2023    3:52 AM 05/28/2023    5:05 AM 05/02/2023    4:00 PM  BMP  Glucose 70 - 99 mg/dL 413  244  010   BUN 8 - 23 mg/dL 22  17  17    Creatinine 0.44 - 1.00 mg/dL 2.72  5.36  6.44   BUN/Creat Ratio 6 - 22 (calc)   12   Sodium 135 - 145 mmol/L 134  134  140   Potassium 3.5 - 5.1 mmol/L 3.8  3.6  4.3   Chloride 98 - 111 mmol/L 109  104  104   CO2 22 - 32 mmol/L 21  23  25    Calcium 8.9 - 10.3 mg/dL 8.0  9.4  9.9      Family Communication: No family at bedside  Disposition: Hopefully discharge home tomorrow  Time spent: 56 minutes  Author: Loyce Dys, MD 05/29/2023 4:45 PM  For on call review [email or web address].

## 2023-05-29 NOTE — Inpatient Diabetes Management (Signed)
Inpatient Diabetes Program Recommendations  AACE/ADA: New Consensus Statement on Inpatient Glycemic Control (2015)  Target Ranges:  Prepandial:   less than 140 mg/dL      Peak postprandial:   less than 180 mg/dL (1-2 hours)      Critically ill patients:  140 - 180 mg/dL   Lab Results  Component Value Date   GLUCAP 218 (H) 05/29/2023   HGBA1C 7.5 (H) 04/09/2023    Latest Reference Range & Units 05/28/23 11:37 05/28/23 16:48 05/28/23 21:39 05/28/23 23:42 05/29/23 08:37  Glucose-Capillary 70 - 99 mg/dL 161 (H) 096 (H) 045 (H) 324 (H) 218 (H)  (H): Data is abnormally high  Review of Glycemic Control  Diabetes history: DM2 Outpatient Diabetes medications: Lantus 28 units daily, Januvia 50 mg daily Current orders for Inpatient glycemic control: Semglee 10 units daily, Novolog 0-9 units tid correction  Inpatient Diabetes Program Recommendations:   Please consider: -Increase Semglee to 15 units daily  Thank you, Darel Hong E. Flay Ghosh, RN, MSN, CDCES  Diabetes Coordinator Inpatient Glycemic Control Team Team Pager [phone] (8am-5pm) 05/29/2023 10:33 AM

## 2023-05-30 ENCOUNTER — Telehealth: Payer: Self-pay | Admitting: *Deleted

## 2023-05-30 DIAGNOSIS — K1120 Sialoadenitis, unspecified: Secondary | ICD-10-CM | POA: Diagnosis not present

## 2023-05-30 LAB — CBC WITH DIFFERENTIAL/PLATELET
Abs Immature Granulocytes: 0.03 10*3/uL (ref 0.00–0.07)
Basophils Absolute: 0 10*3/uL (ref 0.0–0.1)
Basophils Relative: 0 %
Eosinophils Absolute: 0 10*3/uL (ref 0.0–0.5)
Eosinophils Relative: 0 %
HCT: 31.4 % — ABNORMAL LOW (ref 36.0–46.0)
Hemoglobin: 10.3 g/dL — ABNORMAL LOW (ref 12.0–15.0)
Immature Granulocytes: 1 %
Lymphocytes Relative: 7 %
Lymphs Abs: 0.4 10*3/uL — ABNORMAL LOW (ref 0.7–4.0)
MCH: 33.1 pg (ref 26.0–34.0)
MCHC: 32.8 g/dL (ref 30.0–36.0)
MCV: 101 fL — ABNORMAL HIGH (ref 80.0–100.0)
Monocytes Absolute: 0.3 10*3/uL (ref 0.1–1.0)
Monocytes Relative: 5 %
Neutro Abs: 5.5 10*3/uL (ref 1.7–7.7)
Neutrophils Relative %: 87 %
Platelets: 63 10*3/uL — ABNORMAL LOW (ref 150–400)
RBC: 3.11 MIL/uL — ABNORMAL LOW (ref 3.87–5.11)
RDW: 15.2 % (ref 11.5–15.5)
WBC: 6.3 10*3/uL (ref 4.0–10.5)
nRBC: 0 % (ref 0.0–0.2)

## 2023-05-30 LAB — BASIC METABOLIC PANEL
Anion gap: 10 (ref 5–15)
BUN: 29 mg/dL — ABNORMAL HIGH (ref 8–23)
CO2: 21 mmol/L — ABNORMAL LOW (ref 22–32)
Calcium: 8.8 mg/dL — ABNORMAL LOW (ref 8.9–10.3)
Chloride: 108 mmol/L (ref 98–111)
Creatinine, Ser: 1.6 mg/dL — ABNORMAL HIGH (ref 0.44–1.00)
GFR, Estimated: 32 mL/min — ABNORMAL LOW (ref >60)
Glucose, Bld: 190 mg/dL — ABNORMAL HIGH (ref 70–99)
Potassium: 4 mmol/L (ref 3.5–5.1)
Sodium: 139 mmol/L (ref 135–145)

## 2023-05-30 LAB — IRON AND TIBC
Iron: 24 ug/dL — ABNORMAL LOW (ref 28–170)
Saturation Ratios: 11 % (ref 10.4–31.8)
TIBC: 224 ug/dL — ABNORMAL LOW (ref 250–450)
UIBC: 200 ug/dL

## 2023-05-30 LAB — FERRITIN: Ferritin: 136 ng/mL (ref 11–307)

## 2023-05-30 LAB — GLUCOSE, CAPILLARY
Glucose-Capillary: 137 mg/dL — ABNORMAL HIGH (ref 70–99)
Glucose-Capillary: 192 mg/dL — ABNORMAL HIGH (ref 70–99)

## 2023-05-30 MED ORDER — OYSTER SHELL CALCIUM/D3 500-5 MG-MCG PO TABS
1.0000 | ORAL_TABLET | Freq: Every day | ORAL | Status: DC
  Administered 2023-05-30: 1 via ORAL
  Filled 2023-05-30: qty 1

## 2023-05-30 MED ORDER — SODIUM CHLORIDE 0.9 % IV SOLN
3.0000 g | Freq: Two times a day (BID) | INTRAVENOUS | Status: DC
  Administered 2023-05-30: 3 g via INTRAVENOUS
  Filled 2023-05-30: qty 8

## 2023-05-30 MED ORDER — AMOXICILLIN-POT CLAVULANATE 500-125 MG PO TABS
1.0000 | ORAL_TABLET | Freq: Two times a day (BID) | ORAL | Status: DC
Start: 2023-05-30 — End: 2023-05-30

## 2023-05-30 MED ORDER — LINAGLIPTIN 5 MG PO TABS
5.0000 mg | ORAL_TABLET | Freq: Every day | ORAL | Status: DC
  Administered 2023-05-30: 5 mg via ORAL
  Filled 2023-05-30: qty 1

## 2023-05-30 MED ORDER — VITAMIN D 25 MCG (1000 UNIT) PO TABS
2000.0000 [IU] | ORAL_TABLET | Freq: Every day | ORAL | Status: DC
  Administered 2023-05-30: 2000 [IU] via ORAL
  Filled 2023-05-30: qty 2

## 2023-05-30 MED ORDER — AMOXICILLIN-POT CLAVULANATE 875-125 MG PO TABS: 1.0000 | ORAL_TABLET | Freq: Two times a day (BID) | ORAL | Status: DC

## 2023-05-30 MED ORDER — AMOXICILLIN-POT CLAVULANATE 500-125 MG PO TABS: 1.0000 | ORAL_TABLET | Freq: Two times a day (BID) | ORAL | 0 refills | Status: AC

## 2023-05-30 NOTE — TOC Transition Note (Signed)
Transition of Care [facility]) - CM/SW Discharge Note   Patient Details  Name: Emily Faulkner MRN: [id number] Date of Birth: Feb 12, 1941  Transition of Care [facility]) CM/SW Contact:  Margarito Liner, LCSW Phone Number: 05/30/2023, 2:14 PM   Clinical Narrative:   Patient has orders to discharge home today. CSW left message for the [facility] liaison to notify. No further concerns. CSW signing off.  Final next level of care: Home w Home Health Services Barriers to Discharge: Barriers Resolved   Patient Goals and CMS Choice      Discharge Placement                      Patient and family notified of of transfer: 05/30/23  Discharge Plan and Services Additional resources added to the After Visit Summary for                            [facility] Arranged: PT [facility] Agency: [facility] Date [facility] Agency Contacted: 05/30/23   Representative spoke with at [facility] Agency: Lorenza Chick  Social Determinants of Health (SDOH) Interventions SDOH Screenings   Food Insecurity: No Food Insecurity (05/28/2023)  Housing: Low Risk  (05/28/2023)  Transportation Needs: No Transportation Needs (05/28/2023)  Utilities: Not At Risk (05/28/2023)  Depression (PHQ2-9): Low Risk  (02/05/2022)  Financial Resource Strain: Low Risk  (03/27/2023)   Received from [facility]  Tobacco Use: Medium Risk (05/02/2023)     Readmission Risk Interventions    10/28/2020    4:00 PM  Readmission Risk Prevention Plan  Transportation Screening Complete  PCP or Specialist Appt within 3-5 Days Complete  HRI or Home Care Consult Complete  Social Work Consult for Recovery Care Planning/Counseling Complete  Palliative Care Screening Not Applicable  Medication Review Oceanographer) Complete

## 2023-05-30 NOTE — Progress Notes (Signed)
PHARMACY NOTE:  ANTIMICROBIAL RENAL DOSAGE ADJUSTMENT  Current antimicrobial regimen includes a mismatch between antimicrobial dosage and estimated renal function.  As per policy approved by the [employer]acy [employer] and Medical Executive Committees, the antimicrobial dosage will be adjusted accordingly.  Current antimicrobial dosage:  Ampicillin-Sulbactam 3 grams IV every 6 hours  Indication: parotiditis  Renal Function:  Estimated Creatinine Clearance: 25.5 mL/min (A) (by C-G formula based on SCr of 1.6 mg/dL (H)).     Antimicrobial dosage has been changed to:  Ampicillin-Sulbactam 3 grams IV every 12 hours  Thank you for allowing pharmacy to be a part of this patient's care.  Lowella Bandy, [facility] 05/30/2023 7:23 AM

## 2023-05-30 NOTE — Telephone Encounter (Signed)
I called and spoke to son and the labs were done in the hospital and Smith Robert will look at the numbers, and if we need to do something then then we will call. Also I cancelled the appt for next tues. He is ok with this

## 2023-05-30 NOTE — Telephone Encounter (Signed)
Patient son called reporting that patient is in hospital and that she has a lab appointment with [place] Tuesday and he is asking if her labs we need can be drawn while in hospital because she will not want to come out Tuesday if she goes home . Please advise

## 2023-05-30 NOTE — Consult Note (Signed)
PHARMACY NOTE:  ANTIMICROBIAL RENAL DOSAGE ADJUSTMENT  Current antimicrobial regimen includes a mismatch between antimicrobial dosage and estimated renal function.  As per policy approved by the Pharmacy [employer] The[employer] and Medical Executive Committees, the antimicrobial dosage will be adjusted accordingly.  Current antimicrobial dosage:  Augmentin 875/125 mg PO twice daily  Indication: Parotiditis  Renal Function:  Estimated Creatinine Clearance: 25.5 mL/min (A) (by C-G formula based on SCr of 1.6 mg/dL (H)). []      On intermittent HD, scheduled: []      On CRRT    Antimicrobial dosage has been changed to:  Augmentin 500/125 mg PO twice daily  Additional comments:   Thank you for allowing pharmacy to be a part of this patient's care.  Will M. Dareen Piano, PharmD Clinical Pharmacist 05/30/2023 1:57 PM

## 2023-05-30 NOTE — Discharge Summary (Signed)
Physician Discharge Summary   Patient: Emily Faulkner MRN: [id number] DOB: 1940/10/15  Admit date:     05/28/2023  Discharge date: 05/30/23  Discharge Physician: Enedina Finner   PCP: Marina Goodell, MD   Recommendations at discharge:    F/u PCP in 1-2 weeks F/u Dr Jenne Campus ENT, if needed  Discharge Diagnoses: Principal Problem:   Parotiditis Active Problems:   Intractable back pain   Stage 3b chronic kidney disease (HCC)   Type 2 diabetes mellitus (HCC)   Personal history of breast cancer   Hypertension   Hyperlipidemia, unspecified   Renal mass   Hyperbilirubinemia   Thrombocytopenia (HCC)  Emily Faulkner is a 82 y.o. female with medical history significant of CKD, complete heart block status post permanent pacemaker, osteoporosis, hypertension, hyperlipidemia, renal cancer status post ablation, vertebral fracture s/p kyphoplasty, chronic pain presenting w/ L sided jaw pain and parotiditis.  Patient reports worsening left-sided jaw pain over the past 3 to 4 days.   Acute left parotiditis --Worst L sided jaw and facial swelling w/ noted parotiditis on maxiofacial CT  --Patient presented with Tmax 99.7, WBC 11 in the setting of left parotid pain --Patient received IV Decadron x3 doses on presentation. Pt overall better. Steroids was not continued -- IV Unasyn-- change to po Augmentin --Continue current pain control --ENT surgeon Dr. Jenne Campus aware of patient and case discussed on admission -- discussed with patient's son see ENT if needed else complete antibiotic regimen at home    Intractable back pain --S/p T10 kyphoplasty on May 14, 2023 w/ NSG --Appears near baseline  --Cont outpt pain regimen  -- will resume outpatient PT    Stage 3b chronic kidney disease (HCC) Cr 1.17 w/ GFR in the 40s  At baseline   Thrombocytopenia (HCC) Slightly worse today Looks to be fairly chronic    Hyperbilirubinemia T. bili 2.2 on presentation LFT stable Minimal active  abdominal pain Abdominal ultrasound showed findings of cirrhosis, splenomegaly and trace ascites   Renal mass Status post ablative therapy March 2024  Outpatient follow-up   Hyperlipidemia, unspecified Continue statin therapy   Essential hypertension --losartan   Personal history of breast cancer Followed by Dr. Smith Robert outpt    Type 2 diabetes mellitus (HCC) with hyperglycemia -patient to resume her home insulin and home PO diabetes meds. She will monitor her sugars and take insulin according to instructions given by her PCP  Discharge plan was discussed with patient's son Yong Channel on the phone.   Advance Care Planning:   Code Status: Full Code     Disposition: Home Diet recommendation:  Discharge Diet Orders (From admission, onward)     Start     Ordered   05/30/23 0000  Diet - low sodium heart healthy        05/30/23 1404            DISCHARGE MEDICATION: Allergies as of 05/30/2023   No Known Allergies      Medication List     TAKE these medications    Accu-Chek Aviva Plus test strip Generic drug: glucose blood   Accu-Chek Softclix Lancets lancets once daily Use as instructed.   acetaminophen 500 MG tablet Commonly known as: TYLENOL Take 500 mg by mouth every 8 (eight) hours as needed for moderate pain (pain score 4-6) or mild pain (pain score 1-3).   alendronate 70 MG tablet Commonly known as: FOSAMAX TAKE 1 TABLET BY MOUTH EVERY 7DAYS WITH A FULL GLASS OF WATER. DO NOT  LIE DOWN FOR 30 MINUTES   amoxicillin-clavulanate 500-125 MG tablet Commonly known as: AUGMENTIN Take 1 tablet by mouth every 12 (twelve) hours for 5 days.   Biotin 1000 MCG tablet Take 1,000 mcg by mouth daily.   Cholecalciferol 50 MCG (2000 UT) Caps Take 2,000 Units by mouth daily.   cyanocobalamin 1000 MCG tablet Commonly known as: VITAMIN B12 Take 2,000 mcg by mouth daily.   feeding supplement Liqd Take 237 mLs by mouth 2 (two) times daily between meals.    HYDROcodone-acetaminophen 5-325 MG tablet Commonly known as: Norco Take 1 tablet by mouth every 8 (eight) hours as needed for severe pain (pain score 7-10).   insulin glargine 100 UNIT/ML Solostar Pen Commonly known as: LANTUS Inject 15 Units into the skin daily. (may take up to 50u based upon blood glucose reading) What changed: how much to take   Insulin Pen Needle 32G X 4 MM Misc USE 1 NEEDLE SUBCUTANEOUSLY ONCE A DAY AS DIRECTED   losartan 50 MG tablet Commonly known as: COZAAR Take 25 mg by mouth daily.   niacin 500 MG CR capsule Take 500 mg by mouth at bedtime.   omeprazole 40 MG capsule Commonly known as: PRILOSEC TAKE 1 CAPSULE BY MOUTH EVERY DAY BEFORE BREAKFAST   Oyster Shell Calcium w/D 500-5 MG-MCG Tabs Take 1 tablet by mouth daily.   PROBIOTIC PO Take 1 tablet by mouth daily. 2 billion   simvastatin 20 MG tablet Commonly known as: ZOCOR Take 20 mg by mouth at bedtime.   sitaGLIPtin 50 MG tablet Commonly known as: JANUVIA Take 50 mg by mouth daily.   Travatan Z 0.004 % Soln ophthalmic solution Generic drug: Travoprost (BAK Free) Place 1 drop into both eyes at bedtime.   TURMERIC PO Take 538 mg by mouth daily.        Follow-up Information     Lanell Persons, MD .   Specialty: Otolaryngology Why: As needed Contact information: [address] [place] [address] [phone]         Marina Goodell, MD. Schedule an appointment as soon as possible for a visit .   Specialty: Family Medicine Why: If symptoms worsen Contact information: [address] [place] [address] [phone]         [facility] Follow up.   Specialty: Home Health Services Why: They will resume home health physical therapy at discharge. Contact information: [address] [place] [address] [phone]         Linus Salmons, MD. Go to.   Specialty: Otolaryngology Why: if needed Contact  information: [address] [place] [address] [phone]                Discharge Exam: Ceasar Mons Weights   05/28/23 0455  Weight: 67.1 kg   Alert and oriented times three no tenderness over the left parotid area cardiovascular both heart sounds normal respiratory clear to auscultation  Condition at discharge: fair  The results of significant diagnostics from this hospitalization (including imaging, microbiology, ancillary and laboratory) are listed below for reference.   Imaging Studies: US Abdomen Limited RUQ (LIVER/GB)  Result Date: 05/28/2023 CLINICAL DATA:  Hyperbilirubinemia, cholecystectomy EXAM: ULTRASOUND ABDOMEN LIMITED RIGHT UPPER QUADRANT COMPARISON:  04/08/2023, 12/01/2021 FINDINGS: Gallbladder: Surgically absent. Common bile duct: Diameter: 6 mm Liver: Coarsened liver echotexture and nodularity of the liver capsule consistent with cirrhosis. No focal parenchymal liver abnormality or intrahepatic duct dilation. Portal vein  is patent on color Doppler imaging with normal direction of blood flow towards the liver. Other: Trace ascites. Spleen is enlarged measuring 15.6 cm in the midclavicular line. IMPRESSION: 1. Cirrhosis. 2. Splenomegaly. 3. Trace ascites. Electronically Signed   By: Sharlet Salina M.D.   On: 05/28/2023 22:38   DG Chest Port 1 View  Result Date: 05/28/2023 CLINICAL DATA:  Hypoxia with shortness of breath EXAM: PORTABLE CHEST 1 VIEW COMPARISON:  10/25/2020 FINDINGS: Cardiomegaly with dual-chamber pacer in unremarkable position. Generalized interstitial coarsening which was also seen on prior. There is interstitial lung disease by recent chest CT. Right mastectomy and axillary dissection. Normal aortic contours. Stable borderline heart size. IMPRESSION: Chronic lung disease without acute superimposed finding when compared to priors. Electronically Signed   By: Tiburcio Pea M.D.   On: 05/28/2023 07:35   CT Maxillofacial W  Contrast  Result Date: 05/28/2023 CLINICAL DATA:  Left facial swelling EXAM: CT MAXILLOFACIAL WITH CONTRAST TECHNIQUE: Multidetector CT imaging of the maxillofacial structures was performed with intravenous contrast. Multiplanar CT image reconstructions were also generated. RADIATION DOSE REDUCTION: This exam was performed according to the departmental dose-optimization program which includes automated exposure control, adjustment of the mA and/or kV according to patient size and/or use of iterative reconstruction technique. CONTRAST:  75mL OMNIPAQUE IOHEXOL 300 MG/ML  SOLN COMPARISON:  None Available. FINDINGS: Osseous: No evidence of fracture or bone lesion. Patient is edentulous with alveolar atrophy. Advanced cervical spine degeneration. Orbits: Bilateral cataract resection. Sinuses: Negative for sinusitis. Soft tissues: Heterogeneous parotid is with expansion on the left. There is some overlying skin thickening and subcutaneous reticulation as well. No underlying stone detected. The mucosa of the supraglottic larynx appears somewhat redundant but no convincing edema and no epiglottic thickening. Limited intracranial: Negative IMPRESSION: Left parotiditis.  No underlying stone. Electronically Signed   By: Tiburcio Pea M.D.   On: 05/28/2023 06:28   IR Radiologist Eval & Mgmt  Result Date: 05/23/2023 EXAM: ESTABLISHED PATIENT OFFICE VISIT CHIEF COMPLAINT: Refer to EMR HISTORY OF PRESENT ILLNESS: The patient returns following recent T10 kyphoplasty on May 14, 2023. The patient continues to report fairly significant lower back pain near or just below the level of her recent kyphoplasty. The pain radiates laterally across her right lower ribcage/abdomen. Due to her significant ongoing pain, repeat radiographs of the thoracic and lumbar spine were performed. These demonstrated expected post vertebral body augmentation changes without new compression fracture identified. Given the relative significant  height loss of the fracture, I suspect that she may need additional time for healing given that the kyphoplasty remains less than 2 weeks ago. She continued follow-up with are referring neurosurgical colleagues for overall spine evaluation given her coexisting degenerative disc disease. We will plan to follow up in an additional 4 weeks and reassess the need for any additional imaging, such as a 3D bone scan, which may highlight a radiographically occult fracture since the patient can not obtain an MRI. REVIEW OF SYSTEMS: Refer to EMR PHYSICAL EXAMINATION: Refer to EMR ASSESSMENT AND PLAN: Refer to EMR Electronically Signed   By: Olive Bass M.D.   On: 05/23/2023 15:14   DG Thoracic Spine 2 View  Result Date: 05/23/2023 CLINICAL DATA:  Status post recent T10 kyphoplasty, continued mid to lower back pain EXAM: THORACIC SPINE 2 VIEWS; LUMBAR SPINE - 2-3 VIEW COMPARISON:  Multiple priors FINDINGS: Thoracic spine: Gentle sigmoid scoliotic curvature of the thoracolumbar spine. Interval T10 vertebral body augmentation without complicating feature. Stable chronic changes of T12 vertebral  body augmentation. Similar chronic wedging involving the inferior endplate of the T11 vertebral body. No new compression fracture identified. Osteopenia. Implantable cardiac device is present. Atherosclerosis. Lumbar spine: Gentle sigmoid scoliotic curvature of the thoracolumbar spine. Stable chronic changes of L4 vertebral body augmentation. No new compression fracture identified. Multilevel degenerative changes with severe disc space disease at L2-L3. Facet arthropathy more pronounced in the lower lumbar spine. Atherosclerosis. IMPRESSION: Thoracic, lumbar spine: 1. Interval T10 vertebral body augmentation without complicating feature. No new compression fracture identified. 2. Similar chronic changes of additional vertebral body augmentation at T12 and L4. 3. Multilevel degenerative changes most pronounced at L2-L3 with severe  disc space disease. Electronically Signed   By: Olive Bass M.D.   On: 05/23/2023 14:22   DG Lumbar Spine 2-3 Views  Result Date: 05/23/2023 CLINICAL DATA:  Status post recent T10 kyphoplasty, continued mid to lower back pain EXAM: THORACIC SPINE 2 VIEWS; LUMBAR SPINE - 2-3 VIEW COMPARISON:  Multiple priors FINDINGS: Thoracic spine: Gentle sigmoid scoliotic curvature of the thoracolumbar spine. Interval T10 vertebral body augmentation without complicating feature. Stable chronic changes of T12 vertebral body augmentation. Similar chronic wedging involving the inferior endplate of the T11 vertebral body. No new compression fracture identified. Osteopenia. Implantable cardiac device is present. Atherosclerosis. Lumbar spine: Gentle sigmoid scoliotic curvature of the thoracolumbar spine. Stable chronic changes of L4 vertebral body augmentation. No new compression fracture identified. Multilevel degenerative changes with severe disc space disease at L2-L3. Facet arthropathy more pronounced in the lower lumbar spine. Atherosclerosis. IMPRESSION: Thoracic, lumbar spine: 1. Interval T10 vertebral body augmentation without complicating feature. No new compression fracture identified. 2. Similar chronic changes of additional vertebral body augmentation at T12 and L4. 3. Multilevel degenerative changes most pronounced at L2-L3 with severe disc space disease. Electronically Signed   By: Olive Bass M.D.   On: 05/23/2023 14:22   IR KYPHO THORACIC WITH BONE BIOPSY  Result Date: 05/14/2023 CLINICAL DATA:  82 year old female with a highly symptomatic osteoporotic compression fracture of the T10 vertebral body. In addition, she has a left breast mass. Therefore, she presents for T10 cement augmentation with balloon kyphoplasty along with concurrent bone biopsy. EXAM: FLUOROSCOPIC GUIDED KYPHOPLASTY OF THE T10 VERTEBRAL BODY COMPARISON:  CT scan of the thoracic spine 04/08/2023 MEDICATIONS: As antibiotic prophylaxis,  2 g Ancef was ordered pre-procedure and administered intravenously by the Radiology nurse within 1 hour of incision. ANESTHESIA/SEDATION: Moderate (conscious) sedation was employed during this procedure. A total of Versed 1.5 mg and Fentanyl 75 mcg was administered intravenously by the Radiology nurse. Moderate Sedation Time: 25 minutes. The patient's level of consciousness and vital signs were monitored continuously by radiology nursing throughout the procedure under my direct supervision. FLUOROSCOPY TIME:  Radiation exposure index: 39.4 mGy, reference air kerma COMPLICATIONS: None immediate. PROCEDURE: The procedure, risks (including but not limited to bleeding, infection, organ damage), benefits, and alternatives were explained to the patient. Questions regarding the procedure were encouraged and answered. The patient understands and consents to the procedure. The patient was placed prone on the fluoroscopic table. The skin overlying the thoracic region was then prepped and draped in the usual sterile fashion. Maximal barrier sterile technique was utilized including caps, mask, sterile gowns, sterile gloves, sterile drape, hand hygiene and skin antiseptic. Intravenous Fentanyl and Versed were administered as conscious sedation during continuous cardiorespiratory monitoring by the radiology RN. The left pedicle at T10 was then infiltrated with 1% lidocaine followed by the advancement of a Kyphon trocar needle through the left  pedicle into the posterior one-third of the vertebral body. The trocar was removed and a bone biopsy was obtained at this location. Subsequently, the osteo drill was advanced to the anterior third of the vertebral body. The osteo drill was retracted. Through the working cannula, a Kyphon inflatable bone tamp 15 x 2.5 was advanced and positioned with the distal marker approximately 5 mm from the anterior aspect of the cortex. Appropriate positioning was confirmed on the AP projection. There  is excellent crossing of the midline with central placement of the balloon. At this time, the balloon was expanded using contrast via a Kyphon inflation syringe device via micro tubing. Inflations were continued until there was near apposition with the superior end plate. At this time, methylmethacrylate mixture was reconstituted in the Kyphon bone mixing device system. This was then loaded into the delivery mechanism, attached to Kyphon bone fillers. The balloon was deflated and removed followed by the instillation of methylmethacrylate mixture with excellent filling in the AP and lateral projections. No extravasation was noted in the disk spaces or posteriorly into the spinal canal. No epidural venous contamination was seen. The working cannulae and the bone filler were then retrieved and removed. Hemostasis was achieved with manual compression. The patient tolerated the procedure well without immediate postprocedural complication. IMPRESSION: 1. Technically successful T10 vertebral body augmentation using balloon kyphoplasty. 2. Technically successful T10 vertebral body biopsy. 3. Per CMS PQRS reporting requirements (PQRS Measure 24): Given the patient's age of greater than 50 and the fracture site (hip, distal radius, or spine), the patient should be tested for osteoporosis using DXA, and the appropriate treatment considered based on the DXA results. Electronically Signed   By: Malachy Moan M.D.   On: 05/14/2023 11:20   IR Radiologist Eval & Mgmt  Result Date: 05/02/2023 EXAM: NEW PATIENT OFFICE VISIT CHIEF COMPLAINT: Refer to EMR HISTORY OF PRESENT ILLNESS: The patient reports the insidious onset severe lower back pain, which she became acutely aware of approximately 2-3 weeks ago while washing dishes. The pain continued to worsen affecting her ability to walk, ultimately resulting in her presentation to the local emergency room on April 08, 2023. This resulted in CT imaging of the abdomen and  pelvis and thoracic and lumbar spine, which demonstrated a new acute subacute compression fracture of the T10 vertebral body superior endplate. She was evaluated by neurosurgery in the hospital, and was discharged several days later with a TLSO brace and narcotic pain medication. Sensory discharge, she continues to report severe 10/10 back pain. She has been compliant with her TLSO brace. She has had home physical therapy 2 times per week. With the pain medication, she reports that her pain may improved to 5 over 10. She reports severe disability questionnaire with 20/24 positive. Of note, she had a recent DEXA scan on April 25, 2023 which demonstrated osteoporosis. She is being treated for osteoporosis with alendronate sodium 70 mg once weekly. She has also had previous kyphoplasties (T12 and L4), most recently towards the end of 2023. She has reported good relief with previous kyphoplasties. Lastly, her most recent imaging in the hospital demonstrated a left breast mass, which is currently being worked up. Patient presents for management of multiple medical problems. REVIEW OF SYSTEMS: Refer to EMR PHYSICAL EXAMINATION: Refer to EMR ASSESSMENT AND PLAN: Refer to EMR Electronically Signed   By: Olive Bass M.D.   On: 05/02/2023 16:51   IR Radiologist Eval & Mgmt  Result Date: 05/02/2023 EXAM: NEW PATIENT OFFICE VISIT  CHIEF COMPLAINT: Refer to EMR HISTORY OF PRESENT ILLNESS: The patient reports the insidious onset severe lower back pain, which she became acutely aware of approximately 2-3 weeks ago while washing dishes. The pain continued to worsen affecting her ability to walk, ultimately resulting in her presentation to the local emergency room on April 08, 2023. This resulted in CT imaging of the abdomen and pelvis and thoracic and lumbar spine, which demonstrated a new acute subacute compression fracture of the T10 vertebral body superior endplate. She was evaluated by neurosurgery in the hospital,  and was discharged several days later with a TLSO brace and narcotic pain medication. Sensory discharge, she continues to report severe 10/10 back pain. She has been compliant with her TLSO brace. She has had home physical therapy 2 times per week. With the pain medication, she reports that her pain may improved to 5 over 10. She reports severe disability questionnaire with 20/24 positive. Of note, she had a recent DEXA scan on April 25, 2023 which demonstrated osteoporosis. She is being treated for osteoporosis with alendronate sodium 70 mg once weekly. She has also had previous kyphoplasties (T12 and L4), most recently towards the end of 2023. She has reported good relief with previous kyphoplasties. Lastly, her most recent imaging in the hospital demonstrated a left breast mass, which is currently being worked up. Patient presents for management of multiple medical problems. REVIEW OF SYSTEMS: Refer to EMR PHYSICAL EXAMINATION: Refer to EMR ASSESSMENT AND PLAN: Refer to EMR Electronically Signed   By: Olive Bass M.D.   On: 05/02/2023 16:51    Microbiology: Results for orders placed or performed during the hospital encounter of 05/28/23  Blood culture (single)     Status: None (Preliminary result)   Collection Time: 05/28/23  5:05 AM   Specimen: BLOOD RIGHT ARM  Result Value Ref Range Status   Specimen Description BLOOD RIGHT ARM  Final   Special Requests BOTTLES DRAWN AEROBIC AND ANAEROBIC  Final   Culture   Final    NO GROWTH 2 DAYS Performed at [facility], [address] [place], [place] [address]    Report Status PENDING  Incomplete  Blood culture (single)     Status: None (Preliminary result)   Collection Time: 05/28/23  9:10 AM   Specimen: BLOOD  Result Value Ref Range Status   Specimen Description BLOOD RIGHT FA  Final   Special Requests   Final    BOTTLES DRAWN AEROBIC AND ANAEROBIC Blood Culture results may not be optimal due to an inadequate volume of blood  received in culture bottles   Culture   Final    NO GROWTH 2 DAYS Performed at [facility], [address] [place], [place] [address]    Report Status PENDING  Incomplete  MRSA Next Gen by PCR, Nasal     Status: None   Collection Time: 05/28/23  9:10 AM   Specimen: Nasal Mucosa; Nasal Swab  Result Value Ref Range Status   MRSA by PCR Next Gen NOT DETECTED NOT DETECTED Final    Comment: (NOTE) The GeneXpert MRSA Assay (FDA approved for NASAL specimens only), is one component of a comprehensive MRSA colonization surveillance program. It is not intended to diagnose MRSA infection nor to guide or monitor treatment for MRSA infections. Test performance is not FDA approved in patients less than 29 years old. Performed at [facility], [address] [place], [place] [address]     Labs: CBC: Recent Labs  Lab  05/28/23 0505 05/29/23 0352 05/30/23 0525  WBC 10.8* 6.0 6.3  NEUTROABS 9.3*  --  5.5  HGB 11.9* 9.4* 10.3*  HCT 36.0 28.6* 31.4*  MCV 98.4 98.3 101.0*  PLT 100* 61* 63*   Basic Metabolic Panel: Recent Labs  Lab 05/28/23 0505 05/29/23 0352 05/30/23 0525  NA 134* 134* 139  K 3.6 3.8 4.0  CL 104 109 108  CO2 23 21* 21*  GLUCOSE 184* 275* 190*  BUN 17 22 29*  CREATININE 1.17* 1.30* 1.60*  CALCIUM 9.4 8.0* 8.8*   Liver Function Tests: Recent Labs  Lab 05/28/23 0505 05/29/23 0352  AST 28 20  ALT 16 13  ALKPHOS 76 57  BILITOT 2.2* 0.9  PROT 7.0 5.6*  ALBUMIN 3.5 2.6*   CBG: Recent Labs  Lab 05/29/23 1151 05/29/23 1717 05/29/23 2112 05/30/23 0753 05/30/23 1211  GLUCAP 230* 223* 241* 137* 192*    Discharge time spent: greater than 30 minutes.  Signed: Enedina Finner, MD [facility] 05/30/2023

## 2023-05-31 ENCOUNTER — Encounter: Payer: Self-pay | Admitting: Oncology

## 2023-05-31 NOTE — Telephone Encounter (Signed)
Labs were drawn in the hospital so we dont need repeat labs at this time

## 2023-05-31 NOTE — Telephone Encounter (Signed)
Son aware that labs were drawn in hospital and appointment has been cancelled

## 2023-06-02 LAB — CULTURE, BLOOD (SINGLE)
Culture: NO GROWTH
Culture: NO GROWTH

## 2023-06-04 ENCOUNTER — Inpatient Hospital Stay: Payer: Medicare HMO

## 2023-06-04 ENCOUNTER — Telehealth: Payer: Self-pay | Admitting: *Deleted

## 2023-06-04 NOTE — Telephone Encounter (Signed)
Called the pt and got the voicemail and said that his ferritin is good at 136, and iron is 24 , saturation 11, hgb 10.3. Dr. Smith Robert suggested that she can get 3 IV iron of venofer if she wants to do it. Left my number to call me back

## 2023-06-07 DIAGNOSIS — I129 Hypertensive chronic kidney disease with stage 1 through stage 4 chronic kidney disease, or unspecified chronic kidney disease: Secondary | ICD-10-CM | POA: Diagnosis not present

## 2023-06-07 DIAGNOSIS — J9601 Acute respiratory failure with hypoxia: Secondary | ICD-10-CM | POA: Diagnosis not present

## 2023-06-07 DIAGNOSIS — D631 Anemia in chronic kidney disease: Secondary | ICD-10-CM | POA: Diagnosis not present

## 2023-06-07 DIAGNOSIS — E1122 Type 2 diabetes mellitus with diabetic chronic kidney disease: Secondary | ICD-10-CM | POA: Diagnosis not present

## 2023-06-07 DIAGNOSIS — I442 Atrioventricular block, complete: Secondary | ICD-10-CM | POA: Diagnosis not present

## 2023-06-07 DIAGNOSIS — D509 Iron deficiency anemia, unspecified: Secondary | ICD-10-CM | POA: Diagnosis not present

## 2023-06-07 DIAGNOSIS — E785 Hyperlipidemia, unspecified: Secondary | ICD-10-CM | POA: Diagnosis not present

## 2023-06-07 DIAGNOSIS — N1832 Chronic kidney disease, stage 3b: Secondary | ICD-10-CM | POA: Diagnosis not present

## 2023-06-07 DIAGNOSIS — M8008XD Age-related osteoporosis with current pathological fracture, vertebra(e), subsequent encounter for fracture with routine healing: Secondary | ICD-10-CM | POA: Diagnosis not present

## 2023-06-10 ENCOUNTER — Telehealth: Payer: Self-pay

## 2023-06-10 DIAGNOSIS — K1122 Acute recurrent sialoadenitis: Secondary | ICD-10-CM | POA: Diagnosis not present

## 2023-06-10 DIAGNOSIS — R197 Diarrhea, unspecified: Secondary | ICD-10-CM

## 2023-06-10 DIAGNOSIS — R109 Unspecified abdominal pain: Secondary | ICD-10-CM

## 2023-06-10 NOTE — Telephone Encounter (Signed)
Pt in office to reschedule appt from 04-30-2023.  Pt stated she is having some problems with her stomach.  Pt couldn't come to appts. Due to being in and out of hospital pt had other issue more pressing at the time now she need to be seen. I gave her 1st available but pt wants to know if we can fit her in at an earlier time ?

## 2023-06-11 NOTE — Telephone Encounter (Signed)
For last several days she has abdominal swelling. She states she has had no appetite at all she has to force her self to eat to keep her self alive. She states she has loss 20 pounds since October. When she eats she has a abdominal pain in her upper stomach that is sometimes sharp but aching that last for several hours. She states they tell her to drink a lot of water but she feels like every time she drinks or eat it goes right through her. She is having diarrhea during the day 5-8 times a day and during the night around 3. She states the diarrhea is  a  yellow color. Denies any blood in stool. She states for breakfast she tries to eat a little bit of oatmeal and then sometime during the day tries to drink a ensure high protein drink. Late evening she will eat different things usually what ever she feels like she might can eat. She states she really has not had nausea but a few times. She states her symptoms have been getting worse but have worsen in the last few weeks. Added patient to the weight list

## 2023-06-11 NOTE — Telephone Encounter (Signed)
Called and left a message for call back  

## 2023-06-12 NOTE — Telephone Encounter (Signed)
Patient verbalized understanding and she will [facility] [place] to have this done. She states she will try to go today but if not she will go tomorrow

## 2023-06-12 NOTE — Telephone Encounter (Signed)
She was admitted to [facility] in November 2024 secondary to acute left parotiditis, received antibiotics I am concerned about C. difficile Recommend CBC with differential, CMP, stool studies for GI profile PCR and C. difficile toxin A and B ASAP  RV

## 2023-06-12 NOTE — Addendum Note (Signed)
Addended by: Radene Knee L on: 06/12/2023 09:42 AM   Modules accepted: Orders

## 2023-06-14 NOTE — Progress Notes (Addendum)
Referring Physician:  Marina Goodell, MD [address] [place],  [place] [address]  Primary Physician:  Marina Goodell, MD  History of Present Illness: Emily Faulkner has a history of CKD, HTN, renal mass, osteoporosis, breast CA, renal CA, DM, and hyperlipidemia.   She has a PACEMAKER.   Last seen by me on 04/25/23 for acute T10 compression fracture with old fracture at T3 and previous kyphoplasty at T12 and L4.   She had T10 kyphoplasty done on 05/14/23 with Dr. Juliette Alcide. She had pain at her follow up with him on 05/23/23 and further xrays were done.   She was called in norco refill on 05/23/23. She is here for follow up.   She does not think she had improvement with kyphoplasty. She continues with constant mid back pain around her bra strap region. No lower TL pain. No radiation of pain into her ribs. No arm or leg pain. No numbness, tingling, or weakness.   No relief with wearing TLSO brace.   She is has a few norco for pain.   Bowel/Bladder Dysfunction: none  The symptoms are causing a significant impact on the patient's life.   Review of Systems:  A 10 point review of systems is negative, except for the pertinent positives and negatives detailed in the HPI.  Past Medical History: Past Medical History:  Diagnosis Date   Acute kidney failure (HCC) 11/16/2020   AKI (acute kidney injury) (HCC) 03/17/2018   Anemia    Cancer (HCC)    breast cancer right mastectomy   Diabetes mellitus without complication (HCC)    GIB (gastrointestinal bleeding) 02/20/2018   Glaucoma    Hyperkalemia 10/24/2020   Hyperlipidemia    Hypertension    Incontinence of bowel    Neuropathy    Odynophagia 10/24/2020   Osteoarthritis    Presence of permanent cardiac pacemaker    Rheumatoid arthritis (HCC)    Syncope and collapse 10/24/2020   Telangiectasias     Past Surgical History: Past Surgical History:  Procedure Laterality Date   BREAST SURGERY     right mastectomy    CHOLECYSTECTOMY     COLONOSCOPY     COLONOSCOPY WITH PROPOFOL N/A 08/14/2016   Procedure: COLONOSCOPY WITH PROPOFOL;  Surgeon: Christena Deem, MD;  Location: [facility] ENDOSCOPY;  Service: Endoscopy;  Laterality: N/A;   COLONOSCOPY WITH PROPOFOL N/A 02/22/2018   Procedure: COLONOSCOPY WITH PROPOFOL;  Surgeon: Wyline Mood, MD;  Location: [facility] ENDOSCOPY;  Service: Gastroenterology;  Laterality: N/A;   ENTEROSCOPY N/A 04/12/2021   Procedure: ENTEROSCOPY;  Surgeon: Toney Reil, MD;  Location: [facility] ENDOSCOPY;  Service: Gastroenterology;  Laterality: N/A;  push   ESOPHAGOGASTRODUODENOSCOPY (EGD) WITH PROPOFOL N/A 08/14/2016   Procedure: ESOPHAGOGASTRODUODENOSCOPY (EGD) WITH PROPOFOL;  Surgeon: Christena Deem, MD;  Location: [facility] ENDOSCOPY;  Service: Endoscopy;  Laterality: N/A;   ESOPHAGOGASTRODUODENOSCOPY (EGD) WITH PROPOFOL N/A 01/30/2018   Procedure: ESOPHAGOGASTRODUODENOSCOPY (EGD) WITH PROPOFOL;  Surgeon: Toledo, Boykin Nearing, MD;  Location: [facility] ENDOSCOPY;  Service: Gastroenterology;  Laterality: N/A;   ESOPHAGOGASTRODUODENOSCOPY (EGD) WITH PROPOFOL N/A 02/22/2018   Procedure: ESOPHAGOGASTRODUODENOSCOPY (EGD) WITH PROPOFOL;  Surgeon: Wyline Mood, MD;  Location: [facility] ENDOSCOPY;  Service: Gastroenterology;  Laterality: N/A;   EXCISIONAL HEMORRHOIDECTOMY     IR KYPHO THORACIC WITH BONE BIOPSY  05/14/2023   IR RADIOLOGIST EVAL & MGMT  08/07/2022   IR RADIOLOGIST EVAL & MGMT  09/12/2022   IR RADIOLOGIST EVAL & MGMT  05/02/2023   IR RADIOLOGIST EVAL & MGMT  05/02/2023   IR RADIOLOGIST EVAL & MGMT  05/23/2023   KYPHOPLASTY N/A 03/20/2018   Procedure: [id number];  Surgeon: Kennedy Bucker, MD;  Location: [facility] ORS;  Service: Orthopedics;  Laterality: N/A;   PACEMAKER IMPLANT N/A 10/26/2020   Procedure: PACEMAKER IMPLANT;  Surgeon: Marcina Millard, MD;  Location: [facility] INVASIVE CV LAB;  Service: Cardiovascular;  Laterality: N/A;   RADIOLOGY WITH ANESTHESIA N/A 08/31/2022   Procedure: CT renal microwave  ablation;  Surgeon: Pernell Dupre, MD;  Location: [facility] OR;  Service: Radiology;  Laterality: N/A;    Allergies: Allergies as of 06/19/2023   (No Known Allergies)    Medications: Outpatient Encounter Medications as of 06/19/2023  Medication Sig   ACCU-CHEK AVIVA PLUS test strip    ACCU-CHEK SOFTCLIX LANCETS lancets once daily Use as instructed.   acetaminophen (TYLENOL) 500 MG tablet Take 500 mg by mouth every 8 (eight) hours as needed for moderate pain (pain score 4-6) or mild pain (pain score 1-3).   alendronate (FOSAMAX) 70 MG tablet TAKE 1 TABLET BY MOUTH EVERY 7DAYS WITH A FULL GLASS OF WATER. DO NOT LIE DOWN FOR 30 MINUTES   Biotin 1000 MCG tablet Take 1,000 mcg by mouth daily.   Calcium Carb-Cholecalciferol (OYSTER SHELL CALCIUM W/D) 500-5 MG-MCG TABS Take 1 tablet by mouth daily.   Cholecalciferol 50 MCG (2000 UT) CAPS Take 2,000 Units by mouth daily.   feeding supplement, ENSURE ENLIVE, (ENSURE ENLIVE) LIQD Take 237 mLs by mouth 2 (two) times daily between meals.   HYDROcodone-acetaminophen (NORCO) 5-325 MG tablet Take 1 tablet by mouth every 8 (eight) hours as needed for severe pain (pain score 7-10).   insulin glargine (LANTUS) 100 UNIT/ML Solostar Pen Inject 15 Units into the skin daily. (may take up to 50u based upon blood glucose reading) (Patient taking differently: Inject 28 Units into the skin daily. (may take up to 50u based upon blood glucose reading))   Insulin Pen Needle 32G X 4 MM MISC USE 1 NEEDLE SUBCUTANEOUSLY ONCE A DAY AS DIRECTED   losartan (COZAAR) 50 MG tablet Take 25 mg by mouth daily.   niacin 500 MG CR capsule Take 500 mg by mouth at bedtime.   omeprazole (PRILOSEC) 40 MG capsule TAKE 1 CAPSULE BY MOUTH EVERY DAY BEFORE BREAKFAST   Probiotic Product (PROBIOTIC PO) Take 1 tablet by mouth daily. 2 billion   simvastatin (ZOCOR) 20 MG tablet Take 20 mg by mouth at bedtime.   sitaGLIPtin (JANUVIA) 50 MG tablet Take 50 mg by mouth daily.   TRAVATAN Z 0.004 %  SOLN ophthalmic solution Place 1 drop into both eyes at bedtime.   TURMERIC PO Take 538 mg by mouth daily.   vitamin B-12 (CYANOCOBALAMIN) 1000 MCG tablet Take 2,000 mcg by mouth daily.   No facility-administered encounter medications on file as of 06/19/2023.    Social History: Social History   Tobacco Use   Smoking status: Former    Current packs/day: 0.00    Types: Cigarettes    Quit date: 07/10/2011    Years since quitting: 11.9   Smokeless tobacco: Never  Vaping Use   Vaping status: Never Used  Substance Use Topics   Alcohol use: No   Drug use: No    Family Medical History: Family History  Problem Relation Age of Onset   Kidney cancer Son    Bladder Cancer Neg Hx    Breast cancer Neg Hx     Physical Examination: There were no vitals filed for this visit.  Awake, alert, oriented to person, place, and time.  Speech is clear and fluent. Fund of knowledge is appropriate.   Cranial Nerves: Pupils equal round and reactive to light.  Facial tone is symmetric.    She has tenderness over mid thoracic area (bra strap).  No abnormal lesions on exposed skin.   Strength: Side Biceps Triceps Deltoid Interossei Grip Wrist Ext. Wrist Flex.  R 5 5 5 5 5 5 5   L 5 5 5 5 5 5 5    Side Iliopsoas Quads Hamstring PF DF EHL  R 5 5 5 5 5 5   L 5 5 5 5 5 5    Reflexes are 2+ and symmetric at the biceps, brachioradialis, patella and achilles.   Hoffman's is absent.  Clonus is not present.   Bilateral upper and lower extremity sensation is intact to light touch.  She is in a WC. Gait not tested.   Medical Decision Making  Imaging: Thoracic and lumbar xrays dated 05/23/23:  FINDINGS: Thoracic spine:   Gentle sigmoid scoliotic curvature of the thoracolumbar spine. Interval T10 vertebral body augmentation without complicating feature. Stable chronic changes of T12 vertebral body augmentation. Similar chronic wedging involving the inferior endplate of the T11 vertebral body. No  new compression fracture identified. Osteopenia. Implantable cardiac device is present. Atherosclerosis.   Lumbar spine:   Gentle sigmoid scoliotic curvature of the thoracolumbar spine. Stable chronic changes of L4 vertebral body augmentation. No new compression fracture identified. Multilevel degenerative changes with severe disc space disease at L2-L3. Facet arthropathy more pronounced in the lower lumbar spine. Atherosclerosis.   IMPRESSION: Thoracic, lumbar spine:   1. Interval T10 vertebral body augmentation without complicating feature. No new compression fracture identified. 2. Similar chronic changes of additional vertebral body augmentation at T12 and L4. 3. Multilevel degenerative changes most pronounced at L2-L3 with severe disc space disease.     Electronically Signed   By: Olive Bass M.D.   On: 05/23/2023 14:22  I have personally reviewed the images and agree with the above interpretation.   Xrays reviewed with Dr. Katrinka Blazing and he thinks she may have T8 compression fracture.   Assessment and Plan: Emily Faulkner does not think she had improvement with kyphoplasty at T10. She continues with constant mid back pain around her bra strap region. No lower TL pain. No radiation of pain into her ribs. No arm or leg pain. No numbness, tingling, or weakness.   Xrays reviewed with Dr. Katrinka Blazing. He thinks she  may have compression at T8. She is unable to have MRI as she has a pacemaker.   Treatment options discussed with patient and her son. Following plan made:   - Dr. Juliette Alcide recommended a 3D bone scan to rule out radiographically occult fracture (cannot have MRI due to pacemaker). Message to him regarding this (I'm not sure how to order).  - If bone scan does not show new fracture, then consider referral to pain management to discuss injections and/or PT.  - Will set up phone visit to review bone scan results once I have them back.  - Continue prn hydrocodone- she takes rarely.  Refill given to have on hand. PMP reviewed and is appropriate. Reviewed dosing and side effects.  - Recommend she continue to avoid bending, twisting, or lifting. Does not need to wear TLSO brace (makes her pain worse).   ADDENDUM 06/21/23:  Dr. Juliette Alcide is no longer with [facility]. Discussed with Dr. Ernestina Penna with IR and he recommends bone scan with SPECT/CT to  evaluate for acute compression fracture. This was ordered.   I spent a total of 20 minutes in face-to-face and non-face-to-face activities related to this patient's care today including review of outside records, review of imaging, review of symptoms, physical exam, discussion of differential diagnosis, discussion of treatment options, and documentation.   Drake Leach PA-C Dept. of Neurosurgery

## 2023-06-17 DIAGNOSIS — R197 Diarrhea, unspecified: Secondary | ICD-10-CM | POA: Diagnosis not present

## 2023-06-17 DIAGNOSIS — R109 Unspecified abdominal pain: Secondary | ICD-10-CM | POA: Diagnosis not present

## 2023-06-17 NOTE — Telephone Encounter (Signed)
Called patient and left a detail message to inform patient to go for labs as soon as she can because it does not look like she has went for them yet

## 2023-06-18 LAB — COMPREHENSIVE METABOLIC PANEL
ALT: 16 [IU]/L (ref 0–32)
AST: 33 [IU]/L (ref 0–40)
Albumin: 3.8 g/dL (ref 3.7–4.7)
Alkaline Phosphatase: 103 [IU]/L (ref 44–121)
BUN/Creatinine Ratio: 7 — ABNORMAL LOW (ref 12–28)
BUN: 8 mg/dL (ref 8–27)
Bilirubin Total: 1.1 mg/dL (ref 0.0–1.2)
CO2: 20 mmol/L (ref 20–29)
Calcium: 9.9 mg/dL (ref 8.7–10.3)
Chloride: 108 mmol/L — ABNORMAL HIGH (ref 96–106)
Creatinine, Ser: 1.18 mg/dL — ABNORMAL HIGH (ref 0.57–1.00)
Globulin, Total: 2.8 g/dL (ref 1.5–4.5)
Glucose: 163 mg/dL — ABNORMAL HIGH (ref 70–99)
Potassium: 3.6 mmol/L (ref 3.5–5.2)
Sodium: 143 mmol/L (ref 134–144)
Total Protein: 6.6 g/dL (ref 6.0–8.5)
eGFR: 46 mL/min/{1.73_m2} — ABNORMAL LOW (ref >59)

## 2023-06-18 LAB — CBC WITH DIFFERENTIAL/PLATELET
Basophils Absolute: 0 10*3/uL (ref 0.0–0.2)
Basos: 1 %
EOS (ABSOLUTE): 0.2 10*3/uL (ref 0.0–0.4)
Eos: 3 %
Hematocrit: 37.8 % (ref 34.0–46.6)
Hemoglobin: 12.1 g/dL (ref 11.1–15.9)
Immature Grans (Abs): 0 10*3/uL (ref 0.0–0.1)
Immature Granulocytes: 0 %
Lymphocytes Absolute: 0.7 10*3/uL (ref 0.7–3.1)
Lymphs: 14 %
MCH: 32.1 pg (ref 26.6–33.0)
MCHC: 32 g/dL (ref 31.5–35.7)
MCV: 100 fL — ABNORMAL HIGH (ref 79–97)
Monocytes Absolute: 0.5 10*3/uL (ref 0.1–0.9)
Monocytes: 11 %
Neutrophils Absolute: 3.3 10*3/uL (ref 1.4–7.0)
Neutrophils: 71 %
Platelets: 124 10*3/uL — ABNORMAL LOW (ref 150–450)
RBC: 3.77 x10E6/uL (ref 3.77–5.28)
RDW: 14.3 % (ref 11.7–15.4)
WBC: 4.7 10*3/uL (ref 3.4–10.8)

## 2023-06-19 ENCOUNTER — Encounter: Payer: Self-pay | Admitting: Orthopedic Surgery

## 2023-06-19 ENCOUNTER — Ambulatory Visit: Payer: Medicare HMO | Admitting: Orthopedic Surgery

## 2023-06-19 ENCOUNTER — Telehealth: Payer: Self-pay

## 2023-06-19 VITALS — BP 136/64 | Ht 64.0 in | Wt 148.0 lb

## 2023-06-19 DIAGNOSIS — M546 Pain in thoracic spine: Secondary | ICD-10-CM

## 2023-06-19 DIAGNOSIS — S22070D Wedge compression fracture of T9-T10 vertebra, subsequent encounter for fracture with routine healing: Secondary | ICD-10-CM | POA: Diagnosis not present

## 2023-06-19 MED ORDER — HYDROCODONE-ACETAMINOPHEN 5-325 MG PO TABS
1.0000 | ORAL_TABLET | Freq: Three times a day (TID) | ORAL | 0 refills | Status: DC | PRN
Start: 2023-06-19 — End: 2023-09-24

## 2023-06-19 NOTE — Telephone Encounter (Signed)
Patient verbalized understanding of results  

## 2023-06-19 NOTE — Patient Instructions (Signed)
It was so nice to see you today. Thank you so much for coming in.    I want to get a bone scan to make sure you don't have a new compression fracture. I will order this and they will call you to schedule. Will get it done in [place] if possible.   Once I have the results back, we will set up a phone visit to review them.   I sent a refill of your hydrocodone to take for severe pain. Take only as needed and remember this can make you sleepy and/or constipated.   Please do not hesitate to call if you have any questions or concerns. You can also message me in MyChart.   Drake Leach PA-C [phone]     The physicians and staff at [facility] are committed to providing excellent care. You may receive a survey asking for feedback about your experience at our office. We value you your feedback and appreciate you taking the time to to fill it out. The [facility] leadership team is also available to discuss your experience in person, feel free to contact us [phone].

## 2023-06-19 NOTE — Telephone Encounter (Signed)
-----   Message from [facility] sent at 06/18/2023  4:26 PM EST ----- I am very pleased to see that her blood test results look reassuring including her blood counts, kidney function and liver tests Waiting on the stool study results  RV

## 2023-06-21 NOTE — Addendum Note (Signed)
Addended byDrake Leach on: 06/21/2023 12:36 PM   Modules accepted: Orders

## 2023-06-25 ENCOUNTER — Ambulatory Visit
Admission: RE | Admit: 2023-06-25 | Discharge: 2023-06-25 | Disposition: A | Discharge status: Home / Self Care | Payer: Medicare HMO | Source: Ambulatory Visit | Attending: Orthopedic Surgery | Admitting: Orthopedic Surgery

## 2023-06-25 ENCOUNTER — Encounter
Admission: RE | Admit: 2023-06-25 | Discharge: 2023-06-25 | Discharge status: Home / Self Care | Payer: Medicare HMO | Source: Ambulatory Visit | Attending: Orthopedic Surgery | Admitting: Orthopedic Surgery

## 2023-06-25 ENCOUNTER — Other Ambulatory Visit: Payer: Medicare HMO

## 2023-06-25 DIAGNOSIS — M549 Dorsalgia, unspecified: Secondary | ICD-10-CM | POA: Diagnosis not present

## 2023-06-25 DIAGNOSIS — M546 Pain in thoracic spine: Secondary | ICD-10-CM | POA: Diagnosis not present

## 2023-06-25 DIAGNOSIS — M4854XA Collapsed vertebra, not elsewhere classified, thoracic region, initial encounter for fracture: Secondary | ICD-10-CM | POA: Diagnosis not present

## 2023-06-25 DIAGNOSIS — S22070D Wedge compression fracture of T9-T10 vertebra, subsequent encounter for fracture with routine healing: Secondary | ICD-10-CM | POA: Diagnosis not present

## 2023-06-25 MED ORDER — TECHNETIUM TC 99M MEDRONATE IV KIT
20.0000 | PACK | Freq: Once | INTRAVENOUS | Status: AC | PRN
  Administered 2023-06-25: 22.97 via INTRAVENOUS

## 2023-06-26 ENCOUNTER — Other Ambulatory Visit: Payer: Medicare HMO

## 2023-07-11 ENCOUNTER — Encounter: Payer: Self-pay | Admitting: Oncology

## 2023-07-15 ENCOUNTER — Encounter: Payer: Self-pay | Admitting: Orthopedic Surgery

## 2023-07-15 ENCOUNTER — Telehealth: Payer: Self-pay | Admitting: Orthopedic Surgery

## 2023-07-15 ENCOUNTER — Other Ambulatory Visit: Payer: Self-pay

## 2023-07-15 NOTE — Progress Notes (Addendum)
 Bone scan with CT dated 06/25/23:  FINDINGS: There is radiotracer activity at the T10 vertebral body level. Compression fracture vertebroplasty augmentation at this level.   There is focus intense radiotracer activity in the T9 vertebral body no loss vertebral body height. There is sclerosis within this vertebral body seen on sagittal series image 367 and axial series image 107.   IMPRESSION: 1. Radiotracer activity localizing to the T9 vertebral body. No CT evidence of fracture. There is sclerosis within this bone. Patient with history of renal cell carcinoma and potential breast mass by history. Differential would include occult fracture versus metastatic lesion. Consider MRI of the thoracic spine. 2. Uptake in the T10 vertebral body level associated with compression fraction and vertebroplasty augmentation.     Electronically Signed   By: Jackquline Boxer M.D.   On: 07/11/2023 10:03  I am going to review results with IR to see what they think about T9. Message sent to Dr. Merriam. Will get back to patient as soon as I hear back.   ADDENDUM 07/15/23:  Patient reviewed with Dr. Merriam. He thinks she has a subtle fracture of the posterior inferior corner of the T9 vertebral body with sclerosis adjacent to the inferior endplate likely representing microtrabecular fracture without significant height loss.   He recommends treating conservatively for now but if she has persistent or worsening pain, then he would repeat the CT to look for increasing height loss. If sufficient height loss, could consider kypho. He thinks Pain management referral is reasonable at this time.

## 2023-07-15 NOTE — Telephone Encounter (Signed)
 Please let her know I finally got her bone scan results back. I want to review with interventional radiology and will let her know as soon as I hear back from them.

## 2023-07-16 ENCOUNTER — Ambulatory Visit: Payer: Medicare HMO | Admitting: Gastroenterology

## 2023-07-16 ENCOUNTER — Encounter: Payer: Self-pay | Admitting: Gastroenterology

## 2023-07-16 ENCOUNTER — Other Ambulatory Visit: Payer: Self-pay

## 2023-07-16 ENCOUNTER — Telehealth: Payer: Self-pay

## 2023-07-16 VITALS — BP 136/70 | HR 112 | Temp 97.7°F | Ht 64.0 in | Wt 138.1 lb

## 2023-07-16 DIAGNOSIS — K529 Noninfective gastroenteritis and colitis, unspecified: Secondary | ICD-10-CM

## 2023-07-16 DIAGNOSIS — K2940 Chronic atrophic gastritis without bleeding: Secondary | ICD-10-CM

## 2023-07-16 DIAGNOSIS — K7460 Unspecified cirrhosis of liver: Secondary | ICD-10-CM | POA: Diagnosis not present

## 2023-07-16 DIAGNOSIS — R161 Splenomegaly, not elsewhere classified: Secondary | ICD-10-CM | POA: Diagnosis not present

## 2023-07-16 DIAGNOSIS — Z8619 Personal history of other infectious and parasitic diseases: Secondary | ICD-10-CM | POA: Diagnosis not present

## 2023-07-16 DIAGNOSIS — K3189 Other diseases of stomach and duodenum: Secondary | ICD-10-CM

## 2023-07-16 DIAGNOSIS — R197 Diarrhea, unspecified: Secondary | ICD-10-CM

## 2023-07-16 DIAGNOSIS — D500 Iron deficiency anemia secondary to blood loss (chronic): Secondary | ICD-10-CM

## 2023-07-16 NOTE — Telephone Encounter (Signed)
 Let's reorder the tests if she is still having diarrhea  RV

## 2023-07-16 NOTE — Telephone Encounter (Signed)
 Reorder test today at appointment

## 2023-07-16 NOTE — Telephone Encounter (Signed)
 Lee from [facility] called back and asked if we order the test. Informed him yes. He asked if we could fax him the Requisition form and he will see If they can still run the stool test. He will call us back any way. Faxed to [phone]

## 2023-07-16 NOTE — Telephone Encounter (Signed)
 Nedra Hai called back and states they will not be able to perform the tests because the samples have been thrown away.  She will have to recollect if you want them still

## 2023-07-16 NOTE — Progress Notes (Signed)
 Telephone Visit- Progress Note: Referring Physician:  Jeffie Cheryl BRAVO, MD [address] [place],  [place] [address]  Primary Physician:  Jeffie Cheryl BRAVO, MD  This visit was performed via telephone.  Patient location: home Provider location: office  I spent a total of 15 minutes non-face-to-face activities for this visit on the date of this encounter including review of current clinical condition and response to treatment.    Patient has given verbal consent to this telephone visits and we reviewed the limitations of a telephone visit. Patient wishes to proceed.    Chief Complaint:  follow up  History of Present Illness: Emily Faulkner is a 83 y.o. female has a history of CKD, HTN, renal mass, osteoporosis, breast CA, renal CA, DM, and hyperlipidemia.    She has a PACEMAKER.    Last seen by me on 06/19/23 for follow up after T10 kyphoplasty done on 05/14/23 with Dr. Gar. She had no improvement with procedure.   IR recommended bone scan with CT to evaluate occult fracture since she was not able to have MRI (pacemaker).   Phone visit scheduled to review bone scan results.   She is a little better than she was at her last visit. She has constant mid back pain around her bra strap region. She has minimal pain with siting. Can sleep in her lift chair. Worse pain is with getting up from seating position and twisting. No lower TL pain. No radiation of pain into her ribs. No arm or leg pain. No numbness, tingling, or weakness.    She is has a few norco for pain. I gave her 10 at her last visit and she has 5 left. She only takes if pain is severe.    Bowel/Bladder Dysfunction: none  Exam: No exam done as this was a telephone encounter.     Imaging: NM Bone W/Spect CT dated 06/25/23:  FINDINGS: There is radiotracer activity at the T10 vertebral body level. Compression fracture vertebroplasty augmentation at this level.   There is focus intense radiotracer activity in the  T9 vertebral body no loss vertebral body height. There is sclerosis within this vertebral body seen on sagittal series image 367 and axial series image 107.   IMPRESSION: 1. Radiotracer activity localizing to the T9 vertebral body. No CT evidence of fracture. There is sclerosis within this bone. Patient with history of renal cell carcinoma and potential breast mass by history. Differential would include occult fracture versus metastatic lesion. Consider MRI of the thoracic spine. 2. Uptake in the T10 vertebral body level associated with compression fraction and vertebroplasty augmentation.     Electronically Signed   By: Jackquline Boxer M.D.   On: 07/11/2023 10:03  I have personally reviewed the images and agree with the above interpretation.  Assessment and Plan: Ms. Riche is a little better than she was at her last visit. She has constant mid back pain around her bra strap region, but she now has minimal pain with siting. Can sleep in her lift chair. Worse pain is with getting up from seating position and twisting. No lower TL pain. No radiation of pain into her ribs. No arm or leg pain. No numbness, tingling, or weakness.   Above bone scan reviewed with Dr. Merriam (IR). He thinks she has a subtle fracture of the posterior inferior corner of the T9 vertebral body with sclerosis adjacent to the inferior endplate likely representing microtrabecular fracture without significant height loss.   Treatment options discussed with  patient. Following plan made:   - Recommend conservative treatment for possible T9 fracture for now. Pain may also be from underlying spondylosis/DDD.  - No bending, twisting, or lifting.  - Recommend she wear TLSO brace when up and out of bed.  - Referral to pain management ([facility]).  - Will get updated thoracic xrays at her follow up in 4-5 weeks.  - If pain gets worse or if she has significant height loss at T9, then could consider referral back to IR for  kyphoplasty.   Glade Boys PA-C Neurosurgery

## 2023-07-16 NOTE — Telephone Encounter (Signed)
 Called [facility] to check on the stool test results from 06/11/2024 for the Cdif and the GI profile. They said they were only able to do a stool culture because the test was not preformed right. I asked did she not collect the stool sample right or what happen. He states it was sent back with out a requisition form so they did not know what test to perform. Informed them why did the [place] drawing stating send it with out the orders. He checked the log for 06/17/2024 and she did not sign In. He said she had to just dropped it off at the front counter and left. She did not hand it to any one. He will be faxing the results for the stool culture

## 2023-07-16 NOTE — Progress Notes (Signed)
 [name], MD [address]  [address]  [place], [place] [address]  Main: [phone]  Fax: [phone]    Gastroenterology Consultation  Referring Provider:     Jeffie Cheryl BRAVO, MD Primary Care Physician:  Jeffie Cheryl BRAVO, MD Primary Gastroenterologist:  Dr. Corinn Brooklyn Reason for Consultation:     Chronic diarrhea        HPI:   Emily Faulkner is a 83 y.o. female referred by Dr. Jeffie Cheryl BRAVO, MD  for consultation & management of chronic iron  deficiency anemia.  Patient is functionally independent, chronic iron  deficiency anemia secondary to small bowel AVMs, diabetes, hypertension, hyperlipidemia, compensated cirrhosis of liver is seen in consultation for recent flareup of diarrhea.  Patient has history of chronic diarrhea, underwent colonoscopy with random colon biopsies which were unremarkable.  Pancreatic fecal elastase levels were normal.  She was admitted to [facility] in first week of December 2024 secondary to parotitis, treated with antibiotics.  Since then, she reports having recurrence of diarrhea, occurring about 4-5 times daily and about twice at night associated with abdominal cramps and urgency.  She is not able to go out, affecting her quality of life.  She denies any abdominal bloating.  She reports loss of appetite, has been losing weight.  She denies any fever, chills, reports nausea.  She did notice some blood in the stools last night when she had 2 episodes of diarrhea.  Sometimes with large volume. Patient is accompanied by her son. I recommended stool studies when she called our office.  Stool culture came back negative.  However, stool specimen for GI profile PCR and C. difficile toxin were not processed by the lab.  She denies any change in her current medications other than recent use of antibiotics for parotitis.  Her diabetes is fairly under control, HbA1c 7.5.  She denies any swelling of legs, abdominal pain   NSAIDs: None    Antiplts/Anticoagulants/Anti thrombotics: Aspirin  81  GI Procedures:  04/12/2021 small bowel endoscopy - Multiple non-bleeding angioectasias in the jejunum. Treated with argon plasma coagulation (APC). - Normal duodenal bulb, second portion of the duodenum, third portion of the duodenum and fourth portion of the duodenum. - Erosive gastropathy with stigmata of recent bleeding. Biopsied. DIAGNOSIS:  A. STOMACH; COLD BIOPSY:  - CHRONIC ATROPHIC GASTRITIS WITH FOCAL ACTIVE INFLAMMATION.  - NEGATIVE FOR HELICOBACTER PYLORI BY IMMUNOHISTOCHEMISTRY.  - NEGATIVE FOR INTESTINAL METAPLASIA, DYSPLASIA, AND MALIGNANCY.   Small bowel endoscopy 02/22/2018 - Three non-bleeding angioectasias in the jejunum. Treated with argon plasma coagulation (APC). - Normal esophagus. - Normal stomach. - No specimens collected.  Colonoscopy 02/22/2018 - Preparation of the colon was fair. - Multiple bleeding colonic angioectasias. Treated with argon plasma coagulation (APC). - The examination was otherwise normal on direct and retroflexion views. - No specimens collected.  EGD 01/30/2018 by Dr. Aundria - Widely patent and non-obstructing Schatzki ring. - Portal hypertensive gastropathy. - Normal examined duodenum. - The examination was otherwise normal. - No specimens collected.  EGD and colonoscopy 08/2016 by Dr. Gaylyn for iron  deficiency anemia AVMs, nonbleeding in ascending colon were detected, not treated DIAGNOSIS:  A. STOMACH, ANTRUM AND BODY; COLD BIOPSY:  - ANTRAL AND OXYNTIC MUCOSA WITH MODERATE CHRONIC ACTIVE GASTRITIS,  HELICOBACTER PYLORI ASSOCIATED.  - NEGATIVE FOR DYSPLASIA AND MALIGNANCY.   B.  GE JUNCTION; COLD BIOPSY:  - REFLUX GASTROESOPHAGITIS.  - NEGATIVE FOR GOBLET CELLS, DYSPLASIA AND MALIGNANCY.   C. COLON, RANDOM RIGHT; COLD BIOPSY:  - NO  PATHOLOGIC CHANGE.   D. COLON, RANDOM LEFT; COLD BIOPSY:  - NO PATHOLOGIC CHANGE.   E.  RECTUM POLYP; COLD BIOPSY:  - POLYPOID  SQUAMOCOLUMNAR (ANORECTAL) MUCOSA.  - PROMINENT LYMPHOID AGGREGATE.  - NEGATIVE FOR DYSPLASIA AND MALIGNANCY.  - MULTIPLE DEEPER LEVELS WERE EXAMINED.   Past Medical History:  Diagnosis Date  . Acute kidney failure (HCC) 11/16/2020  . AKI (acute kidney injury) (HCC) 03/17/2018  . Anemia   . [facility])    breast cancer right mastectomy  . Diabetes mellitus without complication (HCC)   . GIB (gastrointestinal bleeding) 02/20/2018  . Glaucoma   . Hyperkalemia 10/24/2020  . Hyperlipidemia   . Hypertension   . Incontinence of bowel   . Neuropathy   . Odynophagia 10/24/2020  . Osteoarthritis   . Presence of permanent cardiac pacemaker   . Rheumatoid arthritis (HCC)   . Syncope and collapse 10/24/2020  . Telangiectasias     Past Surgical History:  Procedure Laterality Date  . BREAST SURGERY     right mastectomy  . CHOLECYSTECTOMY    . COLONOSCOPY    . COLONOSCOPY WITH PROPOFOL  N/A 08/14/2016   Procedure: COLONOSCOPY WITH PROPOFOL ;  Surgeon: Gladis RAYMOND Mariner, MD;  Location: [facility] ENDOSCOPY;  Service: Endoscopy;  Laterality: N/A;  . COLONOSCOPY WITH PROPOFOL  N/A 02/22/2018   Procedure: COLONOSCOPY WITH PROPOFOL ;  Surgeon: Therisa Bi, MD;  Location[facility] ENDOSCOPY;  Service: Gastroenterology;  Laterality: N/A;  . ENTEROSCOPY N/A 04/12/2021   Procedure: ENTEROSCOPY;  Surgeon: Unk Corinn Skiff, MD;  Location: [facility] ENDOSCOPY;  Service: Gastroenterology;  Laterality: N/A;  push  . ESOPHAGOGASTRODUODENOSCOPY (EGD) WITH PROPOFOL  N/A 08/14/2016   Procedure: ESOPHAGOGASTRODUODENOSCOPY (EGD) WITH PROPOFOL ;  Surgeon: Gladis RAYMOND Mariner, MD;  Location: [facility] ENDOSCOPY;  Service: Endoscopy;  Laterality: N/A;  . ESOPHAGOGASTRODUODENOSCOPY (EGD) WITH PROPOFOL  N/A 01/30/2018   Procedure: ESOPHAGOGASTRODUODENOSCOPY (EGD) WITH PROPOFOL ;  Surgeon: Toledo, Ladell POUR, MD;  Location: [facility] ENDOSCOPY;  Service: Gastroenterology;  Laterality: N/A;  . ESOPHAGOGASTRODUODENOSCOPY (EGD) WITH PROPOFOL  N/A  02/22/2018   Procedure: ESOPHAGOGASTRODUODENOSCOPY (EGD) WITH PROPOFOL ;  Surgeon: Therisa Bi, MD;  Location: [facility] ENDOSCOPY;  Service: Gastroenterology;  Laterality: N/A;  . EXCISIONAL HEMORRHOIDECTOMY    . IR KYPHO THORACIC WITH BONE BIOPSY  05/14/2023  . IR RADIOLOGIST EVAL & MGMT  08/07/2022  . IR RADIOLOGIST EVAL & MGMT  09/12/2022  . IR RADIOLOGIST EVAL & MGMT  05/02/2023  . IR RADIOLOGIST EVAL & MGMT  05/02/2023  . IR RADIOLOGIST EVAL & MGMT  05/23/2023  . KYPHOPLASTY N/A 03/20/2018   Procedure: [id number];  Surgeon: Kathlynn Sharper, MD;  Location: [facility] ORS;  Service: Orthopedics;  Laterality: N/A;  . PACEMAKER IMPLANT N/A 10/26/2020   Procedure: PACEMAKER IMPLANT;  Surgeon: Ammon Blunt, MD;  Location: [facility] INVASIVE CV LAB;  Service: Cardiovascular;  Laterality: N/A;  . RADIOLOGY WITH ANESTHESIA N/A 08/31/2022   Procedure: CT renal microwave ablation;  Surgeon: Gar Felton PARAS, MD;  Location: [facility] OR;  Service: Radiology;  Laterality: N/A;    Current Outpatient Medications:  .  ACCU-CHEK AVIVA PLUS test strip, , Disp: , Rfl:  .  ACCU-CHEK SOFTCLIX LANCETS lancets, once daily Use as instructed., Disp: , Rfl:  .  alendronate (FOSAMAX) 70 MG tablet, TAKE 1 TABLET BY MOUTH EVERY 7DAYS WITH A FULL GLASS OF WATER . DO NOT LIE DOWN FOR 30 MINUTES, Disp: , Rfl:  .  Biotin 1000 MCG tablet, Take 1,000 mcg by mouth daily., Disp: , Rfl:  .  Calcium  Carb-Cholecalciferol  (OYSTER SHELL CALCIUM  W/D) 500-5 MG-MCG  TABS, Take 1 tablet by mouth daily., Disp: , Rfl:  .  Cholecalciferol  50 MCG (2000 UT) CAPS, Take 2,000 Units by mouth daily., Disp: , Rfl:  .  feeding supplement, ENSURE ENLIVE, (ENSURE ENLIVE) LIQD, Take 237 mLs by mouth 2 (two) times daily between meals., Disp: 237 mL, Rfl: 12 .  HYDROcodone -acetaminophen  (NORCO) 5-325 MG tablet, Take 1 tablet by mouth every 8 (eight) hours as needed for severe pain (pain score 7-10)., Disp: 10 tablet, Rfl: 0 .  insulin  glargine (LANTUS ) 100 UNIT/ML  Solostar Pen, Inject 15 Units into the skin daily. (may take up to 50u based upon blood glucose reading) (Patient taking differently: Inject 28 Units into the skin daily. (may take up to 50u based upon blood glucose reading)), Disp: 15 mL, Rfl: 11 .  Insulin  Pen Needle 32G X 4 MM MISC, USE 1 NEEDLE SUBCUTANEOUSLY ONCE A DAY AS DIRECTED, Disp: , Rfl:  .  losartan  (COZAAR ) 50 MG tablet, Take 25 mg by mouth daily., Disp: , Rfl:  .  niacin  500 MG CR capsule, Take 500 mg by mouth at bedtime., Disp: , Rfl:  .  omeprazole  (PRILOSEC) 40 MG capsule, TAKE 1 CAPSULE BY MOUTH EVERY DAY BEFORE BREAKFAST, Disp: 30 capsule, Rfl: 0 .  Probiotic Product (PROBIOTIC PO), Take 1 tablet by mouth daily. 2 billion, Disp: , Rfl:  .  simvastatin  (ZOCOR ) 20 MG tablet, Take 20 mg by mouth at bedtime., Disp: , Rfl: 6 .  sitaGLIPtin (JANUVIA) 50 MG tablet, Take 50 mg by mouth daily., Disp: , Rfl:  .  TRAVATAN Z 0.004 % SOLN ophthalmic solution, Place 1 drop into both eyes at bedtime., Disp: , Rfl:  .  TURMERIC PO, Take 538 mg by mouth daily., Disp: , Rfl:  .  vitamin B-12 (CYANOCOBALAMIN ) 1000 MCG tablet, Take 2,000 mcg by mouth daily., Disp: , Rfl:    Family History  Problem Relation Age of Onset  . Kidney cancer Son   . Bladder Cancer Neg Hx   . Breast cancer Neg Hx      Social History   Tobacco Use  . Smoking status: Former    Current packs/day: 0.00    Types: Cigarettes    Quit date: 07/10/2011    Years since quitting: 12.0  . Smokeless tobacco: Never  Vaping Use  . Vaping status: Never Used  Substance Use Topics  . Alcohol use: No  . Drug use: No    Allergies as of 07/16/2023  . (No Known Allergies)    Review of Systems:    All systems reviewed and negative except where noted in HPI.   Physical Exam:  BP 136/70 (BP Location: Left Arm, Patient Position: Sitting, Cuff Size: Normal)   Pulse (!) 112   Temp 97.7 F (36.5 C) (Oral)   Ht 5' 4 (1.626 m)   Wt 138 lb 2 oz (62.7 kg)   BMI 23.71 kg/m   No LMP recorded. Patient is postmenopausal.  General:   Alert,  Well-developed, well-nourished, pleasant and cooperative in NAD Head:  Normocephalic and atraumatic. Eyes:  Sclera clear, no icterus.   Conjunctiva pink. Ears:  Normal auditory acuity. Nose:  No deformity, discharge, or lesions. Mouth:  No deformity or lesions,oropharynx pink & moist. Neck:  Supple; no masses or thyromegaly. Lungs:  Respirations even and unlabored.  Clear throughout to auscultation.   No wheezes, crackles, or rhonchi. No acute distress. Heart:  Regular rate and rhythm; no murmurs, clicks, rubs, or gallops. Abdomen:  Normal bowel  sounds. Soft, nontender, nondistended, without masses, hepatosplenomegaly or hernias noted.  No guarding or rebound tenderness.   Rectal: Not performed Msk:  Symmetrical without gross deformities. Good, equal movement & strength bilaterally. Pulses:  Normal pulses noted. Extremities:  No clubbing or edema.  No cyanosis. Neurologic:  Alert and oriented x3;  grossly normal neurologically. Skin:  Intact without significant lesions or rashes. No jaundice. Psych:  Alert and cooperative. Normal mood and affect.  Imaging Studies: Reviewed  Assessment and Plan:   Emily Faulkner is a 83 y.o. Caucasian female with metabolic syndrome, well compensated cirrhosis, probably NASH in etiology, chronic iron  deficiency anemia secondary to chronic blood loss from small bowel and colonic AVMs.  She underwent small bowel endoscopy and colonoscopy, AVMs in jejunum, AC and cecum that were detected were cauterized History of C. difficile infection in 10/2020 s/p 10 days course of oral vancomycin   Acute on chronic diarrhea, prior history of C. difficile, associated with weight loss Recent use of antibiotics for peritonitis in early December 2024 Stool culture negative Recommend GI profile PCR, C. difficile toxin A and B, reordered Check fecal calprotectin levels as well as pancreatic fecal elastase  levels If above workup is negative, recommend colonoscopy with biopsies  Iron  deficiency anemia secondary to chronic blood loss from small bowel and colonic AVMs S/p IV iron  therapy, improvement in hemoglobin -No evidence of B12 or folate deficiency, -S/p repeat push enteroscopy in 10/22, APC of nonbleeding small bowel AVMs  -Also, has atrophic gastritis, likely contributing to iron  deficiency anemia.  No evidence of H. pylori infection  Compensated cirrhosis, probably NASH in etiology Portal hypertension: Manifested as mild portal hypertensive gastropathy and splenomegaly No evidence of varices based on EGD from 01/2018 Patient is euvolemic, no evidence of ascites HCC screening: Up-to-date on imaging, AFP negative, recommend right upper quadrant ultrasound PSE none  Follow up based on the above workup   [name], MD

## 2023-07-17 ENCOUNTER — Encounter: Payer: Self-pay | Admitting: Orthopedic Surgery

## 2023-07-17 ENCOUNTER — Ambulatory Visit (INDEPENDENT_AMBULATORY_CARE_PROVIDER_SITE_OTHER): Payer: Medicare HMO | Admitting: Orthopedic Surgery

## 2023-07-17 DIAGNOSIS — M546 Pain in thoracic spine: Secondary | ICD-10-CM | POA: Diagnosis not present

## 2023-07-17 DIAGNOSIS — S22070D Wedge compression fracture of T9-T10 vertebra, subsequent encounter for fracture with routine healing: Secondary | ICD-10-CM | POA: Diagnosis not present

## 2023-07-17 DIAGNOSIS — S22079S Unspecified fracture of T9-T10 vertebra, sequela: Secondary | ICD-10-CM

## 2023-07-22 DIAGNOSIS — R197 Diarrhea, unspecified: Secondary | ICD-10-CM | POA: Diagnosis not present

## 2023-07-22 DIAGNOSIS — A0472 Enterocolitis due to Clostridium difficile, not specified as recurrent: Secondary | ICD-10-CM | POA: Diagnosis not present

## 2023-07-23 DIAGNOSIS — R197 Diarrhea, unspecified: Secondary | ICD-10-CM | POA: Diagnosis not present

## 2023-07-23 DIAGNOSIS — R109 Unspecified abdominal pain: Secondary | ICD-10-CM | POA: Diagnosis not present

## 2023-07-25 ENCOUNTER — Telehealth: Payer: Self-pay

## 2023-07-25 MED ORDER — VANCOMYCIN HCL 125 MG PO CAPS: 125.0000 mg | ORAL_CAPSULE | Freq: Four times a day (QID) | ORAL | 0 refills | Status: AC

## 2023-07-25 NOTE — Telephone Encounter (Signed)
Sent medication to pharmacy and called and left a message for call back for patient

## 2023-07-25 NOTE — Telephone Encounter (Signed)
-----   Message from [facility] sent at 07/25/2023  1:46 PM EST ----- C Diff positive as suspected, recommend oral vancomycin 125mg  PO 4 times daily for 10days  RV

## 2023-07-25 NOTE — Telephone Encounter (Signed)
The patient called in and left a voicemail requesting [place] to call back. She said that she was returning [place] call. I called her back to let her know that we got her message, and I send the message to Brooklyn.

## 2023-07-25 NOTE — Telephone Encounter (Signed)
Patient Verbalized understanding of results

## 2023-07-27 LAB — GI PROFILE, STOOL, PCR

## 2023-07-27 LAB — CALPROTECTIN, FECAL: Calprotectin, Fecal: 31 ug/g (ref 0–120)

## 2023-07-27 LAB — PANCREATIC ELASTASE, FECAL: Pancreatic Elastase, Fecal: 381 ug Elast./g (ref >200)

## 2023-07-28 LAB — C DIFFICILE TOXINS A+B W/RFLX: C difficile Toxins A+B, EIA: NEGATIVE

## 2023-07-28 LAB — C DIFFICILE, CYTOTOXIN B

## 2023-08-06 ENCOUNTER — Ambulatory Visit
Payer: Medicare HMO | Attending: Student in an Organized Health Care Education/Training Program | Admitting: Student in an Organized Health Care Education/Training Program

## 2023-08-06 ENCOUNTER — Encounter: Payer: Self-pay | Admitting: Student in an Organized Health Care Education/Training Program

## 2023-08-06 VITALS — BP 116/60 | HR 112 | Resp 16 | Ht 64.0 in | Wt 145.0 lb

## 2023-08-06 DIAGNOSIS — M810 Age-related osteoporosis without current pathological fracture: Secondary | ICD-10-CM | POA: Diagnosis not present

## 2023-08-06 DIAGNOSIS — M47894 Other spondylosis, thoracic region: Secondary | ICD-10-CM | POA: Diagnosis not present

## 2023-08-06 DIAGNOSIS — M549 Dorsalgia, unspecified: Secondary | ICD-10-CM | POA: Insufficient documentation

## 2023-08-06 DIAGNOSIS — G894 Chronic pain syndrome: Secondary | ICD-10-CM | POA: Insufficient documentation

## 2023-08-06 DIAGNOSIS — N1832 Chronic kidney disease, stage 3b: Secondary | ICD-10-CM | POA: Insufficient documentation

## 2023-08-06 DIAGNOSIS — S22070S Wedge compression fracture of T9-T10 vertebra, sequela: Secondary | ICD-10-CM | POA: Diagnosis not present

## 2023-08-06 NOTE — Progress Notes (Signed)
Safety precautions to be maintained throughout the outpatient stay will include: orient to surroundings, keep bed in low position, maintain call bell within reach at all times, provide assistance with transfer out of bed and ambulation.

## 2023-08-06 NOTE — Progress Notes (Signed)
Patient: Emily Faulkner  Service Category: E/M  Provider: Edward Jolly, MD  DOB: 30-Jun-1941  DOS: 08/06/2023  Referring Provider: Gardiner Rhyme  MRN: [id number]  Setting: Ambulatory outpatient  PCP: Marina Goodell, MD  Type: New Patient  Specialty: Interventional Pain Management    Location: Office  Delivery: Face-to-face     Primary Reason(s) for Visit: Encounter for initial evaluation of one or more chronic problems (new to examiner) potentially causing chronic pain, and posing a threat to normal musculoskeletal function. (Level of risk: High) CC: Back Pain (Mid back pain that radiates to the right.  S/p several surgeries that helped for a while then the pain returned.  The last surgery did not help at all )  HPI  Emily Faulkner is a 83 y.o. year old, female patient, who comes for the first time to our practice referred by Drake Leach, PA-C for our initial evaluation of her chronic pain. She has Iron deficiency anemia; Type 2 diabetes mellitus (HCC); Pulmonary hypertension (HCC); Personal history of breast cancer; Microalbuminuric diabetic nephropathy (HCC); Hypertension; Hyperlipidemia, unspecified; High cholesterol; Renal mass; Age-related osteoporosis without current pathological fracture; Complete heart block (HCC); Closed fracture of distal end of right fibula; Coronary artery disease involving native coronary artery of native heart; Type 2 diabetes mellitus with stage 3 chronic kidney disease, with long-term current use of insulin (HCC); Symptomatic bradycardia; Stage 3b chronic kidney disease (HCC); Acquired arteriovenous malformation of small intestine; Renal mass, left; Renal cancer (HCC); Intractable back pain; Breast cancer (HCC); Closed wedge compression fracture of T10 vertebra (HCC); Mass of lower outer quadrant of left breast; Parotiditis; Hyperbilirubinemia; Thrombocytopenia (HCC); and Thoracic facet joint syndrome on their problem list. Today she comes in for evaluation of her Back  Pain (Mid back pain that radiates to the right.  S/p several surgeries that helped for a while then the pain returned.  The last surgery did not help at all )  Pain Assessment: Location: Mid, Right Back Radiating: around to right abdomen Onset: More than a month ago Duration: Chronic pain Quality: Aching, Constant, Discomfort, Heaviness, Sharp, Stabbing, Throbbing Severity: 5 /10 (subjective, self-reported pain score)  Effect on ADL: limiting Timing: Constant Modifying factors: patient is able to get some relief in her lift chair relcined.  when she rises there seems to be pressure in the back and the pain intensifies the longer she is up. BP: 116/60  HR: (!) 112  Onset and Duration: Sudden, Date of onset: July 2024, and Present longer than 3 months Cause of pain: Trauma Severity: NAS-11 at its worse: 8/10, NAS-11 at its best: 4/10, NAS-11 now: 5/10, and NAS-11 on the average: 6/10 Timing: During activity or exercise, After activity or exercise, and After a period of immobility Aggravating Factors: Bending, Lifiting, Motion, Prolonged standing, Squatting, Stooping , Twisting, and Walking Alleviating Factors: Lying down, Resting, and Sitting Associated Problems: Fatigue, Weakness, and Pain that wakes patient up Quality of Pain: Aching, Feeling of weight, Sharp, Stabbing, and Throbbing Previous Examinations or Tests: Bone scan, CT scan, X-rays, and Neurological evaluation Previous Treatments: Narcotic medications  Ms. Harrow is being evaluated for possible interventional pain management therapies for the treatment of her chronic pain.  Discussed the use of AI scribe software for clinical note transcription with the patient, who gave verbal consent to proceed.  History of Present Illness   The patient is an 83 year old female with a history of T10 kyphoplasty who presents with continued mid to lower back pain. She is  accompanied by her son, Emily Faulkner. She was referred by Lonn Georgia for  evaluation of her back pain.  She experiences persistent mid to lower back pain, particularly around the bra strap area. The pain worsens when she rises from a sitting position and intensifies the longer she remains upright. She describes the sensation as 'putting pressure on it' when standing.  She has a history of compression fractures and has undergone vertebral body augmentation at T10, T12, and L4. Despite these procedures, she continues to experience significant pain. There is a possible T9 fracture currently being evaluated.  She has been prescribed hydrocodone, which she takes sparingly, with four tablets remaining from a prescription of ten given over a month ago. She takes the medication only when absolutely necessary due to concerns about the availability of future prescriptions.  Her medical history includes lumbar degenerative disc disease at L2, L3, chronic kidney disease, hypertension, a renal mass, osteoporosis, renal carcinoma, diabetes, and the presence of a pacemaker.       Historic Controlled Substance Pharmacotherapy Review  PMP and historical list of controlled substances: Hydrocodone 5 mg as needed severe pain Historical Monitoring:  06/19/2023 06/19/2023  1 Hydrocodone-Acetamin 5-325 Mg 10.00 4 St Lun [id number] Nor ([id number]) 0/0 12.50 MME Medicare [place]   The patient  reports no history of drug use. List of prior UDS Testing: No results found for: "MDMA", "COCAINSCRNUR", "PCPSCRNUR", "PCPQUANT", "CANNABQUANT", "THCU", "ETH", "CBDTHCR", "D8THCCBX", "D9THCCBX" Historical Background Evaluation: [place] PMP: PDMP reviewed during this encounter. Review of the past 95-months conducted.              [place] Department of public safety, offender search: Engineer, mining Information) Non-contributory Risk Assessment Profile: Aberrant behavior: None observed or detected today Risk factors for fatal opioid overdose: None identified today  Fatal overdose hazard ratio (HR): Calculation  deferred Non-fatal overdose hazard ratio (HR): Calculation deferred Risk of opioid abuse or dependence: 0.7-3.0% with doses <= 36 MME/day and 6.1-26% with doses >= 120 MME/day. Substance use disorder (SUD) risk level: See below   ORT Scoring interpretation table:  Score <3 = Low Risk for SUD  Score between 4-7 = Moderate Risk for SUD  Score >8 = High Risk for Opioid Abuse   PHQ-2 Depression Scale:  Total score: 4  PHQ-2 Scoring interpretation table: (Score and probability of major depressive disorder)  Score 0 = No depression  Score 1 = 15.4% Probability  Score 2 = 21.1% Probability  Score 3 = 38.4% Probability  Score 4 = 45.5% Probability  Score 5 = 56.4% Probability  Score 6 = 78.6% Probability   PHQ-9 Depression Scale:  Total score: 9  PHQ-9 Scoring interpretation table:  Score 0-4 = No depression  Score 5-9 = Mild depression  Score 10-14 = Moderate depression  Score 15-19 = Moderately severe depression  Score 20-27 = Severe depression (2.4 times higher risk of SUD and 2.89 times higher risk of overuse)   Pharmacologic Plan: As per protocol, I have not taken over any controlled substance management, pending the results of ordered tests and/or consults.            Initial impression: Pending review of available data and ordered tests.  Meds   Current Outpatient Medications:    ACCU-CHEK AVIVA PLUS test strip, , Disp: , Rfl:    ACCU-CHEK SOFTCLIX LANCETS lancets, once daily Use as instructed., Disp: , Rfl:    alendronate (FOSAMAX) 70 MG tablet, TAKE 1 TABLET BY MOUTH EVERY 7DAYS WITH A FULL GLASS OF WATER.  DO NOT LIE DOWN FOR 30 MINUTES, Disp: , Rfl:    Biotin 1000 MCG tablet, Take 1,000 mcg by mouth daily., Disp: , Rfl:    Calcium Carb-Cholecalciferol (OYSTER SHELL CALCIUM W/D) 500-5 MG-MCG TABS, Take 1 tablet by mouth daily., Disp: , Rfl:    Cholecalciferol 50 MCG (2000 UT) CAPS, Take 2,000 Units by mouth daily., Disp: , Rfl:    feeding supplement, ENSURE ENLIVE, (ENSURE  ENLIVE) LIQD, Take 237 mLs by mouth 2 (two) times daily between meals., Disp: 237 mL, Rfl: 12   HYDROcodone-acetaminophen (NORCO) 5-325 MG tablet, Take 1 tablet by mouth every 8 (eight) hours as needed for severe pain (pain score 7-10)., Disp: 10 tablet, Rfl: 0   insulin glargine (LANTUS) 100 UNIT/ML Solostar Pen, Inject 15 Units into the skin daily. (may take up to 50u based upon blood glucose reading) (Patient taking differently: Inject 28 Units into the skin daily. (may take up to 50u based upon blood glucose reading)), Disp: 15 mL, Rfl: 11   Insulin Pen Needle 32G X 4 MM MISC, USE 1 NEEDLE SUBCUTANEOUSLY ONCE A DAY AS DIRECTED, Disp: , Rfl:    losartan (COZAAR) 50 MG tablet, Take 25 mg by mouth daily., Disp: , Rfl:    niacin 500 MG CR capsule, Take 500 mg by mouth at bedtime., Disp: , Rfl:    omeprazole (PRILOSEC) 40 MG capsule, TAKE 1 CAPSULE BY MOUTH EVERY DAY BEFORE BREAKFAST, Disp: 30 capsule, Rfl: 0   Probiotic Product (PROBIOTIC PO), Take 1 tablet by mouth daily. 2 billion, Disp: , Rfl:    simvastatin (ZOCOR) 20 MG tablet, Take 20 mg by mouth at bedtime., Disp: , Rfl: 6   sitaGLIPtin (JANUVIA) 50 MG tablet, Take 50 mg by mouth daily., Disp: , Rfl:    TRAVATAN Z 0.004 % SOLN ophthalmic solution, Place 1 drop into both eyes at bedtime., Disp: , Rfl:    TURMERIC PO, Take 538 mg by mouth daily., Disp: , Rfl:    vitamin B-12 (CYANOCOBALAMIN) 1000 MCG tablet, Take 2,000 mcg by mouth daily., Disp: , Rfl:   Imaging Review   Narrative CLINICAL DATA:  Status post recent T10 kyphoplasty, continued mid to lower back pain  EXAM: THORACIC SPINE 2 VIEWS; LUMBAR SPINE - 2-3 VIEW  COMPARISON:  Multiple priors  FINDINGS: Thoracic spine:  Gentle sigmoid scoliotic curvature of the thoracolumbar spine. Interval T10 vertebral body augmentation without complicating feature. Stable chronic changes of T12 vertebral body augmentation. Similar chronic wedging involving the inferior endplate of the  T11 vertebral body. No new compression fracture identified. Osteopenia. Implantable cardiac device is present. Atherosclerosis.  Lumbar spine:  Gentle sigmoid scoliotic curvature of the thoracolumbar spine. Stable chronic changes of L4 vertebral body augmentation. No new compression fracture identified. Multilevel degenerative changes with severe disc space disease at L2-L3. Facet arthropathy more pronounced in the lower lumbar spine. Atherosclerosis.  IMPRESSION: Thoracic, lumbar spine:  1. Interval T10 vertebral body augmentation without complicating feature. No new compression fracture identified. 2. Similar chronic changes of additional vertebral body augmentation at T12 and L4. 3. Multilevel degenerative changes most pronounced at L2-L3 with severe disc space disease.   Electronically Signed By: Olive Bass M.D. On: 05/23/2023 14:22  DG Lumbar Spine 2-3 Views  Narrative CLINICAL DATA:  Status post recent T10 kyphoplasty, continued mid to lower back pain  EXAM: THORACIC SPINE 2 VIEWS; LUMBAR SPINE - 2-3 VIEW  COMPARISON:  Multiple priors  FINDINGS: Thoracic spine:  Gentle sigmoid scoliotic curvature of the thoracolumbar  spine. Interval T10 vertebral body augmentation without complicating feature. Stable chronic changes of T12 vertebral body augmentation. Similar chronic wedging involving the inferior endplate of the T11 vertebral body. No new compression fracture identified. Osteopenia. Implantable cardiac device is present. Atherosclerosis.  Lumbar spine:  Gentle sigmoid scoliotic curvature of the thoracolumbar spine. Stable chronic changes of L4 vertebral body augmentation. No new compression fracture identified. Multilevel degenerative changes with severe disc space disease at L2-L3. Facet arthropathy more pronounced in the lower lumbar spine. Atherosclerosis.  IMPRESSION: Thoracic, lumbar spine:  1. Interval T10 vertebral body augmentation without  complicating feature. No new compression fracture identified. 2. Similar chronic changes of additional vertebral body augmentation at T12 and L4. 3. Multilevel degenerative changes most pronounced at L2-L3 with severe disc space disease.   Electronically Signed By: Olive Bass M.D. On: 05/23/2023 14:22   DG Foot Complete Right  Narrative CLINICAL DATA:  Patient states that during the night she started having worsening pain and swelling in her right foot but also over the past week. Patient states no known injury. No wounds noted on foot. Per ER note patient does have non-pitting edema in right foot with some redness noted.  EXAM: RIGHT FOOT COMPLETE - 3+ VIEW  COMPARISON:  None.  FINDINGS: No fracture.  Joints are normally spaced and aligned.  Small to moderate plantar and dorsal calcaneal spurs.  Bones are demineralized.  Mild dorsal forefoot soft tissue swelling.  IMPRESSION: No fracture or dislocation.  Mild dorsal soft tissue swelling.   Electronically Signed By: Amie Portland M.D. On: 05/04/2018 15:32   Complexity Note: Imaging results reviewed.                         ROS  Cardiovascular: High blood pressure and Pacemaker or defibrillator Pulmonary or Respiratory: Smoking (previously) Neurological: No reported neurological signs or symptoms such as seizures, abnormal skin sensations, urinary and/or fecal incontinence, being born with an abnormal open spine and/or a tethered spinal cord Psychological-Psychiatric: Difficulty sleeping and or falling asleep Gastrointestinal: Reflux or heatburn and Scarred liver (Cirrhosis) Genitourinary: Kidney disease Hematological: Weakness due to low blood hemoglobin or red blood cell count (Anemia) and Brusing easily Endocrine: High blood sugar requiring insulin (IDDM) Rheumatologic: Joint aches and or swelling due to excess weight (Osteoarthritis) Musculoskeletal: Multiple sclerosis Work History:  Retired  Allergies  Ms. Bohle has no known allergies.  Laboratory Chemistry Profile   Renal Lab Results  Component Value Date   BUN 8 06/17/2023   CREATININE 1.18 (H) 06/17/2023   LABCREA 99 03/18/2018   BCR 7 (L) 06/17/2023   GFRAA 48 (L) 01/28/2019   GFRNONAA 32 (L) 05/30/2023   PROTEINUR 100 (A) 10/25/2020     Electrolytes Lab Results  Component Value Date   NA 143 06/17/2023   K 3.6 06/17/2023   CL 108 (H) 06/17/2023   CALCIUM 9.9 06/17/2023   MG 2.3 10/29/2020   PHOS 3.8 10/28/2020     Hepatic Lab Results  Component Value Date   AST 33 06/17/2023   ALT 16 06/17/2023   ALBUMIN 3.8 06/17/2023   ALKPHOS 103 06/17/2023   AMMONIA 29 01/29/2018     ID Lab Results  Component Value Date   SARSCOV2NAA NEGATIVE 10/24/2020   STAPHAUREUS POSITIVE (A) 10/26/2020   MRSAPCR NEGATIVE 10/26/2020   HCVAB NON REACTIVE 10/26/2020     Bone No results found for: "VD25OH", "VD125OH2TOT", "GN5621HY8", "MV7846NG2", "25OHVITD1", "25OHVITD2", "25OHVITD3", "TESTOFREE", "TESTOSTERONE"   Endocrine Lab  Results  Component Value Date   GLUCOSE 163 (H) 06/17/2023   GLUCOSEU NEGATIVE 10/25/2020   HGBA1C 7.5 (H) 04/09/2023   TSH 3.293 05/07/2022     Neuropathy Lab Results  Component Value Date   VITAMINB12 1,215 (H) 05/07/2022   FOLATE 23.0 05/07/2022   HGBA1C 7.5 (H) 04/09/2023     CNS No results found for: "COLORCSF", "APPEARCSF", "RBCCOUNTCSF", "WBCCSF", "POLYSCSF", "LYMPHSCSF", "EOSCSF", "PROTEINCSF", "GLUCCSF", "JCVIRUS", "CSFOLI", "IGGCSF", "LABACHR", "ACETBL"   Inflammation (CRP: Acute  ESR: Chronic) Lab Results  Component Value Date   LATICACIDVEN 1.9 05/28/2023     Rheumatology Lab Results  Component Value Date   LABURIC 8.5 (H) 05/04/2018     Coagulation Lab Results  Component Value Date   INR CANCELED 05/02/2023   LABPROT 17.9 (H) 08/31/2022   PLT 124 (L) 06/17/2023     Cardiovascular Lab Results  Component Value Date   BNP 279.0 (H) 01/29/2018    CKTOTAL 472 (H) 10/25/2020   TROPONINI <0.03 01/29/2018   HGB 12.1 06/17/2023   HCT 37.8 06/17/2023     Screening Lab Results  Component Value Date   SARSCOV2NAA NEGATIVE 10/24/2020   STAPHAUREUS POSITIVE (A) 10/26/2020   MRSAPCR NEGATIVE 10/26/2020   HCVAB NON REACTIVE 10/26/2020     Cancer No results found for: "CEA", "CA125", "LABCA2"   Allergens No results found for: "ALMOND", "APPLE", "ASPARAGUS", "AVOCADO", "BANANA", "BARLEY", "BASIL", "BAYLEAF", "GREENBEAN", "LIMABEAN", "WHITEBEAN", "BEEFIGE", "REDBEET", "BLUEBERRY", "BROCCOLI", "CABBAGE", "MELON", "CARROT", "CASEIN", "CASHEWNUT", "CAULIFLOWER", "CELERY"     Note: Lab results reviewed.  PFSH  Drug: Ms. Fiallo  reports no history of drug use. Alcohol:  reports no history of alcohol use. Tobacco:  reports that she quit smoking about 12 years ago. Her smoking use included cigarettes. She has never used smokeless tobacco. Medical:  has a past medical history of Acute kidney failure (HCC) (11/16/2020), AKI (acute kidney injury) (HCC) (03/17/2018), Anemia, Cancer (HCC), Diabetes mellitus without complication (HCC), GIB (gastrointestinal bleeding) (02/20/2018), Glaucoma, Hyperkalemia (10/24/2020), Hyperlipidemia, Hypertension, Incontinence of bowel, Neuropathy, Odynophagia (10/24/2020), Osteoarthritis, Presence of permanent cardiac pacemaker, Rheumatoid arthritis (HCC), Syncope and collapse (10/24/2020), and Telangiectasias. Family: family history includes Kidney cancer in her son.  Past Surgical History:  Procedure Laterality Date   BREAST SURGERY     right mastectomy   CHOLECYSTECTOMY     COLONOSCOPY     COLONOSCOPY WITH PROPOFOL N/A 08/14/2016   Procedure: COLONOSCOPY WITH PROPOFOL;  Surgeon: Christena Deem, MD;  Location: [facility] ENDOSCOPY;  Service: Endoscopy;  Laterality: N/A;   COLONOSCOPY WITH PROPOFOL N/A 02/22/2018   Procedure: COLONOSCOPY WITH PROPOFOL;  Surgeon: Wyline Mood, MD;  Location: [facility] ENDOSCOPY;  Service:  Gastroenterology;  Laterality: N/A;   ENTEROSCOPY N/A 04/12/2021   Procedure: ENTEROSCOPY;  Surgeon: Toney Reil, MD;  Location: [facility] ENDOSCOPY;  Service: Gastroenterology;  Laterality: N/A;  push   ESOPHAGOGASTRODUODENOSCOPY (EGD) WITH PROPOFOL N/A 08/14/2016   Procedure: ESOPHAGOGASTRODUODENOSCOPY (EGD) WITH PROPOFOL;  Surgeon: Christena Deem, MD;  Location: [facility] ENDOSCOPY;  Service: Endoscopy;  Laterality: N/A;   ESOPHAGOGASTRODUODENOSCOPY (EGD) WITH PROPOFOL N/A 01/30/2018   Procedure: ESOPHAGOGASTRODUODENOSCOPY (EGD) WITH PROPOFOL;  Surgeon: Toledo, Boykin Nearing, MD;  Location: [facility] ENDOSCOPY;  Service: Gastroenterology;  Laterality: N/A;   ESOPHAGOGASTRODUODENOSCOPY (EGD) WITH PROPOFOL N/A 02/22/2018   Procedure: ESOPHAGOGASTRODUODENOSCOPY (EGD) WITH PROPOFOL;  Surgeon: Wyline Mood, MD;  Location: [facility] ENDOSCOPY;  Service: Gastroenterology;  Laterality: N/A;   EXCISIONAL HEMORRHOIDECTOMY     IR KYPHO THORACIC WITH BONE BIOPSY  05/14/2023   IR RADIOLOGIST EVAL & MGMT  08/07/2022   IR RADIOLOGIST EVAL & MGMT  09/12/2022   IR RADIOLOGIST EVAL & MGMT  05/02/2023   IR RADIOLOGIST EVAL & MGMT  05/02/2023   IR RADIOLOGIST EVAL & MGMT  05/23/2023   KYPHOPLASTY N/A 03/20/2018   Procedure: [id number];  Surgeon: Kennedy Bucker, MD;  Location: [facility] ORS;  Service: Orthopedics;  Laterality: N/A;   PACEMAKER IMPLANT N/A 10/26/2020   Procedure: PACEMAKER IMPLANT;  Surgeon: Marcina Millard, MD;  Location: [facility] INVASIVE CV LAB;  Service: Cardiovascular;  Laterality: N/A;   RADIOLOGY WITH ANESTHESIA N/A 08/31/2022   Procedure: CT renal microwave ablation;  Surgeon: Pernell Dupre, MD;  Location[facility] OR;  Service: Radiology;  Laterality: N/A;   Active Ambulatory Problems    Diagnosis Date Noted   Iron deficiency anemia 04/15/2016   Type 2 diabetes mellitus (HCC) 08/04/2014   Pulmonary hypertension (HCC) 09/11/2016   Personal history of breast cancer 07/17/2012   Microalbuminuric diabetic  nephropathy (HCC) 11/02/2016   Hypertension 09/06/2016   Hyperlipidemia, unspecified 01/22/2014   High cholesterol 09/06/2016   Renal mass 03/17/2018   Age-related osteoporosis without current pathological fracture 05/09/2018   Complete heart block (HCC) 10/24/2020   Closed fracture of distal end of right fibula 10/28/2020   Coronary artery disease involving native coronary artery of native heart 11/10/2020   Type 2 diabetes mellitus with stage 3 chronic kidney disease, with long-term current use of insulin (HCC) 09/16/2018   Symptomatic bradycardia 11/10/2020   Stage 3b chronic kidney disease (HCC) 11/10/2020   Acquired arteriovenous malformation of small intestine    Renal mass, left 08/31/2022   Renal cancer (HCC) 08/31/2022   Intractable back pain 04/08/2023   Breast cancer (HCC) 04/08/2023   Closed wedge compression fracture of T10 vertebra (HCC) 04/09/2023   Mass of lower outer quadrant of left breast 04/10/2023   Parotiditis 05/28/2023   Hyperbilirubinemia 05/28/2023   Thrombocytopenia (HCC) 05/28/2023   Thoracic facet joint syndrome 08/06/2023   Resolved Ambulatory Problems    Diagnosis Date Noted   Shortness of breath 09/06/2016   Anemia 09/06/2016   GIB (gastrointestinal bleeding) 02/20/2018   AKI (acute kidney injury) (HCC) 03/17/2018   Syncope and collapse 10/24/2020   Hyperkalemia 10/24/2020   Odynophagia 10/24/2020   Acute kidney failure (HCC) 11/16/2020   Past Medical History:  Diagnosis Date   Cancer (HCC)    Diabetes mellitus without complication (HCC)    Glaucoma    Hyperlipidemia    Incontinence of bowel    Neuropathy    Osteoarthritis    Presence of permanent cardiac pacemaker    Rheumatoid arthritis (HCC)    Telangiectasias    Constitutional Exam  General appearance: Well nourished, well developed, and well hydrated. In no apparent acute distress Vitals:   08/06/23 0958  BP: 116/60  Pulse: (!) 112  Resp: 16  SpO2: 97%  Weight: 145 lb (65.8  kg)  Height: 5\' 4"  (1.626 m)   BMI Assessment: Estimated body mass index is 24.89 kg/m as calculated from the following:   Height as of this encounter: 5\' 4"  (1.626 m).   Weight as of this encounter: 145 lb (65.8 kg).  BMI interpretation table: BMI level Category Range association with higher incidence of chronic pain  <18 kg/m2 Underweight   18.5-24.9 kg/m2 Ideal body weight   25-29.9 kg/m2 Overweight Increased incidence by 20%  30-34.9 kg/m2 Obese (Class I) Increased incidence by 68%  35-39.9 kg/m2 Severe obesity (Class II) Increased incidence by 136%  >40 kg/m2 Extreme obesity (  Class III) Increased incidence by 254%   Patient's current BMI Ideal Body weight  Body mass index is 24.89 kg/m. Ideal body weight: 54.7 kg (120 lb 9.5 oz) Adjusted ideal body weight: 59.1 kg (130 lb 5.7 oz)   BMI Readings from Last 4 Encounters:  08/06/23 24.89 kg/m  07/16/23 23.71 kg/m  06/19/23 25.40 kg/m  05/28/23 25.40 kg/m   Wt Readings from Last 4 Encounters:  08/06/23 145 lb (65.8 kg)  07/16/23 138 lb 2 oz (62.7 kg)  06/19/23 148 lb (67.1 kg)  05/28/23 148 lb (67.1 kg)    Psych/Mental status: Alert, oriented x 3 (person, place, & time)       Eyes: PERLA Respiratory: No evidence of acute respiratory distress  Thoracic Spine Area Exam  Skin & Axial Inspection: prominent thoracic Kyphosis Alignment: Symmetrical Functional ROM: Decreased ROM Stability: No instability detected Muscle Tone/Strength: Functionally intact. No obvious neuro-muscular anomalies detected. Sensory (Neurological): Musculoskeletal pain pattern Muscle strength & Tone: No palpable anomalies Lumbar Spine Area Exam  Skin & Axial Inspection: Lumbar Scoliosis Alignment: Symmetrical Functional ROM: Pain restricted ROM       Stability: No instability detected Muscle Tone/Strength: Functionally intact. No obvious neuro-muscular anomalies detected. Sensory (Neurological): Musculoskeletal pain pattern  Gait & Posture  Assessment  Ambulation: Patient came in today in a wheel chair Gait: Significantly limited. Dependent on assistive device to ambulate Posture: Difficulty standing up straight, due to pain  Lower Extremity Exam    Side: Right lower extremity  Side: Left lower extremity  Stability: No instability observed          Stability: No instability observed          Skin & Extremity Inspection: Skin color, temperature, and hair growth are WNL. No peripheral edema or cyanosis. No masses, redness, swelling, asymmetry, or associated skin lesions. No contractures.  Skin & Extremity Inspection: Skin color, temperature, and hair growth are WNL. No peripheral edema or cyanosis. No masses, redness, swelling, asymmetry, or associated skin lesions. No contractures.  Functional ROM: Unrestricted ROM                  Functional ROM: Unrestricted ROM                  Muscle Tone/Strength: Functionally intact. No obvious neuro-muscular anomalies detected.  Muscle Tone/Strength: Functionally intact. No obvious neuro-muscular anomalies detected.  Sensory (Neurological): Unimpaired        Sensory (Neurological): Unimpaired        DTR: Patellar: deferred today Achilles: deferred today Plantar: deferred today  DTR: Patellar: deferred today Achilles: deferred today Plantar: deferred today  Palpation: No palpable anomalies  Palpation: No palpable anomalies    Assessment  Primary Diagnosis & Pertinent Problem List: The primary encounter diagnosis was Thoracic facet joint syndrome. Diagnoses of Closed wedge compression fracture of T10 vertebra, sequela, Age-related osteoporosis without current pathological fracture, Stage 3b chronic kidney disease (HCC), Intractable back pain, and Chronic pain syndrome were also pertinent to this visit.  Visit Diagnosis (New problems to examiner): 1. Thoracic facet joint syndrome   2. Closed wedge compression fracture of T10 vertebra, sequela   3. Age-related osteoporosis without  current pathological fracture   4. Stage 3b chronic kidney disease (HCC)   5. Intractable back pain   6. Chronic pain syndrome    Plan of Care (Initial workup plan)  Note: Ms. Almendarez was reminded that as per protocol, today's visit has been an evaluation only. We have not taken over the  patient's controlled substance management.  Problem-specific plan: Assessment and Plan    Mid Back Pain An 83 year old with a history of T10 kyphoplasty experiences persistent mid to lower back pain, worsened by movement. Previous kyphoplasty at T10, T12, and L4 provided limited relief. A possible T9 fracture is under evaluation. Pain is likely due to facet joint irritation following compression fractures and kyphoplasty. A nerve block targeting T9, T10, T11 facet joints was discussed, highlighting potential benefits and limitations. Prefers to use hydrocodone sparingly due to concerns about long-term effects. Plan to schedule a nerve block procedure for T9-11 facet joints, collect a urine specimen for hydrocodone management to be taken over at next visit.  Compression Fractures Multiple compression fractures at T10, T12, and L4 have been treated with kyphoplasty with varying success. Monitor for new fractures, particularly at T9.   Lab Orders         Compliance Drug Analysis, Ur      Procedure Orders         THORACIC FACET BLOCK     Provider-requested follow-up: Return in about 2 weeks (around 08/20/2023) for T9-T11 MBNB + MM.  Future Appointments  Date Time Provider Department Center  08/09/2023  1:45 PM [facility] None  08/09/2023  2:00 PM [name], MD [facility] None  08/23/2023 11:30 AM Drake Leach, PA-C [facility] None    Duration of encounter: .  Total time on encounter, as per AMA guidelines included both the face-to-face and non-face-to-face time personally spent by the physician and/or other qualified health care professional(s) on the day of the encounter (includes time  in activities that require the physician or other qualified health care professional and does not include time in activities normally performed by clinical staff). Physician's time may include the following activities when performed: Preparing to see the patient (e.g., pre-charting review of records, searching for previously ordered imaging, lab work, and nerve conduction tests) Review of prior analgesic pharmacotherapies. Reviewing PMP Interpreting ordered tests (e.g., lab work, imaging, nerve conduction tests) Performing post-procedure evaluations, including interpretation of diagnostic procedures Obtaining and/or reviewing separately obtained history Performing a medically appropriate examination and/or evaluation Counseling and educating the patient/family/caregiver Ordering medications, tests, or procedures Referring and communicating with other health care professionals (when not separately reported) Documenting clinical information in the electronic or other health record Independently interpreting results (not separately reported) and communicating results to the patient/ family/caregiver Care coordination (not separately reported)  Note by: Edward Jolly, MD (AI and TTS technology used. I apologize for any typographical errors that were not detected and corrected.) Date: 08/06/2023; Time: 11:56 AM

## 2023-08-06 NOTE — Patient Instructions (Signed)
______________________________________________________________________    General Risks and Possible Complications  Patient Responsibilities: It is important that you read this as it is part of your informed consent. It is our duty to inform you of the risks and possible complications associated with treatments offered to you. It is your responsibility as a patient to read this and to ask questions about anything that is not clear or that you believe was not covered in this document.  Patient's Rights: You have the right to refuse treatment. You also have the right to change your mind, even after initially having agreed to have the treatment done. However, under this last option, if you wait until the last second to change your mind, you may be charged for the materials used up to that point.  Introduction: Medicine is not an Visual merchandiser. Everything in Medicine, including the lack of treatment(s), carries the potential for danger, harm, or loss (which is by definition: Risk). In Medicine, a complication is a secondary problem, condition, or disease that can aggravate an already existing one. All treatments carry the risk of possible complications. The fact that a side effects or complications occurs, does not imply that the treatment was conducted incorrectly. It must be clearly understood that these can happen even when everything is done following the highest safety standards.  No treatment: You can choose not to proceed with the proposed treatment alternative. The "PRO(s)" would include: avoiding the risk of complications associated with the therapy. The "CON(s)" would include: not getting any of the treatment benefits. These benefits fall under one of three categories: diagnostic; therapeutic; and/or palliative. Diagnostic benefits include: getting information which can ultimately lead to improvement of the disease or symptom(s). Therapeutic benefits are those associated with the successful  treatment of the disease. Finally, palliative benefits are those related to the decrease of the primary symptoms, without necessarily curing the condition (example: decreasing the pain from a flare-up of a chronic condition, such as incurable terminal cancer).  General Risks and Complications: These are associated to most interventional treatments. They can occur alone, or in combination. They fall under one of the following six (6) categories: no benefit or worsening of symptoms; bleeding; infection; nerve damage; allergic reactions; and/or death. No benefits or worsening of symptoms: In Medicine there are no guarantees, only probabilities. No healthcare provider can ever guarantee that a medical treatment will work, they can only state the probability that it may. Furthermore, there is always the possibility that the condition may worsen, either directly, or indirectly, as a consequence of the treatment. Bleeding: This is more common if the patient is taking a blood thinner, either prescription or over the counter (example: Goody Powders, Fish oil, Aspirin, Garlic, etc.), or if suffering a condition associated with impaired coagulation (example: Hemophilia, cirrhosis of the liver, low platelet counts, etc.). However, even if you do not have one on these, it can still happen. If you have any of these conditions, or take one of these drugs, make sure to notify your treating physician. Infection: This is more common in patients with a compromised immune system, either due to disease (example: diabetes, cancer, human immunodeficiency virus [HIV], etc.), or due to medications or treatments (example: therapies used to treat cancer and rheumatological diseases). However, even if you do not have one on these, it can still happen. If you have any of these conditions, or take one of these drugs, make sure to notify your treating physician. Nerve Damage: This is more common when the treatment is  an invasive one, but it  can also happen with the use of medications, such as those used in the treatment of cancer. The damage can occur to small secondary nerves, or to large primary ones, such as those in the spinal cord and brain. This damage may be temporary or permanent and it may lead to impairments that can range from temporary numbness to permanent paralysis and/or brain death. Allergic Reactions: Any time a substance or material comes in contact with our body, there is the possibility of an allergic reaction. These can range from a mild skin rash (contact dermatitis) to a severe systemic reaction (anaphylactic reaction), which can result in death. Death: In general, any medical intervention can result in death, most of the time due to an unforeseen complication. ______________________________________________________________________      ______________________________________________________________________    Preparing for your procedure  Appointments: If you think you may not be able to keep your appointment, call 24-48 hours in advance to cancel. We need time to make it available to others.  Procedure visits are for procedures only. During your procedure appointment there will be: NO Prescription Refills*. NO medication changes or discussions*. NO discussion of disability issues*. NO unrelated pain problem evaluations*. NO evaluations to order other pain procedures*. *These will be addressed at a separate and distinct evaluation encounter on the provider's evaluation schedule and not during procedure days.  Instructions: Food intake: Avoid eating anything solid for at least 8 hours prior to your procedure. Clear liquid intake: You may take clear liquids such as water up to 2 hours prior to your procedure. (No carbonated drinks. No soda.) Transportation: Unless otherwise stated by your physician, bring a driver. (Driver cannot be a Market researcher, Pharmacist, community, or any other form of public transportation.) Morning  Medicines: Except for blood thinners, take all of your other morning medications with a sip of water. Make sure to take your heart and blood pressure medicines. If your blood pressure's lower number is above 100, the case will be rescheduled. Blood thinners: Make sure to stop your blood thinners as instructed.  If you take a blood thinner, but were not instructed to stop it, call our office [phone] and ask to talk to a nurse. Not stopping a blood thinner prior to certain procedures could lead to serious complications. Diabetics on insulin: Notify the staff so that you can be scheduled 1st case in the morning. If your diabetes requires high dose insulin, take only  of your normal insulin dose the morning of the procedure and notify the staff that you have done so. Preventing infections: Shower with an antibacterial soap the morning of your procedure.  Build-up your immune system: Take 1000 mg of Vitamin C with every meal (3 times a day) the day prior to your procedure. Antibiotics: Inform the nursing staff if you are taking any antibiotics or if you have any conditions that may require antibiotics prior to procedures. (Example: recent joint implants)   Pregnancy: If you are pregnant make sure to notify the nursing staff. Not doing so may result in injury to the fetus, including death.  Sickness: If you have a cold, fever, or any active infections, call and cancel or reschedule your procedure. Receiving steroids while having an infection may result in complications. Arrival: You must be in the facility at least 30 minutes prior to your scheduled procedure. Tardiness: Your scheduled time is also the cutoff time. If you do not arrive at least 15 minutes prior to your procedure, you will  be rescheduled.  Children: Do not bring any children with you. Make arrangements to keep them home. Dress appropriately: There is always a possibility that your clothing may get soiled. Avoid long dresses. Valuables:  Do not bring any jewelry or valuables.  Reasons to call and reschedule or cancel your procedure: (Following these recommendations will minimize the risk of a serious complication.) Surgeries: Avoid having procedures within 2 weeks of any surgery. (Avoid for 2 weeks before or after any surgery). Flu Shots: Avoid having procedures within 2 weeks of a flu shots or . (Avoid for 2 weeks before or after immunizations). Barium: Avoid having a procedure within 7-10 days after having had a radiological study involving the use of radiological contrast. (Myelograms, Barium swallow or enema study). Heart attacks: Avoid any elective procedures or surgeries for the initial 6 months after a "Myocardial Infarction" (Heart Attack). Blood thinners: It is imperative that you stop these medications before procedures. Let us know if you if you take any blood thinner.  Infection: Avoid procedures during or within two weeks of an infection (including chest colds or gastrointestinal problems). Symptoms associated with infections include: Localized redness, fever, chills, night sweats or profuse sweating, burning sensation when voiding, cough, congestion, stuffiness, runny nose, sore throat, diarrhea, nausea, vomiting, cold or Flu symptoms, recent or current infections. It is specially important if the infection is over the area that we intend to treat. Heart and lung problems: Symptoms that may suggest an active cardiopulmonary problem include: cough, chest pain, breathing difficulties or shortness of breath, dizziness, ankle swelling, uncontrolled high or unusually low blood pressure, and/or palpitations. If you are experiencing any of these symptoms, cancel your procedure and contact your primary care physician for an evaluation.  Remember:  Regular Business hours are:  Monday to Thursday 8:00 AM to 4:00 PM  Provider's Schedule: Delano Metz, MD:  Procedure days: Tuesday and Thursday 7:30 AM to 4:00 PM  Edward Jolly, MD:  Procedure days: Monday and Wednesday 7:30 AM to 4:00 PM Last  Updated: 06/18/2023 ______________________________________________________________________     Facet Blocks Patient Information  Description: The facets are joints in the spine between the vertebrae.  Like any joints in the body, facets can become irritated and painful.  Arthritis can also effect the facets.  By injecting steroids and local anesthetic in and around these joints, we can temporarily block the nerve supply to them.  Steroids act directly on irritated nerves and tissues to reduce selling and inflammation which often leads to decreased pain.  Facet blocks may be done anywhere along the spine from the neck to the low back depending upon the location of your pain.   After numbing the skin with local anesthetic (like Novocaine), a small needle is passed onto the facet joints under x-ray guidance.  You may experience a sensation of pressure while this is being done.  The entire block usually lasts about 15-25 minutes.   Conditions which may be treated by facet blocks:  Low back/buttock pain Neck/shoulder pain Certain types of headaches  Preparation for the injection:  Do not eat any solid food or dairy products within 8 hours of your appointment. You may drink clear liquid up to 3 hours before appointment.  Clear liquids include water, black coffee, juice or soda.  No milk or cream please. You may take your regular medication, including pain medications, with a sip of water before your appointment.  Diabetics should hold regular insulin (if taken separately) and take 1/2 normal NPH dose  the morning of the procedure.  Carry some sugar containing items with you to your appointment. A driver must accompany you and be prepared to drive you home after your procedure. Bring all your current medications with you. An IV may be inserted and sedation may be given at the discretion of the physician. A blood pressure  cuff, EKG and other monitors will often be applied during the procedure.  Some patients may need to have extra oxygen administered for a short period. You will be asked to provide medical information, including your allergies and medications, prior to the procedure.  We must know immediately if you are taking blood thinners (like Coumadin/Warfarin) or if you are allergic to IV iodine contrast (dye).  We must know if you could possible be pregnant.  Possible side-effects:  Bleeding from needle site Infection (rare, may require surgery) Nerve injury (rare) Numbness & tingling (temporary) Difficulty urinating (rare, temporary) Spinal headache (a headache worse with upright posture) Light-headedness (temporary) Pain at injection site (serveral days) Decreased blood pressure (rare, temporary) Weakness in arm/leg (temporary) Pressure sensation in back/neck (temporary)   Call if you experience:  Fever/chills associated with headache or increased back/neck pain Headache worsened by an upright position New onset, weakness or numbness of an extremity below the injection site Hives or difficulty breathing (go to the emergency room) Inflammation or drainage at the injection site(s) Severe back/neck pain greater than usual New symptoms which are concerning to you  Please note:  Although the local anesthetic injected can often make your back or neck feel good for several hours after the injection, the pain will likely return. It takes 3-7 days for steroids to work.  You may not notice any pain relief for at least one week.  If effective, we will often do a series of 2-3 injections spaced 3-6 weeks apart to maximally decrease your pain.  After the initial series, you may be a candidate for a more permanent nerve block of the facets.  If you have any questions, please call [phone] [facility]

## 2023-08-08 LAB — COMPLIANCE DRUG ANALYSIS, UR

## 2023-08-09 ENCOUNTER — Inpatient Hospital Stay: Payer: Medicare HMO | Attending: Oncology

## 2023-08-09 ENCOUNTER — Inpatient Hospital Stay: Payer: Medicare HMO | Admitting: Oncology

## 2023-08-09 ENCOUNTER — Encounter: Payer: Self-pay | Admitting: Oncology

## 2023-08-09 ENCOUNTER — Telehealth: Payer: Self-pay

## 2023-08-09 VITALS — BP 138/54 | HR 95 | Temp 98.2°F | Resp 18 | Wt 149.0 lb

## 2023-08-09 DIAGNOSIS — N6323 Unspecified lump in the left breast, lower outer quadrant: Secondary | ICD-10-CM | POA: Diagnosis not present

## 2023-08-09 DIAGNOSIS — E1140 Type 2 diabetes mellitus with diabetic neuropathy, unspecified: Secondary | ICD-10-CM | POA: Diagnosis not present

## 2023-08-09 DIAGNOSIS — D509 Iron deficiency anemia, unspecified: Secondary | ICD-10-CM

## 2023-08-09 DIAGNOSIS — D500 Iron deficiency anemia secondary to blood loss (chronic): Secondary | ICD-10-CM | POA: Insufficient documentation

## 2023-08-09 DIAGNOSIS — I700 Atherosclerosis of aorta: Secondary | ICD-10-CM | POA: Diagnosis not present

## 2023-08-09 DIAGNOSIS — Z87891 Personal history of nicotine dependence: Secondary | ICD-10-CM | POA: Diagnosis not present

## 2023-08-09 DIAGNOSIS — K5520 Angiodysplasia of colon without hemorrhage: Secondary | ICD-10-CM | POA: Insufficient documentation

## 2023-08-09 DIAGNOSIS — D696 Thrombocytopenia, unspecified: Secondary | ICD-10-CM | POA: Diagnosis not present

## 2023-08-09 DIAGNOSIS — N1832 Chronic kidney disease, stage 3b: Secondary | ICD-10-CM | POA: Diagnosis not present

## 2023-08-09 DIAGNOSIS — Z794 Long term (current) use of insulin: Secondary | ICD-10-CM | POA: Diagnosis not present

## 2023-08-09 DIAGNOSIS — I10 Essential (primary) hypertension: Secondary | ICD-10-CM | POA: Diagnosis not present

## 2023-08-09 DIAGNOSIS — D508 Other iron deficiency anemias: Secondary | ICD-10-CM

## 2023-08-09 DIAGNOSIS — E782 Mixed hyperlipidemia: Secondary | ICD-10-CM | POA: Diagnosis not present

## 2023-08-09 LAB — CBC
HCT: 34.9 % — ABNORMAL LOW (ref 36.0–46.0)
Hemoglobin: 11.4 g/dL — ABNORMAL LOW (ref 12.0–15.0)
MCH: 32.5 pg (ref 26.0–34.0)
MCHC: 32.7 g/dL (ref 30.0–36.0)
MCV: 99.4 fL (ref 80.0–100.0)
Platelets: 104 10*3/uL — ABNORMAL LOW (ref 150–400)
RBC: 3.51 MIL/uL — ABNORMAL LOW (ref 3.87–5.11)
RDW: 15.5 % (ref 11.5–15.5)
WBC: 4.6 10*3/uL (ref 4.0–10.5)
nRBC: 0 % (ref 0.0–0.2)

## 2023-08-09 LAB — IRON AND TIBC
Iron: 61 ug/dL (ref 28–170)
Saturation Ratios: 20 % (ref 10.4–31.8)
TIBC: 307 ug/dL (ref 250–450)
UIBC: 246 ug/dL

## 2023-08-09 LAB — FERRITIN: Ferritin: 74 ng/mL (ref 11–307)

## 2023-08-09 NOTE — Progress Notes (Unsigned)
Patient here for oncology follow-up appointment, concerns of dizziness and weakness

## 2023-08-09 NOTE — Telephone Encounter (Signed)
Called spoke to [place] regarding breast biopsy, stated order is already in from when patient cancelled last time will reach out to patient to schedule

## 2023-08-10 ENCOUNTER — Encounter: Payer: Self-pay | Admitting: Oncology

## 2023-08-10 NOTE — Progress Notes (Signed)
Hematology/Oncology Consult note [facility]  [phone] [phone]  Patient Care Team: Marina Goodell, MD as PCP - General (Family Medicine) [name], MD as Consulting Physician (Oncology)   Name of the patient: Emily Faulkner  [id number]  January 29, 1941   Date of visit: 08/10/23  Diagnosis- 1.  Iron deficiency anemia due to bleeding AVM 2.  Left breast mass pending workup  Chief complaint/ Reason for visit-routine follow-up of anemia and further discussion regarding left breast mass  Heme/Onc history: Patient is a 83 year old female with a past medical history significant for stage III CKD, hypertension diabetes and left renal mass who was found to have a hemoglobin of 8.7 at nephrology office and hence referred the patient for anemia.  For a renal mass she has been followed by Dr. Apolinar Junes in the masses thought to be slow-growing and conservative management was recommended.  Patient presently reports ongoing fatigue.  Denies any blood loss in her stool or urine.  Denies any dark melanotic stools.  Patient has had a complete GI work-up including endoscopy colonoscopy and small bowel endoscopy in 2019 by Dr. Tobi Bastos colonoscopy showed multiple bleeding colonic angioectasias treated with APC small bowel endoscopy showed nonbleeding angioectasias in the jejunum treated with APC   Patient found to have significant iron deficiency anemia with a hemoglobin of 6.9 requiring IV iron.  Also seen by Dr. Allegra Lai and underwent push enteroscopy which did not show any evidence of active bleeding but showed chronic atrophic gastritis   Given persistent iron deficiency anemia due to bleeding AVMs patient was also started on monthly octreotide.  Currently octreotide is on hold  Interval history-patient noted to have left breast mass in October 2024 and was supposed to undergo a biopsy but she kept it on hold because of her ongoing back pain issues for which she  follows up with pain clinic. Presently reports that her energy levels are better and she has not noticed any blood loss in her stool or urine  ECOG PS- 3 Pain scale- 4   Review of systems- Review of Systems  Constitutional:  Positive for malaise/fatigue. Negative for chills, fever and weight loss.  HENT:  Negative for congestion, ear discharge and nosebleeds.   Eyes:  Negative for blurred vision.  Respiratory:  Negative for cough, hemoptysis, sputum production, shortness of breath and wheezing.   Cardiovascular:  Negative for chest pain, palpitations, orthopnea and claudication.  Gastrointestinal:  Negative for abdominal pain, blood in stool, constipation, diarrhea, heartburn, melena, nausea and vomiting.  Genitourinary:  Negative for dysuria, flank pain, frequency, hematuria and urgency.  Musculoskeletal:  Positive for back pain. Negative for joint pain and myalgias.  Skin:  Negative for rash.  Neurological:  Negative for dizziness, tingling, focal weakness, seizures, weakness and headaches.  Endo/Heme/Allergies:  Does not bruise/bleed easily.  Psychiatric/Behavioral:  Negative for depression and suicidal ideas. The patient does not have insomnia.       No Known Allergies   Past Medical History:  Diagnosis Date   Acute kidney failure (HCC) 11/16/2020   AKI (acute kidney injury) (HCC) 03/17/2018   Anemia    Cancer (HCC)    breast cancer right mastectomy   Diabetes mellitus without complication (HCC)    GIB (gastrointestinal bleeding) 02/20/2018   Glaucoma    Hyperkalemia 10/24/2020   Hyperlipidemia    Hypertension    Incontinence of bowel    Neuropathy    Odynophagia 10/24/2020   Osteoarthritis    Presence  of permanent cardiac pacemaker    Rheumatoid arthritis (HCC)    Syncope and collapse 10/24/2020   Telangiectasias      Past Surgical History:  Procedure Laterality Date   BREAST SURGERY     right mastectomy   CHOLECYSTECTOMY     COLONOSCOPY     COLONOSCOPY  WITH PROPOFOL N/A 08/14/2016   Procedure: COLONOSCOPY WITH PROPOFOL;  Surgeon: Christena Deem, MD;  Location: [facility] ENDOSCOPY;  Service: Endoscopy;  Laterality: N/A;   COLONOSCOPY WITH PROPOFOL N/A 02/22/2018   Procedure: COLONOSCOPY WITH PROPOFOL;  Surgeon: Wyline Mood, MD;  Location: [facility] ENDOSCOPY;  Service: Gastroenterology;  Laterality: N/A;   ENTEROSCOPY N/A 04/12/2021   Procedure: ENTEROSCOPY;  Surgeon: Toney Reil, MD;  Location: [facility] ENDOSCOPY;  Service: Gastroenterology;  Laterality: N/A;  push   ESOPHAGOGASTRODUODENOSCOPY (EGD) WITH PROPOFOL N/A 08/14/2016   Procedure: ESOPHAGOGASTRODUODENOSCOPY (EGD) WITH PROPOFOL;  Surgeon: Christena Deem, MD;  Location: [facility] ENDOSCOPY;  Service: Endoscopy;  Laterality: N/A;   ESOPHAGOGASTRODUODENOSCOPY (EGD) WITH PROPOFOL N/A 01/30/2018   Procedure: ESOPHAGOGASTRODUODENOSCOPY (EGD) WITH PROPOFOL;  Surgeon: Toledo, Boykin Nearing, MD;  Location: [facility] ENDOSCOPY;  Service: Gastroenterology;  Laterality: N/A;   ESOPHAGOGASTRODUODENOSCOPY (EGD) WITH PROPOFOL N/A 02/22/2018   Procedure: ESOPHAGOGASTRODUODENOSCOPY (EGD) WITH PROPOFOL;  Surgeon: Wyline Mood, MD;  Location: [facility] ENDOSCOPY;  Service: Gastroenterology;  Laterality: N/A;   EXCISIONAL HEMORRHOIDECTOMY     IR KYPHO THORACIC WITH BONE BIOPSY  05/14/2023   IR RADIOLOGIST EVAL & MGMT  08/07/2022   IR RADIOLOGIST EVAL & MGMT  09/12/2022   IR RADIOLOGIST EVAL & MGMT  05/02/2023   IR RADIOLOGIST EVAL & MGMT  05/02/2023   IR RADIOLOGIST EVAL & MGMT  05/23/2023   KYPHOPLASTY N/A 03/20/2018   Procedure: [id number];  Surgeon: Kennedy Bucker, MD;  Location: [facility] ORS;  Service: Orthopedics;  Laterality: N/A;   PACEMAKER IMPLANT N/A 10/26/2020   Procedure: PACEMAKER IMPLANT;  Surgeon: Marcina Millard, MD;  Location: [facility] INVASIVE CV LAB;  Service: Cardiovascular;  Laterality: N/A;   RADIOLOGY WITH ANESTHESIA N/A 08/31/2022   Procedure: CT renal microwave ablation;  Surgeon: Pernell Dupre, MD;   Location: [facility] OR;  Service: Radiology;  Laterality: N/A;    Social History   Socioeconomic History   Marital status: Widowed    Spouse name: Not on file   Number of children: Not on file   Years of education: Not on file   Highest education level: Not on file  Occupational History   Not on file  Tobacco Use   Smoking status: Former    Current packs/day: 0.00    Types: Cigarettes    Quit date: 07/10/2011    Years since quitting: 12.0   Smokeless tobacco: Never  Vaping Use   Vaping status: Never Used  Substance and Sexual Activity   Alcohol use: No   Drug use: No   Sexual activity: Not Currently  Other Topics Concern   Not on file  Social History Narrative   Not on file   Social Drivers of Health   Financial Resource Strain: Low Risk  (03/27/2023)   Received from [facility]   Overall Financial Resource Strain (CARDIA)    Difficulty of Paying Living Expenses: Not hard at all  Food Insecurity: No Food Insecurity (05/28/2023)   Hunger Vital Sign    Worried About Running Out of Food in the Last Year: Never true    Ran Out of Food in the Last Year: Never true  Transportation Needs: No Transportation Needs (  05/28/2023)   PRAPARE - Administrator, Civil Service (Medical): No    Lack of Transportation (Non-Medical): No  Physical Activity: Not on file  Stress: Not on file  Social Connections: Not on file  Intimate Partner Violence: Not At Risk (05/28/2023)   Humiliation, Afraid, Rape, and Kick questionnaire    Fear of Current or Ex-Partner: No    Emotionally Abused: No    Physically Abused: No    Sexually Abused: No    Family History  Problem Relation Age of Onset   Kidney cancer Son    Bladder Cancer Neg Hx    Breast cancer Neg Hx      Current Outpatient Medications:    ACCU-CHEK AVIVA PLUS test strip, , Disp: , Rfl:    ACCU-CHEK SOFTCLIX LANCETS lancets, once daily Use as instructed., Disp: , Rfl:    alendronate (FOSAMAX) 70 MG  tablet, TAKE 1 TABLET BY MOUTH EVERY 7DAYS WITH A FULL GLASS OF WATER. DO NOT LIE DOWN FOR 30 MINUTES, Disp: , Rfl:    Biotin 1000 MCG tablet, Take 1,000 mcg by mouth daily., Disp: , Rfl:    Calcium Carb-Cholecalciferol (OYSTER SHELL CALCIUM W/D) 500-5 MG-MCG TABS, Take 1 tablet by mouth daily., Disp: , Rfl:    Cholecalciferol 50 MCG (2000 UT) CAPS, Take 2,000 Units by mouth daily., Disp: , Rfl:    feeding supplement, ENSURE ENLIVE, (ENSURE ENLIVE) LIQD, Take 237 mLs by mouth 2 (two) times daily between meals., Disp: 237 mL, Rfl: 12   HYDROcodone-acetaminophen (NORCO) 5-325 MG tablet, Take 1 tablet by mouth every 8 (eight) hours as needed for severe pain (pain score 7-10)., Disp: 10 tablet, Rfl: 0   insulin glargine (LANTUS) 100 UNIT/ML Solostar Pen, Inject 15 Units into the skin daily. (may take up to 50u based upon blood glucose reading) (Patient taking differently: Inject 28 Units into the skin daily. (may take up to 50u based upon blood glucose reading)), Disp: 15 mL, Rfl: 11   Insulin Pen Needle 32G X 4 MM MISC, USE 1 NEEDLE SUBCUTANEOUSLY ONCE A DAY AS DIRECTED, Disp: , Rfl:    losartan (COZAAR) 50 MG tablet, Take 25 mg by mouth daily., Disp: , Rfl:    niacin 500 MG CR capsule, Take 500 mg by mouth at bedtime., Disp: , Rfl:    omeprazole (PRILOSEC) 40 MG capsule, TAKE 1 CAPSULE BY MOUTH EVERY DAY BEFORE BREAKFAST, Disp: 30 capsule, Rfl: 0   Probiotic Product (PROBIOTIC PO), Take 1 tablet by mouth daily. 2 billion, Disp: , Rfl:    simvastatin (ZOCOR) 20 MG tablet, Take 20 mg by mouth at bedtime., Disp: , Rfl: 6   sitaGLIPtin (JANUVIA) 50 MG tablet, Take 50 mg by mouth daily., Disp: , Rfl:    TRAVATAN Z 0.004 % SOLN ophthalmic solution, Place 1 drop into both eyes at bedtime., Disp: , Rfl:    TURMERIC PO, Take 538 mg by mouth daily., Disp: , Rfl:    vitamin B-12 (CYANOCOBALAMIN) 1000 MCG tablet, Take 2,000 mcg by mouth daily., Disp: , Rfl:   Physical exam:  Vitals:   08/09/23 1416  BP: (!)  138/54  Pulse: 95  Resp: 18  Temp: 98.2 F (36.8 C)  TempSrc: Oral  SpO2: 98%  Weight: 149 lb (67.6 kg)   Physical Exam Constitutional:      Comments: Sitting in a wheelchair.  Appears in no acute distress  Cardiovascular:     Rate and Rhythm: Normal rate and regular rhythm.  Heart sounds: Normal heart sounds.  Pulmonary:     Effort: Pulmonary effort is normal.     Breath sounds: Normal breath sounds.  Abdominal:     General: Bowel sounds are normal.     Palpations: Abdomen is soft.  Skin:    General: Skin is warm and dry.  Neurological:     Mental Status: She is alert and oriented to person, place, and time.     Breast exam: There is a hard palpable mass roughly 4 cm in size palpated in the upper outer quadrant of the left breast between 1 and 2 o'clock position which appears to be tethered to her skin.      Latest Ref Rng & Units 06/17/2023    1:58 PM  CMP  Glucose 70 - 99 mg/dL 161   BUN 8 - 27 mg/dL 8   Creatinine 0.96 - 0.45 mg/dL 4.09   Sodium 811 - 914 mmol/L 143   Potassium 3.5 - 5.2 mmol/L 3.6   Chloride 96 - 106 mmol/L 108   CO2 20 - 29 mmol/L 20   Calcium 8.7 - 10.3 mg/dL 9.9   Total Protein 6.0 - 8.5 g/dL 6.6   Total Bilirubin 0.0 - 1.2 mg/dL 1.1   Alkaline Phos 44 - 121 IU/L 103   AST 0 - 40 IU/L 33   ALT 0 - 32 IU/L 16       Latest Ref Rng & Units 08/09/2023    1:55 PM  CBC  WBC 4.0 - 10.5 K/uL 4.6   Hemoglobin 12.0 - 15.0 g/dL 78.2   Hematocrit 95.6 - 46.0 % 34.9   Platelets 150 - 400 K/uL 104     No images are attached to the encounter.  No results found.   Assessment and plan- Patient is a 83 y.o. female here for follow-up of following issues:  Iron deficiency anemia: Patient's baseline hemoglobin runs between 10.5-11.5 and its present.  11.4.  Iron studies are within normal limits with a ferritin level of 74 and iron saturation of 20%.  She does not require any IV iron at this time.  I will check CBC ferritin and iron studies in 3  and 6 months and see her back in 6 months.  Octreotide is on hold for now.  Thrombocytopenia fluctuates between 80s to 100s.  Overall stable continue to monitor  Left breast mass: Patient was noted to have left breast mass when she had a CT chest done in September 2024.  This was followed by diagnostic mammogram which showed a 3.2 cm mass in the upper outer quadrant of the breast and there was also a concerning intramammary lymph node noted at that time.  She was recommended biopsy but she reported off in view of her back pain.  I discussed with her and her son in detail that it is very important to get a biopsy to know if this is hormone driven breast cancer versus ER/PR negative or HER2 positive tumor which would guide treatment.  This is highly concerning for breast cancer and she will also need surgery at some point.  For now patient agrees to proceed with breast biopsy which we will schedule.   Visit Diagnosis 1. Mass of lower outer quadrant of left breast   2. Iron deficiency anemia, unspecified iron deficiency anemia type      Dr. Owens Shark, MD, MPH [facility] [phone] 08/10/2023 12:45 PM

## 2023-08-20 DIAGNOSIS — I442 Atrioventricular block, complete: Secondary | ICD-10-CM | POA: Diagnosis not present

## 2023-08-21 ENCOUNTER — Ambulatory Visit
Admission: RE | Admit: 2023-08-21 | Discharge: 2023-08-21 | Disposition: A | Discharge status: Home / Self Care | Payer: Medicare HMO | Source: Ambulatory Visit | Attending: Oncology | Admitting: Oncology

## 2023-08-21 ENCOUNTER — Ambulatory Visit
Admission: RE | Admit: 2023-08-21 | Discharge: 2023-08-21 | Disposition: A | Discharge status: Home / Self Care | Payer: Medicare HMO | Source: Ambulatory Visit | Attending: Oncology

## 2023-08-21 DIAGNOSIS — R928 Other abnormal and inconclusive findings on diagnostic imaging of breast: Secondary | ICD-10-CM

## 2023-08-21 DIAGNOSIS — N6323 Unspecified lump in the left breast, lower outer quadrant: Secondary | ICD-10-CM | POA: Diagnosis not present

## 2023-08-21 DIAGNOSIS — R599 Enlarged lymph nodes, unspecified: Secondary | ICD-10-CM | POA: Insufficient documentation

## 2023-08-21 DIAGNOSIS — C50412 Malignant neoplasm of upper-outer quadrant of left female breast: Secondary | ICD-10-CM | POA: Diagnosis not present

## 2023-08-21 DIAGNOSIS — C773 Secondary and unspecified malignant neoplasm of axilla and upper limb lymph nodes: Secondary | ICD-10-CM | POA: Diagnosis not present

## 2023-08-21 DIAGNOSIS — C771 Secondary and unspecified malignant neoplasm of intrathoracic lymph nodes: Secondary | ICD-10-CM | POA: Diagnosis not present

## 2023-08-21 DIAGNOSIS — N63 Unspecified lump in unspecified breast: Secondary | ICD-10-CM

## 2023-08-21 HISTORY — PX: BREAST BIOPSY: SHX20

## 2023-08-21 MED ORDER — LIDOCAINE-EPINEPHRINE 1 %-1:100000 IJ SOLN
5.0000 mL | Freq: Once | INTRAMUSCULAR | Status: AC
  Administered 2023-08-21: 5 mL
  Filled 2023-08-21: qty 5

## 2023-08-21 MED ORDER — LIDOCAINE 1 % OPTIME INJ - NO CHARGE
2.0000 mL | Freq: Once | INTRAMUSCULAR | Status: AC
  Administered 2023-08-21: 2 mL
  Filled 2023-08-21: qty 2

## 2023-08-22 ENCOUNTER — Telehealth: Payer: Self-pay | Admitting: *Deleted

## 2023-08-22 LAB — SURGICAL PATHOLOGY

## 2023-08-22 NOTE — Telephone Encounter (Signed)
The staff at the diagnostic  radiology states that the pt. Has left breast cancer and axillary mets. Patient was give the results and Bonita Quin says that she sent message to [facility] as well as calling it in that the 2 sons wants to come to see Smith [facility] about this. Bonita Quin told them that someone in the [employer] team will call the pt. And give her a date  to see her

## 2023-08-22 NOTE — Telephone Encounter (Signed)
Emily Faulkner- please let patients family know I will need ER PR and her status before deciding how this can be treated and it is not back yet.we will need to wait for 7-10 days for those to be resulted Megan- I will see her 10 days from now. No labs

## 2023-08-23 ENCOUNTER — Telehealth: Payer: Self-pay | Admitting: Student in an Organized Health Care Education/Training Program

## 2023-08-23 ENCOUNTER — Ambulatory Visit: Payer: Medicare HMO | Admitting: Orthopedic Surgery

## 2023-08-23 NOTE — Telephone Encounter (Signed)
Returned patient phone call, she had a recent bx and has been dx with breast cancer.  She is going to meet with the folks at the cancer center next week and feels that she should hold off on any other types of procedures until she finds out the plan of care r/t the breast cancer.  I told her I would let Dr Cherylann Ratel know this information and we will wait to hear back from her when she is ready.  Patient verbalizes u/o.

## 2023-08-23 NOTE — Telephone Encounter (Signed)
PT called stated that she had biopsy done on Wed,and they call patient on yesterday to in form her that she has cancer. PT states that she has some question about her appointment that she has coming up on Monday.Please give patient a call. TY

## 2023-08-23 NOTE — Telephone Encounter (Signed)
Ana- please keep her on your radar and let me know when er pr and her 2 are back. Megan- please schedule f/u in 10 days

## 2023-08-26 ENCOUNTER — Ambulatory Visit: Payer: Medicare HMO | Admitting: Student in an Organized Health Care Education/Training Program

## 2023-08-27 ENCOUNTER — Ambulatory Visit: Payer: Medicare HMO | Admitting: Gastroenterology

## 2023-08-30 ENCOUNTER — Encounter: Payer: Self-pay | Admitting: Oncology

## 2023-08-30 ENCOUNTER — Inpatient Hospital Stay: Payer: Medicare HMO | Attending: Oncology | Admitting: Oncology

## 2023-08-30 VITALS — BP 123/53 | HR 97 | Temp 98.0°F | Resp 18 | Wt 147.0 lb

## 2023-08-30 DIAGNOSIS — I129 Hypertensive chronic kidney disease with stage 1 through stage 4 chronic kidney disease, or unspecified chronic kidney disease: Secondary | ICD-10-CM | POA: Insufficient documentation

## 2023-08-30 DIAGNOSIS — C50412 Malignant neoplasm of upper-outer quadrant of left female breast: Secondary | ICD-10-CM | POA: Diagnosis not present

## 2023-08-30 DIAGNOSIS — Z79899 Other long term (current) drug therapy: Secondary | ICD-10-CM | POA: Insufficient documentation

## 2023-08-30 DIAGNOSIS — Z9011 Acquired absence of right breast and nipple: Secondary | ICD-10-CM | POA: Diagnosis not present

## 2023-08-30 DIAGNOSIS — Z794 Long term (current) use of insulin: Secondary | ICD-10-CM | POA: Insufficient documentation

## 2023-08-30 DIAGNOSIS — Z79810 Long term (current) use of selective estrogen receptor modulators (SERMs): Secondary | ICD-10-CM | POA: Insufficient documentation

## 2023-08-30 DIAGNOSIS — Z1732 Human epidermal growth factor receptor 2 negative status: Secondary | ICD-10-CM | POA: Diagnosis not present

## 2023-08-30 DIAGNOSIS — N183 Chronic kidney disease, stage 3 unspecified: Secondary | ICD-10-CM | POA: Diagnosis not present

## 2023-08-30 DIAGNOSIS — Z170 Estrogen receptor positive status [ER+]: Secondary | ICD-10-CM | POA: Diagnosis not present

## 2023-08-30 DIAGNOSIS — Z87891 Personal history of nicotine dependence: Secondary | ICD-10-CM | POA: Insufficient documentation

## 2023-08-30 DIAGNOSIS — Z7984 Long term (current) use of oral hypoglycemic drugs: Secondary | ICD-10-CM | POA: Insufficient documentation

## 2023-08-30 DIAGNOSIS — Z1721 Progesterone receptor positive status: Secondary | ICD-10-CM | POA: Diagnosis not present

## 2023-08-30 DIAGNOSIS — D500 Iron deficiency anemia secondary to blood loss (chronic): Secondary | ICD-10-CM | POA: Diagnosis not present

## 2023-08-30 DIAGNOSIS — E1122 Type 2 diabetes mellitus with diabetic chronic kidney disease: Secondary | ICD-10-CM | POA: Diagnosis not present

## 2023-08-30 DIAGNOSIS — Z7189 Other specified counseling: Secondary | ICD-10-CM | POA: Diagnosis not present

## 2023-08-30 DIAGNOSIS — K2940 Chronic atrophic gastritis without bleeding: Secondary | ICD-10-CM | POA: Insufficient documentation

## 2023-08-30 MED ORDER — TAMOXIFEN CITRATE 20 MG PO TABS: 20.0000 mg | ORAL_TABLET | Freq: Every day | ORAL | 3 refills | Status: DC

## 2023-08-30 NOTE — Progress Notes (Signed)
Hematology/Oncology Consult note [facility]  [phone] [phone]  Patient Care Team: Marina Goodell, MD as PCP - General (Family Medicine) [name], MD as Consulting Physician (Oncology)   Name of the patient: Emily Faulkner  [id number]  [birth year]   Date of visit: 08/30/23  Diagnosis- 1.  Iron deficiency anemia due to bleeding AVM 2.  Invasive mammary carcinoma of the left breast cT2 N1 MX ER/PR positive HER2 negative    Chief complaint/ Reason for visit-discuss breast biopsy results and further management  Heme/Onc history: Patient is a 83 year old female with a past medical history significant for stage III CKD, hypertension diabetes and left renal mass who was found to have a hemoglobin of 8.7 at nephrology office and hence referred the patient for anemia.  For a renal mass she has been followed by Dr. Apolinar Junes in the masses thought to be slow-growing and conservative management was recommended.  Patient presently reports ongoing fatigue.  Denies any blood loss in her stool or urine.  Denies any dark melanotic stools.  Patient has had a complete GI work-up including endoscopy colonoscopy and small bowel endoscopy in 2019 by Dr. Tobi Bastos colonoscopy showed multiple bleeding colonic angioectasias treated with APC small bowel endoscopy showed nonbleeding angioectasias in the jejunum treated with APC   Patient found to have significant iron deficiency anemia with a hemoglobin of 6.9 requiring IV iron.  Also seen by Dr. Allegra Lai and underwent push enteroscopy which did not show any evidence of active bleeding but showed chronic atrophic gastritis   Given persistent iron deficiency anemia due to bleeding AVMs patient was also started on monthly octreotide.  Currently octreotide is on hold  Patient was incidentally noted to have left breast mass on CT scans in September 2024 this was followed by diagnostic left breast mammogram which showed a  3.2 cm mass in the left breast.  Along the lateral margins of the mass there was an enlarged intramammary lymph node.  Left axilla could not be well assessed due to patient positioning.  She had biopsy of the breast mass as well as the lymph node which showed grade 2 invasive mammary carcinoma with mixed ductal and lobular features ER 95% positive PR 100% positive HER2 negative Ki-67 15%.  Patient has a prior history of a right breast cancer over 30 years ago s/p right mastectomy and she took tamoxifen at that time for 5 years  Interval history-no acute issues since last visit.  She is here with her son to discuss breast biopsy results  ECOG PS- 3 Pain scale- 4   Review of systems- Review of Systems  Constitutional:  Positive for malaise/fatigue. Negative for chills, fever and weight loss.  HENT:  Negative for congestion, ear discharge and nosebleeds.   Eyes:  Negative for blurred vision.  Respiratory:  Negative for cough, hemoptysis, sputum production, shortness of breath and wheezing.   Cardiovascular:  Negative for chest pain, palpitations, orthopnea and claudication.  Gastrointestinal:  Negative for abdominal pain, blood in stool, constipation, diarrhea, heartburn, melena, nausea and vomiting.  Genitourinary:  Negative for dysuria, flank pain, frequency, hematuria and urgency.  Musculoskeletal:  Positive for back pain. Negative for joint pain and myalgias.  Skin:  Negative for rash.  Neurological:  Negative for dizziness, tingling, focal weakness, seizures, weakness and headaches.  Endo/Heme/Allergies:  Does not bruise/bleed easily.  Psychiatric/Behavioral:  Negative for depression and suicidal ideas. The patient does not have insomnia.  No Known Allergies   Past Medical History:  Diagnosis Date   Acute kidney failure (HCC) 11/16/2020   AKI (acute kidney injury) (HCC) 03/17/2018   Anemia    Cancer (HCC)    breast cancer right mastectomy   Diabetes mellitus without  complication (HCC)    GIB (gastrointestinal bleeding) 02/20/2018   Glaucoma    Hyperkalemia 10/24/2020   Hyperlipidemia    Hypertension    Incontinence of bowel    Neuropathy    Odynophagia 10/24/2020   Osteoarthritis    Presence of permanent cardiac pacemaker    Rheumatoid arthritis (HCC)    Syncope and collapse 10/24/2020   Telangiectasias      Past Surgical History:  Procedure Laterality Date   BREAST BIOPSY Left 08/21/2023   [place] LT BREAST BX W LOC DEV 1ST LESION IMG BX SPEC US GUIDE 08/21/2023 [facility]   BREAST BIOPSY Left 08/21/2023   [place] LT BREAST BX W LOC DEV EA ADD LESION IMG BX SPEC US GUIDE 08/21/2023 [facility]   BREAST SURGERY     right mastectomy   CHOLECYSTECTOMY     COLONOSCOPY     COLONOSCOPY WITH PROPOFOL N/A 08/14/2016   Procedure: COLONOSCOPY WITH PROPOFOL;  Surgeon: Christena Deem, MD;  Location: [facility] ENDOSCOPY;  Service: Endoscopy;  Laterality: N/A;   COLONOSCOPY WITH PROPOFOL N/A 02/22/2018   Procedure: COLONOSCOPY WITH PROPOFOL;  Surgeon: Wyline Mood, MD;  Location: [facility] ENDOSCOPY;  Service: Gastroenterology;  Laterality: N/A;   ENTEROSCOPY N/A 04/12/2021   Procedure: ENTEROSCOPY;  Surgeon: Toney Reil, MD;  Location: [facility] ENDOSCOPY;  Service: Gastroenterology;  Laterality: N/A;  push   ESOPHAGOGASTRODUODENOSCOPY (EGD) WITH PROPOFOL N/A 08/14/2016   Procedure: ESOPHAGOGASTRODUODENOSCOPY (EGD) WITH PROPOFOL;  Surgeon: Christena Deem, MD;  Location: [facility] ENDOSCOPY;  Service: Endoscopy;  Laterality: N/A;   ESOPHAGOGASTRODUODENOSCOPY (EGD) WITH PROPOFOL N/A 01/30/2018   Procedure: ESOPHAGOGASTRODUODENOSCOPY (EGD) WITH PROPOFOL;  Surgeon: Toledo, Boykin Nearing, MD;  Location: [facility] ENDOSCOPY;  Service: Gastroenterology;  Laterality: N/A;   ESOPHAGOGASTRODUODENOSCOPY (EGD) WITH PROPOFOL N/A 02/22/2018   Procedure: ESOPHAGOGASTRODUODENOSCOPY (EGD) WITH PROPOFOL;  Surgeon: Wyline Mood, MD;  Location: [facility] ENDOSCOPY;  Service: Gastroenterology;   Laterality: N/A;   EXCISIONAL HEMORRHOIDECTOMY     IR KYPHO THORACIC WITH BONE BIOPSY  05/14/2023   IR RADIOLOGIST EVAL & MGMT  08/07/2022   IR RADIOLOGIST EVAL & MGMT  09/12/2022   IR RADIOLOGIST EVAL & MGMT  05/02/2023   IR RADIOLOGIST EVAL & MGMT  05/02/2023   IR RADIOLOGIST EVAL & MGMT  05/23/2023   KYPHOPLASTY N/A 03/20/2018   Procedure: [id number];  Surgeon: Kennedy Bucker, MD;  Location: [facility] ORS;  Service: Orthopedics;  Laterality: N/A;   PACEMAKER IMPLANT N/A 10/26/2020   Procedure: PACEMAKER IMPLANT;  Surgeon: Marcina Millard, MD;  Location: [facility] INVASIVE CV LAB;  Service: Cardiovascular;  Laterality: N/A;   RADIOLOGY WITH ANESTHESIA N/A 08/31/2022   Procedure: CT renal microwave ablation;  Surgeon: Pernell Dupre, MD;  Location: [facility] OR;  Service: Radiology;  Laterality: N/A;    Social History   Socioeconomic History   Marital status: Widowed    Spouse name: Not on file   Number of children: Not on file   Years of education: Not on file   Highest education level: Not on file  Occupational History   Not on file  Tobacco Use   Smoking status: Former    Current packs/day: 0.00    Types: Cigarettes    Quit date: 07/10/2011    Years since quitting: 12.1  Smokeless tobacco: Never  Vaping Use   Vaping status: Never Used  Substance and Sexual Activity   Alcohol use: No   Drug use: No   Sexual activity: Not Currently  Other Topics Concern   Not on file  Social History Narrative   Not on file   Social Drivers of Health   Financial Resource Strain: Low Risk  (03/27/2023)   Received from [facility]   Overall Financial Resource Strain (CARDIA)    Difficulty of Paying Living Expenses: Not hard at all  Food Insecurity: No Food Insecurity (05/28/2023)   Hunger Vital Sign    Worried About Running Out of Food in the Last Year: Never true    Ran Out of Food in the Last Year: Never true  Transportation Needs: No Transportation Needs (05/28/2023)    PRAPARE - Administrator, Civil Service (Medical): No    Lack of Transportation (Non-Medical): No  Physical Activity: Not on file  Stress: Not on file  Social Connections: Not on file  Intimate Partner Violence: Not At Risk (05/28/2023)   Humiliation, Afraid, Rape, and Kick questionnaire    Fear of Current or Ex-Partner: No    Emotionally Abused: No    Physically Abused: No    Sexually Abused: No    Family History  Problem Relation Age of Onset   Kidney cancer Son    Bladder Cancer Neg Hx    Breast cancer Neg Hx      Current Outpatient Medications:    tamoxifen (NOLVADEX) 20 MG tablet, Take 1 tablet (20 mg total) by mouth daily., Disp: 30 tablet, Rfl: 3   ACCU-CHEK AVIVA PLUS test strip, , Disp: , Rfl:    ACCU-CHEK SOFTCLIX LANCETS lancets, once daily Use as instructed., Disp: , Rfl:    alendronate (FOSAMAX) 70 MG tablet, TAKE 1 TABLET BY MOUTH EVERY 7DAYS WITH A FULL GLASS OF WATER. DO NOT LIE DOWN FOR 30 MINUTES, Disp: , Rfl:    Biotin 1000 MCG tablet, Take 1,000 mcg by mouth daily., Disp: , Rfl:    Calcium Carb-Cholecalciferol (OYSTER SHELL CALCIUM W/D) 500-5 MG-MCG TABS, Take 1 tablet by mouth daily., Disp: , Rfl:    Cholecalciferol 50 MCG (2000 UT) CAPS, Take 2,000 Units by mouth daily., Disp: , Rfl:    feeding supplement, ENSURE ENLIVE, (ENSURE ENLIVE) LIQD, Take 237 mLs by mouth 2 (two) times daily between meals., Disp: 237 mL, Rfl: 12   HYDROcodone-acetaminophen (NORCO) 5-325 MG tablet, Take 1 tablet by mouth every 8 (eight) hours as needed for severe pain (pain score 7-10)., Disp: 10 tablet, Rfl: 0   insulin glargine (LANTUS) 100 UNIT/ML Solostar Pen, Inject 15 Units into the skin daily. (may take up to 50u based upon blood glucose reading) (Patient taking differently: Inject 28 Units into the skin daily. (may take up to 50u based upon blood glucose reading)), Disp: 15 mL, Rfl: 11   Insulin Pen Needle 32G X 4 MM MISC, USE 1 NEEDLE SUBCUTANEOUSLY ONCE A DAY AS  DIRECTED, Disp: , Rfl:    losartan (COZAAR) 50 MG tablet, Take 25 mg by mouth daily., Disp: , Rfl:    niacin 500 MG CR capsule, Take 500 mg by mouth at bedtime., Disp: , Rfl:    omeprazole (PRILOSEC) 40 MG capsule, TAKE 1 CAPSULE BY MOUTH EVERY DAY BEFORE BREAKFAST, Disp: 30 capsule, Rfl: 0   Probiotic Product (PROBIOTIC PO), Take 1 tablet by mouth daily. 2 billion, Disp: , Rfl:  simvastatin (ZOCOR) 20 MG tablet, Take 20 mg by mouth at bedtime., Disp: , Rfl: 6   sitaGLIPtin (JANUVIA) 50 MG tablet, Take 50 mg by mouth daily., Disp: , Rfl:    TRAVATAN Z 0.004 % SOLN ophthalmic solution, Place 1 drop into both eyes at bedtime., Disp: , Rfl:    TURMERIC PO, Take 538 mg by mouth daily., Disp: , Rfl:    vitamin B-12 (CYANOCOBALAMIN) 1000 MCG tablet, Take 2,000 mcg by mouth daily., Disp: , Rfl:   Physical exam:  Vitals:   08/30/23 1141  BP: (!) 123/53  Pulse: 97  Resp: 18  Temp: 98 F (36.7 C)  TempSrc: Tympanic  SpO2: 100%  Weight: 147 lb (66.7 kg)   Physical Exam Constitutional:      Comments: Elderly frail woman sitting in a wheelchair.  Appears in no acute distress  Cardiovascular:     Rate and Rhythm: Normal rate and regular rhythm.     Heart sounds: Normal heart sounds.  Pulmonary:     Effort: Pulmonary effort is normal.     Breath sounds: Normal breath sounds.  Abdominal:     General: Bowel sounds are normal.     Palpations: Abdomen is soft.  Skin:    General: Skin is warm and dry.  Neurological:     Mental Status: She is alert and oriented to person, place, and time.         Latest Ref Rng & Units 06/17/2023    1:58 PM  CMP  Glucose 70 - 99 mg/dL 478   BUN 8 - 27 mg/dL 8   Creatinine 2.95 - 6.21 mg/dL 3.08   Sodium 657 - 846 mmol/L 143   Potassium 3.5 - 5.2 mmol/L 3.6   Chloride 96 - 106 mmol/L 108   CO2 20 - 29 mmol/L 20   Calcium 8.7 - 10.3 mg/dL 9.9   Total Protein 6.0 - 8.5 g/dL 6.6   Total Bilirubin 0.0 - 1.2 mg/dL 1.1   Alkaline Phos 44 - 121 IU/L 103    AST 0 - 40 IU/L 33   ALT 0 - 32 IU/L 16       Latest Ref Rng & Units 08/09/2023    1:55 PM  CBC  WBC 4.0 - 10.5 K/uL 4.6   Hemoglobin 12.0 - 15.0 g/dL 96.2   Hematocrit 95.2 - 46.0 % 34.9   Platelets 150 - 400 K/uL 104     No images are attached to the encounter.  [place] LT BREAST BX W LOC DEV 1ST LESION IMG BX SPEC US GUIDE Addendum Date: 08/22/2023 ADDENDUM REPORT: 08/22/2023 17:02 ADDENDUM: PATHOLOGY revealed: 1. Breast, left, needle core biopsy, 1:30 9 cmfn : INVASIVE DUCTAL CARCINOMA. OVERALL GRADE: 2. LYMPHOVASCULAR INVASION: IDENTIFIED. PERINEURAL INVASION: IDENTIFIED. CANCER LENGTH: 1.7 CM. CALCIFICATIONS: NOT IDENTIFIED. 2. Breast, left, needle core biopsy, 1:30 o'clock; 10 cmfn : ONE LYMPH NODE, POSITIVE FOR METASTATIC CARCINOMA (0/1). Pathology results are CONCORDANT with imaging findings, per Dr. Meda Klinefelter. Pathology results and recommendations were discussed with patient via telephone on 08/22/2023 by Randa Lynn RN. Patient reported biopsy site doing well with no adverse symptoms, and only slight tenderness at the site. Post biopsy care instructions were reviewed, questions were answered and my direct phone number was provided. Patient was instructed to call [facility] for any additional questions or concerns related to biopsy site. RECOMMENDATIONS: 1. Surgical and oncological consultation. Per patient request, appointment with provider (Dr. Owens Shark) was requested by Randa Lynn RN on  08/22/2023 to discuss treatment plan. Patient stated she did not think she could tolerate surgery, but wanted to talk it over with her sons and her oncologist, Dr. Owens Shark, before making any decisions. Patient requested provider to make the surgical consultation referral if patient decides to pursue surgery. Randa Lynn RN sent Epic in-basket to provider on 08/22/2023 with request for patient appointment and potential surgical request. Provider office was also notified to contact patient  with appointment date and time. Pathology results reported by Randa Lynn RN on 08/22/2023. Electronically Signed   By: Meda Klinefelter M.D.   On: 08/22/2023 17:02   Result Date: 08/22/2023 CLINICAL DATA:  Suspicious palpable LEFT breast mass and adjacent low axillary node EXAM: ULTRASOUND GUIDED LEFT BREAST CORE NEEDLE BIOPSY x2 COMPARISON:  Previous exam(s). PROCEDURE: I met with the patient and we discussed the procedure of ultrasound-guided biopsy, including benefits and alternatives. We discussed the high likelihood of a successful procedure. We discussed the risks of the procedure, including infection, bleeding, tissue injury, clip migration, and inadequate sampling. Informed written consent was given. The usual time-out protocol was performed immediately prior to the procedure. Targeted ultrasound performed of the LEFT axilla. There are 2 mildly enlarged LEFT axillary lymph nodes with cortical thickening of approximately 5 mm. The inferior-most corresponds to the node described on prior diagnostic workup from October 2024. Site 1: LEFT upper outer breast 1:30 9 cm from the nipple palpable mass Lesion quadrant: Upper outer quadrant Using sterile technique and 1% lidocaine and 1% lidocaine with epinephrine as local anesthetic, under direct ultrasound visualization, a 14 gauge spring-loaded device was used to perform biopsy of a mass at 1:30 9 cm from the nipple using a lateral approach. At the conclusion of the procedure a COIL shaped tissue marker clip was deployed into the biopsy cavity. Placement was verified sonographically. Follow up 2 view mammogram was performed and dictated separately. Site 2: LEFT upper outer breast 1:30 10 cm from the nipple, adjacent low axillary node. Lesion quadrant: Upper outer quadrant Using sterile technique and 1% lidocaine and 1% lidocaine with epinephrine as local anesthetic, under direct ultrasound visualization, a 14 gauge spring-loaded device was used to perform biopsy  of a mass at 1:30 10 cm from the nipple using a lateral approach. At the conclusion of the procedure a venus shaped tissue marker clip was deployed into the biopsy cavity. Placement was verified sonographically. Follow up 2 view mammogram was performed and dictated separately. IMPRESSION: 1. Ultrasound guided biopsy of a LEFT breast mass and adjacent low axillary lymph node. No apparent complications. 2. There are 2 mildly enlarged level 1 LEFT axillary lymph nodes, inclusive of the low axillary lymph node. Electronically Signed: By: Meda Klinefelter M.D. On: 08/21/2023 14:29   [place] LT BREAST BX W LOC DEV EA ADD LESION IMG BX SPEC US GUIDE Addendum Date: 08/22/2023 ADDENDUM REPORT: 08/22/2023 17:02 ADDENDUM: PATHOLOGY revealed: 1. Breast, left, needle core biopsy, 1:30 9 cmfn : INVASIVE DUCTAL CARCINOMA. OVERALL GRADE: 2. LYMPHOVASCULAR INVASION: IDENTIFIED. PERINEURAL INVASION: IDENTIFIED. CANCER LENGTH: 1.7 CM. CALCIFICATIONS: NOT IDENTIFIED. 2. Breast, left, needle core biopsy, 1:30 o'clock; 10 cmfn : ONE LYMPH NODE, POSITIVE FOR METASTATIC CARCINOMA (0/1). Pathology results are CONCORDANT with imaging findings, per Dr. Meda Klinefelter. Pathology results and recommendations were discussed with patient via telephone on 08/22/2023 by Randa Lynn RN. Patient reported biopsy site doing well with no adverse symptoms, and only slight tenderness at the site. Post biopsy care instructions were reviewed, questions were answered and my  direct phone number was provided. Patient was instructed to call [facility] for any additional questions or concerns related to biopsy site. RECOMMENDATIONS: 1. Surgical and oncological consultation. Per patient request, appointment with provider (Dr. Owens Shark) was requested by Randa Lynn RN on 08/22/2023 to discuss treatment plan. Patient stated she did not think she could tolerate surgery, but wanted to talk it over with her sons and her oncologist, Dr. Owens Shark, before  making any decisions. Patient requested provider to make the surgical consultation referral if patient decides to pursue surgery. Randa Lynn RN sent Epic in-basket to provider on 08/22/2023 with request for patient appointment and potential surgical request. Provider office was also notified to contact patient with appointment date and time. Pathology results reported by Randa Lynn RN on 08/22/2023. Electronically Signed   By: Meda Klinefelter M.D.   On: 08/22/2023 17:02   Result Date: 08/22/2023 CLINICAL DATA:  Suspicious palpable LEFT breast mass and adjacent low axillary node EXAM: ULTRASOUND GUIDED LEFT BREAST CORE NEEDLE BIOPSY x2 COMPARISON:  Previous exam(s). PROCEDURE: I met with the patient and we discussed the procedure of ultrasound-guided biopsy, including benefits and alternatives. We discussed the high likelihood of a successful procedure. We discussed the risks of the procedure, including infection, bleeding, tissue injury, clip migration, and inadequate sampling. Informed written consent was given. The usual time-out protocol was performed immediately prior to the procedure. Targeted ultrasound performed of the LEFT axilla. There are 2 mildly enlarged LEFT axillary lymph nodes with cortical thickening of approximately 5 mm. The inferior-most corresponds to the node described on prior diagnostic workup from October 2024. Site 1: LEFT upper outer breast 1:30 9 cm from the nipple palpable mass Lesion quadrant: Upper outer quadrant Using sterile technique and 1% lidocaine and 1% lidocaine with epinephrine as local anesthetic, under direct ultrasound visualization, a 14 gauge spring-loaded device was used to perform biopsy of a mass at 1:30 9 cm from the nipple using a lateral approach. At the conclusion of the procedure a COIL shaped tissue marker clip was deployed into the biopsy cavity. Placement was verified sonographically. Follow up 2 view mammogram was performed and dictated separately. Site 2:  LEFT upper outer breast 1:30 10 cm from the nipple, adjacent low axillary node. Lesion quadrant: Upper outer quadrant Using sterile technique and 1% lidocaine and 1% lidocaine with epinephrine as local anesthetic, under direct ultrasound visualization, a 14 gauge spring-loaded device was used to perform biopsy of a mass at 1:30 10 cm from the nipple using a lateral approach. At the conclusion of the procedure a venus shaped tissue marker clip was deployed into the biopsy cavity. Placement was verified sonographically. Follow up 2 view mammogram was performed and dictated separately. IMPRESSION: 1. Ultrasound guided biopsy of a LEFT breast mass and adjacent low axillary lymph node. No apparent complications. 2. There are 2 mildly enlarged level 1 LEFT axillary lymph nodes, inclusive of the low axillary lymph node. Electronically Signed: By: Meda Klinefelter M.D. On: 08/21/2023 14:29   MM CLIP PLACEMENT LEFT Result Date: 08/21/2023 CLINICAL DATA:  Status post ultrasound-guided biopsy EXAM: 3D DIAGNOSTIC LEFT MAMMOGRAM POST ULTRASOUND BIOPSY COMPARISON:  Previous exam(s). FINDINGS: 3D Mammographic images were obtained following ultrasound guided biopsy of a LEFT breast mass and adjacent low axillary node. Both biopsy clips are outside of the field of view. IMPRESSION: Biopsy clips are outside of the mammographic field-of-view. Final Assessment: Post Procedure Mammograms for Marker Placement Electronically Signed   By: Meda Klinefelter M.D.  On: 08/21/2023 14:06     Assessment and plan- Patient is a 83 y.o. female with history of right breast cancer s/p mastectomy 30 years ago now with biopsy-proven left breast cancer T2 N1 MX ER/PR positive HER2 negative here to discuss further management  Discussed the results of left breast biopsy which was consistent with invasive mammary carcinoma grade 2 with mixed invasive ductal and lobular features.  Tumor was ER 95% positive PR 100% positive and HER2 negative  which is a biologically favorable subtype of breast cancer.  Adjacent intramammary lymph node was also positive for malignancy.  I would like to get a PET CT scan for staging workup.  If there is no evidence of metastatic disease I will refer her to surgery to discuss upfront surgery.  Given the extent of the mass patient may need a mastectomy.  Given patient's age and frailty she would not be a candidate for any adjuvant chemotherapy and patient does not want that either.  I will therefore not be sending any Oncotype testing postsurgery if she does not have metastatic disease.  She may need adjuvant radiation as well down the line but we will have the discussion later.  For now given that she has ER positive disease I will start her on tamoxifen until we have further clarity on her PET scan and surgery.  Discussed risks and benefits of both tamoxifen and aromatase inhibitors.  Risks of tamoxifen including all but not limited to increased risk of blood clots cataracts and uterine cancer.  Risks and benefits of AI including all but not limited to worsening bone health arthralgias hot flashes and mood swings.  Patient also has baseline osteoporosis which will need to be factored in if we have to start her on an AI down the line   Visit Diagnosis 1. Malignant neoplasm of upper-outer quadrant of left breast in female, estrogen receptor positive (HCC)   2. Goals of care, counseling/discussion      Dr. Owens Shark, MD, MPH [facility] [phone] 08/30/2023 12:54 PM

## 2023-09-02 ENCOUNTER — Ambulatory Visit
Admission: RE | Admit: 2023-09-02 | Discharge: 2023-09-02 | Disposition: A | Discharge status: Home / Self Care | Payer: Medicare HMO | Source: Ambulatory Visit | Attending: Oncology | Admitting: Oncology

## 2023-09-02 DIAGNOSIS — C50412 Malignant neoplasm of upper-outer quadrant of left female breast: Secondary | ICD-10-CM | POA: Diagnosis not present

## 2023-09-02 DIAGNOSIS — K118 Other diseases of salivary glands: Secondary | ICD-10-CM | POA: Insufficient documentation

## 2023-09-02 DIAGNOSIS — I700 Atherosclerosis of aorta: Secondary | ICD-10-CM | POA: Diagnosis not present

## 2023-09-02 DIAGNOSIS — K7460 Unspecified cirrhosis of liver: Secondary | ICD-10-CM | POA: Insufficient documentation

## 2023-09-02 DIAGNOSIS — R234 Changes in skin texture: Secondary | ICD-10-CM | POA: Insufficient documentation

## 2023-09-02 DIAGNOSIS — Z9011 Acquired absence of right breast and nipple: Secondary | ICD-10-CM | POA: Diagnosis not present

## 2023-09-02 DIAGNOSIS — Z170 Estrogen receptor positive status [ER+]: Secondary | ICD-10-CM | POA: Insufficient documentation

## 2023-09-02 DIAGNOSIS — R188 Other ascites: Secondary | ICD-10-CM | POA: Insufficient documentation

## 2023-09-02 DIAGNOSIS — R918 Other nonspecific abnormal finding of lung field: Secondary | ICD-10-CM | POA: Insufficient documentation

## 2023-09-02 LAB — GLUCOSE, CAPILLARY: Glucose-Capillary: 125 mg/dL — ABNORMAL HIGH (ref 70–99)

## 2023-09-02 MED ORDER — FLUDEOXYGLUCOSE F - 18 (FDG) INJECTION
8.1500 | Freq: Once | INTRAVENOUS | Status: AC | PRN
  Administered 2023-09-02: 8.15 via INTRAVENOUS

## 2023-09-03 ENCOUNTER — Ambulatory Visit: Payer: Medicare HMO | Admitting: Oncology

## 2023-09-04 ENCOUNTER — Inpatient Hospital Stay (HOSPITAL_BASED_OUTPATIENT_CLINIC_OR_DEPARTMENT_OTHER): Payer: Medicare HMO | Admitting: Oncology

## 2023-09-04 ENCOUNTER — Telehealth: Payer: Self-pay

## 2023-09-04 ENCOUNTER — Ambulatory Visit
Admission: RE | Admit: 2023-09-04 | Discharge: 2023-09-04 | Disposition: A | Discharge status: Home / Self Care | Payer: Medicare HMO | Source: Ambulatory Visit | Attending: Oncology | Admitting: Oncology

## 2023-09-04 ENCOUNTER — Encounter: Payer: Self-pay | Admitting: Oncology

## 2023-09-04 ENCOUNTER — Telehealth: Payer: Self-pay | Admitting: *Deleted

## 2023-09-04 VITALS — BP 123/68 | HR 93 | Temp 97.8°F | Resp 18

## 2023-09-04 DIAGNOSIS — E1122 Type 2 diabetes mellitus with diabetic chronic kidney disease: Secondary | ICD-10-CM | POA: Diagnosis not present

## 2023-09-04 DIAGNOSIS — Z79810 Long term (current) use of selective estrogen receptor modulators (SERMs): Secondary | ICD-10-CM | POA: Diagnosis not present

## 2023-09-04 DIAGNOSIS — C50412 Malignant neoplasm of upper-outer quadrant of left female breast: Secondary | ICD-10-CM

## 2023-09-04 DIAGNOSIS — Z1721 Progesterone receptor positive status: Secondary | ICD-10-CM | POA: Diagnosis not present

## 2023-09-04 DIAGNOSIS — Z170 Estrogen receptor positive status [ER+]: Secondary | ICD-10-CM | POA: Diagnosis not present

## 2023-09-04 DIAGNOSIS — Z7984 Long term (current) use of oral hypoglycemic drugs: Secondary | ICD-10-CM | POA: Diagnosis not present

## 2023-09-04 DIAGNOSIS — D500 Iron deficiency anemia secondary to blood loss (chronic): Secondary | ICD-10-CM | POA: Diagnosis not present

## 2023-09-04 DIAGNOSIS — M7989 Other specified soft tissue disorders: Secondary | ICD-10-CM | POA: Diagnosis not present

## 2023-09-04 DIAGNOSIS — R600 Localized edema: Secondary | ICD-10-CM | POA: Diagnosis not present

## 2023-09-04 DIAGNOSIS — K2940 Chronic atrophic gastritis without bleeding: Secondary | ICD-10-CM | POA: Diagnosis not present

## 2023-09-04 DIAGNOSIS — Z7189 Other specified counseling: Secondary | ICD-10-CM | POA: Diagnosis not present

## 2023-09-04 DIAGNOSIS — Z1732 Human epidermal growth factor receptor 2 negative status: Secondary | ICD-10-CM | POA: Diagnosis not present

## 2023-09-04 NOTE — Telephone Encounter (Signed)
Informed son.

## 2023-09-04 NOTE — Telephone Encounter (Signed)
-----   Message from [name] sent at 09/04/2023  3:40 PM EST ----- [name] Mention- Please let her know there is no dvt in her left arm. Please refer her to [facility] for management of lymphedema  Emily Faulkner- she developed LUE swelling after her breast biopsy. She is meeting with surgeon next week and has not had breast ca surgery yet. FYI

## 2023-09-04 NOTE — Telephone Encounter (Signed)
 Middle son called and said that the are talking about some people and looking up the surgeons and that want to refer to [place] and try to get appt next week if  possible.

## 2023-09-05 NOTE — Telephone Encounter (Signed)
 So they dont want to see dr pabon? If that's the case we will need to call dr pabons office- let them know about the situationa nd refer patient to Dr. Maia Plan

## 2023-09-06 ENCOUNTER — Ambulatory Visit: Payer: Medicare HMO | Admitting: Orthopedic Surgery

## 2023-09-06 NOTE — Progress Notes (Signed)
 Hematology/Oncology Consult note [facility]  Telephone:([phone] [phone]  Patient Care Team: Marina Goodell, MD as PCP - General (Family Medicine) [name], MD as Consulting Physician (Oncology)   Name of the patient: Emily Faulkner  [id number]  October 13, 1940   Date of visit: 09/06/23  Diagnosis-  1.  Iron deficiency anemia due to bleeding AVM 2.  Invasive mammary carcinoma of the left breast cT2 N1 MX ER/PR positive HER2 negative      Chief complaint/ Reason for visit- discuss pet scan results and further management  Heme/Onc history: Patient is a 83 year old female with a past medical history significant for stage III CKD, hypertension diabetes and left renal mass who was found to have a hemoglobin of 8.7 at nephrology office and hence referred the patient for anemia.  For a renal mass she has been followed by Dr. Apolinar Junes in the masses thought to be slow-growing and conservative management was recommended.  Patient presently reports ongoing fatigue.  Denies any blood loss in her stool or urine.  Denies any dark melanotic stools.  Patient has had a complete GI work-up including endoscopy colonoscopy and small bowel endoscopy in 2019 by Dr. Tobi Bastos colonoscopy showed multiple bleeding colonic angioectasias treated with APC small bowel endoscopy showed nonbleeding angioectasias in the jejunum treated with APC   Patient found to have significant iron deficiency anemia with a hemoglobin of 6.9 requiring IV iron.  Also seen by Dr. Allegra Lai and underwent push enteroscopy which did not show any evidence of active bleeding but showed chronic atrophic gastritis   Given persistent iron deficiency anemia due to bleeding AVMs patient was also started on monthly octreotide.  Currently octreotide is on hold   Patient was incidentally noted to have left breast mass on CT scans in September 2024 this was followed by diagnostic left breast mammogram which showed a  3.2 cm mass in the left breast.  Along the lateral margins of the mass there was an enlarged intramammary lymph node.  Left axilla could not be well assessed due to patient positioning.  She had biopsy of the breast mass as well as the lymph node which showed grade 2 invasive mammary carcinoma with mixed ductal and lobular features ER 95% positive PR 100% positive HER2 negative Ki-67 15%.  Patient has a prior history of a right breast cancer over 30 years ago s/p right mastectomy and she took tamoxifen at that time for 5 years  PET scan showed hypermetabolic left breast mass consistent with known malignancy.  Soft tissue thickening in the left subpectoral region suspicious for lymph node involvement.  No evidence of distant metastatic disease.    Interval history-patient reports swelling in her left upper extremity from the time she has had breast biopsy.  ECOG PS- 2 Pain scale- 0   Review of systems- Review of Systems  Constitutional:  Positive for malaise/fatigue. Negative for chills, fever and weight loss.  HENT:  Negative for congestion, ear discharge and nosebleeds.   Eyes:  Negative for blurred vision.  Respiratory:  Negative for cough, hemoptysis, sputum production, shortness of breath and wheezing.   Cardiovascular:  Negative for chest pain, palpitations, orthopnea and claudication.  Gastrointestinal:  Negative for abdominal pain, blood in stool, constipation, diarrhea, heartburn, melena, nausea and vomiting.  Genitourinary:  Negative for dysuria, flank pain, frequency, hematuria and urgency.  Musculoskeletal:  Negative for back pain, joint pain and myalgias.  Skin:  Negative for rash.  Neurological:  Negative for  dizziness, tingling, focal weakness, seizures, weakness and headaches.  Endo/Heme/Allergies:  Does not bruise/bleed easily.  Psychiatric/Behavioral:  Negative for depression and suicidal ideas. The patient does not have insomnia.       No Known Allergies   Past Medical  History:  Diagnosis Date   Acute kidney failure (HCC) 11/16/2020   AKI (acute kidney injury) (HCC) 03/17/2018   Anemia    Cancer (HCC)    breast cancer right mastectomy   Diabetes mellitus without complication (HCC)    GIB (gastrointestinal bleeding) 02/20/2018   Glaucoma    Hyperkalemia 10/24/2020   Hyperlipidemia    Hypertension    Incontinence of bowel    Neuropathy    Odynophagia 10/24/2020   Osteoarthritis    Presence of permanent cardiac pacemaker    Rheumatoid arthritis (HCC)    Syncope and collapse 10/24/2020   Telangiectasias      Past Surgical History:  Procedure Laterality Date   BREAST BIOPSY Left 08/21/2023   [place] LT BREAST BX W LOC DEV 1ST LESION IMG BX SPEC US GUIDE 08/21/2023 [facility]   BREAST BIOPSY Left 08/21/2023   [place] LT BREAST BX W LOC DEV EA ADD LESION IMG BX SPEC US GUIDE 08/21/2023 [facility]   BREAST SURGERY     right mastectomy   CHOLECYSTECTOMY     COLONOSCOPY     COLONOSCOPY WITH PROPOFOL N/A 08/14/2016   Procedure: COLONOSCOPY WITH PROPOFOL;  Surgeon: Christena Deem, MD;  Location: [facility] ENDOSCOPY;  Service: Endoscopy;  Laterality: N/A;   COLONOSCOPY WITH PROPOFOL N/A 02/22/2018   Procedure: COLONOSCOPY WITH PROPOFOL;  Surgeon: Wyline Mood, MD;  Location: [facility] ENDOSCOPY;  Service: Gastroenterology;  Laterality: N/A;   ENTEROSCOPY N/A 04/12/2021   Procedure: ENTEROSCOPY;  Surgeon: Toney Reil, MD;  Location: [facility] ENDOSCOPY;  Service: Gastroenterology;  Laterality: N/A;  push   ESOPHAGOGASTRODUODENOSCOPY (EGD) WITH PROPOFOL N/A 08/14/2016   Procedure: ESOPHAGOGASTRODUODENOSCOPY (EGD) WITH PROPOFOL;  Surgeon: Christena Deem, MD;  Location: [facility] ENDOSCOPY;  Service: Endoscopy;  Laterality: N/A;   ESOPHAGOGASTRODUODENOSCOPY (EGD) WITH PROPOFOL N/A 01/30/2018   Procedure: ESOPHAGOGASTRODUODENOSCOPY (EGD) WITH PROPOFOL;  Surgeon: Toledo, Boykin Nearing, MD;  Location: [facility] ENDOSCOPY;  Service: Gastroenterology;  Laterality: N/A;    ESOPHAGOGASTRODUODENOSCOPY (EGD) WITH PROPOFOL N/A 02/22/2018   Procedure: ESOPHAGOGASTRODUODENOSCOPY (EGD) WITH PROPOFOL;  Surgeon: Wyline Mood, MD;  Location: [facility] ENDOSCOPY;  Service: Gastroenterology;  Laterality: N/A;   EXCISIONAL HEMORRHOIDECTOMY     IR KYPHO THORACIC WITH BONE BIOPSY  05/14/2023   IR RADIOLOGIST EVAL & MGMT  08/07/2022   IR RADIOLOGIST EVAL & MGMT  09/12/2022   IR RADIOLOGIST EVAL & MGMT  05/02/2023   IR RADIOLOGIST EVAL & MGMT  05/02/2023   IR RADIOLOGIST EVAL & MGMT  05/23/2023   KYPHOPLASTY N/A 03/20/2018   Procedure: [id number];  Surgeon: Kennedy Bucker, MD;  Location: [facility] ORS;  Service: Orthopedics;  Laterality: N/A;   PACEMAKER IMPLANT N/A 10/26/2020   Procedure: PACEMAKER IMPLANT;  Surgeon: Marcina Millard, MD;  Location: [facility] INVASIVE CV LAB;  Service: Cardiovascular;  Laterality: N/A;   RADIOLOGY WITH ANESTHESIA N/A 08/31/2022   Procedure: CT renal microwave ablation;  Surgeon: Pernell Dupre, MD;  Location: [facility] OR;  Service: Radiology;  Laterality: N/A;    Social History   Socioeconomic History   Marital status: Widowed    Spouse name: Not on file   Number of children: Not on file   Years of education: Not on file   Highest education level: Not on file  Occupational History  Not on file  Tobacco Use   Smoking status: Former    Current packs/day: 0.00    Types: Cigarettes    Quit date: 07/10/2011    Years since quitting: 12.1   Smokeless tobacco: Never  Vaping Use   Vaping status: Never Used  Substance and Sexual Activity   Alcohol use: No   Drug use: No   Sexual activity: Not Currently  Other Topics Concern   Not on file  Social History Narrative   Not on file   Social Drivers of Health   Financial Resource Strain: Low Risk  (03/27/2023)   Received from [facility]   Overall Financial Resource Strain (CARDIA)    Difficulty of Paying Living Expenses: Not hard at all  Food Insecurity: No Food Insecurity (05/28/2023)    Hunger Vital Sign    Worried About Running Out of Food in the Last Year: Never true    Ran Out of Food in the Last Year: Never true  Transportation Needs: No Transportation Needs (05/28/2023)   PRAPARE - Administrator, Civil Service (Medical): No    Lack of Transportation (Non-Medical): No  Physical Activity: Not on file  Stress: Not on file  Social Connections: Not on file  Intimate Partner Violence: Not At Risk (05/28/2023)   Humiliation, Afraid, Rape, and Kick questionnaire    Fear of Current or Ex-Partner: No    Emotionally Abused: No    Physically Abused: No    Sexually Abused: No    Family History  Problem Relation Age of Onset   Kidney cancer Son    Bladder Cancer Neg Hx    Breast cancer Neg Hx      Current Outpatient Medications:    ACCU-CHEK AVIVA PLUS test strip, , Disp: , Rfl:    ACCU-CHEK SOFTCLIX LANCETS lancets, once daily Use as instructed., Disp: , Rfl:    alendronate (FOSAMAX) 70 MG tablet, TAKE 1 TABLET BY MOUTH EVERY 7DAYS WITH A FULL GLASS OF WATER. DO NOT LIE DOWN FOR 30 MINUTES, Disp: , Rfl:    Biotin 1000 MCG tablet, Take 1,000 mcg by mouth daily., Disp: , Rfl:    Calcium Carb-Cholecalciferol (OYSTER SHELL CALCIUM W/D) 500-5 MG-MCG TABS, Take 1 tablet by mouth daily., Disp: , Rfl:    Cholecalciferol 50 MCG (2000 UT) CAPS, Take 2,000 Units by mouth daily., Disp: , Rfl:    feeding supplement, ENSURE ENLIVE, (ENSURE ENLIVE) LIQD, Take 237 mLs by mouth 2 (two) times daily between meals., Disp: 237 mL, Rfl: 12   HYDROcodone-acetaminophen (NORCO) 5-325 MG tablet, Take 1 tablet by mouth every 8 (eight) hours as needed for severe pain (pain score 7-10)., Disp: 10 tablet, Rfl: 0   insulin glargine (LANTUS) 100 UNIT/ML Solostar Pen, Inject 15 Units into the skin daily. (may take up to 50u based upon blood glucose reading) (Patient taking differently: Inject 28 Units into the skin daily. (may take up to 50u based upon blood glucose reading)), Disp: 15 mL,  Rfl: 11   Insulin Pen Needle 32G X 4 MM MISC, USE 1 NEEDLE SUBCUTANEOUSLY ONCE A DAY AS DIRECTED, Disp: , Rfl:    losartan (COZAAR) 50 MG tablet, Take 25 mg by mouth daily., Disp: , Rfl:    niacin 500 MG CR capsule, Take 500 mg by mouth at bedtime., Disp: , Rfl:    omeprazole (PRILOSEC) 40 MG capsule, TAKE 1 CAPSULE BY MOUTH EVERY DAY BEFORE BREAKFAST, Disp: 30 capsule, Rfl: 0   Probiotic Product (  PROBIOTIC PO), Take 1 tablet by mouth daily. 2 billion, Disp: , Rfl:    simvastatin (ZOCOR) 20 MG tablet, Take 20 mg by mouth at bedtime., Disp: , Rfl: 6   sitaGLIPtin (JANUVIA) 50 MG tablet, Take 50 mg by mouth daily., Disp: , Rfl:    tamoxifen (NOLVADEX) 20 MG tablet, Take 1 tablet (20 mg total) by mouth daily., Disp: 30 tablet, Rfl: 3   TRAVATAN Z 0.004 % SOLN ophthalmic solution, Place 1 drop into both eyes at bedtime., Disp: , Rfl:    TURMERIC PO, Take 538 mg by mouth daily., Disp: , Rfl:    vitamin B-12 (CYANOCOBALAMIN) 1000 MCG tablet, Take 2,000 mcg by mouth daily., Disp: , Rfl:   Physical exam:  Vitals:   09/04/23 1351  BP: 123/68  Pulse: 93  Resp: 18  Temp: 97.8 F (36.6 C)  TempSrc: Tympanic  SpO2: 100%   Physical Exam Constitutional:      Comments: Elderly frail woman sitting in a wheelchair. Appears in no acute distress  Cardiovascular:     Rate and Rhythm: Normal rate and regular rhythm.     Heart sounds: Normal heart sounds.  Pulmonary:     Effort: Pulmonary effort is normal.     Breath sounds: Normal breath sounds.  Musculoskeletal:     Comments: Left Upper extremity more swollen than the right.  Skin:    General: Skin is warm and dry.  Neurological:     Mental Status: She is alert and oriented to person, place, and time.         Latest Ref Rng & Units 06/17/2023    1:58 PM  CMP  Glucose 70 - 99 mg/dL 161   BUN 8 - 27 mg/dL 8   Creatinine 0.96 - 0.45 mg/dL 4.09   Sodium 811 - 914 mmol/L 143   Potassium 3.5 - 5.2 mmol/L 3.6   Chloride 96 - 106 mmol/L 108    CO2 20 - 29 mmol/L 20   Calcium 8.7 - 10.3 mg/dL 9.9   Total Protein 6.0 - 8.5 g/dL 6.6   Total Bilirubin 0.0 - 1.2 mg/dL 1.1   Alkaline Phos 44 - 121 IU/L 103   AST 0 - 40 IU/L 33   ALT 0 - 32 IU/L 16       Latest Ref Rng & Units 08/09/2023    1:55 PM  CBC  WBC 4.0 - 10.5 K/uL 4.6   Hemoglobin 12.0 - 15.0 g/dL 78.2   Hematocrit 95.6 - 46.0 % 34.9   Platelets 150 - 400 K/uL 104     No images are attached to the encounter.  US Venous Img Upper Uni Left Result Date: 09/04/2023 CLINICAL DATA:  83 year old female with history of left lower extremity swelling, history of breast cancer. EXAM: LEFT UPPER EXTREMITY VENOUS DOPPLER ULTRASOUND TECHNIQUE: Gray-scale sonography with graded compression, as well as color Doppler and duplex ultrasound were performed to evaluate the upper extremity deep venous system from the level of the subclavian vein and including the jugular, axillary, basilic, radial, ulnar and upper cephalic vein. Spectral Doppler was utilized to evaluate flow at rest and with distal augmentation maneuvers. COMPARISON:  None Available. FINDINGS: Contralateral Subclavian Vein: Respiratory phasicity is normal and symmetric with the symptomatic side. No evidence of thrombus. Normal compressibility. Internal Jugular Vein: No evidence of thrombus. Normal compressibility, respiratory phasicity and response to augmentation. Subclavian Vein: No evidence of thrombus. Normal compressibility, respiratory phasicity and response to augmentation. Axillary Vein: No evidence of  thrombus. Normal compressibility, respiratory phasicity and response to augmentation. Cephalic Vein: No evidence of thrombus. Normal compressibility, respiratory phasicity and response to augmentation. Basilic Vein: No evidence of thrombus. Normal compressibility, respiratory phasicity and response to augmentation. Brachial Veins: No evidence of thrombus. Normal compressibility, respiratory phasicity and response to augmentation.  Radial Veins: No evidence of thrombus. Normal compressibility, respiratory phasicity and response to augmentation. Ulnar Veins: No evidence of thrombus. Normal compressibility, respiratory phasicity and response to augmentation. Other Findings:  Mild subcutaneous edema noted in the forearm. IMPRESSION: No evidence of left upper extremity deep or superficial venous thrombosis. Marliss Coots, MD [facility] Radiology Electronically Signed   By: Marliss Coots M.D.   On: 09/04/2023 15:36   NM PET Image Restag (PS) Skull Base To Thigh Result Date: 09/02/2023 CLINICAL DATA:  Initial treatment strategy for recently diagnosed left breast cancer. Remote right breast cancer with mastectomy. EXAM: NUCLEAR MEDICINE PET SKULL BASE TO THIGH TECHNIQUE: 8.15 mCi F-18 FDG was injected intravenously. Full-ring PET imaging was performed from the skull base to thigh after the radiotracer. CT data was obtained and used for attenuation correction and anatomic localization. Fasting blood glucose: 125 mg/dl COMPARISON:  Recent breast imaging. CT of the chest, abdomen and pelvis 04/08/2023 FINDINGS: Mediastinal blood pool activity: SUV max 2.1 NECK: No hypermetabolic cervical lymph nodes are identified.There is a small hypermetabolic nodule superficially in the left parotid gland (SUV max 4.2), likely an incidental parotid lesion. No suspicious activity identified within the pharyngeal mucosal space. Incidental CT findings: Bilateral carotid atherosclerosis. CHEST: Previously demonstrated mass in the lower outer quadrant of the left breast demonstrates heterogeneous intermediate metabolic activity (SUV max 5.1). This mass measures approximately 4.0 x 3.3 cm on image 58/6. There is increased soft tissue thickening in the left subpectoral region with associated hypermetabolic activity (SUV max 6.0), suspicious for nodal involvement. No hypermetabolic mediastinal, hilar or internal mammary  lymph nodes demonstrated. No highly suspicious hypermetabolic activity or pulmonary nodularity identified. There is chronic lung disease with increased subpleural opacities in both lower lobes with associated low level metabolic activity. On the right, there is a 1.1 cm nodular opacity on image 46/6 which has an SUV max 2.0. Incidental CT findings: Atherosclerosis of the aorta, great vessels and coronary arteries. Left subclavian pacemaker leads in place. Previous right mastectomy and right axillary node dissection. Increased edema throughout the left breast which may be treatment related. ABDOMEN/PELVIS: There is no hypermetabolic activity within the liver, adrenal glands, spleen or pancreas. There is no hypermetabolic nodal activity in the abdomen or pelvis. Incidental CT findings: Morphologic changes of cirrhosis with new moderate volume of ascites throughout the peritoneal cavity. Diffuse aortic and branch vessel atherosclerosis. Diverticular changes within the distal colon without definite acute inflammatory changes. SKELETON: There is no hypermetabolic activity to suggest osseous metastatic disease. Incidental CT findings: Previous multilevel spinal augmentation at T10, T12 and L4. Multilevel spondylosis. IMPRESSION: 1. Hypermetabolic left breast mass consistent with known primary malignancy. 2. Hypermetabolic soft tissue thickening in the left subpectoral region suspicious for nodal involvement. 3. No evidence of distant metastatic disease. 4. Increased subpleural opacities in both lower lobes with associated low level metabolic activity, likely infectious/inflammatory. Recommend attention on follow-up. 5. Morphologic changes of cirrhosis with new moderate volume of ascites throughout the peritoneal cavity. 6. Small hypermetabolic nodule superficially in the left parotid gland, likely an incidental parotid lesion. 7. Aortic atherosclerosis. Electronically Signed   By: Carey Bullocks M.D.   On: 09/02/2023  15:45   [place] LT BREAST BX W LOC DEV 1ST LESION IMG BX SPEC US GUIDE Addendum Date: 08/22/2023 ADDENDUM REPORT: 08/22/2023 17:02 ADDENDUM: PATHOLOGY revealed: 1. Breast, left, needle core biopsy, 1:30 9 cmfn : INVASIVE DUCTAL CARCINOMA. OVERALL GRADE: 2. LYMPHOVASCULAR INVASION: IDENTIFIED. PERINEURAL INVASION: IDENTIFIED. CANCER LENGTH: 1.7 CM. CALCIFICATIONS: NOT IDENTIFIED. 2. Breast, left, needle core biopsy, 1:30 o'clock; 10 cmfn : ONE LYMPH NODE, POSITIVE FOR METASTATIC CARCINOMA (0/1). Pathology results are CONCORDANT with imaging findings, per Dr. Meda Klinefelter. Pathology results and recommendations were discussed with patient via telephone on 08/22/2023 by Randa Lynn RN. Patient reported biopsy site doing well with no adverse symptoms, and only slight tenderness at the site. Post biopsy care instructions were reviewed, questions were answered and my direct phone number was provided. Patient was instructed to call [facility] for any additional questions or concerns related to biopsy site. RECOMMENDATIONS: 1. Surgical and oncological consultation. Per patient request, appointment with provider (Dr. Owens Shark) was requested by Randa Lynn RN on 08/22/2023 to discuss treatment plan. Patient stated she did not think she could tolerate surgery, but wanted to talk it over with her sons and her oncologist, Dr. Owens Shark, before making any decisions. Patient requested provider to make the surgical consultation referral if patient decides to pursue surgery. Randa Lynn RN sent Epic in-basket to provider on 08/22/2023 with request for patient appointment and potential surgical request. Provider office was also notified to contact patient with appointment date and time. Pathology results reported by Randa Lynn RN on 08/22/2023. Electronically Signed   By: Meda Klinefelter M.D.   On: 08/22/2023 17:02   Result Date: 08/22/2023 CLINICAL DATA:  Suspicious palpable LEFT breast mass and adjacent low  axillary node EXAM: ULTRASOUND GUIDED LEFT BREAST CORE NEEDLE BIOPSY x2 COMPARISON:  Previous exam(s). PROCEDURE: I met with the patient and we discussed the procedure of ultrasound-guided biopsy, including benefits and alternatives. We discussed the high likelihood of a successful procedure. We discussed the risks of the procedure, including infection, bleeding, tissue injury, clip migration, and inadequate sampling. Informed written consent was given. The usual time-out protocol was performed immediately prior to the procedure. Targeted ultrasound performed of the LEFT axilla. There are 2 mildly enlarged LEFT axillary lymph nodes with cortical thickening of approximately 5 mm. The inferior-most corresponds to the node described on prior diagnostic workup from October 2024. Site 1: LEFT upper outer breast 1:30 9 cm from the nipple palpable mass Lesion quadrant: Upper outer quadrant Using sterile technique and 1% lidocaine and 1% lidocaine with epinephrine as local anesthetic, under direct ultrasound visualization, a 14 gauge spring-loaded device was used to perform biopsy of a mass at 1:30 9 cm from the nipple using a lateral approach. At the conclusion of the procedure a COIL shaped tissue marker clip was deployed into the biopsy cavity. Placement was verified sonographically. Follow up 2 view mammogram was performed and dictated separately. Site 2: LEFT upper outer breast 1:30 10 cm from the nipple, adjacent low axillary node. Lesion quadrant: Upper outer quadrant Using sterile technique and 1% lidocaine and 1% lidocaine with epinephrine as local anesthetic, under direct ultrasound visualization, a 14 gauge spring-loaded device was used to perform biopsy of a mass at 1:30 10 cm from the nipple using a lateral approach. At the conclusion of the procedure a venus shaped tissue marker clip was deployed into the biopsy cavity. Placement was verified sonographically. Follow up 2 view mammogram was performed and  dictated separately. IMPRESSION: 1. Ultrasound  guided biopsy of a LEFT breast mass and adjacent low axillary lymph node. No apparent complications. 2. There are 2 mildly enlarged level 1 LEFT axillary lymph nodes, inclusive of the low axillary lymph node. Electronically Signed: By: Meda Klinefelter M.D. On: 08/21/2023 14:29   [place] LT BREAST BX W LOC DEV EA ADD LESION IMG BX SPEC US GUIDE Addendum Date: 08/22/2023 ADDENDUM REPORT: 08/22/2023 17:02 ADDENDUM: PATHOLOGY revealed: 1. Breast, left, needle core biopsy, 1:30 9 cmfn : INVASIVE DUCTAL CARCINOMA. OVERALL GRADE: 2. LYMPHOVASCULAR INVASION: IDENTIFIED. PERINEURAL INVASION: IDENTIFIED. CANCER LENGTH: 1.7 CM. CALCIFICATIONS: NOT IDENTIFIED. 2. Breast, left, needle core biopsy, 1:30 o'clock; 10 cmfn : ONE LYMPH NODE, POSITIVE FOR METASTATIC CARCINOMA (0/1). Pathology results are CONCORDANT with imaging findings, per Dr. Meda Klinefelter. Pathology results and recommendations were discussed with patient via telephone on 08/22/2023 by Randa Lynn RN. Patient reported biopsy site doing well with no adverse symptoms, and only slight tenderness at the site. Post biopsy care instructions were reviewed, questions were answered and my direct phone number was provided. Patient was instructed to call [facility] for any additional questions or concerns related to biopsy site. RECOMMENDATIONS: 1. Surgical and oncological consultation. Per patient request, appointment with provider (Dr. Owens Shark) was requested by Randa Lynn RN on 08/22/2023 to discuss treatment plan. Patient stated she did not think she could tolerate surgery, but wanted to talk it over with her sons and her oncologist, Dr. Owens Shark, before making any decisions. Patient requested provider to make the surgical consultation referral if patient decides to pursue surgery. Randa Lynn RN sent Epic in-basket to provider on 08/22/2023 with request for patient appointment and potential surgical  request. Provider office was also notified to contact patient with appointment date and time. Pathology results reported by Randa Lynn RN on 08/22/2023. Electronically Signed   By: Meda Klinefelter M.D.   On: 08/22/2023 17:02   Result Date: 08/22/2023 CLINICAL DATA:  Suspicious palpable LEFT breast mass and adjacent low axillary node EXAM: ULTRASOUND GUIDED LEFT BREAST CORE NEEDLE BIOPSY x2 COMPARISON:  Previous exam(s). PROCEDURE: I met with the patient and we discussed the procedure of ultrasound-guided biopsy, including benefits and alternatives. We discussed the high likelihood of a successful procedure. We discussed the risks of the procedure, including infection, bleeding, tissue injury, clip migration, and inadequate sampling. Informed written consent was given. The usual time-out protocol was performed immediately prior to the procedure. Targeted ultrasound performed of the LEFT axilla. There are 2 mildly enlarged LEFT axillary lymph nodes with cortical thickening of approximately 5 mm. The inferior-most corresponds to the node described on prior diagnostic workup from October 2024. Site 1: LEFT upper outer breast 1:30 9 cm from the nipple palpable mass Lesion quadrant: Upper outer quadrant Using sterile technique and 1% lidocaine and 1% lidocaine with epinephrine as local anesthetic, under direct ultrasound visualization, a 14 gauge spring-loaded device was used to perform biopsy of a mass at 1:30 9 cm from the nipple using a lateral approach. At the conclusion of the procedure a COIL shaped tissue marker clip was deployed into the biopsy cavity. Placement was verified sonographically. Follow up 2 view mammogram was performed and dictated separately. Site 2: LEFT upper outer breast 1:30 10 cm from the nipple, adjacent low axillary node. Lesion quadrant: Upper outer quadrant Using sterile technique and 1% lidocaine and 1% lidocaine with epinephrine as local anesthetic, under direct ultrasound  visualization, a 14 gauge spring-loaded device was used to perform biopsy of a mass at  1:30 10 cm from the nipple using a lateral approach. At the conclusion of the procedure a venus shaped tissue marker clip was deployed into the biopsy cavity. Placement was verified sonographically. Follow up 2 view mammogram was performed and dictated separately. IMPRESSION: 1. Ultrasound guided biopsy of a LEFT breast mass and adjacent low axillary lymph node. No apparent complications. 2. There are 2 mildly enlarged level 1 LEFT axillary lymph nodes, inclusive of the low axillary lymph node. Electronically Signed: By: Meda Klinefelter M.D. On: 08/21/2023 14:29   MM CLIP PLACEMENT LEFT Result Date: 08/21/2023 CLINICAL DATA:  Status post ultrasound-guided biopsy EXAM: 3D DIAGNOSTIC LEFT MAMMOGRAM POST ULTRASOUND BIOPSY COMPARISON:  Previous exam(s). FINDINGS: 3D Mammographic images were obtained following ultrasound guided biopsy of a LEFT breast mass and adjacent low axillary node. Both biopsy clips are outside of the field of view. IMPRESSION: Biopsy clips are outside of the mammographic field-of-view. Final Assessment: Post Procedure Mammograms for Marker Placement Electronically Signed   By: Meda Klinefelter M.D.   On: 08/21/2023 14:06     Assessment and plan- Patient is a 83 y.o. 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  PET scan shows hypermetabolic left breast mass and hypermetabolic left axillary adenopathy but no evidence of distant metastatic disease.  Given that she has strongly ER/PR positive HER2 negative disease there would be no role for neoadjuvant chemotherapy that would meaningfully shrink this mass.  Moreover patient is elderly and frail and was not a candidate for  chemotherapy.  I did discuss her case with Dr. Everlene Farrier from surgery and he will be seeing her next week to discuss upfront surgery.  In the meanwhile I have started the patient on tamoxifen for antiestrogen therapy which she we will stop a week prior to surgery and can resume 2 weeks postsurgery.  I will see her after final pathology results are back and based on her preferences refer her to radiation oncology as well to consider adjuvant radiation therapy.  It remains to be seen if patient can go for lumpectomy versus if she will need a mastectomy.  I will see her back in 3 weeks to discuss pathology results and further management   Cancer Staging  Breast cancer [facility] Staging form: Breast, AJCC 8th Edition - Clinical stage from 09/04/2023: Stage IIA (cT2, cN1, cM0, G2, ER+, PR+, HER2-) - Signed by [name], MD on 09/06/2023 Stage prefix: Initial diagnosis Histologic grading system: 3 grade system     Visit Diagnosis 1. Malignant neoplasm of upper-outer quadrant of left breast in female, estrogen receptor positive (HCC)   2. Goals of care, counseling/discussion      Dr. Owens Shark, MD, MPH [facility] [phone] 09/06/2023 1:04 PM

## 2023-09-07 ENCOUNTER — Encounter: Payer: Self-pay | Admitting: Oncology

## 2023-09-09 ENCOUNTER — Ambulatory Visit: Payer: Self-pay | Admitting: Surgery

## 2023-09-11 ENCOUNTER — Ambulatory Visit: Payer: Medicare HMO | Attending: Oncology | Admitting: Occupational Therapy

## 2023-09-11 DIAGNOSIS — I890 Lymphedema, not elsewhere classified: Secondary | ICD-10-CM | POA: Insufficient documentation

## 2023-09-11 NOTE — Therapy (Signed)
 OUTPATIENT OCCUPATIONAL THERAPY BREAST CANCER BASELINE EVALUATION   Patient Name: Emily Faulkner MRN: [id number] DOB:August 25, 1940, 83 y.o., female Today's Date: 09/11/2023  END OF SESSION:  OT End of Session - 09/11/23 1828     Visit Number 1    Number of Visits 12    Date for OT Re-Evaluation 12/04/23    OT Start Time 1005    OT Stop Time 1040    OT Time Calculation (min) 35 min    Activity Tolerance Patient tolerated treatment well    Behavior During Therapy [facility] for tasks assessed/performed             Past Medical History:  Diagnosis Date   Acute kidney failure (HCC) 11/16/2020   AKI (acute kidney injury) (HCC) 03/17/2018   Anemia    Cancer (HCC)    breast cancer right mastectomy   Diabetes mellitus without complication (HCC)    GIB (gastrointestinal bleeding) 02/20/2018   Glaucoma    Hyperkalemia 10/24/2020   Hyperlipidemia    Hypertension    Incontinence of bowel    Neuropathy    Odynophagia 10/24/2020   Osteoarthritis    Presence of permanent cardiac pacemaker    Rheumatoid arthritis (HCC)    Syncope and collapse 10/24/2020   Telangiectasias    Past Surgical History:  Procedure Laterality Date   BREAST BIOPSY Left 08/21/2023   [place] LT BREAST BX W LOC DEV 1ST LESION IMG BX SPEC US GUIDE 08/21/2023 [facility]   BREAST BIOPSY Left 08/21/2023   [place] LT BREAST BX W LOC DEV EA ADD LESION IMG BX SPEC US GUIDE 08/21/2023 [facility]   BREAST SURGERY     right mastectomy   CHOLECYSTECTOMY     COLONOSCOPY     COLONOSCOPY WITH PROPOFOL N/A 08/14/2016   Procedure: COLONOSCOPY WITH PROPOFOL;  Surgeon: Christena Deem, MD;  Location: [facility] ENDOSCOPY;  Service: Endoscopy;  Laterality: N/A;   COLONOSCOPY WITH PROPOFOL N/A 02/22/2018   Procedure: COLONOSCOPY WITH PROPOFOL;  Surgeon: Wyline Mood, MD;  Location: [facility] ENDOSCOPY;  Service: Gastroenterology;  Laterality: N/A;   ENTEROSCOPY N/A 04/12/2021   Procedure: ENTEROSCOPY;  Surgeon: Toney Reil, MD;   Location: [facility] ENDOSCOPY;  Service: Gastroenterology;  Laterality: N/A;  push   ESOPHAGOGASTRODUODENOSCOPY (EGD) WITH PROPOFOL N/A 08/14/2016   Procedure: ESOPHAGOGASTRODUODENOSCOPY (EGD) WITH PROPOFOL;  Surgeon: Christena Deem, MD;  Location: [facility] ENDOSCOPY;  Service: Endoscopy;  Laterality: N/A;   ESOPHAGOGASTRODUODENOSCOPY (EGD) WITH PROPOFOL N/A 01/30/2018   Procedure: ESOPHAGOGASTRODUODENOSCOPY (EGD) WITH PROPOFOL;  Surgeon: Toledo, Boykin Nearing, MD;  Location: [facility] ENDOSCOPY;  Service: Gastroenterology;  Laterality: N/A;   ESOPHAGOGASTRODUODENOSCOPY (EGD) WITH PROPOFOL N/A 02/22/2018   Procedure: ESOPHAGOGASTRODUODENOSCOPY (EGD) WITH PROPOFOL;  Surgeon: Wyline Mood, MD;  Location: [facility] ENDOSCOPY;  Service: Gastroenterology;  Laterality: N/A;   EXCISIONAL HEMORRHOIDECTOMY     IR KYPHO THORACIC WITH BONE BIOPSY  05/14/2023   IR RADIOLOGIST EVAL & MGMT  08/07/2022   IR RADIOLOGIST EVAL & MGMT  09/12/2022   IR RADIOLOGIST EVAL & MGMT  05/02/2023   IR RADIOLOGIST EVAL & MGMT  05/02/2023   IR RADIOLOGIST EVAL & MGMT  05/23/2023   KYPHOPLASTY N/A 03/20/2018   Procedure: [id number];  Surgeon: Kennedy Bucker, MD;  Location: [facility] ORS;  Service: Orthopedics;  Laterality: N/A;   PACEMAKER IMPLANT N/A 10/26/2020   Procedure: PACEMAKER IMPLANT;  Surgeon: Marcina Millard, MD;  Location: [facility] INVASIVE CV LAB;  Service: Cardiovascular;  Laterality: N/A;   RADIOLOGY WITH ANESTHESIA N/A 08/31/2022   Procedure: CT renal microwave  ablation;  Surgeon: Pernell Dupre, MD;  Location: [facility] OR;  Service: Radiology;  Laterality: N/A;   Patient Active Problem List   Diagnosis Date Noted   Thoracic facet joint syndrome 08/06/2023   Parotiditis 05/28/2023   Hyperbilirubinemia 05/28/2023   Thrombocytopenia (HCC) 05/28/2023   Mass of lower outer quadrant of left breast 04/10/2023   Closed wedge compression fracture of T10 vertebra (HCC) 04/09/2023   Intractable back pain 04/08/2023   Breast cancer (HCC) 04/08/2023    Renal mass, left 08/31/2022   Renal cancer (HCC) 08/31/2022   Acquired arteriovenous malformation of small intestine    Coronary artery disease involving native coronary artery of native heart 11/10/2020   Symptomatic bradycardia 11/10/2020   Stage 3b chronic kidney disease (HCC) 11/10/2020   Closed fracture of distal end of right fibula 10/28/2020   Complete heart block (HCC) 10/24/2020   Type 2 diabetes mellitus with stage 3 chronic kidney disease, with long-term current use of insulin (HCC) 09/16/2018   Age-related osteoporosis without current pathological fracture 05/09/2018   Renal mass 03/17/2018   Microalbuminuric diabetic nephropathy (HCC) 11/02/2016   Pulmonary hypertension (HCC) 09/11/2016   Hypertension 09/06/2016   High cholesterol 09/06/2016   Iron deficiency anemia 04/15/2016   Type 2 diabetes mellitus (HCC) 08/04/2014   Hyperlipidemia, unspecified 01/22/2014   Personal history of breast cancer 07/17/2012    PCP: Dr Laurina Bustle  REFERRING PROVIDER: Dr Smith Robert  REFERRING DIAG:  L UE lymphedema - L breast Cancer  THERAPY DIAG:  Lymphedema, not elsewhere classified  Rationale for Evaluation and Treatment: Rehabilitation  ONSET DATE: 08/21/23  SUBJECTIVE:                                                                                                                                                                                           SUBJECTIVE STATEMENT: Patient reports she is here today after being refer by by Dr Smith Robert - pt was newly diagnosed left breast cancer- per pt swelling in L arm started after her biopsy 08/21/23.   PERTINENT HISTORY:  Patient was diagnosed with left  breast cancer - had biopsy on 08/21/23 and developed lymphedema in L UE after that - pt was refer by DR Smith Robert on 09/04/23 - pt is schedule to meet with Dr Maia Plan later this week about  plan is to have mastectomy/lumpectomy.   PATIENT GOALS:   reduce lymphedema  and learn post op HEP.   PAIN:   Are you having pain?6/10 pain L shoulder into upper arm with any AROM  PRECAUTIONS: Active CA , L UE lymphedema    HAND DOMINANCE: right  WEIGHT BEARING RESTRICTIONS: No  FALLS:  Has patient fallen in last 6 months? No  LIVING ENVIRONMENT: Patient lives with: [place]  3's adult sons that checks on patient daily.  Patient lives in a [place] house with one-step to get in with a railing.  Has a basement but does not have to go into it.  Patient has a walk-in shower with a railing as well as a seat and a hand-held shower.  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  But mostly sitting in her lift chair at the moment   OBJECTIVE:  COGNITION: Overall cognitive status: Within functional limits for tasks assessed    POSTURE:  Forward head and rounded shoulders posture Patient has history of thoracic and lumbar pain Seen pain clinic Dr. Cherylann Ratel in the past  UPPER EXTREMITY AROM/PROM:  A/PROM RIGHT   eval   Shoulder extension   Shoulder flexion 130  Shoulder abduction 108  Shoulder internal rotation   Shoulder external rotation 80    (Blank rows = not tested)  A/PROM LEFT   eval  Shoulder extension   Shoulder flexion 115  Shoulder abduction 100  Shoulder internal rotation   Shoulder external rotation 60    (Blank rows = not tested)  CERVICAL AROM: All within normal limits:    UPPER EXTREMITY STRENGTH: 4+/5 on the right.  Left not assessed  LYMPHEDEMA ASSESSMENTS:   LANDMARK RIGHT   eval  10 cm proximal to olecranon process 15 cm - 29.5 26cm  Olecranon process 22.5  10 cm proximal to ulnar styloid process 15 cm - 20.5 16.5 cm  Just proximal to ulnar styloid process 14  Across hand at thumb web space 16.5  At base of 2nd digit   (Blank rows = not tested)  LANDMARK LEFT   eval  10 cm proximal to olecranon process 15cm - 31.5 30.7 cm  Olecranon process  26.7  10 cm proximal to ulnar styloid process 15 cm - 25.8 20.5  Just proximal to ulnar styloid process 16  Across hand at thumb web space 17.5  At base of 2nd digit   (Blank rows = not tested)  L-DEX LYMPHEDEMA SCREENING:  NOT DONE Pt HAS PACEMAKER      PATIENT EDUCATION:  Education details: Lymphedema education done  and  shoulder/posture HEP Person educated: Patient and son Education method: Programmer, multimedia, Facilities manager, Handout Education comprehension: Patient verbalized understanding and returned demonstration  HOME EXERCISE PROGRAM: Patient and son was educated for patient to perform 3 times a day in supine using a cane or golf club shoulder flexion as well as horizontal abduction-using active assisted range of motion 12-15 reps As well as active assisted range of motion for external rotation in supine with hand behind head. Still plainly feeling a slight pull  Patient was fitted with Isotoner glove on the left hand as well as a Tubigrip D from hand to axilla.  Patient and son educated in wearing it most all the time day and night taking it off in the morning in the evening for about 2 hours. Patient was educated in donning and doffing without causing shoulder pain.    ASSESSMENT:  CLINICAL IMPRESSION: Ms Rosten  multidisciplinary medical team has met to assess and determine a recommended treatment plan for her left breast cancer.  She had a biopsy on 08/21/2023 and developed what appeared to be immediately left upper extremity lymphedema.  Patient was referred to OT  by Dr. Smith Robert.  Patient present with increased circumference of left upper extremity with upper arm increased by 4.7 cm, elbow for 4.2 cm, forearm 5.3 cm , wrist 2 cm and hand 1 cm -patient cannot be fitted with a over-the-counter compression sleeve because of increased circumference.  Patient was fitted with a Isotoner glove with a Tubigrip D from hand to axilla.  Patient and son was educated for patient to wear it  24/7.  She can take it off 2 hours in the morning in the evening or during bathing and dressing.  Will follow-up with her next week.  If circumference is decreased we will get patient measure for a compression sleeve and glove.  Patient was educated for home program for shoulder active assisted range of motion in supine.  Patient is seeing the surgeon Dr. Maia Plan later this week.  Patient was not appropriate for SOZO L-Dex screen because patient has pacemaker. Pt will benefit from skilled therapeutic intervention to improve on the following deficits: Decreased knowledge of precautions and lymphedema education, CDT treatment, impaired UE functional use, pain, decreased ROM, postural dysfunction.   OT treatment/interventions: ADL/self-care home management, pt/family education, therapeutic exercise,manual therapy, MLD, bandaging,   REHAB POTENTIAL: Good  CLINICAL DECISION MAKING: Stable/uncomplicated  EVALUATION COMPLEXITY: Moderate   GOALS: Goals reviewed with patient? YES  LONG TERM GOALS: (STG=LTG)    Name Target Date Goal status  1 Pt will show decrease circumference in left upper extremity to be fitted with appropriate compression garments to maintain lymphedema and prevent infection Baseline:  No knowledge 12 weeks Initial  2 Pt will be able to return demo and/or verbalize understanding of the post op HEP related to regaining shoulder ROM. Baseline:  No knowledge 12 weeks Progressing       4 Pt will demo she has regained full shoulder ROM and function post operatively compared to baselines.  Baseline: See objective measurements taken today. 12 weeks Initial    PLAN:  OT FREQUENCY/DURATION: EVAL   and 1-2 x wk   PLAN FOR NEXT SESSION:12 wks    T Occupational Therapy Information for After Breast Cancer Surgery/Treatment:  Lymphedema is a swelling condition that you may be at risk for in your arm if you have lymph nodes removed from the armpit area.  After a sentinel node biopsy,  the risk is approximately 5-9% and is higher after an axillary node dissection.  There is treatment available for this condition and it is not life-threatening.  Contact your physician or occupational therapist with concerns. You may begin the 4 shoulder/posture exercises (see additional sheet) when permitted by your physician (typically a week after surgery).  If you have drains, you may need to wait until those are removed before beginning range of motion exercises.  A general recommendation is to not lift your arms above shoulder height until drains are removed.  These exercises should be done to your tolerance and gently.  This is not a "no pain/no gain" type of recovery so listen to your body and stretch into the range of motion that you can tolerate, stopping if you have pain.  If you are having immediate reconstruction, ask your plastic surgeon about doing exercises as he or she may want you to wait. .  While undergoing any medical procedure or treatment, try to avoid blood pressure being taken or needle sticks from occurring on the arm on the side of cancer.   This recommendation begins after surgery and continues for the rest of your life.  This may help reduce your risk of getting lymphedema (swelling in your arm). An excellent resource for those seeking information on lymphedema is the National Lymphedema Network's web site. It can be accessed at www.lymphnet.org If you notice swelling in your hand, arm or breast at any time following surgery (even if it is many years from now), please contact your doctor or occupational therapist to discuss this.  Lymphedema can be treated at any time but it is easier for you if it is treated early on.  If you feel like your shoulder motion is not returning to normal in a reasonable amount of time, please contact your surgeon or occupational therapist.  [facility] [phone]. [address] [place], [place]  [address]      Oletta Cohn, OTR/L,CLT 09/11/2023, 6:30 PM

## 2023-09-12 DIAGNOSIS — I2510 Atherosclerotic heart disease of native coronary artery without angina pectoris: Secondary | ICD-10-CM | POA: Diagnosis not present

## 2023-09-12 DIAGNOSIS — C50412 Malignant neoplasm of upper-outer quadrant of left female breast: Secondary | ICD-10-CM | POA: Diagnosis not present

## 2023-09-12 DIAGNOSIS — I442 Atrioventricular block, complete: Secondary | ICD-10-CM | POA: Diagnosis not present

## 2023-09-12 DIAGNOSIS — I272 Pulmonary hypertension, unspecified: Secondary | ICD-10-CM | POA: Diagnosis not present

## 2023-09-12 DIAGNOSIS — I152 Hypertension secondary to endocrine disorders: Secondary | ICD-10-CM | POA: Diagnosis not present

## 2023-09-12 DIAGNOSIS — Z170 Estrogen receptor positive status [ER+]: Secondary | ICD-10-CM | POA: Diagnosis not present

## 2023-09-12 DIAGNOSIS — E1159 Type 2 diabetes mellitus with other circulatory complications: Secondary | ICD-10-CM | POA: Diagnosis not present

## 2023-09-13 DIAGNOSIS — Z950 Presence of cardiac pacemaker: Secondary | ICD-10-CM | POA: Diagnosis not present

## 2023-09-13 DIAGNOSIS — E1159 Type 2 diabetes mellitus with other circulatory complications: Secondary | ICD-10-CM | POA: Diagnosis not present

## 2023-09-13 DIAGNOSIS — Z01818 Encounter for other preprocedural examination: Secondary | ICD-10-CM | POA: Diagnosis not present

## 2023-09-13 DIAGNOSIS — I2510 Atherosclerotic heart disease of native coronary artery without angina pectoris: Secondary | ICD-10-CM | POA: Diagnosis not present

## 2023-09-13 DIAGNOSIS — I152 Hypertension secondary to endocrine disorders: Secondary | ICD-10-CM | POA: Diagnosis not present

## 2023-09-13 DIAGNOSIS — I442 Atrioventricular block, complete: Secondary | ICD-10-CM | POA: Diagnosis not present

## 2023-09-16 ENCOUNTER — Ambulatory Visit: Admitting: Occupational Therapy

## 2023-09-16 DIAGNOSIS — I890 Lymphedema, not elsewhere classified: Secondary | ICD-10-CM

## 2023-09-18 ENCOUNTER — Encounter: Payer: Self-pay | Admitting: Occupational Therapy

## 2023-09-18 NOTE — Therapy (Signed)
 OUTPATIENT OCCUPATIONAL THERAPY BREAST CANCER BASELINE EVALUATION   Patient Name: Emily Faulkner MRN: [id number] DOB:February 23, 1941, 83 y.o., female Today's Date: 09/18/2023  END OF SESSION:  OT End of Session - 09/18/23 1637     Visit Number 2    Number of Visits 12    Date for OT Re-Evaluation 12/04/23    OT Start Time 1435    OT Stop Time 1520    OT Time Calculation (min) 45 min    Activity Tolerance Patient tolerated treatment well    Behavior During Therapy [facility] for tasks assessed/performed             Past Medical History:  Diagnosis Date   Acute kidney failure (HCC) 11/16/2020   AKI (acute kidney injury) (HCC) 03/17/2018   Anemia    Cancer (HCC)    breast cancer right mastectomy   Diabetes mellitus without complication (HCC)    GIB (gastrointestinal bleeding) 02/20/2018   Glaucoma    Hyperkalemia 10/24/2020   Hyperlipidemia    Hypertension    Incontinence of bowel    Neuropathy    Odynophagia 10/24/2020   Osteoarthritis    Presence of permanent cardiac pacemaker    Rheumatoid arthritis (HCC)    Syncope and collapse 10/24/2020   Telangiectasias    Past Surgical History:  Procedure Laterality Date   BREAST BIOPSY Left 08/21/2023   [place] LT BREAST BX W LOC DEV 1ST LESION IMG BX SPEC US GUIDE 08/21/2023 [facility]   BREAST BIOPSY Left 08/21/2023   [place] LT BREAST BX W LOC DEV EA ADD LESION IMG BX SPEC US GUIDE 08/21/2023 [facility]   BREAST SURGERY     right mastectomy   CHOLECYSTECTOMY     COLONOSCOPY     COLONOSCOPY WITH PROPOFOL N/A 08/14/2016   Procedure: COLONOSCOPY WITH PROPOFOL;  Surgeon: Christena Deem, MD;  Location: [facility] ENDOSCOPY;  Service: Endoscopy;  Laterality: N/A;   COLONOSCOPY WITH PROPOFOL N/A 02/22/2018   Procedure: COLONOSCOPY WITH PROPOFOL;  Surgeon: Wyline Mood, MD;  Location: [facility] ENDOSCOPY;  Service: Gastroenterology;  Laterality: N/A;   ENTEROSCOPY N/A 04/12/2021   Procedure: ENTEROSCOPY;  Surgeon: Toney Reil, MD;   Location: [facility] ENDOSCOPY;  Service: Gastroenterology;  Laterality: N/A;  push   ESOPHAGOGASTRODUODENOSCOPY (EGD) WITH PROPOFOL N/A 08/14/2016   Procedure: ESOPHAGOGASTRODUODENOSCOPY (EGD) WITH PROPOFOL;  Surgeon: Christena Deem, MD;  Location: [facility] ENDOSCOPY;  Service: Endoscopy;  Laterality: N/A;   ESOPHAGOGASTRODUODENOSCOPY (EGD) WITH PROPOFOL N/A 01/30/2018   Procedure: ESOPHAGOGASTRODUODENOSCOPY (EGD) WITH PROPOFOL;  Surgeon: Toledo, Boykin Nearing, MD;  Location: [facility] ENDOSCOPY;  Service: Gastroenterology;  Laterality: N/A;   ESOPHAGOGASTRODUODENOSCOPY (EGD) WITH PROPOFOL N/A 02/22/2018   Procedure: ESOPHAGOGASTRODUODENOSCOPY (EGD) WITH PROPOFOL;  Surgeon: Wyline Mood, MD;  Location: [facility] ENDOSCOPY;  Service: Gastroenterology;  Laterality: N/A;   EXCISIONAL HEMORRHOIDECTOMY     IR KYPHO THORACIC WITH BONE BIOPSY  05/14/2023   IR RADIOLOGIST EVAL & MGMT  08/07/2022   IR RADIOLOGIST EVAL & MGMT  09/12/2022   IR RADIOLOGIST EVAL & MGMT  05/02/2023   IR RADIOLOGIST EVAL & MGMT  05/02/2023   IR RADIOLOGIST EVAL & MGMT  05/23/2023   KYPHOPLASTY N/A 03/20/2018   Procedure: [id number];  Surgeon: Kennedy Bucker, MD;  Location: [facility] ORS;  Service: Orthopedics;  Laterality: N/A;   PACEMAKER IMPLANT N/A 10/26/2020   Procedure: PACEMAKER IMPLANT;  Surgeon: Marcina Millard, MD;  Location: [facility] INVASIVE CV LAB;  Service: Cardiovascular;  Laterality: N/A;   RADIOLOGY WITH ANESTHESIA N/A 08/31/2022   Procedure: CT renal microwave  ablation;  Surgeon: Pernell Dupre, MD;  Location: [facility] OR;  Service: Radiology;  Laterality: N/A;   Patient Active Problem List   Diagnosis Date Noted   Thoracic facet joint syndrome 08/06/2023   Parotiditis 05/28/2023   Hyperbilirubinemia 05/28/2023   Thrombocytopenia (HCC) 05/28/2023   Mass of lower outer quadrant of left breast 04/10/2023   Closed wedge compression fracture of T10 vertebra (HCC) 04/09/2023   Intractable back pain 04/08/2023   Breast cancer (HCC) 04/08/2023    Renal mass, left 08/31/2022   Renal cancer (HCC) 08/31/2022   Acquired arteriovenous malformation of small intestine    Coronary artery disease involving native coronary artery of native heart 11/10/2020   Symptomatic bradycardia 11/10/2020   Stage 3b chronic kidney disease (HCC) 11/10/2020   Closed fracture of distal end of right fibula 10/28/2020   Complete heart block (HCC) 10/24/2020   Type 2 diabetes mellitus with stage 3 chronic kidney disease, with long-term current use of insulin (HCC) 09/16/2018   Age-related osteoporosis without current pathological fracture 05/09/2018   Renal mass 03/17/2018   Microalbuminuric diabetic nephropathy (HCC) 11/02/2016   Pulmonary hypertension (HCC) 09/11/2016   Hypertension 09/06/2016   High cholesterol 09/06/2016   Iron deficiency anemia 04/15/2016   Type 2 diabetes mellitus (HCC) 08/04/2014   Hyperlipidemia, unspecified 01/22/2014   Personal history of breast cancer 07/17/2012    PCP: Dr Laurina Bustle  REFERRING PROVIDER: Dr Smith Robert  REFERRING DIAG:  L UE lymphedema - L breast Cancer  THERAPY DIAG:  Lymphedema, not elsewhere classified  Rationale for Evaluation and Treatment: Rehabilitation  ONSET DATE: 08/21/23  SUBJECTIVE:                                                                                                                                                                                           SUBJECTIVE STATEMENT: See below in tx note  PERTINENT HISTORY:  Patient was diagnosed with left  breast cancer - had biopsy on 08/21/23 and developed lymphedema in L UE after that - pt was refer by DR Smith Robert on 09/04/23 - pt is schedule to meet with Dr Maia Plan later this week about  plan is to have mastectomy/lumpectomy.   PATIENT GOALS:   reduce lymphedema  and learn post op HEP.   PAIN:  Are you having pain?6/10 pain L shoulder into upper arm with any AROM  PRECAUTIONS: Active CA , L UE lymphedema    HAND DOMINANCE:  right  WEIGHT BEARING RESTRICTIONS: No  FALLS:  Has patient fallen in last 6 months? No  LIVING ENVIRONMENT: Patient lives with: [place]  3's adult sons that  checks on patient daily.  Patient lives in a [place] house with one-step to get in with a railing.  Has a basement but does not have to go into it.  Patient has a walk-in shower with a railing as well as a seat and a hand-held shower.  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  But mostly sitting in her lift chair at the moment   OBJECTIVE:  Manual Therapy:   Pt seen for circumferential measurements of left UE with comparison, see below chart for details.  Reassessment of edema and fit of compression glove.  Pt fitted with new, smaller compression glove since current one is too large and not offering any additional compression.  Issued additional tubigrip D light compression to wear during the day and at night, can remove for a couple hours each day.    Therapeutic Exercises:   Patient seen for ROM and stretching exercises, performed in sitting this date, pt reports she has difficulty with lying flat. Shoulder flexion to 100 degrees, horizontal ABD, chest press and added diagonal patterns with hand at the end of the cane, 1 set of each exercise for 15 reps with cues for proper form and technique.  Pt to continue to perform 3 times a day in semi reclined position in chair, using a cane.  Active assisted range of motion for external rotation in recliner with hand behind head. Pt continues to demonstrate increased tightness in pec area.    COGNITION: Overall cognitive status: Within functional limits for tasks assessed    POSTURE:  Forward head and rounded shoulders posture Patient has history of thoracic and lumbar pain Seen pain clinic Dr. Cherylann Ratel in the past  UPPER EXTREMITY AROM/PROM:  A/PROM RIGHT   eval   Shoulder  extension   Shoulder flexion 130  Shoulder abduction 108  Shoulder internal rotation   Shoulder external rotation 80    (Blank rows = not tested)  A/PROM LEFT   eval  Shoulder extension   Shoulder flexion 115  Shoulder abduction 100  Shoulder internal rotation   Shoulder external rotation 60    (Blank rows = not tested)  CERVICAL AROM: All within normal limits:    UPPER EXTREMITY STRENGTH: 4+/5 on the right.  Left not assessed  LYMPHEDEMA ASSESSMENTS:   LANDMARK RIGHT   eval  10 cm proximal to olecranon process 15 cm - 29.5 26cm  Olecranon process 22.5  10 cm proximal to ulnar styloid process 15 cm - 20.5 16.5 cm  Just proximal to ulnar styloid process 14  Across hand at thumb web space 16.5  At base of 2nd digit   (Blank rows = not tested)  LANDMARK LEFT   eval Left 09/16/2023  10 cm proximal to olecranon process 15cm - 31.5 30.7 cm 28.2 cm  Olecranon process 26.7 25 cm  10 cm proximal to ulnar styloid process 15 cm - 25.8 20.5 20.1 cm  Just proximal to ulnar styloid process 16 15.1 cm  Across hand at thumb web space 17.5 17.2 cm  At base of 2nd digit    (Blank rows = not tested)  L-DEX LYMPHEDEMA SCREENING:  NOT DONE Pt HAS PACEMAKER    PATIENT EDUCATION:  Education details: Lymphedema education done  and  shoulder/posture HEP Person educated: Patient and son Education method: Explanation, Demonstration, Handout Education comprehension: Patient verbalized understanding and returned demonstration  HOME EXERCISE PROGRAM: See above for exercises with cane.  ASSESSMENT:  CLINICAL IMPRESSION: Ms Mallozzi multidisciplinary medical team has met to assess and determine a recommended treatment plan for her left breast cancer.  She had a biopsy on 08/21/2023 and developed what appeared to be immediately left upper extremity lymphedema.  Patient was referred to OT by Dr. Smith Robert.  Patient present with increased circumference of left upper extremity with upper arm  increased by 4.7 cm, elbow for 4.2 cm, forearm 5.3 cm , wrist 2 cm and hand 1 cm -patient cannot be fitted with a over-the-counter compression sleeve because of increased circumference.  Patient was fitted with a Isotoner glove with a Tubigrip D from hand to axilla.  Patient and son was educated for patient to wear it 24/7.  She can take it off 2 hours in the morning in the evening or during bathing and dressing.  Patient reviewed home program for shoulder active assisted range of motion in semi supine.  Pt  Patient was not appropriate for SOZO L-Dex screen because patient has pacemaker. Pt demonstrated good progress with decongestion of left UE with use of light compression with tubigrip and glove.  Her circumferential measurements improved, will continue with current plan with smaller glove and tubigrip for another 1-2 weeks and reassess since her numbers are still elevated compared to right.  As she continues to decrease, she will likely be ready for compression garment fitting by next session.  Pt to continue with HEP and current plan Pt will benefit from skilled therapeutic intervention to improve on the following deficits: Decreased knowledge of precautions and lymphedema education, CDT treatment, impaired UE functional use, pain, decreased ROM, postural dysfunction.   OT treatment/interventions: ADL/self-care home management, pt/family education, therapeutic exercise,manual therapy, MLD, bandaging,   REHAB POTENTIAL: Good  CLINICAL DECISION MAKING: Stable/uncomplicated  EVALUATION COMPLEXITY: Moderate   GOALS: Goals reviewed with patient? YES  LONG TERM GOALS: (STG=LTG)    Name Target Date Goal status  1 Pt will show decrease circumference in left upper extremity to be fitted with appropriate compression garments to maintain lymphedema and prevent infection Baseline:  No knowledge 12 weeks Initial  2 Pt will be able to return demo and/or verbalize understanding of the post op HEP related  to regaining shoulder ROM. Baseline:  No knowledge 12 weeks Progressing       4 Pt will demo she has regained full shoulder ROM and function post operatively compared to baselines.  Baseline: See objective measurements taken today. 12 weeks Initial    PLAN:  OT FREQUENCY/DURATION: EVAL   and 1-2 x wk   PLAN FOR NEXT SESSION:12 wks    T Occupational Therapy Information for After Breast Cancer Surgery/Treatment:  Lymphedema is a swelling condition that you may be at risk for in your arm if you have lymph nodes removed from the armpit area.  After a sentinel node biopsy, the risk is approximately 5-9% and is higher after an axillary node dissection.  There is treatment available for this condition and it is not life-threatening.  Contact your physician or occupational therapist with concerns. You may begin the 4 shoulder/posture exercises (see additional sheet) when permitted by your physician (typically a week after surgery).  If you have drains, you may need to wait until those are removed before beginning range of motion exercises.  A general recommendation is to not lift your arms above shoulder height until drains are removed.  These exercises should be done to your tolerance and gently.  This is not a "no pain/no gain" type  of recovery so listen to your body and stretch into the range of motion that you can tolerate, stopping if you have pain.  If you are having immediate reconstruction, ask your plastic surgeon about doing exercises as he or she may want you to wait. .  While undergoing any medical procedure or treatment, try to avoid blood pressure being taken or needle sticks from occurring on the arm on the side of cancer.   This recommendation begins after surgery and continues for the rest of your life.  This may help reduce your risk of getting lymphedema (swelling in your arm). An excellent resource for those seeking information on lymphedema is the National Lymphedema Network's web  site. It can be accessed at www.lymphnet.org If you notice swelling in your hand, arm or breast at any time following surgery (even if it is many years from now), please contact your doctor or occupational therapist to discuss this.  Lymphedema can be treated at any time but it is easier for you if it is treated early on.  If you feel like your shoulder motion is not returning to normal in a reasonable amount of time, please contact your surgeon or occupational therapist.  [facility] [phone]. [address] [place], [place] [address]      Derrek Gu, OTR/L,CLT 09/18/2023, 9:17 PM

## 2023-09-19 DIAGNOSIS — I442 Atrioventricular block, complete: Secondary | ICD-10-CM | POA: Diagnosis not present

## 2023-09-24 ENCOUNTER — Other Ambulatory Visit: Payer: Self-pay | Admitting: Oncology

## 2023-09-24 ENCOUNTER — Telehealth: Payer: Self-pay | Admitting: *Deleted

## 2023-09-24 ENCOUNTER — Telehealth: Payer: Self-pay | Admitting: Oncology

## 2023-09-24 ENCOUNTER — Other Ambulatory Visit: Payer: Self-pay

## 2023-09-24 DIAGNOSIS — M546 Pain in thoracic spine: Secondary | ICD-10-CM

## 2023-09-24 DIAGNOSIS — S22070D Wedge compression fracture of T9-T10 vertebra, subsequent encounter for fracture with routine healing: Secondary | ICD-10-CM

## 2023-09-24 MED ORDER — HYDROCODONE-ACETAMINOPHEN 5-325 MG PO TABS: 1.0000 | ORAL_TABLET | Freq: Three times a day (TID) | ORAL | 0 refills | Status: DC | PRN

## 2023-09-24 NOTE — Telephone Encounter (Signed)
 The patient did not know about the appt. And the patient was thinking that she would not be seening Smith Robert til after the operation. Also she has having a lot of pain of left arm. She is on the last pill today. Her original order was 06/19/2023. She would like to get another refill if MD ok . The person that gave her med was Drake Leach- PA

## 2023-09-24 NOTE — Telephone Encounter (Signed)
 Ok to renew for 30 tab for now

## 2023-09-24 NOTE — Telephone Encounter (Signed)
 Patient called saying that she spoke to Dr. Assunta Gambles nurse this morning  and was told she would get a call back but never heard back. She will be here tomorrow and wanted me to reach back out about the pain meds

## 2023-09-24 NOTE — Telephone Encounter (Signed)
 Which last pill?

## 2023-09-24 NOTE — Telephone Encounter (Signed)
 I did sign her pain meds. I actually dont need to see her tomorrow since she has not had her surgery unless she wants to. I can see her 3 weeks from now

## 2023-09-25 ENCOUNTER — Inpatient Hospital Stay: Payer: Medicare HMO | Admitting: Oncology

## 2023-10-02 ENCOUNTER — Other Ambulatory Visit: Payer: Self-pay

## 2023-10-02 ENCOUNTER — Encounter: Payer: Self-pay | Admitting: Oncology

## 2023-10-02 DIAGNOSIS — Z170 Estrogen receptor positive status [ER+]: Secondary | ICD-10-CM | POA: Diagnosis not present

## 2023-10-02 DIAGNOSIS — C50412 Malignant neoplasm of upper-outer quadrant of left female breast: Secondary | ICD-10-CM | POA: Diagnosis not present

## 2023-10-02 MED ORDER — TAMOXIFEN CITRATE 20 MG PO TABS: 20.0000 mg | ORAL_TABLET | Freq: Every day | ORAL | 3 refills | Status: DC

## 2023-10-15 ENCOUNTER — Telehealth: Payer: Self-pay | Admitting: *Deleted

## 2023-10-15 NOTE — Telephone Encounter (Signed)
 Spoke with patient.  Left arm pain is not improving with use of Hydrocodone, 9/10 pain scale.    Reports she does use a compression sleeve that helps with lymphedema but not the pain.  She is wanting to know if there is something else she can take to help with the pain?  Has not decided on what treatment route she will be taking.  Wanted to wait until appt with Dr. Sheila Ocasio Robert on 10/18/23 to decide.

## 2023-10-15 NOTE — Telephone Encounter (Signed)
 Please give this patient a return call, she needs a refill on her pain medication, and she needs something stronger. She said that the one she has does not seem to be working.

## 2023-10-15 NOTE — Telephone Encounter (Signed)
 This patient with locally advanced breast cancer/ adenopathy and LUE swelling. She is high risk for surgery. What else can we do to improve her pain? Josh- any role for interventional pain mx for brachial plexus block?  Megan- please have her josh with me this week

## 2023-10-16 ENCOUNTER — Other Ambulatory Visit: Payer: Self-pay | Admitting: *Deleted

## 2023-10-16 ENCOUNTER — Telehealth: Payer: Self-pay | Admitting: *Deleted

## 2023-10-16 MED ORDER — OXYCODONE HCL 10 MG PO TABS: 10.0000 mg | ORAL_TABLET | Freq: Four times a day (QID) | ORAL | 0 refills | Status: DC | PRN

## 2023-10-16 NOTE — Telephone Encounter (Signed)
 I called the patient and let her know that shw ill stop the hydrocodone  and dr Smith Robert send in oxycodone 10 mg and she can have it every 6 hours. She is ok with that.

## 2023-10-16 NOTE — Telephone Encounter (Signed)
 The patient called stating that her left arm is hurting. She put the sleeve one and the swelling is good. When she puts the sleeve on it makes a lot of pain. The hydrocodone she got is not helping for the pain. She thinks she needs another kind of pain med.

## 2023-10-16 NOTE — Telephone Encounter (Signed)
 Please have her stop hydrocodone and switch her to oxycodone 10 mg q4 prn

## 2023-10-18 ENCOUNTER — Inpatient Hospital Stay (HOSPITAL_BASED_OUTPATIENT_CLINIC_OR_DEPARTMENT_OTHER): Admitting: Hospice and Palliative Medicine

## 2023-10-18 ENCOUNTER — Inpatient Hospital Stay: Attending: Oncology | Admitting: Oncology

## 2023-10-18 ENCOUNTER — Encounter: Payer: Self-pay | Admitting: Oncology

## 2023-10-18 VITALS — BP 120/70 | HR 89 | Temp 98.8°F | Resp 19 | Ht 64.0 in | Wt 134.0 lb

## 2023-10-18 DIAGNOSIS — G893 Neoplasm related pain (acute) (chronic): Secondary | ICD-10-CM | POA: Insufficient documentation

## 2023-10-18 DIAGNOSIS — Z79810 Long term (current) use of selective estrogen receptor modulators (SERMs): Secondary | ICD-10-CM | POA: Insufficient documentation

## 2023-10-18 DIAGNOSIS — Z66 Do not resuscitate: Secondary | ICD-10-CM | POA: Diagnosis not present

## 2023-10-18 DIAGNOSIS — D500 Iron deficiency anemia secondary to blood loss (chronic): Secondary | ICD-10-CM | POA: Diagnosis not present

## 2023-10-18 DIAGNOSIS — C50212 Malignant neoplasm of upper-inner quadrant of left female breast: Secondary | ICD-10-CM

## 2023-10-18 DIAGNOSIS — C50412 Malignant neoplasm of upper-outer quadrant of left female breast: Secondary | ICD-10-CM | POA: Insufficient documentation

## 2023-10-18 DIAGNOSIS — Z170 Estrogen receptor positive status [ER+]: Secondary | ICD-10-CM

## 2023-10-18 DIAGNOSIS — Z515 Encounter for palliative care: Secondary | ICD-10-CM | POA: Insufficient documentation

## 2023-10-18 DIAGNOSIS — Z7189 Other specified counseling: Secondary | ICD-10-CM | POA: Diagnosis not present

## 2023-10-18 DIAGNOSIS — K5521 Angiodysplasia of colon with hemorrhage: Secondary | ICD-10-CM | POA: Diagnosis not present

## 2023-10-18 DIAGNOSIS — Z1732 Human epidermal growth factor receptor 2 negative status: Secondary | ICD-10-CM | POA: Insufficient documentation

## 2023-10-18 MED ORDER — GABAPENTIN 100 MG PO CAPS: 100.0000 mg | ORAL_CAPSULE | Freq: Three times a day (TID) | ORAL | 0 refills | Status: DC

## 2023-10-18 NOTE — Progress Notes (Signed)
 Palliative Medicine [facility] at [facility] [phone] [phone]) [phone]   Name: Emily Faulkner Date: 10/18/2023 MRN: [id number]  DOB: 06-May-1941  Patient Care Team: Marina Goodell, MD as PCP - General (Family Medicine) [name], MD as Consulting Physician (Oncology)    REASON FOR CONSULTATION: Emily Faulkner is a 83 y.o. female with multiple medical problems including CKD stage III, hypertension, diabetes, a left renal mass, IDA secondary to AVMs, history of right breast cancer status post right mastectomy and 5 years of tamoxifen, now with left ER/PR, HER2 negative breast cancer.  Patient has had significant left arm pain.  She was referred to palliative care to address goals and manage ongoing symptoms.  SOCIAL HISTORY:     reports that she quit smoking about 12 years ago. Her smoking use included cigarettes. She has never used smokeless tobacco. She reports that she does not drink alcohol and does not use drugs.  Patient is widowed.  Lives at home alone.  Has 3 sons who are involved.  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  ADVANCE DIRECTIVES:  Does not have  CODE STATUS: DNR/DNI Emily Faulkner DNR order signed on/11/25)  PAST MEDICAL HISTORY: Past Medical History:  Diagnosis Date   Acute kidney failure (HCC) 11/16/2020   AKI (acute kidney injury) (HCC) 03/17/2018   Anemia    Cancer (HCC)    breast cancer right mastectomy   Diabetes mellitus without complication (HCC)    GIB (gastrointestinal bleeding) 02/20/2018   Glaucoma    Hyperkalemia 10/24/2020   Hyperlipidemia    Hypertension    Incontinence of bowel    Neuropathy    Odynophagia 10/24/2020   Osteoarthritis    Presence of permanent cardiac pacemaker    Rheumatoid arthritis (HCC)    Syncope and collapse 10/24/2020   Telangiectasias     PAST SURGICAL HISTORY:  Past Surgical History:  Procedure Laterality Date   BREAST BIOPSY Left 08/21/2023   [place]  LT BREAST BX W LOC DEV 1ST LESION IMG BX SPEC US GUIDE 08/21/2023 [facility]   BREAST BIOPSY Left 08/21/2023   [place] LT BREAST BX W LOC DEV EA ADD LESION IMG BX SPEC US GUIDE 08/21/2023 [facility]   BREAST SURGERY     right mastectomy   CHOLECYSTECTOMY     COLONOSCOPY     COLONOSCOPY WITH PROPOFOL N/A 08/14/2016   Procedure: COLONOSCOPY WITH PROPOFOL;  Surgeon: Christena Deem, MD;  Location: [facility] ENDOSCOPY;  Service: Endoscopy;  Laterality: N/A;   COLONOSCOPY WITH PROPOFOL N/A 02/22/2018   Procedure: COLONOSCOPY WITH PROPOFOL;  Surgeon: Wyline Mood, MD;  Location: [facility] ENDOSCOPY;  Service: Gastroenterology;  Laterality: N/A;   ENTEROSCOPY N/A 04/12/2021   Procedure: ENTEROSCOPY;  Surgeon: Toney Reil, MD;  Location: [facility] ENDOSCOPY;  Service: Gastroenterology;  Laterality: N/A;  push   ESOPHAGOGASTRODUODENOSCOPY (EGD) WITH PROPOFOL N/A 08/14/2016   Procedure: ESOPHAGOGASTRODUODENOSCOPY (EGD) WITH PROPOFOL;  Surgeon: Christena Deem, MD;  Location: [facility] ENDOSCOPY;  Service: Endoscopy;  Laterality: N/A;   ESOPHAGOGASTRODUODENOSCOPY (EGD) WITH PROPOFOL N/A 01/30/2018   Procedure: ESOPHAGOGASTRODUODENOSCOPY (EGD) WITH PROPOFOL;  Surgeon: Toledo, Boykin Nearing, MD;  Location: [facility] ENDOSCOPY;  Service: Gastroenterology;  Laterality: N/A;   ESOPHAGOGASTRODUODENOSCOPY (EGD) WITH PROPOFOL N/A 02/22/2018   Procedure: ESOPHAGOGASTRODUODENOSCOPY (EGD) WITH PROPOFOL;  Surgeon: Wyline Mood, MD;  Location: [facility] ENDOSCOPY;  Service: Gastroenterology;  Laterality: N/A;   EXCISIONAL HEMORRHOIDECTOMY     IR KYPHO THORACIC WITH BONE BIOPSY  05/14/2023  IR RADIOLOGIST EVAL & MGMT  08/07/2022   IR RADIOLOGIST EVAL & MGMT  09/12/2022   IR RADIOLOGIST EVAL & MGMT  05/02/2023   IR RADIOLOGIST EVAL & MGMT  05/02/2023   IR RADIOLOGIST EVAL & MGMT  05/23/2023   KYPHOPLASTY N/A 03/20/2018   Procedure: [id number];  Surgeon: Kennedy Bucker, MD;  Location: [facility] ORS;  Service: Orthopedics;  Laterality: N/A;    PACEMAKER IMPLANT N/A 10/26/2020   Procedure: PACEMAKER IMPLANT;  Surgeon: Marcina Millard, MD;  Location: [facility] INVASIVE CV LAB;  Service: Cardiovascular;  Laterality: N/A;   RADIOLOGY WITH ANESTHESIA N/A 08/31/2022   Procedure: CT renal microwave ablation;  Surgeon: Pernell Dupre, MD;  Location: [facility] OR;  Service: Radiology;  Laterality: N/A;    HEMATOLOGY/ONCOLOGY HISTORY:  Oncology History  Breast cancer (HCC)  04/08/2023 Initial Diagnosis   Breast cancer (HCC)   09/04/2023 Cancer Staging   Staging form: Breast, AJCC 8th Edition - Clinical stage from 09/04/2023: Stage IIA (cT2, cN1, cM0, G2, ER+, PR+, HER2-) - Signed by [name], MD on 09/06/2023 Stage prefix: Initial diagnosis Histologic grading system: 3 grade system     ALLERGIES:  is allergic to amoxicillin.  MEDICATIONS:  Current Outpatient Medications  Medication Sig Dispense Refill   ACCU-CHEK AVIVA PLUS test strip      ACCU-CHEK SOFTCLIX LANCETS lancets once daily Use as instructed.     alendronate (FOSAMAX) 70 MG tablet TAKE 1 TABLET BY MOUTH EVERY 7DAYS WITH A FULL GLASS OF WATER. DO NOT LIE DOWN FOR 30 MINUTES     Biotin 1000 MCG tablet Take 1,000 mcg by mouth daily.     Calcium Carb-Cholecalciferol (OYSTER SHELL CALCIUM W/D) 500-5 MG-MCG TABS Take 1 tablet by mouth daily.     Cholecalciferol 50 MCG (2000 UT) CAPS Take 2,000 Units by mouth daily.     feeding supplement, ENSURE ENLIVE, (ENSURE ENLIVE) LIQD Take 237 mLs by mouth 2 (two) times daily between meals. 237 mL 12   insulin glargine (LANTUS) 100 UNIT/ML Solostar Pen Inject 15 Units into the skin daily. (may take up to 50u based upon blood glucose reading) (Patient taking differently: Inject 28 Units into the skin daily. (may take up to 50u based upon blood glucose reading)) 15 mL 11   Insulin Pen Needle 32G X 4 MM MISC USE 1 NEEDLE SUBCUTANEOUSLY ONCE A DAY AS DIRECTED     losartan (COZAAR) 50 MG tablet Take 25 mg by mouth daily.     niacin 500 MG CR  capsule Take 500 mg by mouth at bedtime.     omeprazole (PRILOSEC) 40 MG capsule TAKE 1 CAPSULE BY MOUTH EVERY DAY BEFORE BREAKFAST 30 capsule 0   Oxycodone HCl 10 MG TABS Take 1 tablet (10 mg total) by mouth every 6 (six) hours as needed. 90 tablet 0   Probiotic Product (PROBIOTIC PO) Take 1 tablet by mouth daily. 2 billion     simvastatin (ZOCOR) 20 MG tablet Take 20 mg by mouth at bedtime.  6   sitaGLIPtin (JANUVIA) 50 MG tablet Take 50 mg by mouth daily.     tamoxifen (NOLVADEX) 20 MG tablet Take 1 tablet (20 mg total) by mouth daily. 30 tablet 3   TRAVATAN Z 0.004 % SOLN ophthalmic solution Place 1 drop into both eyes at bedtime.     TURMERIC PO Take 538 mg by mouth daily.     vitamin B-12 (CYANOCOBALAMIN) 1000 MCG tablet Take 2,000 mcg by mouth daily.     No current  facility-administered medications for this visit.    VITAL SIGNS: There were no vitals taken for this visit. There were no vitals filed for this visit.  Estimated body mass index is 23 kg/m as calculated from the following:   Height as of an earlier encounter on 10/18/23: 5\' 4"  (1.626 m).   Weight as of an earlier encounter on 10/18/23: 134 lb (60.8 kg).  LABS: CBC:    Component Value Date/Time   WBC 4.6 08/09/2023 1355   HGB 11.4 (L) 08/09/2023 1355   HGB 12.1 06/17/2023 1358   HCT 34.9 (L) 08/09/2023 1355   HCT 37.8 06/17/2023 1358   PLT 104 (L) 08/09/2023 1355   PLT 124 (L) 06/17/2023 1358   MCV 99.4 08/09/2023 1355   MCV 100 (H) 06/17/2023 1358   NEUTROABS 3.3 06/17/2023 1358   LYMPHSABS 0.7 06/17/2023 1358   MONOABS 0.3 05/30/2023 0525   EOSABS 0.2 06/17/2023 1358   BASOSABS 0.0 06/17/2023 1358   Comprehensive Metabolic Panel:    Component Value Date/Time   NA 143 06/17/2023 1358   K 3.6 06/17/2023 1358   CL 108 (H) 06/17/2023 1358   CO2 20 06/17/2023 1358   BUN 8 06/17/2023 1358   CREATININE 1.18 (H) 06/17/2023 1358   CREATININE 1.46 (H) 05/02/2023 1600   GLUCOSE 163 (H) 06/17/2023 1358    GLUCOSE 190 (H) 05/30/2023 0525   CALCIUM 9.9 06/17/2023 1358   AST 33 06/17/2023 1358   ALT 16 06/17/2023 1358   ALKPHOS 103 06/17/2023 1358   BILITOT 1.1 06/17/2023 1358   PROT 6.6 06/17/2023 1358   ALBUMIN 3.8 06/17/2023 1358    RADIOGRAPHIC STUDIES: No results found.  PERFORMANCE STATUS (ECOG) : 2 - Symptomatic, <50% confined to bed  Review of Systems Unless otherwise noted, a complete review of systems is negative.  Physical Exam General: NAD Cardiovascular: regular rate and rhythm Pulmonary: clear ant fields Abdomen: soft, nontender, + bowel sounds GU: no suprapubic tenderness Extremities: no edema, no joint deformities Skin: no rashes Neurological: Weakness but otherwise nonfocal  IMPRESSION: Patient with recent diagnosis of stage II ER/PR positive HER2 negative left breast cancer.  Patient has been started on tamoxifen.  She has seen surgeon but is not interested in pursuing chemotherapy or radiation.  Patient and son met today with Dr. Smith Robert.  Also met with them to discuss goals.  Patient tells me that she is not interested in pursuing surgery or chemotherapy.  She was told that XRT would require a 5-week course of treatment and patient feels that it is too much and she is too frail.  She would be willing to consider a short course of radiation but would otherwise opt to forego treatments and just focus on comfort and quality of life.  She would like to continue tamoxifen.  We did discuss the option of hospice in detail today.  I plan to give patient a call in 2 weeks.  If patient is interested, we will send a referral to hospice at that time.  Symptomatically, patient endorses severe and persistent left arm pain since her diagnosis of breast cancer.  Pain starts in the upper arm and shoots all the way down to her hand.  She describes it as sharp and at times feeling like electricity or burning.  No focal tenderness to her cervical spine and pain is not associated with  movement of her neck.  Left upper extremity is not edematous.  Of note ultrasound was recently done which was negative for DVT.  Patient was tried on Norco but did not find that this was helpful.  She was rotated to oxycodone 10 mg, which she has been taking every 6 hours around-the-clock.  She finds that that keeps the pain tolerable but she still has significant breakthrough pain.  Discussed options of starting a long-acting opioid versus gabapentin, as there does seem to be a neuropathic component to her pain.  Patient would prefer to trial gabapentin.  Will start low-dose in light of history of CKD.  Patient does not have advanced directives but has the paperwork at home and is in process of completing it.  Discussed CODE STATUS.  Patient states that she would not want to be resuscitated nor have her life prolonged artificially on machines.  She says that she is consistently communicated desire to be a DNR with her family.  I signed a DNR order for her to take home today.  PLAN: -Best supportive care - Patient considering hospice - Continue oxycodone 10 mg every 6 hours as needed for pain - Daily bowel regimen - Start gabapentin 100 mg 3 times daily - DNR/DNI - Follow-up telephone visit 2 weeks  Case and plan discussed with Dr. Smith Robert  Patient expressed understanding and was in agreement with this plan. She also understands that She can call the clinic at any time with any questions, concerns, or complaints.     Time Total: 25 minutes  Visit consisted of counseling and education dealing with the complex and emotionally intense issues of symptom management and palliative care in the setting of serious and potentially life-threatening illness.Greater than 50%  of this time was spent counseling and coordinating care related to the above assessment and plan.  Signed by: Laurette Schimke, PhD, NP-C

## 2023-10-19 ENCOUNTER — Encounter: Payer: Self-pay | Admitting: Oncology

## 2023-10-19 NOTE — Progress Notes (Signed)
 Hematology/Oncology Consult note [facility]  Telephone:([phone] [phone]  Patient Care Team: Lorrie Rothman, MD as PCP - General (Family Medicine) Avonne Boettcher, MD as Consulting Physician (Oncology)   Name of the patient: Emily Faulkner  [id number]  [birth year]   Date of visit: 10/19/23  Diagnosis- 1.  Iron deficiency anemia due to bleeding AVM 2.  Invasive mammary carcinoma of the left breast cT2 N1 MX ER/PR positive HER2 negative  Chief complaint/ Reason for visit- discuss further management of breast cancer  Heme/Onc history: Patient is a 83 year old female with a past medical history significant for stage III CKD, hypertension diabetes and left renal mass who was found to have a hemoglobin of 8.7 at nephrology office and hence referred the patient for anemia.  For a renal mass she has been followed by Dr. Ace Holder in the masses thought to be slow-growing and conservative management was recommended.  Patient presently reports ongoing fatigue.  Denies any blood loss in her stool or urine.  Denies any dark melanotic stools.  Patient has had a complete GI work-up including endoscopy colonoscopy and small bowel endoscopy in 2019 by Dr. Antony Baumgartner colonoscopy showed multiple bleeding colonic angioectasias treated with APC small bowel endoscopy showed nonbleeding angioectasias in the jejunum treated with APC   Patient found to have significant iron deficiency anemia with a hemoglobin of 6.9 requiring IV iron.  Also seen by Dr. Baldomero Bone and underwent push enteroscopy which did not show any evidence of active bleeding but showed chronic atrophic gastritis   Given persistent iron deficiency anemia due to bleeding AVMs patient was also started on monthly octreotide.  Currently octreotide is on hold   Patient was incidentally noted to have left breast mass on CT scans in September 2024 this was followed by diagnostic left breast mammogram which showed a 3.2 cm  mass in the left breast.  Along the lateral margins of the mass there was an enlarged intramammary lymph node.  Left axilla could not be well assessed due to patient positioning.  She had biopsy of the breast mass as well as the lymph node which showed grade 2 invasive mammary carcinoma with mixed ductal and lobular features ER 95% positive PR 100% positive HER2 negative Ki-67 15%.  Patient has a prior history of a right breast cancer over 30 years ago s/p right mastectomy and she took tamoxifen at that time for 5 years   PET scan showed hypermetabolic left breast mass consistent with known malignancy.  Soft tissue thickening in the left subpectoral region suspicious for lymph node involvement.  No evidence of distant metastatic disease.    Interval history- continues to have significant pain and swelling in her left arm  ECOG PS- 3 Pain scale- 6   Review of systems- Review of Systems  Constitutional:  Positive for malaise/fatigue. Negative for chills, fever and weight loss.  HENT:  Negative for congestion, ear discharge and nosebleeds.   Eyes:  Negative for blurred vision.  Respiratory:  Negative for cough, hemoptysis, sputum production, shortness of breath and wheezing.   Cardiovascular:  Negative for chest pain, palpitations, orthopnea and claudication.  Gastrointestinal:  Negative for abdominal pain, blood in stool, constipation, diarrhea, heartburn, melena, nausea and vomiting.  Genitourinary:  Negative for dysuria, flank pain, frequency, hematuria and urgency.  Musculoskeletal:  Negative for back pain, joint pain and myalgias.       LUE pain and swelling  Skin:  Negative for rash.  Neurological:  Negative  for dizziness, tingling, focal weakness, seizures, weakness and headaches.  Endo/Heme/Allergies:  Does not bruise/bleed easily.  Psychiatric/Behavioral:  Negative for depression and suicidal ideas. The patient does not have insomnia.       Allergies  Allergen Reactions    Amoxicillin Diarrhea     Past Medical History:  Diagnosis Date   Acute kidney failure (HCC) 11/16/2020   AKI (acute kidney injury) (HCC) 03/17/2018   Anemia    Cancer (HCC)    breast cancer right mastectomy   Diabetes mellitus without complication (HCC)    GIB (gastrointestinal bleeding) 02/20/2018   Glaucoma    Hyperkalemia 10/24/2020   Hyperlipidemia    Hypertension    Incontinence of bowel    Neuropathy    Odynophagia 10/24/2020   Osteoarthritis    Presence of permanent cardiac pacemaker    Rheumatoid arthritis (HCC)    Syncope and collapse 10/24/2020   Telangiectasias      Past Surgical History:  Procedure Laterality Date   BREAST BIOPSY Left 08/21/2023   [place] LT BREAST BX W LOC DEV 1ST LESION IMG BX SPEC US GUIDE 08/21/2023 [facility]   BREAST BIOPSY Left 08/21/2023   [place] LT BREAST BX W LOC DEV EA ADD LESION IMG BX SPEC US GUIDE 08/21/2023 [facility]   BREAST SURGERY     right mastectomy   CHOLECYSTECTOMY     COLONOSCOPY     COLONOSCOPY WITH PROPOFOL N/A 08/14/2016   Procedure: COLONOSCOPY WITH PROPOFOL;  Surgeon: Christena Deem, MD;  Location: [facility] ENDOSCOPY;  Service: Endoscopy;  Laterality: N/A;   COLONOSCOPY WITH PROPOFOL N/A 02/22/2018   Procedure: COLONOSCOPY WITH PROPOFOL;  Surgeon: Wyline Mood, MD;  Location: [facility] ENDOSCOPY;  Service: Gastroenterology;  Laterality: N/A;   ENTEROSCOPY N/A 04/12/2021   Procedure: ENTEROSCOPY;  Surgeon: Toney Reil, MD;  Location: [facility] ENDOSCOPY;  Service: Gastroenterology;  Laterality: N/A;  push   ESOPHAGOGASTRODUODENOSCOPY (EGD) WITH PROPOFOL N/A 08/14/2016   Procedure: ESOPHAGOGASTRODUODENOSCOPY (EGD) WITH PROPOFOL;  Surgeon: Christena Deem, MD;  Location: [facility] ENDOSCOPY;  Service: Endoscopy;  Laterality: N/A;   ESOPHAGOGASTRODUODENOSCOPY (EGD) WITH PROPOFOL N/A 01/30/2018   Procedure: ESOPHAGOGASTRODUODENOSCOPY (EGD) WITH PROPOFOL;  Surgeon: Toledo, Boykin Nearing, MD;  Location: [facility] ENDOSCOPY;  Service:  Gastroenterology;  Laterality: N/A;   ESOPHAGOGASTRODUODENOSCOPY (EGD) WITH PROPOFOL N/A 02/22/2018   Procedure: ESOPHAGOGASTRODUODENOSCOPY (EGD) WITH PROPOFOL;  Surgeon: Wyline Mood, MD;  Location: [facility] ENDOSCOPY;  Service: Gastroenterology;  Laterality: N/A;   EXCISIONAL HEMORRHOIDECTOMY     IR KYPHO THORACIC WITH BONE BIOPSY  05/14/2023   IR RADIOLOGIST EVAL & MGMT  08/07/2022   IR RADIOLOGIST EVAL & MGMT  09/12/2022   IR RADIOLOGIST EVAL & MGMT  05/02/2023   IR RADIOLOGIST EVAL & MGMT  05/02/2023   IR RADIOLOGIST EVAL & MGMT  05/23/2023   KYPHOPLASTY N/A 03/20/2018   Procedure: [id number];  Surgeon: Kennedy Bucker, MD;  Location: [facility] ORS;  Service: Orthopedics;  Laterality: N/A;   PACEMAKER IMPLANT N/A 10/26/2020   Procedure: PACEMAKER IMPLANT;  Surgeon: Marcina Millard, MD;  Location: [facility] INVASIVE CV LAB;  Service: Cardiovascular;  Laterality: N/A;   RADIOLOGY WITH ANESTHESIA N/A 08/31/2022   Procedure: CT renal microwave ablation;  Surgeon: Pernell Dupre, MD;  Location[facility];  Service: Radiology;  Laterality: N/A;    Social History   Socioeconomic History   Marital status: Widowed    Spouse name: Not on file   Number of children: Not on file   Years of education: Not on file   Highest education  level: Not on file  Occupational History   Not on file  Tobacco Use   Smoking status: Former    Current packs/day: 0.00    Types: Cigarettes    Quit date: 07/10/2011    Years since quitting: 12.2   Smokeless tobacco: Never  Vaping Use   Vaping status: Never Used  Substance and Sexual Activity   Alcohol use: No   Drug use: No   Sexual activity: Not Currently  Other Topics Concern   Not on file  Social History Narrative   Not on file   Social Drivers of Health   Financial Resource Strain: Low Risk  (09/12/2023)   Received from [facility]   Overall Financial Resource Strain (CARDIA)    Difficulty of Paying Living Expenses: Not hard at all  Food  Insecurity: No Food Insecurity (09/12/2023)   Received from [facility]   Hunger Vital Sign    Worried About Running Out of Food in the Last Year: Never true    Ran Out of Food in the Last Year: Never true  Transportation Needs: No Transportation Needs (09/12/2023)   Received from [facility] - [facility]    In the past 12 months, has lack of transportation kept you from medical appointments or from getting medications?: No    Lack of Transportation (Non-Medical): No  Physical Activity: Not on file  Stress: Not on file  Social Connections: Not on file  Intimate Partner Violence: Not At Risk (05/28/2023)   Humiliation, Afraid, Rape, and Kick questionnaire    Fear of Current or Ex-Partner: No    Emotionally Abused: No    Physically Abused: No    Sexually Abused: No    Family History  Problem Relation Age of Onset   Kidney cancer Son    Bladder Cancer Neg Hx    Breast cancer Neg Hx      Current Outpatient Medications:    ACCU-CHEK AVIVA PLUS test strip, , Disp: , Rfl:    ACCU-CHEK SOFTCLIX LANCETS lancets, once daily Use as instructed., Disp: , Rfl:    alendronate (FOSAMAX) 70 MG tablet, TAKE 1 TABLET BY MOUTH EVERY 7DAYS WITH A FULL GLASS OF WATER. DO NOT LIE DOWN FOR 30 MINUTES, Disp: , Rfl:    Biotin 1000 MCG tablet, Take 1,000 mcg by mouth daily., Disp: , Rfl:    Calcium Carb-Cholecalciferol (OYSTER SHELL CALCIUM W/D) 500-5 MG-MCG TABS, Take 1 tablet by mouth daily., Disp: , Rfl:    Cholecalciferol 50 MCG (2000 UT) CAPS, Take 2,000 Units by mouth daily., Disp: , Rfl:    feeding supplement, ENSURE ENLIVE, (ENSURE ENLIVE) LIQD, Take 237 mLs by mouth 2 (two) times daily between meals., Disp: 237 mL, Rfl: 12   gabapentin (NEURONTIN) 100 MG capsule, Take 1 capsule (100 mg total) by mouth 3 (three) times daily., Disp: 90 capsule, Rfl: 0   insulin glargine (LANTUS) 100 UNIT/ML Solostar Pen, Inject 15 Units into the skin daily. (may take up  to 50u based upon blood glucose reading) (Patient taking differently: Inject 28 Units into the skin daily. (may take up to 50u based upon blood glucose reading)), Disp: 15 mL, Rfl: 11   Insulin Pen Needle 32G X 4 MM MISC, USE 1 NEEDLE SUBCUTANEOUSLY ONCE A DAY AS DIRECTED, Disp: , Rfl:    losartan (COZAAR) 50 MG tablet, Take 25 mg by mouth daily., Disp: , Rfl:    niacin 500 MG CR capsule, Take 500  mg by mouth at bedtime., Disp: , Rfl:    omeprazole (PRILOSEC) 40 MG capsule, TAKE 1 CAPSULE BY MOUTH EVERY DAY BEFORE BREAKFAST, Disp: 30 capsule, Rfl: 0   Oxycodone HCl 10 MG TABS, Take 1 tablet (10 mg total) by mouth every 6 (six) hours as needed., Disp: 90 tablet, Rfl: 0   Probiotic Product (PROBIOTIC PO), Take 1 tablet by mouth daily. 2 billion, Disp: , Rfl:    simvastatin (ZOCOR) 20 MG tablet, Take 20 mg by mouth at bedtime., Disp: , Rfl: 6   sitaGLIPtin (JANUVIA) 50 MG tablet, Take 50 mg by mouth daily., Disp: , Rfl:    tamoxifen (NOLVADEX) 20 MG tablet, Take 1 tablet (20 mg total) by mouth daily., Disp: 30 tablet, Rfl: 3   TRAVATAN Z 0.004 % SOLN ophthalmic solution, Place 1 drop into both eyes at bedtime., Disp: , Rfl:    TURMERIC PO, Take 538 mg by mouth daily., Disp: , Rfl:    vitamin B-12 (CYANOCOBALAMIN) 1000 MCG tablet, Take 2,000 mcg by mouth daily., Disp: , Rfl:   Physical exam:  Vitals:   10/18/23 1431  BP: 120/70  Pulse: 89  Resp: 19  Temp: 98.8 F (37.1 C)  TempSrc: Tympanic  SpO2: 92%  Weight: 134 lb (60.8 kg)  Height: 5\' 4"  (1.626 m)   Physical Exam Constitutional:      Comments: Sitting in a wheelchair. Appears frail and in distress in pain  Cardiovascular:     Rate and Rhythm: Normal rate and regular rhythm.     Heart sounds: Normal heart sounds.  Pulmonary:     Effort: Pulmonary effort is normal.     Breath sounds: Normal breath sounds.  Skin:    General: Skin is warm and dry.  Neurological:     Mental Status: She is alert and oriented to person, place, and  time.      I have personally reviewed labs listed below:    Latest Ref Rng & Units 06/17/2023    1:58 PM  CMP  Glucose 70 - 99 mg/dL 161   BUN 8 - 27 mg/dL 8   Creatinine 0.96 - 0.45 mg/dL 4.09   Sodium 811 - 914 mmol/L 143   Potassium 3.5 - 5.2 mmol/L 3.6   Chloride 96 - 106 mmol/L 108   CO2 20 - 29 mmol/L 20   Calcium 8.7 - 10.3 mg/dL 9.9   Total Protein 6.0 - 8.5 g/dL 6.6   Total Bilirubin 0.0 - 1.2 mg/dL 1.1   Alkaline Phos 44 - 121 IU/L 103   AST 0 - 40 IU/L 33   ALT 0 - 32 IU/L 16       Latest Ref Rng & Units 08/09/2023    1:55 PM  CBC  WBC 4.0 - 10.5 K/uL 4.6   Hemoglobin 12.0 - 15.0 g/dL 78.2   Hematocrit 95.6 - 46.0 % 34.9   Platelets 150 - 400 K/uL 104       Assessment and plan- Patient is a 83 y.o. female with clinical prognostic stage IIa invasive mammary carcinoma of the left breast cT2 N1 M0 ER/PR positive HER2 negative.  She is here to discuss further management of breast cancer  Patient had surgical consultation with Dr. Charmel Cooter for her locally advanced ER/PR positive HER2 negative disease.  Dr. Charmel Cooter is concerned that if patient does not follow-up with adjuvant radiation following surgery surgery itself will not be curative for her and may not help her in reducing pain and swelling  in her left arm.  Moreover she is high risk for surgery.  Patient at this point wishes to value her quality of life and remain pain-free even if it means foregoing surgery and radiation treatment.  She will continue on tamoxifen as it does offer some degree of palliation by continuing with estrogen blockade and causing a slow shrinkage of her tumor although it is unlikely to make a significant difference in her pain short-term.  We discussed optimizing opioid management for pain control.  I had switched her from hydrocodone to as needed oxycodone and there would be room to add long-acting pain medicines as well as nonnarcotic agents such as gabapentin for pain control.  She will be  meeting withPalliative care NP Melodie Spry [facility] today.  I will see her back in 3 months with CBC ferritin and iron studies   Visit Diagnosis 1. Goals of care, counseling/discussion   2. Malignant neoplasm of upper-inner quadrant of left breast in female, estrogen receptor positive (HCC)   3. Neoplasm related pain      Dr. Seretha Dance, MD, MPH [facility] [phone] 10/19/2023 11:51 AM

## 2023-10-28 ENCOUNTER — Other Ambulatory Visit: Payer: Self-pay

## 2023-10-28 ENCOUNTER — Emergency Department

## 2023-10-28 ENCOUNTER — Emergency Department
Admission: EM | Admit: 2023-10-28 | Discharge: 2023-10-28 | Disposition: A | Discharge status: Home / Self Care | Attending: Emergency Medicine | Admitting: Emergency Medicine

## 2023-10-28 ENCOUNTER — Telehealth: Payer: Self-pay | Admitting: Hospice and Palliative Medicine

## 2023-10-28 ENCOUNTER — Telehealth: Payer: Self-pay | Admitting: *Deleted

## 2023-10-28 ENCOUNTER — Encounter: Payer: Self-pay | Admitting: Emergency Medicine

## 2023-10-28 ENCOUNTER — Other Ambulatory Visit: Payer: Self-pay | Admitting: *Deleted

## 2023-10-28 DIAGNOSIS — R0689 Other abnormalities of breathing: Secondary | ICD-10-CM | POA: Diagnosis not present

## 2023-10-28 DIAGNOSIS — I959 Hypotension, unspecified: Secondary | ICD-10-CM | POA: Diagnosis not present

## 2023-10-28 DIAGNOSIS — N179 Acute kidney failure, unspecified: Secondary | ICD-10-CM | POA: Diagnosis not present

## 2023-10-28 DIAGNOSIS — R000 Tachycardia, unspecified: Secondary | ICD-10-CM | POA: Diagnosis not present

## 2023-10-28 DIAGNOSIS — M79602 Pain in left arm: Secondary | ICD-10-CM | POA: Diagnosis not present

## 2023-10-28 DIAGNOSIS — M7989 Other specified soft tissue disorders: Secondary | ICD-10-CM | POA: Diagnosis not present

## 2023-10-28 DIAGNOSIS — M5030 Other cervical disc degeneration, unspecified cervical region: Secondary | ICD-10-CM | POA: Diagnosis not present

## 2023-10-28 DIAGNOSIS — R7402 Elevation of levels of lactic acid dehydrogenase (LDH): Secondary | ICD-10-CM | POA: Diagnosis not present

## 2023-10-28 DIAGNOSIS — I10 Essential (primary) hypertension: Secondary | ICD-10-CM | POA: Diagnosis not present

## 2023-10-28 DIAGNOSIS — M47812 Spondylosis without myelopathy or radiculopathy, cervical region: Secondary | ICD-10-CM | POA: Diagnosis not present

## 2023-10-28 DIAGNOSIS — M79601 Pain in right arm: Secondary | ICD-10-CM | POA: Diagnosis not present

## 2023-10-28 DIAGNOSIS — M4802 Spinal stenosis, cervical region: Secondary | ICD-10-CM | POA: Diagnosis not present

## 2023-10-28 DIAGNOSIS — C50919 Malignant neoplasm of unspecified site of unspecified female breast: Secondary | ICD-10-CM | POA: Diagnosis not present

## 2023-10-28 DIAGNOSIS — R079 Chest pain, unspecified: Secondary | ICD-10-CM | POA: Diagnosis not present

## 2023-10-28 LAB — COMPREHENSIVE METABOLIC PANEL WITH GFR
ALT: 12 U/L (ref 0–44)
AST: 28 U/L (ref 15–41)
Albumin: 3.2 g/dL — ABNORMAL LOW (ref 3.5–5.0)
Alkaline Phosphatase: 48 U/L (ref 38–126)
Anion gap: 8 (ref 5–15)
BUN: 17 mg/dL (ref 8–23)
CO2: 26 mmol/L (ref 22–32)
Calcium: 9.4 mg/dL (ref 8.9–10.3)
Chloride: 104 mmol/L (ref 98–111)
Creatinine, Ser: 1.23 mg/dL — ABNORMAL HIGH (ref 0.44–1.00)
GFR, Estimated: 44 mL/min — ABNORMAL LOW (ref >60)
Glucose, Bld: 213 mg/dL — ABNORMAL HIGH (ref 70–99)
Potassium: 4 mmol/L (ref 3.5–5.1)
Sodium: 138 mmol/L (ref 135–145)
Total Bilirubin: 2 mg/dL — ABNORMAL HIGH (ref 0.0–1.2)
Total Protein: 6.4 g/dL — ABNORMAL LOW (ref 6.5–8.1)

## 2023-10-28 LAB — CBC WITH DIFFERENTIAL/PLATELET
Abs Immature Granulocytes: 0.02 10*3/uL (ref 0.00–0.07)
Basophils Absolute: 0 10*3/uL (ref 0.0–0.1)
Basophils Relative: 0 %
Eosinophils Absolute: 0.2 10*3/uL (ref 0.0–0.5)
Eosinophils Relative: 4 %
HCT: 32.7 % — ABNORMAL LOW (ref 36.0–46.0)
Hemoglobin: 10.7 g/dL — ABNORMAL LOW (ref 12.0–15.0)
Immature Granulocytes: 0 %
Lymphocytes Relative: 15 %
Lymphs Abs: 0.7 10*3/uL (ref 0.7–4.0)
MCH: 32.7 pg (ref 26.0–34.0)
MCHC: 32.7 g/dL (ref 30.0–36.0)
MCV: 100 fL (ref 80.0–100.0)
Monocytes Absolute: 0.4 10*3/uL (ref 0.1–1.0)
Monocytes Relative: 9 %
Neutro Abs: 3.2 10*3/uL (ref 1.7–7.7)
Neutrophils Relative %: 72 %
Platelets: 78 10*3/uL — ABNORMAL LOW (ref 150–400)
RBC: 3.27 MIL/uL — ABNORMAL LOW (ref 3.87–5.11)
RDW: 15.1 % (ref 11.5–15.5)
WBC: 4.5 10*3/uL (ref 4.0–10.5)
nRBC: 0 % (ref 0.0–0.2)

## 2023-10-28 LAB — LACTIC ACID, PLASMA: Lactic Acid, Venous: 2.5 mmol/L (ref 0.5–1.9)

## 2023-10-28 LAB — CK: Total CK: 35 U/L — ABNORMAL LOW (ref 38–234)

## 2023-10-28 MED ORDER — HYDROMORPHONE HCL 2 MG PO TABS: 1.0000 mg | ORAL_TABLET | ORAL | 0 refills | Status: DC | PRN

## 2023-10-28 MED ORDER — HYDROMORPHONE HCL 1 MG/ML IJ SOLN
1.0000 mg | Freq: Once | INTRAMUSCULAR | Status: AC
  Administered 2023-10-28: 1 mg via INTRAVENOUS
  Filled 2023-10-28: qty 1

## 2023-10-28 MED ORDER — LACTATED RINGERS IV BOLUS
1000.0000 mL | Freq: Once | INTRAVENOUS | Status: AC
  Administered 2023-10-28: 1000 mL via INTRAVENOUS

## 2023-10-28 NOTE — ED Notes (Signed)
 Pt gone to CT

## 2023-10-28 NOTE — ED Triage Notes (Signed)
 Recent biopsy left breast and since has complained of pain and swelling in left arm and radiates up to neck and down toward legs.  EKG paced rhythm   T99.4  BS 233 HR 95 94% 114/58

## 2023-10-28 NOTE — Telephone Encounter (Signed)
 Spoke with patient's son, Emily Faulkner.  Patient was seen in the emergency department this morning for ongoing left arm pain.  Patient has not had significant improvement with liberalized dosing of oxycodone  or initiation of gabapentin .  She did receive dose of hydromorphone  1 mg IV x 1 and reported significant improvement in pain.  Patient and family have requested rotation to oral hydromorphone .  Of note, repeat US  was negative for DVT.  CT cervical spine showed multilevel degenerative changes.  Son states that family is still considering option of hospice.  He says that his brother is in town and they will discuss further.  Plan: -DC oxycodone  -Start oral hydromorphone  1 mg every 4 hours as needed for breakthrough pain -Daily bowel regimen -Continue gabapentin  -Follow-up telephone call later this week

## 2023-10-28 NOTE — ED Notes (Signed)
 CRITICAL VALUE STICKER  CRITICAL VALUE: Lactic Acid 2.5  MD NOTIFIED: Dr Karlynn Oyster  TIME OF NOTIFICATION: 10/28/23 0735  RESPONSE:  Verbally Acknowledged

## 2023-10-28 NOTE — ED Provider Notes (Signed)
 [facility] Provider Note    Event Date/Time   First MD Initiated Contact with Patient 10/28/23 0700     (approximate)   History   Arm Swelling   HPI Emily Faulkner is a 83 y.o. female with recent left breast biopsy presenting today for left arm swelling.  Patient had lymph node biopsy of her left breast approximately 2 months ago when evaluating her for breast cancer.  Since then she has had pain and swelling in her left upper extremity.  She has been trialed on multiple pain medications with no significant improvement and continued to have swelling.  Denies any redness to the area.  Denies fevers or chills.  Also complaining of neck pain.  No recent falls to the arm.  Chart review: Patient seen on 10/18/2023 by oncology team for similar left arm pain.  Had a recent ultrasound of left upper extremity which showed no evidence of DVT.  Has been working on using long-acting opioids and recently started on gabapentin .     Physical Exam   Triage Vital Signs: ED Triage Vitals  Encounter Vitals Group     BP 10/28/23 0639 (!) 109/53     Systolic BP Percentile --      Diastolic BP Percentile --      Pulse Rate 10/28/23 0636 86     Resp 10/28/23 0636 17     Temp 10/28/23 0633 99.1 F (37.3 C)     Temp Source 10/28/23 0633 Oral     SpO2 10/28/23 0636 98 %     Weight 10/28/23 0639 134 lb 11.2 oz (61.1 kg)     Height 10/28/23 0639 5\' 4"  (1.626 m)     Head Circumference --      Peak Flow --      Pain Score --      Pain Loc --      Pain Education --      Exclude from Growth Chart --     Most recent vital signs: Vitals:   10/28/23 0800 10/28/23 0830  BP: 125/62 (!) 120/57  Pulse: 78 81  Resp: (!) 22 20  Temp:    SpO2: 93% 97%   I have reviewed the vital signs. General:  Awake, alert, no acute distress. Head:  Normocephalic, Atraumatic. EENT:  PERRL, EOMI, Oral mucosa pink and moist, Neck is supple. Cardiovascular: Regular rate, 2+ distal  pulses. Respiratory:  Normal respiratory effort, symmetrical expansion, no distress.   Extremities:  Moving all four extremities through full ROM without pain.  Swelling throughout left upper extremity with no pitting edema no erythema present.  Mild tenderness palpation throughout. Neuro:  Alert and oriented.  Interacting appropriately.   Skin:  Warm, dry, no rash.   Psych: Appropriate affect.    ED Results / Procedures / Treatments   Labs (all labs ordered are listed, but only abnormal results are displayed) Labs Reviewed  COMPREHENSIVE METABOLIC PANEL WITH GFR - Abnormal; Notable for the following components:      Result Value   Glucose, Bld 213 (*)    Creatinine, Ser 1.23 (*)    Total Protein 6.4 (*)    Albumin 3.2 (*)    Total Bilirubin 2.0 (*)    GFR, Estimated 44 (*)    All other components within normal limits  CBC WITH DIFFERENTIAL/PLATELET - Abnormal; Notable for the following components:   RBC 3.27 (*)    Hemoglobin 10.7 (*)    HCT 32.7 (*)  Platelets 78 (*)    All other components within normal limits  CK - Abnormal; Notable for the following components:   Total CK 35 (*)    All other components within normal limits  LACTIC ACID, PLASMA - Abnormal; Notable for the following components:   Lactic Acid, Venous 2.5 (*)    All other components within normal limits     EKG    RADIOLOGY Independently interpreted left upper extremity ultrasound and CT cervical spine with no acute pathology   PROCEDURES:  Critical Care performed: No  Procedures   MEDICATIONS ORDERED IN ED: Medications  HYDROmorphone  (DILAUDID ) injection 1 mg (1 mg Intravenous Given 10/28/23 0808)  lactated ringers  bolus 1,000 mL (1,000 mLs Intravenous New Bag/Given 10/28/23 0811)     IMPRESSION / MDM / ASSESSMENT AND PLAN / ED COURSE  I reviewed the triage vital signs and the nursing notes.                              Differential diagnosis includes, but is not limited to, lymphatic  system obstruction, DVT, cellulitis, metastatic spread  Patient's presentation is most consistent with exacerbation of chronic illness.  Patient is an 83 year old female presenting today for left arm pain and swelling.  Exam does show swelling throughout her left arm in comparison to the right side.  There is no significant erythema and mostly mild tenderness palpation.  No recent falls to warrant x-rays to any of the areas.  Will repeat ultrasound to evaluate for DVT.  Other concern is there may be obstruction in the lymphatic system since she did have a lymph node biopsy.  Will also rule out any signs of infection although not immediately evident on laboratory workup or vital signs.  Ultrasound shows no evidence of DVT.  CT cervical spine with no evidence of nerve impingement but does have degenerative disc disease.  Laboratory workup otherwise reassuring.  Mild lactic acid elevation but suspect this is in the setting of decreased p.o. intake.  Unsure if this is pain related to active cancer versus possible lymphatic obstruction given recent biopsy.  Otherwise do not see any emergent pathology and she is feeling better after dose of Dilaudid .  Will discharge and have her call her palliative care team for ongoing outpatient management.  The patient is on the cardiac monitor to evaluate for evidence of arrhythmia and/or significant heart rate changes. Clinical Course as of 10/28/23 0950  Mon Oct 28, 2023  0904 CT Cervical Spine Wo Contrast Chronic degenerative disc disease [DW]    Clinical Course User Index [DW] Kandee Orion, MD     FINAL CLINICAL IMPRESSION(S) / ED DIAGNOSES   Final diagnoses:  Left arm pain  Left arm swelling     Rx / DC Orders   ED Discharge Orders     None        Note:  This document was prepared using Dragon voice recognition software and may include unintentional dictation errors.   Kandee Orion, MD 10/28/23 [phone]

## 2023-10-28 NOTE — Discharge Instructions (Signed)
 Please call your palliative care team for continuing ongoing outpatient evaluation of your pain symptoms.

## 2023-10-28 NOTE — Telephone Encounter (Signed)
 Son called and [facility] had the med and I spoke to [place] and put another to [facility] where they have the meds. And son I spoke to that he will hitting the button to send the rx to [facility] in 20 min, he is good with that

## 2023-10-28 NOTE — Telephone Encounter (Signed)
 Her son Goble Last called and wanted to speak to [employer], Jerrilyn Moras about the pain for the patient. The meds that she has at home did not help the pain at all and she is in hospital-ER. He says the med that she is getting in hospital is helping and he would like to speak to [facility] about getting that med to go on with. I looked in the ER and it was dilaudid  1mg . The sone phone number [phone]

## 2023-11-01 ENCOUNTER — Inpatient Hospital Stay: Admitting: Hospice and Palliative Medicine

## 2023-11-01 DIAGNOSIS — Z170 Estrogen receptor positive status [ER+]: Secondary | ICD-10-CM

## 2023-11-01 DIAGNOSIS — G893 Neoplasm related pain (acute) (chronic): Secondary | ICD-10-CM | POA: Diagnosis not present

## 2023-11-01 DIAGNOSIS — Z515 Encounter for palliative care: Secondary | ICD-10-CM

## 2023-11-01 DIAGNOSIS — C50412 Malignant neoplasm of upper-outer quadrant of left female breast: Secondary | ICD-10-CM | POA: Diagnosis not present

## 2023-11-01 MED ORDER — HYDROMORPHONE HCL 2 MG PO TABS: 2.0000 mg | ORAL_TABLET | ORAL | 0 refills | Status: DC | PRN

## 2023-11-01 NOTE — Progress Notes (Signed)
 Virtual Visit via Telephone Note  I connected with Emily Faulkner on 11/01/23 at  3:00 PM EDT by telephone and verified that I am speaking with the correct person using two identifiers.  Location: Patient: Home Provider: Clinic   I discussed the limitations, risks, security and privacy concerns of performing an evaluation and management service by telephone and the availability of in person appointments. I also discussed with the patient that there may be a patient responsible charge related to this service. The patient expressed understanding and agreed to proceed.   History of Present Illness: Emily Faulkner is a 83 y.o. female with multiple medical problems including CKD stage III, hypertension, diabetes, a left renal mass, IDA secondary to AVMs, history of right breast cancer status post right mastectomy and 5 years of tamoxifen , now with left ER/PR, HER2 negative breast cancer.  Patient has had significant left arm pain.  She was referred to palliative care to address goals and manage ongoing symptoms.    Observations/Objective: I called and spoke with patient and son Goble Last) by phone.  Patient continues to endorse poorly controlled pain.  She states that initially, she thought the oral hydromorphone  0.5 tablet with helping but does not feel like it is as effective now.  She denies any loopiness or adverse effects.  I instructed patient to increase to a full tablet (2 mg) every 4 hours as needed.  Patient and son are interested in having a hospice informational visit to explore home resources.  Will send referral.  Assessment and Plan: Breast Cancer -on tamoxifen .  Patient not interested in aggressive cancer treatment.  She would like to explore hospice involvement at home.  Will send referral.  Neoplasm related pain -increase oral hydromorphone  2 mg every 4 hours as needed #60  Follow Up Instructions: Follow-up telephone visit 2 to 3 weeks   I discussed the assessment and treatment  plan with the patient. The patient was provided an opportunity to ask questions and all were answered. The patient agreed with the plan and demonstrated an understanding of the instructions.   The patient was advised to call back or seek an in-person evaluation if the symptoms worsen or if the condition fails to improve as anticipated.  I provided 10 minutes of non-face-to-face time during this encounter.   Peggyann Bower, NP

## 2023-11-08 ENCOUNTER — Inpatient Hospital Stay: Payer: Medicare HMO | Attending: Oncology

## 2023-11-11 ENCOUNTER — Ambulatory Visit: Admitting: Hospice and Palliative Medicine

## 2023-11-11 ENCOUNTER — Telehealth: Payer: Self-pay | Admitting: *Deleted

## 2023-11-11 DIAGNOSIS — Z170 Estrogen receptor positive status [ER+]: Secondary | ICD-10-CM

## 2023-11-11 DIAGNOSIS — Z515 Encounter for palliative care: Secondary | ICD-10-CM | POA: Diagnosis not present

## 2023-11-11 DIAGNOSIS — G893 Neoplasm related pain (acute) (chronic): Secondary | ICD-10-CM

## 2023-11-11 DIAGNOSIS — C50412 Malignant neoplasm of upper-outer quadrant of left female breast: Secondary | ICD-10-CM

## 2023-11-11 MED ORDER — HYDROMORPHONE HCL 4 MG PO TABS: 4.0000 mg | ORAL_TABLET | ORAL | 0 refills | Status: DC | PRN

## 2023-11-11 NOTE — Progress Notes (Signed)
 Virtual Visit via Telephone Note  I connected with Emily Faulkner on 11/11/23 at  2:00 PM EDT by telephone and verified that I am speaking with the correct person using two identifiers.  Location: Patient: Home Provider: Clinic   I discussed the limitations, risks, security and privacy concerns of performing an evaluation and management service by telephone and the availability of in person appointments. I also discussed with the patient that there may be a patient responsible charge related to this service. The patient expressed understanding and agreed to proceed.   History of Present Illness: Emily Faulkner is a 83 y.o. female with multiple medical problems including CKD stage III, hypertension, diabetes, a left renal mass, IDA secondary to AVMs, history of right breast cancer status post right mastectomy and 5 years of tamoxifen , now with left ER/PR, HER2 negative breast cancer.  Patient has had significant left arm pain.  She was referred to palliative care to address goals and manage ongoing symptoms.    Observations/Objective: I called and spoke with patient and son Emily Faulkner) by phone.    Patient continues to endorse poorly controlled pain in the left upper extremity.  This is presumed to be secondary to her known lymphedema.  She has had evaluation by dopplers, which were negative for DVT.  She is also been seen twice in the emergency department for same with negative workup.  Patient has not been interested in pursuing cancer treatments other than tamoxifen .  Her primary goal is improve comfort and quality of life.  Patient and family were interested in hospice.  However, hospice declined enrollment as patient wanted to continue tamoxifen .  Son says that family and patient are discussing possible discontinuation of tamoxifen  and referral back for hospice care.  Symptomatically, her pain was improved initially after dose increase in hydromorphone .  However, patient no longer feels that  hydromorphone  is as effective.  We discussed further liberalization of dosing to 4 mg every 4 hours as needed.  Also spoke with vascular who agreed to evaluate patient.   Assessment and Plan: Breast Cancer -on tamoxifen .  Patient not interested in aggressive cancer treatment.  Patient/family are considering discontinuing tamoxifen  and referring back for hospice care.  Patient pending initiation of community palliative care.  LUE edema -most likely secondary to lymphedema.  Referral to vascular - Dr. Vonna Guardian  Neoplasm related pain -increase oral hydromorphone  4 mg every 4 hours as needed #60  Follow Up Instructions: Follow-up telephone visit 2 to 3 weeks   I discussed the assessment and treatment plan with the patient. The patient was provided an opportunity to ask questions and all were answered. The patient agreed with the plan and demonstrated an understanding of the instructions.   The patient was advised to call back or seek an in-person evaluation if the symptoms worsen or if the condition fails to improve as anticipated.  I provided 15 minutes of non-face-to-face time during this encounter.   Peggyann Bower, NP

## 2023-11-11 NOTE — Telephone Encounter (Signed)
 10 he called saying he had some issues about the pain with her arm as where is the biopsy that was done 2 months ago.

## 2023-11-12 ENCOUNTER — Other Ambulatory Visit: Payer: Self-pay | Admitting: *Deleted

## 2023-11-12 MED ORDER — HYDROMORPHONE HCL 4 MG PO TABS: 4.0000 mg | ORAL_TABLET | ORAL | 0 refills | Status: DC | PRN

## 2023-11-12 NOTE — Telephone Encounter (Signed)
 [facility] in [place] does not have the Dilaudid  but [facility] does and from now on , the son says that they are going to slowly change all of their medicines to [facility]

## 2023-11-15 ENCOUNTER — Ambulatory Visit (INDEPENDENT_AMBULATORY_CARE_PROVIDER_SITE_OTHER): Admitting: Vascular Surgery

## 2023-11-15 VITALS — BP 138/86 | HR 78 | Resp 18

## 2023-11-15 DIAGNOSIS — E785 Hyperlipidemia, unspecified: Secondary | ICD-10-CM

## 2023-11-15 DIAGNOSIS — I890 Lymphedema, not elsewhere classified: Secondary | ICD-10-CM

## 2023-11-15 DIAGNOSIS — I10 Essential (primary) hypertension: Secondary | ICD-10-CM

## 2023-11-15 NOTE — Progress Notes (Unsigned)
 Subjective:    Patient ID: Emily Faulkner, female    DOB: [birth year], 83 y.o.   MRN: [id number] Chief Complaint  Patient presents with  . Establish Care    HPI  Review of Systems  Constitutional:  Positive for appetite change and unexpected weight change.  Eyes: Negative.   Respiratory:  Positive for shortness of breath.        Positive labored breathing  Cardiovascular: Negative.   Gastrointestinal: Negative.   Genitourinary: Negative.   Musculoskeletal: Negative.   Skin:  Positive for color change.       Left Hand and forearm purple  Neurological:  Positive for weakness.       Generalized weakness  Psychiatric/Behavioral: Negative.    All other systems reviewed and are negative.      Objective:    Physical Exam Constitutional:      Appearance: She is ill-appearing and toxic-appearing.  Eyes:     Pupils: Pupils are equal, round, and reactive to light.  Cardiovascular:     Rate and Rhythm: Regular rhythm. Tachycardia present.     Pulses: Normal pulses.     Heart sounds: Normal heart sounds.  Pulmonary:     Effort: Accessory muscle usage present.     Breath sounds: Examination of the left-upper field reveals decreased breath sounds. Examination of the left-middle field reveals decreased breath sounds. Examination of the left-lower field reveals decreased breath sounds and rales. Decreased breath sounds and rales present.  Abdominal:     General: There is distension.  Musculoskeletal:        General: Swelling and tenderness present.     Comments: Lymphedema  left upper extremity +3 - +4 Hx of breast cancer and Lymph node biopsy.     BP 138/86 (BP Location: Right Leg, Patient Position: Sitting, Cuff Size: Small)   Pulse 78   Resp 18   Past Medical History:  Diagnosis Date  . Acute kidney failure (HCC) 11/16/2020  . AKI (acute kidney injury) (HCC) 03/17/2018  . Anemia   . Cancer [facility])    breast cancer right mastectomy  . Diabetes mellitus without complication  (HCC)   . GIB (gastrointestinal bleeding) 02/20/2018  . Glaucoma   . Hyperkalemia 10/24/2020  . Hyperlipidemia   . Hypertension   . Incontinence of bowel   . Neuropathy   . Odynophagia 10/24/2020  . Osteoarthritis   . Presence of permanent cardiac pacemaker   . Rheumatoid arthritis (HCC)   . Syncope and collapse 10/24/2020  . Telangiectasias     Social History   Socioeconomic History  . Marital status: Widowed    Spouse name: Not on file  . Number of children: Not on file  . Years of education: Not on file  . Highest education level: Not on file  Occupational History  . Not on file  Tobacco Use  . Smoking status: Former    Current packs/day: 0.00    Types: Cigarettes    Quit date: 07/10/2011    Years since quitting: 12.3  . Smokeless tobacco: Never  Vaping Use  . Vaping status: Never Used  Substance and Sexual Activity  . Alcohol use: No  . Drug use: No  . Sexual activity: Not Currently  Other Topics Concern  . Not on file  Social History Narrative  . Not on file   Social Drivers of Health   Financial Resource Strain: Low Risk  (09/12/2023)   Received from [facility]   Overall Financial Resource  Strain (CARDIA)   . Difficulty of Paying Living Expenses: Not hard at all  Food Insecurity: No Food Insecurity (09/12/2023)   Received from [facility]   Hunger Vital Sign   . Worried About Programme researcher, broadcasting/film/video in the Last Year: Never true   . Ran Out of Food in the Last Year: Never true  Transportation Needs: No Transportation Needs (09/12/2023)   Received from [facility]   [facility] - Transportation   . In the past 12 months, has lack of transportation kept you from medical appointments or from getting medications?: No   . Lack of Transportation (Non-Medical): No  Physical Activity: Not on file  Stress: Not on file  Social Connections: Not on file  Intimate Partner Violence: Not At Risk (05/28/2023)   Humiliation,  Afraid, Rape, and Kick questionnaire   . Fear of Current or Ex-Partner: No   . Emotionally Abused: No   . Physically Abused: No   . Sexually Abused: No    Past Surgical History:  Procedure Laterality Date  . BREAST BIOPSY Left 08/21/2023   US  LT BREAST BX W LOC DEV 1ST LESION IMG BX SPEC US  GUIDE 08/21/2023 [facility]  . BREAST BIOPSY Left 08/21/2023   US  LT BREAST BX W LOC DEV EA ADD LESION IMG BX SPEC US  GUIDE 08/21/2023 [facility]  . BREAST SURGERY     right mastectomy  . CHOLECYSTECTOMY    . COLONOSCOPY    . COLONOSCOPY WITH PROPOFOL  N/A 08/14/2016   Procedure: COLONOSCOPY WITH PROPOFOL ;  Surgeon: Deveron Fly, MD;  Location: [facility] ENDOSCOPY;  Service: Endoscopy;  Laterality: N/A;  . COLONOSCOPY WITH PROPOFOL  N/A 02/22/2018   Procedure: COLONOSCOPY WITH PROPOFOL ;  Surgeon: Luke Salaam, MD;  Location: [facility] ENDOSCOPY;  Service: Gastroenterology;  Laterality: N/A;  . ENTEROSCOPY N/A 04/12/2021   Procedure: ENTEROSCOPY;  Surgeon: Selena Daily, MD;  Location: [facility] ENDOSCOPY;  Service: Gastroenterology;  Laterality: N/A;  push  . ESOPHAGOGASTRODUODENOSCOPY (EGD) WITH PROPOFOL  N/A 08/14/2016   Procedure: ESOPHAGOGASTRODUODENOSCOPY (EGD) WITH PROPOFOL ;  Surgeon: Deveron Fly, MD;  Location: [facility] ENDOSCOPY;  Service: Endoscopy;  Laterality: N/A;  . ESOPHAGOGASTRODUODENOSCOPY (EGD) WITH PROPOFOL  N/A 01/30/2018   Procedure: ESOPHAGOGASTRODUODENOSCOPY (EGD) WITH PROPOFOL ;  Surgeon: Toledo, Alphonsus Jeans, MD;  Location: [facility] ENDOSCOPY;  Service: Gastroenterology;  Laterality: N/A;  . ESOPHAGOGASTRODUODENOSCOPY (EGD) WITH PROPOFOL  N/A 02/22/2018   Procedure: ESOPHAGOGASTRODUODENOSCOPY (EGD) WITH PROPOFOL ;  Surgeon: Luke Salaam, MD;  Location: [facility] ENDOSCOPY;  Service: Gastroenterology;  Laterality: N/A;  . EXCISIONAL HEMORRHOIDECTOMY    . IR KYPHO THORACIC WITH BONE BIOPSY  05/14/2023  . IR RADIOLOGIST EVAL & MGMT  08/07/2022  . IR RADIOLOGIST EVAL & MGMT  09/12/2022  . IR RADIOLOGIST  EVAL & MGMT  05/02/2023  . IR RADIOLOGIST EVAL & MGMT  05/02/2023  . IR RADIOLOGIST EVAL & MGMT  05/23/2023  . KYPHOPLASTY N/A 03/20/2018   Procedure: [id number];  Surgeon: Molli Angelucci, MD;  Location: [facility] ORS;  Service: Orthopedics;  Laterality: N/A;  . PACEMAKER IMPLANT N/A 10/26/2020   Procedure: PACEMAKER IMPLANT;  Surgeon: Percival Brace, MD;  Location: [facility] INVASIVE CV LAB;  Service: Cardiovascular;  Laterality: N/A;  . RADIOLOGY WITH ANESTHESIA N/A 08/31/2022   Procedure: CT renal microwave ablation;  Surgeon: Oval Blossom, MD;  Location: [facility] OR;  Service: Radiology;  Laterality: N/A;    Family History  Problem Relation Age of Onset  . Kidney cancer Son   . Bladder Cancer Neg Hx   . Breast cancer  Neg Hx     Allergies  Allergen Reactions  . Amoxicillin  Diarrhea       Latest Ref Rng & Units 10/28/2023    6:44 AM 08/09/2023    1:55 PM 06/17/2023    1:58 PM  CBC  WBC 4.0 - 10.5 K/uL 4.5  4.6  4.7   Hemoglobin 12.0 - 15.0 g/dL 56.2  13.0  86.5   Hematocrit 36.0 - 46.0 % 32.7  34.9  37.8   Platelets 150 - 400 K/uL 78  104  124        CMP     Component Value Date/Time   NA 138 10/28/2023 0644   NA 143 06/17/2023 1358   K 4.0 10/28/2023 0644   CL 104 10/28/2023 0644   CO2 26 10/28/2023 0644   GLUCOSE 213 (H) 10/28/2023 0644   BUN 17 10/28/2023 0644   BUN 8 06/17/2023 1358   CREATININE 1.23 (H) 10/28/2023 0644   CREATININE 1.46 (H) 05/02/2023 1600   CALCIUM  9.4 10/28/2023 0644   PROT 6.4 (L) 10/28/2023 0644   PROT 6.6 06/17/2023 1358   ALBUMIN 3.2 (L) 10/28/2023 0644   ALBUMIN 3.8 06/17/2023 1358   AST 28 10/28/2023 0644   ALT 12 10/28/2023 0644   ALKPHOS 48 10/28/2023 0644   BILITOT 2.0 (H) 10/28/2023 0644   BILITOT 1.1 06/17/2023 1358   EGFR 46 (L) 06/17/2023 1358   GFRNONAA 44 (L) 10/28/2023 0644     No results found.     Assessment & Plan:   1. Lymphedema of left upper extremity (Primary) ***  2. Hypertension, unspecified  type Continue antihypertensive medications as already ordered, these medications have been reviewed and there are no changes at this time.  3. Hyperlipidemia, unspecified hyperlipidemia type Continue statin as ordered and reviewed, no changes at this time   Current Outpatient Medications on File Prior to Visit  Medication Sig Dispense Refill  . ACCU-CHEK AVIVA PLUS test strip     . ACCU-CHEK SOFTCLIX LANCETS lancets once daily Use as instructed.    Aaron Aas alendronate (FOSAMAX) 70 MG tablet TAKE 1 TABLET BY MOUTH EVERY 7DAYS WITH A FULL GLASS OF WATER . DO NOT LIE DOWN FOR 30 MINUTES    . Biotin 1000 MCG tablet Take 1,000 mcg by mouth daily.    . Calcium  Carb-Cholecalciferol  (OYSTER SHELL CALCIUM  W/D) 500-5 MG-MCG TABS Take 1 tablet by mouth daily.    . Cholecalciferol  50 MCG (2000 UT) CAPS Take 2,000 Units by mouth daily.    . HYDROmorphone  (DILAUDID ) 4 MG tablet Take 1 tablet (4 mg total) by mouth every 4 (four) hours as needed for severe pain (pain score 7-10). 60 tablet 0  . insulin  glargine (LANTUS ) 100 UNIT/ML Solostar Pen Inject 15 Units into the skin daily. (may take up to 50u based upon blood glucose reading) (Patient taking differently: Inject 28 Units into the skin daily. (may take up to 50u based upon blood glucose reading)) 15 mL 11  . losartan  (COZAAR ) 50 MG tablet Take 25 mg by mouth daily.    . magnesium  gluconate (MAGONATE) 500 (27 Mg) MG TABS tablet Take 400 mg by mouth in the morning and at bedtime.    . niacin  500 MG CR capsule Take 500 mg by mouth at bedtime.    . omeprazole  (PRILOSEC) 40 MG capsule TAKE 1 CAPSULE BY MOUTH EVERY DAY BEFORE BREAKFAST 30 capsule 0  . Probiotic Product (PROBIOTIC PO) Take 1 tablet by mouth daily. 2 billion    .  simvastatin  (ZOCOR ) 20 MG tablet Take 20 mg by mouth at bedtime.  6  . sitaGLIPtin (JANUVIA) 50 MG tablet Take 50 mg by mouth daily.    . vitamin B-12 (CYANOCOBALAMIN ) 1000 MCG tablet Take 2,000 mcg by mouth daily.    . feeding supplement,  ENSURE ENLIVE, (ENSURE ENLIVE) LIQD Take 237 mLs by mouth 2 (two) times daily between meals. (Patient not taking: Reported on 11/15/2023) 237 mL 12  . gabapentin  (NEURONTIN ) 100 MG capsule Take 1 capsule (100 mg total) by mouth 3 (three) times daily. (Patient not taking: Reported on 11/15/2023) 90 capsule 0  . Insulin  Pen Needle 32G X 4 MM MISC USE 1 NEEDLE SUBCUTANEOUSLY ONCE A DAY AS DIRECTED (Patient not taking: Reported on 11/15/2023)    . tamoxifen  (NOLVADEX ) 20 MG tablet Take 1 tablet (20 mg total) by mouth daily. (Patient not taking: Reported on 11/15/2023) 30 tablet 3  . TRAVATAN Z 0.004 % SOLN ophthalmic solution Place 1 drop into both eyes at bedtime. (Patient not taking: Reported on 11/15/2023)    . TURMERIC PO Take 538 mg by mouth daily. (Patient not taking: Reported on 11/15/2023)     No current facility-administered medications on file prior to visit.    There are no Patient Instructions on file for this visit. No follow-ups on file.   Annamaria Barrette, NP

## 2023-11-18 ENCOUNTER — Telehealth: Payer: Self-pay | Admitting: *Deleted

## 2023-11-18 DIAGNOSIS — C50412 Malignant neoplasm of upper-outer quadrant of left female breast: Secondary | ICD-10-CM

## 2023-11-18 NOTE — Telephone Encounter (Signed)
Merrily Pew can you please look into this?

## 2023-11-18 NOTE — Telephone Encounter (Signed)
 Goble Last the son says that he would like a call back sometime today if possible so he can go over all the things that are happening with her

## 2023-11-18 NOTE — Telephone Encounter (Signed)
 I spoke with patient's son, Goble Last.  Patient was seen by vascular.  Per family, there was concern that patient's tamoxifen  might be contributing to her edema/pain.  Conservative measures were recommended.  However, her son, patient and family would like to discontinue tamoxifen  and pursue hospice care.  New referral sent.

## 2023-11-18 NOTE — Telephone Encounter (Signed)
 I spoke again with hospice MD - Dr. Michal Agar. He agreed with hospice referral.

## 2023-11-19 ENCOUNTER — Encounter (INDEPENDENT_AMBULATORY_CARE_PROVIDER_SITE_OTHER): Payer: Self-pay | Admitting: Vascular Surgery

## 2023-11-22 ENCOUNTER — Telehealth: Admitting: Hospice and Palliative Medicine

## 2023-11-25 ENCOUNTER — Inpatient Hospital Stay: Admitting: Hospice and Palliative Medicine

## 2023-11-25 DIAGNOSIS — C50412 Malignant neoplasm of upper-outer quadrant of left female breast: Secondary | ICD-10-CM

## 2023-11-25 DIAGNOSIS — Z170 Estrogen receptor positive status [ER+]: Secondary | ICD-10-CM

## 2023-11-25 NOTE — Progress Notes (Signed)
 Spoke with patient son, Goble Last.  Patient was enrolled in hospice last week.  Reportedly, hospice is now managing patient's pain medications.  Son denies any significant changes or concerns today.  Son requested that we cancel follow-up oncology appointments.  He says he will let us  know if anything changes in the future.

## 2023-11-26 ENCOUNTER — Encounter (INDEPENDENT_AMBULATORY_CARE_PROVIDER_SITE_OTHER): Payer: Self-pay

## 2024-01-07 DEATH — deceased

## 2024-02-07 ENCOUNTER — Ambulatory Visit: Payer: Medicare HMO | Admitting: Oncology

## 2024-02-07 ENCOUNTER — Other Ambulatory Visit: Payer: Medicare HMO
